# Patient Record
Sex: Male | Born: 1935
Health system: Southern US, Community
[De-identification: ages and names within clinical notes are randomized; demographics above are authoritative.]

## PROBLEM LIST (undated history)

## (undated) DIAGNOSIS — M48061 Spinal stenosis, lumbar region without neurogenic claudication: Secondary | ICD-10-CM

## (undated) DIAGNOSIS — I499 Cardiac arrhythmia, unspecified: Secondary | ICD-10-CM

## (undated) DIAGNOSIS — I2510 Atherosclerotic heart disease of native coronary artery without angina pectoris: Secondary | ICD-10-CM

## (undated) DIAGNOSIS — Z8719 Personal history of other diseases of the digestive system: Secondary | ICD-10-CM

## (undated) DIAGNOSIS — I10 Essential (primary) hypertension: Secondary | ICD-10-CM

## (undated) DIAGNOSIS — F329 Major depressive disorder, single episode, unspecified: Secondary | ICD-10-CM

## (undated) DIAGNOSIS — G2581 Restless legs syndrome: Secondary | ICD-10-CM

## (undated) DIAGNOSIS — M1990 Unspecified osteoarthritis, unspecified site: Secondary | ICD-10-CM

## (undated) DIAGNOSIS — K219 Gastro-esophageal reflux disease without esophagitis: Secondary | ICD-10-CM

## (undated) DIAGNOSIS — E7800 Pure hypercholesterolemia, unspecified: Secondary | ICD-10-CM

## (undated) DIAGNOSIS — F32A Depression, unspecified: Secondary | ICD-10-CM

## (undated) DIAGNOSIS — T8859XA Other complications of anesthesia, initial encounter: Secondary | ICD-10-CM

## (undated) DIAGNOSIS — T4145XA Adverse effect of unspecified anesthetic, initial encounter: Secondary | ICD-10-CM

## (undated) HISTORY — DX: Spinal stenosis, lumbar region without neurogenic claudication: M48.061

## (undated) HISTORY — PX: COLONOSCOPY: SHX174

## (undated) HISTORY — DX: Restless legs syndrome: G25.81

## (undated) HISTORY — DX: Pure hypercholesterolemia, unspecified: E78.00

## (undated) HISTORY — DX: Essential (primary) hypertension: I10

## (undated) HISTORY — PX: BACK SURGERY: SHX140

## (undated) HISTORY — PX: EYE SURGERY: SHX253

## (undated) HISTORY — PX: TONSILLECTOMY: SUR1361

---

## 2000-02-16 ENCOUNTER — Ambulatory Visit (HOSPITAL_BASED_OUTPATIENT_CLINIC_OR_DEPARTMENT_OTHER): Admission: RE | Admit: 2000-02-16 | Discharge: 2000-02-16 | Payer: Self-pay | Admitting: Orthopedic Surgery

## 2000-03-03 ENCOUNTER — Ambulatory Visit (HOSPITAL_COMMUNITY): Admission: RE | Admit: 2000-03-03 | Discharge: 2000-03-03 | Payer: Self-pay | Admitting: Gastroenterology

## 2001-02-28 ENCOUNTER — Ambulatory Visit (HOSPITAL_BASED_OUTPATIENT_CLINIC_OR_DEPARTMENT_OTHER): Admission: RE | Admit: 2001-02-28 | Discharge: 2001-02-28 | Payer: Self-pay | Admitting: Orthopedic Surgery

## 2002-09-04 ENCOUNTER — Ambulatory Visit (HOSPITAL_COMMUNITY): Admission: RE | Admit: 2002-09-04 | Discharge: 2002-09-04 | Payer: Self-pay | Admitting: *Deleted

## 2003-11-05 ENCOUNTER — Inpatient Hospital Stay (HOSPITAL_COMMUNITY): Admission: RE | Admit: 2003-11-05 | Discharge: 2003-11-09 | Payer: Self-pay | Admitting: Orthopedic Surgery

## 2003-11-05 HISTORY — PX: REPLACEMENT TOTAL KNEE BILATERAL: SUR1225

## 2004-11-09 ENCOUNTER — Emergency Department (HOSPITAL_COMMUNITY): Admission: EM | Admit: 2004-11-09 | Discharge: 2004-11-10 | Payer: Self-pay | Admitting: Emergency Medicine

## 2006-09-10 ENCOUNTER — Encounter: Admission: RE | Admit: 2006-09-10 | Discharge: 2006-09-10 | Payer: Self-pay | Admitting: Geriatric Medicine

## 2006-12-08 ENCOUNTER — Encounter: Admission: RE | Admit: 2006-12-08 | Discharge: 2006-12-08 | Payer: Self-pay | Admitting: Geriatric Medicine

## 2008-03-23 HISTORY — PX: TOE AMPUTATION: SHX809

## 2009-08-22 ENCOUNTER — Ambulatory Visit (HOSPITAL_COMMUNITY): Admission: RE | Admit: 2009-08-22 | Discharge: 2009-08-22 | Payer: Self-pay | Admitting: Gastroenterology

## 2010-08-08 NOTE — Op Note (Signed)
NAME:  Ruben Barrera, Ruben Barrera                          ACCOUNT NO.:  [id number]   MEDICAL RECORD NO.:  [id number]                   PATIENT TYPE:  AMB   LOCATION:  ENDO                                 FACILITY:  [facility]   PHYSICIAN:  Danise Edge, M.D.                DATE OF BIRTH:  [birth year]   DATE OF PROCEDURE:  09/04/2002  DATE OF DISCHARGE:                                 OPERATIVE REPORT   PROCEDURE:  Esophagogastroduodenoscopy.   PROCEDURE INDICATION:  Ruben Barrera is a 77 year old male born September 29, 1932.  Ruben Barrera has chronic gastroesophageal reflux disease associated  with a benign peptic stricture at the esophagogastric junction, which  required esophageal dilation in the past.  Ruben Barrera has severe  osteoarthritis of his knees.  He underwent an esophagogastroduodenoscopy  associated with an arthritis drug trial.  His esophagogastroduodenoscopy  apparently revealed a distal esophageal ulcer.  He has been off nonsteroidal  anti-inflammatory medication and on a proton pump inhibitor for  approximately eight weeks.  He is scheduled to undergo a repeat  esophagogastroduodenoscopy to confirm esophageal ulcer healing.   ENDOSCOPIST:  Danise Edge, M.D.   PREMEDICATION:  Versed 7 mg, Demerol 50 mg.   PROCEDURE:  After obtaining informed consent, Ruben Barrera was placed in the  left lateral decubitus position.  I administered intravenous Demerol and  intravenous Versed to achieve conscious sedation for the procedure.  The  patient's blood pressure, oxygen saturation, and cardiac rhythm were  monitored throughout the procedure and documented in the medical record.   The Olympus gastroscope was passed through the posterior hypopharynx into  the proximal esophagus without difficulty.  The hypopharynx, larynx, and  vocal cords appeared normal.   Esophagoscopy:  The proximal and midsegment of the esophagus appear normal.  The squamocolumnar junction is noted at 40 cm from  the incisor teeth.  There  is a shallow Schatzki's ring at the esophagogastric junction.  Endoscopically there is no evidence for the presence of erosive esophagitis,  Barrett's esophagus, or esophageal ulcers.   Gastroscopy:  Ruben Barrera has a moderate-sized hiatal hernia.  Retroflexed  view of the gastric cardia and fundus reveals a patulous diaphragmatic  hiatus.  The gastric body, antrum, and pylorus appear normal.   Duodenoscopy:  The duodenal bulb and descending duodenum appear normal.    ASSESSMENT:  Gastroesophageal reflux disease associated with a shallow  Schatzki's ring at the esophagogastric junction and a hiatal hernia.  There  is no endoscopic evidence for the presence of Barrett's esophagus, erosive  esophagitis, or esophageal ulceration.                                               Danise Edge, M.D.    MJ/MEDQ  D:  09/04/2002  T:  09/04/2002  Job:  [id number]   cc:   Hal T. Stoneking, M.D.  [address] [place], [place] [address]  Fax: [phone]

## 2010-08-08 NOTE — H&P (Signed)
NAME:  Ruben Barrera, Ruben Barrera                          ACCOUNT NO.:  [id number]   MEDICAL RECORD NO.:  [id number]                   PATIENT TYPE:  INP   LOCATION:  NA                                   FACILITY:  [facility]   PHYSICIAN:  Ollen Gross, M.D.                 DATE OF BIRTH:  04-21-1932   DATE OF ADMISSION:  11/05/2003  DATE OF DISCHARGE:                                HISTORY & PHYSICAL   CHIEF COMPLAINT:  Bilateral knee pain.   HISTORY OF PRESENT ILLNESS:  Patient is a 77 year old male who is referred  over for evaluation for knee pain to Dr. Trudee Barrera via Dr. Leonides Grills.  Ruben Barrera has an extensive history of bilateral knee pain that has been  getting progressively worse over the past several years.  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  He is extensive busy during football season and does a  lot of traveling.  He currently works for [employer].  He is seen in the  office and found to have significant bone-on-bone changes with medium  patellofemoral compartments of both knees.  The right seems to be more  significant on x-rays than the left.  He does have significant varus  deformities with both knees.  He has been treated in the past with cortisone  injections and also Hyalgan injections but despite conservative measures, he  continues to have pain.  It is felt he has reached a point since he has  failed conservative measures and also has significant pain and findings on x-  rays, he would benefit from undergoing knee replacement.  He is an extremely  active, healthy 77 year old male.  It is felt that he would benefit from  undergoing bilateral knee replacements.  Risks and benefits of the procedure  have been discussed with the patient, and he elects to proceed with surgery.   ALLERGIES:  No known drug allergies.   CURRENT MEDICATIONS:  1. Altace 5 mg daily.  2. Hydrochlorothiazide 12.5 mg daily.   PAST MEDICAL HISTORY:  Hypertension.   PAST SURGICAL HISTORY:  Bilateral knee arthroscopies.   SOCIAL HISTORY:  Married.  Two children.  Denies the use of tobacco.  Approximately has 2-3 alcoholic drinks per week.   FAMILY HISTORY:  Father deceased at 77 with stroke.  Mother living at age 57  with arthritis.   REVIEW OF SYSTEMS:  GENERAL:  No fever, chills, or night sweats.  NEURO:  No  seizures, syncope, or paralysis.  RESPIRATORY:  No shortness of breath,  productive cough, or hemoptysis.  CARDIOVASCULAR:  No chest pain, angina,  orthopnea.  GI:  No nausea, vomiting, diarrhea, constipation.  GU:  No  dysuria, hematuria, or discharge.  MUSCULOSKELETAL:  Pertinent for that of  the  knees found in the history of present illness.   PHYSICAL EXAMINATION:  VITAL SIGNS:  Pulse 76, respirations 12, blood  pressure 114/76.  GENERAL:  Patient is a 77 year old white male who is well-developed and well-  nourished in no acute distress.  He is alert, oriented and cooperative.  Appears to be an excellent historian.  HEENT:  Normocephalic and atraumatic.  Pupils are round and reactive.  Oropharynx is clear.  EOMs are intact.  NECK:  Supple.  No carotid bruits.  CHEST:  Clear anterior and posterior chest wall.  No rales, rhonchi or  wheezes.  HEART:  Regular rate and rhythm.  No murmurs.  S1 and S2 noted.  No rubs,  thrills, or palpitations.  ABDOMEN:  Soft, flat, nontender.  Bowel sounds are present.  RECTAL/BREASTS/GENITALIA:  Not done.  Not pertinent to the present illness.  EXTREMITIES:  Significant for that to the right and left knee.  The right  knee shows a range of motion of 5-125 degrees.  No instability.  No  effusion.  Crepitus noted.  Left knee shows range of motion of 0-125  degrees.  No instability.  No effusion or crepitus is noted.   IMPRESSION:  1. Osteoarthritis, bilateral knees.  2. Mild  hypertension.   PLAN:  Patient will be admitted to [facility] to undergo bilateral  total knee replacement and arthroplasty.  The surgery will be performed by  Dr. Trudee Barrera.  Patient has been seen preoperatively by Dr. Sharlot Gowda  and felt there was no medical reason to delay his surgery.     Alexzandrew L. Julien Girt, P.A.              Ollen Gross, M.D.    ALP/MEDQ  D:  10/31/2003  T:  10/31/2003  Job:  [id number]   cc:   Sharlot Gowda, M.D.  [address]  [place], [place] [address]  Fax: [phone]   Hal T. Stoneking, M.D.  [address] [place], [place] [address]  Fax: [phone]

## 2010-08-08 NOTE — Op Note (Signed)
NAME:  Ruben Barrera, Ruben Barrera                          ACCOUNT NO.:  [id number]   MEDICAL RECORD NO.:  [id number]                   PATIENT TYPE:  INP   LOCATION:  X005                                 FACILITY:  [facility]   PHYSICIAN:  Ollen Gross, M.D.                 DATE OF BIRTH:  December 27, 1932   DATE OF PROCEDURE:  11/05/2003  DATE OF DISCHARGE:                                 OPERATIVE REPORT   PREOPERATIVE DIAGNOSIS:  Osteoarthritis bilateral knees.   POSTOPERATIVE DIAGNOSIS:  Osteoarthritis bilateral knees.   PROCEDURE:  Bilateral total knee arthroplasty.   SURGEON:  Gus Rankin. Aluisio, M.D.   ASSISTANT:  Avel Peace, PA-C   ANESTHESIA:  General and epidural.   ESTIMATED BLOOD LOSS:  400.   DRAIN:  Hemovac x 1each side.   TOURNIQUET TIME:  Right 45 minutes at 300 mmHg, left 45 minutes at 300 mmHg.   COMPLICATIONS:  None.   CONDITION:  Stable to recovery.   BRIEF CLINICAL NOTE:  [name] is a 77 year old male with severe end-  stage arthritis, both knees, right worse radiographically than the left but  both equal symptomatically.  He has failed nonoperative management and  requests to have both knees done at the same time.  He presents now for  bilateral total knee arthroplasty.   PROCEDURE IN DETAIL:  After the successful administration of general  anesthetic, Dr. Shireen Quan placed an epidural catheter and the patient  subsequently placed in the supine position and tourniquet is placed high on  both thighs and both lower extremities prepped and draped in the usual  sterile fashion.  Since the right side was worse radiographically, we  proceeded with the right side first, per the patient's request.  The right  lower extremity was wrapped in Esmarch, knee flexed, and tourniquet inflated  to 300 mmHg.  Midline incision is made with a 10 blade through subcutaneous  tissue to the level of the extensor mechanism.  Then a fresh blade is used  to make a medial parapatellar  arthrotomy.  Soft tissue over the proximal and  medial tibia is subperiosteally elevated to the joint line with a knife and  into the semimembranosus bursa with a curved osteotome.  Soft tissue over  the proximal and lateral tibia is also elevated with attention being paid to  avoiding the patellar tendon on a tibial tubercle.  Patella was everted,  knee flexed 90 degrees, and ACL and PCL removed.  Drill is used to create a  starting hole in the distal femur and the canal was irrigated.  Five-degree  right valgus alignment guide is placed and rotating off the posterior  condyles, rotation is marked and a block pinned to remove 10 mm off the  distal femur.  Distal femoral resection is made with an oscillating saw.  A  sizing block is placed, and size 4 is most appropriate.  Rotation is marked  off the epicondylar axis.  Then the anterior and posterior cutting block is  placed and the AP cuts made for a size 4.   Tibia is subluxed forward, and the menisci are removed.  Extramedullary  tibial alignment guide is placed referencing proximally off the medial  aspect of the tibial tubercle and distally along the second metatarsal axis  and tibial crest.  The block is pinned to remove 10 mm off the nondeficient  lateral side.  Tibial resection is made with an oscillating saw.  Sizing is  a size 4, and then the proximal tibia is prepared with the modular drill and  keel punch for a size 4.  Femoral preparation is completed with the  intercondylar and chamfer cuts.   A size 4 mobile bearing tibial trial, a size 4 posterior stabilized femoral  trial, and a 10 mm posterior stabilized rotating platform insert trial are  placed.  With the 10, full extension is achieved with excellent varus and  valgus balance throughout full range of motion.  The patella is then  everted, thickness measured to be 25 mm, free hand resection is taken to 14  mm, 41 template is placed, lug holes are drilled, trial patella  is placed,  and it tracks normally.  Osteophytes are then removed off the posterior  femur with the femoral trial in place.  All trials are removed, and the cut  bone surfaces are prepared with pulsatile  lavage.  Cement is mixed and once  ready for implantation, the size 4 mobile bearing tibial tray, size 4  posterior stabilized femur, and 41 patella are cemented into place.  Patella  is held with a clamp.  Trial 10 mm insert is placed and knee held in full  extension.  All extruded cement is removed.  Once the cement is fully  hardened, then the permanent 10 mm posterior stabilized rotating platform  insert is placed into the tibial tray.  The wound is copiously irrigated  with antibiotic solution and the extensor mechanism closed over a Hemovac  drain with interrupted #1 PDS.  Flexion against gravity is about 140  degrees.  Tourniquet is released for a total time of 45 minutes.  Subcu is  closed with interrupted 2-0 Vicryl.  I then placed moist sponges and  compressive wrap on the right knee and hooked the Hemovac up to suction.  We  changed gloves and then prepared to operate on the left knee.   The left lower extremity was wrapped in Esmarch, the knee flexed, the  tourniquet inflated to 300 mmHg.  Same incision and approach are made.  Same  soft tissue releases are performed.  ACL and PCL are removed.  Drill is used  to create a starting hole in the distal femur.  Canal is irrigated.  Five-  degree left valgus alignment guide is placed.  Then 10 mm taken off the  distal femur.  Sizing block is placed, and size 4 is most appropriate.  We  referenced off the epicondylar axis, placed the AP cutting block and made  the anterior and posterior cuts for the size 4.   Tibia is subluxed forward; menisci are removed, and the extramedullary  tibial alignment guide is placed referencing proximally at the medial aspect  of the tibial tubercle and distally along the second metatarsal axis  and tibial crest.  Again, we took 10 mm off the nondeficient lateral side.  Tibial resection is made with an oscillating saw, and size 4 was  the most  appropriate tibial component.  The proximal tibia is prepared with the  modular drill and keel punch for a size 4, and then femoral preparation is  completed with intercondylar and chamfer cuts for size 4.  Trial size 4  mobile bearing tibial tray with a trial size 4 posterior stabilized femur  and a 10 mm posterior stabilized rotating platform insert trial are placed.  Full extension is achieved with excellent varus and valgus balance  throughout full range of motion.  Patella is everted, thickness again  measured to be 25 mm, free hand resection taken to 14 mm.  A 41 template is  placed, and the lug holes are drilled and then subsequently the trial  patella is placed, and it tracks normally.  The osteophytes are removed off  the posterior femur with the trial in place.  All trials are removed, then  the cut bone surfaces prepared with pulsatile lavage.  Cement is mixed and  once ready for implantation, the size 4 posterior stabilized femur, size 4  mobile bearing tibial tray, and the 41 patella are cemented into place.  Patella is held with a clamp.  Trial 10 mm insert is placed, knee held in  full extension, all extruded cement removed.  Once the cement is fully  hardened, then the permanent 10 mm posterior stabilized rotating platform  insert is placed into the tibial tray.  Wound is copiously irrigated with  antibiotic solution and the extensor mechanism closed over a Hemovac drain  with interrupted #1 PDS.  Remainder of the closure is the same as the other  side.  Subcuticular  layers are closed on both sides with running 4-0 Monocryl.  Tourniquet time  on the left was also 45 minutes.  Once closed, then Steri-Strips and bulky  sterile dressings are applied, and drain is hooked to suction.  He is then  awakened, placed into knee  immobilizers, and transported to recovery in  stable condition.                                               Ollen Gross, M.D.    FA/MEDQ  D:  11/05/2003  T:  11/05/2003  Job:  [id number]

## 2010-08-08 NOTE — Op Note (Signed)
[place]  Patient:    Ruben Barrera, Ruben Barrera Visit Number: [id number] MRN: [id number]          Service Type: DSU Location: [facility] Attending Physician:  Twana First Dictated by:   Elana Alm Thurston Hole, M.D. Proc. Date: 02/28/01 Admit Date:  02/28/2001                             Operative Report  PREOPERATIVE DIAGNOSIS:  Left knee medial meniscus tear with degenerative joint disease.  POSTOPERATIVE DIAGNOSIS:  Left knee medial and lateral meniscal tears with degenerative joint disease.  PROCEDURES 1. Left knee examination under anesthesia, followed by arthroscopic partial    medial and lateral meniscectomies. 2. Left knee chondroplasty.  SURGEON:  Elana Alm. Thurston Hole, M.D.  ASSISTANT:  Julien Girt, P.A.  ANESTHESIA:  General.  OPERATIVE TIME:  30 minutes.  COMPLICATIONS:  None.  INDICATIONS:  Ruben Barrera is a 77 year old gentleman who has had significant left knee pain for the past nine to 12 months, increasing in nature, with signs and symptoms consistent with medial meniscus tearing and degenerative joint disease, who has failed conservative care, and is now to undergo an arthroscopy.  DESCRIPTION OF PROCEDURE:  Ruben Barrera was brought to the operating room on February 28, 2001, and placed on the operating room table in the supine position.  After an adequate level of general anesthesia was obtained, his left knee was examined under anesthesia.  He had a full range of motion in his knee.  It was stable to ligamentous exam with normal patella tracking.  The left knee was sterilely injected with 0.25% Marcaine with epinephrine.  The left leg was then prepped using sterile Betadine and draped using a sterile technique.  Initially through an inferolateral portal the arthroscope with a pump attached was placed, and through an inferior medial portal, the arthroscopic probe was placed.  On initial inspection of the medial compartment  he had 20% grade 4 and 50% grade 3 chondromalacia, which was thoroughly debrided, medial femoral condyle, medial tibial plateau.  The medial meniscus showed a tearing of the posterior and medial horn 50%, which was resected back to a stable rim.  The inner condylar notch was inspected. The anterior and posterior cruciate ligaments were normal.  The lateral compartment was inspected.  The articular cartilage, the lateral femoral condyle, and lateral tibial plateau showed only mild grade 1-2 chondromalacia. The lateral meniscus was probed.  He had tearing of 15% of the inner surface of the posterior and lateral horn, which was resected back to a stable rim. The patellofemoral joint showed 25% grade 3 chondromalacia which was debrided. The patella tracked normally.  Moderate synovitis in the medial and lateral gutters was debrided.  Otherwise they were free of pathology.  After this was done, it was felt that all pathology had been satisfactorily addressed.  The instruments were removed.  The portals were closed with #3-0 nylon suture and injected with 0.25% Marcaine with epinephrine and 4 mg of morphine.  Sterile dressings were applied.  The patient was awakened and taken to the recovery room in stable condition.  FOLLOW-UP CARE:  Ruben Barrera will be followed as an outpatient on Vidocin and Naprosyn.  I will see him back in the office in one week for sutures out and followup.Dictated by:   Elana Alm Thurston Hole, M.D. Attending Physician:  Twana First DD:  02/28/01 TD:  02/28/01 Job: [id number]

## 2010-08-08 NOTE — Discharge Summary (Signed)
[name], REASONS NO.:  [id number]   MEDICAL RECORD NO.:  [id number]          PATIENT TYPE:  INP   LOCATION:  0463                         FACILITY:  [facility]   PHYSICIAN:  Ollen Gross, M.D.    DATE OF BIRTH:  06-22-1932   DATE OF ADMISSION:  11/05/2003  DATE OF DISCHARGE:  11/09/2003                                 DISCHARGE SUMMARY   ADMISSION DIAGNOSES:  1.  Osteoarthritis, bilateral knees.  2.  Hypertension.   DISCHARGE DIAGNOSES:  1.  Osteoarthritis, bilateral knees, status post bilateral total knee      arthroplasty.  2.  Postoperative blood loss anemia, did not require transfusion.  3.  Postoperative hypokalemia, improved.  4.  Postoperative hyponatremia, improved.  5.  Hypertension.   PROCEDURE:  On November 05, 2003, a bilateral total knee arthroplasty.   SURGEON:  Ollen Gross, M.D.   ASSISTANT:  Alexzandrew L. Perkins, P.A.-C.   ANESTHESIA:  General with postop epidural.   BLOOD LOSS:  400 cc.   DRAINS:  Hemovac drain x1 on both sides.   TOURNIQUET TIME:  On the right leg, 45 minutes at 300 mmHg.  On the left leg  at 45 minutes, 300 mmHg.   BRIEF HISTORY:  [name] is a 77 year old male with severe end-stage  arthritis of both knees.  The right is worse radiographically than the left,  but both are symptomatic.  He has failed nonoperative management and now  presents for bilateral total knees.   CONSULTS:  Rehab services, Dr. Hermelinda Medicus.   LABORATORY DATA:  CBC on admission:  Hemoglobin 16.3, hematocrit 47.7, white  cell count 5.3, differential within normal limits.  Postop H&H 12.1 and  35.0, dropped down to 9.7 and 28.1.  Last H&H 9.3 and 27.0.  PT/PTT preop  12.4 and 34, respectively with an INR of 0.9.  Serial pro times followed.  Last noted PT/INR of 19.4 and 2.0.  Chem panel on admission all within  normal limits.  Serial BMETs are followed.  Potassium dropped down from 4.2  to 3.2, back up to 3.8.  Sodium dropped from 136  to 133, back up to 136.  Urinalysis:  Positive protein, rare bacteria, 0-2 white cells, otherwise  negative.  Blood group type A+.   EKG dated August, 2005:  Normal sinus rhythm.  Normal EKG.  Confirmed by Dr.  Viann Fish, Montez Hageman.   Chest x-ray, two view, on October 31, 2003:  No evidence of active chest  disease.   Abdominal film, one time, on November 08, 2003:  Bowel gas pattern, most  consistent with generalized ileus pattern.   HOSPITAL COURSE:  The patient was admitted to [facility] and was  taken to the OR.  Underwent the above-stated procedure.  No complications.  Patient tolerated the procedure well and later went to the recovery room and  to the orthopedic floor for continued postoperative care.  Patient had an  epidural placed in postop.  Did have a little bit of breakthrough pain.  The  epidural was adjusted by anesthesia.  Started Coumadin on the night  of  postop day #1.  Both Hemovacs were pulled on day #1 without difficulty.  By  day #2, the patient is doing much better.  The epidural was removed on the  morning of day #2 and was started on p.o. and PCA analgesics.   Foley catheter was removed on day #2 and started on Flomax, Valium.  The  Robaxin was DC'd.  Started on p.o. and IV analgesics.  Started to get up  with physical therapy.  Dressing was changed on day 2, and the incision was  healing well.   By day #3, the patient had a little bit more discomfort.  Did have a bowel  movement on the evening of day #2 and started to void a little bit.  The  main complaint was a dry mouth and some abdominal cramping.  KUB was  ordered, and just showed some gas pattern, indicative of some mild ileus,  but he was moving his bowels, just not much appetite.  Encouraged his  mobility.  The Flomax was discontinued since he was voiding.  Continued to  progress and work hard with physical therapy.  He actually did fairly well  and was up ambulating approximately 80 feet with  physical therapy.  By day  #4, he was doing a little bit better with his pain control.  The bloating in  his abdomen, which he had experienced postop was starting to improve.  He  was getting up a little bit more.  It was decided that he was ready to go  home.   DISCHARGE PLAN:  1.  Patient was discharged home on November 09, 2003.  2.  Discharge diagnoses:  Please see above.  3.  Discharge meds:  Percocet, Robaxin, Coumadin.  4.  Diet:  A low sodium diet.  5.  Activity:  Weightbearing as tolerated.  Home health PT/OT and home      health nursing.  Total knee protocol bilaterally.  6.  Follow up in two weeks from surgery.   DISPOSITION:  Home.   CONDITION ON DISCHARGE:  Improved.      ALP/MEDQ  D:  12/25/2003  T:  12/25/2003  Job:  [id number]   cc:   Sharlot Gowda, M.D.  [address]  [place], [place] [address]  Fax: [phone]   Hal T. Stoneking, M.D.  [address] [place], [place] [address]  Fax: [phone]

## 2010-08-08 NOTE — Op Note (Signed)
[facility]  Patient:    Ruben Barrera, Ruben Barrera                       MRN: [id number] Proc. Date: 02/16/00 Adm. Date:  [id number] Disc. Date: [id number] Attending:  Dennison Bulla Ii                           Operative Report  PREOPERATIVE DIAGNOSES: 1. 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 2. Left shoulder rotator cuff tendonitis.  POSTOPERATIVE DIAGNOSES: 1. 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 2. Left shoulder rotator cuff tendonitis.  PROCEDURE: 1. Right knee EUA followed by arthroscopic partial medial meniscectomy. 2. Right knee chondroplasty. 3. Left shoulder subacromial injection.  SURGEON:  Elana Alm. Thurston Hole, M.D.  ASSISTANT:  Julien Girt, P.A.  ANESTHESIA:  Local and MAC.  OPERATIVE TIME:  45 minutes.  COMPLICATIONS:  None.  INDICATIONS FOR PROCEDURE:  [name] is a 77 year old gentleman who has had significant right knee pain and left shoulder pain for the past three to four months increasing in nature, with signs and symptoms consistent with right knee medial meniscus tear and chondromalacia and left shoulder rotator cuff tendonitis.  He has failed conservative care and is now to undergo right knee arthroscopy and left shoulder injection.  DESCRIPTION:  [name] was brought to the operating room on February 16, 2000 after a block had been placed in the holding room.  Placed on the operative table in a supine position.  Right knee was examined under anesthesia.  Range of motion 0-125 degrees, 1-2+ crepitation, knee stable ligamentous exam.  Left shoulder underwent a subacromial injection under sterile conditions with alcohol prep with 1 cc of Aristospan and 3 cc of Marcaine in the subacromial space.  The right leg was then prepped using sterile Betadine and draped using sterile technique.  Originally, through an anterolateral portal the arthroscope with a pump attached was placed  and through an inferomedial portal an arthroscopic probe was placed.  On initial inspection of the medial compartment, the articular cartilage on the medial femoral condyle and medial tibial plateau showed 50-60% grade 3 and 20% grade 4 changes.  This was thoroughly debrided.  The medial meniscus showed tearing of the posterior and medial horn and 50-60% was resected back to a stable rim. Intercondylar notch inspected, anterior and posterior cruciate ligaments were normal.  Lateral compartment inspected, articular cartilage, lateral femoral condyle, lateral tibial plateau showed only mild grade 1 and 2 chondromalacia.  lateral meniscus was intact.  Patellofemoral joint showed mild grade 1 and 2 chondromalacia.  The patella tracked normally.  Moderate synovitis in the medial and lateral gutters were debrided; otherwise, they are free of pathology.  After this was done it was felt that all pathology had been satisfactorily addressed.  The instruments were removed.  The portals were closed with 3-0 nylon suture and injected with 0.25% Marcaine with epinephrine and morphine 4 mg.  Sterile dressings applied and the patient awakened and taken to the recovery room in stable condition.  FOLLOW-UP CARE:  [name] will be followed as an outpatient on Vicodan and Naprosyn.  See him back in the office in a week for sutures out and follow-up. DD:  05/17/00 TD:  05/17/00 Job: [id number]

## 2011-03-04 ENCOUNTER — Other Ambulatory Visit: Payer: Self-pay | Admitting: Geriatric Medicine

## 2011-03-04 DIAGNOSIS — M549 Dorsalgia, unspecified: Secondary | ICD-10-CM

## 2011-03-07 ENCOUNTER — Other Ambulatory Visit: Payer: Self-pay

## 2011-03-10 ENCOUNTER — Ambulatory Visit
Admission: RE | Admit: 2011-03-10 | Discharge: 2011-03-10 | Disposition: A | Discharge status: Home / Self Care | Payer: Medicare Other | Source: Ambulatory Visit | Attending: Geriatric Medicine | Admitting: Geriatric Medicine

## 2011-03-10 DIAGNOSIS — M549 Dorsalgia, unspecified: Secondary | ICD-10-CM

## 2011-05-04 ENCOUNTER — Ambulatory Visit: Payer: Medicare Other | Admitting: Sports Medicine

## 2011-05-05 ENCOUNTER — Ambulatory Visit (INDEPENDENT_AMBULATORY_CARE_PROVIDER_SITE_OTHER): Payer: Medicare Other | Admitting: Sports Medicine

## 2011-05-05 VITALS — BP 135/88 | HR 102

## 2011-05-05 DIAGNOSIS — M25519 Pain in unspecified shoulder: Secondary | ICD-10-CM

## 2011-05-05 DIAGNOSIS — M25512 Pain in left shoulder: Secondary | ICD-10-CM | POA: Insufficient documentation

## 2011-05-05 NOTE — Patient Instructions (Signed)
Great to see you Ruben Barrera,  You have rotator cuff impingement syndrome. Please do the rehabilitation exercises as we discussed. Continue your meloxicam as needed. We can see you back at any time.   Ihor Austin. [name], M.D. [facility] [address] [place] [address] [phone]

## 2011-05-05 NOTE — Progress Notes (Signed)
  Subjective:    Patient ID: Ruben Barrera, male    DOB: Jul 21, 1932, 78 y.o.   MRN: [id number]  HPI This patient comes in with a chronic history of left shoulder pain, that he localizes over the deltoid, is worse with abduction, and overhead activities. He's had cortisone injections into the shoulder decades ago, which provided him with a good amount of relief. More recently he's been having lumbar epidural selective nerve root injections, the most recent of which was several weeks ago, and provided him with some relief in his shoulder as well. His concern today is how to prevent recurrence of this issue.  Past medical history:hypertension, hyperlipidemia, restless leg syndrome. Past surgical history: None. Family history: Negative for diabetes, positive for heart disease, positive for hypertension. 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 Allergies: None. Indications: Benazepril/hydrochlorothiazide, Mobic as needed, Crestor.  Review of Systems    No fevers, chills, night sweats, weight loss, chest pain, or shortness of breath.  Social History: Non-smoker. Objective:   Physical Exam General:  Well developed, well nourished, and in no acute distress. Neuro:  Alert and oriented x3, extra-ocular muscles intact. Skin: Warm and dry, no rashes noted. Respiratory:  Not using accessory muscles, speaking in full sentences. Musculoskeletal: Left Shoulder: Inspection reveals no abnormalities, atrophy or asymmetry. Palpation is normal with no tenderness over AC joint or bicipital groove. ROM is full in all planes. Rotator cuff strength normal throughout. Positive Neer, positive Hawkins, positive empty can sign. Speeds and Yergason's tests normal. No labral pathology noted with negative Obrien's, negative clunk and good stability. Normal scapular function observed. No painful arc and no drop arm sign. No apprehension sign       Assessment & Plan:

## 2011-05-05 NOTE — Assessment & Plan Note (Addendum)
Symptoms are likely related to left shoulder impingement syndrome. He may continue Mobic that he already has. I've given him a set of rotator cuff rehabilitation exercises. I do not think he needs another injection today, however this is available should he desire in the future. We can see him back on an as-needed basis for this. If he returns, and is still symptomatic, I would ultrasound his shoulder, and offer an injection.

## 2013-10-18 ENCOUNTER — Other Ambulatory Visit (HOSPITAL_COMMUNITY): Payer: Self-pay | Admitting: Orthopedic Surgery

## 2013-10-18 DIAGNOSIS — M25562 Pain in left knee: Secondary | ICD-10-CM

## 2013-10-25 ENCOUNTER — Encounter (HOSPITAL_COMMUNITY): Payer: Medicare Other

## 2013-10-25 ENCOUNTER — Ambulatory Visit (HOSPITAL_COMMUNITY): Payer: Medicare Other

## 2013-11-03 ENCOUNTER — Ambulatory Visit (HOSPITAL_COMMUNITY)
Admission: RE | Admit: 2013-11-03 | Discharge: 2013-11-03 | Disposition: A | Discharge status: Home / Self Care | Payer: Medicare Other | Source: Ambulatory Visit | Attending: Diagnostic Radiology | Admitting: Diagnostic Radiology

## 2013-11-03 ENCOUNTER — Ambulatory Visit (HOSPITAL_COMMUNITY)
Admission: RE | Admit: 2013-11-03 | Discharge: 2013-11-03 | Disposition: A | Discharge status: Home / Self Care | Payer: Medicare Other | Source: Ambulatory Visit | Attending: Orthopedic Surgery | Admitting: Orthopedic Surgery

## 2013-11-03 DIAGNOSIS — M25569 Pain in unspecified knee: Secondary | ICD-10-CM | POA: Insufficient documentation

## 2013-11-03 DIAGNOSIS — T8489XA Other specified complication of internal orthopedic prosthetic devices, implants and grafts, initial encounter: Secondary | ICD-10-CM | POA: Insufficient documentation

## 2013-11-03 DIAGNOSIS — Y831 Surgical operation with implant of artificial internal device as the cause of abnormal reaction of the patient, or of later complication, without mention of misadventure at the time of the procedure: Secondary | ICD-10-CM | POA: Insufficient documentation

## 2013-11-03 DIAGNOSIS — Y929 Unspecified place or not applicable: Secondary | ICD-10-CM | POA: Insufficient documentation

## 2013-11-03 DIAGNOSIS — M25562 Pain in left knee: Secondary | ICD-10-CM

## 2013-11-03 MED ORDER — TECHNETIUM TC 99M MEDRONATE IV KIT
25.9000 | PACK | Freq: Once | INTRAVENOUS | Status: AC | PRN
  Administered 2013-11-03: 25.9 via INTRAVENOUS

## 2014-04-27 ENCOUNTER — Encounter (HOSPITAL_COMMUNITY): Payer: Self-pay

## 2014-05-01 ENCOUNTER — Encounter: Payer: Self-pay | Admitting: Cardiology

## 2014-05-01 ENCOUNTER — Ambulatory Visit (INDEPENDENT_AMBULATORY_CARE_PROVIDER_SITE_OTHER): Payer: PPO | Admitting: Cardiology

## 2014-05-01 VITALS — BP 124/82 | HR 80 | Ht 69.0 in | Wt 172.0 lb

## 2014-05-01 DIAGNOSIS — I10 Essential (primary) hypertension: Secondary | ICD-10-CM | POA: Insufficient documentation

## 2014-05-01 DIAGNOSIS — R0600 Dyspnea, unspecified: Secondary | ICD-10-CM | POA: Insufficient documentation

## 2014-05-01 DIAGNOSIS — R0609 Other forms of dyspnea: Secondary | ICD-10-CM | POA: Insufficient documentation

## 2014-05-01 DIAGNOSIS — R079 Chest pain, unspecified: Secondary | ICD-10-CM

## 2014-05-01 NOTE — Patient Instructions (Signed)
Your physician recommends that you continue on your current medications as directed. Please refer to the Current Medication list given to you today.   Your physician has requested that you have a lexiscan myoview. For further information please visit [email or web address]. Please follow instruction sheet, as given.   Your physician wants you to follow-up in: ONE YEAR WITH DR NELSON You will receive a reminder letter in the mail two months in advance. If you don't receive a letter, please call our office to schedule the follow-up appointment.  

## 2014-05-01 NOTE — Progress Notes (Signed)
Patient ID: Ruben Barrera, male   DOB: 07/10/1932, 81 y.o.   MRN: [id number]    Patient Name: Ruben Barrera Date of Encounter: 05/01/2014  Primary Care Provider:  Mathews Argyle, MD Primary Cardiologist: Dorothy Spark   Problem List   Past Medical History  Diagnosis Date  . Lumbar spinal stenosis   . RLS (restless legs syndrome)   . Hypertension   . Hypercholesteremia    No past surgical history on file.  Allergies  Allergies  Allergen Reactions  . Hctz [Hydrochlorothiazide] Anaphylaxis  . Simvastatin     Leg pains    HPI  Ruben Barrera is a very pleasant 81 year old gentleman who was referred to [place] for recurrent dyspnea on exertion. The patient has history of hypertension and has been active his whole life. 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 He spends 1 hour day on a treadmill and also lifts weights. He in the last couple weeks experience at least 3 episodes of exertional dyspnea while walking uphill and cleaning snow. He states that lately while walking on a treadmill he has been experiencing more of indigestion and has been belching more. He occasionally smokes cigars but has never smoked on a daily basis. Both his parents died of stroke at age of 65 and 60. His siblings are younger and healthy no history of premature coronary artery disease in his family. He denies palpitations or syncope. He is followed by his primary care physician for hyperlipidemia currently taking pravastatin 20 mg daily and states that his lipids in December's were all within normal limit.  Home Medications  Prior to Admission medications   Medication Sig Start Date End Date Taking? Authorizing Provider  amLODipine (NORVASC) 10 MG tablet Take 10 mg by mouth daily.   Yes Historical Provider, MD  aspirin 81 MG tablet Take 81 mg by mouth daily.   Yes Historical Provider, MD  benazepril (LOTENSIN) 20 MG tablet Take 20 mg by mouth daily.    Yes Historical Provider, MD  celecoxib (CELEBREX) 200 MG capsule Take 200 mg by mouth 2 (two) times daily.   Yes Historical Provider, MD  fluticasone (FLONASE) 50 MCG/ACT nasal spray Place into both nostrils daily.   Yes Historical Provider, MD  loratadine (CLARITIN) 10 MG tablet Take 10 mg by mouth daily.   Yes Historical Provider, MD  LORazepam (ATIVAN) 1 MG tablet Take 0.5 mg by mouth daily as needed for anxiety.   Yes Historical Provider, MD  MULTIPLE VITAMIN PO Take by mouth.   Yes Historical Provider, MD  omeprazole (PRILOSEC) 20 MG capsule Take 20 mg by mouth daily.   Yes Historical Provider, MD  pravastatin (PRAVACHOL) 20 MG tablet Take 20 mg by mouth daily.   Yes Historical Provider, MD  tamsulosin (FLOMAX) 0.4 MG CAPS capsule Take 0.4 mg by mouth.   Yes Historical Provider, MD  traMADol (ULTRAM) 50 MG tablet Take 100 mg by mouth every 6 (six) hours as needed.   Yes Historical Provider, MD  zolpidem (AMBIEN) 10 MG tablet Take 5 mg by mouth at bedtime as needed for sleep.   Yes Historical Provider, MD   Family History  Family History  Problem Relation Age of Onset  . Stroke Mother   . Hypertension      family history    Social History  History   Social History  . Marital Status: Married    Spouse Name: N/A  Number of Children: N/A  . Years of Education: N/A   Occupational History  . Not on file.   Social History Main Topics  . Smoking status: Former Research scientist (life sciences)  . Smokeless tobacco: Never Used     Comment: quit 1972  . Alcohol Use: 0.0 oz/week    0 Not specified per week     Comment: occasionally  . Drug Use: No  . Sexual Activity: Not on file   Other Topics Concern  . Not on file   Social History Narrative  . No narrative on file     Review of Systems, as per HPI, otherwise negative General:  No chills, fever, night sweats or weight changes.  Cardiovascular:  No chest pain, dyspnea on exertion, edema, orthopnea, palpitations, paroxysmal nocturnal  dyspnea. Dermatological: No rash, lesions/masses Respiratory: No cough, dyspnea Urologic: No hematuria, dysuria Abdominal:   No nausea, vomiting, diarrhea, bright red blood per rectum, melena, or hematemesis Neurologic:  No visual changes, wkns, changes in mental status. All other systems reviewed and are otherwise negative except as noted above.  Physical Exam  There were no vitals taken for this visit.  General: Pleasant, NAD Psych: Normal affect. Neuro: Alert and oriented X 3. Moves all extremities spontaneously. HEENT: Normal  Neck: Supple without bruits or JVD. Lungs:  Resp regular and unlabored, CTA. Heart: RRR no s3, s4, or murmurs. Abdomen: Soft, non-tender, non-distended, BS + x 4.  Extremities: No clubbing, cyanosis or edema. DP/PT/Radials 2+ and equal bilaterally.  Labs:  No results for input(s): CKTOTAL, CKMB, TROPONINI in the last 72 hours. No results found for: WBC, HGB, HCT, MCV, PLT  No results found for: DDIMER Invalid input(s): POCBNP No results found for: NA, K, CL, CO2, GLUCOSE, BUN, CREATININE, CALCIUM, PROT, ALBUMIN, AST, ALT, ALKPHOS, BILITOT, GFRNONAA, GFRAA No results found for: CHOL, HDL, LDLCALC, TRIG  Accessory Clinical Findings  Echocardiogram - none  ECG - sinus rhythm, 80 bpm, left axis deviation, left anterior fascicular block.    Assessment & Plan  81 year old gentleman  1. Dyspnea on exertion - risk factors include hypertension, hyperlipidemia, male sex and age. In fact his GI symptoms during exercise might possibly be anginal equivalents. We will schedule an exercise nuclear stress test to rule out possible ischemia.  2. Hypertension- controlled on current regimen  Follow-up in one year unless abnormal stress test.    Dorothy Spark, MD, [facility] 05/01/2014, 10:46 AM

## 2014-05-09 ENCOUNTER — Ambulatory Visit (HOSPITAL_COMMUNITY): Payer: PPO | Attending: Cardiology | Admitting: Radiology

## 2014-05-09 DIAGNOSIS — R002 Palpitations: Secondary | ICD-10-CM | POA: Diagnosis not present

## 2014-05-09 DIAGNOSIS — R079 Chest pain, unspecified: Secondary | ICD-10-CM

## 2014-05-09 DIAGNOSIS — I10 Essential (primary) hypertension: Secondary | ICD-10-CM | POA: Insufficient documentation

## 2014-05-09 DIAGNOSIS — R0600 Dyspnea, unspecified: Secondary | ICD-10-CM

## 2014-05-09 DIAGNOSIS — R0609 Other forms of dyspnea: Secondary | ICD-10-CM | POA: Diagnosis not present

## 2014-05-09 MED ORDER — TECHNETIUM TC 99M SESTAMIBI GENERIC - CARDIOLITE
10.0000 | Freq: Once | INTRAVENOUS | Status: AC | PRN
  Administered 2014-05-09: 10 via INTRAVENOUS

## 2014-05-09 MED ORDER — TECHNETIUM TC 99M SESTAMIBI GENERIC - CARDIOLITE
30.0000 | Freq: Once | INTRAVENOUS | Status: AC | PRN
  Administered 2014-05-09: 30 via INTRAVENOUS

## 2014-05-09 NOTE — Progress Notes (Signed)
[facility] [place], [place] [address] [phone]    Cardiology Nuclear Med Study  Ruben Barrera is a 81 y.o. male     MRN : [id number]     DOB: 11-14-1932  Procedure Date: 05/09/2014  Nuclear Med Background Indication for Stress Test:  Evaluation for Ischemia History:  No Cardiac History Cardiac Risk Factors: Hypertension  Symptoms:  DOE and Palpitations   Nuclear Pre-Procedure Caffeine/Decaff Intake:  None> 12 hrs NPO After: 10:00pm   Lungs:  clear O2 Sat: 98% on room air. IV 0.9% NS with Angio Cath:  22g  IV Site: R Hand x 1, tolerated well IV Started by:  Irven Baltimore, RN  Chest Size (in):  40 Cup Size: n/a  Height: 5\' 9"  (1.753 m)  Weight:  169 lb (76.658 kg)  BMI:  Body mass index is 24.95 kg/(m^2). Tech Comments:  N/A    Nuclear Med Study 1 or 2 day study: 1 day  Stress Test Type:  Stress  Reading MD: N/A  Order Authorizing Provider:  Filiberto Pinks  Resting Radionuclide: Technetium 72m Sestamibi  Resting Radionuclide Dose: 11.0 mCi   Stress Radionuclide:  Technetium 60m Sestamibi  Stress Radionuclide Dose: 33.0 mCi           Stress Protocol Rest HR: 71 Stress HR: 133  Rest BP: 147/84 Stress BP: 179/75  Exercise Time (min): 6:00 METS: 7.0   Predicted Max HR: 141 bpm % Max HR: 94.33 bpm Rate Pressure Product: 23807   Dose of Adenosine (mg):  n/a Dose of Lexiscan: n/a mg  Dose of Atropine (mg): n/a Dose of Dobutamine: n/a mcg/kg/min (at max HR)  Stress Test Technologist: Crissie Figures, RN  Nuclear Technologist:  Earl Many, CNMT     Rest Procedure:  Myocardial perfusion imaging was performed at rest 45 minutes following the intravenous administration of Technetium 75m Sestamibi. Rest ECG: Normal sinus rhythm, 71 with frequent PACs. Nonspecific ST-T wave changes noted.  Stress Procedure:  The patient exercised on the treadmill utilizing the Bruce Protocol for 6:00 minutes. The patient stopped due to  dyspnea and denied any chest pain.  Technetium 47m Sestamibi was injected at peak exercise and myocardial perfusion imaging was performed after a brief delay. Stress ECG: No significant change from baseline ECG  QPS Raw Data Images:  Mild diaphragmatic attenuation.  Normal left ventricular size. Arms were in downward position creating artifact. Stress Images:  There is decreased uptake seen along the basal inferior as well as mid inferolateral wall distribution at rest and stress with no ischemia noted. Otherwise, homogeneous radiotracer uptake. Rest Images:  As above Subtraction (SDS):  No evidence of ischemia. Transient Ischemic Dilatation (Normal <1.22):  0.91 Lung/Heart Ratio (Normal <0.45):  0.40  Quantitative Gated Spect Images QGS EDV:  112 ml QGS ESV:  56 ml  Impression Exercise Capacity:  Fair exercise capacity. BP Response:  Normal blood pressure response. Clinical Symptoms:  No chest pain, positive dyspnea. ECG Impression:  No significant ST segment change suggestive of ischemia. Comparison with Prior Nuclear Study: No images to compare  Overall Impression:  Low risk stress nuclear study With no areas of ischemia identified. Sensitivity of study reduced by arm attenuation, especially in the inferior/inferolateral region..  LV Ejection Fraction: 50%.  LV Wall Motion:  NL LV Function; NL Wall Motion  [name], MD

## 2014-05-17 ENCOUNTER — Ambulatory Visit: Payer: Self-pay | Admitting: Cardiovascular Disease

## 2015-03-29 DIAGNOSIS — Z79899 Other long term (current) drug therapy: Secondary | ICD-10-CM | POA: Diagnosis not present

## 2015-03-29 DIAGNOSIS — M25511 Pain in right shoulder: Secondary | ICD-10-CM | POA: Diagnosis not present

## 2015-03-29 DIAGNOSIS — I10 Essential (primary) hypertension: Secondary | ICD-10-CM | POA: Diagnosis not present

## 2015-03-29 DIAGNOSIS — Z0000 Encounter for general adult medical examination without abnormal findings: Secondary | ICD-10-CM | POA: Diagnosis not present

## 2015-03-29 DIAGNOSIS — K219 Gastro-esophageal reflux disease without esophagitis: Secondary | ICD-10-CM | POA: Diagnosis not present

## 2015-03-29 DIAGNOSIS — G2581 Restless legs syndrome: Secondary | ICD-10-CM | POA: Diagnosis not present

## 2015-03-29 DIAGNOSIS — K5900 Constipation, unspecified: Secondary | ICD-10-CM | POA: Diagnosis not present

## 2015-03-29 DIAGNOSIS — M5430 Sciatica, unspecified side: Secondary | ICD-10-CM | POA: Diagnosis not present

## 2015-03-29 DIAGNOSIS — Z1389 Encounter for screening for other disorder: Secondary | ICD-10-CM | POA: Diagnosis not present

## 2015-03-29 DIAGNOSIS — E7800 Pure hypercholesterolemia, unspecified: Secondary | ICD-10-CM | POA: Diagnosis not present

## 2015-04-09 DIAGNOSIS — R739 Hyperglycemia, unspecified: Secondary | ICD-10-CM | POA: Diagnosis not present

## 2015-04-28 DIAGNOSIS — R252 Cramp and spasm: Secondary | ICD-10-CM | POA: Diagnosis not present

## 2015-04-28 DIAGNOSIS — J00 Acute nasopharyngitis [common cold]: Secondary | ICD-10-CM | POA: Diagnosis not present

## 2015-04-29 DIAGNOSIS — J00 Acute nasopharyngitis [common cold]: Secondary | ICD-10-CM | POA: Diagnosis not present

## 2015-04-29 DIAGNOSIS — R252 Cramp and spasm: Secondary | ICD-10-CM | POA: Diagnosis not present

## 2015-04-30 DIAGNOSIS — R252 Cramp and spasm: Secondary | ICD-10-CM | POA: Diagnosis not present

## 2015-04-30 DIAGNOSIS — J00 Acute nasopharyngitis [common cold]: Secondary | ICD-10-CM | POA: Diagnosis not present

## 2015-05-02 DIAGNOSIS — J00 Acute nasopharyngitis [common cold]: Secondary | ICD-10-CM | POA: Diagnosis not present

## 2015-05-02 DIAGNOSIS — R252 Cramp and spasm: Secondary | ICD-10-CM | POA: Diagnosis not present

## 2015-05-15 DIAGNOSIS — M1612 Unilateral primary osteoarthritis, left hip: Secondary | ICD-10-CM | POA: Diagnosis not present

## 2015-05-15 DIAGNOSIS — M5136 Other intervertebral disc degeneration, lumbar region: Secondary | ICD-10-CM | POA: Diagnosis not present

## 2015-05-21 DIAGNOSIS — M1612 Unilateral primary osteoarthritis, left hip: Secondary | ICD-10-CM | POA: Diagnosis not present

## 2015-06-17 DIAGNOSIS — E782 Mixed hyperlipidemia: Secondary | ICD-10-CM | POA: Diagnosis not present

## 2015-06-17 DIAGNOSIS — I10 Essential (primary) hypertension: Secondary | ICD-10-CM | POA: Diagnosis not present

## 2015-06-27 DIAGNOSIS — M5416 Radiculopathy, lumbar region: Secondary | ICD-10-CM | POA: Diagnosis not present

## 2015-07-04 DIAGNOSIS — M9903 Segmental and somatic dysfunction of lumbar region: Secondary | ICD-10-CM | POA: Diagnosis not present

## 2015-07-04 DIAGNOSIS — M5032 Other cervical disc degeneration, mid-cervical region, unspecified level: Secondary | ICD-10-CM | POA: Diagnosis not present

## 2015-07-04 DIAGNOSIS — M5135 Other intervertebral disc degeneration, thoracolumbar region: Secondary | ICD-10-CM | POA: Diagnosis not present

## 2015-07-04 DIAGNOSIS — M5137 Other intervertebral disc degeneration, lumbosacral region: Secondary | ICD-10-CM | POA: Diagnosis not present

## 2015-07-04 DIAGNOSIS — M9902 Segmental and somatic dysfunction of thoracic region: Secondary | ICD-10-CM | POA: Diagnosis not present

## 2015-07-04 DIAGNOSIS — M25519 Pain in unspecified shoulder: Secondary | ICD-10-CM | POA: Diagnosis not present

## 2015-07-04 DIAGNOSIS — M9901 Segmental and somatic dysfunction of cervical region: Secondary | ICD-10-CM | POA: Diagnosis not present

## 2015-07-08 DIAGNOSIS — H25013 Cortical age-related cataract, bilateral: Secondary | ICD-10-CM | POA: Diagnosis not present

## 2015-07-08 DIAGNOSIS — H2513 Age-related nuclear cataract, bilateral: Secondary | ICD-10-CM | POA: Diagnosis not present

## 2015-07-08 DIAGNOSIS — H5203 Hypermetropia, bilateral: Secondary | ICD-10-CM | POA: Diagnosis not present

## 2015-07-08 DIAGNOSIS — H524 Presbyopia: Secondary | ICD-10-CM | POA: Diagnosis not present

## 2015-07-23 DIAGNOSIS — M546 Pain in thoracic spine: Secondary | ICD-10-CM | POA: Diagnosis not present

## 2015-07-23 DIAGNOSIS — M5416 Radiculopathy, lumbar region: Secondary | ICD-10-CM | POA: Diagnosis not present

## 2015-07-23 DIAGNOSIS — M4316 Spondylolisthesis, lumbar region: Secondary | ICD-10-CM | POA: Diagnosis not present

## 2015-07-23 DIAGNOSIS — M4726 Other spondylosis with radiculopathy, lumbar region: Secondary | ICD-10-CM | POA: Diagnosis not present

## 2015-07-23 DIAGNOSIS — M5136 Other intervertebral disc degeneration, lumbar region: Secondary | ICD-10-CM | POA: Diagnosis not present

## 2015-07-23 DIAGNOSIS — M4155 Other secondary scoliosis, thoracolumbar region: Secondary | ICD-10-CM | POA: Diagnosis not present

## 2015-07-29 DIAGNOSIS — I10 Essential (primary) hypertension: Secondary | ICD-10-CM | POA: Diagnosis not present

## 2015-07-29 DIAGNOSIS — E7800 Pure hypercholesterolemia, unspecified: Secondary | ICD-10-CM | POA: Diagnosis not present

## 2015-07-29 DIAGNOSIS — R21 Rash and other nonspecific skin eruption: Secondary | ICD-10-CM | POA: Diagnosis not present

## 2015-07-31 DIAGNOSIS — D225 Melanocytic nevi of trunk: Secondary | ICD-10-CM | POA: Diagnosis not present

## 2015-07-31 DIAGNOSIS — L814 Other melanin hyperpigmentation: Secondary | ICD-10-CM | POA: Diagnosis not present

## 2015-07-31 DIAGNOSIS — L821 Other seborrheic keratosis: Secondary | ICD-10-CM | POA: Diagnosis not present

## 2015-07-31 DIAGNOSIS — Z85828 Personal history of other malignant neoplasm of skin: Secondary | ICD-10-CM | POA: Diagnosis not present

## 2015-07-31 DIAGNOSIS — L570 Actinic keratosis: Secondary | ICD-10-CM | POA: Diagnosis not present

## 2015-07-31 DIAGNOSIS — B351 Tinea unguium: Secondary | ICD-10-CM | POA: Diagnosis not present

## 2015-07-31 DIAGNOSIS — B354 Tinea corporis: Secondary | ICD-10-CM | POA: Diagnosis not present

## 2015-08-07 ENCOUNTER — Other Ambulatory Visit: Payer: Self-pay | Admitting: Neurosurgery

## 2015-08-07 DIAGNOSIS — M48062 Spinal stenosis, lumbar region with neurogenic claudication: Secondary | ICD-10-CM

## 2015-08-08 ENCOUNTER — Ambulatory Visit
Admission: RE | Admit: 2015-08-08 | Discharge: 2015-08-08 | Disposition: A | Discharge status: Home / Self Care | Payer: PPO | Source: Ambulatory Visit | Attending: Neurosurgery | Admitting: Neurosurgery

## 2015-08-08 DIAGNOSIS — M48062 Spinal stenosis, lumbar region with neurogenic claudication: Secondary | ICD-10-CM

## 2015-08-08 DIAGNOSIS — M5126 Other intervertebral disc displacement, lumbar region: Secondary | ICD-10-CM | POA: Diagnosis not present

## 2015-08-09 DIAGNOSIS — M4806 Spinal stenosis, lumbar region: Secondary | ICD-10-CM | POA: Diagnosis not present

## 2015-08-09 DIAGNOSIS — M4726 Other spondylosis with radiculopathy, lumbar region: Secondary | ICD-10-CM | POA: Diagnosis not present

## 2015-08-09 DIAGNOSIS — Z6825 Body mass index (BMI) 25.0-25.9, adult: Secondary | ICD-10-CM | POA: Diagnosis not present

## 2015-08-09 DIAGNOSIS — M5136 Other intervertebral disc degeneration, lumbar region: Secondary | ICD-10-CM | POA: Diagnosis not present

## 2015-08-13 ENCOUNTER — Other Ambulatory Visit: Payer: Self-pay | Admitting: Neurosurgery

## 2015-08-26 ENCOUNTER — Encounter (HOSPITAL_COMMUNITY)
Admission: RE | Admit: 2015-08-26 | Discharge: 2015-08-26 | Disposition: A | Discharge status: Home / Self Care | Payer: PPO | Source: Ambulatory Visit | Attending: Neurosurgery | Admitting: Neurosurgery

## 2015-08-26 ENCOUNTER — Encounter (HOSPITAL_COMMUNITY): Payer: Self-pay

## 2015-08-26 DIAGNOSIS — E7800 Pure hypercholesterolemia, unspecified: Secondary | ICD-10-CM | POA: Diagnosis not present

## 2015-08-26 DIAGNOSIS — Z79899 Other long term (current) drug therapy: Secondary | ICD-10-CM | POA: Diagnosis not present

## 2015-08-26 DIAGNOSIS — M5126 Other intervertebral disc displacement, lumbar region: Secondary | ICD-10-CM | POA: Diagnosis not present

## 2015-08-26 DIAGNOSIS — M47816 Spondylosis without myelopathy or radiculopathy, lumbar region: Secondary | ICD-10-CM | POA: Diagnosis not present

## 2015-08-26 DIAGNOSIS — M1990 Unspecified osteoarthritis, unspecified site: Secondary | ICD-10-CM | POA: Diagnosis not present

## 2015-08-26 DIAGNOSIS — Z96653 Presence of artificial knee joint, bilateral: Secondary | ICD-10-CM | POA: Diagnosis not present

## 2015-08-26 DIAGNOSIS — Z7982 Long term (current) use of aspirin: Secondary | ICD-10-CM | POA: Diagnosis not present

## 2015-08-26 DIAGNOSIS — Z888 Allergy status to other drugs, medicaments and biological substances status: Secondary | ICD-10-CM | POA: Diagnosis not present

## 2015-08-26 DIAGNOSIS — G2581 Restless legs syndrome: Secondary | ICD-10-CM | POA: Diagnosis not present

## 2015-08-26 DIAGNOSIS — F17290 Nicotine dependence, other tobacco product, uncomplicated: Secondary | ICD-10-CM | POA: Diagnosis not present

## 2015-08-26 DIAGNOSIS — K219 Gastro-esophageal reflux disease without esophagitis: Secondary | ICD-10-CM | POA: Diagnosis not present

## 2015-08-26 DIAGNOSIS — I10 Essential (primary) hypertension: Secondary | ICD-10-CM | POA: Diagnosis not present

## 2015-08-26 DIAGNOSIS — K449 Diaphragmatic hernia without obstruction or gangrene: Secondary | ICD-10-CM | POA: Diagnosis not present

## 2015-08-26 DIAGNOSIS — Z8249 Family history of ischemic heart disease and other diseases of the circulatory system: Secondary | ICD-10-CM | POA: Diagnosis not present

## 2015-08-26 DIAGNOSIS — M4806 Spinal stenosis, lumbar region: Secondary | ICD-10-CM | POA: Diagnosis not present

## 2015-08-26 HISTORY — DX: Cardiac arrhythmia, unspecified: I49.9

## 2015-08-26 HISTORY — DX: Other complications of anesthesia, initial encounter: T88.59XA

## 2015-08-26 HISTORY — DX: Gastro-esophageal reflux disease without esophagitis: K21.9

## 2015-08-26 HISTORY — DX: Adverse effect of unspecified anesthetic, initial encounter: T41.45XA

## 2015-08-26 HISTORY — DX: Personal history of other diseases of the digestive system: Z87.19

## 2015-08-26 HISTORY — DX: Unspecified osteoarthritis, unspecified site: M19.90

## 2015-08-26 LAB — CBC
HCT: 43.9 % (ref 39.0–52.0)
Hemoglobin: 15.1 g/dL (ref 13.0–17.0)
MCH: 30.9 pg (ref 26.0–34.0)
MCHC: 34.4 g/dL (ref 30.0–36.0)
MCV: 90 fL (ref 78.0–100.0)
Platelets: 242 10*3/uL (ref 150–400)
RBC: 4.88 MIL/uL (ref 4.22–5.81)
RDW: 13 % (ref 11.5–15.5)
WBC: 6.4 10*3/uL (ref 4.0–10.5)

## 2015-08-26 LAB — BASIC METABOLIC PANEL
Anion gap: 8 (ref 5–15)
BUN: 24 mg/dL — ABNORMAL HIGH (ref 6–20)
CO2: 25 mmol/L (ref 22–32)
Calcium: 9.2 mg/dL (ref 8.9–10.3)
Chloride: 104 mmol/L (ref 101–111)
Creatinine, Ser: 1.12 mg/dL (ref 0.61–1.24)
GFR calc Af Amer: >60 mL/min (ref >60)
GFR calc non Af Amer: >60 mL/min (ref >60)
Glucose, Bld: 118 mg/dL — ABNORMAL HIGH (ref 65–99)
Potassium: 4.3 mmol/L (ref 3.5–5.1)
Sodium: 137 mmol/L (ref 135–145)

## 2015-08-26 LAB — SURGICAL PCR SCREEN
MRSA, PCR: NEGATIVE
Staphylococcus aureus: NEGATIVE

## 2015-08-26 NOTE — Progress Notes (Signed)
PCP: Dr. Felipa Eth EKG: 08/26/15 Stress test 05/10/14, pt with no current cardiologist  Pt with no complaints of chest pain, shortness of breath.   Pt states he does not remember having an allergy to HCTZ as listed in chart.

## 2015-08-26 NOTE — Pre-Procedure Instructions (Signed)
    BERN MEHALIC  08/26/2015      [facility] - [place], [place] - [address] [place] [address] Phone: [phone] Fax: [phone]    Your procedure is scheduled on Wednesday, August 28, 2015 at 12:37   Report to [place] at 0930 A.M.   Call this number if you have problems the morning of surgery:  [phone]   Remember:  Do not eat food or drink liquids after midnight.   Take these medicines the morning of surgery with A SIP OF WATER: omeprazole (prilosec), tamsulosin (flomax), Lorazepam (ativan) if needed, tramadol (ultram) if needed, acetaminophen (tylenol) if needed, flonase if needed   STOP taking these medicines today: Aspirin, NSAIDS, Advil, ibuprofen, naproxen, BC's, Goody's, vitamins (magnesium, multivitamin, Vitamin B-12), herbal medications, and celecoxib (celebrex), voltaren gel (diclofenac sodium)   Do not wear jewelry.  Do not wear lotions, powders, or perfumes.  You may wear deodorant.  Do not shave 48 hours prior to surgery.  Men may shave face and neck.  Do not bring valuables to the hospital.  [facility] is not responsible for any belongings or valuables.  Contacts, dentures or bridgework may not be worn into surgery.  Leave your suitcase in the car.  After surgery it may be brought to your room.  For patients admitted to the hospital, discharge time will be determined by your treatment team.  Patients discharged the day of surgery will not be allowed to drive home.   Please read over the following fact sheets that you were given. Pain Booklet, Coughing and Deep Breathing and Surgical Site Infection Prevention

## 2015-08-27 MED ORDER — CEFAZOLIN SODIUM-DEXTROSE 2-4 GM/100ML-% IV SOLN
2.0000 g | INTRAVENOUS | Status: AC
  Administered 2015-08-28: 2 g via INTRAVENOUS
  Filled 2015-08-27: qty 100

## 2015-08-27 NOTE — Progress Notes (Signed)
Anesthesia Chart Review: Patient is a 82 year old male scheduled for L4-5 decompressive lumbar laminectomy on 08/28/15 by Dr. Sherwood Gambler.  History includes smoking, RLS, HTN, hypercholesterolemia, GERD, hiatal hernia, arthritis, dysrhythmia ("extra beats"), bilateral TKAs '05, toe amputation (right second toe) '10, tonsillectomy. He reported a history of shivers following anesthesia.   PCP is Dr. Lajean Manes. He was referred to cardiologist Dr. Ena Dawley in 04/2014 for evaluation of recurrent DOE and had a low risk stress test.    Meds include amlodipine, ASA 81mg  (on hold), benazepril, Celebrex, Zetia, Flonase, Ativan, Magnesium, Prilosec, Flomax, tramadol, zolpidem. Instructed to hold NSAIDS at PAT.  08/26/15 EKG: SR with marked sinus arrhythmia, occasional PACs and PVCs. LAD, inferior infarct (age undetermined). PAC/PVC is new, but otherwise I think his EKG is stable when compared to 05/01/14 tracing from [facility].  04/29/14 Nuclear stress test: Overall Impression: Low risk stress nuclear study With no areas of ischemia identified. Sensitivity of study reduced by arm attenuation, especially in the inferior/inferolateral region. LV Ejection Fraction: 50%. LV Wall Motion: NL LV Function; NL Wall Motion.  Preoperative labs noted.   If no acute changes then I would anticipate that he could proceed as planned.  [facility]/Anesthesiology Phone [phone] 08/27/2015 12:05 PM

## 2015-08-28 ENCOUNTER — Inpatient Hospital Stay (HOSPITAL_COMMUNITY): Payer: PPO | Admitting: Anesthesiology

## 2015-08-28 ENCOUNTER — Encounter (HOSPITAL_COMMUNITY)
Admission: RE | Disposition: A | Discharge status: Home / Self Care | Payer: Self-pay | Source: Ambulatory Visit | Attending: Neurosurgery

## 2015-08-28 ENCOUNTER — Inpatient Hospital Stay (HOSPITAL_COMMUNITY): Payer: PPO

## 2015-08-28 ENCOUNTER — Inpatient Hospital Stay (HOSPITAL_COMMUNITY): Payer: PPO | Admitting: Vascular Surgery

## 2015-08-28 ENCOUNTER — Encounter (HOSPITAL_COMMUNITY): Payer: Self-pay | Admitting: Surgery

## 2015-08-28 ENCOUNTER — Inpatient Hospital Stay (HOSPITAL_COMMUNITY)
Admission: RE | Admit: 2015-08-28 | Discharge: 2015-08-29 | DRG: 520 | Disposition: A | Discharge status: Home / Self Care | Payer: PPO | Source: Ambulatory Visit | Attending: Neurosurgery | Admitting: Neurosurgery

## 2015-08-28 DIAGNOSIS — M4806 Spinal stenosis, lumbar region: Secondary | ICD-10-CM | POA: Diagnosis not present

## 2015-08-28 DIAGNOSIS — Z7982 Long term (current) use of aspirin: Secondary | ICD-10-CM

## 2015-08-28 DIAGNOSIS — M47816 Spondylosis without myelopathy or radiculopathy, lumbar region: Secondary | ICD-10-CM | POA: Diagnosis not present

## 2015-08-28 DIAGNOSIS — E7800 Pure hypercholesterolemia, unspecified: Secondary | ICD-10-CM | POA: Diagnosis not present

## 2015-08-28 DIAGNOSIS — K449 Diaphragmatic hernia without obstruction or gangrene: Secondary | ICD-10-CM | POA: Diagnosis not present

## 2015-08-28 DIAGNOSIS — G2581 Restless legs syndrome: Secondary | ICD-10-CM | POA: Diagnosis not present

## 2015-08-28 DIAGNOSIS — Z96653 Presence of artificial knee joint, bilateral: Secondary | ICD-10-CM | POA: Diagnosis present

## 2015-08-28 DIAGNOSIS — I10 Essential (primary) hypertension: Secondary | ICD-10-CM | POA: Diagnosis present

## 2015-08-28 DIAGNOSIS — K219 Gastro-esophageal reflux disease without esophagitis: Secondary | ICD-10-CM | POA: Diagnosis present

## 2015-08-28 DIAGNOSIS — F17290 Nicotine dependence, other tobacco product, uncomplicated: Secondary | ICD-10-CM | POA: Diagnosis present

## 2015-08-28 DIAGNOSIS — Z79899 Other long term (current) drug therapy: Secondary | ICD-10-CM

## 2015-08-28 DIAGNOSIS — Z8249 Family history of ischemic heart disease and other diseases of the circulatory system: Secondary | ICD-10-CM | POA: Diagnosis not present

## 2015-08-28 DIAGNOSIS — M5126 Other intervertebral disc displacement, lumbar region: Secondary | ICD-10-CM | POA: Diagnosis not present

## 2015-08-28 DIAGNOSIS — M1990 Unspecified osteoarthritis, unspecified site: Secondary | ICD-10-CM | POA: Diagnosis not present

## 2015-08-28 DIAGNOSIS — Z888 Allergy status to other drugs, medicaments and biological substances status: Secondary | ICD-10-CM | POA: Diagnosis not present

## 2015-08-28 DIAGNOSIS — M48062 Spinal stenosis, lumbar region with neurogenic claudication: Secondary | ICD-10-CM | POA: Diagnosis present

## 2015-08-28 DIAGNOSIS — Z419 Encounter for procedure for purposes other than remedying health state, unspecified: Secondary | ICD-10-CM

## 2015-08-28 HISTORY — PX: LUMBAR LAMINECTOMY/DECOMPRESSION MICRODISCECTOMY: SHX5026

## 2015-08-28 SURGERY — LUMBAR LAMINECTOMY/DECOMPRESSION MICRODISCECTOMY 1 LEVEL
Anesthesia: General | Site: Spine Lumbar

## 2015-08-28 MED ORDER — PANTOPRAZOLE SODIUM 40 MG PO TBEC: 40.0000 mg | DELAYED_RELEASE_TABLET | Freq: Every day | ORAL | Status: DC

## 2015-08-28 MED ORDER — KCL IN DEXTROSE-NACL 20-5-0.45 MEQ/L-%-% IV SOLN: INTRAVENOUS | Status: DC

## 2015-08-28 MED ORDER — LIDOCAINE HCL (CARDIAC) 20 MG/ML IV SOLN
INTRAVENOUS | Status: DC | PRN
  Administered 2015-08-28: 40 mg via INTRAVENOUS

## 2015-08-28 MED ORDER — HYDROCODONE-ACETAMINOPHEN 5-325 MG PO TABS
1.0000 | ORAL_TABLET | ORAL | Status: DC | PRN
  Administered 2015-08-28 (×2): 2 via ORAL
  Filled 2015-08-28 (×3): qty 2

## 2015-08-28 MED ORDER — MORPHINE SULFATE (PF) 4 MG/ML IV SOLN: 4.0000 mg | INTRAVENOUS | Status: DC | PRN

## 2015-08-28 MED ORDER — BISACODYL 10 MG RE SUPP: 10.0000 mg | Freq: Every day | RECTAL | Status: DC | PRN

## 2015-08-28 MED ORDER — EPHEDRINE SULFATE-NACL 50-0.9 MG/10ML-% IV SOSY
PREFILLED_SYRINGE | INTRAVENOUS | Status: DC | PRN
  Administered 2015-08-28: 20 mg via INTRAVENOUS
  Administered 2015-08-28 (×2): 10 mg via INTRAVENOUS

## 2015-08-28 MED ORDER — ONDANSETRON HCL 4 MG/2ML IJ SOLN: 4.0000 mg | Freq: Four times a day (QID) | INTRAMUSCULAR | Status: DC | PRN

## 2015-08-28 MED ORDER — AMLODIPINE BESYLATE 5 MG PO TABS
5.0000 mg | ORAL_TABLET | Freq: Every evening | ORAL | Status: DC
  Administered 2015-08-28: 5 mg via ORAL
  Filled 2015-08-28: qty 1

## 2015-08-28 MED ORDER — PROPOFOL 10 MG/ML IV BOLUS
INTRAVENOUS | Status: DC | PRN
  Administered 2015-08-28: 140 mg via INTRAVENOUS

## 2015-08-28 MED ORDER — OXYCODONE HCL 5 MG/5ML PO SOLN: 5.0000 mg | Freq: Once | ORAL | Status: DC | PRN

## 2015-08-28 MED ORDER — LORAZEPAM 0.5 MG PO TABS: 0.5000 mg | ORAL_TABLET | Freq: Every day | ORAL | Status: DC | PRN

## 2015-08-28 MED ORDER — MIDAZOLAM HCL 5 MG/5ML IJ SOLN
INTRAMUSCULAR | Status: DC | PRN
  Administered 2015-08-28: 2 mg via INTRAVENOUS

## 2015-08-28 MED ORDER — LACTATED RINGERS IV SOLN
INTRAVENOUS | Status: DC | PRN
  Administered 2015-08-28 (×2): via INTRAVENOUS

## 2015-08-28 MED ORDER — FENTANYL CITRATE (PF) 100 MCG/2ML IJ SOLN
INTRAMUSCULAR | Status: AC
  Administered 2015-08-28: 50 ug via INTRAVENOUS
  Filled 2015-08-28: qty 2

## 2015-08-28 MED ORDER — MAGNESIUM HYDROXIDE 400 MG/5ML PO SUSP: 30.0000 mL | Freq: Every day | ORAL | Status: DC | PRN

## 2015-08-28 MED ORDER — OXYCODONE-ACETAMINOPHEN 5-325 MG PO TABS
1.0000 | ORAL_TABLET | ORAL | Status: DC | PRN
  Administered 2015-08-28 – 2015-08-29 (×2): 2 via ORAL
  Filled 2015-08-28: qty 2

## 2015-08-28 MED ORDER — ONDANSETRON HCL 4 MG/2ML IJ SOLN
INTRAMUSCULAR | Status: DC | PRN
  Administered 2015-08-28: 4 mg via INTRAVENOUS

## 2015-08-28 MED ORDER — ROCURONIUM BROMIDE 50 MG/5ML IV SOLN
INTRAVENOUS | Status: AC
  Filled 2015-08-28: qty 1

## 2015-08-28 MED ORDER — ONDANSETRON HCL 4 MG/2ML IJ SOLN
INTRAMUSCULAR | Status: AC
  Filled 2015-08-28: qty 2

## 2015-08-28 MED ORDER — KETOROLAC TROMETHAMINE 30 MG/ML IJ SOLN
15.0000 mg | Freq: Four times a day (QID) | INTRAMUSCULAR | Status: DC
  Administered 2015-08-28 – 2015-08-29 (×3): 15 mg via INTRAVENOUS
  Filled 2015-08-28 (×3): qty 1

## 2015-08-28 MED ORDER — ALUM & MAG HYDROXIDE-SIMETH 200-200-20 MG/5ML PO SUSP
30.0000 mL | Freq: Four times a day (QID) | ORAL | Status: DC | PRN
  Administered 2015-08-28: 30 mL via ORAL
  Filled 2015-08-28: qty 30

## 2015-08-28 MED ORDER — MENTHOL 3 MG MT LOZG
1.0000 | LOZENGE | OROMUCOSAL | Status: DC | PRN
Start: 2015-08-28 — End: 2015-08-29

## 2015-08-28 MED ORDER — PHENYLEPHRINE HCL 10 MG/ML IJ SOLN
10.0000 mg | INTRAVENOUS | Status: DC | PRN
  Administered 2015-08-28: 40 ug/min via INTRAVENOUS

## 2015-08-28 MED ORDER — HYDROXYZINE HCL 25 MG PO TABS: 50.0000 mg | ORAL_TABLET | ORAL | Status: DC | PRN

## 2015-08-28 MED ORDER — ONDANSETRON HCL 4 MG PO TABS: 4.0000 mg | ORAL_TABLET | Freq: Four times a day (QID) | ORAL | Status: DC | PRN

## 2015-08-28 MED ORDER — ACETAMINOPHEN 10 MG/ML IV SOLN
INTRAVENOUS | Status: DC | PRN
  Administered 2015-08-28: 1000 mg via INTRAVENOUS

## 2015-08-28 MED ORDER — SODIUM CHLORIDE 0.9% FLUSH: 3.0000 mL | INTRAVENOUS | Status: DC | PRN

## 2015-08-28 MED ORDER — ROCURONIUM BROMIDE 100 MG/10ML IV SOLN
INTRAVENOUS | Status: DC | PRN
  Administered 2015-08-28: 40 mg via INTRAVENOUS
  Administered 2015-08-28 (×3): 10 mg via INTRAVENOUS

## 2015-08-28 MED ORDER — BENAZEPRIL HCL 20 MG PO TABS
20.0000 mg | ORAL_TABLET | ORAL | Status: DC
  Administered 2015-08-29: 20 mg via ORAL
  Filled 2015-08-28: qty 1

## 2015-08-28 MED ORDER — BUPIVACAINE HCL (PF) 0.5 % IJ SOLN
INTRAMUSCULAR | Status: DC | PRN
  Administered 2015-08-28: 15 mL

## 2015-08-28 MED ORDER — FENTANYL CITRATE (PF) 100 MCG/2ML IJ SOLN
INTRAMUSCULAR | Status: DC | PRN
  Administered 2015-08-28: 100 ug via INTRAVENOUS
  Administered 2015-08-28: 50 ug via INTRAVENOUS

## 2015-08-28 MED ORDER — ACETAMINOPHEN 650 MG RE SUPP: 650.0000 mg | RECTAL | Status: DC | PRN

## 2015-08-28 MED ORDER — THROMBIN 5000 UNITS EX SOLR
CUTANEOUS | Status: DC | PRN
  Administered 2015-08-28 (×2): 5000 [IU] via TOPICAL

## 2015-08-28 MED ORDER — EZETIMIBE 10 MG PO TABS
10.0000 mg | ORAL_TABLET | Freq: Every evening | ORAL | Status: DC
  Administered 2015-08-28: 10 mg via ORAL
  Filled 2015-08-28: qty 1

## 2015-08-28 MED ORDER — FENTANYL CITRATE (PF) 250 MCG/5ML IJ SOLN
INTRAMUSCULAR | Status: AC
  Filled 2015-08-28: qty 5

## 2015-08-28 MED ORDER — SUCCINYLCHOLINE CHLORIDE 200 MG/10ML IV SOSY
PREFILLED_SYRINGE | INTRAVENOUS | Status: AC
  Filled 2015-08-28: qty 10

## 2015-08-28 MED ORDER — ONDANSETRON HCL 4 MG/2ML IJ SOLN: 4.0000 mg | Freq: Once | INTRAMUSCULAR | Status: DC | PRN

## 2015-08-28 MED ORDER — PROPOFOL 10 MG/ML IV BOLUS
INTRAVENOUS | Status: AC
  Filled 2015-08-28: qty 20

## 2015-08-28 MED ORDER — HEMOSTATIC AGENTS (NO CHARGE) OPTIME
TOPICAL | Status: DC | PRN
  Administered 2015-08-28: 1 via TOPICAL

## 2015-08-28 MED ORDER — SODIUM CHLORIDE 0.9% FLUSH
3.0000 mL | Freq: Two times a day (BID) | INTRAVENOUS | Status: DC
  Administered 2015-08-28 (×2): 3 mL via INTRAVENOUS

## 2015-08-28 MED ORDER — HYDROXYZINE HCL 50 MG/ML IM SOLN: 50.0000 mg | INTRAMUSCULAR | Status: DC | PRN

## 2015-08-28 MED ORDER — THROMBIN 5000 UNITS EX SOLR
CUTANEOUS | Status: DC | PRN
  Administered 2015-08-28: 5 mL via TOPICAL

## 2015-08-28 MED ORDER — ACETAMINOPHEN 10 MG/ML IV SOLN
INTRAVENOUS | Status: AC
  Filled 2015-08-28: qty 100

## 2015-08-28 MED ORDER — BACITRACIN 50000 UNITS IM SOLR
INTRAMUSCULAR | Status: DC | PRN
  Administered 2015-08-28: 500 mL

## 2015-08-28 MED ORDER — OXYCODONE HCL 5 MG PO TABS: 5.0000 mg | ORAL_TABLET | Freq: Once | ORAL | Status: DC | PRN

## 2015-08-28 MED ORDER — 0.9 % SODIUM CHLORIDE (POUR BTL) OPTIME
TOPICAL | Status: DC | PRN
  Administered 2015-08-28: 1000 mL

## 2015-08-28 MED ORDER — LIDOCAINE-EPINEPHRINE 1 %-1:100000 IJ SOLN
INTRAMUSCULAR | Status: DC | PRN
  Administered 2015-08-28: 15 mL

## 2015-08-28 MED ORDER — FENTANYL CITRATE (PF) 100 MCG/2ML IJ SOLN
25.0000 ug | INTRAMUSCULAR | Status: DC | PRN
  Administered 2015-08-28: 50 ug via INTRAVENOUS

## 2015-08-28 MED ORDER — MIDAZOLAM HCL 2 MG/2ML IJ SOLN
INTRAMUSCULAR | Status: AC
  Filled 2015-08-28: qty 2

## 2015-08-28 MED ORDER — KETOROLAC TROMETHAMINE 30 MG/ML IJ SOLN
15.0000 mg | Freq: Once | INTRAMUSCULAR | Status: AC
  Administered 2015-08-28: 15 mg via INTRAVENOUS

## 2015-08-28 MED ORDER — PHENOL 1.4 % MT LIQD: 1.0000 | OROMUCOSAL | Status: DC | PRN

## 2015-08-28 MED ORDER — ZOLPIDEM TARTRATE 5 MG PO TABS: 5.0000 mg | ORAL_TABLET | Freq: Every evening | ORAL | Status: DC | PRN

## 2015-08-28 MED ORDER — PHENYLEPHRINE 40 MCG/ML (10ML) SYRINGE FOR IV PUSH (FOR BLOOD PRESSURE SUPPORT)
PREFILLED_SYRINGE | INTRAVENOUS | Status: DC | PRN
  Administered 2015-08-28: 40 ug via INTRAVENOUS
  Administered 2015-08-28 (×2): 80 ug via INTRAVENOUS

## 2015-08-28 MED ORDER — ACETAMINOPHEN 325 MG PO TABS: 650.0000 mg | ORAL_TABLET | ORAL | Status: DC | PRN

## 2015-08-28 MED ORDER — SUGAMMADEX SODIUM 200 MG/2ML IV SOLN
INTRAVENOUS | Status: DC | PRN
  Administered 2015-08-28: 200 mg via INTRAVENOUS

## 2015-08-28 MED ORDER — CYCLOBENZAPRINE HCL 10 MG PO TABS
10.0000 mg | ORAL_TABLET | Freq: Three times a day (TID) | ORAL | Status: DC | PRN
  Administered 2015-08-28 – 2015-08-29 (×2): 10 mg via ORAL
  Filled 2015-08-28 (×2): qty 1

## 2015-08-28 MED ORDER — TAMSULOSIN HCL 0.4 MG PO CAPS: 0.4000 mg | ORAL_CAPSULE | Freq: Every day | ORAL | Status: DC

## 2015-08-28 MED ORDER — KETOROLAC TROMETHAMINE 15 MG/ML IJ SOLN
INTRAMUSCULAR | Status: AC
  Filled 2015-08-28: qty 1

## 2015-08-28 MED ORDER — EPHEDRINE 5 MG/ML INJ
INTRAVENOUS | Status: AC
  Filled 2015-08-28: qty 10

## 2015-08-28 SURGICAL SUPPLY — 68 items
ADH SKN CLS APL DERMABOND .7 (GAUZE/BANDAGES/DRESSINGS) ×1
ADH SKN CLS LQ APL DERMABOND (GAUZE/BANDAGES/DRESSINGS) ×1
APL SKNCLS STERI-STRIP NONHPOA (GAUZE/BANDAGES/DRESSINGS)
BAG DECANTER FOR FLEXI CONT (MISCELLANEOUS) ×2 IMPLANT
BENZOIN TINCTURE PRP APPL 2/3 (GAUZE/BANDAGES/DRESSINGS) IMPLANT
BLADE CLIPPER SURG (BLADE) ×1 IMPLANT
BLADE SURG 10 STRL SS (BLADE) ×1 IMPLANT
BRUSH SCRUB EZ PLAIN DRY (MISCELLANEOUS) ×2 IMPLANT
BUR ACORN 6.0 ACORN (BURR) IMPLANT
BUR ACRON 5.0MM COATED (BURR) ×1 IMPLANT
BUR MATCHSTICK NEURO 3.0 LAGG (BURR) ×2 IMPLANT
CANISTER SUCT 3000ML PPV (MISCELLANEOUS) ×2 IMPLANT
DERMABOND ADHESIVE PROPEN (GAUZE/BANDAGES/DRESSINGS) ×1
DERMABOND ADVANCED (GAUZE/BANDAGES/DRESSINGS) ×1
DERMABOND ADVANCED .7 DNX12 (GAUZE/BANDAGES/DRESSINGS) IMPLANT
DERMABOND ADVANCED .7 DNX6 (GAUZE/BANDAGES/DRESSINGS) IMPLANT
DRAPE LAPAROTOMY 100X72X124 (DRAPES) ×2 IMPLANT
DRAPE MICROSCOPE LEICA (MISCELLANEOUS) ×2 IMPLANT
DRAPE POUCH INSTRU U-SHP 10X18 (DRAPES) ×2 IMPLANT
DRSG EMULSION OIL 3X3 NADH (GAUZE/BANDAGES/DRESSINGS) IMPLANT
ELECT REM PT RETURN 9FT ADLT (ELECTROSURGICAL) ×2
ELECTRODE REM PT RTRN 9FT ADLT (ELECTROSURGICAL) ×1 IMPLANT
GAUZE SPONGE 4X4 12PLY STRL (GAUZE/BANDAGES/DRESSINGS) IMPLANT
GAUZE SPONGE 4X4 16PLY XRAY LF (GAUZE/BANDAGES/DRESSINGS) IMPLANT
GLOVE BIO SURGEON STRL SZ 6.5 (GLOVE) ×2 IMPLANT
GLOVE BIO SURGEON STRL SZ8 (GLOVE) ×1 IMPLANT
GLOVE BIOGEL PI IND STRL 6.5 (GLOVE) IMPLANT
GLOVE BIOGEL PI IND STRL 7.0 (GLOVE) IMPLANT
GLOVE BIOGEL PI IND STRL 8 (GLOVE) ×1 IMPLANT
GLOVE BIOGEL PI INDICATOR 6.5 (GLOVE) ×1
GLOVE BIOGEL PI INDICATOR 7.0 (GLOVE) ×1
GLOVE BIOGEL PI INDICATOR 8 (GLOVE) ×1
GLOVE ECLIPSE 7.5 STRL STRAW (GLOVE) ×2 IMPLANT
GLOVE EXAM NITRILE LRG STRL (GLOVE) IMPLANT
GLOVE EXAM NITRILE MD LF STRL (GLOVE) IMPLANT
GLOVE EXAM NITRILE XL STR (GLOVE) IMPLANT
GLOVE EXAM NITRILE XS STR PU (GLOVE) IMPLANT
GOWN STRL REUS W/ TWL LRG LVL3 (GOWN DISPOSABLE) ×1 IMPLANT
GOWN STRL REUS W/ TWL XL LVL3 (GOWN DISPOSABLE) IMPLANT
GOWN STRL REUS W/TWL 2XL LVL3 (GOWN DISPOSABLE) IMPLANT
GOWN STRL REUS W/TWL LRG LVL3 (GOWN DISPOSABLE) ×2
GOWN STRL REUS W/TWL XL LVL3 (GOWN DISPOSABLE) ×4
HEMOSTAT POWDER SURGIFOAM 1G (HEMOSTASIS) ×1 IMPLANT
KIT BASIN OR (CUSTOM PROCEDURE TRAY) ×2 IMPLANT
KIT ROOM TURNOVER OR (KITS) ×2 IMPLANT
NDL HYPO 18GX1.5 BLUNT FILL (NEEDLE) IMPLANT
NDL SPNL 18GX3.5 QUINCKE PK (NEEDLE) ×1 IMPLANT
NDL SPNL 22GX3.5 QUINCKE BK (NEEDLE) ×1 IMPLANT
NEEDLE HYPO 18GX1.5 BLUNT FILL (NEEDLE) IMPLANT
NEEDLE SPNL 18GX3.5 QUINCKE PK (NEEDLE) ×2 IMPLANT
NEEDLE SPNL 22GX3.5 QUINCKE BK (NEEDLE) ×2 IMPLANT
NS IRRIG 1000ML POUR BTL (IV SOLUTION) ×2 IMPLANT
PACK LAMINECTOMY NEURO (CUSTOM PROCEDURE TRAY) ×2 IMPLANT
PAD ARMBOARD 7.5X6 YLW CONV (MISCELLANEOUS) ×8 IMPLANT
PATTIES SURGICAL .5 X1 (DISPOSABLE) ×2 IMPLANT
RUBBERBAND STERILE (MISCELLANEOUS) ×4 IMPLANT
SPONGE LAP 4X18 X RAY DECT (DISPOSABLE) IMPLANT
SPONGE SURGIFOAM ABS GEL SZ50 (HEMOSTASIS) ×2 IMPLANT
STRIP CLOSURE SKIN 1/2X4 (GAUZE/BANDAGES/DRESSINGS) IMPLANT
SUT PROLENE 6 0 BV (SUTURE) IMPLANT
SUT VIC AB 1 CT1 18XBRD ANBCTR (SUTURE) ×1 IMPLANT
SUT VIC AB 1 CT1 8-18 (SUTURE) ×2
SUT VIC AB 2-0 CP2 18 (SUTURE) ×3 IMPLANT
SUT VIC AB 3-0 SH 8-18 (SUTURE) ×1 IMPLANT
SYR 5ML LL (SYRINGE) IMPLANT
TOWEL OR 17X24 6PK STRL BLUE (TOWEL DISPOSABLE) ×2 IMPLANT
TOWEL OR 17X26 10 PK STRL BLUE (TOWEL DISPOSABLE) ×2 IMPLANT
WATER STERILE IRR 1000ML POUR (IV SOLUTION) ×2 IMPLANT

## 2015-08-28 NOTE — Anesthesia Postprocedure Evaluation (Signed)
Anesthesia Post Note  Patient: Ruben Barrera  Procedure(s) Performed: Procedure(s) (LRB): Lumbar Four-Five decompressive lumbar laminectomy (N/A)  Patient location during evaluation: PACU Anesthesia Type: General Level of consciousness: awake, awake and alert and oriented Pain management: pain level controlled Vital Signs Assessment: post-procedure vital signs reviewed and stable Respiratory status: spontaneous breathing, nonlabored ventilation and respiratory function stable Cardiovascular status: blood pressure returned to baseline Anesthetic complications: no    Last Vitals:  Filed Vitals:   08/28/15 1226 08/28/15 1245  BP:  167/81  Pulse: 88 87  Temp: 36.1 C 36.1 C  Resp: 13 18    Last Pain:  Filed Vitals:   08/28/15 1248  PainSc: 3                  Mackenna Kamer COKER

## 2015-08-28 NOTE — Anesthesia Preprocedure Evaluation (Addendum)
Anesthesia Evaluation  Patient identified by MRN, date of birth, ID band Patient awake    History of Anesthesia Complications (+) history of anesthetic complications  Airway Mallampati: II  TM Distance: >3 FB Neck ROM: Full    Dental  (+) Teeth Intact, Dental Advisory Given   Pulmonary Current Smoker,    breath sounds clear to auscultation       Cardiovascular hypertension, Pt. on medications + DOE  + dysrhythmias  Rhythm:Regular Rate:Normal     Neuro/Psych    GI/Hepatic hiatal hernia, GERD  Controlled,  Endo/Other    Renal/GU      Musculoskeletal  (+) Arthritis , Osteoarthritis,    Abdominal   Peds  Hematology   Anesthesia Other Findings Hx shivering after anesthesia   Reproductive/Obstetrics                            Anesthesia Physical Anesthesia Plan  ASA: III  Anesthesia Plan: General   Post-op Pain Management:    Induction: Intravenous  Airway Management Planned: Oral ETT  Additional Equipment:   Intra-op Plan:   Post-operative Plan: Extubation in OR  Informed Consent: I have reviewed the patients History and Physical, chart, labs and discussed the procedure including the risks, benefits and alternatives for the proposed anesthesia with the patient or authorized representative who has indicated his/her understanding and acceptance.   Dental advisory given  Plan Discussed with: CRNA and Anesthesiologist  Anesthesia Plan Comments:         Anesthesia Quick Evaluation

## 2015-08-28 NOTE — H&P (Signed)
Subjective: Patient is a 82 y.o. right handed white male who is admitted for treatment of multifactorial lumbar stenosis.  Patient has extensive lumbar spondylosis and degenerative disease, with severe multifactorial lumbar stenosis at the L4-L5 level.  Symptomatically he's been having difficulties with low back pain, and overall left lower extremity pain, worse than right lower extremity pain. He is admitted now for a decompressive lumbar laminectomy.    Patient Active Problem List   Diagnosis Date Noted  . DOE (dyspnea on exertion) 05/01/2014  . Essential hypertension 05/01/2014  . Left shoulder pain 05/05/2011   Past Medical History  Diagnosis Date  . Lumbar spinal stenosis   . RLS (restless legs syndrome)     takes ativan as needed  . Hypertension   . Hypercholesteremia   . Complication of anesthesia     "had the shivers" after surgery  . Dysrhythmia     "extra heart beat"  . GERD (gastroesophageal reflux disease)   . History of hiatal hernia   . Arthritis     Past Surgical History  Procedure Laterality Date  . Replacement total knee bilateral Bilateral 11/05/2003  . Toe amputation Right 2010    second toe  . Tonsillectomy      Prescriptions prior to admission  Medication Sig Dispense Refill Last Dose  . acetaminophen (TYLENOL) 500 MG tablet Take 500 mg by mouth every 6 (six) hours as needed for mild pain.   08/28/2015 at 0600  . amLODipine (NORVASC) 10 MG tablet Take 5 mg by mouth every evening.    08/27/2015 at Unknown time  . aspirin 81 MG tablet Take 81 mg by mouth daily.   Past Week at Unknown time  . benazepril (LOTENSIN) 20 MG tablet Take 20 mg by mouth every morning.    08/28/2015 at 0600  . celecoxib (CELEBREX) 200 MG capsule Take 200 mg by mouth 2 (two) times daily.   Past Month at Unknown time  . Cyanocobalamin (VITAMIN B-12 PO) Take 1 tablet by mouth daily.   Past Week at Unknown time  . diclofenac sodium (VOLTAREN) 1 % GEL Apply 1 application topically 2 (two) times  daily as needed. General pain   Past Week at Unknown time  . ezetimibe (ZETIA) 10 MG tablet Take 10 mg by mouth every evening.    08/27/2015 at Unknown time  . fluticasone (FLONASE) 50 MCG/ACT nasal spray Place 1 spray into both nostrils daily as needed for allergies.    08/28/2015 at 0600  . LORazepam (ATIVAN) 1 MG tablet Take 0.5 mg by mouth daily as needed for anxiety.   08/27/2015 at Unknown time  . MAGNESIUM PO Take 1 tablet by mouth daily.   Past Month at Unknown time  . MULTIPLE VITAMIN PO Take 1 tablet by mouth daily.    Past Week at Unknown time  . omeprazole (PRILOSEC) 20 MG capsule Take 20 mg by mouth daily.   08/28/2015 at 0600  . tamsulosin (FLOMAX) 0.4 MG CAPS capsule Take 0.4 mg by mouth daily.    08/28/2015 at 0600  . traMADol (ULTRAM) 50 MG tablet Take 100 mg by mouth every 4 (four) hours as needed for moderate pain.    08/28/2015 at 0600  . zolpidem (AMBIEN) 10 MG tablet Take 5 mg by mouth at bedtime as needed for sleep.   08/27/2015 at Unknown time   Allergies  Allergen Reactions  . Other Other (See Comments)    Reaction to anesthesia per patient  . Simvastatin Other (See Comments)  Leg pains     Social History  Substance Use Topics  . Smoking status: Current Every Day Smoker    Types: Cigars  . Smokeless tobacco: Never Used     Comment: quit 1972  . Alcohol Use: 0.0 oz/week    0 Standard drinks or equivalent per week     Comment: occasionally beer or scotch    Family History  Problem Relation Age of Onset  . Stroke Mother   . Hypertension      family history     Review of Systems A comprehensive review of systems was negative.  Objective: Vital signs in last 24 hours: Temp:  [98.3 F (36.8 C)] 98.3 F (36.8 C) (06/07 0712) Pulse Rate:  [90] 90 (06/07 0712) Resp:  [16] 16 (06/07 0712) BP: (151)/(90) 151/90 mmHg (06/07 0712) SpO2:  [93 %] 93 % (06/07 0712)  EXAM: Patient well-developed well-nourished white male in no acute distress. Lungs are clear to  auscultation , the patient has symmetrical respiratory excursion. Heart has a regular rate and rhythm normal S1 and S2 no murmur.   Abdomen is soft nontender nondistended bowel sounds are present. Extremity examination shows no clubbing cyanosis or edema. Motor examination shows 5 over 5 strength in the lower extremities including the iliopsoas quadriceps dorsiflexor extensor hallicus  longus and plantar flexor bilaterally. Sensation is intact to pinprick in the distal lower extremities. Reflexes are symmetrical bilaterally. No pathologic reflexes are present. Patient has a normal gait and stance.   Data Review:CBC    Component Value Date/Time   WBC 6.4 08/26/2015 1524   RBC 4.88 08/26/2015 1524   HGB 15.1 08/26/2015 1524   HCT 43.9 08/26/2015 1524   PLT 242 08/26/2015 1524   MCV 90.0 08/26/2015 1524   MCH 30.9 08/26/2015 1524   MCHC 34.4 08/26/2015 1524   RDW 13.0 08/26/2015 1524                          BMET    Component Value Date/Time   NA 137 08/26/2015 1524   K 4.3 08/26/2015 1524   CL 104 08/26/2015 1524   CO2 25 08/26/2015 1524   GLUCOSE 118* 08/26/2015 1524   BUN 24* 08/26/2015 1524   CREATININE 1.12 08/26/2015 1524   CALCIUM 9.2 08/26/2015 1524   GFRNONAA >60 08/26/2015 1524   GFRAA >60 08/26/2015 1524     Assessment/Plan: Patient with multifactorial lumbar stenosis was admitted for decompressive lumbar laminectomy.  I've discussed with the patient the nature of his condition, the nature the surgical procedure, the typical length of surgery, hospital stay, and overall recuperation. We discussed limitations postoperatively. I discussed risks of surgery including risks of infection, bleeding, possibly need for transfusion, the risk of nerve root dysfunction with pain, weakness, numbness, or paresthesias, or risk of dural tear and CSF leakage and possible need for further surgery, and the risk of anesthetic complications including myocardial infarction, stroke, pneumonia,  and death. Understanding all this the patient does wish to proceed with surgery and is admitted for such.    Hosie Spangle, MD 08/28/2015 8:29 AM

## 2015-08-28 NOTE — Progress Notes (Signed)
Filed Vitals:   08/28/15 1200 08/28/15 1215 08/28/15 1226 08/28/15 1245  BP:  139/73  167/81  Pulse: 84 89 88 87  Temp:   97 F (36.1 C) 97 F (36.1 C)  TempSrc:      Resp: 12 16 13 18   SpO2: 99% 97% 99% 100%    CBC  Recent Labs  08/26/15 1524  WBC 6.4  HGB 15.1  HCT 43.9  PLT 242   BMET  Recent Labs  08/26/15 1524  NA 137  K 4.3  CL 104  CO2 25  GLUCOSE 118*  BUN 24*  CREATININE 1.12  CALCIUM 9.2    Patient resting in bed. Has been up and ambulating the halls twice already. Has voided twice. Wound clean and dry. Has been making excellent progress following surgery. Encouraged to continue to ambulate.  Plan: Continue to progress through postoperative recovery.  Hosie Spangle, MD 08/28/2015, 5:41 PM

## 2015-08-28 NOTE — Anesthesia Procedure Notes (Signed)
Procedure Name: Intubation Date/Time: 08/28/2015 8:51 AM Performed by: Carney Living Pre-anesthesia Checklist: Patient identified, Emergency Drugs available, Suction available, Patient being monitored and Timeout performed Patient Re-evaluated:Patient Re-evaluated prior to inductionOxygen Delivery Method: Circle system utilized Preoxygenation: Pre-oxygenation with 100% oxygen Intubation Type: IV induction Ventilation: Mask ventilation without difficulty and Oral airway inserted - appropriate to patient size Laryngoscope Size: Mac and 4 Grade View: Grade II Tube type: Oral Tube size: 7.5 mm Number of attempts: 1 Airway Equipment and Method: Stylet Placement Confirmation: ETT inserted through vocal cords under direct vision,  positive ETCO2 and breath sounds checked- equal and bilateral Secured at: 23 cm Tube secured with: Tape Dental Injury: Teeth and Oropharynx as per pre-operative assessment

## 2015-08-28 NOTE — Transfer of Care (Signed)
Immediate Anesthesia Transfer of Care Note  Patient: Ruben Barrera  Procedure(s) Performed: Procedure(s): Lumbar Four-Five decompressive lumbar laminectomy (N/A)  Patient Location: PACU  Anesthesia Type:General  Level of Consciousness: awake, alert , oriented and patient cooperative  Airway & Oxygen Therapy: Patient Spontanous Breathing and Patient connected to nasal cannula oxygen  Post-op Assessment: Report given to RN, Post -op Vital signs reviewed and stable and Patient moving all extremities X 4  Post vital signs: Reviewed and stable  Last Vitals:  Filed Vitals:   08/28/15 1145 08/28/15 1156  BP:  135/80  Pulse: 88 89  Temp:    Resp: 11 13    Last Pain:  Filed Vitals:   08/28/15 1158  PainSc: 7       Patients Stated Pain Goal: 5 ([id number])  Complications: No apparent anesthesia complications

## 2015-08-28 NOTE — Op Note (Signed)
**Note Ruben-Identified via Obfuscation** 08/28/2015  11:12 AM  PATIENT:  Ruben Barrera  82 y.o. male  PRE-OPERATIVE DIAGNOSIS:  Lumbar stenosis with neurogenic claudication, left L4-5 HNP, lumbar spondylosis, lumbar degenerative disc disease  POST-OPERATIVE DIAGNOSIS:  Lumbar stenosis with neurogenic claudication, left L4-5 HNP, lumbar spondylosis, lumbar degenerative disc disease  PROCEDURE:  Procedure(s):  L4 and L5 decompressive lumbar laminectomy with microdissection, microsurgical technique, and the operating microscope; left L4-5 microdiscectomy  SURGEON:  Surgeon(s): Jovita Gamma, MD Eustace Moore, MD  ASSISTANTS: Sherley Bounds, M.D.  ANESTHESIA:   general  EBL:  Total I/O In: 1000 [I.V.:1000] Out: -   BLOOD ADMINISTERED:none  COUNT: Correct per nursing staff  DICTATION: Patient was brought to the operating room placed under general endotracheal anesthesia. Patient was turned to a prone position the lumbar region was prepped with Betadine soap and solution and draped in a sterile fashion. The midline was infiltrated with local anesthetic with epinephrine. A localizing x-ray was taken and then a midline incision was made carried down thru the subcutaneous tissue, bipolar cautery and electrocautery were used to maintain hemostasis. Dissection was carried down to the lumbar fascia which was incised bilaterally and the paraspinal muscles were dissected from the spinous process and lamina in a subperiosteal fashion. Another localizing x-ray was taken and the L4 and L5 levels were identified. Laminectomy was begun with double-action rongeurs, a high-speed drill, and Kerrison punches. The thickened ligamentum flavum was carefully removed. Dissection was carried laterally to decompress the lateral stenosis taking care to leave the facet complexes intact. Once the laminectomy and medial facetectomy was completed, the operating microscope was draped and brought in the field provided additional magnification, illumination, and  visualization, and the remainder the decompression was completed. The ventral epidural space on the left side was carefully explored. We verified the thecal sac and exiting left L5 nerve root. The degenerated bulging L4-5 annulus was identified, but rostral to that in the left lateral epidural space, a free fragment disc herniation was found. There was moderate epidural scarring around it, that was carefully dissected and freed up. The disc fragment was adherent to the ventral aspect of the thecal sac. This was freed up as well. And the free fragment disc herniation was removed, further decompressing the thecal sac and exiting left L5 nerve root. Once the decompression was completed hemostasis was established with the use of bipolar cautery, Gelfoam with thrombin, and Surgifoam. Paraspinal muscles, deep fascia, and Scarpa's fascia were closed in separate layers with interrupted 1 undyed Vicryl sutures. The subcutaneous and subcuticular were closed with interrupted inverted 2-0 and 3-0 undyed Vicryl sutures. Skin edges were approximated with Dermabond.  Our surgery the patient was turned back to supine position, to be reversed from the anesthetic, extubated, and transferred to the recovery room for further care.   PLAN OF CARE: Admit for overnight observation  PATIENT DISPOSITION:  PACU - hemodynamically stable.   Delay start of Pharmacological VTE agent (>24hrs) due to surgical blood loss or risk of bleeding:  yes

## 2015-08-29 ENCOUNTER — Encounter (HOSPITAL_COMMUNITY): Payer: Self-pay | Admitting: Neurosurgery

## 2015-08-29 MED ORDER — HYDROCODONE-ACETAMINOPHEN 5-325 MG PO TABS: 1.0000 | ORAL_TABLET | ORAL | Status: DC | PRN

## 2015-08-29 NOTE — Progress Notes (Signed)
Patient alert and oriented, mae's well, voiding adequate amount of urine, swallowing without difficulty, no c/o pain. Patient discharged home with family. Script and discharged instructions given to patient. Patient and family stated understanding of d/c instructions given and has an appointment with MD. 

## 2015-08-29 NOTE — Discharge Instructions (Signed)

## 2015-08-29 NOTE — Discharge Summary (Signed)
Physician Discharge Summary  Patient ID: Ruben Barrera MRN: [id number] DOB/AGE: 80-28-1934 80 y.o.  Admit date: 08/28/2015 Discharge date: 08/29/2015  Admission Diagnoses:  Lumbar stenosis with neurogenic claudication, left L4-5 HNP, lumbar spondylosis, lumbar degenerative disc disease  Discharge Diagnoses:  Lumbar stenosis with neurogenic claudication, left L4-5 HNP, lumbar spondylosis, lumbar degenerative disc disease Active Problems:   Lumbar stenosis with neurogenic claudication   Discharged Condition: good  Hospital Course: Patient was admitted, underwent a L4 and L5 decompressive lumbar laminectomy and a left L4-5 microdiscectomy. Postoperatively he has done very well, with excellent relief of his neurogenic claudication. His incision is healing nicely. There is no erythema, swelling, ecchymosis, or drainage. He is asking to be discharged to home. He and his wife have been given instructions regarding wound care and activities following discharge. He is scheduled to follow-up with me in the office in 3 weeks.  Discharge Exam: Blood pressure 141/71, pulse 87, temperature 97.8 F (36.6 C), temperature source Oral, resp. rate 18, SpO2 97 %.  Disposition:  Home     Medication List    TAKE these medications        acetaminophen 500 MG tablet  Commonly known as:  TYLENOL  Take 500 mg by mouth every 6 (six) hours as needed for mild pain.     amLODipine 10 MG tablet  Commonly known as:  NORVASC  Take 5 mg by mouth every evening.     aspirin 81 MG tablet  Take 81 mg by mouth daily.     benazepril 20 MG tablet  Commonly known as:  LOTENSIN  Take 20 mg by mouth every morning.     celecoxib 200 MG capsule  Commonly known as:  CELEBREX  Take 200 mg by mouth 2 (two) times daily.     diclofenac sodium 1 % Gel  Commonly known as:  VOLTAREN  Apply 1 application topically 2 (two) times daily as needed. General pain     ezetimibe 10 MG tablet  Commonly known as:  ZETIA  Take 10  mg by mouth every evening.     fluticasone 50 MCG/ACT nasal spray  Commonly known as:  FLONASE  Place 1 spray into both nostrils daily as needed for allergies.     HYDROcodone-acetaminophen 5-325 MG tablet  Commonly known as:  NORCO/VICODIN  Take 1-2 tablets by mouth every 4 (four) hours as needed (mild pain).     LORazepam 1 MG tablet  Commonly known as:  ATIVAN  Take 0.5 mg by mouth daily as needed for anxiety.     MAGNESIUM PO  Take 1 tablet by mouth daily.     MULTIPLE VITAMIN PO  Take 1 tablet by mouth daily.     omeprazole 20 MG capsule  Commonly known as:  PRILOSEC  Take 20 mg by mouth daily.     tamsulosin 0.4 MG Caps capsule  Commonly known as:  FLOMAX  Take 0.4 mg by mouth daily.     traMADol 50 MG tablet  Commonly known as:  ULTRAM  Take 100 mg by mouth every 4 (four) hours as needed for moderate pain.     VITAMIN B-12 PO  Take 1 tablet by mouth daily.     zolpidem 10 MG tablet  Commonly known as:  AMBIEN  Take 5 mg by mouth at bedtime as needed for sleep.         SignedHosie Spangle 08/29/2015, 7:48 AM

## 2015-09-25 DIAGNOSIS — M19011 Primary osteoarthritis, right shoulder: Secondary | ICD-10-CM | POA: Diagnosis not present

## 2015-09-25 DIAGNOSIS — M25519 Pain in unspecified shoulder: Secondary | ICD-10-CM | POA: Diagnosis not present

## 2015-10-23 DIAGNOSIS — I10 Essential (primary) hypertension: Secondary | ICD-10-CM | POA: Diagnosis not present

## 2015-10-23 DIAGNOSIS — M25511 Pain in right shoulder: Secondary | ICD-10-CM | POA: Diagnosis not present

## 2015-10-23 DIAGNOSIS — G8929 Other chronic pain: Secondary | ICD-10-CM | POA: Diagnosis not present

## 2015-10-23 DIAGNOSIS — M79605 Pain in left leg: Secondary | ICD-10-CM | POA: Diagnosis not present

## 2015-10-30 ENCOUNTER — Other Ambulatory Visit: Payer: Self-pay | Admitting: Neurosurgery

## 2015-10-30 DIAGNOSIS — M4316 Spondylolisthesis, lumbar region: Secondary | ICD-10-CM

## 2015-11-05 ENCOUNTER — Ambulatory Visit
Admission: RE | Admit: 2015-11-05 | Discharge: 2015-11-05 | Disposition: A | Discharge status: Home / Self Care | Payer: PPO | Source: Ambulatory Visit | Attending: Neurosurgery | Admitting: Neurosurgery

## 2015-11-05 DIAGNOSIS — M4316 Spondylolisthesis, lumbar region: Secondary | ICD-10-CM

## 2015-11-05 DIAGNOSIS — M5126 Other intervertebral disc displacement, lumbar region: Secondary | ICD-10-CM | POA: Diagnosis not present

## 2015-11-05 MED ORDER — GADOBENATE DIMEGLUMINE 529 MG/ML IV SOLN
16.0000 mL | Freq: Once | INTRAVENOUS | Status: AC | PRN
  Administered 2015-11-05: 16 mL via INTRAVENOUS

## 2015-11-07 ENCOUNTER — Other Ambulatory Visit: Payer: PPO

## 2015-11-07 DIAGNOSIS — M5116 Intervertebral disc disorders with radiculopathy, lumbar region: Secondary | ICD-10-CM | POA: Diagnosis not present

## 2015-11-07 DIAGNOSIS — M4806 Spinal stenosis, lumbar region: Secondary | ICD-10-CM | POA: Diagnosis not present

## 2015-11-07 DIAGNOSIS — M4726 Other spondylosis with radiculopathy, lumbar region: Secondary | ICD-10-CM | POA: Diagnosis not present

## 2015-11-22 DIAGNOSIS — Z96652 Presence of left artificial knee joint: Secondary | ICD-10-CM | POA: Diagnosis not present

## 2015-11-22 DIAGNOSIS — Z96651 Presence of right artificial knee joint: Secondary | ICD-10-CM | POA: Diagnosis not present

## 2015-11-22 DIAGNOSIS — Z471 Aftercare following joint replacement surgery: Secondary | ICD-10-CM | POA: Diagnosis not present

## 2015-11-22 DIAGNOSIS — Z96653 Presence of artificial knee joint, bilateral: Secondary | ICD-10-CM | POA: Diagnosis not present

## 2015-11-28 ENCOUNTER — Other Ambulatory Visit (HOSPITAL_COMMUNITY): Payer: Self-pay | Admitting: Orthopedic Surgery

## 2015-11-28 DIAGNOSIS — Z471 Aftercare following joint replacement surgery: Secondary | ICD-10-CM

## 2015-11-28 DIAGNOSIS — Z96653 Presence of artificial knee joint, bilateral: Principal | ICD-10-CM

## 2015-12-04 DIAGNOSIS — M4726 Other spondylosis with radiculopathy, lumbar region: Secondary | ICD-10-CM | POA: Diagnosis not present

## 2015-12-04 DIAGNOSIS — M5136 Other intervertebral disc degeneration, lumbar region: Secondary | ICD-10-CM | POA: Diagnosis not present

## 2015-12-04 DIAGNOSIS — M5416 Radiculopathy, lumbar region: Secondary | ICD-10-CM | POA: Diagnosis not present

## 2015-12-04 DIAGNOSIS — Z9889 Other specified postprocedural states: Secondary | ICD-10-CM | POA: Diagnosis not present

## 2015-12-04 DIAGNOSIS — M5126 Other intervertebral disc displacement, lumbar region: Secondary | ICD-10-CM | POA: Diagnosis not present

## 2015-12-09 ENCOUNTER — Other Ambulatory Visit: Payer: Self-pay | Admitting: Neurosurgery

## 2015-12-10 DIAGNOSIS — Z23 Encounter for immunization: Secondary | ICD-10-CM | POA: Diagnosis not present

## 2015-12-10 DIAGNOSIS — I10 Essential (primary) hypertension: Secondary | ICD-10-CM | POA: Diagnosis not present

## 2015-12-10 DIAGNOSIS — M5430 Sciatica, unspecified side: Secondary | ICD-10-CM | POA: Diagnosis not present

## 2015-12-12 ENCOUNTER — Encounter (HOSPITAL_COMMUNITY): Payer: PPO

## 2015-12-27 DIAGNOSIS — R350 Frequency of micturition: Secondary | ICD-10-CM | POA: Diagnosis not present

## 2015-12-27 DIAGNOSIS — R51 Headache: Secondary | ICD-10-CM | POA: Diagnosis not present

## 2015-12-27 DIAGNOSIS — R6883 Chills (without fever): Secondary | ICD-10-CM | POA: Diagnosis not present

## 2015-12-30 DIAGNOSIS — I10 Essential (primary) hypertension: Secondary | ICD-10-CM | POA: Diagnosis not present

## 2015-12-30 DIAGNOSIS — F321 Major depressive disorder, single episode, moderate: Secondary | ICD-10-CM | POA: Diagnosis not present

## 2015-12-30 DIAGNOSIS — M5430 Sciatica, unspecified side: Secondary | ICD-10-CM | POA: Diagnosis not present

## 2016-01-06 ENCOUNTER — Other Ambulatory Visit (HOSPITAL_COMMUNITY): Payer: Self-pay | Admitting: *Deleted

## 2016-01-06 ENCOUNTER — Encounter (HOSPITAL_COMMUNITY): Payer: Self-pay

## 2016-01-06 ENCOUNTER — Encounter (HOSPITAL_COMMUNITY)
Admission: RE | Admit: 2016-01-06 | Discharge: 2016-01-06 | Disposition: A | Discharge status: Home / Self Care | Payer: PPO | Source: Ambulatory Visit | Attending: Neurosurgery | Admitting: Neurosurgery

## 2016-01-06 DIAGNOSIS — Z01818 Encounter for other preprocedural examination: Secondary | ICD-10-CM | POA: Insufficient documentation

## 2016-01-06 DIAGNOSIS — R197 Diarrhea, unspecified: Secondary | ICD-10-CM | POA: Diagnosis not present

## 2016-01-06 HISTORY — DX: Major depressive disorder, single episode, unspecified: F32.9

## 2016-01-06 HISTORY — DX: Depression, unspecified: F32.A

## 2016-01-06 LAB — BASIC METABOLIC PANEL
Anion gap: 7 (ref 5–15)
BUN: 23 mg/dL — ABNORMAL HIGH (ref 6–20)
CO2: 27 mmol/L (ref 22–32)
Calcium: 9.1 mg/dL (ref 8.9–10.3)
Chloride: 103 mmol/L (ref 101–111)
Creatinine, Ser: 0.92 mg/dL (ref 0.61–1.24)
GFR calc Af Amer: >60 mL/min (ref >60)
GFR calc non Af Amer: >60 mL/min (ref >60)
Glucose, Bld: 85 mg/dL (ref 65–99)
Potassium: 4.5 mmol/L (ref 3.5–5.1)
Sodium: 137 mmol/L (ref 135–145)

## 2016-01-06 LAB — CBC
HCT: 43.4 % (ref 39.0–52.0)
Hemoglobin: 14.7 g/dL (ref 13.0–17.0)
MCH: 30.9 pg (ref 26.0–34.0)
MCHC: 33.9 g/dL (ref 30.0–36.0)
MCV: 91.4 fL (ref 78.0–100.0)
Platelets: 302 10*3/uL (ref 150–400)
RBC: 4.75 MIL/uL (ref 4.22–5.81)
RDW: 13.2 % (ref 11.5–15.5)
WBC: 6.7 10*3/uL (ref 4.0–10.5)

## 2016-01-06 LAB — SURGICAL PCR SCREEN
MRSA, PCR: NEGATIVE
Staphylococcus aureus: NEGATIVE

## 2016-01-06 MED ORDER — CHLORHEXIDINE GLUCONATE CLOTH 2 % EX PADS: 6.0000 | MEDICATED_PAD | Freq: Once | CUTANEOUS | Status: DC

## 2016-01-06 NOTE — Pre-Procedure Instructions (Signed)
    NICKOLAUS LITWAK  01/06/2016      [address], [place] [address] Phone: [phone] Fax: [phone]    Your procedure is scheduled on 01-13-2016  Monday .  Report to [facility] Admitting at 5:30 AM    Call this number if you have problems the morning of surgery:  [phone]   Remember:  Do not eat food or drink liquids after midnight.   Take these medicines the morning of surgery with A SIP OF WATER  Amlodipine(Norvasc),Gabapentin(Neurontin),pain medication if needed,Lorazepam(Ativan),omeprazole(Prilosec),Tamsulosin(Flomax),Tramadol if needed            STOP ASPIRIN,ANTIINFLAMATORIES (IBUPROFEN,ALEVE,MOTRIN,ADVIL,GOODY'S POWDERS),HERBAL SUPPLEMENTS,FISH OIL,AND VITAMINS 5-7 DAYS PRIOR TO SURGERY    Do not wear jewelry.  Do not wear lotions, powders, or perfumes, or deoderant.  Do not shave 48 hours prior to surgery.  Men may shave face and neck.   Do not bring valuables to the hospital.  [facility] is not responsible for any belongings or valuables.  Contacts, dentures or bridgework may not be worn into surgery.  Leave your suitcase in the car.  After surgery it may be brought to your room.  For patients admitted to the hospital, discharge time will be determined by your treatment team.  Patients discharged the day of surgery will not be allowed to drive home.    Special instructions:  See attached Sheet for instructions on CHG showers  Please read over the following fact sheets that you were given. MRSA Information

## 2016-01-08 DIAGNOSIS — F321 Major depressive disorder, single episode, moderate: Secondary | ICD-10-CM | POA: Diagnosis not present

## 2016-01-08 DIAGNOSIS — I10 Essential (primary) hypertension: Secondary | ICD-10-CM | POA: Diagnosis not present

## 2016-01-12 NOTE — Anesthesia Preprocedure Evaluation (Addendum)
Anesthesia Evaluation  Patient identified by MRN, date of birth, ID band Patient awake    Reviewed: Allergy & Precautions, NPO status , Patient's Chart, lab work & pertinent test results  History of Anesthesia Complications Negative for: history of anesthetic complications  Airway Mallampati: II  TM Distance: >3 FB Neck ROM: Full    Dental  (+) Dental Advisory Given, Implants, Caps   Pulmonary Current Smoker,    breath sounds clear to auscultation       Cardiovascular hypertension, Pt. on medications (-) angina Rhythm:Regular Rate:Normal  '16 Stress test: normal perfusion   Neuro/Psych Depression Chronic back pain: narcotics    GI/Hepatic Neg liver ROS, hiatal hernia, GERD  Medicated and Controlled,  Endo/Other  negative endocrine ROS  Renal/GU negative Renal ROS     Musculoskeletal  (+) Arthritis , Osteoarthritis,    Abdominal   Peds  Hematology negative hematology ROS (+)   Anesthesia Other Findings   Reproductive/Obstetrics                            Anesthesia Physical Anesthesia Plan  ASA: III  Anesthesia Plan: General   Post-op Pain Management:    Induction: Intravenous  Airway Management Planned: Oral ETT  Additional Equipment:   Intra-op Plan:   Post-operative Plan: Extubation in OR  Informed Consent: I have reviewed the patients History and Physical, chart, labs and discussed the procedure including the risks, benefits and alternatives for the proposed anesthesia with the patient or authorized representative who has indicated his/her understanding and acceptance.   Dental advisory given  Plan Discussed with: CRNA and Surgeon  Anesthesia Plan Comments: (Plan routine monitors, GETA Pt concerned about post op shivering, discussed periop warming with pt and wife)        Anesthesia Quick Evaluation

## 2016-01-13 ENCOUNTER — Inpatient Hospital Stay (HOSPITAL_COMMUNITY): Payer: PPO | Admitting: Anesthesiology

## 2016-01-13 ENCOUNTER — Inpatient Hospital Stay (HOSPITAL_COMMUNITY): Payer: PPO

## 2016-01-13 ENCOUNTER — Observation Stay (HOSPITAL_COMMUNITY)
Admission: RE | Admit: 2016-01-13 | Discharge: 2016-01-14 | Disposition: A | Discharge status: Home / Self Care | Payer: PPO | Source: Ambulatory Visit | Attending: Neurosurgery | Admitting: Neurosurgery

## 2016-01-13 ENCOUNTER — Encounter (HOSPITAL_COMMUNITY)
Admission: RE | Disposition: A | Discharge status: Home / Self Care | Payer: Self-pay | Source: Ambulatory Visit | Attending: Neurosurgery

## 2016-01-13 ENCOUNTER — Encounter (HOSPITAL_COMMUNITY): Payer: Self-pay | Admitting: *Deleted

## 2016-01-13 DIAGNOSIS — M4316 Spondylolisthesis, lumbar region: Secondary | ICD-10-CM | POA: Insufficient documentation

## 2016-01-13 DIAGNOSIS — M4726 Other spondylosis with radiculopathy, lumbar region: Secondary | ICD-10-CM | POA: Insufficient documentation

## 2016-01-13 DIAGNOSIS — Z7982 Long term (current) use of aspirin: Secondary | ICD-10-CM | POA: Diagnosis not present

## 2016-01-13 DIAGNOSIS — K449 Diaphragmatic hernia without obstruction or gangrene: Secondary | ICD-10-CM | POA: Insufficient documentation

## 2016-01-13 DIAGNOSIS — M5126 Other intervertebral disc displacement, lumbar region: Secondary | ICD-10-CM | POA: Diagnosis present

## 2016-01-13 DIAGNOSIS — Z419 Encounter for procedure for purposes other than remedying health state, unspecified: Secondary | ICD-10-CM

## 2016-01-13 DIAGNOSIS — Z888 Allergy status to other drugs, medicaments and biological substances status: Secondary | ICD-10-CM | POA: Insufficient documentation

## 2016-01-13 DIAGNOSIS — M549 Dorsalgia, unspecified: Secondary | ICD-10-CM | POA: Insufficient documentation

## 2016-01-13 DIAGNOSIS — M4326 Fusion of spine, lumbar region: Secondary | ICD-10-CM | POA: Diagnosis not present

## 2016-01-13 DIAGNOSIS — F17290 Nicotine dependence, other tobacco product, uncomplicated: Secondary | ICD-10-CM | POA: Diagnosis not present

## 2016-01-13 DIAGNOSIS — G8929 Other chronic pain: Secondary | ICD-10-CM | POA: Insufficient documentation

## 2016-01-13 DIAGNOSIS — F329 Major depressive disorder, single episode, unspecified: Secondary | ICD-10-CM | POA: Diagnosis not present

## 2016-01-13 DIAGNOSIS — M48061 Spinal stenosis, lumbar region without neurogenic claudication: Secondary | ICD-10-CM | POA: Diagnosis not present

## 2016-01-13 DIAGNOSIS — Z96653 Presence of artificial knee joint, bilateral: Secondary | ICD-10-CM | POA: Insufficient documentation

## 2016-01-13 DIAGNOSIS — K219 Gastro-esophageal reflux disease without esophagitis: Secondary | ICD-10-CM | POA: Diagnosis not present

## 2016-01-13 DIAGNOSIS — E7800 Pure hypercholesterolemia, unspecified: Secondary | ICD-10-CM | POA: Diagnosis not present

## 2016-01-13 DIAGNOSIS — G2581 Restless legs syndrome: Secondary | ICD-10-CM | POA: Diagnosis not present

## 2016-01-13 DIAGNOSIS — M1990 Unspecified osteoarthritis, unspecified site: Secondary | ICD-10-CM | POA: Diagnosis not present

## 2016-01-13 DIAGNOSIS — I10 Essential (primary) hypertension: Secondary | ICD-10-CM | POA: Insufficient documentation

## 2016-01-13 DIAGNOSIS — M5116 Intervertebral disc disorders with radiculopathy, lumbar region: Principal | ICD-10-CM | POA: Insufficient documentation

## 2016-01-13 DIAGNOSIS — M5136 Other intervertebral disc degeneration, lumbar region: Secondary | ICD-10-CM | POA: Diagnosis not present

## 2016-01-13 DIAGNOSIS — M5416 Radiculopathy, lumbar region: Secondary | ICD-10-CM | POA: Diagnosis not present

## 2016-01-13 LAB — TYPE AND SCREEN
ABO/RH(D): A POS
Antibody Screen: POSITIVE

## 2016-01-13 SURGERY — POSTERIOR LUMBAR FUSION 1 LEVEL
Anesthesia: General | Site: Spine Lumbar

## 2016-01-13 MED ORDER — ONDANSETRON HCL 4 MG/2ML IJ SOLN
INTRAMUSCULAR | Status: DC | PRN
  Administered 2016-01-13: 4 mg via INTRAVENOUS

## 2016-01-13 MED ORDER — FENTANYL CITRATE (PF) 100 MCG/2ML IJ SOLN
INTRAMUSCULAR | Status: AC
  Filled 2016-01-13: qty 2

## 2016-01-13 MED ORDER — MENTHOL 3 MG MT LOZG: 1.0000 | LOZENGE | OROMUCOSAL | Status: DC | PRN

## 2016-01-13 MED ORDER — BUPIVACAINE HCL (PF) 0.5 % IJ SOLN
INTRAMUSCULAR | Status: DC | PRN
  Administered 2016-01-13: 10 mL

## 2016-01-13 MED ORDER — HYDROCODONE-ACETAMINOPHEN 5-325 MG PO TABS
1.0000 | ORAL_TABLET | ORAL | Status: DC | PRN
  Administered 2016-01-13 – 2016-01-14 (×4): 2 via ORAL
  Filled 2016-01-13 (×4): qty 2

## 2016-01-13 MED ORDER — ROCURONIUM BROMIDE 100 MG/10ML IV SOLN
INTRAVENOUS | Status: DC | PRN
  Administered 2016-01-13: 10 mg via INTRAVENOUS
  Administered 2016-01-13: 50 mg via INTRAVENOUS
  Administered 2016-01-13 (×2): 10 mg via INTRAVENOUS

## 2016-01-13 MED ORDER — HYDROXYZINE HCL 50 MG/ML IM SOLN: 50.0000 mg | INTRAMUSCULAR | Status: DC | PRN

## 2016-01-13 MED ORDER — LACTATED RINGERS IV SOLN
INTRAVENOUS | Status: DC | PRN
  Administered 2016-01-13 (×2): via INTRAVENOUS

## 2016-01-13 MED ORDER — ONDANSETRON HCL 4 MG PO TABS: 4.0000 mg | ORAL_TABLET | Freq: Four times a day (QID) | ORAL | Status: DC | PRN

## 2016-01-13 MED ORDER — ACETAMINOPHEN 325 MG PO TABS: 650.0000 mg | ORAL_TABLET | ORAL | Status: DC | PRN

## 2016-01-13 MED ORDER — MORPHINE SULFATE (PF) 4 MG/ML IV SOLN: 4.0000 mg | INTRAVENOUS | Status: DC | PRN

## 2016-01-13 MED ORDER — KETOROLAC TROMETHAMINE 30 MG/ML IJ SOLN
15.0000 mg | Freq: Once | INTRAMUSCULAR | Status: AC
  Administered 2016-01-13: 15 mg via INTRAVENOUS

## 2016-01-13 MED ORDER — PHENYLEPHRINE HCL 10 MG/ML IJ SOLN
INTRAVENOUS | Status: DC | PRN
  Administered 2016-01-13: 20 ug/min via INTRAVENOUS

## 2016-01-13 MED ORDER — HYDROXYZINE HCL 25 MG PO TABS: 50.0000 mg | ORAL_TABLET | ORAL | Status: DC | PRN

## 2016-01-13 MED ORDER — THROMBIN 5000 UNITS EX SOLR
OROMUCOSAL | Status: DC | PRN
  Administered 2016-01-13: 5 mL via TOPICAL

## 2016-01-13 MED ORDER — ACETAMINOPHEN 650 MG RE SUPP: 650.0000 mg | RECTAL | Status: DC | PRN

## 2016-01-13 MED ORDER — MEPERIDINE HCL 25 MG/ML IJ SOLN: 6.2500 mg | INTRAMUSCULAR | Status: DC | PRN

## 2016-01-13 MED ORDER — ROCURONIUM BROMIDE 10 MG/ML (PF) SYRINGE
PREFILLED_SYRINGE | INTRAVENOUS | Status: AC
  Filled 2016-01-13: qty 10

## 2016-01-13 MED ORDER — PROPOFOL 10 MG/ML IV BOLUS
INTRAVENOUS | Status: DC | PRN
  Administered 2016-01-13: 30 mg via INTRAVENOUS
  Administered 2016-01-13: 120 mg via INTRAVENOUS

## 2016-01-13 MED ORDER — ACETAMINOPHEN 10 MG/ML IV SOLN
INTRAVENOUS | Status: AC
  Filled 2016-01-13: qty 100

## 2016-01-13 MED ORDER — HYDROMORPHONE HCL 1 MG/ML IJ SOLN
0.2500 mg | INTRAMUSCULAR | Status: DC | PRN
  Administered 2016-01-13 (×2): 0.5 mg via INTRAVENOUS

## 2016-01-13 MED ORDER — HYDROMORPHONE HCL 2 MG/ML IJ SOLN
INTRAMUSCULAR | Status: AC
  Filled 2016-01-13: qty 1

## 2016-01-13 MED ORDER — SODIUM CHLORIDE 0.9% FLUSH: 3.0000 mL | INTRAVENOUS | Status: DC | PRN

## 2016-01-13 MED ORDER — MIDAZOLAM HCL 2 MG/2ML IJ SOLN: 0.5000 mg | Freq: Once | INTRAMUSCULAR | Status: DC | PRN

## 2016-01-13 MED ORDER — CEFAZOLIN SODIUM-DEXTROSE 2-4 GM/100ML-% IV SOLN
2.0000 g | INTRAVENOUS | Status: AC
  Administered 2016-01-13 (×2): 2 g via INTRAVENOUS

## 2016-01-13 MED ORDER — EPHEDRINE 5 MG/ML INJ
INTRAVENOUS | Status: AC
  Filled 2016-01-13: qty 10

## 2016-01-13 MED ORDER — MAGNESIUM HYDROXIDE 400 MG/5ML PO SUSP: 30.0000 mL | Freq: Every day | ORAL | Status: DC | PRN

## 2016-01-13 MED ORDER — ACETAMINOPHEN 10 MG/ML IV SOLN
INTRAVENOUS | Status: DC | PRN
  Administered 2016-01-13: 1000 mg via INTRAVENOUS

## 2016-01-13 MED ORDER — CYCLOBENZAPRINE HCL 10 MG PO TABS: 10.0000 mg | ORAL_TABLET | Freq: Three times a day (TID) | ORAL | Status: DC | PRN

## 2016-01-13 MED ORDER — TAMSULOSIN HCL 0.4 MG PO CAPS
0.4000 mg | ORAL_CAPSULE | Freq: Every day | ORAL | Status: DC
  Administered 2016-01-14: 0.4 mg via ORAL
  Filled 2016-01-13: qty 1

## 2016-01-13 MED ORDER — KETOROLAC TROMETHAMINE 30 MG/ML IJ SOLN
15.0000 mg | Freq: Four times a day (QID) | INTRAMUSCULAR | Status: DC
  Administered 2016-01-13 – 2016-01-14 (×4): 15 mg via INTRAVENOUS
  Filled 2016-01-13 (×4): qty 1

## 2016-01-13 MED ORDER — THROMBIN 20000 UNITS EX SOLR
CUTANEOUS | Status: DC | PRN
  Administered 2016-01-13: 20 mL via TOPICAL

## 2016-01-13 MED ORDER — ONDANSETRON HCL 4 MG/2ML IJ SOLN
INTRAMUSCULAR | Status: AC
  Filled 2016-01-13: qty 2

## 2016-01-13 MED ORDER — ALBUMIN HUMAN 5 % IV SOLN
INTRAVENOUS | Status: DC | PRN
  Administered 2016-01-13: 10:00:00 via INTRAVENOUS

## 2016-01-13 MED ORDER — PROMETHAZINE HCL 25 MG/ML IJ SOLN: 6.2500 mg | INTRAMUSCULAR | Status: DC | PRN

## 2016-01-13 MED ORDER — SUGAMMADEX SODIUM 200 MG/2ML IV SOLN
INTRAVENOUS | Status: DC | PRN
  Administered 2016-01-13: 150 mg via INTRAVENOUS

## 2016-01-13 MED ORDER — LIDOCAINE HCL (CARDIAC) 20 MG/ML IV SOLN
INTRAVENOUS | Status: DC | PRN
  Administered 2016-01-13: 30 mg via INTRAVENOUS

## 2016-01-13 MED ORDER — ALUM & MAG HYDROXIDE-SIMETH 200-200-20 MG/5ML PO SUSP: 30.0000 mL | Freq: Four times a day (QID) | ORAL | Status: DC | PRN

## 2016-01-13 MED ORDER — DEXTROSE-NACL 5-0.45 % IV SOLN: INTRAVENOUS | Status: DC

## 2016-01-13 MED ORDER — SODIUM CHLORIDE 0.9 % IR SOLN
Status: DC | PRN
  Administered 2016-01-13: 500 mL

## 2016-01-13 MED ORDER — SODIUM CHLORIDE 0.9 % IV SOLN: 250.0000 mL | INTRAVENOUS | Status: DC

## 2016-01-13 MED ORDER — OXYCODONE-ACETAMINOPHEN 5-325 MG PO TABS: 1.0000 | ORAL_TABLET | ORAL | Status: DC | PRN

## 2016-01-13 MED ORDER — FENTANYL CITRATE (PF) 100 MCG/2ML IJ SOLN
INTRAMUSCULAR | Status: DC | PRN
  Administered 2016-01-13: 25 ug via INTRAVENOUS
  Administered 2016-01-13 (×2): 50 ug via INTRAVENOUS
  Administered 2016-01-13: 100 ug via INTRAVENOUS

## 2016-01-13 MED ORDER — CEFAZOLIN SODIUM 1 G IJ SOLR
INTRAMUSCULAR | Status: AC
  Filled 2016-01-13: qty 20

## 2016-01-13 MED ORDER — BISACODYL 10 MG RE SUPP: 10.0000 mg | Freq: Every day | RECTAL | Status: DC | PRN

## 2016-01-13 MED ORDER — LIDOCAINE-EPINEPHRINE 1 %-1:100000 IJ SOLN
INTRAMUSCULAR | Status: DC | PRN
  Administered 2016-01-13: 10 mL

## 2016-01-13 MED ORDER — SODIUM CHLORIDE 0.9% FLUSH
3.0000 mL | Freq: Two times a day (BID) | INTRAVENOUS | Status: DC
  Administered 2016-01-14: 3 mL via INTRAVENOUS

## 2016-01-13 MED ORDER — THROMBIN 20000 UNITS EX SOLR
CUTANEOUS | Status: AC
  Filled 2016-01-13: qty 20000

## 2016-01-13 MED ORDER — EPHEDRINE SULFATE 50 MG/ML IJ SOLN
INTRAMUSCULAR | Status: DC | PRN
  Administered 2016-01-13 (×3): 10 mg via INTRAVENOUS

## 2016-01-13 MED ORDER — AMLODIPINE BESYLATE 5 MG PO TABS
5.0000 mg | ORAL_TABLET | Freq: Every evening | ORAL | Status: DC
  Filled 2016-01-13: qty 1

## 2016-01-13 MED ORDER — KETOROLAC TROMETHAMINE 15 MG/ML IJ SOLN
INTRAMUSCULAR | Status: AC
  Filled 2016-01-13: qty 1

## 2016-01-13 MED ORDER — PROPOFOL 10 MG/ML IV BOLUS
INTRAVENOUS | Status: AC
  Filled 2016-01-13: qty 20

## 2016-01-13 MED ORDER — PANTOPRAZOLE SODIUM 40 MG PO TBEC
40.0000 mg | DELAYED_RELEASE_TABLET | Freq: Every day | ORAL | Status: DC
  Administered 2016-01-14: 40 mg via ORAL
  Filled 2016-01-13: qty 1

## 2016-01-13 MED ORDER — PHENYLEPHRINE 40 MCG/ML (10ML) SYRINGE FOR IV PUSH (FOR BLOOD PRESSURE SUPPORT)
PREFILLED_SYRINGE | INTRAVENOUS | Status: AC
  Filled 2016-01-13: qty 10

## 2016-01-13 MED ORDER — CEFAZOLIN SODIUM-DEXTROSE 2-4 GM/100ML-% IV SOLN
INTRAVENOUS | Status: AC
  Filled 2016-01-13: qty 100

## 2016-01-13 MED ORDER — PHENOL 1.4 % MT LIQD: 1.0000 | OROMUCOSAL | Status: DC | PRN

## 2016-01-13 MED ORDER — LORAZEPAM 0.5 MG PO TABS: 0.5000 mg | ORAL_TABLET | Freq: Every day | ORAL | Status: DC | PRN

## 2016-01-13 MED ORDER — ONDANSETRON HCL 4 MG/2ML IJ SOLN: 4.0000 mg | Freq: Four times a day (QID) | INTRAMUSCULAR | Status: DC | PRN

## 2016-01-13 MED ORDER — SODIUM CHLORIDE 0.9 % IJ SOLN
INTRAMUSCULAR | Status: AC
  Filled 2016-01-13: qty 20

## 2016-01-13 MED ORDER — LIDOCAINE-EPINEPHRINE 1 %-1:100000 IJ SOLN
INTRAMUSCULAR | Status: AC
  Filled 2016-01-13: qty 1

## 2016-01-13 MED ORDER — THROMBIN 5000 UNITS EX SOLR
CUTANEOUS | Status: AC
  Filled 2016-01-13: qty 5000

## 2016-01-13 MED ORDER — HYDROMORPHONE HCL 1 MG/ML IJ SOLN
INTRAMUSCULAR | Status: AC
  Filled 2016-01-13: qty 0.5

## 2016-01-13 MED ORDER — LIDOCAINE 2% (20 MG/ML) 5 ML SYRINGE
INTRAMUSCULAR | Status: AC
  Filled 2016-01-13: qty 5

## 2016-01-13 MED ORDER — EZETIMIBE 10 MG PO TABS
10.0000 mg | ORAL_TABLET | Freq: Every evening | ORAL | Status: DC
  Administered 2016-01-13: 10 mg via ORAL
  Filled 2016-01-13 (×2): qty 1

## 2016-01-13 MED ORDER — 0.9 % SODIUM CHLORIDE (POUR BTL) OPTIME
TOPICAL | Status: DC | PRN
  Administered 2016-01-13: 1000 mL

## 2016-01-13 MED ORDER — BENAZEPRIL HCL 20 MG PO TABS
20.0000 mg | ORAL_TABLET | Freq: Every day | ORAL | Status: DC
  Administered 2016-01-14: 20 mg via ORAL
  Filled 2016-01-13 (×2): qty 1

## 2016-01-13 MED ORDER — GABAPENTIN 300 MG PO CAPS
300.0000 mg | ORAL_CAPSULE | Freq: Three times a day (TID) | ORAL | Status: DC
  Administered 2016-01-13 – 2016-01-14 (×3): 300 mg via ORAL
  Filled 2016-01-13 (×3): qty 1

## 2016-01-13 MED ORDER — PHENYLEPHRINE HCL 10 MG/ML IJ SOLN
INTRAMUSCULAR | Status: DC | PRN
  Administered 2016-01-13 (×3): 80 ug via INTRAVENOUS

## 2016-01-13 MED ORDER — ZOLPIDEM TARTRATE 5 MG PO TABS: 5.0000 mg | ORAL_TABLET | Freq: Every evening | ORAL | Status: DC | PRN

## 2016-01-13 MED FILL — Sodium Chloride IV Soln 0.9%: INTRAVENOUS | Qty: 2000 | Status: AC

## 2016-01-13 MED FILL — Heparin Sodium (Porcine) Inj 1000 Unit/ML: INTRAMUSCULAR | Qty: 30 | Status: AC

## 2016-01-13 SURGICAL SUPPLY — 70 items
ADH SKN CLS APL DERMABOND .7 (GAUZE/BANDAGES/DRESSINGS) ×2
BAG DECANTER FOR FLEXI CONT (MISCELLANEOUS) ×2 IMPLANT
BLADE CLIPPER SURG (BLADE) ×1 IMPLANT
BUR ACRON 5.0MM COATED (BURR) ×2 IMPLANT
BUR MATCHSTICK NEURO 3.0 LAGG (BURR) ×2 IMPLANT
CANISTER SUCT 3000ML PPV (MISCELLANEOUS) ×2 IMPLANT
CAP LCK SPNE (Orthopedic Implant) ×4 IMPLANT
CAP LOCK SPINE RADIUS (Orthopedic Implant) IMPLANT
CAP LOCKING (Orthopedic Implant) ×8 IMPLANT
CONT SPEC 4OZ CLIKSEAL STRL BL (MISCELLANEOUS) ×2 IMPLANT
COVER BACK TABLE 60X90IN (DRAPES) ×2 IMPLANT
DERMABOND ADVANCED (GAUZE/BANDAGES/DRESSINGS) ×2
DERMABOND ADVANCED .7 DNX12 (GAUZE/BANDAGES/DRESSINGS) ×2 IMPLANT
DRAPE C-ARM 42X72 X-RAY (DRAPES) ×4 IMPLANT
DRAPE HALF SHEET 40X57 (DRAPES) ×1 IMPLANT
DRAPE LAPAROTOMY 100X72X124 (DRAPES) ×2 IMPLANT
DRAPE POUCH INSTRU U-SHP 10X18 (DRAPES) ×2 IMPLANT
ELECT REM PT RETURN 9FT ADLT (ELECTROSURGICAL) ×2
ELECTRODE REM PT RTRN 9FT ADLT (ELECTROSURGICAL) ×1 IMPLANT
GAUZE SPONGE 4X4 12PLY STRL (GAUZE/BANDAGES/DRESSINGS) ×2 IMPLANT
GAUZE SPONGE 4X4 16PLY XRAY LF (GAUZE/BANDAGES/DRESSINGS) IMPLANT
GLOVE BIO SURGEON STRL SZ7 (GLOVE) ×1 IMPLANT
GLOVE BIOGEL PI IND STRL 7.0 (GLOVE) IMPLANT
GLOVE BIOGEL PI IND STRL 8 (GLOVE) ×2 IMPLANT
GLOVE BIOGEL PI INDICATOR 7.0 (GLOVE) ×1
GLOVE BIOGEL PI INDICATOR 8 (GLOVE) ×5
GLOVE ECLIPSE 7.5 STRL STRAW (GLOVE) ×7 IMPLANT
GLOVE ECLIPSE 8.5 STRL (GLOVE) ×1 IMPLANT
GLOVE SURG SS PI 7.0 STRL IVOR (GLOVE) ×4 IMPLANT
GOWN STRL REUS W/ TWL LRG LVL3 (GOWN DISPOSABLE) ×1 IMPLANT
GOWN STRL REUS W/ TWL XL LVL3 (GOWN DISPOSABLE) ×1 IMPLANT
GOWN STRL REUS W/TWL 2XL LVL3 (GOWN DISPOSABLE) ×2 IMPLANT
GOWN STRL REUS W/TWL LRG LVL3 (GOWN DISPOSABLE) ×4
GOWN STRL REUS W/TWL XL LVL3 (GOWN DISPOSABLE) ×6
HEMOSTAT POWDER KIT SURGIFOAM (HEMOSTASIS) ×1 IMPLANT
KIT BASIN OR (CUSTOM PROCEDURE TRAY) ×2 IMPLANT
KIT INFUSE X SMALL 1.4CC (Orthopedic Implant) ×1 IMPLANT
KIT ROOM TURNOVER OR (KITS) ×2 IMPLANT
NDL ASP BONE MRW 8GX15 (NEEDLE) IMPLANT
NDL SPNL 18GX3.5 QUINCKE PK (NEEDLE) IMPLANT
NDL SPNL 22GX3.5 QUINCKE BK (NEEDLE) ×2 IMPLANT
NEEDLE ASP BONE MRW 8GX15 (NEEDLE) ×2 IMPLANT
NEEDLE SPNL 18GX3.5 QUINCKE PK (NEEDLE) ×2 IMPLANT
NEEDLE SPNL 22GX3.5 QUINCKE BK (NEEDLE) ×4 IMPLANT
NS IRRIG 1000ML POUR BTL (IV SOLUTION) ×2 IMPLANT
PACK LAMINECTOMY NEURO (CUSTOM PROCEDURE TRAY) ×2 IMPLANT
PAD ARMBOARD 7.5X6 YLW CONV (MISCELLANEOUS) ×8 IMPLANT
PATTIES SURGICAL .5 X.5 (GAUZE/BANDAGES/DRESSINGS) IMPLANT
PATTIES SURGICAL .5 X1 (DISPOSABLE) IMPLANT
PATTIES SURGICAL 1X1 (DISPOSABLE) IMPLANT
PEEK PLIF AVS 11X25X4 (Peek) ×2 IMPLANT
ROD 5.5X30MM (Rod) ×1 IMPLANT
ROD RADIUS 35MM (Rod) ×1 IMPLANT
SCREW 5.75 X 635 (Screw) ×1 IMPLANT
SCREW 5.75X40M (Screw) ×3 IMPLANT
SPONGE LAP 4X18 X RAY DECT (DISPOSABLE) IMPLANT
SPONGE NEURO XRAY DETECT 1X3 (DISPOSABLE) IMPLANT
SPONGE SURGIFOAM ABS GEL 100 (HEMOSTASIS) ×2 IMPLANT
STRIP BIOACTIVE VITOSS 25X100X (Neuro Prosthesis/Implant) ×1 IMPLANT
STRIP BIOACTIVE VITOSS 25X52X4 (Orthopedic Implant) ×1 IMPLANT
SUT VIC AB 1 CT1 18XBRD ANBCTR (SUTURE) ×2 IMPLANT
SUT VIC AB 1 CT1 8-18 (SUTURE) ×6
SUT VIC AB 2-0 CP2 18 (SUTURE) ×4 IMPLANT
SYR 3ML LL SCALE MARK (SYRINGE) ×4 IMPLANT
SYR CONTROL 10ML LL (SYRINGE) ×2 IMPLANT
TAPE CLOTH SURG 4X10 WHT LF (GAUZE/BANDAGES/DRESSINGS) ×1 IMPLANT
TOWEL OR 17X24 6PK STRL BLUE (TOWEL DISPOSABLE) ×2 IMPLANT
TOWEL OR 17X26 10 PK STRL BLUE (TOWEL DISPOSABLE) ×2 IMPLANT
TRAY FOLEY W/METER SILVER 16FR (SET/KITS/TRAYS/PACK) ×2 IMPLANT
WATER STERILE IRR 1000ML POUR (IV SOLUTION) ×2 IMPLANT

## 2016-01-13 NOTE — Op Note (Signed)
**Note Ruben-Identified via Obfuscation** 01/13/2016  11:56 AM  PATIENT:  Ruben Barrera  82 y.o. male  PRE-OPERATIVE DIAGNOSIS:  Recurrent left L4-5 lumbar disc herniation, lumbar stenosis, lumbar spondylosis, lumbar degenerative disc disease, lumbar radiculopathy  POST-OPERATIVE DIAGNOSIS:  Recurrent left L4-5 lumbar disc herniation, lumbar stenosis, lumbar spondylosis, lumbar degenerative disc disease, lumbar radiculopathy  PROCEDURE:  Procedure(s):  Bilateral L4-5 lumbar laminectomy, facetectomy, foraminotomy, and microdiscectomy with decompression of the stenotic compression of the exiting L4 and L5 nerve roots, with decompression beyond that required for interbody arthrodesis; bilateral L4-5 posterior lumbar interbody arthrodesis with AVS peek interbody implants, Vitoss BA with bone marrow aspirate, and infuse; bilateral L4-5 posterior lateral arthrodesis with radius nonsegmental posterior instrumentation, locally harvested morcellized autograft, Vitoss BA with bone marrow aspirate, and infuse  SURGEON:  Surgeon(s): Jovita Gamma, MD Earnie Larsson, MD  ASSISTANTS: Earnie Larsson, M.D.  ANESTHESIA:   general  EBL:  Total I/O In: 1250 [I.V.:1000; IV Piggyback:250] Out: 550 [Urine:300; Blood:250]  BLOOD ADMINISTERED:none  CELL SAVER GIVEN: Cell Saver technician felt that there was insufficient blood loss to be able to return any to the patient  COUNT: Correct per nursing staff  DICTATION: Patient is brought to the operating room placed under general endotracheal anesthesia. The patient was turned to prone position the lumbar region was prepped with Betadine soap and solution and draped in a sterile fashion. The midline was infiltrated with local anesthesia with epinephrine. An x-ray was taken for localization. A midline incision was made through the previous midline incision, and carried down through the subcutaneous tissue.  Bipolar cautery and electrocautery were used to maintain hemostasis. Dissection was carried down to the  lumbar fascia. The fascia was incised bilaterally and the paraspinal muscles were dissected with a spinous process and lamina in a subperiosteal fashion. Scar tissue around the previous bilateral L4-5 laminectomy was carefully dissected, and the L4-5 level was localized. Dissection was then carried out laterally over the facet complex and the transverse processes of L4 and L5 were exposed and decorticated.   We then carefully dissected the epidural scar from the margins of the previous laminectomy, and extended the laminectomy using the high-speed drill and Kerrison punches. Dissection was carried out laterally including facetectomy and foraminotomies with decompression of the stenotic compression of the L4 and L5 nerve roots. We defined the thecal sac and identified the annulus bilaterally. The annulus was incised bilaterally and the disc space entered. A thorough discectomy was performed using pituitary rongeurs and curettes. Particular attention was paid to the left L4-5 neural foramen and extraforaminal space to ensure that the discectomy was completed and good decompression was achieved. Once the discectomy was completed we proceeded with the posterior lumbar interbody arthrodesis.  We began to prepare the endplate surfaces removing the cartilaginous endplates surface with curettes. We then measured the height of the intervertebral disc space. We selected 11 x 25 x 4 AVS peek interbody implants.  The C-arm fluoroscope was then draped and brought in the field and we identified the pedicle entry points bilaterally at the L4 and L5 levels. Each of the 4 pedicles was probed, we aspirated bone marrow aspirate from the vertebral bodies, this was injected over a 10 cc and a 5 cc strip of Vitoss BA. Then each of the pedicles was examined with the ball probe good bony surfaces were found. Each of the pedicles was then tapped with a 5.25 mm tap, again examined with the ball probe good threading was found. We then  placed 5.75 by 40 millimeter  screws bilaterally at the L4 level and on the right side of L5. We placed a 5.75 x 35 mm screw on the left side at L5  We then packed the AVS peek interbody implants with Vitoss BA with bone marrow aspirate and infuse, and then placed the first implant and on the right side, carefully retracting the thecal sac and nerve root medially. We then went back to the left side and packed the midline with additional Vitoss BA with bone marrow aspirate and infuse, and then placed a second implant and on the left side again retracting the thecal sac and nerve root medially.   We then packed the lateral gutter over the transverse processes and intertransverse space with locally harvested morcellized autograft, Vitoss BA with bone marrow aspirate, and infuse. We then selected pre-lordosed rods, using a 30 mm rod on the left and a 35 mm rod on the right, they were placed within the screw heads and secured with locking caps once all 4 locking caps were placed final tightening was performed against a counter torque.  The wound had been irrigated multiple times during the procedure with saline solution and bacitracin solution, good hemostasis was established with a combination of bipolar cautery, Gelfoam with thrombin, and Surgifoam. Once good hemostasis was confirmed we proceeded with closure paraspinal muscles deep fascia and Scarpa's fascia were closed with interrupted undyed 1 Vicryl sutures the subcutaneous and subcuticular closed with interrupted inverted 2-0 undyed Vicryl sutures the skin edges were approximated with Dermabond. A dressing of sterile gauze and Hypafix was applied.  Following surgery the patient was turned back to the supine position to be reversed and the anesthetic extubated and transferred to the recovery room for further care.  PLAN OF CARE: Admit to inpatient   PATIENT DISPOSITION:  PACU - hemodynamically stable.   Delay start of Pharmacological VTE agent (>24hrs)  due to surgical blood loss or risk of bleeding:  yes

## 2016-01-13 NOTE — Anesthesia Postprocedure Evaluation (Signed)
Anesthesia Post Note  Patient: HASHIR OSTROWSKY  Procedure(s) Performed: Procedure(s) (LRB): LUMBAR FOUR-FIVE DECOMPRESSION,POSTERIOR LUMBAR INTERBODY FUSION, POSTERIOR LATEREAL ARTHRODESIS (N/A)  Patient location during evaluation: PACU Anesthesia Type: General Level of consciousness: oriented, sedated and patient cooperative Pain management: pain level controlled Vital Signs Assessment: post-procedure vital signs reviewed and stable Respiratory status: spontaneous breathing, nonlabored ventilation, respiratory function stable and patient connected to nasal cannula oxygen Cardiovascular status: blood pressure returned to baseline and stable Postop Assessment: no signs of nausea or vomiting Anesthetic complications: no    Last Vitals:  Vitals:   01/13/16 1415 01/13/16 1700  BP: 129/81 118/63  Pulse: 97 97  Resp: 18 20  Temp: 36.5 C 36.5 C    Last Pain:  Vitals:   01/13/16 1315  TempSrc:   PainSc: 10-Worst pain ever                 Averie Hornbaker,E. Deepak Bless

## 2016-01-13 NOTE — Progress Notes (Signed)
Vitals:   01/13/16 1345 01/13/16 1357 01/13/16 1415 01/13/16 1700  BP:  127/70 129/81 118/63  Pulse: 98 96 97 97  Resp: 18 12 18 20   Temp: 98.2 F (36.8 C)  97.7 F (36.5 C) 97.7 F (36.5 C)  TempSrc:      SpO2: 94% 92% 95% 97%    Patient sitting up in chair, has been ambulating in the halls. Comfortable. Hasn't used any pain medication yet. Dressing clean and dry. Foley DC'd, nursing staff monitoring voiding function.  Plan: Doing well following surgery today. Encouraged to ambulate. Will continue to progress through postoperative recovery.  Hosie Spangle, MD 01/13/2016, 6:34 PM

## 2016-01-13 NOTE — Transfer of Care (Signed)
Immediate Anesthesia Transfer of Care Note  Patient: Ruben Barrera  Procedure(s) Performed: Procedure(s): LUMBAR FOUR-FIVE DECOMPRESSION,POSTERIOR LUMBAR INTERBODY FUSION, POSTERIOR LATEREAL ARTHRODESIS (N/A)  Patient Location: PACU  Anesthesia Type:General  Level of Consciousness: lethargic and responds to stimulation  Airway & Oxygen Therapy: Patient Spontanous Breathing and Patient connected to nasal cannula oxygen  Post-op Assessment: Report given to RN and Patient moving all extremities X 4  Post vital signs: Reviewed and stable  Last Vitals:  Vitals:   01/13/16 0633  BP: (!) 160/91  Pulse: 89  Resp: 20  Temp: 36.6 C    Last Pain:  Vitals:   01/13/16 0633  TempSrc: Oral  PainSc:          Complications: No apparent anesthesia complications

## 2016-01-13 NOTE — Anesthesia Procedure Notes (Signed)
Procedure Name: Intubation Date/Time: 01/13/2016 7:40 AM Performed by: Barrington Ellison Pre-anesthesia Checklist: Patient identified, Emergency Drugs available, Suction available and Patient being monitored Patient Re-evaluated:Patient Re-evaluated prior to inductionOxygen Delivery Method: Circle System Utilized Preoxygenation: Pre-oxygenation with 100% oxygen Intubation Type: IV induction Ventilation: Mask ventilation without difficulty Laryngoscope Size: Mac and 3 Grade View: Grade II Tube type: Oral Tube size: 7.5 mm Number of attempts: 1 Airway Equipment and Method: Stylet and Oral airway Placement Confirmation: ETT inserted through vocal cords under direct vision,  positive ETCO2 and breath sounds checked- equal and bilateral Secured at: 22 cm Tube secured with: Tape Dental Injury: Teeth and Oropharynx as per pre-operative assessment

## 2016-01-13 NOTE — H&P (Signed)
Subjective: Patient is a 82 y.o. male who is admitted for treatment of large recurrent left L4-5 lumbar disc herniation, L4-5 dynamic spondylolisthesis, and disabling left lumbar radiculopathy. Patient is 4 months status post a L4 and L5 decompressive lumbar laminectomy and a left L4-5 microdiscectomy. He did well for about a month after surgery and then developed disabling recurrent left lumbar radicular pain. He's been found by MRI scan to have a large recurrent left L4-5 lumbar dislocation. X-rays demonstrated interval development of a L4-5 dynamic spondylolisthesis. Patient is admitted now for L4-5 lumbar decompression including laminectomy, facetectomy, and foraminotomy, along with microdiscectomy and L4-5 lumbar stabilization including posterior lumbar interbody arthrodesis and posterior lateral arthrodesis with interbody implants, posterior instrumentation, and bone graft.   Patient Active Problem List   Diagnosis Date Noted  . Lumbar stenosis with neurogenic claudication 08/28/2015  . DOE (dyspnea on exertion) 05/01/2014  . Essential hypertension 05/01/2014  . Left shoulder pain 05/05/2011   Past Medical History:  Diagnosis Date  . Arthritis   . Complication of anesthesia    "had the shivers" after surgery  . Depression   . Dysrhythmia    "extra heart beat"  . GERD (gastroesophageal reflux disease)   . History of hiatal hernia   . Hypercholesteremia   . Hypertension   . Lumbar spinal stenosis   . RLS (restless legs syndrome)    takes ativan as needed    Past Surgical History:  Procedure Laterality Date  . BACK SURGERY    . LUMBAR LAMINECTOMY/DECOMPRESSION MICRODISCECTOMY N/A 08/28/2015   Procedure: Lumbar Four-Five decompressive lumbar laminectomy;  Surgeon: Jovita Gamma, MD;  Location: [facility];  Service: Neurosurgery;  Laterality: N/A;  . REPLACEMENT TOTAL KNEE BILATERAL Bilateral 11/05/2003  . TOE AMPUTATION Right 2010   second toe  . TONSILLECTOMY       Prescriptions Prior to Admission  Medication Sig Dispense Refill Last Dose  . amLODipine (NORVASC) 10 MG tablet Take 5 mg by mouth every evening.    01/13/2016 at Unknown time  . aspirin EC 81 MG tablet Take 81 mg by mouth daily.   5-6 days  . benazepril (LOTENSIN) 20 MG tablet Take 20 mg by mouth every morning.    01/12/2016 at Unknown time  . celecoxib (CELEBREX) 200 MG capsule Take 200 mg by mouth daily.    5 days  . diclofenac sodium (VOLTAREN) 1 % GEL Apply 1 application topically 2 (two) times daily as needed. General pain   week  . ezetimibe (ZETIA) 10 MG tablet Take 10 mg by mouth every evening.    01/12/2016 at Unknown time  . gabapentin (NEURONTIN) 300 MG capsule Take 300 mg by mouth 2 (two) times daily.   01/13/2016 at Unknown time  . HYDROcodone-acetaminophen (NORCO) 7.5-325 MG tablet Take 1 tablet by mouth daily as needed. For pain.   week  . LORazepam (ATIVAN) 1 MG tablet Take 0.5 mg by mouth daily as needed for anxiety.   01/13/2016 at Unknown time  . MULTIPLE VITAMIN PO Take 1 tablet by mouth daily.    5 days  . omeprazole (PRILOSEC) 20 MG capsule Take 20 mg by mouth daily.   01/13/2016 at Unknown time  . tamsulosin (FLOMAX) 0.4 MG CAPS capsule Take 0.4 mg by mouth daily.    01/13/2016 at Unknown time  . traMADol (ULTRAM) 50 MG tablet Take 100 mg by mouth every 4 (four) hours as needed for moderate pain.    01/13/2016 at Unknown time  . zolpidem (AMBIEN)  10 MG tablet Take 5 mg by mouth at bedtime as needed for sleep.   01/12/2016 at Unknown time  . Cyanocobalamin (VITAMIN B-12 PO) Take 500 mcg by mouth daily.    Unknown at Unknown time  . fluticasone (FLONASE) 50 MCG/ACT nasal spray Place 1 spray into both nostrils daily as needed for allergies.    More than a month at Unknown time  . HYDROcodone-acetaminophen (NORCO/VICODIN) 5-325 MG tablet Take 1-2 tablets by mouth every 4 (four) hours as needed (mild pain). (Patient not taking: Reported on 01/01/2016) 60 tablet 0 Not Taking at  Unknown time   Allergies  Allergen Reactions  . Simvastatin Other (See Comments)    Leg pains   . Other Other (See Comments)    UNSPECIFIED REACTION  Reaction to anesthesia per patient    Social History  Substance Use Topics  . Smoking status: Current Every Day Smoker    Types: Cigars  . Smokeless tobacco: Never Used     Comment: quit 1972  . Alcohol use 0.0 oz/week     Comment: occasionally beer or scotch    Family History  Problem Relation Age of Onset  . Stroke Mother   . Hypertension      family history     Review of Systems A comprehensive review of systems was negative.  Objective: Vital signs in last 24 hours: Temp:  [97.9 F (36.6 C)] 97.9 F (36.6 C) (10/23 [id number]) Pulse Rate:  [89] 89 (10/23 0633) Resp:  [20] 20 (10/23 [id number]) BP: (160)/(91) 160/91 (10/23 0633) SpO2:  [95 %] 95 % (10/23 [id number])  EXAM: Patient well-developed well-nourished white male in no acute distress. Lungs are clear to auscultation , the patient has symmetrical respiratory excursion. Heart has a regular rate and rhythm normal S1 and S2 no murmur.   Abdomen is soft nontender nondistended bowel sounds are present. Extremity examination shows no clubbing cyanosis or edema. Motor examination shows 5 over 5 strength in the lower extremities including the iliopsoas quadriceps dorsiflexor extensor hallicus  longus and plantar flexor bilaterally. Sensation is intact to pinprick in the distal lower extremities. Reflexes are symmetrical bilaterally. No pathologic reflexes are present. Patient has a normal gait and stance.   Data Review:CBC    Component Value Date/Time   WBC 6.7 01/06/2016 1051   RBC 4.75 01/06/2016 1051   HGB 14.7 01/06/2016 1051   HCT 43.4 01/06/2016 1051   PLT 302 01/06/2016 1051   MCV 91.4 01/06/2016 1051   MCH 30.9 01/06/2016 1051   MCHC 33.9 01/06/2016 1051   RDW 13.2 01/06/2016 1051                          BMET    Component Value Date/Time   NA 137 01/06/2016 1051    K 4.5 01/06/2016 1051   CL 103 01/06/2016 1051   CO2 27 01/06/2016 1051   GLUCOSE 85 01/06/2016 1051   BUN 23 (H) 01/06/2016 1051   CREATININE 0.92 01/06/2016 1051   CALCIUM 9.1 01/06/2016 1051   GFRNONAA >60 01/06/2016 1051   GFRAA >60 01/06/2016 1051     Assessment/Plan: Patient with recurrent left lumbar radiculopathy secondary to a large left L4-5 recurrent disc herniation, status post laminectomy and discectomy, who has L4-5 dynamic spondylolisthesis. He is admitted now for L4-5 lumbar decompression and arthrodesis.  I've discussed with the patient the nature of his condition, the nature the surgical procedure, the typical length of surgery, hospital stay,  and overall recuperation, the limitations postoperatively, and risks of surgery. I discussed risks including risks of infection, bleeding, possibly need for transfusion, the risk of nerve root dysfunction with pain, weakness, numbness, or paresthesias, the risk of dural tear and CSF leakage and possible need for further surgery, the risk of failure of the arthrodesis and possibly for further surgery, the risk of anesthetic complications including myocardial infarction, stroke, pneumonia, and death. We discussed the need for postoperative immobilization in a lumbar brace. Understanding all this the patient does wish to proceed with surgery and is admitted for such.     Hosie Spangle, MD 01/13/2016 7:19 AM

## 2016-01-14 DIAGNOSIS — M5116 Intervertebral disc disorders with radiculopathy, lumbar region: Secondary | ICD-10-CM | POA: Diagnosis not present

## 2016-01-14 MED ORDER — HYDROCODONE-ACETAMINOPHEN 5-325 MG PO TABS: 1.0000 | ORAL_TABLET | ORAL | 0 refills | Status: DC | PRN

## 2016-01-14 NOTE — Progress Notes (Signed)
Vitals:   01/13/16 2000 01/13/16 2343 01/14/16 0400 01/14/16 0756  BP: 133/66 129/64 (!) 143/78 (!) 157/79  Pulse: 88 93 86 84  Resp: 20 20 18 20   Temp: 98.4 F (36.9 C) 98.5 F (36.9 C) 98.7 F (37.1 C) 98.6 F (37 C)  TempSrc: Oral Oral Oral Oral  SpO2: 97% 96% 98% 97%    Patient resting in bed, has been up and ambulating with the staff. Voiding well. Dressing clean and dry.  Plan: Continuing to progress. Encouraged to ambulate.  Hosie Spangle, MD 01/14/2016, 8:05 AM'

## 2016-01-14 NOTE — Progress Notes (Signed)
Pt and wife given D/C instructions with Rx, verbal understanding was provided. Pt's incision is clean and dry with no sign of infection. Pt's IV was removed prior to D/C. Pt D/C'd home via wheelchair @ 1550 per MD order. Pt is stable @ D/C and has no other needs at this time. Holli Humbles, RN

## 2016-01-14 NOTE — Discharge Summary (Signed)
Physician Discharge Summary  Patient ID: Ruben Barrera MRN: [id number] DOB/AGE: 1933/01/21 82 y.o.  Admit date: 01/13/2016 Discharge date: 01/14/2016  Admission Diagnoses:  Recurrent left L4-5 lumbar disc herniation, lumbar stenosis, lumbar spondylosis, lumbar degenerative disc disease, lumbar radiculopathy      Discharge Diagnoses:  Recurrent left L4-5 lumbar disc herniation, lumbar stenosis, lumbar spondylosis, lumbar degenerative disc disease, lumbar radiculopathy Active Problems:   HNP (herniated nucleus pulposus), lumbar   Discharged Condition: good  Hospital Course: Patient was admitted, underwent a bilateral L4-5 lumbar decompression and arthrodesis. Postoperatively he is done well. He is up and ambulating actively in the halls. He is voiding well. We changed his dressing, the incision is healing nicely. There is some minimal bruising in the adjacent soft tissues, but there is no swelling or drainage. He is being discharged home with instructions regarding wound care and activities. He is scheduled to follow-up with me in the office in 3 weeks, with x-rays that day.   Discharge Exam: Blood pressure (!) 133/98, pulse 79, temperature 98.5 F (36.9 C), temperature source Oral, resp. rate 20, SpO2 96 %.  Disposition: 01-Home or Self Care     Medication List    TAKE these medications   amLODipine 2.5 MG tablet Commonly known as:  NORVASC Take 2.5 mg by mouth every evening.   aspirin EC 81 MG tablet Take 81 mg by mouth daily.   benazepril 20 MG tablet Commonly known as:  LOTENSIN Take 20 mg by mouth every morning.   celecoxib 200 MG capsule Commonly known as:  CELEBREX Take 200 mg by mouth daily.   diclofenac sodium 1 % Gel Commonly known as:  VOLTAREN Apply 1 application topically 2 (two) times daily as needed. General pain   ezetimibe 10 MG tablet Commonly known as:  ZETIA Take 10 mg by mouth every evening.   fluticasone 50 MCG/ACT nasal spray Commonly known  as:  FLONASE Place 1 spray into both nostrils daily as needed for allergies.   gabapentin 300 MG capsule Commonly known as:  NEURONTIN Take 300 mg by mouth 2 (two) times daily.   HYDROcodone-acetaminophen 5-325 MG tablet Commonly known as:  NORCO/VICODIN Take 1-2 tablets by mouth every 4 (four) hours as needed (mild pain). What changed:  Another medication with the same name was added. Make sure you understand how and when to take each.   HYDROcodone-acetaminophen 7.5-325 MG tablet Commonly known as:  NORCO Take 1 tablet by mouth daily as needed. For pain. What changed:  Another medication with the same name was added. Make sure you understand how and when to take each.   HYDROcodone-acetaminophen 5-325 MG tablet Commonly known as:  NORCO/VICODIN Take 1-2 tablets by mouth every 4 (four) hours as needed (pain). What changed:  You were already taking a medication with the same name, and this prescription was added. Make sure you understand how and when to take each.   LORazepam 1 MG tablet Commonly known as:  ATIVAN Take 0.5 mg by mouth daily as needed for anxiety.   MULTIPLE VITAMIN PO Take 1 tablet by mouth daily.   omeprazole 20 MG capsule Commonly known as:  PRILOSEC Take 20 mg by mouth daily.   tamsulosin 0.4 MG Caps capsule Commonly known as:  FLOMAX Take 0.4 mg by mouth daily.   traMADol 50 MG tablet Commonly known as:  ULTRAM Take 100 mg by mouth every 4 (four) hours as needed for moderate pain.   VITAMIN B-12 PO Take 500 mcg by  mouth daily.   zolpidem 10 MG tablet Commonly known as:  AMBIEN Take 5 mg by mouth at bedtime as needed for sleep.        SignedHosie Spangle 01/14/2016, 3:20 PM

## 2016-01-14 NOTE — Discharge Instructions (Signed)

## 2016-01-15 LAB — TYPE AND SCREEN
ABO/RH(D): A POS
Antibody Screen: POSITIVE
DAT, IgG: POSITIVE
DAT, complement: NEGATIVE
Unit division: 0
Unit division: 0

## 2016-01-27 DIAGNOSIS — N401 Enlarged prostate with lower urinary tract symptoms: Secondary | ICD-10-CM | POA: Diagnosis not present

## 2016-01-27 DIAGNOSIS — R350 Frequency of micturition: Secondary | ICD-10-CM | POA: Diagnosis not present

## 2016-01-27 DIAGNOSIS — N5201 Erectile dysfunction due to arterial insufficiency: Secondary | ICD-10-CM | POA: Diagnosis not present

## 2016-02-03 DIAGNOSIS — M5126 Other intervertebral disc displacement, lumbar region: Secondary | ICD-10-CM | POA: Diagnosis not present

## 2016-02-18 DIAGNOSIS — R29898 Other symptoms and signs involving the musculoskeletal system: Secondary | ICD-10-CM | POA: Diagnosis not present

## 2016-02-21 DIAGNOSIS — I10 Essential (primary) hypertension: Secondary | ICD-10-CM | POA: Diagnosis not present

## 2016-02-21 DIAGNOSIS — K5900 Constipation, unspecified: Secondary | ICD-10-CM | POA: Diagnosis not present

## 2016-02-28 DIAGNOSIS — R29898 Other symptoms and signs involving the musculoskeletal system: Secondary | ICD-10-CM | POA: Diagnosis not present

## 2016-03-06 DIAGNOSIS — R29898 Other symptoms and signs involving the musculoskeletal system: Secondary | ICD-10-CM | POA: Diagnosis not present

## 2016-03-10 DIAGNOSIS — S91311A Laceration without foreign body, right foot, initial encounter: Secondary | ICD-10-CM | POA: Diagnosis not present

## 2016-03-13 DIAGNOSIS — R29898 Other symptoms and signs involving the musculoskeletal system: Secondary | ICD-10-CM | POA: Diagnosis not present

## 2016-03-26 DIAGNOSIS — R29898 Other symptoms and signs involving the musculoskeletal system: Secondary | ICD-10-CM | POA: Diagnosis not present

## 2016-04-03 DIAGNOSIS — Z981 Arthrodesis status: Secondary | ICD-10-CM | POA: Diagnosis not present

## 2016-04-03 DIAGNOSIS — R29898 Other symptoms and signs involving the musculoskeletal system: Secondary | ICD-10-CM | POA: Diagnosis not present

## 2016-04-10 DIAGNOSIS — R29898 Other symptoms and signs involving the musculoskeletal system: Secondary | ICD-10-CM | POA: Diagnosis not present

## 2016-04-17 DIAGNOSIS — R29898 Other symptoms and signs involving the musculoskeletal system: Secondary | ICD-10-CM | POA: Diagnosis not present

## 2016-04-24 DIAGNOSIS — R29898 Other symptoms and signs involving the musculoskeletal system: Secondary | ICD-10-CM | POA: Diagnosis not present

## 2016-04-29 DIAGNOSIS — H6122 Impacted cerumen, left ear: Secondary | ICD-10-CM | POA: Diagnosis not present

## 2016-04-29 DIAGNOSIS — Z23 Encounter for immunization: Secondary | ICD-10-CM | POA: Diagnosis not present

## 2016-04-29 DIAGNOSIS — R51 Headache: Secondary | ICD-10-CM | POA: Diagnosis not present

## 2016-04-29 DIAGNOSIS — F322 Major depressive disorder, single episode, severe without psychotic features: Secondary | ICD-10-CM | POA: Diagnosis not present

## 2016-04-29 DIAGNOSIS — Z79899 Other long term (current) drug therapy: Secondary | ICD-10-CM | POA: Diagnosis not present

## 2016-04-29 DIAGNOSIS — I10 Essential (primary) hypertension: Secondary | ICD-10-CM | POA: Diagnosis not present

## 2016-04-29 DIAGNOSIS — Z0000 Encounter for general adult medical examination without abnormal findings: Secondary | ICD-10-CM | POA: Diagnosis not present

## 2016-04-29 DIAGNOSIS — E7800 Pure hypercholesterolemia, unspecified: Secondary | ICD-10-CM | POA: Diagnosis not present

## 2016-04-29 DIAGNOSIS — G2581 Restless legs syndrome: Secondary | ICD-10-CM | POA: Diagnosis not present

## 2016-04-29 DIAGNOSIS — K219 Gastro-esophageal reflux disease without esophagitis: Secondary | ICD-10-CM | POA: Diagnosis not present

## 2016-05-04 DIAGNOSIS — R29898 Other symptoms and signs involving the musculoskeletal system: Secondary | ICD-10-CM | POA: Diagnosis not present

## 2016-05-22 DIAGNOSIS — R29898 Other symptoms and signs involving the musculoskeletal system: Secondary | ICD-10-CM | POA: Diagnosis not present

## 2016-06-09 DIAGNOSIS — R29898 Other symptoms and signs involving the musculoskeletal system: Secondary | ICD-10-CM | POA: Diagnosis not present

## 2016-07-02 DIAGNOSIS — L299 Pruritus, unspecified: Secondary | ICD-10-CM | POA: Diagnosis not present

## 2016-07-10 DIAGNOSIS — H5201 Hypermetropia, right eye: Secondary | ICD-10-CM | POA: Diagnosis not present

## 2016-07-10 DIAGNOSIS — G2581 Restless legs syndrome: Secondary | ICD-10-CM | POA: Diagnosis not present

## 2016-07-10 DIAGNOSIS — H524 Presbyopia: Secondary | ICD-10-CM | POA: Diagnosis not present

## 2016-07-10 DIAGNOSIS — H2513 Age-related nuclear cataract, bilateral: Secondary | ICD-10-CM | POA: Diagnosis not present

## 2016-07-10 DIAGNOSIS — H25013 Cortical age-related cataract, bilateral: Secondary | ICD-10-CM | POA: Diagnosis not present

## 2016-07-31 DIAGNOSIS — M5136 Other intervertebral disc degeneration, lumbar region: Secondary | ICD-10-CM | POA: Diagnosis not present

## 2016-07-31 DIAGNOSIS — M4726 Other spondylosis with radiculopathy, lumbar region: Secondary | ICD-10-CM | POA: Diagnosis not present

## 2016-07-31 DIAGNOSIS — Z6826 Body mass index (BMI) 26.0-26.9, adult: Secondary | ICD-10-CM | POA: Diagnosis not present

## 2016-07-31 DIAGNOSIS — Z981 Arthrodesis status: Secondary | ICD-10-CM | POA: Diagnosis not present

## 2016-08-07 DIAGNOSIS — B354 Tinea corporis: Secondary | ICD-10-CM | POA: Diagnosis not present

## 2016-08-07 DIAGNOSIS — D225 Melanocytic nevi of trunk: Secondary | ICD-10-CM | POA: Diagnosis not present

## 2016-08-07 DIAGNOSIS — L820 Inflamed seborrheic keratosis: Secondary | ICD-10-CM | POA: Diagnosis not present

## 2016-08-07 DIAGNOSIS — L821 Other seborrheic keratosis: Secondary | ICD-10-CM | POA: Diagnosis not present

## 2016-08-07 DIAGNOSIS — L570 Actinic keratosis: Secondary | ICD-10-CM | POA: Diagnosis not present

## 2016-08-10 DIAGNOSIS — H1132 Conjunctival hemorrhage, left eye: Secondary | ICD-10-CM | POA: Diagnosis not present

## 2016-08-25 DIAGNOSIS — R29898 Other symptoms and signs involving the musculoskeletal system: Secondary | ICD-10-CM | POA: Diagnosis not present

## 2016-09-16 DIAGNOSIS — Z471 Aftercare following joint replacement surgery: Secondary | ICD-10-CM | POA: Diagnosis not present

## 2016-09-16 DIAGNOSIS — M170 Bilateral primary osteoarthritis of knee: Secondary | ICD-10-CM | POA: Diagnosis not present

## 2016-09-16 DIAGNOSIS — M1712 Unilateral primary osteoarthritis, left knee: Secondary | ICD-10-CM | POA: Diagnosis not present

## 2016-09-16 DIAGNOSIS — M1711 Unilateral primary osteoarthritis, right knee: Secondary | ICD-10-CM | POA: Diagnosis not present

## 2016-09-16 DIAGNOSIS — Z96653 Presence of artificial knee joint, bilateral: Secondary | ICD-10-CM | POA: Diagnosis not present

## 2016-10-28 DIAGNOSIS — M549 Dorsalgia, unspecified: Secondary | ICD-10-CM | POA: Diagnosis not present

## 2016-10-28 DIAGNOSIS — I10 Essential (primary) hypertension: Secondary | ICD-10-CM | POA: Diagnosis not present

## 2016-11-19 DIAGNOSIS — F322 Major depressive disorder, single episode, severe without psychotic features: Secondary | ICD-10-CM | POA: Diagnosis not present

## 2016-11-19 DIAGNOSIS — I10 Essential (primary) hypertension: Secondary | ICD-10-CM | POA: Diagnosis not present

## 2016-11-19 DIAGNOSIS — N400 Enlarged prostate without lower urinary tract symptoms: Secondary | ICD-10-CM | POA: Diagnosis not present

## 2016-11-19 DIAGNOSIS — F321 Major depressive disorder, single episode, moderate: Secondary | ICD-10-CM | POA: Diagnosis not present

## 2016-12-03 DIAGNOSIS — Z23 Encounter for immunization: Secondary | ICD-10-CM | POA: Diagnosis not present

## 2016-12-29 DIAGNOSIS — L821 Other seborrheic keratosis: Secondary | ICD-10-CM | POA: Diagnosis not present

## 2016-12-29 DIAGNOSIS — L820 Inflamed seborrheic keratosis: Secondary | ICD-10-CM | POA: Diagnosis not present

## 2017-01-01 DIAGNOSIS — F5101 Primary insomnia: Secondary | ICD-10-CM | POA: Diagnosis not present

## 2017-01-01 DIAGNOSIS — Z79899 Other long term (current) drug therapy: Secondary | ICD-10-CM | POA: Diagnosis not present

## 2017-01-01 DIAGNOSIS — G8929 Other chronic pain: Secondary | ICD-10-CM | POA: Diagnosis not present

## 2017-01-01 DIAGNOSIS — M25511 Pain in right shoulder: Secondary | ICD-10-CM | POA: Diagnosis not present

## 2017-01-01 DIAGNOSIS — I10 Essential (primary) hypertension: Secondary | ICD-10-CM | POA: Diagnosis not present

## 2017-05-03 DIAGNOSIS — T783XXA Angioneurotic edema, initial encounter: Secondary | ICD-10-CM | POA: Diagnosis not present

## 2017-05-03 DIAGNOSIS — I10 Essential (primary) hypertension: Secondary | ICD-10-CM | POA: Diagnosis not present

## 2017-05-03 DIAGNOSIS — E7800 Pure hypercholesterolemia, unspecified: Secondary | ICD-10-CM | POA: Diagnosis not present

## 2017-05-03 DIAGNOSIS — Z0000 Encounter for general adult medical examination without abnormal findings: Secondary | ICD-10-CM | POA: Diagnosis not present

## 2017-05-03 DIAGNOSIS — M25512 Pain in left shoulder: Secondary | ICD-10-CM | POA: Diagnosis not present

## 2017-05-03 DIAGNOSIS — Z79899 Other long term (current) drug therapy: Secondary | ICD-10-CM | POA: Diagnosis not present

## 2017-05-03 DIAGNOSIS — G2581 Restless legs syndrome: Secondary | ICD-10-CM | POA: Diagnosis not present

## 2017-05-03 DIAGNOSIS — J3089 Other allergic rhinitis: Secondary | ICD-10-CM | POA: Diagnosis not present

## 2017-05-03 DIAGNOSIS — G4700 Insomnia, unspecified: Secondary | ICD-10-CM | POA: Diagnosis not present

## 2017-05-03 DIAGNOSIS — M25511 Pain in right shoulder: Secondary | ICD-10-CM | POA: Diagnosis not present

## 2017-05-03 DIAGNOSIS — E781 Pure hyperglyceridemia: Secondary | ICD-10-CM | POA: Diagnosis not present

## 2017-05-03 DIAGNOSIS — Z1389 Encounter for screening for other disorder: Secondary | ICD-10-CM | POA: Diagnosis not present

## 2017-05-03 DIAGNOSIS — F322 Major depressive disorder, single episode, severe without psychotic features: Secondary | ICD-10-CM | POA: Diagnosis not present

## 2017-05-03 DIAGNOSIS — K219 Gastro-esophageal reflux disease without esophagitis: Secondary | ICD-10-CM | POA: Diagnosis not present

## 2017-05-12 DIAGNOSIS — M25519 Pain in unspecified shoulder: Secondary | ICD-10-CM | POA: Diagnosis not present

## 2017-05-20 DIAGNOSIS — Z471 Aftercare following joint replacement surgery: Secondary | ICD-10-CM | POA: Diagnosis not present

## 2017-05-20 DIAGNOSIS — Z96653 Presence of artificial knee joint, bilateral: Secondary | ICD-10-CM | POA: Diagnosis not present

## 2017-05-20 DIAGNOSIS — M25561 Pain in right knee: Secondary | ICD-10-CM | POA: Diagnosis not present

## 2017-05-20 DIAGNOSIS — M25562 Pain in left knee: Secondary | ICD-10-CM | POA: Diagnosis not present

## 2017-05-26 DIAGNOSIS — M4155 Other secondary scoliosis, thoracolumbar region: Secondary | ICD-10-CM | POA: Diagnosis not present

## 2017-05-26 DIAGNOSIS — M5126 Other intervertebral disc displacement, lumbar region: Secondary | ICD-10-CM | POA: Diagnosis not present

## 2017-05-26 DIAGNOSIS — Z981 Arthrodesis status: Secondary | ICD-10-CM | POA: Diagnosis not present

## 2017-05-26 DIAGNOSIS — M5416 Radiculopathy, lumbar region: Secondary | ICD-10-CM | POA: Diagnosis not present

## 2017-05-26 DIAGNOSIS — M546 Pain in thoracic spine: Secondary | ICD-10-CM | POA: Diagnosis not present

## 2017-05-31 DIAGNOSIS — G4700 Insomnia, unspecified: Secondary | ICD-10-CM | POA: Diagnosis not present

## 2017-05-31 DIAGNOSIS — I10 Essential (primary) hypertension: Secondary | ICD-10-CM | POA: Diagnosis not present

## 2017-05-31 DIAGNOSIS — M5432 Sciatica, left side: Secondary | ICD-10-CM | POA: Diagnosis not present

## 2017-06-29 DIAGNOSIS — M5136 Other intervertebral disc degeneration, lumbar region: Secondary | ICD-10-CM | POA: Diagnosis not present

## 2017-08-05 DIAGNOSIS — H25011 Cortical age-related cataract, right eye: Secondary | ICD-10-CM | POA: Diagnosis not present

## 2017-08-05 DIAGNOSIS — H2511 Age-related nuclear cataract, right eye: Secondary | ICD-10-CM | POA: Diagnosis not present

## 2017-08-05 DIAGNOSIS — H25811 Combined forms of age-related cataract, right eye: Secondary | ICD-10-CM | POA: Diagnosis not present

## 2017-08-10 DIAGNOSIS — L720 Epidermal cyst: Secondary | ICD-10-CM | POA: Diagnosis not present

## 2017-08-10 DIAGNOSIS — D225 Melanocytic nevi of trunk: Secondary | ICD-10-CM | POA: Diagnosis not present

## 2017-08-10 DIAGNOSIS — B351 Tinea unguium: Secondary | ICD-10-CM | POA: Diagnosis not present

## 2017-08-10 DIAGNOSIS — L821 Other seborrheic keratosis: Secondary | ICD-10-CM | POA: Diagnosis not present

## 2017-08-10 DIAGNOSIS — D2262 Melanocytic nevi of left upper limb, including shoulder: Secondary | ICD-10-CM | POA: Diagnosis not present

## 2017-08-11 DIAGNOSIS — I10 Essential (primary) hypertension: Secondary | ICD-10-CM | POA: Diagnosis not present

## 2017-08-11 DIAGNOSIS — R42 Dizziness and giddiness: Secondary | ICD-10-CM | POA: Diagnosis not present

## 2017-08-31 DIAGNOSIS — N410 Acute prostatitis: Secondary | ICD-10-CM | POA: Diagnosis not present

## 2017-10-01 DIAGNOSIS — L603 Nail dystrophy: Secondary | ICD-10-CM | POA: Diagnosis not present

## 2017-10-01 DIAGNOSIS — I739 Peripheral vascular disease, unspecified: Secondary | ICD-10-CM | POA: Diagnosis not present

## 2017-10-18 DIAGNOSIS — N400 Enlarged prostate without lower urinary tract symptoms: Secondary | ICD-10-CM | POA: Diagnosis not present

## 2017-10-18 DIAGNOSIS — E781 Pure hyperglyceridemia: Secondary | ICD-10-CM | POA: Diagnosis not present

## 2017-10-18 DIAGNOSIS — I10 Essential (primary) hypertension: Secondary | ICD-10-CM | POA: Diagnosis not present

## 2017-10-18 DIAGNOSIS — F322 Major depressive disorder, single episode, severe without psychotic features: Secondary | ICD-10-CM | POA: Diagnosis not present

## 2017-11-11 DIAGNOSIS — M5136 Other intervertebral disc degeneration, lumbar region: Secondary | ICD-10-CM | POA: Diagnosis not present

## 2017-11-15 DIAGNOSIS — H25012 Cortical age-related cataract, left eye: Secondary | ICD-10-CM | POA: Diagnosis not present

## 2017-11-15 DIAGNOSIS — H2512 Age-related nuclear cataract, left eye: Secondary | ICD-10-CM | POA: Diagnosis not present

## 2017-11-16 DIAGNOSIS — E781 Pure hyperglyceridemia: Secondary | ICD-10-CM | POA: Diagnosis not present

## 2017-11-16 DIAGNOSIS — N400 Enlarged prostate without lower urinary tract symptoms: Secondary | ICD-10-CM | POA: Diagnosis not present

## 2017-11-16 DIAGNOSIS — I10 Essential (primary) hypertension: Secondary | ICD-10-CM | POA: Diagnosis not present

## 2017-11-16 DIAGNOSIS — F322 Major depressive disorder, single episode, severe without psychotic features: Secondary | ICD-10-CM | POA: Diagnosis not present

## 2017-11-17 IMAGING — RF DG C-ARM 61-120 MIN
1 series · 2 of 2 positions shown · non-contrast
Comparison: Intraoperative views same date.

CLINICAL DATA: L4-5 PLIF.

EXAM:
DG C-ARM 61-120 MIN; LUMBAR SPINE - 2-3 VIEW

[Series 1: run · 2 of 2 slices shown]
[im 1/2]
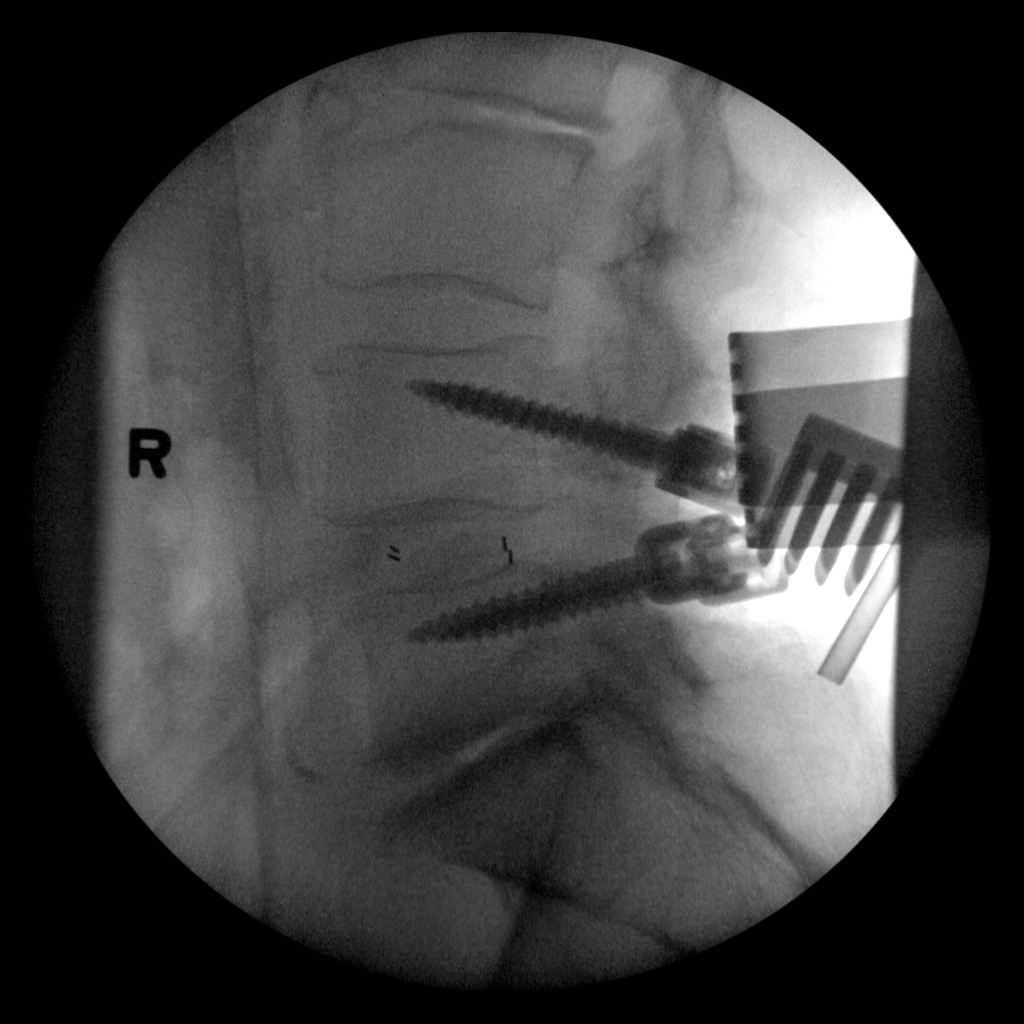
[im 2/2]
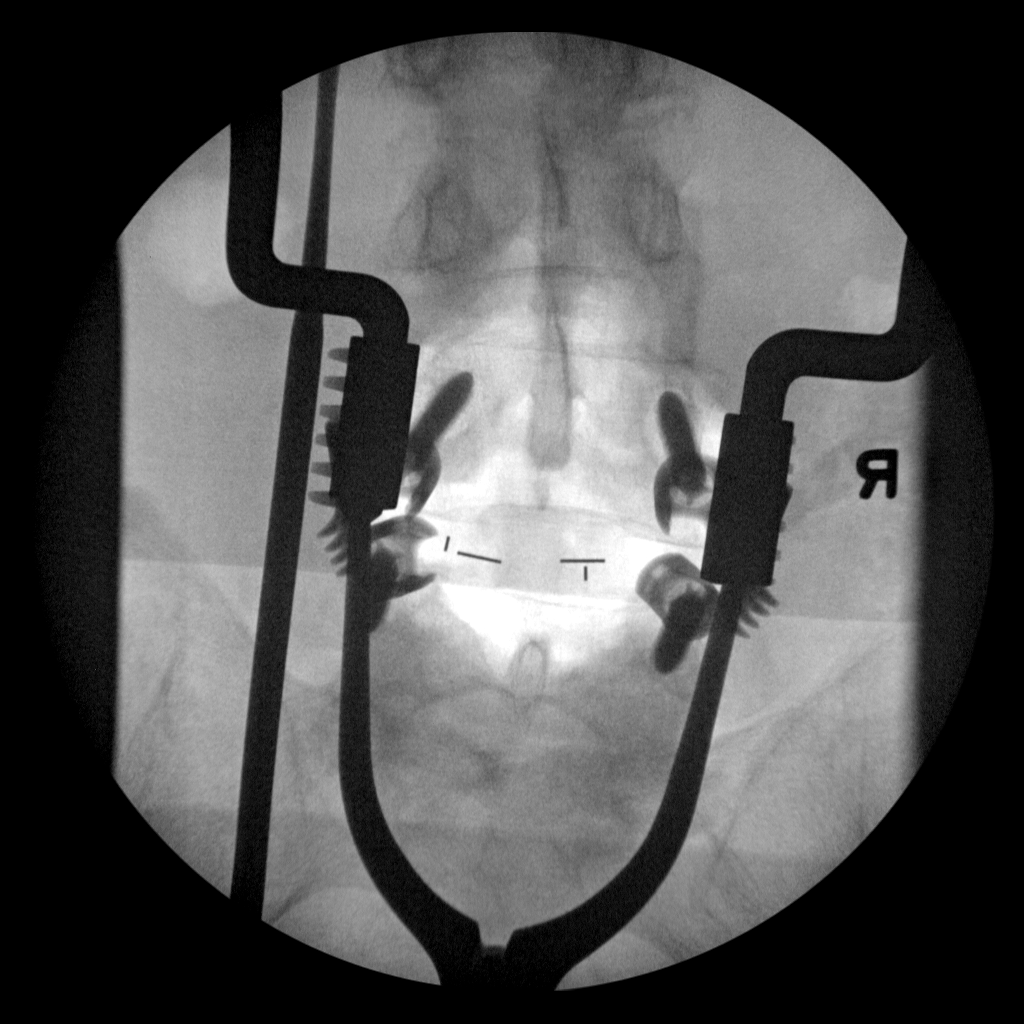

[2 of 2 positions shown; findings below may reference images not displayed]

Lumbar radiographs
12/04/2015.

FLUOROSCOPY TIME:  C-arm fluoroscopic images were obtained
intraoperatively and submitted for post operative interpretation.
Please see the performing provider's procedural report for the
fluoroscopy time utilized.
FINDINGS: Two spot fluoroscopic images are submitted from the operating room.
These demonstrate laminectomy at L4-5 with placement of an interbody
spacer and bilateral pedicle screws. The interconnecting rods are
not yet in position. No complications are identified.
IMPRESSION: Intraoperative views during L4-5 PLIF. No demonstrated complication.

## 2017-11-19 DIAGNOSIS — L259 Unspecified contact dermatitis, unspecified cause: Secondary | ICD-10-CM | POA: Diagnosis not present

## 2017-11-30 DIAGNOSIS — L309 Dermatitis, unspecified: Secondary | ICD-10-CM | POA: Diagnosis not present

## 2017-12-02 DIAGNOSIS — M25552 Pain in left hip: Secondary | ICD-10-CM | POA: Diagnosis not present

## 2017-12-02 DIAGNOSIS — J309 Allergic rhinitis, unspecified: Secondary | ICD-10-CM | POA: Diagnosis not present

## 2017-12-06 DIAGNOSIS — Z23 Encounter for immunization: Secondary | ICD-10-CM | POA: Diagnosis not present

## 2017-12-06 DIAGNOSIS — R05 Cough: Secondary | ICD-10-CM | POA: Diagnosis not present

## 2017-12-06 DIAGNOSIS — M25552 Pain in left hip: Secondary | ICD-10-CM | POA: Diagnosis not present

## 2017-12-06 DIAGNOSIS — Z79899 Other long term (current) drug therapy: Secondary | ICD-10-CM | POA: Diagnosis not present

## 2017-12-06 DIAGNOSIS — G4700 Insomnia, unspecified: Secondary | ICD-10-CM | POA: Diagnosis not present

## 2017-12-06 DIAGNOSIS — I10 Essential (primary) hypertension: Secondary | ICD-10-CM | POA: Diagnosis not present

## 2017-12-06 DIAGNOSIS — R21 Rash and other nonspecific skin eruption: Secondary | ICD-10-CM | POA: Diagnosis not present

## 2017-12-06 DIAGNOSIS — J309 Allergic rhinitis, unspecified: Secondary | ICD-10-CM | POA: Diagnosis not present

## 2017-12-14 DIAGNOSIS — M25552 Pain in left hip: Secondary | ICD-10-CM | POA: Diagnosis not present

## 2017-12-23 DIAGNOSIS — H25012 Cortical age-related cataract, left eye: Secondary | ICD-10-CM | POA: Diagnosis not present

## 2017-12-23 DIAGNOSIS — H25812 Combined forms of age-related cataract, left eye: Secondary | ICD-10-CM | POA: Diagnosis not present

## 2017-12-23 DIAGNOSIS — H2181 Floppy iris syndrome: Secondary | ICD-10-CM | POA: Diagnosis not present

## 2017-12-23 DIAGNOSIS — H2512 Age-related nuclear cataract, left eye: Secondary | ICD-10-CM | POA: Diagnosis not present

## 2018-02-11 DIAGNOSIS — E781 Pure hyperglyceridemia: Secondary | ICD-10-CM | POA: Diagnosis not present

## 2018-02-11 DIAGNOSIS — F322 Major depressive disorder, single episode, severe without psychotic features: Secondary | ICD-10-CM | POA: Diagnosis not present

## 2018-02-11 DIAGNOSIS — I10 Essential (primary) hypertension: Secondary | ICD-10-CM | POA: Diagnosis not present

## 2018-02-11 DIAGNOSIS — N400 Enlarged prostate without lower urinary tract symptoms: Secondary | ICD-10-CM | POA: Diagnosis not present

## 2018-02-25 DIAGNOSIS — S3421XA Injury of nerve root of lumbar spine, initial encounter: Secondary | ICD-10-CM | POA: Diagnosis not present

## 2018-02-25 DIAGNOSIS — Z96653 Presence of artificial knee joint, bilateral: Secondary | ICD-10-CM | POA: Diagnosis not present

## 2018-02-25 DIAGNOSIS — M25561 Pain in right knee: Secondary | ICD-10-CM | POA: Diagnosis not present

## 2018-03-01 DIAGNOSIS — Z981 Arthrodesis status: Secondary | ICD-10-CM | POA: Diagnosis not present

## 2018-03-01 DIAGNOSIS — M5136 Other intervertebral disc degeneration, lumbar region: Secondary | ICD-10-CM | POA: Diagnosis not present

## 2018-03-01 DIAGNOSIS — M4155 Other secondary scoliosis, thoracolumbar region: Secondary | ICD-10-CM | POA: Diagnosis not present

## 2018-03-01 DIAGNOSIS — M47816 Spondylosis without myelopathy or radiculopathy, lumbar region: Secondary | ICD-10-CM | POA: Diagnosis not present

## 2018-03-24 DIAGNOSIS — J40 Bronchitis, not specified as acute or chronic: Secondary | ICD-10-CM | POA: Diagnosis not present

## 2018-05-06 DIAGNOSIS — R1030 Lower abdominal pain, unspecified: Secondary | ICD-10-CM | POA: Diagnosis not present

## 2018-05-06 DIAGNOSIS — Z79899 Other long term (current) drug therapy: Secondary | ICD-10-CM | POA: Diagnosis not present

## 2018-05-06 DIAGNOSIS — E7800 Pure hypercholesterolemia, unspecified: Secondary | ICD-10-CM | POA: Diagnosis not present

## 2018-05-06 DIAGNOSIS — I10 Essential (primary) hypertension: Secondary | ICD-10-CM | POA: Diagnosis not present

## 2018-05-06 DIAGNOSIS — G4700 Insomnia, unspecified: Secondary | ICD-10-CM | POA: Diagnosis not present

## 2018-05-06 DIAGNOSIS — K219 Gastro-esophageal reflux disease without esophagitis: Secondary | ICD-10-CM | POA: Diagnosis not present

## 2018-05-06 DIAGNOSIS — Z1389 Encounter for screening for other disorder: Secondary | ICD-10-CM | POA: Diagnosis not present

## 2018-05-06 DIAGNOSIS — F322 Major depressive disorder, single episode, severe without psychotic features: Secondary | ICD-10-CM | POA: Diagnosis not present

## 2018-05-06 DIAGNOSIS — Z0000 Encounter for general adult medical examination without abnormal findings: Secondary | ICD-10-CM | POA: Diagnosis not present

## 2018-05-06 DIAGNOSIS — G2581 Restless legs syndrome: Secondary | ICD-10-CM | POA: Diagnosis not present

## 2018-07-13 DIAGNOSIS — G8929 Other chronic pain: Secondary | ICD-10-CM | POA: Diagnosis not present

## 2018-07-13 DIAGNOSIS — I10 Essential (primary) hypertension: Secondary | ICD-10-CM | POA: Diagnosis not present

## 2018-07-13 DIAGNOSIS — M25561 Pain in right knee: Secondary | ICD-10-CM | POA: Diagnosis not present

## 2018-07-13 DIAGNOSIS — G4489 Other headache syndrome: Secondary | ICD-10-CM | POA: Diagnosis not present

## 2018-07-13 DIAGNOSIS — M25562 Pain in left knee: Secondary | ICD-10-CM | POA: Diagnosis not present

## 2018-08-01 DIAGNOSIS — M545 Low back pain: Secondary | ICD-10-CM | POA: Diagnosis not present

## 2018-08-01 DIAGNOSIS — R410 Disorientation, unspecified: Secondary | ICD-10-CM | POA: Diagnosis not present

## 2018-08-01 DIAGNOSIS — R251 Tremor, unspecified: Secondary | ICD-10-CM | POA: Diagnosis not present

## 2018-08-01 DIAGNOSIS — K5901 Slow transit constipation: Secondary | ICD-10-CM | POA: Diagnosis not present

## 2018-08-01 DIAGNOSIS — G8929 Other chronic pain: Secondary | ICD-10-CM | POA: Diagnosis not present

## 2018-08-01 DIAGNOSIS — I10 Essential (primary) hypertension: Secondary | ICD-10-CM | POA: Diagnosis not present

## 2018-08-09 DIAGNOSIS — M47816 Spondylosis without myelopathy or radiculopathy, lumbar region: Secondary | ICD-10-CM | POA: Diagnosis not present

## 2018-08-09 DIAGNOSIS — M5416 Radiculopathy, lumbar region: Secondary | ICD-10-CM | POA: Diagnosis not present

## 2018-08-09 DIAGNOSIS — M4726 Other spondylosis with radiculopathy, lumbar region: Secondary | ICD-10-CM | POA: Diagnosis not present

## 2018-08-09 DIAGNOSIS — Z981 Arthrodesis status: Secondary | ICD-10-CM | POA: Diagnosis not present

## 2018-08-09 DIAGNOSIS — M5136 Other intervertebral disc degeneration, lumbar region: Secondary | ICD-10-CM | POA: Diagnosis not present

## 2018-08-18 ENCOUNTER — Other Ambulatory Visit: Payer: Self-pay | Admitting: Neurosurgery

## 2018-08-18 DIAGNOSIS — M4726 Other spondylosis with radiculopathy, lumbar region: Secondary | ICD-10-CM

## 2018-08-20 ENCOUNTER — Ambulatory Visit
Admission: RE | Admit: 2018-08-20 | Discharge: 2018-08-20 | Disposition: A | Discharge status: Home / Self Care | Payer: PPO | Source: Ambulatory Visit | Attending: Neurosurgery | Admitting: Neurosurgery

## 2018-08-20 ENCOUNTER — Other Ambulatory Visit: Payer: Self-pay

## 2018-08-20 DIAGNOSIS — M5136 Other intervertebral disc degeneration, lumbar region: Secondary | ICD-10-CM | POA: Diagnosis not present

## 2018-08-20 DIAGNOSIS — M4726 Other spondylosis with radiculopathy, lumbar region: Secondary | ICD-10-CM

## 2018-08-20 MED ORDER — GADOBENATE DIMEGLUMINE 529 MG/ML IV SOLN
15.0000 mL | Freq: Once | INTRAVENOUS | Status: AC | PRN
  Administered 2018-08-20: 15 mL via INTRAVENOUS

## 2018-08-29 DIAGNOSIS — M4726 Other spondylosis with radiculopathy, lumbar region: Secondary | ICD-10-CM | POA: Diagnosis not present

## 2018-08-29 DIAGNOSIS — M4155 Other secondary scoliosis, thoracolumbar region: Secondary | ICD-10-CM | POA: Diagnosis not present

## 2018-08-29 DIAGNOSIS — M25561 Pain in right knee: Secondary | ICD-10-CM | POA: Diagnosis not present

## 2018-08-29 DIAGNOSIS — M48062 Spinal stenosis, lumbar region with neurogenic claudication: Secondary | ICD-10-CM | POA: Diagnosis not present

## 2018-08-29 DIAGNOSIS — M5136 Other intervertebral disc degeneration, lumbar region: Secondary | ICD-10-CM | POA: Diagnosis not present

## 2018-09-01 DIAGNOSIS — L820 Inflamed seborrheic keratosis: Secondary | ICD-10-CM | POA: Diagnosis not present

## 2018-09-01 DIAGNOSIS — D225 Melanocytic nevi of trunk: Secondary | ICD-10-CM | POA: Diagnosis not present

## 2018-09-01 DIAGNOSIS — L821 Other seborrheic keratosis: Secondary | ICD-10-CM | POA: Diagnosis not present

## 2018-09-16 ENCOUNTER — Other Ambulatory Visit: Payer: PPO

## 2018-10-10 DIAGNOSIS — M48061 Spinal stenosis, lumbar region without neurogenic claudication: Secondary | ICD-10-CM | POA: Diagnosis not present

## 2018-10-10 DIAGNOSIS — M4726 Other spondylosis with radiculopathy, lumbar region: Secondary | ICD-10-CM | POA: Diagnosis not present

## 2018-10-10 DIAGNOSIS — M5136 Other intervertebral disc degeneration, lumbar region: Secondary | ICD-10-CM | POA: Diagnosis not present

## 2018-10-11 DIAGNOSIS — M25512 Pain in left shoulder: Secondary | ICD-10-CM | POA: Diagnosis not present

## 2018-10-11 DIAGNOSIS — M545 Low back pain: Secondary | ICD-10-CM | POA: Diagnosis not present

## 2018-10-11 DIAGNOSIS — K5901 Slow transit constipation: Secondary | ICD-10-CM | POA: Diagnosis not present

## 2018-10-11 DIAGNOSIS — G8929 Other chronic pain: Secondary | ICD-10-CM | POA: Diagnosis not present

## 2018-10-11 DIAGNOSIS — I10 Essential (primary) hypertension: Secondary | ICD-10-CM | POA: Diagnosis not present

## 2018-11-03 DIAGNOSIS — B354 Tinea corporis: Secondary | ICD-10-CM | POA: Diagnosis not present

## 2018-11-03 DIAGNOSIS — L820 Inflamed seborrheic keratosis: Secondary | ICD-10-CM | POA: Diagnosis not present

## 2018-11-03 DIAGNOSIS — L814 Other melanin hyperpigmentation: Secondary | ICD-10-CM | POA: Diagnosis not present

## 2018-11-03 DIAGNOSIS — D225 Melanocytic nevi of trunk: Secondary | ICD-10-CM | POA: Diagnosis not present

## 2018-11-03 DIAGNOSIS — L821 Other seborrheic keratosis: Secondary | ICD-10-CM | POA: Diagnosis not present

## 2018-12-14 DIAGNOSIS — G4700 Insomnia, unspecified: Secondary | ICD-10-CM | POA: Diagnosis not present

## 2018-12-14 DIAGNOSIS — I10 Essential (primary) hypertension: Secondary | ICD-10-CM | POA: Diagnosis not present

## 2018-12-16 ENCOUNTER — Other Ambulatory Visit: Payer: Self-pay

## 2018-12-16 DIAGNOSIS — R6889 Other general symptoms and signs: Secondary | ICD-10-CM | POA: Diagnosis not present

## 2018-12-16 DIAGNOSIS — Z20822 Contact with and (suspected) exposure to covid-19: Secondary | ICD-10-CM

## 2018-12-17 LAB — NOVEL CORONAVIRUS, NAA: SARS-CoV-2, NAA: NOT DETECTED

## 2018-12-27 DIAGNOSIS — Z981 Arthrodesis status: Secondary | ICD-10-CM | POA: Diagnosis not present

## 2018-12-27 DIAGNOSIS — M48062 Spinal stenosis, lumbar region with neurogenic claudication: Secondary | ICD-10-CM | POA: Diagnosis not present

## 2018-12-27 DIAGNOSIS — M4316 Spondylolisthesis, lumbar region: Secondary | ICD-10-CM | POA: Diagnosis not present

## 2018-12-27 DIAGNOSIS — M5416 Radiculopathy, lumbar region: Secondary | ICD-10-CM | POA: Diagnosis not present

## 2019-01-05 DIAGNOSIS — Z23 Encounter for immunization: Secondary | ICD-10-CM | POA: Diagnosis not present

## 2019-01-19 DIAGNOSIS — M48061 Spinal stenosis, lumbar region without neurogenic claudication: Secondary | ICD-10-CM | POA: Diagnosis not present

## 2019-01-19 DIAGNOSIS — M4726 Other spondylosis with radiculopathy, lumbar region: Secondary | ICD-10-CM | POA: Diagnosis not present

## 2019-01-19 DIAGNOSIS — M5116 Intervertebral disc disorders with radiculopathy, lumbar region: Secondary | ICD-10-CM | POA: Diagnosis not present

## 2019-02-08 ENCOUNTER — Other Ambulatory Visit: Payer: Self-pay

## 2019-02-08 DIAGNOSIS — Z20822 Contact with and (suspected) exposure to covid-19: Secondary | ICD-10-CM

## 2019-02-10 LAB — NOVEL CORONAVIRUS, NAA: SARS-CoV-2, NAA: NOT DETECTED

## 2019-02-15 DIAGNOSIS — I10 Essential (primary) hypertension: Secondary | ICD-10-CM | POA: Diagnosis not present

## 2019-02-15 DIAGNOSIS — F909 Attention-deficit hyperactivity disorder, unspecified type: Secondary | ICD-10-CM | POA: Diagnosis not present

## 2019-02-15 DIAGNOSIS — R0600 Dyspnea, unspecified: Secondary | ICD-10-CM | POA: Diagnosis not present

## 2019-02-15 DIAGNOSIS — Z79899 Other long term (current) drug therapy: Secondary | ICD-10-CM | POA: Diagnosis not present

## 2019-03-27 DIAGNOSIS — Z03818 Encounter for observation for suspected exposure to other biological agents ruled out: Secondary | ICD-10-CM | POA: Diagnosis not present

## 2019-04-06 DIAGNOSIS — M4726 Other spondylosis with radiculopathy, lumbar region: Secondary | ICD-10-CM | POA: Diagnosis not present

## 2019-04-06 DIAGNOSIS — M48061 Spinal stenosis, lumbar region without neurogenic claudication: Secondary | ICD-10-CM | POA: Diagnosis not present

## 2019-04-06 DIAGNOSIS — M5136 Other intervertebral disc degeneration, lumbar region: Secondary | ICD-10-CM | POA: Diagnosis not present

## 2019-04-16 ENCOUNTER — Ambulatory Visit: Payer: PPO | Attending: Internal Medicine

## 2019-04-16 DIAGNOSIS — Z23 Encounter for immunization: Secondary | ICD-10-CM | POA: Insufficient documentation

## 2019-04-16 NOTE — Progress Notes (Signed)
   Covid-19 Vaccination Clinic  Name:  Ruben Barrera    MRN: [id number] DOB: [birth year]  04/16/2019  Ruben Barrera was observed post Covid-19 immunization for 15 minutes without incidence. He was provided with Vaccine Information Sheet and instruction to access the V-Safe system.   Ruben Barrera was instructed to call 911 with any severe reactions post vaccine: Marland Kitchen Difficulty breathing  . Swelling of your face and throat  . A fast heartbeat  . A bad rash all over your body  . Dizziness and weakness    Immunizations Administered    Name Date Dose VIS Date Route   Pfizer COVID-19 Vaccine 04/16/2019 10:57 AM 0.3 mL 03/03/2019 Intramuscular   Manufacturer: [place]   Lot: [id number]   [place]: [id number]

## 2019-05-08 ENCOUNTER — Ambulatory Visit: Payer: PPO | Attending: Internal Medicine

## 2019-05-08 DIAGNOSIS — Z23 Encounter for immunization: Secondary | ICD-10-CM | POA: Insufficient documentation

## 2019-05-08 NOTE — Progress Notes (Addendum)
   Covid-19 Vaccination Clinic  Name:  Ruben Barrera    MRN: [id number] DOB: 13-Oct-1932  05/08/2019  [name] was observed post Covid-19 immunization for 15 minutes.  He was provided with Vaccine Information Sheet and instruction to access the V-Safe system.   Per [name], RN, patient complained of being dizzy and legs felt rubbery, with a blood pressure of 121/77 and heart rate of 99. A bottle of water was provided to the patient. Episode resolved and patient verbalized feeling ok to leave.    [name] was instructed to call 911 with any severe reactions post vaccine: Marland Kitchen Difficulty breathing  . Swelling of your face and throat  . A fast heartbeat  . A bad rash all over your body  . Dizziness and weakness    Immunizations Administered    Name Date Dose VIS Date Route   Pfizer COVID-19 Vaccine 05/08/2019  8:35 AM 0.3 mL 03/03/2019 Intramuscular   Manufacturer: [place]   Lot: [id number]   [place]: [id number]

## 2019-05-12 DIAGNOSIS — M4726 Other spondylosis with radiculopathy, lumbar region: Secondary | ICD-10-CM | POA: Diagnosis not present

## 2019-05-12 DIAGNOSIS — M5136 Other intervertebral disc degeneration, lumbar region: Secondary | ICD-10-CM | POA: Diagnosis not present

## 2019-05-12 DIAGNOSIS — M4155 Other secondary scoliosis, thoracolumbar region: Secondary | ICD-10-CM | POA: Diagnosis not present

## 2019-05-12 DIAGNOSIS — M48062 Spinal stenosis, lumbar region with neurogenic claudication: Secondary | ICD-10-CM | POA: Diagnosis not present

## 2019-05-12 DIAGNOSIS — I951 Orthostatic hypotension: Secondary | ICD-10-CM | POA: Diagnosis not present

## 2019-05-12 DIAGNOSIS — I10 Essential (primary) hypertension: Secondary | ICD-10-CM | POA: Diagnosis not present

## 2019-05-18 DIAGNOSIS — Z96653 Presence of artificial knee joint, bilateral: Secondary | ICD-10-CM | POA: Diagnosis not present

## 2019-05-18 DIAGNOSIS — Z96651 Presence of right artificial knee joint: Secondary | ICD-10-CM | POA: Diagnosis not present

## 2019-05-18 DIAGNOSIS — Z471 Aftercare following joint replacement surgery: Secondary | ICD-10-CM | POA: Diagnosis not present

## 2019-05-18 DIAGNOSIS — Z96652 Presence of left artificial knee joint: Secondary | ICD-10-CM | POA: Diagnosis not present

## 2019-05-18 DIAGNOSIS — M2351 Chronic instability of knee, right knee: Secondary | ICD-10-CM | POA: Diagnosis not present

## 2019-05-22 DIAGNOSIS — M5136 Other intervertebral disc degeneration, lumbar region: Secondary | ICD-10-CM | POA: Diagnosis not present

## 2019-05-22 DIAGNOSIS — M4726 Other spondylosis with radiculopathy, lumbar region: Secondary | ICD-10-CM | POA: Diagnosis not present

## 2019-05-22 DIAGNOSIS — M48061 Spinal stenosis, lumbar region without neurogenic claudication: Secondary | ICD-10-CM | POA: Diagnosis not present

## 2019-05-24 ENCOUNTER — Encounter (HOSPITAL_COMMUNITY): Payer: Self-pay | Admitting: Emergency Medicine

## 2019-05-24 ENCOUNTER — Emergency Department (HOSPITAL_COMMUNITY): Payer: PPO

## 2019-05-24 ENCOUNTER — Emergency Department (HOSPITAL_COMMUNITY)
Admission: EM | Admit: 2019-05-24 | Discharge: 2019-05-24 | Disposition: A | Discharge status: Home / Self Care | Payer: PPO | Attending: Emergency Medicine | Admitting: Emergency Medicine

## 2019-05-24 ENCOUNTER — Other Ambulatory Visit: Payer: Self-pay

## 2019-05-24 DIAGNOSIS — R531 Weakness: Secondary | ICD-10-CM | POA: Diagnosis not present

## 2019-05-24 DIAGNOSIS — G2581 Restless legs syndrome: Secondary | ICD-10-CM | POA: Insufficient documentation

## 2019-05-24 DIAGNOSIS — Z7982 Long term (current) use of aspirin: Secondary | ICD-10-CM | POA: Diagnosis not present

## 2019-05-24 DIAGNOSIS — G459 Transient cerebral ischemic attack, unspecified: Secondary | ICD-10-CM | POA: Diagnosis not present

## 2019-05-24 DIAGNOSIS — R29818 Other symptoms and signs involving the nervous system: Secondary | ICD-10-CM | POA: Diagnosis not present

## 2019-05-24 DIAGNOSIS — R479 Unspecified speech disturbances: Secondary | ICD-10-CM | POA: Diagnosis present

## 2019-05-24 DIAGNOSIS — W19XXXA Unspecified fall, initial encounter: Secondary | ICD-10-CM | POA: Diagnosis not present

## 2019-05-24 DIAGNOSIS — Z79899 Other long term (current) drug therapy: Secondary | ICD-10-CM | POA: Diagnosis not present

## 2019-05-24 DIAGNOSIS — R4701 Aphasia: Secondary | ICD-10-CM | POA: Insufficient documentation

## 2019-05-24 DIAGNOSIS — I6523 Occlusion and stenosis of bilateral carotid arteries: Secondary | ICD-10-CM | POA: Diagnosis not present

## 2019-05-24 DIAGNOSIS — R4781 Slurred speech: Secondary | ICD-10-CM | POA: Diagnosis not present

## 2019-05-24 DIAGNOSIS — I491 Atrial premature depolarization: Secondary | ICD-10-CM | POA: Diagnosis not present

## 2019-05-24 DIAGNOSIS — F17290 Nicotine dependence, other tobacco product, uncomplicated: Secondary | ICD-10-CM | POA: Insufficient documentation

## 2019-05-24 DIAGNOSIS — R29810 Facial weakness: Secondary | ICD-10-CM | POA: Diagnosis not present

## 2019-05-24 DIAGNOSIS — I10 Essential (primary) hypertension: Secondary | ICD-10-CM | POA: Insufficient documentation

## 2019-05-24 DIAGNOSIS — R29898 Other symptoms and signs involving the musculoskeletal system: Secondary | ICD-10-CM | POA: Diagnosis not present

## 2019-05-24 LAB — CBC WITH DIFFERENTIAL/PLATELET
Abs Immature Granulocytes: 0.07 10*3/uL (ref 0.00–0.07)
Basophils Absolute: 0 10*3/uL (ref 0.0–0.1)
Basophils Relative: 0 %
Eosinophils Absolute: 0 10*3/uL (ref 0.0–0.5)
Eosinophils Relative: 0 %
HCT: 45 % (ref 39.0–52.0)
Hemoglobin: 15.1 g/dL (ref 13.0–17.0)
Immature Granulocytes: 1 %
Lymphocytes Relative: 10 %
Lymphs Abs: 1.2 10*3/uL (ref 0.7–4.0)
MCH: 33 pg (ref 26.0–34.0)
MCHC: 33.6 g/dL (ref 30.0–36.0)
MCV: 98.3 fL (ref 80.0–100.0)
Monocytes Absolute: 1.2 10*3/uL — ABNORMAL HIGH (ref 0.1–1.0)
Monocytes Relative: 10 %
Neutro Abs: 9.5 10*3/uL — ABNORMAL HIGH (ref 1.7–7.7)
Neutrophils Relative %: 79 %
Platelets: 316 10*3/uL (ref 150–400)
RBC: 4.58 MIL/uL (ref 4.22–5.81)
RDW: 12.9 % (ref 11.5–15.5)
WBC: 11.9 10*3/uL — ABNORMAL HIGH (ref 4.0–10.5)
nRBC: 0 % (ref 0.0–0.2)

## 2019-05-24 LAB — BASIC METABOLIC PANEL
Anion gap: 10 (ref 5–15)
BUN: 28 mg/dL — ABNORMAL HIGH (ref 8–23)
CO2: 23 mmol/L (ref 22–32)
Calcium: 8.8 mg/dL — ABNORMAL LOW (ref 8.9–10.3)
Chloride: 102 mmol/L (ref 98–111)
Creatinine, Ser: 1.13 mg/dL (ref 0.61–1.24)
GFR calc Af Amer: >60 mL/min (ref >60)
GFR calc non Af Amer: 59 mL/min — ABNORMAL LOW (ref >60)
Glucose, Bld: 95 mg/dL (ref 70–99)
Potassium: 3.9 mmol/L (ref 3.5–5.1)
Sodium: 135 mmol/L (ref 135–145)

## 2019-05-24 LAB — TROPONIN I (HIGH SENSITIVITY)
Troponin I (High Sensitivity): 13 ng/L (ref <18)
Troponin I (High Sensitivity): 13 ng/L (ref <18)

## 2019-05-24 LAB — PROTIME-INR
INR: 1 (ref 0.8–1.2)
Prothrombin Time: 13.2 seconds (ref 11.4–15.2)

## 2019-05-24 MED ORDER — IOHEXOL 350 MG/ML SOLN
75.0000 mL | Freq: Once | INTRAVENOUS | Status: AC | PRN
  Administered 2019-05-24: 20:00:00 75 mL via INTRAVENOUS

## 2019-05-24 MED ORDER — LORAZEPAM 2 MG/ML IJ SOLN
1.0000 mg | Freq: Once | INTRAMUSCULAR | Status: AC
  Administered 2019-05-24: 1 mg via INTRAVENOUS
  Filled 2019-05-24: qty 1

## 2019-05-24 NOTE — ED Triage Notes (Signed)
Per [facility] pt from home for "exertional stroke like sx" After walking 10 steps pt gets R sided weakness and slurred speech. EMS witnessed a similar episode. Pt has been having these sx on and off for past 9 days, after getting covid vaccine. Pt has seen neurologist for this problem, did not make a diagnosis.

## 2019-05-24 NOTE — ED Notes (Signed)
Pt was discharged from the ED. Pt read and understood discharge paperwork. Pt had vital signs completed. Pt conscious, breathing, and A&Ox4. No distress noted. Pt speaking in complete sentences. Pt brought out of the ED via wheelchair.  

## 2019-05-24 NOTE — Discharge Instructions (Addendum)
Follow up with your primary doctor next week and return to the ED if symptoms significantly worsen or change.  Begin taking an 81 mg aspirin daily.

## 2019-05-24 NOTE — ED Notes (Signed)
Patient transported to MRI 

## 2019-05-24 NOTE — ED Provider Notes (Signed)
[facility] Provider Note   CSN: [id number] Arrival date & time: 05/24/19  1429     History Chief Complaint  Patient presents with  . Transient Ischemic Attack    KIPTYN LAMIA is a 86 y.o. male.  Patient is an 86 year old male with past medical history of hypertension, lumbar stenosis, restless legs.  He presents today for evaluation of episodes of weakness and slurred speech that have been occurring over the past 9 days.  Patient reports a total of 5 episodes where he has been ambulating, then lost strength in both legs and speech became slurred.  These episodes last for several minutes then resolved with rest.  Patient states that his first episode occurred the day following receiving his second Covid vaccination.  He denies any headache or visual disturbances.  He denies any increased back pain or bowel or bladder complaints.  The history is provided by the patient.       Past Medical History:  Diagnosis Date  . Arthritis   . Complication of anesthesia    "had the shivers" after surgery  . Depression   . Dysrhythmia    "extra heart beat"  . GERD (gastroesophageal reflux disease)   . History of hiatal hernia   . Hypercholesteremia   . Hypertension   . Lumbar spinal stenosis   . RLS (restless legs syndrome)    takes ativan as needed    Patient Active Problem List   Diagnosis Date Noted  . HNP (herniated nucleus pulposus), lumbar 01/13/2016  . Lumbar stenosis with neurogenic claudication 08/28/2015  . DOE (dyspnea on exertion) 05/01/2014  . Essential hypertension 05/01/2014  . Left shoulder pain 05/05/2011    Past Surgical History:  Procedure Laterality Date  . BACK SURGERY    . LUMBAR LAMINECTOMY/DECOMPRESSION MICRODISCECTOMY N/A 08/28/2015   Procedure: Lumbar Four-Five decompressive lumbar laminectomy;  Surgeon: Jovita Gamma, MD;  Location: [facility];  Service: Neurosurgery;  Laterality: N/A;  . REPLACEMENT TOTAL KNEE  BILATERAL Bilateral 11/05/2003  . TOE AMPUTATION Right 2010   second toe  . TONSILLECTOMY         Family History  Problem Relation Age of Onset  . Stroke Mother   . Hypertension Unknown        family history    Social History   Tobacco Use  . Smoking status: Current Every Day Smoker    Types: Cigars  . Smokeless tobacco: Never Used  . Tobacco comment: quit 1972  Substance Use Topics  . Alcohol use: Yes    Alcohol/week: 0.0 standard drinks    Comment: occasionally beer or scotch  . Drug use: No    Comment: 1 cigar per day, pt states he does not inhale     Home Medications Prior to Admission medications   Medication Sig Start Date End Date Taking? Authorizing Provider  amLODipine (NORVASC) 2.5 MG tablet Take 2.5 mg by mouth every evening.     [provider]  aspirin EC 81 MG tablet Take 81 mg by mouth daily.    [provider]  benazepril (LOTENSIN) 20 MG tablet Take 20 mg by mouth every morning.     [provider]  celecoxib (CELEBREX) 200 MG capsule Take 200 mg by mouth daily.     [provider]  Cyanocobalamin (VITAMIN B-12 PO) Take 500 mcg by mouth daily.     [provider]  diclofenac sodium (VOLTAREN) 1 % GEL Apply 1 application topically 2 (two)  times daily as needed. General pain 07/30/15   [provider]  ezetimibe (ZETIA) 10 MG tablet Take 10 mg by mouth every evening.  08/06/15   [provider]  fluticasone (FLONASE) 50 MCG/ACT nasal spray Place 1 spray into both nostrils daily as needed for allergies.     [provider]  gabapentin (NEURONTIN) 300 MG capsule Take 300 mg by mouth 2 (two) times daily. 12/04/15   [provider]  HYDROcodone-acetaminophen (NORCO) 7.5-325 MG tablet Take 1 tablet by mouth daily as needed. For pain. 12/04/15   [provider]  HYDROcodone-acetaminophen (NORCO/VICODIN) 5-325 MG tablet Take 1-2 tablets by mouth every 4 (four) hours as needed (mild  pain). Patient not taking: Reported on 01/01/2016 08/29/15   Jovita Gamma, MD  HYDROcodone-acetaminophen (NORCO/VICODIN) 5-325 MG tablet Take 1-2 tablets by mouth every 4 (four) hours as needed (pain). 01/14/16   Jovita Gamma, MD  LORazepam (ATIVAN) 1 MG tablet Take 0.5 mg by mouth daily as needed for anxiety.    [provider]  MULTIPLE VITAMIN PO Take 1 tablet by mouth daily.     [provider]  omeprazole (PRILOSEC) 20 MG capsule Take 20 mg by mouth daily.    [provider]  tamsulosin (FLOMAX) 0.4 MG CAPS capsule Take 0.4 mg by mouth daily.     [provider]  traMADol (ULTRAM) 50 MG tablet Take 100 mg by mouth every 4 (four) hours as needed for moderate pain.     [provider]  zolpidem (AMBIEN) 10 MG tablet Take 5 mg by mouth at bedtime as needed for sleep.    [provider]    Allergies    Simvastatin and Other  Review of Systems   Review of Systems  All other systems reviewed and are negative.   Physical Exam Updated Vital Signs BP 133/82   Pulse 89   Temp 98.2 F (36.8 C) (Oral)   Resp 18   SpO2 96%   Physical Exam Vitals and nursing note reviewed.  Constitutional:      General: He is not in acute distress.    Appearance: He is well-developed. He is not diaphoretic.  HENT:     Head: Normocephalic and atraumatic.  Eyes:     Extraocular Movements: Extraocular movements intact.     Pupils: Pupils are equal, round, and reactive to light.  Cardiovascular:     Rate and Rhythm: Normal rate and regular rhythm.     Heart sounds: No murmur. No friction rub.  Pulmonary:     Effort: Pulmonary effort is normal. No respiratory distress.     Breath sounds: Normal breath sounds. No wheezing or rales.  Abdominal:     General: Bowel sounds are normal. There is no distension.     Palpations: Abdomen is soft.     Tenderness: There is no abdominal tenderness.  Musculoskeletal:        General: Normal range of  motion.     Cervical back: Normal range of motion and neck supple.  Skin:    General: Skin is warm and dry.  Neurological:     General: No focal deficit present.     Mental Status: He is alert and oriented to person, place, and time.     Cranial Nerves: No cranial nerve deficit.     Sensory: No sensory deficit.     Motor: No weakness.     Coordination: Coordination normal.     Gait: Gait normal.  ED Results / Procedures / Treatments   Labs (all labs ordered are listed, but only abnormal results are displayed) Labs Reviewed  BASIC METABOLIC PANEL  CBC WITH DIFFERENTIAL/PLATELET  PROTIME-INR  TROPONIN I (HIGH SENSITIVITY)    EKG EKG Interpretation  Date/Time:  Wednesday May 24 2019 14:45:28 EST Ventricular Rate:  93 PR Interval:    QRS Duration: 120 QT Interval:  398 QTC Calculation: 496 R Axis:   -42 Text Interpretation: Sinus rhythm Multiform ventricular premature complexes Short PR interval Right atrial enlargement Nonspecific IVCD with LAD Confirmed by Fredia Sorrow [phone]) on 05/24/2019 2:58:53 PM   Radiology No results found.  Procedures Procedures (including critical care time)  Medications Ordered in ED Medications - No data to display  ED Course  I have reviewed the triage vital signs and the nursing notes.  Pertinent labs & imaging results that were available during my care of the patient were reviewed by me and considered in my medical decision making (see chart for details).    MDM Rules/Calculators/A&P  Patient is an 86 year old male presenting with complaints of episodes of dizziness, slurred speech, and weakness in the legs that have been occurring intermittently for the past 9 days.  This seemed to start almost immediately after receiving his second dose of COVID-19 vaccine.  Patient's physical and neurologic exam is unremarkable.  His work-up shows no acute laboratory abnormalities and MRI of the brain is negative for stroke or tumor.  A  CT angiogram of the head and neck was also obtained to rule out significant stenosis of the carotids and cerebral vessels.  At this point, I am uncertain as to what has caused these episodes, however the patient appears clinically well.  He is dressed and ambulatory in his room and wanting to go home.  I feel as though this is a reasonable course of action.  Patient will be discharged with follow-up with his primary doctor.  Patient inquired about whether this was related to his recent Covid vaccine, however I doubt this to be the case.  Final Clinical Impression(s) / ED Diagnoses Final diagnoses:  None    Rx / DC Orders ED Discharge Orders    None       Veryl Speak, MD 05/24/19 2031

## 2019-05-25 DIAGNOSIS — G2581 Restless legs syndrome: Secondary | ICD-10-CM | POA: Diagnosis not present

## 2019-05-25 DIAGNOSIS — G459 Transient cerebral ischemic attack, unspecified: Secondary | ICD-10-CM | POA: Diagnosis not present

## 2019-05-31 DIAGNOSIS — F419 Anxiety disorder, unspecified: Secondary | ICD-10-CM | POA: Diagnosis not present

## 2019-05-31 DIAGNOSIS — I10 Essential (primary) hypertension: Secondary | ICD-10-CM | POA: Diagnosis not present

## 2019-05-31 DIAGNOSIS — R269 Unspecified abnormalities of gait and mobility: Secondary | ICD-10-CM | POA: Diagnosis not present

## 2019-06-05 DIAGNOSIS — M545 Low back pain: Secondary | ICD-10-CM | POA: Diagnosis not present

## 2019-06-05 DIAGNOSIS — M25561 Pain in right knee: Secondary | ICD-10-CM | POA: Diagnosis not present

## 2019-06-06 DIAGNOSIS — F419 Anxiety disorder, unspecified: Secondary | ICD-10-CM | POA: Diagnosis not present

## 2019-06-06 DIAGNOSIS — G2581 Restless legs syndrome: Secondary | ICD-10-CM | POA: Diagnosis not present

## 2019-06-06 DIAGNOSIS — E7800 Pure hypercholesterolemia, unspecified: Secondary | ICD-10-CM | POA: Diagnosis not present

## 2019-06-06 DIAGNOSIS — Z79899 Other long term (current) drug therapy: Secondary | ICD-10-CM | POA: Diagnosis not present

## 2019-06-06 DIAGNOSIS — Z0000 Encounter for general adult medical examination without abnormal findings: Secondary | ICD-10-CM | POA: Diagnosis not present

## 2019-06-06 DIAGNOSIS — I10 Essential (primary) hypertension: Secondary | ICD-10-CM | POA: Diagnosis not present

## 2019-06-12 DIAGNOSIS — M25561 Pain in right knee: Secondary | ICD-10-CM | POA: Diagnosis not present

## 2019-06-12 DIAGNOSIS — M545 Low back pain: Secondary | ICD-10-CM | POA: Diagnosis not present

## 2019-06-14 DIAGNOSIS — M47816 Spondylosis without myelopathy or radiculopathy, lumbar region: Secondary | ICD-10-CM | POA: Diagnosis not present

## 2019-06-14 DIAGNOSIS — M5416 Radiculopathy, lumbar region: Secondary | ICD-10-CM | POA: Diagnosis not present

## 2019-06-16 DIAGNOSIS — B078 Other viral warts: Secondary | ICD-10-CM | POA: Diagnosis not present

## 2019-06-16 DIAGNOSIS — B354 Tinea corporis: Secondary | ICD-10-CM | POA: Diagnosis not present

## 2019-07-11 ENCOUNTER — Encounter: Payer: Self-pay | Admitting: Neurology

## 2019-07-14 DIAGNOSIS — M5416 Radiculopathy, lumbar region: Secondary | ICD-10-CM | POA: Diagnosis not present

## 2019-07-14 DIAGNOSIS — M47816 Spondylosis without myelopathy or radiculopathy, lumbar region: Secondary | ICD-10-CM | POA: Diagnosis not present

## 2019-07-19 DIAGNOSIS — Z961 Presence of intraocular lens: Secondary | ICD-10-CM | POA: Diagnosis not present

## 2019-07-19 DIAGNOSIS — H524 Presbyopia: Secondary | ICD-10-CM | POA: Diagnosis not present

## 2019-07-19 DIAGNOSIS — H43813 Vitreous degeneration, bilateral: Secondary | ICD-10-CM | POA: Diagnosis not present

## 2019-07-19 DIAGNOSIS — H52203 Unspecified astigmatism, bilateral: Secondary | ICD-10-CM | POA: Diagnosis not present

## 2019-08-28 DIAGNOSIS — M5416 Radiculopathy, lumbar region: Secondary | ICD-10-CM | POA: Diagnosis not present

## 2019-08-30 DIAGNOSIS — R7303 Prediabetes: Secondary | ICD-10-CM | POA: Diagnosis not present

## 2019-09-18 NOTE — Progress Notes (Signed)
NEUROLOGY CONSULTATION NOTE  Ruben Barrera MRN: [id number] DOB: [birth year]  Referring provider: Lajean Manes, MD Primary care provider: Lajean Manes, MD  Reason for consult:  Lower extremity numbness  HISTORY OF PRESENT ILLNESS: Ruben Barrera is an 86 year old male with depression, HTN, HLD, lumbar spinal stenosis and RLS who presents for lower extremity numbness and paresthesias.  History supplemented by referring provider's note.   Patient has chronic low back pain and spinal stenosis status post PLIF L4-5 and has had numerous lumbar spine MRIs.  Last MRI on 08/20/2018 personally reviewed showed severe spinal canal and bi foraminal stenosis at L3-4, right foraminal stenosis at L1-2 and PLIF L4-5 without significant stenosis. Following his second COVID vaccine, he developed worsening bilateral sciatic pain and weakness.  He also developed numbness of both feet as well as burning, which is worse when standing on hard surfaces and improved when barefoot or when he is off his feet.  During that time, he exhibited dizziness and slurred speech and went to the ED on 05/24/2019 where MRI of brain was negative for acute infarct and CTA of head and neck showed no hemodynamically significant stenosis or occlusion.  It was thought to be a reaction to the vaccine.  He had an epidural injection on 05/26/2019 which was ineffective.  He was sent to pain management where he was started on hydrocodone and gabapentin.  He underwent a second injection in early June, which helped the radicular pain but continues to have numbness and burning in the feet.  He reportedly was checked for diabetes and his number was "mildly elevated".  08/20/2018 MRI LUMBAR SPINE WO:  IMPRESSION:  Distant PLIF L4-5 with sufficient patency of the canal and foramina at that level.  Adjacent segment degenerative disease at L3-4 with severe multifactorial spinal canal and neural foraminal stenosis that could cause neural compression on either  or both sides.  L1-2: Lateral recess and foraminal narrowing on the right that could possibly be symptomatic.  L2-3: Mild right lateral recess and foraminal narrowing, probably subclinical.  L5-S1: Lateral recess and foraminal narrowing right more than left but without definite neural compression.  PAST MEDICAL HISTORY: Past Medical History:  Diagnosis Date  . Arthritis   . Complication of anesthesia    "had the shivers" after surgery  . Depression   . Dysrhythmia    "extra heart beat"  . GERD (gastroesophageal reflux disease)   . History of hiatal hernia   . Hypercholesteremia   . Hypertension   . Lumbar spinal stenosis   . RLS (restless legs syndrome)    takes ativan as needed    PAST SURGICAL HISTORY: Past Surgical History:  Procedure Laterality Date  . BACK SURGERY    . LUMBAR LAMINECTOMY/DECOMPRESSION MICRODISCECTOMY N/A 08/28/2015   Procedure: Lumbar Four-Five decompressive lumbar laminectomy;  Surgeon: Jovita Gamma, MD;  Location: [facility];  Service: Neurosurgery;  Laterality: N/A;  . REPLACEMENT TOTAL KNEE BILATERAL Bilateral 11/05/2003  . TOE AMPUTATION Right 2010   second toe  . TONSILLECTOMY      MEDICATIONS: Current Outpatient Medications on File Prior to Visit  Medication Sig Dispense Refill  . amLODipine (NORVASC) 2.5 MG tablet Take 2.5 mg by mouth every evening.     . benazepril (LOTENSIN) 20 MG tablet Take 20 mg by mouth every morning.     . celecoxib (CELEBREX) 200 MG capsule Take 200 mg by mouth daily.     . Cyanocobalamin (VITAMIN B-12 PO) Take 500 mcg by  mouth daily.     Marland Kitchen ezetimibe (ZETIA) 10 MG tablet Take 10 mg by mouth every evening.     . fluticasone (FLONASE) 50 MCG/ACT nasal spray Place 1 spray into both nostrils daily as needed for allergies.     Marland Kitchen gabapentin (NEURONTIN) 300 MG capsule Take 300 mg by mouth at bedtime.     Marland Kitchen HYDROcodone-acetaminophen (NORCO/VICODIN) 5-325 MG tablet Take 1-2 tablets by mouth every 4 (four) hours as needed (mild  pain). (Patient not taking: Reported on 01/01/2016) 60 tablet 0  . HYDROcodone-acetaminophen (NORCO/VICODIN) 5-325 MG tablet Take 1-2 tablets by mouth every 4 (four) hours as needed (pain). (Patient not taking: Reported on 05/24/2019) 60 tablet 0  . LORazepam (ATIVAN) 1 MG tablet Take 0.5 mg by mouth daily.     . MULTIPLE VITAMIN PO Take 1 tablet by mouth daily.     Marland Kitchen omeprazole (PRILOSEC) 20 MG capsule Take 20 mg by mouth daily.    . tamsulosin (FLOMAX) 0.4 MG CAPS capsule Take 0.4 mg by mouth daily.     . traMADol (ULTRAM) 50 MG tablet Take 100 mg by mouth every 4 (four) hours as needed for moderate pain.      No current facility-administered medications on file prior to visit.    ALLERGIES: Allergies  Allergen Reactions  . Simvastatin Other (See Comments)    Leg pains   . Other Other (See Comments)    UNSPECIFIED REACTION  Reaction to anesthesia per patient    FAMILY HISTORY: Family History  Problem Relation Age of Onset  . Stroke Mother   . Hypertension Unknown        family history   SOCIAL HISTORY: Social History   Socioeconomic History  . Marital status: Married    Spouse name: Not on file  . Number of children: Not on file  . Years of education: Not on file  . Highest education level: Not on file  Occupational History  . Not on file  Tobacco Use  . Smoking status: Current Every Day Smoker    Types: Cigars  . Smokeless tobacco: Never Used  . Tobacco comment: quit 1972  Substance and Sexual Activity  . Alcohol use: Yes    Alcohol/week: 0.0 standard drinks    Comment: occasionally beer or scotch  . Drug use: No    Comment: 1 cigar per day, pt states he does not inhale   . Sexual activity: Not on file  Other Topics Concern  . Not on file  Social History Narrative  . Not on file   Social Determinants of Health   Financial Resource Strain:   . Difficulty of Paying Living Expenses:   Food Insecurity:   . Worried About Charity fundraiser in the Last Year:    . Arboriculturist in the Last Year:   Transportation Needs:   . Film/video editor (Medical):   Marland Kitchen Lack of Transportation (Non-Medical):   Physical Activity:   . Days of Exercise per Week:   . Minutes of Exercise per Session:   Stress:   . Feeling of Stress :   Social Connections:   . Frequency of Communication with Friends and Family:   . Frequency of Social Gatherings with Friends and Family:   . Attends Religious Services:   . Active Member of Clubs or Organizations:   . Attends Archivist Meetings:   Marland Kitchen Marital Status:   Intimate Partner Violence:   . Fear of Current or Ex-Partner:   .  Emotionally Abused:   Marland Kitchen Physically Abused:   . Sexually Abused:     PHYSICAL EXAM: Blood pressure 140/88, pulse 93, height 5\' 7"  (1.702 m), weight 191 lb 3.2 oz (86.7 kg), SpO2 96 %. General: No acute distress.  Patient appears well-groomed.   Head:  Normocephalic/atraumatic Eyes:  fundi examined but not visualized Neck: supple, no paraspinal tenderness, full range of motion Back: No paraspinal tenderness Heart: regular rate and rhythm Lungs: Clear to auscultation bilaterally. Vascular: No carotid bruits. Neurological Exam: Mental status: alert and oriented to person, place, and time, recent and remote memory intact, fund of knowledge intact, attention and concentration intact, speech fluent and not dysarthric, language intact. Cranial nerves: CN I: not tested CN II: pupils equal, round and reactive to light, visual fields intact CN III, IV, VI:  full range of motion, no nystagmus, no ptosis CN V: facial sensation intact CN VII: upper and lower face symmetric CN VIII: hearing intact CN IX, X: gag intact, uvula midline CN XI: sternocleidomastoid and trapezius muscles intact CN XII: tongue midline Bulk & Tone: normal, no fasciculations. Motor:  5/5 throughout Sensation:  Pinprick sensation reduced over dorsum of right foot, bottom of left foot and anterior and lateral  aspect of both legs up to above knees, vibration sensation reduced up to knees. Deep Tendon Reflexes:  1+ upper extremities, absent lower extremities, toes downgoing.   Finger to nose testing:  Without dysmetria.   Heel to shin:  Without dysmetria.   Gait:  Right limp, right foot externally rotated.  Romberg with sway.  IMPRESSION: 1.  Bilateral lower extremity numbness, likely a polyneuropathy.  Some may be residual from lumbar radiculopathy.  Etiology uncertain but may be due to pre-diabetes.  PLAN: 1.  Will check ANA, sed rate, B12, folate, TSH, SPEP/IFE 2.  Will check NCV-EMG lower extremities 3.  He is already being treated for neuropathic pain with gabapentin 4.  Follow up after testing.    Thank you for allowing me to take part in the care of this patient.  Metta Clines, DO  CC: Lajean Manes, MD

## 2019-09-19 ENCOUNTER — Other Ambulatory Visit: Payer: Self-pay

## 2019-09-19 ENCOUNTER — Other Ambulatory Visit (INDEPENDENT_AMBULATORY_CARE_PROVIDER_SITE_OTHER): Payer: PPO

## 2019-09-19 ENCOUNTER — Encounter: Payer: Self-pay | Admitting: Neurology

## 2019-09-19 ENCOUNTER — Ambulatory Visit: Payer: PPO | Admitting: Neurology

## 2019-09-19 VITALS — BP 140/88 | HR 93 | Ht 67.0 in | Wt 191.2 lb

## 2019-09-19 DIAGNOSIS — G629 Polyneuropathy, unspecified: Secondary | ICD-10-CM

## 2019-09-19 DIAGNOSIS — M48062 Spinal stenosis, lumbar region with neurogenic claudication: Secondary | ICD-10-CM

## 2019-09-19 NOTE — Patient Instructions (Signed)
1.  We will check ANA, sed rate, B12, folate, TSH, SPEP/IFE 2.  We will check nerve conduction study of lower extremities 3.  Further recommendations pending results.

## 2019-09-20 LAB — TSH: TSH: 0.81 u[IU]/mL (ref 0.35–4.50)

## 2019-09-20 LAB — B12 AND FOLATE PANEL
Folate: >24.8 ng/mL (ref >5.9)
Vitamin B-12: 398 pg/mL (ref 211–911)

## 2019-09-20 LAB — ANA W/REFLEX: Anti Nuclear Antibody (ANA): NEGATIVE

## 2019-09-20 LAB — SEDIMENTATION RATE: Sed Rate: 5 mm/hr (ref 0–20)

## 2019-09-21 LAB — PROTEIN ELECTROPHORESIS, SERUM
Albumin ELP: 4 g/dL (ref 3.8–4.8)
Alpha 1: 0.2 g/dL (ref 0.2–0.3)
Alpha 2: 0.5 g/dL (ref 0.5–0.9)
Beta 2: 0.2 g/dL (ref 0.2–0.5)
Beta Globulin: 0.3 g/dL — ABNORMAL LOW (ref 0.4–0.6)
Gamma Globulin: 0.9 g/dL (ref 0.8–1.7)
Total Protein: 6.2 g/dL (ref 6.1–8.1)

## 2019-09-21 LAB — IMMUNOFIXATION ELECTROPHORESIS
IgG (Immunoglobin G), Serum: 1067 mg/dL (ref 600–1540)
IgM, Serum: 38 mg/dL — ABNORMAL LOW (ref 50–300)
Immunofix Electr Int: NOT DETECTED
Immunoglobulin A: 107 mg/dL (ref 70–320)

## 2019-09-22 ENCOUNTER — Telehealth: Payer: Self-pay

## 2019-09-22 NOTE — Telephone Encounter (Signed)
Left VM on home phone that all labs WNL, requested call back if any questions.

## 2019-09-26 DIAGNOSIS — M5416 Radiculopathy, lumbar region: Secondary | ICD-10-CM | POA: Diagnosis not present

## 2019-09-26 DIAGNOSIS — M47816 Spondylosis without myelopathy or radiculopathy, lumbar region: Secondary | ICD-10-CM | POA: Diagnosis not present

## 2019-10-11 DIAGNOSIS — G629 Polyneuropathy, unspecified: Secondary | ICD-10-CM | POA: Diagnosis not present

## 2019-10-11 DIAGNOSIS — R269 Unspecified abnormalities of gait and mobility: Secondary | ICD-10-CM | POA: Diagnosis not present

## 2019-10-11 DIAGNOSIS — I10 Essential (primary) hypertension: Secondary | ICD-10-CM | POA: Diagnosis not present

## 2019-10-11 DIAGNOSIS — R05 Cough: Secondary | ICD-10-CM | POA: Diagnosis not present

## 2019-11-14 ENCOUNTER — Other Ambulatory Visit: Payer: Self-pay

## 2019-11-14 ENCOUNTER — Ambulatory Visit (INDEPENDENT_AMBULATORY_CARE_PROVIDER_SITE_OTHER): Payer: PPO | Admitting: Neurology

## 2019-11-14 DIAGNOSIS — M5417 Radiculopathy, lumbosacral region: Secondary | ICD-10-CM

## 2019-11-14 DIAGNOSIS — G629 Polyneuropathy, unspecified: Secondary | ICD-10-CM | POA: Diagnosis not present

## 2019-11-14 NOTE — Procedures (Signed)
[facility]  [address]  [place], [place] [address] Tel: [phone] Fax:  [phone] Test Date:  11/14/2019  Patient: Ruben Barrera DOB: 26-Jun-1932 Physician: Narda Amber, DO  Sex: Male Height: 5\' 6"  Ref Phys: Metta Clines, D.O.  ID#: [id number] Temp: 32.0C Technician:    Patient Complaints: This is an 86 year old man with history of PLIF at L4-L5 referred for evaluation of bilateral feet paresthesias.  NCV & EMG Findings: Extensive electrodiagnostic testing of the right lower extremity and additional studies of the left shows:  1. Bilateral sural and superficial peroneal sensory responses are within normal limits. 2. Bilateral peroneal (EDB) and tibial motor responses showed reduced amplitude.  Bilateral tibial motor responses at the tibialis anterior are within normal limits. 3. Bilateral tibial H reflex studies are within normal limits. 4. Chronic motor axonal loss changes are seen affecting nearly all the tested muscles in the lower extremities involving the L3-S1 myotomes on the right and L5-S1 myotomes on the left.  There is no evidence of accompanied active denervation.   Impression: 1. Chronic L5-S1 radiculopathy affecting bilateral lower extremities, moderate. 2. Chronic L3-4 radiculopathy affecting the right lower extremity, moderate. 3. There is no evidence of a large fiber sensorimotor polyneuropathy affecting the lower extremities.   ___________________________ Narda Amber, DO    Nerve Conduction Studies Anti Sensory Summary Table   Stim Site NR Peak (ms) Norm Peak (ms) P-T Amp (V) Norm P-T Amp  Left Sup Peroneal Anti Sensory (Ant Lat Mall)  32C  12 cm    2.7 <4.6 4.8 >3  Right Sup Peroneal Anti Sensory (Ant Lat Mall)  32C  12 cm    2.2 <4.6 4.3 >3  Left Sural Anti Sensory (Lat Mall)  32C  Calf    3.1 <4.6 8.9 >3  Right Sural Anti Sensory (Lat Mall)  32C  Calf    2.8 <4.6 8.6 >3   Motor Summary Table   Stim Site NR Onset  (ms) Norm Onset (ms) O-P Amp (mV) Norm O-P Amp Site1 Site2 Delta-0 (ms) Dist (cm) Vel (m/s) Norm Vel (m/s)  Left Peroneal Motor (Ext Dig Brev)  32C  Ankle    4.5 <6.0 0.9 >2.5 B Fib Ankle 9.7 40.0 41 >40  B Fib    14.2  0.7  Poplt B Fib 1.8 9.0 50 >40  Poplt    16.0  0.8         Right Peroneal Motor (Ext Dig Brev)  32C  Ankle    3.8 <6.0 1.4 >2.5 B Fib Ankle 9.6 39.0 41 >40  B Fib    13.4  1.1  Poplt B Fib 1.8 9.0 50 >40  Poplt    15.2  1.1         Left Peroneal TA Motor (Tib Ant)  32C  Fib Head    2.5 <4.5 3.2 >3 Poplit Fib Head 1.5 9.0 60 >40  Poplit    4.0  3.3         Right Peroneal TA Motor (Tib Ant)  32C  Fib Head    2.7 <4.5 5.1 >3 Poplit Fib Head 1.4 9.0 64 >40  Poplit    4.1  5.0         Left Tibial Motor (Abd Hall Brev)  32C  Ankle    3.5 <6.0 3.4 >4 Knee Ankle 10.6 42.0 40 >40  Knee    14.1  3.0         Right Tibial Motor (  Abd Hall Brev)  32C  Ankle    3.3 <6.0 3.0 >4 Knee Ankle 10.0 43.0 43 >40  Knee    13.3  2.6          H Reflex Studies   NR H-Lat (ms) Lat Norm (ms) L-R H-Lat (ms)  Left Tibial (Gastroc)  32C     34.15 <35 0.00  Right Tibial (Gastroc)  32C     34.15 <35 0.00   EMG   Side Muscle Ins Act Fibs Psw Fasc Number Recrt Dur Dur. Amp Amp. Poly Poly. Comment  Right AntTibialis Nml Nml Nml Nml 1- Rapid Many 1+ Many 1+ Some 1+ N/A  Right Gastroc Nml Nml Nml Nml 1- Rapid Some 1+ Some 1+ Nml Nml N/A  Right Flex Dig Long Nml Nml Nml Nml 1- Rapid Many 1+ Many 1+ Some 1+ N/A  Right RectFemoris Nml Nml Nml Nml 1- Rapid Some 1+ Some 1+ Nml Nml N/A  Right GluteusMed Nml Nml Nml Nml 1- Rapid Some 1+ Some 1+ Nml Nml N/A  Right AdductorLong Nml Nml Nml Nml 1- Rapid Some 1+ Some 1+ Nml Nml N/A  Right BicepsFemS Nml Nml Nml Nml 1- Rapid Some 1+ Some 1+ Nml Nml N/A  Left AntTibialis Nml Nml Nml Nml 1- Rapid Some 1+ Some 1+ Some 1+ N/A  Left Gastroc Nml Nml Nml Nml 1- Rapid Some 1+ Some 1+ Nml Nml N/A  Left Flex Dig Long Nml Nml Nml Nml 1- Rapid Some 1+ Some 1+  Nml Nml N/A  Left RectFemoris Nml Nml Nml Nml Nml Nml Nml Nml Nml Nml Nml Nml N/A  Left GluteusMed Nml Nml Nml Nml 1- Rapid Some 1+ Some 1+ Nml Nml N/A      Waveforms:

## 2019-11-17 DIAGNOSIS — L821 Other seborrheic keratosis: Secondary | ICD-10-CM | POA: Diagnosis not present

## 2019-11-17 DIAGNOSIS — B078 Other viral warts: Secondary | ICD-10-CM | POA: Diagnosis not present

## 2019-12-04 ENCOUNTER — Telehealth: Payer: Self-pay | Admitting: Neurology

## 2019-12-04 DIAGNOSIS — M5416 Radiculopathy, lumbar region: Secondary | ICD-10-CM | POA: Diagnosis not present

## 2019-12-04 NOTE — Telephone Encounter (Signed)
Patient called requesting his recent EMG results. He said he'd like to know what the next step is his treatment for neuropathy.

## 2019-12-04 NOTE — Telephone Encounter (Signed)
Nerve study shows evidence of pinched nerves in back, which is already suspected. No evidence of polyneuropathy in the feet. Workup for neuropathy is unremarkable. At this time, treatment would be symptomatic management, as with gabapentin.  LMOVM with the results.

## 2019-12-20 ENCOUNTER — Telehealth: Payer: Self-pay

## 2019-12-20 DIAGNOSIS — G629 Polyneuropathy, unspecified: Secondary | ICD-10-CM

## 2019-12-20 NOTE — Telephone Encounter (Signed)
Pt advised of 9/13 visit.   Nerve study shows evidence of pinched nerves in back, which is already suspected. No evidence of polyneuropathy in the feet. Workup for neuropathy is unremarkable. At this time, treatment would be symptomatic management, as with gabapentin.    Pt wants to know if he could get a referral for PT, If Dr. Tomi Likens feels that would help.   He wants to go back to [facility]

## 2019-12-20 NOTE — Telephone Encounter (Signed)
Yes, we can send referral

## 2019-12-20 NOTE — Telephone Encounter (Signed)
Referral faxed over

## 2020-01-02 ENCOUNTER — Ambulatory Visit: Payer: PPO

## 2020-01-03 DIAGNOSIS — M5416 Radiculopathy, lumbar region: Secondary | ICD-10-CM | POA: Diagnosis not present

## 2020-01-03 DIAGNOSIS — M47816 Spondylosis without myelopathy or radiculopathy, lumbar region: Secondary | ICD-10-CM | POA: Diagnosis not present

## 2020-01-10 DIAGNOSIS — I10 Essential (primary) hypertension: Secondary | ICD-10-CM | POA: Diagnosis not present

## 2020-01-10 DIAGNOSIS — Z23 Encounter for immunization: Secondary | ICD-10-CM | POA: Diagnosis not present

## 2020-01-10 DIAGNOSIS — M5431 Sciatica, right side: Secondary | ICD-10-CM | POA: Diagnosis not present

## 2020-01-10 DIAGNOSIS — R011 Cardiac murmur, unspecified: Secondary | ICD-10-CM | POA: Diagnosis not present

## 2020-01-10 DIAGNOSIS — M5432 Sciatica, left side: Secondary | ICD-10-CM | POA: Diagnosis not present

## 2020-01-19 DIAGNOSIS — R011 Cardiac murmur, unspecified: Secondary | ICD-10-CM | POA: Diagnosis not present

## 2020-01-22 DIAGNOSIS — I639 Cerebral infarction, unspecified: Secondary | ICD-10-CM

## 2020-01-22 HISTORY — DX: Cerebral infarction, unspecified: I63.9

## 2020-02-05 DIAGNOSIS — M47816 Spondylosis without myelopathy or radiculopathy, lumbar region: Secondary | ICD-10-CM | POA: Diagnosis not present

## 2020-02-05 DIAGNOSIS — M5416 Radiculopathy, lumbar region: Secondary | ICD-10-CM | POA: Diagnosis not present

## 2020-02-08 DIAGNOSIS — I10 Essential (primary) hypertension: Secondary | ICD-10-CM | POA: Diagnosis not present

## 2020-02-08 DIAGNOSIS — G894 Chronic pain syndrome: Secondary | ICD-10-CM | POA: Diagnosis not present

## 2020-02-08 DIAGNOSIS — Z01818 Encounter for other preprocedural examination: Secondary | ICD-10-CM | POA: Diagnosis not present

## 2020-02-14 DIAGNOSIS — I10 Essential (primary) hypertension: Secondary | ICD-10-CM | POA: Diagnosis not present

## 2020-02-14 DIAGNOSIS — R4701 Aphasia: Secondary | ICD-10-CM | POA: Diagnosis not present

## 2020-02-20 ENCOUNTER — Other Ambulatory Visit: Payer: Self-pay

## 2020-02-20 ENCOUNTER — Emergency Department (HOSPITAL_COMMUNITY): Payer: PPO

## 2020-02-20 ENCOUNTER — Inpatient Hospital Stay (HOSPITAL_COMMUNITY)
Admission: EM | Admit: 2020-02-20 | Discharge: 2020-02-22 | DRG: 065 | Disposition: A | Discharge status: Rehabilitation Facility | Payer: PPO | Attending: Internal Medicine | Admitting: Internal Medicine

## 2020-02-20 ENCOUNTER — Inpatient Hospital Stay (HOSPITAL_COMMUNITY): Payer: PPO

## 2020-02-20 ENCOUNTER — Encounter (HOSPITAL_COMMUNITY): Payer: Self-pay

## 2020-02-20 DIAGNOSIS — Z720 Tobacco use: Secondary | ICD-10-CM

## 2020-02-20 DIAGNOSIS — E782 Mixed hyperlipidemia: Secondary | ICD-10-CM | POA: Diagnosis not present

## 2020-02-20 DIAGNOSIS — R278 Other lack of coordination: Secondary | ICD-10-CM | POA: Diagnosis not present

## 2020-02-20 DIAGNOSIS — F17290 Nicotine dependence, other tobacco product, uncomplicated: Secondary | ICD-10-CM | POA: Diagnosis present

## 2020-02-20 DIAGNOSIS — Z981 Arthrodesis status: Secondary | ICD-10-CM

## 2020-02-20 DIAGNOSIS — Z888 Allergy status to other drugs, medicaments and biological substances status: Secondary | ICD-10-CM

## 2020-02-20 DIAGNOSIS — F32A Depression, unspecified: Secondary | ICD-10-CM

## 2020-02-20 DIAGNOSIS — G8191 Hemiplegia, unspecified affecting right dominant side: Secondary | ICD-10-CM | POA: Diagnosis present

## 2020-02-20 DIAGNOSIS — J90 Pleural effusion, not elsewhere classified: Secondary | ICD-10-CM | POA: Diagnosis not present

## 2020-02-20 DIAGNOSIS — I350 Nonrheumatic aortic (valve) stenosis: Secondary | ICD-10-CM | POA: Diagnosis not present

## 2020-02-20 DIAGNOSIS — Z823 Family history of stroke: Secondary | ICD-10-CM | POA: Diagnosis not present

## 2020-02-20 DIAGNOSIS — G2581 Restless legs syndrome: Secondary | ICD-10-CM | POA: Diagnosis not present

## 2020-02-20 DIAGNOSIS — R471 Dysarthria and anarthria: Secondary | ICD-10-CM | POA: Diagnosis present

## 2020-02-20 DIAGNOSIS — R4701 Aphasia: Secondary | ICD-10-CM | POA: Diagnosis not present

## 2020-02-20 DIAGNOSIS — N400 Enlarged prostate without lower urinary tract symptoms: Secondary | ICD-10-CM | POA: Diagnosis not present

## 2020-02-20 DIAGNOSIS — I6623 Occlusion and stenosis of bilateral posterior cerebral arteries: Secondary | ICD-10-CM | POA: Diagnosis not present

## 2020-02-20 DIAGNOSIS — R29702 NIHSS score 2: Secondary | ICD-10-CM | POA: Diagnosis not present

## 2020-02-20 DIAGNOSIS — I6381 Other cerebral infarction due to occlusion or stenosis of small artery: Secondary | ICD-10-CM | POA: Diagnosis not present

## 2020-02-20 DIAGNOSIS — I63529 Cerebral infarction due to unspecified occlusion or stenosis of unspecified anterior cerebral artery: Secondary | ICD-10-CM | POA: Diagnosis not present

## 2020-02-20 DIAGNOSIS — I639 Cerebral infarction, unspecified: Secondary | ICD-10-CM

## 2020-02-20 DIAGNOSIS — Z20822 Contact with and (suspected) exposure to covid-19: Secondary | ICD-10-CM | POA: Diagnosis not present

## 2020-02-20 DIAGNOSIS — R451 Restlessness and agitation: Secondary | ICD-10-CM | POA: Diagnosis present

## 2020-02-20 DIAGNOSIS — I5022 Chronic systolic (congestive) heart failure: Secondary | ICD-10-CM | POA: Diagnosis not present

## 2020-02-20 DIAGNOSIS — I110 Hypertensive heart disease with heart failure: Secondary | ICD-10-CM | POA: Diagnosis not present

## 2020-02-20 DIAGNOSIS — Z79899 Other long term (current) drug therapy: Secondary | ICD-10-CM

## 2020-02-20 DIAGNOSIS — M5418 Radiculopathy, sacral and sacrococcygeal region: Secondary | ICD-10-CM | POA: Diagnosis present

## 2020-02-20 DIAGNOSIS — G9389 Other specified disorders of brain: Secondary | ICD-10-CM | POA: Diagnosis not present

## 2020-02-20 DIAGNOSIS — K219 Gastro-esophageal reflux disease without esophagitis: Secondary | ICD-10-CM | POA: Diagnosis not present

## 2020-02-20 DIAGNOSIS — R4781 Slurred speech: Secondary | ICD-10-CM | POA: Diagnosis not present

## 2020-02-20 DIAGNOSIS — G8929 Other chronic pain: Secondary | ICD-10-CM | POA: Diagnosis present

## 2020-02-20 DIAGNOSIS — G4733 Obstructive sleep apnea (adult) (pediatric): Secondary | ICD-10-CM | POA: Diagnosis present

## 2020-02-20 DIAGNOSIS — G459 Transient cerebral ischemic attack, unspecified: Secondary | ICD-10-CM

## 2020-02-20 DIAGNOSIS — R29810 Facial weakness: Secondary | ICD-10-CM | POA: Diagnosis not present

## 2020-02-20 DIAGNOSIS — Z7982 Long term (current) use of aspirin: Secondary | ICD-10-CM

## 2020-02-20 DIAGNOSIS — I420 Dilated cardiomyopathy: Secondary | ICD-10-CM | POA: Diagnosis not present

## 2020-02-20 DIAGNOSIS — E7800 Pure hypercholesterolemia, unspecified: Secondary | ICD-10-CM | POA: Diagnosis not present

## 2020-02-20 DIAGNOSIS — I69322 Dysarthria following cerebral infarction: Secondary | ICD-10-CM | POA: Diagnosis not present

## 2020-02-20 DIAGNOSIS — D751 Secondary polycythemia: Secondary | ICD-10-CM | POA: Diagnosis not present

## 2020-02-20 DIAGNOSIS — Z96653 Presence of artificial knee joint, bilateral: Secondary | ICD-10-CM | POA: Diagnosis present

## 2020-02-20 DIAGNOSIS — I6529 Occlusion and stenosis of unspecified carotid artery: Secondary | ICD-10-CM | POA: Diagnosis not present

## 2020-02-20 DIAGNOSIS — E785 Hyperlipidemia, unspecified: Secondary | ICD-10-CM | POA: Diagnosis not present

## 2020-02-20 DIAGNOSIS — Z8249 Family history of ischemic heart disease and other diseases of the circulatory system: Secondary | ICD-10-CM | POA: Diagnosis not present

## 2020-02-20 DIAGNOSIS — K5900 Constipation, unspecified: Secondary | ICD-10-CM | POA: Diagnosis not present

## 2020-02-20 DIAGNOSIS — G629 Polyneuropathy, unspecified: Secondary | ICD-10-CM | POA: Diagnosis present

## 2020-02-20 DIAGNOSIS — G939 Disorder of brain, unspecified: Secondary | ICD-10-CM | POA: Diagnosis not present

## 2020-02-20 DIAGNOSIS — I6389 Other cerebral infarction: Secondary | ICD-10-CM | POA: Diagnosis not present

## 2020-02-20 DIAGNOSIS — Z89421 Acquired absence of other right toe(s): Secondary | ICD-10-CM | POA: Diagnosis not present

## 2020-02-20 DIAGNOSIS — I63541 Cerebral infarction due to unspecified occlusion or stenosis of right cerebellar artery: Secondary | ICD-10-CM | POA: Diagnosis not present

## 2020-02-20 DIAGNOSIS — R5381 Other malaise: Secondary | ICD-10-CM | POA: Diagnosis not present

## 2020-02-20 DIAGNOSIS — I10 Essential (primary) hypertension: Secondary | ICD-10-CM | POA: Diagnosis present

## 2020-02-20 DIAGNOSIS — I429 Cardiomyopathy, unspecified: Secondary | ICD-10-CM | POA: Diagnosis not present

## 2020-02-20 DIAGNOSIS — H538 Other visual disturbances: Secondary | ICD-10-CM | POA: Diagnosis not present

## 2020-02-20 DIAGNOSIS — Z7902 Long term (current) use of antithrombotics/antiplatelets: Secondary | ICD-10-CM

## 2020-02-20 DIAGNOSIS — I6523 Occlusion and stenosis of bilateral carotid arteries: Secondary | ICD-10-CM | POA: Diagnosis not present

## 2020-02-20 DIAGNOSIS — I672 Cerebral atherosclerosis: Secondary | ICD-10-CM | POA: Diagnosis not present

## 2020-02-20 DIAGNOSIS — I69320 Aphasia following cerebral infarction: Secondary | ICD-10-CM | POA: Diagnosis not present

## 2020-02-20 DIAGNOSIS — I361 Nonrheumatic tricuspid (valve) insufficiency: Secondary | ICD-10-CM | POA: Diagnosis not present

## 2020-02-20 LAB — DIFFERENTIAL
Abs Immature Granulocytes: 0.02 10*3/uL (ref 0.00–0.07)
Basophils Absolute: 0.1 10*3/uL (ref 0.0–0.1)
Basophils Relative: 1 %
Eosinophils Absolute: 0.1 10*3/uL (ref 0.0–0.5)
Eosinophils Relative: 2 %
Immature Granulocytes: 0 %
Lymphocytes Relative: 19 %
Lymphs Abs: 1.2 10*3/uL (ref 0.7–4.0)
Monocytes Absolute: 0.8 10*3/uL (ref 0.1–1.0)
Monocytes Relative: 13 %
Neutro Abs: 4.2 10*3/uL (ref 1.7–7.7)
Neutrophils Relative %: 65 %

## 2020-02-20 LAB — I-STAT CHEM 8, ED
BUN: 23 mg/dL (ref 8–23)
Calcium, Ion: 1.18 mmol/L (ref 1.15–1.40)
Chloride: 103 mmol/L (ref 98–111)
Creatinine, Ser: 1 mg/dL (ref 0.61–1.24)
Glucose, Bld: 102 mg/dL — ABNORMAL HIGH (ref 70–99)
HCT: 48 % (ref 39.0–52.0)
Hemoglobin: 16.3 g/dL (ref 13.0–17.0)
Potassium: 3.9 mmol/L (ref 3.5–5.1)
Sodium: 141 mmol/L (ref 135–145)
TCO2: 26 mmol/L (ref 22–32)

## 2020-02-20 LAB — COMPREHENSIVE METABOLIC PANEL
ALT: 15 U/L (ref 0–44)
AST: 21 U/L (ref 15–41)
Albumin: 4.5 g/dL (ref 3.5–5.0)
Alkaline Phosphatase: 68 U/L (ref 38–126)
Anion gap: 8 (ref 5–15)
BUN: 22 mg/dL (ref 8–23)
CO2: 26 mmol/L (ref 22–32)
Calcium: 8.9 mg/dL (ref 8.9–10.3)
Chloride: 103 mmol/L (ref 98–111)
Creatinine, Ser: 0.92 mg/dL (ref 0.61–1.24)
GFR, Estimated: >60 mL/min (ref >60)
Glucose, Bld: 104 mg/dL — ABNORMAL HIGH (ref 70–99)
Potassium: 3.8 mmol/L (ref 3.5–5.1)
Sodium: 137 mmol/L (ref 135–145)
Total Bilirubin: 0.9 mg/dL (ref 0.3–1.2)
Total Protein: 7.4 g/dL (ref 6.5–8.1)

## 2020-02-20 LAB — CBC
HCT: 47.5 % (ref 39.0–52.0)
Hemoglobin: 15.9 g/dL (ref 13.0–17.0)
MCH: 31.7 pg (ref 26.0–34.0)
MCHC: 33.5 g/dL (ref 30.0–36.0)
MCV: 94.8 fL (ref 80.0–100.0)
Platelets: 247 10*3/uL (ref 150–400)
RBC: 5.01 MIL/uL (ref 4.22–5.81)
RDW: 12.7 % (ref 11.5–15.5)
WBC: 6.4 10*3/uL (ref 4.0–10.5)
nRBC: 0 % (ref 0.0–0.2)

## 2020-02-20 LAB — APTT: aPTT: 33 seconds (ref 24–36)

## 2020-02-20 LAB — RESP PANEL BY RT-PCR (FLU A&B, COVID) ARPGX2
Influenza A by PCR: NEGATIVE
Influenza B by PCR: NEGATIVE
SARS Coronavirus 2 by RT PCR: NEGATIVE

## 2020-02-20 LAB — URINALYSIS, ROUTINE W REFLEX MICROSCOPIC
Bilirubin Urine: NEGATIVE
Glucose, UA: NEGATIVE mg/dL
Hgb urine dipstick: NEGATIVE
Ketones, ur: 5 mg/dL — AB
Leukocytes,Ua: NEGATIVE
Nitrite: NEGATIVE
Protein, ur: NEGATIVE mg/dL
Specific Gravity, Urine: 1.012 (ref 1.005–1.030)
pH: 7 (ref 5.0–8.0)

## 2020-02-20 LAB — RAPID URINE DRUG SCREEN, HOSP PERFORMED
Amphetamines: NOT DETECTED
Barbiturates: NOT DETECTED
Benzodiazepines: NOT DETECTED
Cocaine: NOT DETECTED
Opiates: POSITIVE — AB
Tetrahydrocannabinol: NOT DETECTED

## 2020-02-20 LAB — PROTIME-INR
INR: 1.1 (ref 0.8–1.2)
Prothrombin Time: 13.8 seconds (ref 11.4–15.2)

## 2020-02-20 LAB — ETHANOL: Alcohol, Ethyl (B): <10 mg/dL (ref <10)

## 2020-02-20 MED ORDER — GADOBUTROL 1 MMOL/ML IV SOLN
9.0000 mL | Freq: Once | INTRAVENOUS | Status: AC | PRN
  Administered 2020-02-20: 9 mL via INTRAVENOUS

## 2020-02-20 MED ORDER — LORAZEPAM 2 MG/ML IJ SOLN
0.5000 mg | Freq: Once | INTRAMUSCULAR | Status: AC | PRN
  Administered 2020-02-20: 0.5 mg via INTRAVENOUS
  Filled 2020-02-20: qty 1

## 2020-02-20 MED ORDER — LORAZEPAM 2 MG/ML IJ SOLN
0.5000 mg | Freq: Once | INTRAMUSCULAR | Status: AC
  Administered 2020-02-20: 0.5 mg via INTRAVENOUS
  Filled 2020-02-20: qty 1

## 2020-02-20 NOTE — ED Provider Notes (Signed)
Pt care assumed at 1500.    Pt here for transient balance difficulty, diplopia over the last week, difficulty signing a check. CT scan with possible subacute infarct, MRI brain pending at time of signout.  MRI brain with possible stroke versus alternate lesion. Discussed findings of MRI with Dr. Rory Percy with neurology, recommends MRI brain with contrast.  MRI brain with contrast with no acute contrast abnormalities. Discussed findings of MRI with Dr.Bhagat - recommends medicine admission for possible CVA.    Discussed with patient and wife findings of studies recommendation for admission and they are in agreement with treatment plan. Hospitalist consulted for admission.   Quintella Reichert, MD 02/20/20 2119

## 2020-02-20 NOTE — H&P (Signed)
AMORE GRATER KYH:[id number] DOB: [birth year] DOA: 02/20/2020     PCP: Lajean Manes, MD   Outpatient Specialists:     NEurology  Dr.Jaffe   Patient arrived to ER on 02/20/20 at 1114 Referred by Attending Quintella Reichert, MD   Patient coming from: home Lives   With family    Chief Complaint:   Chief Complaint  Patient presents with  . Blurred Vision  . Aphasia    HPI: Ruben Barrera is a 86 y.o. male with medical history significant of depression, GERD,  HLD, HTN, radiculopathy    Presented with  Double vision difficulty in writing for the past 4 days, Speech have been affected more recently Patient reports some chronic problems for which she has been seen by neurology. He have had some work-up done that showed no evidence of polyneuropathy but rather lumbar sacral radiculopathy.  For which she takes Neurontin  Had some  Gait issues even back in October He has been using Neurontin  patietn states after his second shot he was flling down and had aphasia so his MD told him not to get abooster  Infectious risk factors:  Reports  fatigue    Has   been vaccinated against COVID not boosted   Initial COVID TEST  NEGATIVE  Lab Results  Component Value Date   SARSCOV2NAA Not Detected 02/08/2019   [place] Not Detected 12/16/2018   Regarding pertinent Chronic problems:     Hyperlipidemia -  on statins Zetia    HTN on NOrvasc  BPH - on Flomax,     While in ER:  MRI showed possible CVA  ER Provider Called:  Neurology   Dr. Rory Percy and Dr. Curly Shores They Recommend admit to medicine   Will see on arrival to [facility]  Hospitalist was called for admission for possible CVA  The following Work up has been ordered so far:  Orders Placed This Encounter  Procedures  . CT HEAD WO CONTRAST  . MR BRAIN WO CONTRAST  . MR BRAIN W CONTRAST  . Ethanol  . Protime-INR  . APTT  . CBC  . Differential  . Comprehensive metabolic panel  . Urine rapid drug screen (hosp  performed)  . Urinalysis, Routine w reflex microscopic  . Diet NPO time specified  . Vital signs  . Cardiac Monitoring  . Modified Stroke Scale (mNIHSS) Document mNIHSS assessment every 2 hours for a total of 12 hours  . Stroke swallow screen  . Initiate Carrier Fluid Protocol  . If O2 sat If O2 Sat < 94%, administer O2 at 2 liters/minute via nasal cannula.  . Ambulate patient  . In and Out Cath  . Consult to hospitalist  ALL PATIENTS BEING ADMITTED/HAVING PROCEDURES NEED COVID-19 SCREENING  . Pulse oximetry, continuous  . I-stat chem 8, ED  . ED EKG  . Saline lock IV   Following Medications were ordered in ER: Medications  LORazepam (ATIVAN) injection 0.5 mg (0.5 mg Intravenous Given 02/20/20 1508)  LORazepam (ATIVAN) injection 0.5 mg (0.5 mg Intravenous Given 02/20/20 1837)  gadobutrol (GADAVIST) 1 MMOL/ML injection 9 mL (9 mLs Intravenous Contrast Given 02/20/20 1845)        Consult Orders  (From admission, onward)         Start     Ordered   02/20/20 2035  Consult to hospitalist  ALL PATIENTS BEING ADMITTED/HAVING PROCEDURES NEED COVID-19 SCREENING  Once       Comments: ALL PATIENTS BEING ADMITTED/HAVING PROCEDURES NEED COVID-19 SCREENING  Provider:  (Not yet assigned)  Question Answer Comment  Place call to: [facility]   Reason for Consult Admit      02/20/20 2034          Significant initial  Findings: Abnormal Labs Reviewed  COMPREHENSIVE METABOLIC PANEL - Abnormal; Notable for the following components:      Result Value   Glucose, Bld 104 (*)    All other components within normal limits  RAPID URINE DRUG SCREEN, HOSP PERFORMED - Abnormal; Notable for the following components:   Opiates POSITIVE (*)    All other components within normal limits  URINALYSIS, ROUTINE W REFLEX MICROSCOPIC - Abnormal; Notable for the following components:   Color, Urine STRAW (*)    Ketones, ur 5 (*)    All other components within normal limits  I-STAT CHEM 8, ED -  Abnormal; Notable for the following components:   Glucose, Bld 102 (*)    All other components within normal limits  Otherwise labs showing:    Recent Labs  Lab 02/20/20 1243 02/20/20 1305  NA 137 141  K 3.8 3.9  CO2 26  --   GLUCOSE 104* 102*  BUN 22 23  CREATININE 0.92 1.00  CALCIUM 8.9  --     Cr  Stable,  Lab Results  Component Value Date   CREATININE 1.00 02/20/2020   CREATININE 0.92 02/20/2020   CREATININE 1.13 05/24/2019    Recent Labs  Lab 02/20/20 1243  AST 21  ALT 15  ALKPHOS 68  BILITOT 0.9  PROT 7.4  ALBUMIN 4.5   Lab Results  Component Value Date   CALCIUM 8.9 02/20/2020     WBC     Component Value Date/Time   WBC 6.4 02/20/2020 1243   LYMPHSABS 1.2 02/20/2020 1243   MONOABS 0.8 02/20/2020 1243   EOSABS 0.1 02/20/2020 1243   BASOSABS 0.1 02/20/2020 1243  Plt: Lab Results  Component Value Date   PLT 247 02/20/2020    HG/HCT   stable,       Component Value Date/Time   HGB 16.3 02/20/2020 1305   HCT 48.0 02/20/2020 1305   MCV 94.8 02/20/2020 1243      ECG: Ordered Personally reviewed by me showing: HR : 61 Rhythm:  NSR,     no evidence of ischemic changes QTC 454   UA  no evidence of UTI    Urine analysis:    Component Value Date/Time   COLORURINE STRAW (A) 02/20/2020 2020   APPEARANCEUR CLEAR 02/20/2020 2020   LABSPEC 1.012 02/20/2020 2020   PHURINE 7.0 02/20/2020 2020   GLUCOSEU NEGATIVE 02/20/2020 2020   HGBUR NEGATIVE 02/20/2020 2020   BILIRUBINUR NEGATIVE 02/20/2020 2020   KETONESUR 5 (A) 02/20/2020 2020   PROTEINUR NEGATIVE 02/20/2020 2020   NITRITE NEGATIVE 02/20/2020 2020   LEUKOCYTESUR NEGATIVE 02/20/2020 2020      CT HEAD chronic changes  CXR - Ordered   MRI brain - showing possible CVA   ED Triage Vitals  Enc Vitals Group     BP 02/20/20 1130 133/81     Pulse Rate 02/20/20 1130 82     Resp 02/20/20 1130 (!) 21     Temp 02/20/20 1130 97.8 F (36.6 C)     Temp Source 02/20/20 1130 Oral     SpO2  02/20/20 1130 97 %     Weight --      Height --      Head Circumference --  Peak Flow --      Pain Score 02/20/20 1147 0     Pain Loc --      Pain Edu? --      Excl. in Uncertain? --   TMAX(24)@       Latest  Blood pressure (!) 159/97, pulse 92, temperature 97.8 F (36.6 C), temperature source Oral, resp. rate 17, SpO2 94 %.     Review of Systems:    Pertinent positives include: gait abnormality, no slurred speech difficulty writing  Constitutional:  No weight loss, night sweats, Fevers, chills, fatigue, weight loss  HEENT:  No headaches, Difficulty swallowing,Tooth/dental problems,Sore throat,  No sneezing, itching, ear ache, nasal congestion, post nasal drip,  Cardio-vascular:  No chest pain, Orthopnea, PND, anasarca, dizziness, palpitations.no Bilateral lower extremity swelling  GI:  No heartburn, indigestion, abdominal pain, nausea, vomiting, diarrhea, change in bowel habits, loss of appetite, melena, blood in stool, hematemesis Resp:  no shortness of breath at rest. No dyspnea on exertion, No excess mucus, no productive cough, No non-productive cough, No coughing up of blood.No change in color of mucus.No wheezing. Skin:  no rash or lesions. No jaundice GU:  no dysuria, change in color of urine, no urgency or frequency. No straining to urinate.  No flank pain.  Musculoskeletal:  No joint pain or no joint swelling. No decreased range of motion. No back pain.  Psych:  No change in mood or affect. No depression or anxiety. No memory loss.  Neuro: no localizing neurological complaints, no tingling, no weakness, no double vision,   no confusion  All systems reviewed and apart from [place] all are negative  Past Medical History:   Past Medical History:  Diagnosis Date  . Arthritis   . Complication of anesthesia    "had the shivers" after surgery  . Depression   . Dysrhythmia    "extra heart beat"  . GERD (gastroesophageal reflux disease)   . History of hiatal hernia    . Hypercholesteremia   . Hypertension   . Lumbar spinal stenosis   . RLS (restless legs syndrome)    takes ativan as needed     Past Surgical History:  Procedure Laterality Date  . BACK SURGERY    . LUMBAR LAMINECTOMY/DECOMPRESSION MICRODISCECTOMY N/A 08/28/2015   Procedure: Lumbar Four-Five decompressive lumbar laminectomy;  Surgeon: Jovita Gamma, MD;  Location: [facility];  Service: Neurosurgery;  Laterality: N/A;  . REPLACEMENT TOTAL KNEE BILATERAL Bilateral 11/05/2003  . TOE AMPUTATION Right 2010   second toe  . TONSILLECTOMY      Social History:  Ambulatory   Independently but has a cane     reports that he has been smoking cigars. He has never used smokeless tobacco. He reports current alcohol use. He reports that he does not use drugs.   Family History:   Family History  Problem Relation Age of Onset  . Stroke Mother   . Hypertension Other        family history    Allergies: Allergies  Allergen Reactions  . Simvastatin Other (See Comments)    Leg pains   . Other Other (See Comments)    UNSPECIFIED REACTION  Reaction to anesthesia per patient     Prior to Admission medications   Medication Sig Start Date End Date Taking? Authorizing Provider  amLODipine (NORVASC) 2.5 MG tablet Take 2.5 mg by mouth every evening.    Yes [provider]  baclofen (LIORESAL) 10 MG tablet Take 10 mg by mouth  3 (three) times daily as needed for muscle spasms.  02/05/20  Yes [provider]  celecoxib (CELEBREX) 200 MG capsule Take 200 mg by mouth daily.    Yes [provider]  citalopram (CELEXA) 10 MG tablet Take 10 mg by mouth daily. 06/18/19  Yes [provider]  ezetimibe (ZETIA) 10 MG tablet Take 10 mg by mouth every evening.  08/06/15  Yes [provider]  fluticasone (FLONASE) 50 MCG/ACT nasal spray Place 1 spray into both nostrils daily as needed for allergies.    Yes [provider]  gabapentin (NEURONTIN) 400 MG  capsule Take 400 mg by mouth daily.   Yes [provider]  gabapentin (NEURONTIN) 600 MG tablet Take 600 mg by mouth 3 (three) times daily.   Yes [provider]  HYDROcodone-acetaminophen (NORCO) 7.5-325 MG tablet Take 1 tablet by mouth every 8 (eight) hours as needed for moderate pain.   Yes [provider]  LORazepam (ATIVAN) 1 MG tablet Take 1 mg by mouth daily.    Yes [provider]  MULTIPLE VITAMIN PO Take 1 tablet by mouth daily.    Yes [provider]  omeprazole (PRILOSEC) 20 MG capsule Take 20 mg by mouth daily.   Yes [provider]  tamsulosin (FLOMAX) 0.4 MG CAPS capsule Take 0.4 mg by mouth daily.    Yes [provider]  HYDROcodone-acetaminophen (NORCO/VICODIN) 5-325 MG tablet Take 1-2 tablets by mouth every 4 (four) hours as needed (mild pain). Patient not taking: Reported on 02/20/2020 08/29/15   Jovita Gamma, MD  HYDROcodone-acetaminophen (NORCO/VICODIN) 5-325 MG tablet Take 1-2 tablets by mouth every 4 (four) hours as needed (pain). Patient not taking: Reported on 02/20/2020 01/14/16   Jovita Gamma, MD   Physical Exam: Vitals with BMI 02/20/2020 02/20/2020 02/20/2020  Height - - -  Weight - - -  BMI - - -  Systolic 196 222 979  Diastolic 97 892 119  Pulse 92 42 87     1. General:  in No  Acute distress    Chronically ill -appearing 2. Psychological: Alert and Oriented 3. Head/ENT:    Dry Mucous Membranes                          Head Non traumatic, neck supple                           Poor Dentition 4. SKIN:   decreased Skin turgor,  Skin clean Dry and intact no rash 5. Heart: Regular rate and rhythm systolic Murmur, no Rub or gallop 6. Lungs: no wheezes or crackles   7. Abdomen: Soft,  non-tender, Non distended   Obese bowel sounds present 8. Lower extremities: no clubbing, cyanosis, no edema 9. Neurologically  strength 5 out of 5 in all 4 extremities cranial nerves II through XII intact 10.  MSK: Normal range of motion   All other LABS:     Recent Labs  Lab 02/20/20 1243 02/20/20 1305  WBC 6.4  --   NEUTROABS 4.2  --   HGB 15.9 16.3  HCT 47.5 48.0  MCV 94.8  --   PLT 247  --      Recent Labs  Lab 02/20/20 1243 02/20/20 1305  NA 137 141  K 3.8 3.9  CL 103 103  CO2 26  --   GLUCOSE 104* 102*  BUN 22 23  CREATININE 0.92 1.00  CALCIUM 8.9  --      Recent Labs  Lab 02/20/20 1243  AST 21  ALT 15  ALKPHOS 68  BILITOT 0.9  PROT 7.4  ALBUMIN 4.5       Cultures: No results found for: SDES, SPECREQUEST, CULT, REPTSTATUS   Radiological Exams on Admission: CT HEAD WO CONTRAST  Result Date: 02/20/2020 CLINICAL DATA:  Slurred speech, blurred vision. EXAM: CT HEAD WITHOUT CONTRAST TECHNIQUE: Contiguous axial images were obtained from the base of the skull through the vertex without intravenous contrast. COMPARISON:  May 24, 2019. FINDINGS: Brain: Mild diffuse cortical atrophy is noted. Mild chronic ischemic white matter disease is noted. No mass effect or midline shift is noted. Ventricular size is within normal limits. There is no evidence of mass lesion or hemorrhage. Low density is noted in the region of the right cerebellar peduncle concerning for infarction of indeterminate age. Vascular: No hyperdense vessel or unexpected calcification. Skull: Normal. Negative for fracture or focal lesion. Sinuses/Orbits: No acute finding. Other: None. IMPRESSION: Mild diffuse cortical atrophy. Mild chronic ischemic white matter disease. Low density is noted in the region of the right cerebellar peduncle concerning for infarction of indeterminate age. MRI is recommended for further evaluation. Electronically Signed   By: Marijo Conception M.D.   On: 02/20/2020 13:12   MR BRAIN WO CONTRAST  Result Date: 02/20/2020 CLINICAL DATA:  Right-sided weakness, slurred speech, blurry vision EXAM: MRI HEAD WITHOUT CONTRAST TECHNIQUE: Multiplanar, multiecho pulse sequences of the brain  and surrounding structures were obtained without intravenous contrast. COMPARISON:  05/24/2019 FINDINGS: Brain: There is area of mildly reduced diffusion within the right brachium pontis measuring approximately 1.4 cm. There is corresponding T2 hyperintensity with rounded appearance. Additional patchy and confluent areas of T2 hyperintensity in the supratentorial white matter are nonspecific but probably reflect moderate to advanced chronic microvascular ischemic changes. Small chronic right cerebellar infarct. There is no intracranial hemorrhage. There is no intracranial mass or significant mass effect. There is no hydrocephalus or extra-axial fluid collection. Prominence of the ventricles and sulci reflects generalized parenchymal volume loss similar to the prior study. Vascular: Major vessel flow voids at the skull base are preserved. Skull and upper cervical spine: Normal marrow signal is preserved. Sinuses/Orbits: Minor mucosal thickening. Bilateral lens replacements. Other: Sella is unremarkable.  Mastoid air cells are clear. IMPRESSION: Area of abnormal signal within the right middle cerebellar peduncle may reflect acute to subacute infarct. Given rounded appearance, follow-up is recommended ensure appropriate evolution (suggest addition of post contrast imaging at that time). Moderate to advanced chronic microvascular ischemic changes. Small chronic right cerebellar infarct. Electronically Signed   By: Macy Mis M.D.   On: 02/20/2020 15:44   MR BRAIN W CONTRAST  Result Date: 02/20/2020 CLINICAL DATA:  Right middle cerebellar peduncle lesion. EXAM: MRI HEAD WITH CONTRAST TECHNIQUE: Multiplanar, multiecho pulse sequences of the brain and surrounding structures were obtained with intravenous contrast. CONTRAST:  41mL GADAVIST GADOBUTROL 1 MMOL/ML IV SOLN COMPARISON:  Brain MRI without contrast 02/20/2020 FINDINGS: There is no abnormal contrast enhancement at the site of the right middle cerebellar  peduncle lesion. There is a punctate focus of contrast enhancement within the posterior right cerebellar hemisphere (series 5, image 43) that is favored to be vascular. IMPRESSION: 1. No abnormal contrast enhancement at the site of the right middle cerebellar peduncle lesion. 2. Punctate focus of enhancement in the posterior right cerebellar hemispheres favored to be vascular, but attention on follow-up scan in 6-12 weeks recommended. Electronically  Signed   By: Ulyses Jarred M.D.   On: 02/20/2020 19:19    Chart has been reviewed   Assessment/Plan   86 y.o. male with medical history significant of depression, GERD,  HLD, HTN, radiculopathy  Admitted for CVA  Present on Admission: . CVA (cerebral vascular accident) ([place]) -  - will admit based on TIA/CVA protocol,         Monitor on Tele       MRA/MRI Resulted - showing possible acute ischemic CVA           Carotid Doppler ordered       Echo to evaluate for possible embolic source,        obtain cardiac enzymes,  ECG,   Lipid panel, TSH.        Order PT/OT evaluation.        keep nothing by mouth until passes swallow eval      Defer to neurology if for fever patient should be started on aspirin given unclear diagnostic picture.       Allow permissive Hypertension keep BP <220/120        Neurology consulted   will see in AM  . Essential hypertension -allow permissive hypertension for tonight  . Erythrocytosis -appears to be chronic but may be contributing to potentially multiple strokes.  Would recommend hematology follow-up versus inpatient consult  . BPH (benign prostatic hyperplasia) -resume Flomax when stable  History of HLD resume home medications when able to tolerate check lipid panel  Other plan as per orders.  DVT prophylaxis:  SCD    Code Status:    Code Status: Prior FULL CODE  as per patient   I had personally discussed CODE STATUS with patient    Family Communication:   Family  at  Bedside  plan of care was discussed    With Wife,    Disposition Plan:        To home once workup is complete and patient is stable   Following barriers for discharge:                                                    Will need consultants to evaluate patient prior to discharge                       Would benefit from PT/OT eval prior to DC  Ordered                   Swallow eval - SLP ordered              Consults called: NEurology is aware,   Admission status:  ED Disposition    ED Disposition Condition [place]: [facility] [100100]  Level of Care: Telemetry Medical [104]  May admit patient to [facility] or [facility] if equivalent level of care is available:: No  Covid Evaluation: Asymptomatic Screening Protocol (No Symptoms)  Diagnosis: CVA (cerebral vascular accident) [facility]) [[id number]]  Admitting Physician: Toy Baker [[id number]]  Attending Physician: Toy Baker [[id number]]  Estimated length of stay: past midnight tomorrow  Certification:: I certify this patient will need inpatient services for at least 2 midnights          inpatient     I Expect 2 midnight  stay secondary to severity of patient's current illness need for inpatient interventions justified by the following:   Severe lab/radiological/exam abnormalities including:   Possible new CVA and extensive comorbidities including:  Chronic pain   That are currently affecting medical management.  I expect  patient to be hospitalized for 2 midnights requiring inpatient medical care.  Patient is at high risk for adverse outcome (such as loss of life or disability) if not treated.  Indication for inpatient stay as follows:  New CVA needing work-up  Need for   IV fluids,    Level of care    tele  indefinitely please discontinue once patient no longer qualifies COVID-19 Labs    Lab Results  Component Value Date   [place] NEGATIVE 02/20/2020     Precautions: admitted as Covid Negative    PPE:  Used by the provider:   P100  eye Goggles,  Gloves   Ruben Barrera 02/20/2020, 11:47 PM    [facility]     after 2 AM please page floor coverage PA If 7AM-7PM, please contact the day team taking care of the patient using [email or web address]   Patient was evaluated in the context of the global COVID-19 pandemic, which necessitated consideration that the patient might be at risk for infection with the SARS-CoV-2 virus that causes COVID-19. Institutional protocols and algorithms that pertain to the evaluation of patients at risk for COVID-19 are in a state of rapid change based on information released by regulatory bodies including the CDC and federal and state organizations. These policies and algorithms were followed during the patient's care.

## 2020-02-20 NOTE — ED Notes (Signed)
Patient transported to MRI 

## 2020-02-20 NOTE — ED Triage Notes (Signed)
Pt reports multiple intermittent episodes of blurred vision, slurred speech and confusion since Friday. Pt also reports gait issues that he began to notice in October. Wife reports pt has been having intermittent confusion as well. Pt denies weakness, pain, or loss of sensation. Equal grips and strength bilaterally. No facial droop noted at this time.

## 2020-02-20 NOTE — ED Notes (Signed)
Patient was able to ambulate without assistance for about 20 feet but needed assistance to walk without falling over after that.

## 2020-02-20 NOTE — ED Provider Notes (Signed)
[facility] DEPT Provider Note   CSN: [id number] Arrival date & time: 02/20/20  1114     History Chief Complaint  Patient presents with  . Blurred Vision  . Aphasia    Ruben Barrera is a 86 y.o. male.  HPI 86 year old male presents with trouble speaking.  This has been ongoing for over a week.  He cannot usually get the right word out.  Since then he has seen his PCP and did not have the symptoms at the time and was told to start on a baby aspirin.  Symptoms started again after that visit.  On and off dizziness as well as he had double vision for about 3 days though that has seemingly resolved.  He has been having gait problems for a while now.  Yesterday he was trying to sign a check and when he signed it he could not recognize his own handwriting.  Speech seems slightly off to him but is better. No recent medication changes besides the aspirin. No headache.    Past Medical History:  Diagnosis Date  . Arthritis   . Complication of anesthesia    "had the shivers" after surgery  . Depression   . Dysrhythmia    "extra heart beat"  . GERD (gastroesophageal reflux disease)   . History of hiatal hernia   . Hypercholesteremia   . Hypertension   . Lumbar spinal stenosis   . RLS (restless legs syndrome)    takes ativan as needed    Patient Active Problem List   Diagnosis Date Noted  . HNP (herniated nucleus pulposus), lumbar 01/13/2016  . Lumbar stenosis with neurogenic claudication 08/28/2015  . DOE (dyspnea on exertion) 05/01/2014  . Essential hypertension 05/01/2014  . Left shoulder pain 05/05/2011    Past Surgical History:  Procedure Laterality Date  . BACK SURGERY    . LUMBAR LAMINECTOMY/DECOMPRESSION MICRODISCECTOMY N/A 08/28/2015   Procedure: Lumbar Four-Five decompressive lumbar laminectomy;  Surgeon: Jovita Gamma, MD;  Location: [facility];  Service: Neurosurgery;  Laterality: N/A;  . REPLACEMENT TOTAL KNEE BILATERAL Bilateral  11/05/2003  . TOE AMPUTATION Right 2010   second toe  . TONSILLECTOMY         Family History  Problem Relation Age of Onset  . Stroke Mother   . Hypertension Other        family history    Social History   Tobacco Use  . Smoking status: Current Every Day Smoker    Types: Cigars  . Smokeless tobacco: Never Used  . Tobacco comment: quit 1972  Substance Use Topics  . Alcohol use: Yes    Alcohol/week: 0.0 standard drinks    Comment: occasionally beer or scotch  . Drug use: No    Comment: 1 cigar per day, pt states he does not inhale     Home Medications Prior to Admission medications   Medication Sig Start Date End Date Taking? Authorizing Provider  amLODipine (NORVASC) 2.5 MG tablet Take 2.5 mg by mouth every evening.    Yes [provider]  baclofen (LIORESAL) 10 MG tablet Take 10 mg by mouth 3 (three) times daily as needed for muscle spasms.  02/05/20  Yes [provider]  celecoxib (CELEBREX) 200 MG capsule Take 200 mg by mouth daily.    Yes [provider]  citalopram (CELEXA) 10 MG tablet Take 10 mg by mouth daily. 06/18/19  Yes [provider]  ezetimibe (ZETIA) 10 MG tablet Take 10 mg by mouth  every evening.  08/06/15  Yes [provider]  fluticasone (FLONASE) 50 MCG/ACT nasal spray Place 1 spray into both nostrils daily as needed for allergies.    Yes [provider]  gabapentin (NEURONTIN) 400 MG capsule Take 400 mg by mouth daily.   Yes [provider]  gabapentin (NEURONTIN) 600 MG tablet Take 600 mg by mouth 3 (three) times daily.   Yes [provider]  HYDROcodone-acetaminophen (NORCO) 7.5-325 MG tablet Take 1 tablet by mouth every 8 (eight) hours as needed for moderate pain.   Yes [provider]  LORazepam (ATIVAN) 1 MG tablet Take 1 mg by mouth daily.    Yes [provider]  MULTIPLE VITAMIN PO Take 1 tablet by mouth daily.    Yes [provider]  omeprazole  (PRILOSEC) 20 MG capsule Take 20 mg by mouth daily.   Yes [provider]  tamsulosin (FLOMAX) 0.4 MG CAPS capsule Take 0.4 mg by mouth daily.    Yes [provider]  HYDROcodone-acetaminophen (NORCO/VICODIN) 5-325 MG tablet Take 1-2 tablets by mouth every 4 (four) hours as needed (mild pain). Patient not taking: Reported on 02/20/2020 08/29/15   Jovita Gamma, MD  HYDROcodone-acetaminophen (NORCO/VICODIN) 5-325 MG tablet Take 1-2 tablets by mouth every 4 (four) hours as needed (pain). Patient not taking: Reported on 02/20/2020 01/14/16   Jovita Gamma, MD    Allergies    Simvastatin and Other  Review of Systems   Review of Systems  Constitutional: Negative for fever.  Eyes: Positive for visual disturbance.  Cardiovascular: Negative for chest pain.  Musculoskeletal: Positive for back pain (chronic).  Neurological: Positive for dizziness and speech difficulty. Negative for weakness, numbness and headaches.  All other systems reviewed and are negative.   Physical Exam Updated Vital Signs BP 136/82 (BP Location: Right Arm)   Pulse 80   Temp 97.8 F (36.6 C) (Oral)   Resp 11   SpO2 96%   Physical Exam Vitals and nursing note reviewed.  Constitutional:      General: He is not in acute distress.    Appearance: He is well-developed. He is obese. He is not ill-appearing or diaphoretic.  HENT:     Head: Normocephalic and atraumatic.     Right Ear: External ear normal.     Left Ear: External ear normal.     Nose: Nose normal.  Eyes:     General:        Right eye: No discharge.        Left eye: No discharge.     Extraocular Movements: Extraocular movements intact.     Pupils: Pupils are equal, round, and reactive to light.  Cardiovascular:     Rate and Rhythm: Normal rate and regular rhythm.     Heart sounds: Normal heart sounds.  Pulmonary:     Effort: Pulmonary effort is normal.     Breath sounds: Normal breath sounds.  Abdominal:     Palpations:  Abdomen is soft.     Tenderness: There is no abdominal tenderness.  Musculoskeletal:     Cervical back: Neck supple.  Skin:    General: Skin is warm and dry.  Neurological:     Mental Status: He is alert.     Comments: CN 3-12 grossly intact. 5/5 strength in all 4 extremities. Grossly normal sensation. Normal finger to nose.   Psychiatric:        Mood and Affect: Mood is not anxious.     ED Results /  Procedures / Treatments   Labs (all labs ordered are listed, but only abnormal results are displayed) Labs Reviewed  COMPREHENSIVE METABOLIC PANEL - Abnormal; Notable for the following components:      Result Value   Glucose, Bld 104 (*)    All other components within normal limits  I-STAT CHEM 8, ED - Abnormal; Notable for the following components:   Glucose, Bld 102 (*)    All other components within normal limits  ETHANOL  PROTIME-INR  APTT  CBC  DIFFERENTIAL  RAPID URINE DRUG SCREEN, HOSP PERFORMED  URINALYSIS, ROUTINE W REFLEX MICROSCOPIC    EKG EKG Interpretation  Date/Time:  Tuesday February 20 2020 12:31:56 EST Ventricular Rate:  61 PR Interval:    QRS Duration: 117 QT Interval:  450 QTC Calculation: 387 R Axis:   -30 Text Interpretation: Sinus rhythm Nonspecific intraventricular conduction delay Borderline T abnormalities, lateral leads Confirmed by Sherwood Gambler [id number]) on 02/20/2020 1:05:11 PM   Radiology CT HEAD WO CONTRAST  Result Date: 02/20/2020 CLINICAL DATA:  Slurred speech, blurred vision. EXAM: CT HEAD WITHOUT CONTRAST TECHNIQUE: Contiguous axial images were obtained from the base of the skull through the vertex without intravenous contrast. COMPARISON:  May 24, 2019. FINDINGS: Brain: Mild diffuse cortical atrophy is noted. Mild chronic ischemic white matter disease is noted. No mass effect or midline shift is noted. Ventricular size is within normal limits. There is no evidence of mass lesion or hemorrhage. Low density is noted in the region of  the right cerebellar peduncle concerning for infarction of indeterminate age. Vascular: No hyperdense vessel or unexpected calcification. Skull: Normal. Negative for fracture or focal lesion. Sinuses/Orbits: No acute finding. Other: None. IMPRESSION: Mild diffuse cortical atrophy. Mild chronic ischemic white matter disease. Low density is noted in the region of the right cerebellar peduncle concerning for infarction of indeterminate age. MRI is recommended for further evaluation. Electronically Signed   By: Marijo Conception M.D.   On: 02/20/2020 13:12    Procedures Procedures (including critical care time)  Medications Ordered in ED Medications  LORazepam (ATIVAN) injection 0.5 mg (0.5 mg Intravenous Given 02/20/20 1508)    ED Course  I have reviewed the triage vital signs and the nursing notes.  Pertinent labs & imaging results that were available during my care of the patient were reviewed by me and considered in my medical decision making (see chart for details).    MDM Rules/Calculators/A&P                          Patient CT head shows possible right cerebellar infarct.  This would seem to correlate with his symptoms.  We will get MRI.  His labs have been reviewed and are benign. Care to Dr. Ralene Bathe.  Final Clinical Impression(s) / ED Diagnoses Final diagnoses:  None    Rx / DC Orders ED Discharge Orders    None       Sherwood Gambler, MD 02/20/20 [id number]

## 2020-02-21 ENCOUNTER — Inpatient Hospital Stay (HOSPITAL_COMMUNITY): Payer: PPO

## 2020-02-21 DIAGNOSIS — I361 Nonrheumatic tricuspid (valve) insufficiency: Secondary | ICD-10-CM | POA: Diagnosis not present

## 2020-02-21 DIAGNOSIS — F32A Depression, unspecified: Secondary | ICD-10-CM

## 2020-02-21 DIAGNOSIS — I350 Nonrheumatic aortic (valve) stenosis: Secondary | ICD-10-CM | POA: Diagnosis not present

## 2020-02-21 DIAGNOSIS — Z720 Tobacco use: Secondary | ICD-10-CM

## 2020-02-21 DIAGNOSIS — I6389 Other cerebral infarction: Secondary | ICD-10-CM | POA: Diagnosis not present

## 2020-02-21 DIAGNOSIS — I6302 Cerebral infarction due to thrombosis of basilar artery: Secondary | ICD-10-CM

## 2020-02-21 DIAGNOSIS — E782 Mixed hyperlipidemia: Secondary | ICD-10-CM

## 2020-02-21 DIAGNOSIS — I639 Cerebral infarction, unspecified: Secondary | ICD-10-CM

## 2020-02-21 LAB — LIPID PANEL
Cholesterol: 204 mg/dL — ABNORMAL HIGH (ref 0–200)
HDL: 42 mg/dL (ref >40)
LDL Cholesterol: 122 mg/dL — ABNORMAL HIGH (ref 0–99)
Total CHOL/HDL Ratio: 4.9 RATIO
Triglycerides: 199 mg/dL — ABNORMAL HIGH (ref <150)
VLDL: 40 mg/dL (ref 0–40)

## 2020-02-21 LAB — ECHOCARDIOGRAM COMPLETE
AR max vel: 1.22 cm2
AV Area VTI: 1.32 cm2
AV Area mean vel: 1.08 cm2
AV Mean grad: 25 mmHg
AV Peak grad: 39.1 mmHg
Ao pk vel: 3.13 m/s
Area-P 1/2: 4.97 cm2
Calc EF: 34 %
S' Lateral: 5.3 cm
Single Plane A2C EF: 32.7 %
Single Plane A4C EF: 36.4 %

## 2020-02-21 LAB — HEMOGLOBIN A1C
Hgb A1c MFr Bld: 5.6 % (ref 4.8–5.6)
Mean Plasma Glucose: 114.02 mg/dL

## 2020-02-21 MED ORDER — GABAPENTIN 400 MG PO CAPS: 400.0000 mg | ORAL_CAPSULE | Freq: Every day | ORAL | Status: DC

## 2020-02-21 MED ORDER — ACETAMINOPHEN 160 MG/5ML PO SOLN: 650.0000 mg | ORAL | Status: DC | PRN

## 2020-02-21 MED ORDER — CLOPIDOGREL BISULFATE 75 MG PO TABS: 75.0000 mg | ORAL_TABLET | Freq: Every day | ORAL | 11 refills | Status: DC

## 2020-02-21 MED ORDER — GABAPENTIN 600 MG PO TABS
600.0000 mg | ORAL_TABLET | Freq: Once | ORAL | Status: AC
  Administered 2020-02-21: 600 mg via ORAL
  Filled 2020-02-21: qty 1

## 2020-02-21 MED ORDER — GABAPENTIN 600 MG PO TABS
600.0000 mg | ORAL_TABLET | Freq: Three times a day (TID) | ORAL | Status: DC
  Administered 2020-02-21 – 2020-02-22 (×3): 600 mg via ORAL
  Filled 2020-02-21 (×3): qty 1

## 2020-02-21 MED ORDER — EZETIMIBE 10 MG PO TABS
10.0000 mg | ORAL_TABLET | Freq: Every evening | ORAL | Status: DC
  Administered 2020-02-21: 10 mg via ORAL
  Filled 2020-02-21: qty 1

## 2020-02-21 MED ORDER — TAMSULOSIN HCL 0.4 MG PO CAPS
0.4000 mg | ORAL_CAPSULE | Freq: Every day | ORAL | Status: DC
  Administered 2020-02-21 – 2020-02-22 (×2): 0.4 mg via ORAL
  Filled 2020-02-21 (×2): qty 1

## 2020-02-21 MED ORDER — GABAPENTIN 600 MG PO TABS: 600.0000 mg | ORAL_TABLET | Freq: Three times a day (TID) | ORAL | Status: DC

## 2020-02-21 MED ORDER — LORAZEPAM 1 MG PO TABS
1.0000 mg | ORAL_TABLET | Freq: Three times a day (TID) | ORAL | Status: DC | PRN
  Administered 2020-02-21: 1 mg via ORAL
  Filled 2020-02-21: qty 1

## 2020-02-21 MED ORDER — ASPIRIN 81 MG PO TBEC
81.0000 mg | DELAYED_RELEASE_TABLET | Freq: Every day | ORAL | 11 refills | Status: DC
Start: 2020-02-22 — End: 2021-03-01

## 2020-02-21 MED ORDER — CLOPIDOGREL BISULFATE 75 MG PO TABS
300.0000 mg | ORAL_TABLET | Freq: Once | ORAL | Status: AC
  Administered 2020-02-21: 300 mg via ORAL
  Filled 2020-02-21: qty 4

## 2020-02-21 MED ORDER — ASPIRIN EC 81 MG PO TBEC
81.0000 mg | DELAYED_RELEASE_TABLET | Freq: Every day | ORAL | Status: DC
  Administered 2020-02-22: 81 mg via ORAL
  Filled 2020-02-21: qty 1

## 2020-02-21 MED ORDER — ACETAMINOPHEN 325 MG PO TABS: 650.0000 mg | ORAL_TABLET | ORAL | Status: DC | PRN

## 2020-02-21 MED ORDER — ASPIRIN 325 MG PO TABS
325.0000 mg | ORAL_TABLET | Freq: Once | ORAL | Status: AC
  Administered 2020-02-21: 325 mg via ORAL
  Filled 2020-02-21: qty 1

## 2020-02-21 MED ORDER — ACETAMINOPHEN 650 MG RE SUPP: 650.0000 mg | RECTAL | Status: DC | PRN

## 2020-02-21 MED ORDER — PANTOPRAZOLE SODIUM 40 MG PO TBEC
40.0000 mg | DELAYED_RELEASE_TABLET | Freq: Every day | ORAL | Status: DC
  Administered 2020-02-21 – 2020-02-22 (×2): 40 mg via ORAL
  Filled 2020-02-21 (×2): qty 1

## 2020-02-21 MED ORDER — CITALOPRAM HYDROBROMIDE 10 MG PO TABS
10.0000 mg | ORAL_TABLET | Freq: Every day | ORAL | Status: DC
  Administered 2020-02-21 – 2020-02-22 (×2): 10 mg via ORAL
  Filled 2020-02-21 (×2): qty 1

## 2020-02-21 MED ORDER — STROKE: EARLY STAGES OF RECOVERY BOOK
Freq: Once | Status: AC
  Administered 2020-02-21: 1
  Filled 2020-02-21: qty 1

## 2020-02-21 MED ORDER — IOHEXOL 350 MG/ML SOLN
75.0000 mL | Freq: Once | INTRAVENOUS | Status: AC | PRN
  Administered 2020-02-21: 75 mL via INTRAVENOUS

## 2020-02-21 MED ORDER — HYDROCODONE-ACETAMINOPHEN 7.5-325 MG PO TABS
1.0000 | ORAL_TABLET | Freq: Three times a day (TID) | ORAL | Status: DC | PRN
  Administered 2020-02-21 – 2020-02-22 (×3): 1 via ORAL
  Filled 2020-02-21 (×3): qty 1

## 2020-02-21 MED ORDER — PRAVASTATIN SODIUM 40 MG PO TABS: 20.0000 mg | ORAL_TABLET | Freq: Every day | ORAL | Status: DC

## 2020-02-21 MED ORDER — SODIUM CHLORIDE 0.9 % IV SOLN: INTRAVENOUS | Status: DC

## 2020-02-21 NOTE — Evaluation (Signed)
Physical Therapy Evaluation Patient Details Name: Ruben Barrera MRN: [id number] DOB: [birth year] Today's Date: 02/21/2020   History of Present Illness  Pt is an 86 year old male who presented with multiple intermittent bouts of slurred speech, confusion, and blurred/double vision since 02/16/20. He has also been displaying gait abnormalities since October. CT and MRI of head revealed possible acute or subacute R middle cerebellar peduncle infarct. NIHSS 0-2. PMH: lumbar spinal stenosis, HTN, dysrhythmia, and arthritis.   Clinical Impression  Pt presents to the hospital with the condition mentioned above and deficits mentioned below, see PT Problem List. At baseline, pt utilizes 3-wheeled walker when quickly getting up in the morning but otherwise a hurry-cane and is independent with all functional mobility with his AD/AE. He lives in a single story house with 3 STE and 1 handrail on the R when ascending and his wife is available 24/7. This date, pt displays cognitive deficits including impulsivity, agitation, and decreased awareness of deficits and safety awareness placing him at risk for falls. In addition, he demonstrates dysdiadochokinesia and dysmetria in his R leg that result in him dragging his R foot causing him to trip and lose balance intermittently when ambulating or negotiating stairs with 1 UE support. When he loses balance he requires min-modAx1 to recover his balance. Extensive pt education and wife educated separately on importance of continued PT services in the acute care setting and CIR setting to address his deficits to maximize his independence and safety to prevent falls and injuries and thus disruption of his plans to receive back surgery.    Follow Up Recommendations CIR;Supervision for mobility/OOB    Equipment Recommendations  Rolling walker with 5" wheels;3in1 (PT)    Recommendations for Other Services Rehab consult     Precautions / Restrictions Precautions Precautions:  Fall Restrictions Weight Bearing Restrictions: No      Mobility  Bed Mobility Overal bed mobility: Needs Assistance Bed Mobility: Supine to Sit;Sit to Supine     Supine to sit: Supervision Sit to supine: Supervision   General bed mobility comments: Pt able to transition supine <> sit with quick movements with bed flat and no use of bed rails, supervision for safety.    Transfers Overall transfer level: Needs assistance Equipment used: Straight cane Transfers: Sit to/from Stand Sit to Stand: Min assist         General transfer comment: Pt impulsive in coming to stand when directed to remain seated, minA for steadying dur to trunk sway.  Ambulation/Gait Ambulation/Gait assistance: Mod assist;Min assist Gait Distance (Feet): 100 Feet Assistive device: Straight cane Gait Pattern/deviations: Step-through pattern;Decreased stride length;Narrow base of support Gait velocity: decreased Gait velocity interpretation: 1.31 - 2.62 ft/sec, indicative of limited community ambulator General Gait Details: Pt ambulates with narrow BOS and intermittent L toe drag resulting in bouts of LOB and therefore min-modA to recover his balance during LOB bouts. Pt does not utilize [facility] apporpriately with gait as he sometimes carries it.  Stairs Stairs: Yes Stairs assistance: Min assist;Mod assist Stair Management: One rail Right;One rail Left;Alternating pattern Number of Stairs: 4 General stair comments: Ascending with use of R hand rail and descending with L, requiring repeated cues for proper hand placement to simulate home. Displays sequencing and coordination deficits with poor R foot clearance, placing him at risk for falls and him requiring min-modA to maintain his safety. Several mini-tripping bouts of stairs due to poor foot clearance. Extensive cues provided to improve deficits, with no success.  Wheelchair Mobility  Modified Rankin (Stroke Patients Only) Modified Rankin (Stroke  Patients Only) Pre-Morbid Rankin Score: No symptoms Modified Rankin: Moderately severe disability     Balance Overall balance assessment: Needs assistance Sitting-balance support: Feet supported Sitting balance-Leahy Scale: Good Sitting balance - Comments: Pt able to reach off BOS to reach feet without LOB and min guard for safety.   Standing balance support: Single extremity supported;During functional activity Standing balance-Leahy Scale: Poor Standing balance comment: 1 UE support on cane with mobility and min guard-modA due to bouts of LOB.                             Pertinent Vitals/Pain Pain Assessment: No/denies pain    Home Living Family/patient expects to be discharged to:: Private residence Living Arrangements: Spouse/significant other Available Help at Discharge: Available 24 hours/day Type of Home: House Home Access: Stairs to enter Entrance Stairs-Rails: Right (Ascending) Entrance Stairs-Number of Steps: 3 Home Layout: One level Home Equipment: Other (comment);Wheelchair - manual;Hand held shower head;Grab bars - tub/shower;Walker - 2 wheels (Hurry cane, 3-wheeled walker w/o seat)      Prior Function Level of Independence: Independent with assistive device(s)         Comments: Independent with all mobility with use of hurrycane in R hand and int 3-wheeled walker in morning. Patient was still driving.      Hand Dominance   Dominant Hand: Right    Extremity/Trunk Assessment   Upper Extremity Assessment Upper Extremity Assessment: Defer to OT evaluation LUE Sensation: WNL LUE Coordination: decreased fine motor;decreased gross motor    Lower Extremity Assessment Lower Extremity Assessment: RLE deficits/detail;LLE deficits/detail RLE Deficits / Details: MMT score of 4+ to 5 grossly throughout RLE Sensation: WNL (light touch and dynamic proprio intact) RLE Coordination: decreased fine motor;decreased gross motor (dysmetria and  dysdiadochokinesia noted) LLE Deficits / Details: MMT score of 4+ to 5 grossly throughout LLE Sensation: WNL (light touch and dynamic proprio intact) LLE Coordination: WNL    Cervical / Trunk Assessment Cervical / Trunk Assessment: Normal  Communication   Communication: Expressive difficulties  Cognition Arousal/Alertness: Awake/alert Behavior During Therapy: Agitated;Restless;Impulsive Overall Cognitive Status: Impaired/Different from baseline Area of Impairment: Attention;Memory;Safety/judgement;Awareness;Following commands;Problem solving                   Current Attention Level: Selective Memory: Decreased short-term memory Following Commands: Follows one step commands inconsistently;Follows one step commands with increased time Safety/Judgement: Decreased awareness of safety;Decreased awareness of deficits Awareness: Emergent Problem Solving: Slow processing;Difficulty sequencing;Requires verbal cues;Requires tactile cues General Comments: Pt impulsive and requires extensive cues to maintain safety through providing single step commands to stop regularly and listen to directions. Pt agitated and refuses to follow cues to not get out of chair without assistance. LOB and tripping on R foot during session with pt in denial of this placing him at risk for falls and injury. A&Ox4. Wife states that her husband is not normally this agitated and resistant or unsafe.      General Comments General comments (skin integrity, edema, etc.): BP 152/100 sitting in recliner start of session    Exercises     Assessment/Plan    PT Assessment Patient needs continued PT services  PT Problem List Decreased activity tolerance;Decreased balance;Decreased mobility;Decreased coordination;Decreased cognition;Decreased knowledge of use of DME;Decreased safety awareness;Decreased knowledge of precautions       PT Treatment Interventions DME instruction;Gait training;Stair training;Functional  mobility training;Therapeutic activities;Therapeutic exercise;Balance training;Neuromuscular re-education;Cognitive remediation;Patient/family education  PT Goals (Current goals can be found in the Care Plan section)  Acute Rehab PT Goals Patient Stated Goal: to go home and get back surgery PT Goal Formulation: With patient/family Time For Goal Achievement: 03/08/20 Potential to Achieve Goals: Fair    Frequency Min 4X/week   Barriers to discharge        Co-evaluation               AM-PAC PT "6 Clicks" Mobility  Outcome Measure Help needed turning from your back to your side while in a flat bed without using bedrails?: None Help needed moving from lying on your back to sitting on the side of a flat bed without using bedrails?: None Help needed moving to and from a bed to a chair (including a wheelchair)?: A Little Help needed standing up from a chair using your arms (e.g., wheelchair or bedside chair)?: A Little Help needed to walk in hospital room?: A Lot Help needed climbing 3-5 steps with a railing? : A Lot 6 Click Score: 18    End of Session Equipment Utilized During Treatment: Gait belt Activity Tolerance: Treatment limited secondary to agitation;Patient tolerated treatment well Patient left: in chair;with call bell/phone within reach;with chair alarm set Nurse Communication: Mobility status;Precautions;Other (comment) (impulsive to get up out of chair despite alarms) PT Visit Diagnosis: Unsteadiness on feet (R26.81);Other abnormalities of gait and mobility (R26.89);Difficulty in walking, not elsewhere classified (R26.2);Other symptoms and signs involving the nervous system (R29.898)    Time: 5643-3295 PT Time Calculation (min) (ACUTE ONLY): 24 min   Charges:   PT Evaluation $PT Eval Moderate Complexity: 1 Mod PT Treatments $Gait Training: 8-22 mins        Moishe Spice, PT, DPT Acute Rehabilitation Services  Pager: [phone] Office:  [phone]   Orvan Falconer 02/21/2020, 10:41 AM

## 2020-02-21 NOTE — Progress Notes (Signed)
SLP Cancellation Note  Patient Details Name: Ruben Barrera MRN: [id number] DOB: 06-Jul-1932   Cancelled treatment:       Reason Eval/Treat Not Completed: Other (comment) (pt working with PT) will continue efforts for completion of speech language evaluation.    Person, CCC-SLP Acute Rehabilitation Services 02/21/2020, 10:07 AM

## 2020-02-21 NOTE — Consult Note (Signed)
Physical Medicine and Rehabilitation Consult Reason for Consult: Slurred speech with right side weakness Referring Physician: [facility]    HPI: Ruben Barrera is a 86 y.o. right-handed male with history of hypertension, hyperlipidemia and tobacco abuse.  History taken from chart review, wife, and patient.  Patient lives with spouse.  1 level home 3 steps to entry.  Independent with assistive device and still driving.  He presented on 02/20/2020 with dysarthria and left hemiparesis.  CT of the head showed mild diffuse cortical atrophy.  Low-density noted in the region of the right cerebellar peduncle concerning for infarct of indeterminate age.  Patient did not receive TPA.  MRI showed area of abnormal signal within the right middle cerebellar peduncle.  CT angiogram of head and neck no emergent finding no evidence of cerebellar branch occlusion.  At least 70% left ICA origin stenosis.  Echocardiogram performed, results pending.  Currently maintained on aspirin and Plavix for CVA prophylaxis.  Tolerating a regular diet.  Hospital course further complicated by pain, requiring as needed Norco.  Therapy evaluations completed with recommendations of physical medicine rehab consult.  Review of Systems  Constitutional: Positive for malaise/fatigue. Negative for chills and fever.  HENT: Negative for hearing loss.   Eyes: Negative for blurred vision.  Respiratory: Negative for cough and shortness of breath.   Cardiovascular: Negative for chest pain and leg swelling.  Gastrointestinal: Positive for constipation. Negative for heartburn, nausea and vomiting.       GERD  Genitourinary: Negative for dysuria and hematuria.  Skin: Negative for rash.  Neurological: Positive for focal weakness. Negative for sensory change and speech change.       Restless leg syndrome  Psychiatric/Behavioral: Positive for depression.  All other systems reviewed and are negative.  Past Medical History:  Diagnosis Date  .  Arthritis   . Complication of anesthesia    "had the shivers" after surgery  . Depression   . Dysrhythmia    "extra heart beat"  . GERD (gastroesophageal reflux disease)   . History of hiatal hernia   . Hypercholesteremia   . Hypertension   . Lumbar spinal stenosis   . RLS (restless legs syndrome)    takes ativan as needed   Past Surgical History:  Procedure Laterality Date  . BACK SURGERY    . LUMBAR LAMINECTOMY/DECOMPRESSION MICRODISCECTOMY N/A 08/28/2015   Procedure: Lumbar Four-Five decompressive lumbar laminectomy;  Surgeon: Jovita Gamma, MD;  Location: [facility];  Service: Neurosurgery;  Laterality: N/A;  . REPLACEMENT TOTAL KNEE BILATERAL Bilateral 11/05/2003  . TOE AMPUTATION Right 2010   second toe  . TONSILLECTOMY     Family History  Problem Relation Age of Onset  . Stroke Mother   . Hypertension Other        family history   Social History:  reports that he has been smoking cigars. He has never used smokeless tobacco. He reports current alcohol use. He reports that he does not use drugs. Allergies:  Allergies  Allergen Reactions  . Simvastatin Other (See Comments)    Leg pains   . Other Other (See Comments)    UNSPECIFIED REACTION  Reaction to anesthesia per patient   Medications Prior to Admission  Medication Sig Dispense Refill  . amLODipine (NORVASC) 2.5 MG tablet Take 2.5 mg by mouth every evening.     . baclofen (LIORESAL) 10 MG tablet Take 10 mg by mouth 3 (three) times daily as needed for muscle spasms.     Marland Kitchen  celecoxib (CELEBREX) 200 MG capsule Take 200 mg by mouth daily.     . citalopram (CELEXA) 10 MG tablet Take 10 mg by mouth daily.    Marland Kitchen ezetimibe (ZETIA) 10 MG tablet Take 10 mg by mouth every evening.     . fluticasone (FLONASE) 50 MCG/ACT nasal spray Place 1 spray into both nostrils daily as needed for allergies.     Marland Kitchen gabapentin (NEURONTIN) 400 MG capsule Take 400 mg by mouth daily.    Marland Kitchen gabapentin (NEURONTIN) 600 MG tablet Take 600 mg by  mouth 3 (three) times daily.    Marland Kitchen HYDROcodone-acetaminophen (NORCO) 7.5-325 MG tablet Take 1 tablet by mouth every 8 (eight) hours as needed for moderate pain.    Marland Kitchen LORazepam (ATIVAN) 1 MG tablet Take 1 mg by mouth daily.     . MULTIPLE VITAMIN PO Take 1 tablet by mouth daily.     Marland Kitchen omeprazole (PRILOSEC) 20 MG capsule Take 20 mg by mouth daily.    . tamsulosin (FLOMAX) 0.4 MG CAPS capsule Take 0.4 mg by mouth daily.     Marland Kitchen HYDROcodone-acetaminophen (NORCO/VICODIN) 5-325 MG tablet Take 1-2 tablets by mouth every 4 (four) hours as needed (mild pain). (Patient not taking: Reported on 02/20/2020) 60 tablet 0  . HYDROcodone-acetaminophen (NORCO/VICODIN) 5-325 MG tablet Take 1-2 tablets by mouth every 4 (four) hours as needed (pain). (Patient not taking: Reported on 02/20/2020) 60 tablet 0    Home: Home Living Family/patient expects to be discharged to:: Private residence Living Arrangements: Spouse/significant other Available Help at Discharge: Available 24 hours/day Type of Home: House Home Access: Stairs to enter [employer] of Steps: 3 Entrance Stairs-Rails: Right (Ascending) Home Layout: One level Bathroom Shower/Tub: Gaffer, Door [employer] Toilet: Standard Bathroom Accessibility: Yes Home Equipment: Other (comment), Wheelchair - manual, Hand held shower head, Grab bars - tub/shower, Walker - 2 wheels (Hurry cane, 3-wheeled walker w/o seat)  Functional History: Prior Function Level of Independence: Independent with assistive device(s) Comments: Independent with all mobility with use of hurrycane in R hand and int 3-wheeled walker in morning. Patient was still driving.  Functional Status:  Mobility: Bed Mobility Overal bed mobility: Needs Assistance Bed Mobility: Supine to Sit, Sit to Supine Supine to sit: Supervision Sit to supine: Supervision General bed mobility comments: Pt able to transition supine <> sit with quick movements with bed flat and no use of bed  rails, supervision for safety. Transfers Overall transfer level: Needs assistance Equipment used: Straight cane Transfers: Sit to/from Stand Sit to Stand: Min assist Stand pivot transfers: Min guard, Min assist General transfer comment: Pt impulsive in coming to stand when directed to remain seated, minA for steadying dur to trunk sway. Ambulation/Gait Ambulation/Gait assistance: Mod assist, Min assist Gait Distance (Feet): 100 Feet Assistive device: Straight cane Gait Pattern/deviations: Step-through pattern, Decreased stride length, Narrow base of support General Gait Details: Pt ambulates with narrow BOS and intermittent L toe drag resulting in bouts of LOB and therefore min-modA to recover his balance during LOB bouts. Pt does not utilize [facility] apporpriately with gait as he sometimes carries it. Gait velocity: decreased Gait velocity interpretation: 1.31 - 2.62 ft/sec, indicative of limited community ambulator Stairs: Yes Stairs assistance: Min assist, Mod assist Stair Management: One rail Right, One rail Left, Alternating pattern Number of Stairs: 4 General stair comments: Ascending with use of R hand rail and descending with L, requiring repeated cues for proper hand placement to simulate home. Displays sequencing and coordination deficits with poor R  foot clearance, placing him at risk for falls and him requiring min-modA to maintain his safety. Several mini-tripping bouts of stairs due to poor foot clearance. Extensive cues provided to improve deficits, with no success.    ADL: ADL Overall ADL's : Needs assistance/impaired Grooming: Wash/dry hands, Oral care, Min guard, Standing Grooming Details (indicate cue type and reason): Patient with difficulty organizing and sequencing oral hygiene and hand washing tasks requiring verbal cues. Patient very impulsive with low frustration tolerance. Min guard to steady without use of AD.  Lower Body Dressing: Min guard, Sit to/from stand Lower  Body Dressing Details (indicate cue type and reason): Supervision A to doff/don footwear and Min guard to don LB clothing in sitting/standing for safety 2/2 impulsivity and decreased safety awareness.  Toilet Transfer: [employer] features editor Details (indicate cue type and reason): Min guard for safety 2/2 impulsivity and decreased safety awareness.  Functional mobility during ADLs: Min guard General ADL Comments: Min guard with short-distance ambulation in room 2/2 decreased safety awareness and impulsivity.   Cognition: Cognition Overall Cognitive Status: Impaired/Different from baseline Cognition Arousal/Alertness: Awake/alert Behavior During Therapy: Agitated, Restless, Impulsive Overall Cognitive Status: Impaired/Different from baseline Area of Impairment: Attention, Memory, Safety/judgement, Awareness, Following commands, Problem solving Current Attention Level: Selective Memory: Decreased short-term memory Following Commands: Follows one step commands inconsistently, Follows one step commands with increased time Safety/Judgement: Decreased awareness of safety, Decreased awareness of deficits Awareness: Emergent Problem Solving: Slow processing, Difficulty sequencing, Requires verbal cues, Requires tactile cues General Comments: Pt impulsive and requires extensive cues to maintain safety through providing single step commands to stop regularly and listen to directions. Pt agitated and refuses to follow cues to not get out of chair without assistance. LOB and tripping on R foot during session with pt in denial of this placing him at risk for falls and injury. A&Ox4. Wife states that her husband is not normally this agitated and resistant or unsafe.  Blood pressure (!) 154/104, pulse 84, temperature 97.9 F (36.6 C), temperature source Oral, resp. rate 18, SpO2 97 %. Physical Exam Vitals reviewed.  Constitutional:      General: He is not in acute distress.    Appearance: Normal  appearance. He is normal weight.  HENT:     Head: Normocephalic and atraumatic.     Right Ear: External ear normal.     Left Ear: External ear normal.     Nose: Nose normal.  Eyes:     General:        Right eye: Discharge present.        Left eye: No discharge.     Extraocular Movements: Extraocular movements intact.  Cardiovascular:     Heart sounds: Murmur heard.      Comments: ?  Irregular rhythm. Pulmonary:     Effort: Pulmonary effort is normal. No respiratory distress.     Breath sounds: Normal breath sounds.  Abdominal:     General: Abdomen is flat. Bowel sounds are normal. There is no distension.  Musculoskeletal:     Cervical back: Normal range of motion and neck supple.     Comments: Right hallux valgus deformity No edema or tenderness in extremities  Skin:    General: Skin is warm and dry.  Neurological:     Mental Status: He is alert.     Comments: Patient is alert and oriented x3 Makes eye contact with examiner.   Follows commands. Motor: RUE: 5/5 proximal distal RLE: 4+-5/5 proximal distal LUE/LLE: 5/5 proximal  distally Sensation intact light touch  Psychiatric:        Mood and Affect: Mood normal.        Behavior: Behavior normal.        Thought Content: Thought content normal.     Results for orders placed or performed during the hospital encounter of 02/20/20 (from the past 24 hour(s))  Ethanol     Status: None   Collection Time: 02/20/20 12:43 PM  Result Value Ref Range   Alcohol, Ethyl (B) <10 <10 mg/dL  Protime-INR     Status: None   Collection Time: 02/20/20 12:43 PM  Result Value Ref Range   Prothrombin Time 13.8 11.4 - 15.2 seconds   INR 1.1 0.8 - 1.2  APTT     Status: None   Collection Time: 02/20/20 12:43 PM  Result Value Ref Range   aPTT 33 24 - 36 seconds  CBC     Status: None   Collection Time: 02/20/20 12:43 PM  Result Value Ref Range   WBC 6.4 4.0 - 10.5 K/uL   RBC 5.01 4.22 - 5.81 MIL/uL   Hemoglobin 15.9 13.0 - 17.0 g/dL    HCT 47.5 39 - 52 %   MCV 94.8 80.0 - 100.0 fL   MCH 31.7 26.0 - 34.0 pg   MCHC 33.5 30.0 - 36.0 g/dL   RDW 12.7 11.5 - 15.5 %   Platelets 247 150 - 400 K/uL   nRBC 0.0 0.0 - 0.2 %  Differential     Status: None   Collection Time: 02/20/20 12:43 PM  Result Value Ref Range   Neutrophils Relative % 65 %   Neutro Abs 4.2 1.7 - 7.7 K/uL   Lymphocytes Relative 19 %   Lymphs Abs 1.2 0.7 - 4.0 K/uL   Monocytes Relative 13 %   Monocytes Absolute 0.8 0.1 - 1.0 K/uL   Eosinophils Relative 2 %   Eosinophils Absolute 0.1 0.0 - 0.5 K/uL   Basophils Relative 1 %   Basophils Absolute 0.1 0.0 - 0.1 K/uL   Immature Granulocytes 0 %   Abs Immature Granulocytes 0.02 0.00 - 0.07 K/uL  Comprehensive metabolic panel     Status: Abnormal   Collection Time: 02/20/20 12:43 PM  Result Value Ref Range   Sodium 137 135 - 145 mmol/L   Potassium 3.8 3.5 - 5.1 mmol/L   Chloride 103 98 - 111 mmol/L   CO2 26 22 - 32 mmol/L   Glucose, Bld 104 (H) 70 - 99 mg/dL   BUN 22 8 - 23 mg/dL   Creatinine, Ser 0.92 0.61 - 1.24 mg/dL   Calcium 8.9 8.9 - 10.3 mg/dL   Total Protein 7.4 6.5 - 8.1 g/dL   Albumin 4.5 3.5 - 5.0 g/dL   AST 21 15 - 41 U/L   ALT 15 0 - 44 U/L   Alkaline Phosphatase 68 38 - 126 U/L   Total Bilirubin 0.9 0.3 - 1.2 mg/dL   GFR, Estimated >60 >60 mL/min   Anion gap 8 5 - 15  I-stat chem 8, ED     Status: Abnormal   Collection Time: 02/20/20  1:05 PM  Result Value Ref Range   Sodium 141 135 - 145 mmol/L   Potassium 3.9 3.5 - 5.1 mmol/L   Chloride 103 98 - 111 mmol/L   BUN 23 8 - 23 mg/dL   Creatinine, Ser 1.00 0.61 - 1.24 mg/dL   Glucose, Bld 102 (H) 70 - 99 mg/dL   Calcium,  Ion 1.18 1.15 - 1.40 mmol/L   TCO2 26 22 - 32 mmol/L   Hemoglobin 16.3 13.0 - 17.0 g/dL   HCT 48.0 39 - 52 %  Urine rapid drug screen (hosp performed)     Status: Abnormal   Collection Time: 02/20/20  8:20 PM  Result Value Ref Range   Opiates POSITIVE (A) NONE DETECTED   Cocaine NONE DETECTED NONE DETECTED    Benzodiazepines NONE DETECTED NONE DETECTED   Amphetamines NONE DETECTED NONE DETECTED   Tetrahydrocannabinol NONE DETECTED NONE DETECTED   Barbiturates NONE DETECTED NONE DETECTED  Urinalysis, Routine w reflex microscopic Urine, Clean Catch     Status: Abnormal   Collection Time: 02/20/20  8:20 PM  Result Value Ref Range   Color, Urine STRAW (A) YELLOW   APPearance CLEAR CLEAR   Specific Gravity, Urine 1.012 1.005 - 1.030   pH 7.0 5.0 - 8.0   Glucose, UA NEGATIVE NEGATIVE mg/dL   Hgb urine dipstick NEGATIVE NEGATIVE   Bilirubin Urine NEGATIVE NEGATIVE   Ketones, ur 5 (A) NEGATIVE mg/dL   Protein, ur NEGATIVE NEGATIVE mg/dL   Nitrite NEGATIVE NEGATIVE   Leukocytes,Ua NEGATIVE NEGATIVE  Resp Panel by RT-PCR (Flu A&B, Covid) Nasopharyngeal Swab     Status: None   Collection Time: 02/20/20  8:55 PM   Specimen: Nasopharyngeal Swab; Nasopharyngeal(NP) swabs in vial transport medium  Result Value Ref Range   SARS Coronavirus 2 by RT PCR NEGATIVE NEGATIVE   Influenza A by PCR NEGATIVE NEGATIVE   Influenza B by PCR NEGATIVE NEGATIVE  Hemoglobin A1c     Status: None   Collection Time: 02/21/20  2:53 AM  Result Value Ref Range   Hgb A1c MFr Bld 5.6 4.8 - 5.6 %   Mean Plasma Glucose 114.02 mg/dL  Lipid panel     Status: Abnormal   Collection Time: 02/21/20  2:53 AM  Result Value Ref Range   Cholesterol 204 (H) 0 - 200 mg/dL   Triglycerides 199 (H) <150 mg/dL   HDL 42 >40 mg/dL   Total CHOL/HDL Ratio 4.9 RATIO   VLDL 40 0 - 40 mg/dL   LDL Cholesterol 122 (H) 0 - 99 mg/dL   CT Code Stroke CTA Head W/WO contrast  Result Date: 02/21/2020 CLINICAL DATA:  Stroke workup EXAM: CT ANGIOGRAPHY HEAD AND NECK TECHNIQUE: Multidetector CT imaging of the head and neck was performed using the standard protocol during bolus administration of intravenous contrast. Multiplanar CT image reconstructions and MIPs were obtained to evaluate the vascular anatomy. Carotid stenosis measurements (when applicable)  are obtained utilizing NASCET criteria, using the distal internal carotid diameter as the denominator. CONTRAST:  26mL OMNIPAQUE IOHEXOL 350 MG/ML SOLN COMPARISON:  CT and brain MRI from yesterday. FINDINGS: CTA NECK FINDINGS Aortic arch: Atheromatous plaque.  Three vessel branching. Right carotid system: Scattered mainly calcified atheromatous plaque primarily at the bifurcation. No flow limiting stenosis, ulceration, or beading. Left carotid system: Calcified plaque primarily at the bifurcation/bulb with at least 70% stenosis as measured on sagittal reformats. No ulceration or beading. Vertebral arteries: Proximal subclavian atherosclerosis without flow limiting stenosis. The left vertebral artery is dominant. Vertebral arteries are smooth and widely patent to the dura. Skeleton: Advanced, generalized disc and facet degeneration with C3-4 anterolisthesis. Partially covered left glenohumeral joint with intra-articular bodies in the medial recess. Other neck: No masslike or inflammatory finding seen. Upper chest: No acute finding Review of the MIP images confirms the above findings CTA HEAD FINDINGS Anterior circulation: Calcified plaque along  the carotid siphons. No branch occlusion, beading, or proximal flow limiting stenosis. Negative for aneurysm. Posterior circulation: Left dominant vertebral artery. Patent bilateral PICA. Patent bilateral superior cerebellar arteries with symmetric enhancement. A faint right AICA is present. Advanced right P2 segment stenosis. High-grade left P3 segment stenosis. No beading or aneurysm. Venous sinuses: Unremarkable in the arterial phase Anatomic variants: None significant Review of the MIP images confirms the above findings IMPRESSION: 1. No emergent finding.  No evident cerebellar branch occlusion. 2. At least 70% left ICA origin stenosis. 3. High-grade right P2 and left P3 atheromatous stenoses. Electronically Signed   By: Monte Fantasia M.D.   On: 02/21/2020 08:13   CT  HEAD WO CONTRAST  Result Date: 02/20/2020 CLINICAL DATA:  Slurred speech, blurred vision. EXAM: CT HEAD WITHOUT CONTRAST TECHNIQUE: Contiguous axial images were obtained from the base of the skull through the vertex without intravenous contrast. COMPARISON:  May 24, 2019. FINDINGS: Brain: Mild diffuse cortical atrophy is noted. Mild chronic ischemic white matter disease is noted. No mass effect or midline shift is noted. Ventricular size is within normal limits. There is no evidence of mass lesion or hemorrhage. Low density is noted in the region of the right cerebellar peduncle concerning for infarction of indeterminate age. Vascular: No hyperdense vessel or unexpected calcification. Skull: Normal. Negative for fracture or focal lesion. Sinuses/Orbits: No acute finding. Other: None. IMPRESSION: Mild diffuse cortical atrophy. Mild chronic ischemic white matter disease. Low density is noted in the region of the right cerebellar peduncle concerning for infarction of indeterminate age. MRI is recommended for further evaluation. Electronically Signed   By: Marijo Conception M.D.   On: 02/20/2020 13:12   CT Code Stroke CTA Neck W/WO contrast  Result Date: 02/21/2020 CLINICAL DATA:  Stroke workup EXAM: CT ANGIOGRAPHY HEAD AND NECK TECHNIQUE: Multidetector CT imaging of the head and neck was performed using the standard protocol during bolus administration of intravenous contrast. Multiplanar CT image reconstructions and MIPs were obtained to evaluate the vascular anatomy. Carotid stenosis measurements (when applicable) are obtained utilizing NASCET criteria, using the distal internal carotid diameter as the denominator. CONTRAST:  21mL OMNIPAQUE IOHEXOL 350 MG/ML SOLN COMPARISON:  CT and brain MRI from yesterday. FINDINGS: CTA NECK FINDINGS Aortic arch: Atheromatous plaque.  Three vessel branching. Right carotid system: Scattered mainly calcified atheromatous plaque primarily at the bifurcation. No flow limiting  stenosis, ulceration, or beading. Left carotid system: Calcified plaque primarily at the bifurcation/bulb with at least 70% stenosis as measured on sagittal reformats. No ulceration or beading. Vertebral arteries: Proximal subclavian atherosclerosis without flow limiting stenosis. The left vertebral artery is dominant. Vertebral arteries are smooth and widely patent to the dura. Skeleton: Advanced, generalized disc and facet degeneration with C3-4 anterolisthesis. Partially covered left glenohumeral joint with intra-articular bodies in the medial recess. Other neck: No masslike or inflammatory finding seen. Upper chest: No acute finding Review of the MIP images confirms the above findings CTA HEAD FINDINGS Anterior circulation: Calcified plaque along the carotid siphons. No branch occlusion, beading, or proximal flow limiting stenosis. Negative for aneurysm. Posterior circulation: Left dominant vertebral artery. Patent bilateral PICA. Patent bilateral superior cerebellar arteries with symmetric enhancement. A faint right AICA is present. Advanced right P2 segment stenosis. High-grade left P3 segment stenosis. No beading or aneurysm. Venous sinuses: Unremarkable in the arterial phase Anatomic variants: None significant Review of the MIP images confirms the above findings IMPRESSION: 1. No emergent finding.  No evident cerebellar branch occlusion. 2. At least 70%  left ICA origin stenosis. 3. High-grade right P2 and left P3 atheromatous stenoses. Electronically Signed   By: Monte Fantasia M.D.   On: 02/21/2020 08:13   MR BRAIN WO CONTRAST  Result Date: 02/20/2020 CLINICAL DATA:  Right-sided weakness, slurred speech, blurry vision EXAM: MRI HEAD WITHOUT CONTRAST TECHNIQUE: Multiplanar, multiecho pulse sequences of the brain and surrounding structures were obtained without intravenous contrast. COMPARISON:  05/24/2019 FINDINGS: Brain: There is area of mildly reduced diffusion within the right brachium pontis  measuring approximately 1.4 cm. There is corresponding T2 hyperintensity with rounded appearance. Additional patchy and confluent areas of T2 hyperintensity in the supratentorial white matter are nonspecific but probably reflect moderate to advanced chronic microvascular ischemic changes. Small chronic right cerebellar infarct. There is no intracranial hemorrhage. There is no intracranial mass or significant mass effect. There is no hydrocephalus or extra-axial fluid collection. Prominence of the ventricles and sulci reflects generalized parenchymal volume loss similar to the prior study. Vascular: Major vessel flow voids at the skull base are preserved. Skull and upper cervical spine: Normal marrow signal is preserved. Sinuses/Orbits: Minor mucosal thickening. Bilateral lens replacements. Other: Sella is unremarkable.  Mastoid air cells are clear. IMPRESSION: Area of abnormal signal within the right middle cerebellar peduncle may reflect acute to subacute infarct. Given rounded appearance, follow-up is recommended ensure appropriate evolution (suggest addition of post contrast imaging at that time). Moderate to advanced chronic microvascular ischemic changes. Small chronic right cerebellar infarct. Electronically Signed   By: Macy Mis M.D.   On: 02/20/2020 15:44   MR BRAIN W CONTRAST  Result Date: 02/20/2020 CLINICAL DATA:  Right middle cerebellar peduncle lesion. EXAM: MRI HEAD WITH CONTRAST TECHNIQUE: Multiplanar, multiecho pulse sequences of the brain and surrounding structures were obtained with intravenous contrast. CONTRAST:  27mL GADAVIST GADOBUTROL 1 MMOL/ML IV SOLN COMPARISON:  Brain MRI without contrast 02/20/2020 FINDINGS: There is no abnormal contrast enhancement at the site of the right middle cerebellar peduncle lesion. There is a punctate focus of contrast enhancement within the posterior right cerebellar hemisphere (series 5, image 43) that is favored to be vascular. IMPRESSION: 1. No  abnormal contrast enhancement at the site of the right middle cerebellar peduncle lesion. 2. Punctate focus of enhancement in the posterior right cerebellar hemispheres favored to be vascular, but attention on follow-up scan in 6-12 weeks recommended. Electronically Signed   By: Ulyses Jarred M.D.   On: 02/20/2020 19:19   DG CHEST PORT 1 VIEW  Result Date: 02/20/2020 CLINICAL DATA:  CVA EXAM: PORTABLE CHEST 1 VIEW COMPARISON:  10/31/2003 FINDINGS: Single frontal view of the chest demonstrates mild enlargement the cardiac silhouette, accentuated by portable AP technique. No airspace disease, effusion, or pneumothorax. Significant bilateral shoulder osteoarthritis. No acute fracture. IMPRESSION: 1. Enlarged cardiac silhouette. 2. No acute airspace disease. Electronically Signed   By: Randa Ngo M.D.   On: 02/20/2020 23:50    Assessment/Plan: Diagnosis: Right middle cerebellar peduncle infarct Stroke: Continue secondary stroke prophylaxis and Risk Factor Modification listed below:   Antiplatelet therapy:   Blood Pressure Management:  Continue current medication with prn's with permisive HTN per primary team Statin Agent:   Tobacco abuse:   Right sided hemiparesis: PT/OT for mobility, ADL training  Motor recovery: Fluoxetine Labs independently reviewed.  Records reviewed and summated above.  1. Does the need for close, 24 hr/day medical supervision in concert with the patient's rehab needs make it unreasonable for this patient to be served in a less intensive setting? Yes  2. Co-Morbidities  requiring supervision/potential complications: HTN (monitor and provide prns in accordance with increased physical exertion and pain), hyperlipidemia, tobacco abuse, depression (ensure mood does not hinder progress of therapies),?  Irregular rhythm (repeat ECG) 3. Due to safety, disease management, pain management and patient education, does the patient require 24 hr/day rehab nursing? Yes 4. Does the  patient require coordinated care of a physician, rehab nurse, therapy disciplines of PT/OT to address physical and functional deficits in the context of the above medical diagnosis(es)? Yes Addressing deficits in the following areas: balance, endurance, locomotion, strength, transferring, bathing, dressing, toileting and psychosocial support 5. Can the patient actively participate in an intensive therapy program of at least 3 hrs of therapy per day at least 5 days per week? Yes 6. The potential for patient to make measurable gains while on inpatient rehab is excellent 7. Anticipated functional outcomes upon discharge from inpatient rehab are modified independent and supervision  with PT, modified independent and supervision with OT, n/a with SLP. 8. Estimated rehab length of stay to reach the above functional goals is: 7-11 days. 9. Anticipated discharge destination: Home 10. Overall Rehab/Functional Prognosis: good  RECOMMENDATIONS: This patient's condition is appropriate for continued rehabilitative care in the following setting: CIR Patient has agreed to participate in recommended program. Yes Note that insurance prior authorization may be required for reimbursement for recommended care.  Comment: Rehab Admissions Coordinator to follow up.  I have personally performed a face to face diagnostic evaluation, including, but not limited to relevant history and physical exam findings, of this patient and developed relevant assessment and plan.  Additionally, I have reviewed and concur with the physician assistant's documentation above.   Delice Lesch, MD, ABPMR Lavon Paganini Angiulli, PA-C 02/21/2020

## 2020-02-21 NOTE — Consult Note (Addendum)
Neurology Consultation Reason for Consult: Right-sided dyscoordination Referring Physician: Hosie Poisson  CC: Difficulty writing his name  History is obtained from: Patient and chart review  HPI: Ruben Barrera is a 86 y.o. male w/ PMHx of hypertension, hyperlipidemia, cigar smoking (1 / day), and lumbar stenosis.   He reports that he was watching television on Sunday night when he had some double vision.  He did not close an eye to see if it was binocular or monocular.  He reports that the doubled objects would be side-by-side, but did not notice if it was worse on looking left or right.  Overall his symptoms were intermittent throughout the rest of Sunday night as well as Monday morning, but have since resolved.  Additionally he noticed trouble writing his name signing checks etc. starting 1.5 weeks ago.  This has been stable and is still present.  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  He had reached out to his PCP initially but only need the concern about the double vision been communicated.  Initial advice to take some aspirin.  However later when the physician was told about patient's difficulty writing and right leg weakness he was told to present to the ED for further evaluation.  Regarding medications, the patient did note that he stopped baclofen due to excessive sleepiness.  He is currently taking gabapentin 600 mg 3 times a day as well as an additional 400 mg for sleep at night.  He is also taking Norco as needed for his chronic pain.  Additionally on review of systems: - Right temporal headache, lasted 10-15 seconds Friday, no other headaches  - Headaches typically are "sinus headache"  - No jaw claudication, no clear proximal weakness (maybe some mild  difficulty reaching up for things in 3 weeks) - Mild shortness of breath at night, snores - Tested for OSA years ago, maybe worse symptoms recently   LKW: 1.5 weeks prior to admission tPA given?: No, due to out of the window Premorbid modified rankin scale:      0 - No symptoms.  Of note he had recently been evaluated for his lower extremity numbness and was felt to have a polymyopathy some from residual radiculopathy.)  ANA, ESR, B12, folate, TSH, SPEP/IFE and EMG/nerve conduction study were planned.  Labs resulted normal  "1.  Bilateral lower extremity numbness, likely a polyneuropathy.  Some may be residual from lumbar radiculopathy.  Etiology uncertain but may be due to pre-diabetes. ROS: A 14 point ROS was performed and is negative except as noted in the HPI.    Past Medical History:  Diagnosis Date  . Arthritis   . Complication of anesthesia    "had the shivers" after surgery  . Depression   . Dysrhythmia    "extra heart beat"  . GERD (gastroesophageal reflux disease)   . History of hiatal hernia   . Hypercholesteremia   . Hypertension   . Lumbar spinal stenosis   . RLS (restless legs syndrome)    takes ativan as needed     Family History  Problem Relation Age of Onset  . Stroke Mother   . Hypertension Other        family history     Social History:  reports that he has been smoking cigars.  He has never used smokeless tobacco. He reports current alcohol use. He reports that he does not use drugs.   Exam: Current vital signs: BP (!) 148/102 (BP Location: Right Arm)   Pulse 86   Temp 98.5 F (36.9 C) (Oral)   Resp 19   SpO2 99%  Vital signs in last 24 hours: Temp:  [97.8 F (36.6 C)-98.5 F (36.9 C)] 98.5 F (36.9 C) (12/01 0220) Pulse Rate:  [42-114] 86 (12/01 0130) Resp:  [11-21] 19 (12/01 0220) BP: (133-162)/(72-128) 148/102 (12/01 0220) SpO2:  [90 %-99 %] 99 % (12/01 0220)   Physical Exam  Constitutional: Appears well-developed and  well-nourished.  Psych: Affect appropriate to situation Eyes: No scleral injection HENT: No OP obstrucion MSK: no joint deformities.  Cardiovascular: Normal rate and regular rhythm.  Respiratory: Effort normal, non-labored breathing GI: Soft.  No distension. There is no tenderness.  Skin: WDI  Neuro: Mental Status: Patient is awake, alert, oriented to person, place, month, year, and situation. Patient is able to give a clear and coherent history. No signs of aphasia or neglect Cranial Nerves: II: Visual Fields are full. Pupils are equal, round, and reactive to light.   III,IV, VI: EOMI without ptosis or diploplia but somewhat choppy to the left V: Facial sensation is symmetric but with tuning fork slightly louder in the right than the left (baseline per patient) VII: Facial movement is notable for a mild flattening of the left nasolabial fold at rest which patient reports is baseline.  VIII: hearing is intact to voice,  X: Uvula elevates symmetrically XI: Shoulder shrug is symmetric. XII: tongue is midline without atrophy or fasciculations.  Motor: Tone is normal. Bulk is normal. 5/5 strength was present in all four extremities.  Sensory: Sensation is symmetric to light touch and length depenent temperature in the arms and legs. Deep Tendon Reflexes: 2+ and symmetric in the biceps, absent in the patellae Plantars: Toes are downgoing bilaterally.  Cerebellar: Finger-to-nose is intact bilaterally although he performs it more slowly on the right despite being right-handed.  Heel-to-shin is notable for right lower extremity dysmetria worse on the left  NIHSS 2 for limb ataxia  I have reviewed labs in epic and the results pertinent to this consultation are:  Lab Results  Component Value Date   ANA Negative 09/19/2019   Lab Results  Component Value Date   ESRSEDRATE 5 09/19/2019   Lab Results  Component Value Date   VITAMINB12 398 09/19/2019    Lab Results  Component  Value Date   FOLATE >24.8 09/19/2019   Lab Results  Component Value Date   TSH 0.81 09/19/2019    SPEP/IFE (mildly reduced beta globulin 0.3 (ref 0.4-0.6), mildly reduced IgM 38 -- (ref 50-300)   I have reviewed the images obtained:  New hypodensity on head CT in the right cerebellar peduncle Nonenhancing on MRI. Small enhancing lesion on MRI independent from cerebellar peduncle lesion is favored to be a vascular anomaly      Impression: This is an 86 year old man who presents with a right cerebellar peduncle lesion which is diffusion restricting most likely represents a stroke.  His history matches his examination; this is mostly expected to create issues with discoordination.  Recommendations: # Likely right cerebellar peduncle stroke - Stroke labs RPR, HgbA1c, fasting lipid panel - MRI brain completed  - CTA head and neck - Frequent neuro checks - Echocardiogram - Prophylactic therapy-Antiplatelet med: Aspirin - dose 366m PO or 3055mPR, followed by 81 mg  daily - Consider Plavix 300 mg load with 75 mg daily for 21 - 90 day course  - Risk factor modification - Telemetry monitoring; 30 day event monitor on discharge if no arrythmias captured  - Blood pressure goal   - Normotension - PT consult, OT consult, Speech consult, unless patient is back to baseline - Stroke team to follow  [place] [phone]

## 2020-02-21 NOTE — Progress Notes (Signed)
  Echocardiogram 2D Echocardiogram has been performed.  Ruben Barrera 02/21/2020, 2:12 PM

## 2020-02-21 NOTE — Progress Notes (Signed)
Inpatient Rehabilitation Admissions Coordinator  I met at bedside with patient and wife to discuss a possible Cir admit. We discussed goals and expectations of Cir and patient and wife in agreement. I will begin insurance authorization with Health team advantage. Dr. Posey Pronto to complete Consult this afternoon. Pateint was very independent and active prior to admit. 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 Very ataxic Lower extremities, impulsive and would benefit from an intensive inpatient rehab admit. I discussed with Dr. Posey Pronto and Dr. Erlinda Hong who are in agreement to the need for Cir. I will follow up tomorrow.  Danne Baxter, RN, MSN Rehab Admissions Coordinator [phone] 02/21/2020 2:41 PM

## 2020-02-21 NOTE — Progress Notes (Signed)
Rehab Admissions Coordinator Note:  Per PT and OT recommendation, this patient was screened by Raechel Ache for appropriateness for an Inpatient Acute Rehab Consult.  At this time, we are recommending Inpatient Rehab consult. AC will place order per protocol.   Raechel Ache 02/21/2020, 12:03 PM  I can be reached at [phone].

## 2020-02-21 NOTE — Progress Notes (Signed)
STROKE TEAM PROGRESS NOTE   INTERVAL HISTORY His wife is at the bedside.  Pt lying in bed, stated that he does not have weakness but more like coordination difficulty. However, he felt he is improving but still not close to his baseline. Diplopia has resolved.  However, on exam, severe ataxia bilaterally lower extremity.  PT/OT recommended CIR.  Discussed with patient and wife regarding the neccessarity of CIR, they both agreed.  Vitals:   02/21/20 0130 02/21/20 0220 02/21/20 0355 02/21/20 0826  BP: (!) 145/72 (!) 148/102 (!) 171/98 (!) 154/104  Pulse: 86  91 84  Resp: 16 19 20 18   Temp: 97.8 F (36.6 C) 98.5 F (36.9 C) 98 F (36.7 C) 97.9 F (36.6 C)  TempSrc:  Oral Oral Oral  SpO2: 98% 99% 98% 97%   CBC:  Recent Labs  Lab 02/20/20 1243 02/20/20 1305  WBC 6.4  --   NEUTROABS 4.2  --   HGB 15.9 16.3  HCT 47.5 48.0  MCV 94.8  --   PLT 247  --    Basic Metabolic Panel:  Recent Labs  Lab 02/20/20 1243 02/20/20 1305  NA 137 141  K 3.8 3.9  CL 103 103  CO2 26  --   GLUCOSE 104* 102*  BUN 22 23  CREATININE 0.92 1.00  CALCIUM 8.9  --    Lipid Panel:  Recent Labs  Lab 02/21/20 0253  CHOL 204*  TRIG 199*  HDL 42  CHOLHDL 4.9  VLDL 40  LDLCALC 122*   HgbA1c:  Recent Labs  Lab 02/21/20 0253  HGBA1C 5.6   Urine Drug Screen:  Recent Labs  Lab 02/20/20 2020  LABOPIA POSITIVE*  COCAINSCRNUR NONE DETECTED  LABBENZ NONE DETECTED  AMPHETMU NONE DETECTED  THCU NONE DETECTED  LABBARB NONE DETECTED    Alcohol Level  Recent Labs  Lab 02/20/20 1243  ETH <10    IMAGING past 24 hours CT Code Stroke CTA Head W/WO contrast  Result Date: 02/21/2020 CLINICAL DATA:  Stroke workup EXAM: CT ANGIOGRAPHY HEAD AND NECK TECHNIQUE: Multidetector CT imaging of the head and neck was performed using the standard protocol during bolus administration of intravenous contrast. Multiplanar CT image reconstructions and MIPs were obtained to evaluate the vascular anatomy. Carotid  stenosis measurements (when applicable) are obtained utilizing NASCET criteria, using the distal internal carotid diameter as the denominator. CONTRAST:  38mL OMNIPAQUE IOHEXOL 350 MG/ML SOLN COMPARISON:  CT and brain MRI from yesterday. FINDINGS: CTA NECK FINDINGS Aortic arch: Atheromatous plaque.  Three vessel branching. Right carotid system: Scattered mainly calcified atheromatous plaque primarily at the bifurcation. No flow limiting stenosis, ulceration, or beading. Left carotid system: Calcified plaque primarily at the bifurcation/bulb with at least 70% stenosis as measured on sagittal reformats. No ulceration or beading. Vertebral arteries: Proximal subclavian atherosclerosis without flow limiting stenosis. The left vertebral artery is dominant. Vertebral arteries are smooth and widely patent to the dura. Skeleton: Advanced, generalized disc and facet degeneration with C3-4 anterolisthesis. Partially covered left glenohumeral joint with intra-articular bodies in the medial recess. Other neck: No masslike or inflammatory finding seen. Upper chest: No acute finding Review of the MIP images confirms the above findings CTA HEAD FINDINGS Anterior circulation: Calcified plaque along the carotid siphons. No branch occlusion, beading, or proximal flow limiting stenosis. Negative for aneurysm. Posterior circulation: Left dominant vertebral artery. Patent bilateral PICA. Patent bilateral superior cerebellar arteries with symmetric enhancement. A faint right AICA is present. Advanced right P2 segment stenosis. High-grade left P3  segment stenosis. No beading or aneurysm. Venous sinuses: Unremarkable in the arterial phase Anatomic variants: None significant Review of the MIP images confirms the above findings IMPRESSION: 1. No emergent finding.  No evident cerebellar branch occlusion. 2. At least 70% left ICA origin stenosis. 3. High-grade right P2 and left P3 atheromatous stenoses. Electronically Signed   By: Monte Fantasia M.D.   On: 02/21/2020 08:13   CT HEAD WO CONTRAST  Result Date: 02/20/2020 CLINICAL DATA:  Slurred speech, blurred vision. EXAM: CT HEAD WITHOUT CONTRAST TECHNIQUE: Contiguous axial images were obtained from the base of the skull through the vertex without intravenous contrast. COMPARISON:  May 24, 2019. FINDINGS: Brain: Mild diffuse cortical atrophy is noted. Mild chronic ischemic white matter disease is noted. No mass effect or midline shift is noted. Ventricular size is within normal limits. There is no evidence of mass lesion or hemorrhage. Low density is noted in the region of the right cerebellar peduncle concerning for infarction of indeterminate age. Vascular: No hyperdense vessel or unexpected calcification. Skull: Normal. Negative for fracture or focal lesion. Sinuses/Orbits: No acute finding. Other: None. IMPRESSION: Mild diffuse cortical atrophy. Mild chronic ischemic white matter disease. Low density is noted in the region of the right cerebellar peduncle concerning for infarction of indeterminate age. MRI is recommended for further evaluation. Electronically Signed   By: Marijo Conception M.D.   On: 02/20/2020 13:12   CT Code Stroke CTA Neck W/WO contrast  Result Date: 02/21/2020 CLINICAL DATA:  Stroke workup EXAM: CT ANGIOGRAPHY HEAD AND NECK TECHNIQUE: Multidetector CT imaging of the head and neck was performed using the standard protocol during bolus administration of intravenous contrast. Multiplanar CT image reconstructions and MIPs were obtained to evaluate the vascular anatomy. Carotid stenosis measurements (when applicable) are obtained utilizing NASCET criteria, using the distal internal carotid diameter as the denominator. CONTRAST:  57mL OMNIPAQUE IOHEXOL 350 MG/ML SOLN COMPARISON:  CT and brain MRI from yesterday. FINDINGS: CTA NECK FINDINGS Aortic arch: Atheromatous plaque.  Three vessel branching. Right carotid system: Scattered mainly calcified atheromatous plaque primarily  at the bifurcation. No flow limiting stenosis, ulceration, or beading. Left carotid system: Calcified plaque primarily at the bifurcation/bulb with at least 70% stenosis as measured on sagittal reformats. No ulceration or beading. Vertebral arteries: Proximal subclavian atherosclerosis without flow limiting stenosis. The left vertebral artery is dominant. Vertebral arteries are smooth and widely patent to the dura. Skeleton: Advanced, generalized disc and facet degeneration with C3-4 anterolisthesis. Partially covered left glenohumeral joint with intra-articular bodies in the medial recess. Other neck: No masslike or inflammatory finding seen. Upper chest: No acute finding Review of the MIP images confirms the above findings CTA HEAD FINDINGS Anterior circulation: Calcified plaque along the carotid siphons. No branch occlusion, beading, or proximal flow limiting stenosis. Negative for aneurysm. Posterior circulation: Left dominant vertebral artery. Patent bilateral PICA. Patent bilateral superior cerebellar arteries with symmetric enhancement. A faint right AICA is present. Advanced right P2 segment stenosis. High-grade left P3 segment stenosis. No beading or aneurysm. Venous sinuses: Unremarkable in the arterial phase Anatomic variants: None significant Review of the MIP images confirms the above findings IMPRESSION: 1. No emergent finding.  No evident cerebellar branch occlusion. 2. At least 70% left ICA origin stenosis. 3. High-grade right P2 and left P3 atheromatous stenoses. Electronically Signed   By: Monte Fantasia M.D.   On: 02/21/2020 08:13   MR BRAIN WO CONTRAST  Result Date: 02/20/2020 CLINICAL DATA:  Right-sided weakness, slurred speech, blurry vision  EXAM: MRI HEAD WITHOUT CONTRAST TECHNIQUE: Multiplanar, multiecho pulse sequences of the brain and surrounding structures were obtained without intravenous contrast. COMPARISON:  05/24/2019 FINDINGS: Brain: There is area of mildly reduced diffusion  within the right brachium pontis measuring approximately 1.4 cm. There is corresponding T2 hyperintensity with rounded appearance. Additional patchy and confluent areas of T2 hyperintensity in the supratentorial white matter are nonspecific but probably reflect moderate to advanced chronic microvascular ischemic changes. Small chronic right cerebellar infarct. There is no intracranial hemorrhage. There is no intracranial mass or significant mass effect. There is no hydrocephalus or extra-axial fluid collection. Prominence of the ventricles and sulci reflects generalized parenchymal volume loss similar to the prior study. Vascular: Major vessel flow voids at the skull base are preserved. Skull and upper cervical spine: Normal marrow signal is preserved. Sinuses/Orbits: Minor mucosal thickening. Bilateral lens replacements. Other: Sella is unremarkable.  Mastoid air cells are clear. IMPRESSION: Area of abnormal signal within the right middle cerebellar peduncle may reflect acute to subacute infarct. Given rounded appearance, follow-up is recommended ensure appropriate evolution (suggest addition of post contrast imaging at that time). Moderate to advanced chronic microvascular ischemic changes. Small chronic right cerebellar infarct. Electronically Signed   By: Macy Mis M.D.   On: 02/20/2020 15:44   MR BRAIN W CONTRAST  Result Date: 02/20/2020 CLINICAL DATA:  Right middle cerebellar peduncle lesion. EXAM: MRI HEAD WITH CONTRAST TECHNIQUE: Multiplanar, multiecho pulse sequences of the brain and surrounding structures were obtained with intravenous contrast. CONTRAST:  75mL GADAVIST GADOBUTROL 1 MMOL/ML IV SOLN COMPARISON:  Brain MRI without contrast 02/20/2020 FINDINGS: There is no abnormal contrast enhancement at the site of the right middle cerebellar peduncle lesion. There is a punctate focus of contrast enhancement within the posterior right cerebellar hemisphere (series 5, image 43) that is favored to be  vascular. IMPRESSION: 1. No abnormal contrast enhancement at the site of the right middle cerebellar peduncle lesion. 2. Punctate focus of enhancement in the posterior right cerebellar hemispheres favored to be vascular, but attention on follow-up scan in 6-12 weeks recommended. Electronically Signed   By: Ulyses Jarred M.D.   On: 02/20/2020 19:19   DG CHEST PORT 1 VIEW  Result Date: 02/20/2020 CLINICAL DATA:  CVA EXAM: PORTABLE CHEST 1 VIEW COMPARISON:  10/31/2003 FINDINGS: Single frontal view of the chest demonstrates mild enlargement the cardiac silhouette, accentuated by portable AP technique. No airspace disease, effusion, or pneumothorax. Significant bilateral shoulder osteoarthritis. No acute fracture. IMPRESSION: 1. Enlarged cardiac silhouette. 2. No acute airspace disease. Electronically Signed   By: Randa Ngo M.D.   On: 02/20/2020 23:50    PHYSICAL EXAM  Temp:  [97.2 F (36.2 C)-98.5 F (36.9 C)] 97.2 F (36.2 C) (12/01 1306) Pulse Rate:  [42-114] 111 (12/01 1306) Resp:  [11-21] 18 (12/01 1306) BP: (134-171)/(72-128) 143/97 (12/01 1306) SpO2:  [90 %-99 %] 92 % (12/01 1306)  General - Well nourished, well developed, in no apparent distress.  Ophthalmologic - fundi not visualized due to noncooperation.  Cardiovascular - Regular rhythm and rate.  Mental Status -  Level of arousal and orientation to time, place, and person were intact. Language including expression, naming, repetition, comprehension was assessed and found intact. Fund of Knowledge was assessed and was intact.  Cranial Nerves II - XII - II - Visual field intact OU. III, IV, VI - Extraocular movements intact. V - Facial sensation intact bilaterally. VII - Facial movement intact bilaterally. VIII - Hearing & vestibular intact bilaterally. No nystagmus X -  Palate elevates symmetrically. XI - Chin turning & shoulder shrug intact bilaterally. XII - Tongue protrusion intact.  Motor Strength - The patient's  strength was normal in all extremities and pronator drift was absent.  Bulk was normal and fasciculations were absent.   Motor Tone - Muscle tone was assessed at the neck and appendages and was normal.  Reflexes - The patient's reflexes were symmetrical in all extremities and he had no pathological reflexes.  Sensory - Light touch, temperature/pinprick were assessed and were symmetrical.    Coordination - The patient had gross normal movements in the left hand with no ataxia or dysmetria. However, right FTN dysmetria and bilateral HTS ataxic. Tremor was absent.  Gait and Station - deferred.   ASSESSMENT/PLAN Ruben Barrera is a 86 y.o. male with history of hypertension, hyperlipidemia, cigar smoking (1 / day), and lumbar stenosis presenting with recurrent transient double vision, trouble writing his name.   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 Small vessel disease. Atrophy.   MRI w/o Possible subacute R cerebellar peduncle rounded infarct Small vessel disease. Atrophy.   MRI  W/ no abnormal enhancement  R cerebellar peduncle rounded infarct. Punctate enhancement posterior R cerebellar hemispheres felt to be vascular. F/u in 6-12 wks   CTA head & neck no ELVO. L ICA origin 70% stenosis. High-grade R P2 and L P3 stenoses.  2D Echo EF 30-35%, global HK  LDL 122  HgbA1c 5.6  VTE prophylaxis - SCDs   No antithrombotic prior to admission, now on aspirin 81 mg daily and clopidogrel 75 mg daily following load. Continue DAPT x 3 weeks then aspirin alone.    Therapy recommendations:  CIR  Disposition:  Pending, follow up with Dr. Tomi [facility]ens at [facility]   Cardiomyopathy  TTE showed EF 30 to 35%  Recommend cardiology consult for ischemic work-up  Recommend outpatient cardiac monitoring to rule out A. fib  Carotid stenosis  Left ICA origin 70% stenosis  Asymptomatic at this time  Recommend  vascular surgery outpatient follow-up  Chronic lower back pain Sciatica  Follow with neurosurgery group  Plan for spinal stimulator to be implanted later this month  Recommend to avoid low BP perioperatively given carotid stenosis  Hypertension  Stable . Long-term BP goal normotensive  Hyperlipidemia  Home meds:  zetia 10, resumed in hospital  Intolerant to simvastatin  LDL 122, goal < 70  Consider pravastatin low-dose  Tobacco abuse  Current smoker  Smoking cessation counseling provided  Pt is willing to quit  Other Stroke Risk Factors  Advanced Age >/= 65   ETOH use, alcohol level <10, advised to drink no more than 2 drink(s) a day  Family hx stroke (mother )  Other Active Problems  Erythrocytosis  BPH  Hospital day # 1  Rosalin Hawking, MD PhD Stroke Neurology 02/21/2020 7:57 PM    To contact Stroke Continuity provider, please refer to [email or web address] After hours, contact General Neurology

## 2020-02-21 NOTE — Progress Notes (Signed)
PROGRESS NOTE    BRANDUN PINN  AVW:[id number] DOB: [birth year] DOA: 02/20/2020 PCP: Lajean Manes, MD    Chief Complaint  Patient presents with  . Blurred Vision  . Aphasia    Brief Narrative:  A 86-year-old gentleman prior history of hypertension hyperlipidemia, GERD, radiculopathy presents with vision abnormalities and gait abnormalities and unsteady gait.  He was admitted for evaluation of stroke.  Neurology was consulted.  Assessment & Plan:   Active Problems:   Essential hypertension   CVA (cerebral vascular accident) ([place])   Erythrocytosis   BPH (benign prostatic hyperplasia)   Mixed hyperlipidemia   Tobacco abuse   Depression   Right cerebellar peduncle stroke Further stroke work-up in progress. CT angiogram of the head and neck showed At least 70% left ICA origin stenosis.  High-grade right P2 and left P3 atheromatous stenoses Was started on antiplatelet therapy with aspirin and Plavix. Patient is already on Zetia and he is intolerant to statins. Therapy evaluations recommending CIR. Neurology consulted and is on board and following the patient.    Essential hypertension Permissive hypertension for 72 hours.    History of BPH Continue with Flomax   Agitation, restless As needed Ativan ordered.   Peripheral neuropathy Continue with gabapentin 600 mg 3 times daily.   GERD Stable Continue with Protonix 40 mg daily.   Depression Continue with Celexa 10 mg daily   DVT prophylaxis: (Lovenox Code Status: Full code Family Communication: Wife at bedside Disposition:   Status is: Inpatient  Remains inpatient appropriate because:Unsafe d/c plan   Dispo: The patient is from: Home              Anticipated d/c is to: CIR              Anticipated d/c date is: 1 day              Patient currently is not medically stable to d/c.       Consultants:   Neurology  Procedures: Echocardiogram Antimicrobials: None Subjective: Patient wanted  to leave AMA and go home.  Objective: Vitals:   02/21/20 1306 02/21/20 1400 02/21/20 1526 02/21/20 1646  BP: (!) 143/97 (!) 144/79  (!) 152/95  Pulse: (!) 111 88  70  Resp: 18 18  18   Temp: (!) 97.2 F (36.2 C) (!) 97.5 F (36.4 C)  97.9 F (36.6 C)  TempSrc: Oral Oral  Oral  SpO2: 92% 92%  92%  Weight:   84 kg     Intake/Output Summary (Last 24 hours) at 02/21/2020 1842 Last data filed at 02/21/2020 1300 Gross per 24 hour  Intake 240 ml  Output --  Net 240 ml   Filed Weights   02/21/20 1526  Weight: 84 kg    Examination:  General exam: Appears calm and comfortable  Respiratory system: Clear to auscultation. Respiratory effort normal. Cardiovascular system: S1 & S2 heard, RRR. No JVD,No pedal edema. Gastrointestinal system: Abdomen is nondistended, soft and nontender.  Normal bowel sounds heard. Central nervous system: Alert and oriented.  Unsteady gait, ataxia Extremities: Symmetric 5 x 5 power. Skin: No rashes, lesions or ulcers Psychiatry: Anxious, restless, slightly agitated    Data Reviewed: I have personally reviewed following labs and imaging studies  CBC: Recent Labs  Lab 02/20/20 1243 02/20/20 1305  WBC 6.4  --   NEUTROABS 4.2  --   HGB 15.9 16.3  HCT 47.5 48.0  MCV 94.8  --   PLT 247  --  Basic Metabolic Panel: Recent Labs  Lab 02/20/20 1243 02/20/20 1305  NA 137 141  K 3.8 3.9  CL 103 103  CO2 26  --   GLUCOSE 104* 102*  BUN 22 23  CREATININE 0.92 1.00  CALCIUM 8.9  --     GFR: Estimated Creatinine Clearance: 56 mL/min (by C-G formula based on SCr of 1 mg/dL).  Liver Function Tests: Recent Labs  Lab 02/20/20 1243  AST 21  ALT 15  ALKPHOS 68  BILITOT 0.9  PROT 7.4  ALBUMIN 4.5    CBG: No results for input(s): GLUCAP in the last 168 hours.   Recent Results (from the past 240 hour(s))  Resp Panel by RT-PCR (Flu A&B, Covid) Nasopharyngeal Swab     Status: None   Collection Time: 02/20/20  8:55 PM   Specimen:  Nasopharyngeal Swab; Nasopharyngeal(NP) swabs in vial transport medium  Result Value Ref Range Status   SARS Coronavirus 2 by RT PCR NEGATIVE NEGATIVE Final    Comment: (NOTE) SARS-CoV-2 target nucleic acids are NOT DETECTED.  The SARS-CoV-2 RNA is generally detectable in upper respiratory specimens during the acute phase of infection. The lowest concentration of SARS-CoV-2 viral copies this assay can detect is 138 copies/mL. A negative result does not preclude SARS-Cov-2 infection and should not be used as the sole basis for treatment or other patient management decisions. A negative result may occur with  improper specimen collection/handling, submission of specimen other than nasopharyngeal swab, presence of viral mutation(s) within the areas targeted by this assay, and inadequate number of viral copies(<138 copies/mL). A negative result must be combined with clinical observations, patient history, and epidemiological information. The expected result is Negative.  Fact Sheet for Patients:  EntrepreneurPulse.com.au  Fact Sheet for Healthcare Providers:  IncredibleEmployment.be  This test is no t yet approved or cleared by the [place] FDA and  has been authorized for detection and/or diagnosis of SARS-CoV-2 by FDA under an Emergency Use Authorization (EUA). This EUA will remain  in effect (meaning this test can be used) for the duration of the COVID-19 declaration under Section 564(b)(1) of the Act, 21 U.S.C.section 360bbb-3(b)(1), unless the authorization is terminated  or revoked sooner.       Influenza A by PCR NEGATIVE NEGATIVE Final   Influenza B by PCR NEGATIVE NEGATIVE Final    Comment: (NOTE) The Xpert Xpress SARS-CoV-2/FLU/RSV plus assay is intended as an aid in the diagnosis of influenza from Nasopharyngeal swab specimens and should not be used as a sole basis for treatment. Nasal washings and aspirates are unacceptable for  Xpert Xpress SARS-CoV-2/FLU/RSV testing.  Fact Sheet for Patients: EntrepreneurPulse.com.au  Fact Sheet for Healthcare Providers: IncredibleEmployment.be  This test is not yet approved or cleared by the [place] FDA and has been authorized for detection and/or diagnosis of SARS-CoV-2 by FDA under an Emergency Use Authorization (EUA). This EUA will remain in effect (meaning this test can be used) for the duration of the COVID-19 declaration under Section 564(b)(1) of the Act, 21 U.S.C. section 360bbb-3(b)(1), unless the authorization is terminated or revoked.  Performed at [facility], [place] [address], [place], [place] [address]          Radiology Studies: CT Code Stroke CTA Head W/WO contrast  Result Date: 02/21/2020 CLINICAL DATA:  Stroke workup EXAM: CT ANGIOGRAPHY HEAD AND NECK TECHNIQUE: Multidetector CT imaging of the head and neck was performed using the standard protocol during bolus administration of intravenous contrast. Multiplanar CT image reconstructions  and MIPs were obtained to evaluate the vascular anatomy. Carotid stenosis measurements (when applicable) are obtained utilizing NASCET criteria, using the distal internal carotid diameter as the denominator. CONTRAST:  25mL OMNIPAQUE IOHEXOL 350 MG/ML SOLN COMPARISON:  CT and brain MRI from yesterday. FINDINGS: CTA NECK FINDINGS Aortic arch: Atheromatous plaque.  Three vessel branching. Right carotid system: Scattered mainly calcified atheromatous plaque primarily at the bifurcation. No flow limiting stenosis, ulceration, or beading. Left carotid system: Calcified plaque primarily at the bifurcation/bulb with at least 70% stenosis as measured on sagittal reformats. No ulceration or beading. Vertebral arteries: Proximal subclavian atherosclerosis without flow limiting stenosis. The left vertebral artery is dominant. Vertebral arteries are smooth and widely patent to  the dura. Skeleton: Advanced, generalized disc and facet degeneration with C3-4 anterolisthesis. Partially covered left glenohumeral joint with intra-articular bodies in the medial recess. Other neck: No masslike or inflammatory finding seen. Upper chest: No acute finding Review of the MIP images confirms the above findings CTA HEAD FINDINGS Anterior circulation: Calcified plaque along the carotid siphons. No branch occlusion, beading, or proximal flow limiting stenosis. Negative for aneurysm. Posterior circulation: Left dominant vertebral artery. Patent bilateral PICA. Patent bilateral superior cerebellar arteries with symmetric enhancement. A faint right AICA is present. Advanced right P2 segment stenosis. High-grade left P3 segment stenosis. No beading or aneurysm. Venous sinuses: Unremarkable in the arterial phase Anatomic variants: None significant Review of the MIP images confirms the above findings IMPRESSION: 1. No emergent finding.  No evident cerebellar branch occlusion. 2. At least 70% left ICA origin stenosis. 3. High-grade right P2 and left P3 atheromatous stenoses. Electronically Signed   By: Monte Fantasia M.D.   On: 02/21/2020 08:13   CT HEAD WO CONTRAST  Result Date: 02/20/2020 CLINICAL DATA:  Slurred speech, blurred vision. EXAM: CT HEAD WITHOUT CONTRAST TECHNIQUE: Contiguous axial images were obtained from the base of the skull through the vertex without intravenous contrast. COMPARISON:  May 24, 2019. FINDINGS: Brain: Mild diffuse cortical atrophy is noted. Mild chronic ischemic white matter disease is noted. No mass effect or midline shift is noted. Ventricular size is within normal limits. There is no evidence of mass lesion or hemorrhage. Low density is noted in the region of the right cerebellar peduncle concerning for infarction of indeterminate age. Vascular: No hyperdense vessel or unexpected calcification. Skull: Normal. Negative for fracture or focal lesion. Sinuses/Orbits: No  acute finding. Other: None. IMPRESSION: Mild diffuse cortical atrophy. Mild chronic ischemic white matter disease. Low density is noted in the region of the right cerebellar peduncle concerning for infarction of indeterminate age. MRI is recommended for further evaluation. Electronically Signed   By: Marijo Conception M.D.   On: 02/20/2020 13:12   CT Code Stroke CTA Neck W/WO contrast  Result Date: 02/21/2020 CLINICAL DATA:  Stroke workup EXAM: CT ANGIOGRAPHY HEAD AND NECK TECHNIQUE: Multidetector CT imaging of the head and neck was performed using the standard protocol during bolus administration of intravenous contrast. Multiplanar CT image reconstructions and MIPs were obtained to evaluate the vascular anatomy. Carotid stenosis measurements (when applicable) are obtained utilizing NASCET criteria, using the distal internal carotid diameter as the denominator. CONTRAST:  93mL OMNIPAQUE IOHEXOL 350 MG/ML SOLN COMPARISON:  CT and brain MRI from yesterday. FINDINGS: CTA NECK FINDINGS Aortic arch: Atheromatous plaque.  Three vessel branching. Right carotid system: Scattered mainly calcified atheromatous plaque primarily at the bifurcation. No flow limiting stenosis, ulceration, or beading. Left carotid system: Calcified plaque primarily at the bifurcation/bulb with at least  70% stenosis as measured on sagittal reformats. No ulceration or beading. Vertebral arteries: Proximal subclavian atherosclerosis without flow limiting stenosis. The left vertebral artery is dominant. Vertebral arteries are smooth and widely patent to the dura. Skeleton: Advanced, generalized disc and facet degeneration with C3-4 anterolisthesis. Partially covered left glenohumeral joint with intra-articular bodies in the medial recess. Other neck: No masslike or inflammatory finding seen. Upper chest: No acute finding Review of the MIP images confirms the above findings CTA HEAD FINDINGS Anterior circulation: Calcified plaque along the carotid  siphons. No branch occlusion, beading, or proximal flow limiting stenosis. Negative for aneurysm. Posterior circulation: Left dominant vertebral artery. Patent bilateral PICA. Patent bilateral superior cerebellar arteries with symmetric enhancement. A faint right AICA is present. Advanced right P2 segment stenosis. High-grade left P3 segment stenosis. No beading or aneurysm. Venous sinuses: Unremarkable in the arterial phase Anatomic variants: None significant Review of the MIP images confirms the above findings IMPRESSION: 1. No emergent finding.  No evident cerebellar branch occlusion. 2. At least 70% left ICA origin stenosis. 3. High-grade right P2 and left P3 atheromatous stenoses. Electronically Signed   By: Monte Fantasia M.D.   On: 02/21/2020 08:13   MR BRAIN WO CONTRAST  Result Date: 02/20/2020 CLINICAL DATA:  Right-sided weakness, slurred speech, blurry vision EXAM: MRI HEAD WITHOUT CONTRAST TECHNIQUE: Multiplanar, multiecho pulse sequences of the brain and surrounding structures were obtained without intravenous contrast. COMPARISON:  05/24/2019 FINDINGS: Brain: There is area of mildly reduced diffusion within the right brachium pontis measuring approximately 1.4 cm. There is corresponding T2 hyperintensity with rounded appearance. Additional patchy and confluent areas of T2 hyperintensity in the supratentorial white matter are nonspecific but probably reflect moderate to advanced chronic microvascular ischemic changes. Small chronic right cerebellar infarct. There is no intracranial hemorrhage. There is no intracranial mass or significant mass effect. There is no hydrocephalus or extra-axial fluid collection. Prominence of the ventricles and sulci reflects generalized parenchymal volume loss similar to the prior study. Vascular: Major vessel flow voids at the skull base are preserved. Skull and upper cervical spine: Normal marrow signal is preserved. Sinuses/Orbits: Minor mucosal thickening.  Bilateral lens replacements. Other: Sella is unremarkable.  Mastoid air cells are clear. IMPRESSION: Area of abnormal signal within the right middle cerebellar peduncle may reflect acute to subacute infarct. Given rounded appearance, follow-up is recommended ensure appropriate evolution (suggest addition of post contrast imaging at that time). Moderate to advanced chronic microvascular ischemic changes. Small chronic right cerebellar infarct. Electronically Signed   By: Macy Mis M.D.   On: 02/20/2020 15:44   MR BRAIN W CONTRAST  Result Date: 02/20/2020 CLINICAL DATA:  Right middle cerebellar peduncle lesion. EXAM: MRI HEAD WITH CONTRAST TECHNIQUE: Multiplanar, multiecho pulse sequences of the brain and surrounding structures were obtained with intravenous contrast. CONTRAST:  40mL GADAVIST GADOBUTROL 1 MMOL/ML IV SOLN COMPARISON:  Brain MRI without contrast 02/20/2020 FINDINGS: There is no abnormal contrast enhancement at the site of the right middle cerebellar peduncle lesion. There is a punctate focus of contrast enhancement within the posterior right cerebellar hemisphere (series 5, image 43) that is favored to be vascular. IMPRESSION: 1. No abnormal contrast enhancement at the site of the right middle cerebellar peduncle lesion. 2. Punctate focus of enhancement in the posterior right cerebellar hemispheres favored to be vascular, but attention on follow-up scan in 6-12 weeks recommended. Electronically Signed   By: Ulyses Jarred M.D.   On: 02/20/2020 19:19   DG CHEST PORT 1 VIEW  Result Date:  02/20/2020 CLINICAL DATA:  CVA EXAM: PORTABLE CHEST 1 VIEW COMPARISON:  10/31/2003 FINDINGS: Single frontal view of the chest demonstrates mild enlargement the cardiac silhouette, accentuated by portable AP technique. No airspace disease, effusion, or pneumothorax. Significant bilateral shoulder osteoarthritis. No acute fracture. IMPRESSION: 1. Enlarged cardiac silhouette. 2. No acute airspace disease.  Electronically Signed   By: Randa Ngo M.D.   On: 02/20/2020 23:50   ECHOCARDIOGRAM COMPLETE  Result Date: 02/21/2020    ECHOCARDIOGRAM REPORT   Patient Name:   Ruben Barrera Date of Exam: 02/21/2020 Medical Rec #:  [id number]      Height:       67.0 in Accession #:    [id number]     Weight:       191.2 lb Date of Birth:  1932-04-06       BSA:          1.984 m Patient Age:    15 years       BP:           154/104 mmHg Patient Gender: M              HR:           97 bpm. Exam Location:  Inpatient Procedure: 2D Echo, Cardiac Doppler and Color Doppler Indications:    Stroke  History:        Patient has no prior history of Echocardiogram examinations.                 Stroke, Signs/Symptoms:Dyspnea; Risk Factors:Hypertension.  Sonographer:    Roseanna Rainbow RDCS Referring Phys: [place]  Sonographer Comments: Technically difficult study due to poor echo windows. Patient did not have IV access, and refused to get one. Definity not used. IMPRESSIONS  1. Left ventricular ejection fraction, by estimation, is 30 to 35%. The left ventricle has moderately decreased function. The left ventricle demonstrates global hypokinesis. The left ventricular internal cavity size was mildly dilated. There is severe concentric left ventricular hypertrophy. Left ventricular diastolic parameters are indeterminate.  2. Right ventricular systolic function is normal. The right ventricular size is normal.  3. Left atrial size was severely dilated.  4. The mitral valve is grossly normal. Mild mitral valve regurgitation.  5. The aortic valve is calcified. Aortic valve regurgitation is not visualized. Mild to moderate aortic valve stenosis. Aortic valve mean gradient measures 25.0 mmHg. Comparison(s): No prior Echocardiogram. Conclusion(s)/Recommendation(s): LV function is reduced. There is at least mild to moderate aortic stenosis; this may be underestimated in the setting of LV function. FINDINGS  Left Ventricle: LVMI 197 g/m2 RWT  0.54. Left ventricular ejection fraction, by estimation, is 30 to 35%. The left ventricle has moderately decreased function. The left ventricle demonstrates global hypokinesis. The left ventricular internal cavity size was mildly dilated. There is severe concentric left ventricular hypertrophy. Left ventricular diastolic parameters are indeterminate. Right Ventricle: The right ventricular size is normal. No increase in right ventricular wall thickness. Right ventricular systolic function is normal. Left Atrium: Left atrial size was severely dilated. Right Atrium: Right atrial size was normal in size. Pericardium: There is no evidence of pericardial effusion. Mitral Valve: The mitral valve is grossly normal. There is mild thickening of the mitral valve leaflet(s). Mild to moderate mitral annular calcification. Mild mitral valve regurgitation. Tricuspid Valve: The tricuspid valve is grossly normal. Tricuspid valve regurgitation is not demonstrated. Aortic Valve: LV SVI 38. The aortic valve is calcified. Aortic valve regurgitation is not visualized. Mild to moderate  aortic stenosis is present. Aortic valve mean gradient measures 25.0 mmHg. Aortic valve peak gradient measures 39.1 mmHg. Aortic valve area, by VTI measures 1.32 cm. Pulmonic Valve: The pulmonic valve was grossly normal. Pulmonic valve regurgitation is not visualized. Aorta: The aortic root is normal in size and structure. Venous: The pulmonary veins were not well visualized. The inferior vena cava was not well visualized. IAS/Shunts: The atrial septum is grossly normal.  LEFT VENTRICLE PLAX 2D LVIDd:         5.90 cm      Diastology LVIDs:         5.30 cm      LV e' medial:    3.99 cm/s LV PW:         1.60 cm      LV E/e' medial:  20.7 LV IVS:        1.30 cm      LV e' lateral:   4.12 cm/s LVOT diam:     2.45 cm      LV E/e' lateral: 20.0 LV SV:         80 LV SV Index:   40 LVOT Area:     4.71 cm  LV Volumes (MOD) LV vol d, MOD A2C: 165.0 ml LV vol d, MOD  A4C: 119.5 ml LV vol s, MOD A2C: 111.0 ml LV vol s, MOD A4C: 76.0 ml LV SV MOD A2C:     54.0 ml LV SV MOD A4C:     119.5 ml LV SV MOD BP:      48.8 ml RIGHT VENTRICLE             IVC RV S prime:     18.00 cm/s  IVC diam: 3.10 cm TAPSE (M-mode): 2.3 cm LEFT ATRIUM             Index       RIGHT ATRIUM           Index LA diam:        4.20 cm 2.12 cm/m  RA Area:     12.20 cm LA Vol (A2C):   70.5 ml 35.54 ml/m RA Volume:   24.90 ml  12.55 ml/m LA Vol (A4C):   94.0 ml 47.38 ml/m LA Biplane Vol: 85.4 ml 43.05 ml/m  AORTIC VALVE AV Area (Vmax):    1.22 cm AV Area (Vmean):   1.08 cm AV Area (VTI):     1.32 cm AV Vmax:           312.75 cm/s AV Vmean:          236.500 cm/s AV VTI:            0.608 m AV Peak Grad:      39.1 mmHg AV Mean Grad:      25.0 mmHg LVOT Vmax:         80.90 cm/s LVOT Vmean:        54.400 cm/s LVOT VTI:          0.170 m LVOT/AV VTI ratio: 0.28  AORTA Ao Root diam: 3.20 cm Ao Asc diam:  3.10 cm MITRAL VALVE MV Area (PHT): 4.97 cm    SHUNTS MV Decel Time: 153 msec    Systemic VTI:  0.17 m MV E velocity: 82.53 cm/s  Systemic Diam: 2.45 cm Rudean Haskell MD Electronically signed by Rudean Haskell MD Signature Date/Time: 02/21/2020/4:33:50 PM    Final         Scheduled Meds: . Derrill Memo ON 02/22/2020]  aspirin EC  81 mg Oral Daily  . citalopram  10 mg Oral Daily  . ezetimibe  10 mg Oral QPM  . gabapentin  600 mg Oral TID  . pantoprazole  40 mg Oral Daily  . tamsulosin  0.4 mg Oral Daily   Continuous Infusions: . sodium chloride 75 mL/hr at 02/21/20 0429     LOS: 1 day        Hosie Poisson, MD [facility]   To contact the attending provider between 7A-7P or the covering provider during after hours 7P-7A, please log into the web site [email or web address] and access using universal [place] password for that web site. If you do not have the password, please call the hospital operator.  02/21/2020, 6:42 PM

## 2020-02-21 NOTE — Progress Notes (Signed)
Occupational Therapy Evaluation Patient Details Name: Ruben Barrera MRN: [id number] DOB: [birth year] Today's Date: 02/21/2020    History of Present Illness Pt is an 86 year old male who presented with multiple intermittent bouts of slurred speech, confusion, and blurred/double vision since 02/16/20. He has also been displaying gait abnormalities since October. CT and MRI of head revealed possible acute or subacute R middle cerebellar peduncle infarct. NIHSS 0-2. PMH: lumbar spinal stenosis, HTN, dysrhythmia, and arthritis.    Clinical Impression   PTA patient was independent with BADLs/IADLs with use of SPC and was living in a private residence with his spouse. Patient reports driving and exercising 5x weekly. Patient currently presents with deficits in safety awareness, LUE coordination, static/dynamic standing balance, attention, awareness, and increased need for assistance with ADLs, ADL transfers, and functional mobility. Patient also very impulsive with low frustration tolerance during OT eval threatening to leave the hospital AMA multiple times. RN aware. Patient would benefit from continued acute OT services to maximize safety and independence with ADLs/IADLs. Recommendation for CIR placement prior to d/c home with wife [name] who is able to provide 24hr supervision/assist.     Follow Up Recommendations  CIR    Equipment Recommendations  None recommended by OT    Recommendations for Other Services Rehab consult     Precautions / Restrictions Precautions Precautions: Fall Restrictions Weight Bearing Restrictions: No      Mobility Bed Mobility Overal bed mobility: Needs Assistance Bed Mobility: Supine to Sit;Sit to Supine     Supine to sit: Supervision Sit to supine: Supervision   General bed mobility comments: Pt able to transition supine <> sit with quick movements with bed flat and no use of bed rails, supervision for safety.    Transfers Overall transfer level: Needs  assistance Equipment used: Straight cane Transfers: Sit to/from Stand Sit to Stand: Min assist Stand pivot transfers: Min guard;Min assist       General transfer comment: Pt impulsive in coming to stand when directed to remain seated, minA for steadying dur to trunk sway.    Balance Overall balance assessment: Needs assistance Sitting-balance support: Feet supported Sitting balance-Leahy Scale: Good Sitting balance - Comments: Pt able to reach off BOS to reach feet without LOB and min guard for safety.   Standing balance support: Single extremity supported;During functional activity Standing balance-Leahy Scale: Poor Standing balance comment: 1 UE support on cane with mobility and min guard-modA due to bouts of LOB.                           ADL either performed or assessed with clinical judgement   ADL Overall ADL's : Needs assistance/impaired     Grooming: Wash/dry hands;Oral care;Min guard;Standing Grooming Details (indicate cue type and reason): Patient with difficulty organizing and sequencing oral hygiene and hand washing tasks requiring verbal cues. Patient very impulsive with low frustration tolerance. Min guard to steady without use of AD.              Lower Body Dressing: Min guard;Sit to/from stand Lower Body Dressing Details (indicate cue type and reason): Supervision A to doff/don footwear and Min guard to don LB clothing in sitting/standing for safety 2/2 impulsivity and decreased safety awareness.  Toilet Transfer: Magazine features editor Details (indicate cue type and reason): Min guard for safety 2/2 impulsivity and decreased safety awareness.          Functional mobility during ADLs: Min guard General  ADL Comments: Min guard with short-distance ambulation in room 2/2 decreased safety awareness and impulsivity.      Vision Baseline Vision/History: Wears glasses Wears Glasses: At all times Patient Visual Report: Diplopia (Has since  resolved) Vision Assessment?:  (Declined visual assessment)     Perception     Praxis Praxis Praxis tested?: Deficits Deficits: Organization    Pertinent Vitals/Pain Pain Assessment: No/denies pain     Hand Dominance Right   Extremity/Trunk Assessment Upper Extremity Assessment Upper Extremity Assessment: Defer to OT evaluation LUE Sensation: WNL LUE Coordination: decreased fine motor;decreased gross motor   Lower Extremity Assessment Lower Extremity Assessment: RLE deficits/detail;LLE deficits/detail RLE Deficits / Details: MMT score of 4+ to 5 grossly throughout RLE Sensation: WNL (light touch and dynamic proprio intact) RLE Coordination: decreased fine motor;decreased gross motor (dysmetria and dysdiadochokinesia noted) LLE Deficits / Details: MMT score of 4+ to 5 grossly throughout LLE Sensation: WNL (light touch and dynamic proprio intact) LLE Coordination: WNL   Cervical / Trunk Assessment Cervical / Trunk Assessment: Normal   Communication Communication Communication: Expressive difficulties   Cognition Arousal/Alertness: Awake/alert Behavior During Therapy: Agitated;Restless;Impulsive Overall Cognitive Status: Impaired/Different from baseline Area of Impairment: Attention;Memory;Safety/judgement;Awareness;Following commands;Problem solving                   Current Attention Level: Selective Memory: Decreased short-term memory Following Commands: Follows one step commands inconsistently;Follows one step commands with increased time Safety/Judgement: Decreased awareness of safety;Decreased awareness of deficits Awareness: Emergent Problem Solving: Slow processing;Difficulty sequencing;Requires verbal cues;Requires tactile cues General Comments: Pt impulsive and requires extensive cues to maintain safety through providing single step commands to stop regularly and listen to directions. Pt agitated and refuses to follow cues to not get out of chair without  assistance. LOB and tripping on R foot during session with pt in denial of this placing him at risk for falls and injury. A&Ox4. Wife states that her husband is not normally this agitated and resistant or unsafe.   General Comments  BP 152/100 sitting in recliner start of session    Exercises     Shoulder Instructions      Home Living Family/patient expects to be discharged to:: Private residence Living Arrangements: Spouse/significant other Available Help at Discharge: Available 24 hours/day Type of Home: House Home Access: Stairs to enter [employer] of Steps: 3 Entrance Stairs-Rails: Right (Ascending) Home Layout: One level     Bathroom Shower/Tub: Walk-in shower;Door   [employer] Toilet: Standard Bathroom Accessibility: Yes   Home Equipment: Other (comment);Wheelchair - manual;Hand held shower head;Grab bars - tub/shower;Walker - 2 wheels (Hurry cane, 3-wheeled walker w/o seat)          Prior Functioning/Environment Level of Independence: Independent with assistive device(s)        Comments: Independent with all mobility with use of hurrycane in R hand and int 3-wheeled walker in morning. Patient was still driving.         OT Problem List: Decreased strength;Impaired balance (sitting and/or standing);Decreased coordination;Decreased cognition;Decreased safety awareness;Decreased knowledge of precautions      OT Treatment/Interventions: Self-care/ADL training;Therapeutic exercise;Neuromuscular education;DME and/or AE instruction;Therapeutic activities;Cognitive remediation/compensation;Patient/family education;Balance training    OT Goals(Current goals can be found in the care plan section) Acute Rehab OT Goals Patient Stated Goal: to go home and get back surgery OT Goal Formulation: With patient Time For Goal Achievement: 03/06/20 Potential to Achieve Goals: Good ADL Goals Pt Will Transfer to Toilet: with modified independence;ambulating Pt Will  Perform Toileting - Clothing Manipulation  and hygiene: with modified independence;sit to/from stand Additional ADL Goal #1: Patient will demonstrate ability for day to day recall/carry over during ADLs with independence. Additional ADL Goal #2: Patient will sequence 3/3 grooming tasks standing at sink level without cueing demonstrating increased organization.  OT Frequency: Min 2X/week   Barriers to D/C: Inaccessible home environment          Co-evaluation              AM-PAC OT "6 Clicks" Daily Activity     Outcome Measure Help from another person eating meals?: None Help from another person taking care of personal grooming?: A Little Help from another person toileting, which includes using toliet, bedpan, or urinal?: A Little Help from another person bathing (including washing, rinsing, drying)?: A Little Help from another person to put on and taking off regular upper body clothing?: A Little Help from another person to put on and taking off regular lower body clothing?: A Little 6 Click Score: 19   End of Session Equipment Utilized During Treatment: Gait belt Nurse Communication: Mobility status  Activity Tolerance: Treatment limited secondary to agitation Patient left: in chair;with call bell/phone within reach;with chair alarm set;with family/visitor present  OT Visit Diagnosis: Unsteadiness on feet (R26.81);Muscle weakness (generalized) (M62.81);Apraxia (R48.2)                Time: 1610-9604 OT Time Calculation (min): 36 min Charges:  OT General Charges $OT Visit: 1 Visit OT Evaluation $OT Eval Moderate Complexity: 1 Mod OT Treatments $Self Care/Home Management : 8-22 mins  Katianne Barre H. OTR/L Supplemental OT, Department of rehab services [phone]  Shron Ozer R H. 02/21/2020, 10:45 AM

## 2020-02-22 ENCOUNTER — Encounter (HOSPITAL_COMMUNITY): Payer: Self-pay | Admitting: Internal Medicine

## 2020-02-22 ENCOUNTER — Other Ambulatory Visit: Payer: Self-pay

## 2020-02-22 ENCOUNTER — Encounter (HOSPITAL_COMMUNITY): Payer: Self-pay | Admitting: Physical Medicine & Rehabilitation

## 2020-02-22 ENCOUNTER — Inpatient Hospital Stay (HOSPITAL_COMMUNITY)
Admission: RE | Admit: 2020-02-22 | Discharge: 2020-02-27 | DRG: 057 | Disposition: A | Discharge status: Home / Self Care | Payer: PPO | Source: Intra-hospital | Attending: Physical Medicine & Rehabilitation | Admitting: Physical Medicine & Rehabilitation

## 2020-02-22 ENCOUNTER — Other Ambulatory Visit: Payer: Self-pay | Admitting: Cardiology

## 2020-02-22 ENCOUNTER — Telehealth: Payer: Self-pay | Admitting: Neurology

## 2020-02-22 DIAGNOSIS — Z96653 Presence of artificial knee joint, bilateral: Secondary | ICD-10-CM | POA: Diagnosis present

## 2020-02-22 DIAGNOSIS — I63541 Cerebral infarction due to unspecified occlusion or stenosis of right cerebellar artery: Secondary | ICD-10-CM | POA: Diagnosis not present

## 2020-02-22 DIAGNOSIS — I69322 Dysarthria following cerebral infarction: Principal | ICD-10-CM

## 2020-02-22 DIAGNOSIS — I110 Hypertensive heart disease with heart failure: Secondary | ICD-10-CM | POA: Diagnosis not present

## 2020-02-22 DIAGNOSIS — I10 Essential (primary) hypertension: Secondary | ICD-10-CM | POA: Diagnosis not present

## 2020-02-22 DIAGNOSIS — F17290 Nicotine dependence, other tobacco product, uncomplicated: Secondary | ICD-10-CM | POA: Diagnosis present

## 2020-02-22 DIAGNOSIS — K5900 Constipation, unspecified: Secondary | ICD-10-CM | POA: Diagnosis not present

## 2020-02-22 DIAGNOSIS — I429 Cardiomyopathy, unspecified: Secondary | ICD-10-CM | POA: Diagnosis not present

## 2020-02-22 DIAGNOSIS — I5022 Chronic systolic (congestive) heart failure: Secondary | ICD-10-CM | POA: Diagnosis not present

## 2020-02-22 DIAGNOSIS — Z79899 Other long term (current) drug therapy: Secondary | ICD-10-CM | POA: Diagnosis not present

## 2020-02-22 DIAGNOSIS — I69320 Aphasia following cerebral infarction: Secondary | ICD-10-CM

## 2020-02-22 DIAGNOSIS — I63529 Cerebral infarction due to unspecified occlusion or stenosis of unspecified anterior cerebral artery: Secondary | ICD-10-CM | POA: Insufficient documentation

## 2020-02-22 DIAGNOSIS — N400 Enlarged prostate without lower urinary tract symptoms: Secondary | ICD-10-CM | POA: Diagnosis not present

## 2020-02-22 DIAGNOSIS — E7800 Pure hypercholesterolemia, unspecified: Secondary | ICD-10-CM | POA: Diagnosis present

## 2020-02-22 DIAGNOSIS — R278 Other lack of coordination: Secondary | ICD-10-CM | POA: Diagnosis not present

## 2020-02-22 DIAGNOSIS — I420 Dilated cardiomyopathy: Secondary | ICD-10-CM

## 2020-02-22 DIAGNOSIS — R471 Dysarthria and anarthria: Secondary | ICD-10-CM | POA: Diagnosis not present

## 2020-02-22 DIAGNOSIS — I350 Nonrheumatic aortic (valve) stenosis: Secondary | ICD-10-CM

## 2020-02-22 DIAGNOSIS — I493 Ventricular premature depolarization: Secondary | ICD-10-CM

## 2020-02-22 DIAGNOSIS — E785 Hyperlipidemia, unspecified: Secondary | ICD-10-CM | POA: Diagnosis present

## 2020-02-22 DIAGNOSIS — G2581 Restless legs syndrome: Secondary | ICD-10-CM | POA: Diagnosis not present

## 2020-02-22 DIAGNOSIS — G8929 Other chronic pain: Secondary | ICD-10-CM | POA: Diagnosis present

## 2020-02-22 DIAGNOSIS — K219 Gastro-esophageal reflux disease without esophagitis: Secondary | ICD-10-CM | POA: Diagnosis present

## 2020-02-22 MED ORDER — EZETIMIBE 10 MG PO TABS
10.0000 mg | ORAL_TABLET | Freq: Every evening | ORAL | Status: DC
  Administered 2020-02-22 – 2020-02-26 (×5): 10 mg via ORAL
  Filled 2020-02-22 (×5): qty 1

## 2020-02-22 MED ORDER — LOSARTAN POTASSIUM 50 MG PO TABS: 25.0000 mg | ORAL_TABLET | Freq: Every day | ORAL | Status: DC

## 2020-02-22 MED ORDER — CLOPIDOGREL BISULFATE 75 MG PO TABS
75.0000 mg | ORAL_TABLET | Freq: Every day | ORAL | Status: DC
  Administered 2020-02-23 – 2020-02-27 (×5): 75 mg via ORAL
  Filled 2020-02-22 (×5): qty 1

## 2020-02-22 MED ORDER — PRAVASTATIN SODIUM 20 MG PO TABS
20.0000 mg | ORAL_TABLET | Freq: Every day | ORAL | Status: DC
Start: 2020-02-22 — End: 2020-02-26

## 2020-02-22 MED ORDER — ACETAMINOPHEN 325 MG PO TABS
650.0000 mg | ORAL_TABLET | ORAL | Status: DC | PRN
  Administered 2020-02-25: 650 mg via ORAL
  Filled 2020-02-22 (×2): qty 2

## 2020-02-22 MED ORDER — PANTOPRAZOLE SODIUM 40 MG PO TBEC
40.0000 mg | DELAYED_RELEASE_TABLET | Freq: Every day | ORAL | Status: DC
  Administered 2020-02-23 – 2020-02-27 (×5): 40 mg via ORAL
  Filled 2020-02-22 (×5): qty 1

## 2020-02-22 MED ORDER — CLOPIDOGREL BISULFATE 75 MG PO TABS: 75.0000 mg | ORAL_TABLET | Freq: Every day | ORAL | 0 refills | Status: DC

## 2020-02-22 MED ORDER — CLOPIDOGREL BISULFATE 75 MG PO TABS
75.0000 mg | ORAL_TABLET | Freq: Every day | ORAL | Status: DC
  Administered 2020-02-22: 75 mg via ORAL
  Filled 2020-02-22: qty 1

## 2020-02-22 MED ORDER — METOPROLOL SUCCINATE ER 25 MG PO TB24
12.5000 mg | ORAL_TABLET | Freq: Every day | ORAL | Status: DC
  Administered 2020-02-22 – 2020-02-27 (×6): 12.5 mg via ORAL
  Filled 2020-02-22 (×6): qty 1

## 2020-02-22 MED ORDER — PRAVASTATIN SODIUM 10 MG PO TABS
20.0000 mg | ORAL_TABLET | Freq: Every day | ORAL | Status: DC
  Administered 2020-02-22 – 2020-02-26 (×5): 20 mg via ORAL
  Filled 2020-02-22 (×5): qty 2

## 2020-02-22 MED ORDER — SENNOSIDES-DOCUSATE SODIUM 8.6-50 MG PO TABS
1.0000 | ORAL_TABLET | Freq: Two times a day (BID) | ORAL | Status: DC
  Administered 2020-02-22 – 2020-02-27 (×10): 1 via ORAL
  Filled 2020-02-22 (×10): qty 1

## 2020-02-22 MED ORDER — LOSARTAN POTASSIUM 25 MG PO TABS
12.5000 mg | ORAL_TABLET | Freq: Every day | ORAL | Status: DC
  Administered 2020-02-22 – 2020-02-27 (×6): 12.5 mg via ORAL
  Filled 2020-02-22 (×6): qty 0.5

## 2020-02-22 MED ORDER — GABAPENTIN 600 MG PO TABS
600.0000 mg | ORAL_TABLET | Freq: Three times a day (TID) | ORAL | Status: DC
  Administered 2020-02-22 – 2020-02-27 (×14): 600 mg via ORAL
  Filled 2020-02-22 (×15): qty 1

## 2020-02-22 MED ORDER — TAMSULOSIN HCL 0.4 MG PO CAPS
0.4000 mg | ORAL_CAPSULE | Freq: Every day | ORAL | Status: DC
  Administered 2020-02-23 – 2020-02-27 (×5): 0.4 mg via ORAL
  Filled 2020-02-22 (×5): qty 1

## 2020-02-22 MED ORDER — ACETAMINOPHEN 160 MG/5ML PO SOLN: 650.0000 mg | ORAL | Status: DC | PRN

## 2020-02-22 MED ORDER — HYDROCODONE-ACETAMINOPHEN 7.5-325 MG PO TABS
1.0000 | ORAL_TABLET | Freq: Three times a day (TID) | ORAL | Status: DC | PRN
  Administered 2020-02-23 – 2020-02-26 (×7): 1 via ORAL
  Filled 2020-02-22 (×9): qty 1

## 2020-02-22 MED ORDER — LORAZEPAM 1 MG PO TABS
1.0000 mg | ORAL_TABLET | Freq: Three times a day (TID) | ORAL | Status: DC | PRN
  Administered 2020-02-22 – 2020-02-26 (×5): 1 mg via ORAL
  Filled 2020-02-22 (×5): qty 1

## 2020-02-22 MED ORDER — ASPIRIN EC 81 MG PO TBEC
81.0000 mg | DELAYED_RELEASE_TABLET | Freq: Every day | ORAL | Status: DC
  Administered 2020-02-23 – 2020-02-27 (×5): 81 mg via ORAL
  Filled 2020-02-22 (×5): qty 1

## 2020-02-22 MED ORDER — CITALOPRAM HYDROBROMIDE 10 MG PO TABS
10.0000 mg | ORAL_TABLET | Freq: Every day | ORAL | Status: DC
  Administered 2020-02-23 – 2020-02-27 (×5): 10 mg via ORAL
  Filled 2020-02-22 (×5): qty 1

## 2020-02-22 MED ORDER — ACETAMINOPHEN 650 MG RE SUPP: 650.0000 mg | RECTAL | Status: DC | PRN

## 2020-02-22 NOTE — H&P (Signed)
Physical Medicine and Rehabilitation Admission H&P    Chief Complaint  Patient presents with  . Blurred Vision  . Aphasia  : HPI: Ardon Franklin is an 86 year old right-handed male with history of hypertension, chronic back pain with history of lumbar decompression maintained on Celebrex as well as Neurontin with baclofen and hydrocodone as needed, hyperlipidemia and tobacco abuse.  Per chart review patient lives with spouse 1 level home 3 steps to entry.  Independent with assistive device and still driving.  Presented 02/20/2020 with dysarthria and left-sided weakness.  CT of the head showed mild diffuse cortical atrophy.  Low density noted in the region of the right cerebellar peduncle concerning for infarction of indeterminate age.  Patient did not receive TPA.  MRI showed area of abnormal signal within the right middle cerebellar peduncle.  CT angiogram of head and neck no emergent findings no evidence of cerebellar branch occlusion.  At least 70% left ICA origin stenosis.  Echocardiogram with ejection fraction of 30 to 35%.  Noted severe concentric left ventricular hypertrophy.  Currently maintained on aspirin and Plavix for CVA prophylaxis x3 weeks then aspirin alone.  Tolerating a regular diet.  Therapy evaluations completed and patient was admitted for a comprehensive rehab program.   Pt reports has chronic back pain- was supposed to get [facility] stimulator testing done in the next 1 month- for chronic back pain.  Has spasms in back- also feels like having difficulties coming up with words- that's new since CVA. Wants SLP when brought to CIR.  LBM was yesterday- denies constipation.  Voiding well.  No dysphagia.   Review of Systems  Constitutional: Positive for malaise/fatigue. Negative for fever.  HENT: Negative for hearing loss.   Eyes: Negative for blurred vision and double vision.  Respiratory: Negative for cough.        Shortness of breath with increased exertion  Cardiovascular:  Positive for leg swelling. Negative for chest pain and palpitations.  Gastrointestinal: Positive for constipation. Negative for heartburn, nausea and vomiting.       GERD  Genitourinary: Positive for urgency. Negative for dysuria and hematuria.  Musculoskeletal: Positive for back pain and myalgias.  Skin: Negative for rash.  Neurological: Positive for focal weakness.       Restless leg syndrome  Psychiatric/Behavioral: Positive for depression.  All other systems reviewed and are negative.  Past Medical History:  Diagnosis Date  . Arthritis   . Complication of anesthesia    "had the shivers" after surgery  . Depression   . Dysrhythmia    "extra heart beat"  . GERD (gastroesophageal reflux disease)   . History of hiatal hernia   . Hypercholesteremia   . Hypertension   . Lumbar spinal stenosis   . RLS (restless legs syndrome)    takes ativan as needed   Past Surgical History:  Procedure Laterality Date  . BACK SURGERY    . LUMBAR LAMINECTOMY/DECOMPRESSION MICRODISCECTOMY N/A 08/28/2015   Procedure: Lumbar Four-Five decompressive lumbar laminectomy;  Surgeon: Jovita Gamma, MD;  Location: [place];  Service: Neurosurgery;  Laterality: N/A;  . REPLACEMENT TOTAL KNEE BILATERAL Bilateral 11/05/2003  . TOE AMPUTATION Right 2010   second toe  . TONSILLECTOMY     Family History  Problem Relation Age of Onset  . Stroke Mother   . Hypertension Other        family history   Social History:  reports that he has been smoking cigars. He has never used smokeless tobacco. He reports  current alcohol use. He reports that he does not use drugs. Allergies:  Allergies  Allergen Reactions  . Simvastatin Other (See Comments)    Leg pains   . Other Other (See Comments)    UNSPECIFIED REACTION  Reaction to anesthesia per patient   Medications Prior to Admission  Medication Sig Dispense Refill  . amLODipine (NORVASC) 2.5 MG tablet Take 2.5 mg by mouth every evening.     . baclofen  (LIORESAL) 10 MG tablet Take 10 mg by mouth 3 (three) times daily as needed for muscle spasms.     . celecoxib (CELEBREX) 200 MG capsule Take 200 mg by mouth daily.     . citalopram (CELEXA) 10 MG tablet Take 10 mg by mouth daily.    Marland Kitchen ezetimibe (ZETIA) 10 MG tablet Take 10 mg by mouth every evening.     . fluticasone (FLONASE) 50 MCG/ACT nasal spray Place 1 spray into both nostrils daily as needed for allergies.     Marland Kitchen gabapentin (NEURONTIN) 400 MG capsule Take 400 mg by mouth daily.    Marland Kitchen gabapentin (NEURONTIN) 600 MG tablet Take 600 mg by mouth 3 (three) times daily.    Marland Kitchen HYDROcodone-acetaminophen (NORCO) 7.5-325 MG tablet Take 1 tablet by mouth every 8 (eight) hours as needed for moderate pain.    Marland Kitchen LORazepam (ATIVAN) 1 MG tablet Take 1 mg by mouth daily.     . MULTIPLE VITAMIN PO Take 1 tablet by mouth daily.     Marland Kitchen omeprazole (PRILOSEC) 20 MG capsule Take 20 mg by mouth daily.    . tamsulosin (FLOMAX) 0.4 MG CAPS capsule Take 0.4 mg by mouth daily.     Marland Kitchen HYDROcodone-acetaminophen (NORCO/VICODIN) 5-325 MG tablet Take 1-2 tablets by mouth every 4 (four) hours as needed (mild pain). (Patient not taking: Reported on 02/20/2020) 60 tablet 0  . HYDROcodone-acetaminophen (NORCO/VICODIN) 5-325 MG tablet Take 1-2 tablets by mouth every 4 (four) hours as needed (pain). (Patient not taking: Reported on 02/20/2020) 60 tablet 0    Drug Regimen Review Drug regimen was reviewed and remains appropriate with no significant issues identified  Home: Home Living Family/patient expects to be discharged to:: Private residence Living Arrangements: Spouse/significant other Available Help at Discharge: Available 24 hours/day Type of Home: House Home Access: Stairs to enter [employer] of Steps: 3 Entrance Stairs-Rails: Right (Ascending) Home Layout: One level Bathroom Shower/Tub: Gaffer, Door [employer] Toilet: Standard Bathroom Accessibility: Yes Home Equipment: Other (comment),  Wheelchair - manual, Hand held shower head, Grab bars - tub/shower, Walker - 2 wheels (Hurry cane, 3-wheeled walker w/o seat)   Functional History: Prior Function Level of Independence: Independent with assistive device(s) Comments: Independent with all mobility with use of hurrycane in R hand and int 3-wheeled walker in morning. Patient was still driving.   Functional Status:  Mobility: Bed Mobility Overal bed mobility: Needs Assistance Bed Mobility: Supine to Sit, Sit to Supine Supine to sit: Supervision Sit to supine: Supervision General bed mobility comments: Pt able to transition supine <> sit with quick movements with bed flat and no use of bed rails, supervision for safety. Transfers Overall transfer level: Needs assistance Equipment used: Straight cane Transfers: Sit to/from Stand Sit to Stand: Min assist Stand pivot transfers: Min guard, Min assist General transfer comment: Pt impulsive in coming to stand when directed to remain seated, minA for steadying dur to trunk sway. Ambulation/Gait Ambulation/Gait assistance: Mod assist, Min assist Gait Distance (Feet): 100 Feet Assistive device: Straight cane Gait Pattern/deviations: Step-through  pattern, Decreased stride length, Narrow base of support General Gait Details: Pt ambulates with narrow BOS and intermittent L toe drag resulting in bouts of LOB and therefore min-modA to recover his balance during LOB bouts. Pt does not utilize [facility] apporpriately with gait as he sometimes carries it. Gait velocity: decreased Gait velocity interpretation: 1.31 - 2.62 ft/sec, indicative of limited community ambulator Stairs: Yes Stairs assistance: Min assist, Mod assist Stair Management: One rail Right, One rail Left, Alternating pattern Number of Stairs: 4 General stair comments: Ascending with use of R hand rail and descending with L, requiring repeated cues for proper hand placement to simulate home. Displays sequencing and coordination  deficits with poor R foot clearance, placing him at risk for falls and him requiring min-modA to maintain his safety. Several mini-tripping bouts of stairs due to poor foot clearance. Extensive cues provided to improve deficits, with no success.    ADL: ADL Overall ADL's : Needs assistance/impaired Grooming: Wash/dry hands, Oral care, Min guard, Standing Grooming Details (indicate cue type and reason): Patient with difficulty organizing and sequencing oral hygiene and hand washing tasks requiring verbal cues. Patient very impulsive with low frustration tolerance. Min guard to steady without use of AD.  Lower Body Dressing: Min guard, Sit to/from stand Lower Body Dressing Details (indicate cue type and reason): Supervision A to doff/don footwear and Min guard to don LB clothing in sitting/standing for safety 2/2 impulsivity and decreased safety awareness.  Toilet Transfer: Magazine features editor Details (indicate cue type and reason): Min guard for safety 2/2 impulsivity and decreased safety awareness.  Functional mobility during ADLs: Min guard General ADL Comments: Min guard with short-distance ambulation in room 2/2 decreased safety awareness and impulsivity.   Cognition: Cognition Overall Cognitive Status: Impaired/Different from baseline Orientation Level: Oriented to person, Oriented to place, Oriented to time Cognition Arousal/Alertness: Awake/alert Behavior During Therapy: Agitated, Restless, Impulsive Overall Cognitive Status: Impaired/Different from baseline Area of Impairment: Attention, Memory, Safety/judgement, Awareness, Following commands, Problem solving Current Attention Level: Selective Memory: Decreased short-term memory Following Commands: Follows one step commands inconsistently, Follows one step commands with increased time Safety/Judgement: Decreased awareness of safety, Decreased awareness of deficits Awareness: Emergent Problem Solving: Slow processing,  Difficulty sequencing, Requires verbal cues, Requires tactile cues General Comments: Pt impulsive and requires extensive cues to maintain safety through providing single step commands to stop regularly and listen to directions. Pt agitated and refuses to follow cues to not get out of chair without assistance. LOB and tripping on R foot during session with pt in denial of this placing him at risk for falls and injury. A&Ox4. Wife states that her husband is not normally this agitated and resistant or unsafe.  Physical Exam: Blood pressure (!) 152/86, pulse 88, temperature (!) 97.3 F (36.3 C), temperature source Oral, resp. rate 17, weight 84 kg, SpO2 91 %. Physical Exam Vitals and nursing note reviewed. Exam conducted with a chaperone present.  Constitutional:      Comments: Pt sitting up casually in bedside chair, wife at bedside, wearing eyeglasses, NAD- was shifting some due to back pain.   HENT:     Head: Normocephalic and atraumatic.     Comments: Smile equal; tongue midline to a little R deviation    Right Ear: External ear normal.     Left Ear: External ear normal.     Nose: Nose normal. No congestion or rhinorrhea.     Mouth/Throat:     Mouth: Mucous membranes are moist.  Pharynx: Oropharynx is clear. No oropharyngeal exudate.  Eyes:     General:        Right eye: No discharge.        Left eye: No discharge.     Extraocular Movements: Extraocular movements intact.     Comments: No nystagmus seen  Cardiovascular:     Comments: RRR- no JVD Pulmonary:     Comments: CTA B/L- no W/R/R- good air movement Abdominal:     Comments: Soft, NT, ND, (+)BS   Musculoskeletal:     Cervical back: Normal range of motion. No rigidity.     Comments: UEs 5/5 in deltoids, biceps, triceps, WE, grip and finger abd B/L- maybe slightly weak in L finger abduction? LEs- 5/5 in HF, KE, KF, DF and PF B/L  Skin:    Comments: Sun spots on UEs >LEs- don't see an IV- no skin breakdown on buttocks/heels   Neurological:     Comments: Patient is alert in no acute distress makes good eye contact with examiner and follows commands.  He is oriented x3. Ox3- light touch intact x 4 extremities Had some nonfluent speech occ- mild- appeared to be word searching occ.   Psychiatric:     Comments: Insists will stay in CIR 4-5 days- insistent     Results for orders placed or performed during the hospital encounter of 02/20/20 (from the past 48 hour(s))  Ethanol     Status: None   Collection Time: 02/20/20 12:43 PM  Result Value Ref Range   Alcohol, Ethyl (B) <10 <10 mg/dL    Comment: (NOTE) Lowest detectable limit for serum alcohol is 10 mg/dL.  For medical purposes only. Performed at [facility], [place] [address], [place], [place] [address]   Protime-INR     Status: None   Collection Time: 02/20/20 12:43 PM  Result Value Ref Range   Prothrombin Time 13.8 11.4 - 15.2 seconds   INR 1.1 0.8 - 1.2    Comment: (NOTE) INR goal varies based on device and disease states. Performed at [facility], [place] [address] [place], [place] [address]   APTT     Status: None   Collection Time: 02/20/20 12:43 PM  Result Value Ref Range   aPTT 33 24 - 36 seconds    Comment: Performed at [facility], [place] [address], [place],  [address]  CBC     Status: None   Collection Time: 02/20/20 12:43 PM  Result Value Ref Range   WBC 6.4 4.0 - 10.5 K/uL   RBC 5.01 4.22 - 5.81 MIL/uL   Hemoglobin 15.9 13.0 - 17.0 g/dL   HCT 47.5 39 - 52 %   MCV 94.8 80.0 - 100.0 fL   MCH 31.7 26.0 - 34.0 pg   MCHC 33.5 30.0 - 36.0 g/dL   RDW 12.7 11.5 - 15.5 %   Platelets 247 150 - 400 K/uL   nRBC 0.0 0.0 - 0.2 %    Comment: Performed at [facility], [place] [address] [place],  [address]  Differential     Status: None   Collection Time: 02/20/20 12:43 PM  Result Value Ref Range   Neutrophils Relative % 65 %   Neutro Abs 4.2 1.7 - 7.7  K/uL   Lymphocytes Relative 19 %   Lymphs Abs 1.2 0.7 - 4.0 K/uL   Monocytes Relative 13 %   Monocytes Absolute 0.8 0.1 - 1.0 K/uL   Eosinophils Relative 2 %  Eosinophils Absolute 0.1 0.0 - 0.5 K/uL   Basophils Relative 1 %   Basophils Absolute 0.1 0.0 - 0.1 K/uL   Immature Granulocytes 0 %   Abs Immature Granulocytes 0.02 0.00 - 0.07 K/uL    Comment: Performed at [facility], [place] [address], [place], [place] [address]  Comprehensive metabolic panel     Status: Abnormal   Collection Time: 02/20/20 12:43 PM  Result Value Ref Range   Sodium 137 135 - 145 mmol/L   Potassium 3.8 3.5 - 5.1 mmol/L   Chloride 103 98 - 111 mmol/L   CO2 26 22 - 32 mmol/L   Glucose, Bld 104 (H) 70 - 99 mg/dL    Comment: Glucose reference range applies only to samples taken after fasting for at least 8 hours.   BUN 22 8 - 23 mg/dL   Creatinine, Ser 0.92 0.61 - 1.24 mg/dL   Calcium 8.9 8.9 - 10.3 mg/dL   Total Protein 7.4 6.5 - 8.1 g/dL   Albumin 4.5 3.5 - 5.0 g/dL   AST 21 15 - 41 U/L   ALT 15 0 - 44 U/L   Alkaline Phosphatase 68 38 - 126 U/L   Total Bilirubin 0.9 0.3 - 1.2 mg/dL   GFR, Estimated >60 >60 mL/min    Comment: (NOTE) Calculated using the CKD-EPI Creatinine Equation (2021)    Anion gap 8 5 - 15    Comment: Performed at [facility], [place] [address] [place], [place] [address]  Ginger Carne 8, ED     Status: Abnormal   Collection Time: 02/20/20  1:05 PM  Result Value Ref Range   Sodium 141 135 - 145 mmol/L   Potassium 3.9 3.5 - 5.1 mmol/L   Chloride 103 98 - 111 mmol/L   BUN 23 8 - 23 mg/dL   Creatinine, Ser 1.00 0.61 - 1.24 mg/dL   Glucose, Bld 102 (H) 70 - 99 mg/dL    Comment: Glucose reference range applies only to samples taken after fasting for at least 8 hours.   Calcium, Ion 1.18 1.15 - 1.40 mmol/L   TCO2 26 22 - 32 mmol/L   Hemoglobin 16.3 13.0 - 17.0 g/dL   HCT 48.0 39 - 52 %  Urine rapid drug screen (hosp performed)     Status: Abnormal    Collection Time: 02/20/20  8:20 PM  Result Value Ref Range   Opiates POSITIVE (A) NONE DETECTED   Cocaine NONE DETECTED NONE DETECTED   Benzodiazepines NONE DETECTED NONE DETECTED   Amphetamines NONE DETECTED NONE DETECTED   Tetrahydrocannabinol NONE DETECTED NONE DETECTED   Barbiturates NONE DETECTED NONE DETECTED    Comment: (NOTE) DRUG SCREEN FOR MEDICAL PURPOSES ONLY.  IF CONFIRMATION IS NEEDED FOR ANY PURPOSE, NOTIFY LAB WITHIN 5 DAYS.  LOWEST DETECTABLE LIMITS FOR URINE DRUG SCREEN Drug Class                     Cutoff (ng/mL) Amphetamine and metabolites    1000 Barbiturate and metabolites    200 Benzodiazepine                 962 Tricyclics and metabolites     300 Opiates and metabolites        300 Cocaine and metabolites        300 THC  50 Performed at [facility], [place] [address], [place], [place] [address]   Urinalysis, Routine w reflex microscopic Urine, Clean Catch     Status: Abnormal   Collection Time: 02/20/20  8:20 PM  Result Value Ref Range   Color, Urine STRAW (A) YELLOW   APPearance CLEAR CLEAR   Specific Gravity, Urine 1.012 1.005 - 1.030   pH 7.0 5.0 - 8.0   Glucose, UA NEGATIVE NEGATIVE mg/dL   Hgb urine dipstick NEGATIVE NEGATIVE   Bilirubin Urine NEGATIVE NEGATIVE   Ketones, ur 5 (A) NEGATIVE mg/dL   Protein, ur NEGATIVE NEGATIVE mg/dL   Nitrite NEGATIVE NEGATIVE   Leukocytes,Ua NEGATIVE NEGATIVE    Comment: Performed at [place] [address], [place], [place] [address]  Resp Panel by RT-PCR (Flu A&B, Covid) Nasopharyngeal Swab     Status: None   Collection Time: 02/20/20  8:55 PM   Specimen: Nasopharyngeal Swab; Nasopharyngeal(NP) swabs in vial transport medium  Result Value Ref Range   SARS Coronavirus 2 by RT PCR NEGATIVE NEGATIVE    Comment: (NOTE) SARS-CoV-2 target nucleic acids are NOT DETECTED.  The SARS-CoV-2 RNA is generally detectable in upper  respiratory specimens during the acute phase of infection. The lowest concentration of SARS-CoV-2 viral copies this assay can detect is 138 copies/mL. A negative result does not preclude SARS-Cov-2 infection and should not be used as the sole basis for treatment or other patient management decisions. A negative result may occur with  improper specimen collection/handling, submission of specimen other than nasopharyngeal swab, presence of viral mutation(s) within the areas targeted by this assay, and inadequate number of viral copies(<138 copies/mL). A negative result must be combined with clinical observations, patient history, and epidemiological information. The expected result is Negative.  Fact Sheet for Patients:  EntrepreneurPulse.com.au  Fact Sheet for Healthcare Providers:  IncredibleEmployment.be  This test is no t yet approved or cleared by the [place] FDA and  has been authorized for detection and/or diagnosis of SARS-CoV-2 by FDA under an Emergency Use Authorization (EUA). This EUA will remain  in effect (meaning this test can be used) for the duration of the COVID-19 declaration under Section 564(b)(1) of the Act, 21 U.S.C.section 360bbb-3(b)(1), unless the authorization is terminated  or revoked sooner.       Influenza A by PCR NEGATIVE NEGATIVE   Influenza B by PCR NEGATIVE NEGATIVE    Comment: (NOTE) The Xpert Xpress SARS-CoV-2/FLU/RSV plus assay is intended as an aid in the diagnosis of influenza from Nasopharyngeal swab specimens and should not be used as a sole basis for treatment. Nasal washings and aspirates are unacceptable for Xpert Xpress SARS-CoV-2/FLU/RSV testing.  Fact Sheet for Patients: EntrepreneurPulse.com.au  Fact Sheet for Healthcare Providers: IncredibleEmployment.be  This test is not yet approved or cleared by the [place] FDA and has been authorized for  detection and/or diagnosis of SARS-CoV-2 by FDA under an Emergency Use Authorization (EUA). This EUA will remain in effect (meaning this test can be used) for the duration of the COVID-19 declaration under Section 564(b)(1) of the Act, 21 U.S.C. section 360bbb-3(b)(1), unless the authorization is terminated or revoked.  Performed at [facility], [place] [address], [place], [place] [address]   Hemoglobin A1c     Status: None   Collection Time: 02/21/20  2:53 AM  Result Value Ref Range   Hgb A1c MFr Bld 5.6 4.8 - 5.6 %    Comment: (NOTE) Pre diabetes:  5.7%-6.4%  Diabetes:              >6.4%  Glycemic control for   <7.0% adults with diabetes    Mean Plasma Glucose 114.02 mg/dL    Comment: Performed at [place] [address] [place], [place] [address]  Lipid panel     Status: Abnormal   Collection Time: 02/21/20  2:53 AM  Result Value Ref Range   Cholesterol 204 (H) 0 - 200 mg/dL   Triglycerides 199 (H) <150 mg/dL   HDL 42 >40 mg/dL   Total CHOL/HDL Ratio 4.9 RATIO   VLDL 40 0 - 40 mg/dL   LDL Cholesterol 122 (H) 0 - 99 mg/dL    Comment:        Total Cholesterol/HDL:CHD Risk Coronary Heart Disease Risk Table                     Men   Women  1/2 Average Risk   3.4   3.3  Average Risk       5.0   4.4  2 X Average Risk   9.6   7.1  3 X Average Risk  23.4   11.0        Use the calculated Patient Ratio above and the CHD Risk Table to determine the patient's CHD Risk.        ATP III CLASSIFICATION (LDL):  <100     mg/dL   Optimal  100-129  mg/dL   Near or Above                    Optimal  130-159  mg/dL   Borderline  160-189  mg/dL   High  >190     mg/dL   Very High Performed at [place] [address] [place], [place] [address]    CT Code Stroke CTA Head W/WO contrast  Result Date: 02/21/2020 CLINICAL DATA:  Stroke workup EXAM: CT ANGIOGRAPHY HEAD AND NECK TECHNIQUE: Multidetector CT imaging of the head and neck was  performed using the standard protocol during bolus administration of intravenous contrast. Multiplanar CT image reconstructions and MIPs were obtained to evaluate the vascular anatomy. Carotid stenosis measurements (when applicable) are obtained utilizing NASCET criteria, using the distal internal carotid diameter as the denominator. CONTRAST:  26mL OMNIPAQUE IOHEXOL 350 MG/ML SOLN COMPARISON:  CT and brain MRI from yesterday. FINDINGS: CTA NECK FINDINGS Aortic arch: Atheromatous plaque.  Three vessel branching. Right carotid system: Scattered mainly calcified atheromatous plaque primarily at the bifurcation. No flow limiting stenosis, ulceration, or beading. Left carotid system: Calcified plaque primarily at the bifurcation/bulb with at least 70% stenosis as measured on sagittal reformats. No ulceration or beading. Vertebral arteries: Proximal subclavian atherosclerosis without flow limiting stenosis. The left vertebral artery is dominant. Vertebral arteries are smooth and widely patent to the dura. Skeleton: Advanced, generalized disc and facet degeneration with C3-4 anterolisthesis. Partially covered left glenohumeral joint with intra-articular bodies in the medial recess. Other neck: No masslike or inflammatory finding seen. Upper chest: No acute finding Review of the MIP images confirms the above findings CTA HEAD FINDINGS Anterior circulation: Calcified plaque along the carotid siphons. No branch occlusion, beading, or proximal flow limiting stenosis. Negative for aneurysm. Posterior circulation: Left dominant vertebral artery. Patent bilateral PICA. Patent bilateral superior cerebellar arteries with symmetric enhancement. A faint right AICA is present. Advanced right P2 segment stenosis. High-grade left P3 segment stenosis. No beading or aneurysm. Venous sinuses:  Unremarkable in the arterial phase Anatomic variants: None significant Review of the MIP images confirms the above findings IMPRESSION: 1. No  emergent finding.  No evident cerebellar branch occlusion. 2. At least 70% left ICA origin stenosis. 3. High-grade right P2 and left P3 atheromatous stenoses. Electronically Signed   By: Monte Fantasia M.D.   On: 02/21/2020 08:13   CT HEAD WO CONTRAST  Result Date: 02/20/2020 CLINICAL DATA:  Slurred speech, blurred vision. EXAM: CT HEAD WITHOUT CONTRAST TECHNIQUE: Contiguous axial images were obtained from the base of the skull through the vertex without intravenous contrast. COMPARISON:  May 24, 2019. FINDINGS: Brain: Mild diffuse cortical atrophy is noted. Mild chronic ischemic white matter disease is noted. No mass effect or midline shift is noted. Ventricular size is within normal limits. There is no evidence of mass lesion or hemorrhage. Low density is noted in the region of the right cerebellar peduncle concerning for infarction of indeterminate age. Vascular: No hyperdense vessel or unexpected calcification. Skull: Normal. Negative for fracture or focal lesion. Sinuses/Orbits: No acute finding. Other: None. IMPRESSION: Mild diffuse cortical atrophy. Mild chronic ischemic white matter disease. Low density is noted in the region of the right cerebellar peduncle concerning for infarction of indeterminate age. MRI is recommended for further evaluation. Electronically Signed   By: Marijo Conception M.D.   On: 02/20/2020 13:12   CT Code Stroke CTA Neck W/WO contrast  Result Date: 02/21/2020 CLINICAL DATA:  Stroke workup EXAM: CT ANGIOGRAPHY HEAD AND NECK TECHNIQUE: Multidetector CT imaging of the head and neck was performed using the standard protocol during bolus administration of intravenous contrast. Multiplanar CT image reconstructions and MIPs were obtained to evaluate the vascular anatomy. Carotid stenosis measurements (when applicable) are obtained utilizing NASCET criteria, using the distal internal carotid diameter as the denominator. CONTRAST:  26mL OMNIPAQUE IOHEXOL 350 MG/ML SOLN COMPARISON:   CT and brain MRI from yesterday. FINDINGS: CTA NECK FINDINGS Aortic arch: Atheromatous plaque.  Three vessel branching. Right carotid system: Scattered mainly calcified atheromatous plaque primarily at the bifurcation. No flow limiting stenosis, ulceration, or beading. Left carotid system: Calcified plaque primarily at the bifurcation/bulb with at least 70% stenosis as measured on sagittal reformats. No ulceration or beading. Vertebral arteries: Proximal subclavian atherosclerosis without flow limiting stenosis. The left vertebral artery is dominant. Vertebral arteries are smooth and widely patent to the dura. Skeleton: Advanced, generalized disc and facet degeneration with C3-4 anterolisthesis. Partially covered left glenohumeral joint with intra-articular bodies in the medial recess. Other neck: No masslike or inflammatory finding seen. Upper chest: No acute finding Review of the MIP images confirms the above findings CTA HEAD FINDINGS Anterior circulation: Calcified plaque along the carotid siphons. No branch occlusion, beading, or proximal flow limiting stenosis. Negative for aneurysm. Posterior circulation: Left dominant vertebral artery. Patent bilateral PICA. Patent bilateral superior cerebellar arteries with symmetric enhancement. A faint right AICA is present. Advanced right P2 segment stenosis. High-grade left P3 segment stenosis. No beading or aneurysm. Venous sinuses: Unremarkable in the arterial phase Anatomic variants: None significant Review of the MIP images confirms the above findings IMPRESSION: 1. No emergent finding.  No evident cerebellar branch occlusion. 2. At least 70% left ICA origin stenosis. 3. High-grade right P2 and left P3 atheromatous stenoses. Electronically Signed   By: Monte Fantasia M.D.   On: 02/21/2020 08:13   MR BRAIN WO CONTRAST  Result Date: 02/20/2020 CLINICAL DATA:  Right-sided weakness, slurred speech, blurry vision EXAM: MRI HEAD WITHOUT CONTRAST TECHNIQUE:  Multiplanar,  multiecho pulse sequences of the brain and surrounding structures were obtained without intravenous contrast. COMPARISON:  05/24/2019 FINDINGS: Brain: There is area of mildly reduced diffusion within the right brachium pontis measuring approximately 1.4 cm. There is corresponding T2 hyperintensity with rounded appearance. Additional patchy and confluent areas of T2 hyperintensity in the supratentorial white matter are nonspecific but probably reflect moderate to advanced chronic microvascular ischemic changes. Small chronic right cerebellar infarct. There is no intracranial hemorrhage. There is no intracranial mass or significant mass effect. There is no hydrocephalus or extra-axial fluid collection. Prominence of the ventricles and sulci reflects generalized parenchymal volume loss similar to the prior study. Vascular: Major vessel flow voids at the skull base are preserved. Skull and upper cervical spine: Normal marrow signal is preserved. Sinuses/Orbits: Minor mucosal thickening. Bilateral lens replacements. Other: Sella is unremarkable.  Mastoid air cells are clear. IMPRESSION: Area of abnormal signal within the right middle cerebellar peduncle may reflect acute to subacute infarct. Given rounded appearance, follow-up is recommended ensure appropriate evolution (suggest addition of post contrast imaging at that time). Moderate to advanced chronic microvascular ischemic changes. Small chronic right cerebellar infarct. Electronically Signed   By: Macy Mis M.D.   On: 02/20/2020 15:44   MR BRAIN W CONTRAST  Result Date: 02/20/2020 CLINICAL DATA:  Right middle cerebellar peduncle lesion. EXAM: MRI HEAD WITH CONTRAST TECHNIQUE: Multiplanar, multiecho pulse sequences of the brain and surrounding structures were obtained with intravenous contrast. CONTRAST:  82mL GADAVIST GADOBUTROL 1 MMOL/ML IV SOLN COMPARISON:  Brain MRI without contrast 02/20/2020 FINDINGS: There is no abnormal contrast  enhancement at the site of the right middle cerebellar peduncle lesion. There is a punctate focus of contrast enhancement within the posterior right cerebellar hemisphere (series 5, image 43) that is favored to be vascular. IMPRESSION: 1. No abnormal contrast enhancement at the site of the right middle cerebellar peduncle lesion. 2. Punctate focus of enhancement in the posterior right cerebellar hemispheres favored to be vascular, but attention on follow-up scan in 6-12 weeks recommended. Electronically Signed   By: Ulyses Jarred M.D.   On: 02/20/2020 19:19   DG CHEST PORT 1 VIEW  Result Date: 02/20/2020 CLINICAL DATA:  CVA EXAM: PORTABLE CHEST 1 VIEW COMPARISON:  10/31/2003 FINDINGS: Single frontal view of the chest demonstrates mild enlargement the cardiac silhouette, accentuated by portable AP technique. No airspace disease, effusion, or pneumothorax. Significant bilateral shoulder osteoarthritis. No acute fracture. IMPRESSION: 1. Enlarged cardiac silhouette. 2. No acute airspace disease. Electronically Signed   By: Randa Ngo M.D.   On: 02/20/2020 23:50   ECHOCARDIOGRAM COMPLETE  Result Date: 02/21/2020    ECHOCARDIOGRAM REPORT   Patient Name:   KIM LAUVER Date of Exam: 02/21/2020 Medical Rec #:  [id number]      Height:       67.0 in Accession #:    [id number]     Weight:       191.2 lb Date of Birth:  1933-02-23       BSA:          1.984 m Patient Age:    80 years       BP:           154/104 mmHg Patient Gender: M              HR:           97 bpm. Exam Location:  Inpatient Procedure: 2D Echo, Cardiac Doppler and Color Doppler Indications:    Stroke  History:  Patient has no prior history of Echocardiogram examinations.                 Stroke, Signs/Symptoms:Dyspnea; Risk Factors:Hypertension.  Sonographer:    Roseanna Rainbow RDCS Referring Phys: [place]  Sonographer Comments: Technically difficult study due to poor echo windows. Patient did not have IV access, and refused to get  one. Definity not used. IMPRESSIONS  1. Left ventricular ejection fraction, by estimation, is 30 to 35%. The left ventricle has moderately decreased function. The left ventricle demonstrates global hypokinesis. The left ventricular internal cavity size was mildly dilated. There is severe concentric left ventricular hypertrophy. Left ventricular diastolic parameters are indeterminate.  2. Right ventricular systolic function is normal. The right ventricular size is normal.  3. Left atrial size was severely dilated.  4. The mitral valve is grossly normal. Mild mitral valve regurgitation.  5. The aortic valve is calcified. Aortic valve regurgitation is not visualized. Mild to moderate aortic valve stenosis. Aortic valve mean gradient measures 25.0 mmHg. Comparison(s): No prior Echocardiogram. Conclusion(s)/Recommendation(s): LV function is reduced. There is at least mild to moderate aortic stenosis; this may be underestimated in the setting of LV function. FINDINGS  Left Ventricle: LVMI 197 g/m2 RWT 0.54. Left ventricular ejection fraction, by estimation, is 30 to 35%. The left ventricle has moderately decreased function. The left ventricle demonstrates global hypokinesis. The left ventricular internal cavity size was mildly dilated. There is severe concentric left ventricular hypertrophy. Left ventricular diastolic parameters are indeterminate. Right Ventricle: The right ventricular size is normal. No increase in right ventricular wall thickness. Right ventricular systolic function is normal. Left Atrium: Left atrial size was severely dilated. Right Atrium: Right atrial size was normal in size. Pericardium: There is no evidence of pericardial effusion. Mitral Valve: The mitral valve is grossly normal. There is mild thickening of the mitral valve leaflet(s). Mild to moderate mitral annular calcification. Mild mitral valve regurgitation. Tricuspid Valve: The tricuspid valve is grossly normal. Tricuspid valve  regurgitation is not demonstrated. Aortic Valve: LV SVI 38. The aortic valve is calcified. Aortic valve regurgitation is not visualized. Mild to moderate aortic stenosis is present. Aortic valve mean gradient measures 25.0 mmHg. Aortic valve peak gradient measures 39.1 mmHg. Aortic valve area, by VTI measures 1.32 cm. Pulmonic Valve: The pulmonic valve was grossly normal. Pulmonic valve regurgitation is not visualized. Aorta: The aortic root is normal in size and structure. Venous: The pulmonary veins were not well visualized. The inferior vena cava was not well visualized. IAS/Shunts: The atrial septum is grossly normal.  LEFT VENTRICLE PLAX 2D LVIDd:         5.90 cm      Diastology LVIDs:         5.30 cm      LV e' medial:    3.99 cm/s LV PW:         1.60 cm      LV E/e' medial:  20.7 LV IVS:        1.30 cm      LV e' lateral:   4.12 cm/s LVOT diam:     2.45 cm      LV E/e' lateral: 20.0 LV SV:         80 LV SV Index:   40 LVOT Area:     4.71 cm  LV Volumes (MOD) LV vol d, MOD A2C: 165.0 ml LV vol d, MOD A4C: 119.5 ml LV vol s, MOD A2C: 111.0 ml LV vol s, MOD A4C:  76.0 ml LV SV MOD A2C:     54.0 ml LV SV MOD A4C:     119.5 ml LV SV MOD BP:      48.8 ml RIGHT VENTRICLE             IVC RV S prime:     18.00 cm/s  IVC diam: 3.10 cm TAPSE (M-mode): 2.3 cm LEFT ATRIUM             Index       RIGHT ATRIUM           Index LA diam:        4.20 cm 2.12 cm/m  RA Area:     12.20 cm LA Vol (A2C):   70.5 ml 35.54 ml/m RA Volume:   24.90 ml  12.55 ml/m LA Vol (A4C):   94.0 ml 47.38 ml/m LA Biplane Vol: 85.4 ml 43.05 ml/m  AORTIC VALVE AV Area (Vmax):    1.22 cm AV Area (Vmean):   1.08 cm AV Area (VTI):     1.32 cm AV Vmax:           312.75 cm/s AV Vmean:          236.500 cm/s AV VTI:            0.608 m AV Peak Grad:      39.1 mmHg AV Mean Grad:      25.0 mmHg LVOT Vmax:         80.90 cm/s LVOT Vmean:        54.400 cm/s LVOT VTI:          0.170 m LVOT/AV VTI ratio: 0.28  AORTA Ao Root diam: 3.20 cm Ao Asc diam:  3.10  cm MITRAL VALVE MV Area (PHT): 4.97 cm    SHUNTS MV Decel Time: 153 msec    Systemic VTI:  0.17 m MV E velocity: 82.53 cm/s  Systemic Diam: 2.45 cm Rudean Haskell MD Electronically signed by Rudean Haskell MD Signature Date/Time: 02/21/2020/4:33:50 PM    Final        Medical Problem List and Plan: 1.  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  2.  Antithrombotics: -DVT/anticoagulation: SCDs  -antiplatelet therapy: Aspirin 81 mg daily and Plavix 75 mg daily x3 weeks then aspirin alone 3. Pain Management/chronic back pain: Neurontin 600 mg 3 times daily, hydrocodone as needed 4. Mood: Ativan 1 mg every 8 hours as needed anxiety provide emotional support  -antipsychotic agents: N/A 5. Neuropsych: This patient is capable of making decisions on his own behalf. 6. Skin/Wound Care: Routine skin checks 7. Fluids/Electrolytes/Nutrition: Routine in and outs with follow-up chemistries 8.  Cardiomyopathy.  Ejection fraction 30 to 35%.  Follow-up cardiology services 9.  Carotid stenosis.  Left ICA origin 70% stenosis.  Recommend vascular outpatient follow-up 10.  Permissive hypertension.  Patient on Norvasc 2.5 mg daily prior to admission.  Resume as needed 11.  BPH.  Flomax 0.4 mg daily. Voiding well 12.  GERD.  Protonix 13.  Hyperlipidemia.  Zetia/Pravachol 14.Tobacco abuse.Counseling 15. Word finding issues- pt specifically asking for SLP to work on this issue.   Lavon Paganini Angiulli, PA-C 02/22/2020   I have personally performed a face to face diagnostic evaluation of this patient and formulated the key components of the plan.  Additionally, I  have personally reviewed laboratory data, imaging studies, as well as relevant notes and concur with the physician assistant's documentation above.   The  patient's status has not changed from the original H&P.  Any changes in documentation from the acute care chart have been noted above.

## 2020-02-22 NOTE — Progress Notes (Signed)
Physical Therapy Treatment Patient Details Name: Ruben Barrera MRN: [id number] DOB: 1932/08/05 Today's Date: 02/22/2020    History of Present Illness Pt is an 86 year old male who presented with multiple intermittent bouts of slurred speech, confusion, and blurred/double vision since 02/16/20. He has also been displaying gait abnormalities since October. CT and MRI of head revealed possible acute or subacute R middle cerebellar peduncle infarct. NIHSS 0-2. PMH: lumbar spinal stenosis, HTN, dysrhythmia, and arthritis.     PT Comments    Pt is making progress towards his goals and is very motivated to improve and participate. He would greatly benefit from intensive therapy services in the CIR setting to progress his safety awareness and address his deficits to maximize his independence and safety with all functional mobility. Attempted ambulating with a RW this date with him displaying improved balance and only requiring minA during LOB bouts. He continues to display coordination deficits resulting in ataxic gait patterns limiting his feet clearance during gait. This causes him to lose his balance. He is unaware of these deficits and does not acknowledge that he has lost his balance and thus requires extra cues to be aware of these safety concerns. He is at a high risk for falls and injuries, as is supported by his DGI score of 9 this date. Will continue to follow acutely.    Follow Up Recommendations  CIR;Supervision for mobility/OOB     Equipment Recommendations  Rolling walker with 5" wheels;3in1 (PT)    Recommendations for Other Services       Precautions / Restrictions Precautions Precautions: Fall Restrictions Weight Bearing Restrictions: No    Mobility  Bed Mobility Overal bed mobility: Needs Assistance Bed Mobility: Supine to Sit;Sit to Supine     Supine to sit: Supervision Sit to supine: Supervision   General bed mobility comments: Pt able to transition supine <> sit with  quick movements with bed flat and no use of bed rails, supervision for safety.  Transfers Overall transfer level: Needs assistance Equipment used: Rolling walker (2 wheeled) Transfers: Sit to/from Stand Sit to Stand: Min assist         General transfer comment: Pt impulsive in coming to stand when directed to remain seated, minA for steadying due to posterior trunk sway, with pt denying any LOB.  Ambulation/Gait Ambulation/Gait assistance: Min assist Gait Distance (Feet): 115 Feet Assistive device: Rolling walker (2 wheeled) Gait Pattern/deviations: Step-through pattern;Decreased stride length;Decreased dorsiflexion - right;Ataxic;Narrow base of support;Decreased dorsiflexion - left Gait velocity: decreased Gait velocity interpretation: 1.31 - 2.62 ft/sec, indicative of limited community ambulator General Gait Details: Ambulates with ataxic movements resulting in poor feet clearance with gait and intermittent tripping and LOB, resulting in minA to recover. Cued pt to remain within RW, momentary success. Verbal and visual cues provided to improve heel strike with mod success. Pt looking inferiorly majority of gait bout.    Stairs             Wheelchair Mobility    Modified Rankin (Stroke Patients Only) Modified Rankin (Stroke Patients Only) Pre-Morbid Rankin Score: No symptoms Modified Rankin: Moderately severe disability     Balance Overall balance assessment: Needs assistance Sitting-balance support: Feet supported Sitting balance-Leahy Scale: Good Sitting balance - Comments: No LOB sitting statically EOB, supervision for safety.   Standing balance support: Bilateral upper extremity supported;During functional activity;No upper extremity supported Standing balance-Leahy Scale: Fair Standing balance comment: 2 UE support on RW majority of time but intermittent impulsive release of RW and  posterior LOB, minA to recover balance.                 Standardized  Balance Assessment Standardized Balance Assessment : Dynamic Gait Index   Dynamic Gait Index Level Surface: Moderate Impairment Change in Gait Speed: Moderate Impairment Gait with Horizontal Head Turns: Moderate Impairment Gait with Vertical Head Turns: Moderate Impairment Gait and Pivot Turn: Moderate Impairment Step Over Obstacle: Mild Impairment Step Around Obstacles: Severe Impairment Steps: Mild Impairment Total Score: 9      Cognition Arousal/Alertness: Awake/alert Behavior During Therapy: Impulsive Overall Cognitive Status: Impaired/Different from baseline Area of Impairment: Attention;Safety/judgement;Awareness;Following commands;Problem solving                   Current Attention Level: Sustained   Following Commands: Follows one step commands inconsistently;Follows one step commands with increased time Safety/Judgement: Decreased awareness of safety;Decreased awareness of deficits Awareness: Emergent Problem Solving: Slow processing;Difficulty sequencing;Requires verbal cues;Requires tactile cues General Comments: Pt impulsive to stand when directed otherwise. Pt requires repeated cues to maintain safety by remaining proximal to RW. Pt denies LOB and majority of deficits and requires education to acknowledge them.      Exercises      General Comments        Pertinent Vitals/Pain Pain Assessment: Faces Faces Pain Scale: No hurt Pain Intervention(s): Limited activity within patient's tolerance;Monitored during session;Repositioned    Home Living                      Prior Function            PT Goals (current goals can now be found in the care plan section) Acute Rehab PT Goals Patient Stated Goal: to go home Sunday  PT Goal Formulation: With patient Time For Goal Achievement: 03/08/20 Potential to Achieve Goals: Good Progress towards PT goals: Progressing toward goals    Frequency    Min 4X/week      PT Plan Current plan  remains appropriate    Co-evaluation              AM-PAC PT "6 Clicks" Mobility   Outcome Measure  Help needed turning from your back to your side while in a flat bed without using bedrails?: None Help needed moving from lying on your back to sitting on the side of a flat bed without using bedrails?: None Help needed moving to and from a bed to a chair (including a wheelchair)?: A Little Help needed standing up from a chair using your arms (e.g., wheelchair or bedside chair)?: A Little Help needed to walk in hospital room?: A Little Help needed climbing 3-5 steps with a railing? : A Lot 6 Click Score: 19    End of Session Equipment Utilized During Treatment: Gait belt Activity Tolerance: Patient tolerated treatment well Patient left: in chair;with call bell/phone within reach;with chair alarm set   PT Visit Diagnosis: Unsteadiness on feet (R26.81);Other abnormalities of gait and mobility (R26.89);Difficulty in walking, not elsewhere classified (R26.2);Other symptoms and signs involving the nervous system (N82.956)     Time: 2130-8657 PT Time Calculation (min) (ACUTE ONLY): 15 min  Charges:  $Gait Training: 8-22 mins                     Moishe Spice, PT, DPT Acute Rehabilitation Services  Pager: [phone] Office: [place] 02/22/2020, 8:51 AM

## 2020-02-22 NOTE — Telephone Encounter (Signed)
Can use work-in slot.  4 weeks is okay

## 2020-02-22 NOTE — Consult Note (Incomplete)
Cardiology Consultation:   Patient ID: Ruben Barrera MRN: [id number]; DOB: December 26, 1932  Admit date: 02/20/2020 Date of Consult: 02/22/2020  Primary Care Provider: Lajean Manes, [facility] Cardiologist: Ena Dawley, MD  [place] Electrophysiologist:  None    Patient Profile:   Ruben Barrera is a 86 y.o. male with a hx of depression, GERD, HLD, HTN, radiculopathy, no CAD- normal nuc study in 2016, now admitted with visual and gait disturbance and CVA who is being seen today for the evaluation of abnormal Echo with low EF at the request of Dr. Karleen Hampshire.  History of Present Illness:   Ruben Barrera saw Dr. Meda Coffee in 2016 for DOE and nuc stress test was neg for ischemia.  His other hx as above.  Now admitted with visual and gait disturbance and found to have Right cerebellar peduncle stroke and plan for admit to CIR for rehab.  In course of work up echo was done with EF 30-35%, global hypokinesis, LV internal cavity size ws mildly dilated, there is severe concentric LVH. .   RV is normal.  LA severely dilated Mild MR,  Mild to mod AS this may be underestimated in the setting of LV function.   EKG:  The EKG was personally reviewed and demonstrates:  02/20/20 SR with PACs and PVCs non specific T wave abnormality in March 2021 freq PVCs  Telemetry:  Telemetry was personally reviewed and demonstrates:  SR with freg PVC and salvos   CXR with enlarge cardiac silhouette no CHF or effusion.  Na 141, K+ 3.9, BUN 23, Cr 1.0  LDL 122 TG 199 HDL 42  hgb 16.3 A1C 5.6  COVID neg   BP 138/93 to 107/90 P 51 to 89 afebrile   Past Medical History:  Diagnosis Date  . Arthritis   . Complication of anesthesia    "had the shivers" after surgery  . Depression   . Dysrhythmia    "extra heart beat"  . GERD (gastroesophageal reflux disease)   . History of hiatal hernia   . Hypercholesteremia   . Hypertension   . Lumbar spinal stenosis   . RLS (restless legs syndrome)    takes ativan as  needed    Past Surgical History:  Procedure Laterality Date  . BACK SURGERY    . LUMBAR LAMINECTOMY/DECOMPRESSION MICRODISCECTOMY N/A 08/28/2015   Procedure: Lumbar Four-Five decompressive lumbar laminectomy;  Surgeon: Jovita Gamma, MD;  Location: [facility];  Service: Neurosurgery;  Laterality: N/A;  . REPLACEMENT TOTAL KNEE BILATERAL Bilateral 11/05/2003  . TOE AMPUTATION Right 2010   second toe  . TONSILLECTOMY       Home Medications:  Prior to Admission medications   Medication Sig Start Date End Date Taking? Authorizing Provider  baclofen (LIORESAL) 10 MG tablet Take 10 mg by mouth 3 (three) times daily as needed for muscle spasms.  02/05/20  Yes [provider]  citalopram (CELEXA) 10 MG tablet Take 10 mg by mouth daily. 06/18/19  Yes [provider]  ezetimibe (ZETIA) 10 MG tablet Take 10 mg by mouth every evening.  08/06/15  Yes [provider]  fluticasone (FLONASE) 50 MCG/ACT nasal spray Place 1 spray into both nostrils daily as needed for allergies.    Yes [provider]  gabapentin (NEURONTIN) 600 MG tablet Take 600 mg by mouth 3 (three) times daily.   Yes [provider]  HYDROcodone-acetaminophen (NORCO) 7.5-325 MG tablet Take 1 tablet by mouth every 8 (eight) hours as needed for moderate pain.  Yes [provider]  LORazepam (ATIVAN) 1 MG tablet Take 1 mg by mouth daily.    Yes [provider]  MULTIPLE VITAMIN PO Take 1 tablet by mouth daily.    Yes [provider]  omeprazole (PRILOSEC) 20 MG capsule Take 20 mg by mouth daily.   Yes [provider]  tamsulosin (FLOMAX) 0.4 MG CAPS capsule Take 0.4 mg by mouth daily.    Yes [provider]  aspirin EC 81 MG EC tablet Take 1 tablet (81 mg total) by mouth daily. Swallow whole. 02/22/20   Hosie Poisson, MD  clopidogrel (PLAVIX) 75 MG tablet Take 1 tablet (75 mg total) by mouth daily for 21 days. 02/22/20 03/14/20  Hosie Poisson, MD   pravastatin (PRAVACHOL) 20 MG tablet Take 1 tablet (20 mg total) by mouth daily at 6 PM. 02/22/20   Hosie Poisson, MD    Inpatient Medications: Scheduled Meds: . aspirin EC  81 mg Oral Daily  . citalopram  10 mg Oral Daily  . clopidogrel  75 mg Oral Daily  . ezetimibe  10 mg Oral QPM  . gabapentin  600 mg Oral TID  . pantoprazole  40 mg Oral Daily  . pravastatin  20 mg Oral q1800  . tamsulosin  0.4 mg Oral Daily   Continuous Infusions: . sodium chloride 75 mL/hr at 02/21/20 0429   PRN Meds: acetaminophen **OR** acetaminophen (TYLENOL) oral liquid 160 mg/5 mL **OR** acetaminophen, HYDROcodone-acetaminophen, LORazepam  Allergies:    Allergies  Allergen Reactions  . Simvastatin Other (See Comments)    Leg pains   . Other Other (See Comments)    UNSPECIFIED REACTION  Reaction to anesthesia per patient    Social History:   Social History   Socioeconomic History  . Marital status: Married    Spouse name: Not on file  . Number of children: Not on file  . Years of education: Not on file  . Highest education level: Not on file  Occupational History  . Not on file  Tobacco Use  . Smoking status: Current Every Day Smoker    Types: Cigars  . Smokeless tobacco: Never Used  . Tobacco comment: quit 1972  Substance and Sexual Activity  . Alcohol use: Yes    Alcohol/week: 0.0 standard drinks    Comment: occasionally beer or scotch  . Drug use: No    Comment: 1 cigar per day, pt states he does not inhale   . Sexual activity: Not on file  Other Topics Concern  . Not on file  Social History Narrative   Right handed   Lives with wife one story home   Social Determinants of Health   Financial Resource Strain:   . Difficulty of Paying Living Expenses: Not on file  Food Insecurity:   . Worried About Charity fundraiser in the Last Year: Not on file  . Ran Out of Food in the Last Year: Not on file  Transportation Needs:   . Lack of Transportation (Medical): Not on file  .  Lack of Transportation (Non-Medical): Not on file  Physical Activity:   . Days of Exercise per Week: Not on file  . Minutes of Exercise per Session: Not on file  Stress:   . Feeling of Stress : Not on file  Social Connections:   . Frequency of Communication with Friends and Family: Not on file  . Frequency of Social Gatherings with Friends and Family: Not on file  . Attends Religious Services: Not  on file  . Active Member of Clubs or Organizations: Not on file  . Attends Archivist Meetings: Not on file  . Marital Status: Not on file  Intimate Partner Violence:   . Fear of Current or Ex-Partner: Not on file  . Emotionally Abused: Not on file  . Physically Abused: Not on file  . Sexually Abused: Not on file    Family History:    Family History  Problem Relation Age of Onset  . Stroke Mother   . Hypertension Other        family history     ROS:  Please see the history of present illness.  General:no colds or fevers, no weight changes Skin:no rashes or ulcers HEENT:no blurred vision, no congestion CV:see HPI PUL:see HPI GI:no diarrhea constipation or melena, no indigestion GU:no hematuria, no dysuria MS:no joint pain, no claudication Neuro:no syncope, no lightheadedness Endo:no diabetes, no thyroid disease  All other ROS reviewed and negative.     Physical Exam/Data:   Vitals:   02/22/20 0004 02/22/20 0411 02/22/20 0806 02/22/20 1232  BP: 130/77 (!) 152/86 (!) 138/93 107/90  Pulse: 72 88 (!) 51 89  Resp: 17 17 18 18   Temp: (!) 97.4 F (36.3 C) (!) 97.3 F (36.3 C) 97.8 F (36.6 C) 97.6 F (36.4 C)  TempSrc: Oral Oral Oral Oral  SpO2: 97% 91% 95% 94%  Weight:        Intake/Output Summary (Last 24 hours) at 02/22/2020 1424 Last data filed at 02/22/2020 0300 Gross per 24 hour  Intake 0 ml  Output -  Net 0 ml   Last 3 Weights 02/21/2020 09/19/2019 01/06/2016  Weight (lbs) 185 lb 3 oz 191 lb 3.2 oz 172 lb 9.6 oz  Weight (kg) 84 kg 86.728 kg 78.291 kg      Body mass index is 29 kg/m.  General:  Well nourished, well developed, in no acute distress*** HEENT: normal Lymph: no adenopathy Neck: no JVD Endocrine:  No thryomegaly Vascular: No carotid bruits; FA pulses 2+ bilaterally without bruits  Cardiac:  normal S1, S2; RRR; no murmur *** Lungs:  clear to auscultation bilaterally, no wheezing, rhonchi or rales  Abd: soft, nontender, no hepatomegaly  Ext: no edema Musculoskeletal:  No deformities, BUE and BLE strength normal and equal Skin: warm and dry  Neuro:  CNs 2-12 intact, no focal abnormalities noted Psych:  Normal affect   Relevant CV Studies: Echo 02/21/20 IMPRESSIONS    1. Left ventricular ejection fraction, by estimation, is 30 to 35%. The  left ventricle has moderately decreased function. The left ventricle  demonstrates global hypokinesis. The left ventricular internal cavity size  was mildly dilated. There is severe  concentric left ventricular hypertrophy. Left ventricular diastolic  parameters are indeterminate.  2. Right ventricular systolic function is normal. The right ventricular  size is normal.  3. Left atrial size was severely dilated.  4. The mitral valve is grossly normal. Mild mitral valve regurgitation.  5. The aortic valve is calcified. Aortic valve regurgitation is not  visualized. Mild to moderate aortic valve stenosis. Aortic valve mean  gradient measures 25.0 mmHg.   Comparison(s): No prior Echocardiogram.   Conclusion(s)/Recommendation(s): LV function is reduced. There is at least  mild to moderate aortic stenosis; this may be underestimated in the  setting of LV function.   FINDINGS  Left Ventricle: LVMI 197 g/m2  RWT 0.54. Left ventricular ejection fraction, by estimation, is 30 to 35%.  The left ventricle has moderately decreased  function. The left ventricle  demonstrates global hypokinesis. The left ventricular internal cavity size  was mildly dilated. There is  severe  concentric left ventricular hypertrophy. Left ventricular diastolic  parameters are indeterminate.   Right Ventricle: The right ventricular size is normal. No increase in  right ventricular wall thickness. Right ventricular systolic function is  normal.   Left Atrium: Left atrial size was severely dilated.   Right Atrium: Right atrial size was normal in size.   Pericardium: There is no evidence of pericardial effusion.   Mitral Valve: The mitral valve is grossly normal. There is mild thickening  of the mitral valve leaflet(s). Mild to moderate mitral annular  calcification. Mild mitral valve regurgitation.   Tricuspid Valve: The tricuspid valve is grossly normal. Tricuspid valve  regurgitation is not demonstrated.   Aortic Valve: LV SVI 38. The aortic valve is calcified. Aortic valve  regurgitation is not visualized. Mild to moderate aortic stenosis is  present. Aortic valve mean gradient measures 25.0 mmHg. Aortic valve peak  gradient measures 39.1 mmHg. Aortic valve  area, by VTI measures 1.32 cm.   Pulmonic Valve: The pulmonic valve was grossly normal. Pulmonic valve  regurgitation is not visualized.   Aorta: The aortic root is normal in size and structure.   Venous: The pulmonary veins were not well visualized. The inferior vena  cava was not well visualized.   IAS/Shunts: The atrial septum is grossly normal.     LEFT VENTRICLE  PLAX 2D  LVIDd:     5.90 cm   Diastology  LVIDs:     5.30 cm   LV e' medial:  3.99 cm/s  LV PW:     1.60 cm   LV E/e' medial: 20.7  LV IVS:    1.30 cm   LV e' lateral:  4.12 cm/s  LVOT diam:   2.45 cm   LV E/e' lateral: 20.0  LV SV:     80  LV SV Index:  40  LVOT Area:   4.71 cm   Laboratory Data:  High Sensitivity Troponin:  No results for input(s): TROPONINIHS in the last 720 hours.   Chemistry Recent Labs  Lab 02/20/20 1243 02/20/20 1305  NA 137 141  K 3.8 3.9  CL 103 103  CO2 26   --   GLUCOSE 104* 102*  BUN 22 23  CREATININE 0.92 1.00  CALCIUM 8.9  --   GFRNONAA >60  --   ANIONGAP 8  --     Recent Labs  Lab 02/20/20 1243  PROT 7.4  ALBUMIN 4.5  AST 21  ALT 15  ALKPHOS 68  BILITOT 0.9   Hematology Recent Labs  Lab 02/20/20 1243 02/20/20 1305  WBC 6.4  --   RBC 5.01  --   HGB 15.9 16.3  HCT 47.5 48.0  MCV 94.8  --   MCH 31.7  --   MCHC 33.5  --   RDW 12.7  --   PLT 247  --    BNPNo results for input(s): BNP, PROBNP in the last 168 hours.  DDimer No results for input(s): DDIMER in the last 168 hours.   Radiology/Studies:  CT Code Stroke CTA Head W/WO contrast  Result Date: 02/21/2020 CLINICAL DATA:  Stroke workup EXAM: CT ANGIOGRAPHY HEAD AND NECK TECHNIQUE: Multidetector CT imaging of the head and neck was performed using the standard protocol during bolus administration of intravenous contrast. Multiplanar CT image reconstructions and MIPs were obtained to evaluate the vascular anatomy. Carotid  stenosis measurements (when applicable) are obtained utilizing NASCET criteria, using the distal internal carotid diameter as the denominator. CONTRAST:  57mL OMNIPAQUE IOHEXOL 350 MG/ML SOLN COMPARISON:  CT and brain MRI from yesterday. FINDINGS: CTA NECK FINDINGS Aortic arch: Atheromatous plaque.  Three vessel branching. Right carotid system: Scattered mainly calcified atheromatous plaque primarily at the bifurcation. No flow limiting stenosis, ulceration, or beading. Left carotid system: Calcified plaque primarily at the bifurcation/bulb with at least 70% stenosis as measured on sagittal reformats. No ulceration or beading. Vertebral arteries: Proximal subclavian atherosclerosis without flow limiting stenosis. The left vertebral artery is dominant. Vertebral arteries are smooth and widely patent to the dura. Skeleton: Advanced, generalized disc and facet degeneration with C3-4 anterolisthesis. Partially covered left glenohumeral joint with intra-articular  bodies in the medial recess. Other neck: No masslike or inflammatory finding seen. Upper chest: No acute finding Review of the MIP images confirms the above findings CTA HEAD FINDINGS Anterior circulation: Calcified plaque along the carotid siphons. No branch occlusion, beading, or proximal flow limiting stenosis. Negative for aneurysm. Posterior circulation: Left dominant vertebral artery. Patent bilateral PICA. Patent bilateral superior cerebellar arteries with symmetric enhancement. A faint right AICA is present. Advanced right P2 segment stenosis. High-grade left P3 segment stenosis. No beading or aneurysm. Venous sinuses: Unremarkable in the arterial phase Anatomic variants: None significant Review of the MIP images confirms the above findings IMPRESSION: 1. No emergent finding.  No evident cerebellar branch occlusion. 2. At least 70% left ICA origin stenosis. 3. High-grade right P2 and left P3 atheromatous stenoses. Electronically Signed   By: Monte Fantasia M.D.   On: 02/21/2020 08:13   CT HEAD WO CONTRAST  Result Date: 02/20/2020 CLINICAL DATA:  Slurred speech, blurred vision. EXAM: CT HEAD WITHOUT CONTRAST TECHNIQUE: Contiguous axial images were obtained from the base of the skull through the vertex without intravenous contrast. COMPARISON:  May 24, 2019. FINDINGS: Brain: Mild diffuse cortical atrophy is noted. Mild chronic ischemic white matter disease is noted. No mass effect or midline shift is noted. Ventricular size is within normal limits. There is no evidence of mass lesion or hemorrhage. Low density is noted in the region of the right cerebellar peduncle concerning for infarction of indeterminate age. Vascular: No hyperdense vessel or unexpected calcification. Skull: Normal. Negative for fracture or focal lesion. Sinuses/Orbits: No acute finding. Other: None. IMPRESSION: Mild diffuse cortical atrophy. Mild chronic ischemic white matter disease. Low density is noted in the region of the right  cerebellar peduncle concerning for infarction of indeterminate age. MRI is recommended for further evaluation. Electronically Signed   By: Marijo Conception M.D.   On: 02/20/2020 13:12   CT Code Stroke CTA Neck W/WO contrast  Result Date: 02/21/2020 CLINICAL DATA:  Stroke workup EXAM: CT ANGIOGRAPHY HEAD AND NECK TECHNIQUE: Multidetector CT imaging of the head and neck was performed using the standard protocol during bolus administration of intravenous contrast. Multiplanar CT image reconstructions and MIPs were obtained to evaluate the vascular anatomy. Carotid stenosis measurements (when applicable) are obtained utilizing NASCET criteria, using the distal internal carotid diameter as the denominator. CONTRAST:  58mL OMNIPAQUE IOHEXOL 350 MG/ML SOLN COMPARISON:  CT and brain MRI from yesterday. FINDINGS: CTA NECK FINDINGS Aortic arch: Atheromatous plaque.  Three vessel branching. Right carotid system: Scattered mainly calcified atheromatous plaque primarily at the bifurcation. No flow limiting stenosis, ulceration, or beading. Left carotid system: Calcified plaque primarily at the bifurcation/bulb with at least 70% stenosis as measured on sagittal reformats. No ulceration or  beading. Vertebral arteries: Proximal subclavian atherosclerosis without flow limiting stenosis. The left vertebral artery is dominant. Vertebral arteries are smooth and widely patent to the dura. Skeleton: Advanced, generalized disc and facet degeneration with C3-4 anterolisthesis. Partially covered left glenohumeral joint with intra-articular bodies in the medial recess. Other neck: No masslike or inflammatory finding seen. Upper chest: No acute finding Review of the MIP images confirms the above findings CTA HEAD FINDINGS Anterior circulation: Calcified plaque along the carotid siphons. No branch occlusion, beading, or proximal flow limiting stenosis. Negative for aneurysm. Posterior circulation: Left dominant vertebral artery. Patent  bilateral PICA. Patent bilateral superior cerebellar arteries with symmetric enhancement. A faint right AICA is present. Advanced right P2 segment stenosis. High-grade left P3 segment stenosis. No beading or aneurysm. Venous sinuses: Unremarkable in the arterial phase Anatomic variants: None significant Review of the MIP images confirms the above findings IMPRESSION: 1. No emergent finding.  No evident cerebellar branch occlusion. 2. At least 70% left ICA origin stenosis. 3. High-grade right P2 and left P3 atheromatous stenoses. Electronically Signed   By: Monte Fantasia M.D.   On: 02/21/2020 08:13   MR BRAIN WO CONTRAST  Result Date: 02/20/2020 CLINICAL DATA:  Right-sided weakness, slurred speech, blurry vision EXAM: MRI HEAD WITHOUT CONTRAST TECHNIQUE: Multiplanar, multiecho pulse sequences of the brain and surrounding structures were obtained without intravenous contrast. COMPARISON:  05/24/2019 FINDINGS: Brain: There is area of mildly reduced diffusion within the right brachium pontis measuring approximately 1.4 cm. There is corresponding T2 hyperintensity with rounded appearance. Additional patchy and confluent areas of T2 hyperintensity in the supratentorial white matter are nonspecific but probably reflect moderate to advanced chronic microvascular ischemic changes. Small chronic right cerebellar infarct. There is no intracranial hemorrhage. There is no intracranial mass or significant mass effect. There is no hydrocephalus or extra-axial fluid collection. Prominence of the ventricles and sulci reflects generalized parenchymal volume loss similar to the prior study. Vascular: Major vessel flow voids at the skull base are preserved. Skull and upper cervical spine: Normal marrow signal is preserved. Sinuses/Orbits: Minor mucosal thickening. Bilateral lens replacements. Other: Sella is unremarkable.  Mastoid air cells are clear. IMPRESSION: Area of abnormal signal within the right middle cerebellar  peduncle may reflect acute to subacute infarct. Given rounded appearance, follow-up is recommended ensure appropriate evolution (suggest addition of post contrast imaging at that time). Moderate to advanced chronic microvascular ischemic changes. Small chronic right cerebellar infarct. Electronically Signed   By: Macy Mis M.D.   On: 02/20/2020 15:44   MR BRAIN W CONTRAST  Result Date: 02/20/2020 CLINICAL DATA:  Right middle cerebellar peduncle lesion. EXAM: MRI HEAD WITH CONTRAST TECHNIQUE: Multiplanar, multiecho pulse sequences of the brain and surrounding structures were obtained with intravenous contrast. CONTRAST:  61mL GADAVIST GADOBUTROL 1 MMOL/ML IV SOLN COMPARISON:  Brain MRI without contrast 02/20/2020 FINDINGS: There is no abnormal contrast enhancement at the site of the right middle cerebellar peduncle lesion. There is a punctate focus of contrast enhancement within the posterior right cerebellar hemisphere (series 5, image 43) that is favored to be vascular. IMPRESSION: 1. No abnormal contrast enhancement at the site of the right middle cerebellar peduncle lesion. 2. Punctate focus of enhancement in the posterior right cerebellar hemispheres favored to be vascular, but attention on follow-up scan in 6-12 weeks recommended. Electronically Signed   By: Ulyses Jarred M.D.   On: 02/20/2020 19:19   DG CHEST PORT 1 VIEW  Result Date: 02/20/2020 CLINICAL DATA:  CVA EXAM: PORTABLE CHEST 1 VIEW  COMPARISON:  10/31/2003 FINDINGS: Single frontal view of the chest demonstrates mild enlargement the cardiac silhouette, accentuated by portable AP technique. No airspace disease, effusion, or pneumothorax. Significant bilateral shoulder osteoarthritis. No acute fracture. IMPRESSION: 1. Enlarged cardiac silhouette. 2. No acute airspace disease. Electronically Signed   By: Randa Ngo M.D.   On: 02/20/2020 23:50   ECHOCARDIOGRAM COMPLETE  Result Date: 02/21/2020    ECHOCARDIOGRAM REPORT   Patient Name:    CALLIE BUNYARD Date of Exam: 02/21/2020 Medical Rec #:  [id number]      Height:       67.0 in Accession #:    [id number]     Weight:       191.2 lb Date of Birth:  11/17/1932       BSA:          1.984 m Patient Age:    43 years       BP:           154/104 mmHg Patient Gender: M              HR:           97 bpm. Exam Location:  Inpatient Procedure: 2D Echo, Cardiac Doppler and Color Doppler Indications:    Stroke  History:        Patient has no prior history of Echocardiogram examinations.                 Stroke, Signs/Symptoms:Dyspnea; Risk Factors:Hypertension.  Sonographer:    Roseanna Rainbow RDCS Referring Phys: [place]  Sonographer Comments: Technically difficult study due to poor echo windows. Patient did not have IV access, and refused to get one. Definity not used. IMPRESSIONS  1. Left ventricular ejection fraction, by estimation, is 30 to 35%. The left ventricle has moderately decreased function. The left ventricle demonstrates global hypokinesis. The left ventricular internal cavity size was mildly dilated. There is severe concentric left ventricular hypertrophy. Left ventricular diastolic parameters are indeterminate.  2. Right ventricular systolic function is normal. The right ventricular size is normal.  3. Left atrial size was severely dilated.  4. The mitral valve is grossly normal. Mild mitral valve regurgitation.  5. The aortic valve is calcified. Aortic valve regurgitation is not visualized. Mild to moderate aortic valve stenosis. Aortic valve mean gradient measures 25.0 mmHg. Comparison(s): No prior Echocardiogram. Conclusion(s)/Recommendation(s): LV function is reduced. There is at least mild to moderate aortic stenosis; this may be underestimated in the setting of LV function. FINDINGS  Left Ventricle: LVMI 197 g/m2 RWT 0.54. Left ventricular ejection fraction, by estimation, is 30 to 35%. The left ventricle has moderately decreased function. The left ventricle demonstrates global  hypokinesis. The left ventricular internal cavity size was mildly dilated. There is severe concentric left ventricular hypertrophy. Left ventricular diastolic parameters are indeterminate. Right Ventricle: The right ventricular size is normal. No increase in right ventricular wall thickness. Right ventricular systolic function is normal. Left Atrium: Left atrial size was severely dilated. Right Atrium: Right atrial size was normal in size. Pericardium: There is no evidence of pericardial effusion. Mitral Valve: The mitral valve is grossly normal. There is mild thickening of the mitral valve leaflet(s). Mild to moderate mitral annular calcification. Mild mitral valve regurgitation. Tricuspid Valve: The tricuspid valve is grossly normal. Tricuspid valve regurgitation is not demonstrated. Aortic Valve: LV SVI 38. The aortic valve is calcified. Aortic valve regurgitation is not visualized. Mild to moderate aortic stenosis is present. Aortic valve mean gradient measures 25.0  mmHg. Aortic valve peak gradient measures 39.1 mmHg. Aortic valve area, by VTI measures 1.32 cm. Pulmonic Valve: The pulmonic valve was grossly normal. Pulmonic valve regurgitation is not visualized. Aorta: The aortic root is normal in size and structure. Venous: The pulmonary veins were not well visualized. The inferior vena cava was not well visualized. IAS/Shunts: The atrial septum is grossly normal.  LEFT VENTRICLE PLAX 2D LVIDd:         5.90 cm      Diastology LVIDs:         5.30 cm      LV e' medial:    3.99 cm/s LV PW:         1.60 cm      LV E/e' medial:  20.7 LV IVS:        1.30 cm      LV e' lateral:   4.12 cm/s LVOT diam:     2.45 cm      LV E/e' lateral: 20.0 LV SV:         80 LV SV Index:   40 LVOT Area:     4.71 cm  LV Volumes (MOD) LV vol d, MOD A2C: 165.0 ml LV vol d, MOD A4C: 119.5 ml LV vol s, MOD A2C: 111.0 ml LV vol s, MOD A4C: 76.0 ml LV SV MOD A2C:     54.0 ml LV SV MOD A4C:     119.5 ml LV SV MOD BP:      48.8 ml RIGHT  VENTRICLE             IVC RV S prime:     18.00 cm/s  IVC diam: 3.10 cm TAPSE (M-mode): 2.3 cm LEFT ATRIUM             Index       RIGHT ATRIUM           Index LA diam:        4.20 cm 2.12 cm/m  RA Area:     12.20 cm LA Vol (A2C):   70.5 ml 35.54 ml/m RA Volume:   24.90 ml  12.55 ml/m LA Vol (A4C):   94.0 ml 47.38 ml/m LA Biplane Vol: 85.4 ml 43.05 ml/m  AORTIC VALVE AV Area (Vmax):    1.22 cm AV Area (Vmean):   1.08 cm AV Area (VTI):     1.32 cm AV Vmax:           312.75 cm/s AV Vmean:          236.500 cm/s AV VTI:            0.608 m AV Peak Grad:      39.1 mmHg AV Mean Grad:      25.0 mmHg LVOT Vmax:         80.90 cm/s LVOT Vmean:        54.400 cm/s LVOT VTI:          0.170 m LVOT/AV VTI ratio: 0.28  AORTA Ao Root diam: 3.20 cm Ao Asc diam:  3.10 cm MITRAL VALVE MV Area (PHT): 4.97 cm    SHUNTS MV Decel Time: 153 msec    Systemic VTI:  0.17 m MV E velocity: 82.53 cm/s  Systemic Diam: 2.45 cm Rudean Haskell MD Electronically signed by Rudean Haskell MD Signature Date/Time: 02/21/2020/4:33:50 PM    Final      Assessment and Plan:   1. Cardiomyopathy found with CVA work up *** chest pain and dyspnea - prior  tele strips and EKGs with PVCs *** 2. CVA Rt cerebellar Peduncle stroke pt for DAPT for 3 weeks followed by ASA alone.  3. Carotid disease by CT angio of head and neck Left ICA stenosis 70% - to follow with vascular 4. HLD on zetia and statin added  5. HTN was on amlodipine - would be beneficial to add low dose BB for CM and PVCs  Also low dose ARB if BP permits   {Complete the following score calculators/questions which help meet required metrics.  Press F2         :[id number]}               For questions or updates, please contact [place] Please consult [email or web address] for contact info under    Signed, Cecilie Kicks, NP  02/22/2020 2:24 PM

## 2020-02-22 NOTE — Evaluation (Signed)
Speech Language Pathology Evaluation Patient Details Name: Ruben Barrera MRN: [id number] DOB: December 19, 1932 Today's Date: 02/22/2020 Time: 84-3151 SLP Time Calculation (min) (ACUTE ONLY): 27 min  Problem List:  Patient Active Problem List   Diagnosis Date Noted  . Mixed hyperlipidemia   . Tobacco abuse   . Depression   . Acute stroke due to occlusion of right cerebellar artery ([place]) 02/20/2020  . Erythrocytosis 02/20/2020  . BPH (benign prostatic hyperplasia) 02/20/2020  . HNP (herniated nucleus pulposus), lumbar 01/13/2016  . Lumbar stenosis with neurogenic claudication 08/28/2015  . DOE (dyspnea on exertion) 05/01/2014  . Essential hypertension 05/01/2014  . Left shoulder pain 05/05/2011   Past Medical History:  Past Medical History:  Diagnosis Date  . Arthritis   . Complication of anesthesia    "had the shivers" after surgery  . Depression   . Dysrhythmia    "extra heart beat"  . GERD (gastroesophageal reflux disease)   . History of hiatal hernia   . Hypercholesteremia   . Hypertension   . Lumbar spinal stenosis   . RLS (restless legs syndrome)    takes ativan as needed   Past Surgical History:  Past Surgical History:  Procedure Laterality Date  . BACK SURGERY    . LUMBAR LAMINECTOMY/DECOMPRESSION MICRODISCECTOMY N/A 08/28/2015   Procedure: Lumbar Four-Five decompressive lumbar laminectomy;  Surgeon: Jovita Gamma, MD;  Location: [facility];  Service: Neurosurgery;  Laterality: N/A;  . REPLACEMENT TOTAL KNEE BILATERAL Bilateral 11/05/2003  . TOE AMPUTATION Right 2010   second toe  . TONSILLECTOMY     HPI:   86 year old right-handed male with history of hypertension, chronic back pain with history of lumbar decompression maintained on Celebrex as well as Neurontin with baclofen and hydrocodone as needed, hyperlipidemia and tobacco abuse.  Presented 02/20/2020 with dysarthria and left-sided weakness.  CT of the head showed mild diffuse cortical atrophy.  Low density  noted in the region of the right cerebellar peduncle concerning for infarction of indeterminate age.  Patient did not receive TPA.  MRI showed area of abnormal signal within the right middle cerebellar peduncle.  CT angiogram of head and neck no emergent findings no evidence of cerebellar branch occlusion.  At least 70% left ICA origin stenosis; SLE generated.   Assessment / Plan / Recommendation Clinical Impression  Pt presents with mild expressive aphasia characterized by anomia intermittently during complex conversation with inconsistent dysfluency noted and infrequent semantic paraphasias evident within communicative interactions.   He was administered the SLUMS ([place] Mental Status Examination) with a score obtained of 20/26 with graphic expression subtest eliminated d/t pt difficulty with legibility stating "I can't write well since this happened."  Typical score on this assessment is 27/30.  Deficits noted in attention, memory, thought organization and safety awareness.  Pt stated he "didn't need anyone to help him to the bathroom, he could hold on to things to go."  Graphic expression will be evaluated further with initiation of ST for expressive aphasia/cognitive deficits. Recommend CIR.  Thank you for this referral.    SLP Assessment  SLP Recommendation/Assessment: Patient needs continued Speech Language Pathology Services SLP Visit Diagnosis: Aphasia (R47.01);Cognitive communication deficit (R41.841)    Follow Up Recommendations  Inpatient Rehab    Frequency and Duration min 2x/week  1 week      SLP Evaluation Cognition  Overall Cognitive Status: Impaired/Different from baseline Arousal/Alertness: Awake/alert Orientation Level: Oriented X4 Attention: Sustained Sustained Attention: Impaired Sustained Attention Impairment: Verbal basic;Functional basic Memory: Impaired Memory  Impairment: Retrieval deficit;Decreased short term memory Decreased Short Term Memory:  Verbal basic;Functional basic Immediate Memory Recall: Sock;Blue;Bed Memory Recall Sock: Without Cue Memory Recall Blue: Without Cue Memory Recall Bed: With Cue Awareness: Impaired Awareness Impairment: Anticipatory impairment Problem Solving: Appears intact Executive Function: Organizing;Self Monitoring Organizing: Impaired Organizing Impairment: Verbal basic;Functional basic Self Monitoring: Impaired Self Monitoring Impairment: Verbal basic;Functional basic Behaviors: Impulsive Safety/Judgment: Impaired Comments: Pt stated "I can get up and go to the bathroom without help"       Comprehension  Auditory Comprehension Overall Auditory Comprehension: Appears within functional limits for tasks assessed Yes/No Questions: Within Functional Limits Commands: Within Functional Limits Conversation: Complex Interfering Components: Working Field seismologist: Repetition Retail banker: Within Function Limits Reading Comprehension Reading Status: Within funtional limits (for environmental signs; full assessment may be beneficial)    Expression Expression Primary Mode of Expression: Verbal Verbal Expression Overall Verbal Expression: Impaired Level of Generative/Spontaneous Verbalization: Conversation Repetition: No impairment Naming: Impairment Responsive: 76-100% accurate Confrontation: Within functional limits Convergent: 50-74% accurate Divergent: 50-74% accurate Other Naming Comments: semantic paraphasias Verbal Errors: Semantic paraphasias;Phonemic paraphasias;Aware of errors Non-Verbal Means of Communication: Not applicable Written Expression Dominant Hand: Right Written Expression: Unable to assess (comment) (Pt stated "I can't write like before")   Oral / Motor  Oral Motor/Sensory Function Overall Oral Motor/Sensory Function: Within functional limits Motor Speech Overall Motor Speech: Appears within functional limits for tasks  assessed Respiration: Within functional limits Phonation: Normal Resonance: Within functional limits Articulation: Within functional limitis Intelligibility: Intelligible Motor Planning: Witnin functional limits Motor Speech Errors: Not applicable                      Elvina Sidle, M.S., CCC-SLP 02/22/2020, 1:23 PM

## 2020-02-22 NOTE — H&P (Signed)
Physical Medicine and Rehabilitation Admission H&P       Chief Complaint  Patient presents with  . Blurred Vision  . Aphasia  : HPI: Ruben Barrera is an 86 year old right-handed male with history of hypertension, chronic back pain with history of lumbar decompression maintained on Celebrex as well as Neurontin with baclofen and hydrocodone as needed, hyperlipidemia and tobacco abuse.  Per chart review patient lives with spouse 1 level home 3 steps to entry.  Independent with assistive device and still driving.  Presented 02/20/2020 with dysarthria and left-sided weakness.  CT of the head showed mild diffuse cortical atrophy.  Low density noted in the region of the right cerebellar peduncle concerning for infarction of indeterminate age.  Patient did not receive TPA.  MRI showed area of abnormal signal within the right middle cerebellar peduncle.  CT angiogram of head and neck no emergent findings no evidence of cerebellar branch occlusion.  At least 70% left ICA origin stenosis.  Echocardiogram with ejection fraction of 30 to 35%.  Noted severe concentric left ventricular hypertrophy.  Currently maintained on aspirin and Plavix for CVA prophylaxis x3 weeks then aspirin alone.  Tolerating a regular diet.  Therapy evaluations completed and patient was admitted for a comprehensive rehab program.   Pt reports has chronic back pain- was supposed to get [facility] stimulator testing done in the next 1 month- for chronic back pain.  Has spasms in back- also feels like having difficulties coming up with words- that's new since CVA. Wants SLP when brought to CIR.  LBM was yesterday- denies constipation.  Voiding well.  No dysphagia.   Review of Systems  Constitutional: Positive for malaise/fatigue. Negative for fever.  HENT: Negative for hearing loss.   Eyes: Negative for blurred vision and double vision.  Respiratory: Negative for cough.        Shortness of breath with increased exertion   Cardiovascular: Positive for leg swelling. Negative for chest pain and palpitations.  Gastrointestinal: Positive for constipation. Negative for heartburn, nausea and vomiting.       GERD  Genitourinary: Positive for urgency. Negative for dysuria and hematuria.  Musculoskeletal: Positive for back pain and myalgias.  Skin: Negative for rash.  Neurological: Positive for focal weakness.       Restless leg syndrome  Psychiatric/Behavioral: Positive for depression.  All other systems reviewed and are negative.      Past Medical History:  Diagnosis Date  . Arthritis   . Complication of anesthesia    "had the shivers" after surgery  . Depression   . Dysrhythmia    "extra heart beat"  . GERD (gastroesophageal reflux disease)   . History of hiatal hernia   . Hypercholesteremia   . Hypertension   . Lumbar spinal stenosis   . RLS (restless legs syndrome)    takes ativan as needed        Past Surgical History:  Procedure Laterality Date  . BACK SURGERY    . LUMBAR LAMINECTOMY/DECOMPRESSION MICRODISCECTOMY N/A 08/28/2015   Procedure: Lumbar Four-Five decompressive lumbar laminectomy;  Surgeon: Jovita Gamma, MD;  Location: [facility];  Service: Neurosurgery;  Laterality: N/A;  . REPLACEMENT TOTAL KNEE BILATERAL Bilateral 11/05/2003  . TOE AMPUTATION Right 2010   second toe  . TONSILLECTOMY          Family History  Problem Relation Age of Onset  . Stroke Mother   . Hypertension Other        family history   Social  History:  reports that he has been smoking cigars. He has never used smokeless tobacco. He reports current alcohol use. He reports that he does not use drugs. Allergies:       Allergies  Allergen Reactions  . Simvastatin Other (See Comments)    Leg pains   . Other Other (See Comments)    UNSPECIFIED REACTION  Reaction to anesthesia per patient         Medications Prior to Admission  Medication Sig Dispense Refill  . amLODipine  (NORVASC) 2.5 MG tablet Take 2.5 mg by mouth every evening.     . baclofen (LIORESAL) 10 MG tablet Take 10 mg by mouth 3 (three) times daily as needed for muscle spasms.     . celecoxib (CELEBREX) 200 MG capsule Take 200 mg by mouth daily.     . citalopram (CELEXA) 10 MG tablet Take 10 mg by mouth daily.    Marland Kitchen ezetimibe (ZETIA) 10 MG tablet Take 10 mg by mouth every evening.     . fluticasone (FLONASE) 50 MCG/ACT nasal spray Place 1 spray into both nostrils daily as needed for allergies.     Marland Kitchen gabapentin (NEURONTIN) 400 MG capsule Take 400 mg by mouth daily.    Marland Kitchen gabapentin (NEURONTIN) 600 MG tablet Take 600 mg by mouth 3 (three) times daily.    Marland Kitchen HYDROcodone-acetaminophen (NORCO) 7.5-325 MG tablet Take 1 tablet by mouth every 8 (eight) hours as needed for moderate pain.    Marland Kitchen LORazepam (ATIVAN) 1 MG tablet Take 1 mg by mouth daily.     . MULTIPLE VITAMIN PO Take 1 tablet by mouth daily.     Marland Kitchen omeprazole (PRILOSEC) 20 MG capsule Take 20 mg by mouth daily.    . tamsulosin (FLOMAX) 0.4 MG CAPS capsule Take 0.4 mg by mouth daily.     Marland Kitchen HYDROcodone-acetaminophen (NORCO/VICODIN) 5-325 MG tablet Take 1-2 tablets by mouth every 4 (four) hours as needed (mild pain). (Patient not taking: Reported on 02/20/2020) 60 tablet 0  . HYDROcodone-acetaminophen (NORCO/VICODIN) 5-325 MG tablet Take 1-2 tablets by mouth every 4 (four) hours as needed (pain). (Patient not taking: Reported on 02/20/2020) 60 tablet 0    Drug Regimen Review Drug regimen was reviewed and remains appropriate with no significant issues identified  Home: Home Living Family/patient expects to be discharged to:: Private residence Living Arrangements: Spouse/significant other Available Help at Discharge: Available 24 hours/day Type of Home: House Home Access: Stairs to enter [employer] of Steps: 3 Entrance Stairs-Rails: Right (Ascending) Home Layout: One level Bathroom Shower/Tub: Gaffer,  Door [employer] Toilet: Standard Bathroom Accessibility: Yes Home Equipment: Other (comment), Wheelchair - manual, Hand held shower head, Grab bars - tub/shower, Walker - 2 wheels (Hurry cane, 3-wheeled walker w/o seat)   Functional History: Prior Function Level of Independence: Independent with assistive device(s) Comments: Independent with all mobility with use of hurrycane in R hand and int 3-wheeled walker in morning. Patient was still driving.   Functional Status:  Mobility: Bed Mobility Overal bed mobility: Needs Assistance Bed Mobility: Supine to Sit, Sit to Supine Supine to sit: Supervision Sit to supine: Supervision General bed mobility comments: Pt able to transition supine <> sit with quick movements with bed flat and no use of bed rails, supervision for safety. Transfers Overall transfer level: Needs assistance Equipment used: Straight cane Transfers: Sit to/from Stand Sit to Stand: Min assist Stand pivot transfers: Min guard, Min assist General transfer comment: Pt impulsive in coming to stand when directed to  remain seated, minA for steadying dur to trunk sway. Ambulation/Gait Ambulation/Gait assistance: Mod assist, Min assist Gait Distance (Feet): 100 Feet Assistive device: Straight cane Gait Pattern/deviations: Step-through pattern, Decreased stride length, Narrow base of support General Gait Details: Pt ambulates with narrow BOS and intermittent L toe drag resulting in bouts of LOB and therefore min-modA to recover his balance during LOB bouts. Pt does not utilize [facility] apporpriately with gait as he sometimes carries it. Gait velocity: decreased Gait velocity interpretation: 1.31 - 2.62 ft/sec, indicative of limited community ambulator Stairs: Yes Stairs assistance: Min assist, Mod assist Stair Management: One rail Right, One rail Left, Alternating pattern Number of Stairs: 4 General stair comments: Ascending with use of R hand rail and descending with L, requiring  repeated cues for proper hand placement to simulate home. Displays sequencing and coordination deficits with poor R foot clearance, placing him at risk for falls and him requiring min-modA to maintain his safety. Several mini-tripping bouts of stairs due to poor foot clearance. Extensive cues provided to improve deficits, with no success.  ADL: ADL Overall ADL's : Needs assistance/impaired Grooming: Wash/dry hands, Oral care, Min guard, Standing Grooming Details (indicate cue type and reason): Patient with difficulty organizing and sequencing oral hygiene and hand washing tasks requiring verbal cues. Patient very impulsive with low frustration tolerance. Min guard to steady without use of AD.  Lower Body Dressing: Min guard, Sit to/from stand Lower Body Dressing Details (indicate cue type and reason): Supervision A to doff/don footwear and Min guard to don LB clothing in sitting/standing for safety 2/2 impulsivity and decreased safety awareness.  Toilet Transfer: Magazine features editor Details (indicate cue type and reason): Min guard for safety 2/2 impulsivity and decreased safety awareness.  Functional mobility during ADLs: Min guard General ADL Comments: Min guard with short-distance ambulation in room 2/2 decreased safety awareness and impulsivity.   Cognition: Cognition Overall Cognitive Status: Impaired/Different from baseline Orientation Level: Oriented to person, Oriented to place, Oriented to time Cognition Arousal/Alertness: Awake/alert Behavior During Therapy: Agitated, Restless, Impulsive Overall Cognitive Status: Impaired/Different from baseline Area of Impairment: Attention, Memory, Safety/judgement, Awareness, Following commands, Problem solving Current Attention Level: Selective Memory: Decreased short-term memory Following Commands: Follows one step commands inconsistently, Follows one step commands with increased time Safety/Judgement: Decreased awareness of safety,  Decreased awareness of deficits Awareness: Emergent Problem Solving: Slow processing, Difficulty sequencing, Requires verbal cues, Requires tactile cues General Comments: Pt impulsive and requires extensive cues to maintain safety through providing single step commands to stop regularly and listen to directions. Pt agitated and refuses to follow cues to not get out of chair without assistance. LOB and tripping on R foot during session with pt in denial of this placing him at risk for falls and injury. A&Ox4. Wife states that her husband is not normally this agitated and resistant or unsafe.  Physical Exam: Blood pressure (!) 152/86, pulse 88, temperature (!) 97.3 F (36.3 C), temperature source Oral, resp. rate 17, weight 84 kg, SpO2 91 %. Physical Exam Vitals and nursing note reviewed. Exam conducted with a chaperone present.  Constitutional:      Comments: Pt sitting up casually in bedside chair, wife at bedside, wearing eyeglasses, NAD- was shifting some due to back pain.   HENT:     Head: Normocephalic and atraumatic.     Comments: Smile equal; tongue midline to a little R deviation    Right Ear: External ear normal.     Left Ear: External ear normal.  Nose: Nose normal. No congestion or rhinorrhea.     Mouth/Throat:     Mouth: Mucous membranes are moist.     Pharynx: Oropharynx is clear. No oropharyngeal exudate.  Eyes:     General:        Right eye: No discharge.        Left eye: No discharge.     Extraocular Movements: Extraocular movements intact.     Comments: No nystagmus seen  Cardiovascular:     Comments: RRR- no JVD Pulmonary:     Comments: CTA B/L- no W/R/R- good air movement Abdominal:     Comments: Soft, NT, ND, (+)BS   Musculoskeletal:     Cervical back: Normal range of motion. No rigidity.     Comments: UEs 5/5 in deltoids, biceps, triceps, WE, grip and finger abd B/L- maybe slightly weak in L finger abduction? LEs- 5/5 in HF, KE, KF, DF and PF B/L  Skin:     Comments: Sun spots on UEs >LEs- don't see an IV- no skin breakdown on buttocks/heels  Neurological:     Comments: Patient is alert in no acute distress makes good eye contact with examiner and follows commands.  He is oriented x3. Ox3- light touch intact x 4 extremities Had some nonfluent speech occ- mild- appeared to be word searching occ.   Psychiatric:     Comments: Insists will stay in CIR 4-5 days- insistent       Medical Problem List and Plan: 1.  R sided cerebellar issues with gait and balance issues secondary to right cerebellar peduncle infarction secondary to small vessel disease             -patient may shower             -ELOS/Goals: mod I- 7-9 days, however pt insists 4-5 days max, of note.  2.  Antithrombotics: -DVT/anticoagulation: SCDs             -antiplatelet therapy: Aspirin 81 mg daily and Plavix 75 mg daily x3 weeks then aspirin alone 3. Pain Management/chronic back pain: Neurontin 600 mg 3 times daily, hydrocodone as needed 4. Mood: Ativan 1 mg every 8 hours as needed anxiety provide emotional support             -antipsychotic agents: N/A 5. Neuropsych: This patient is capable of making decisions on his own behalf. 6. Skin/Wound Care: Routine skin checks 7. Fluids/Electrolytes/Nutrition: Routine in and outs with follow-up chemistries 8.  Cardiomyopathy.  Ejection fraction 30 to 35%.  Follow-up cardiology services 9.  Carotid stenosis.  Left ICA origin 70% stenosis.  Recommend vascular outpatient follow-up 10.  Permissive hypertension.  Patient on Norvasc 2.5 mg daily prior to admission.  Resume as needed 11.  BPH.  Flomax 0.4 mg daily. Voiding well 12.  GERD.  Protonix 13.  Hyperlipidemia.  Zetia/Pravachol 14.Tobacco abuse.Counseling 15. Word finding issues- pt specifically asking for SLP to work on this issue.   Lavon Paganini Angiulli, PA-C 02/22/2020   I have personally performed a face to face diagnostic evaluation of this patient and formulated the  key components of the plan. Additionally, I have personally reviewed laboratory data, imaging studies, as well as relevant notes and concur with the physician assistant's documentation above.  The patient's status has not changed from the original H&P. Any changes in documentation from the acute care chart have been noted above.

## 2020-02-22 NOTE — Telephone Encounter (Signed)
Will message front desk. Per DR.Jaffe pt okay  to schedule in 4 weeks

## 2020-02-22 NOTE — Progress Notes (Signed)
Ruben Arn, MD  Physician  Physical Medicine and Rehabilitation  Consult Note     Signed  Date of Service:  02/21/2020 12:11 PM      Related encounter: ED to Hosp-Admission (Discharged) from 02/20/2020 in [facility]      Signed      Expand All Collapse All  Show:Clear all [x] Manual[x] Template[] Copied  Added by: [x] Angiulli, Lavon Paganini, PA-C[x] Ruben Arn, MD  [] Hover for details          Physical Medicine and Rehabilitation Consult Reason for Consult: Slurred speech with right side weakness Referring Physician: [facility]      HPI: Ruben Barrera is a 86 y.o. right-handed male with history of hypertension, hyperlipidemia and tobacco abuse.  History taken from chart review, wife, and patient.  Patient lives with spouse.  1 level home 3 steps to entry.  Independent with assistive device and still driving.  He presented on 02/20/2020 with dysarthria and left hemiparesis.  CT of the head showed mild diffuse cortical atrophy.  Low-density noted in the region of the right cerebellar peduncle concerning for infarct of indeterminate age.  Patient did not receive TPA.  MRI showed area of abnormal signal within the right middle cerebellar peduncle.  CT angiogram of head and neck no emergent finding no evidence of cerebellar branch occlusion.  At least 70% left ICA origin stenosis.  Echocardiogram performed, results pending.  Currently maintained on aspirin and Plavix for CVA prophylaxis.  Tolerating a regular diet.  Hospital course further complicated by pain, requiring as needed Norco.  Therapy evaluations completed with recommendations of physical medicine rehab consult.   Review of Systems  Constitutional: Positive for malaise/fatigue. Negative for chills and fever.  HENT: Negative for hearing loss.   Eyes: Negative for blurred vision.  Respiratory: Negative for cough and shortness of breath.   Cardiovascular: Negative for chest pain and leg swelling.   Gastrointestinal: Positive for constipation. Negative for heartburn, nausea and vomiting.       GERD  Genitourinary: Negative for dysuria and hematuria.  Skin: Negative for rash.  Neurological: Positive for focal weakness. Negative for sensory change and speech change.       Restless leg syndrome  Psychiatric/Behavioral: Positive for depression.  All other systems reviewed and are negative.       Past Medical History:  Diagnosis Date  . Arthritis    . Complication of anesthesia      "had the shivers" after surgery  . Depression    . Dysrhythmia      "extra heart beat"  . GERD (gastroesophageal reflux disease)    . History of hiatal hernia    . Hypercholesteremia    . Hypertension    . Lumbar spinal stenosis    . RLS (restless legs syndrome)      takes ativan as needed         Past Surgical History:  Procedure Laterality Date  . BACK SURGERY      . LUMBAR LAMINECTOMY/DECOMPRESSION MICRODISCECTOMY N/A 08/28/2015    Procedure: Lumbar Four-Five decompressive lumbar laminectomy;  Surgeon: Jovita Gamma, MD;  Location: [facility];  Service: Neurosurgery;  Laterality: N/A;  . REPLACEMENT TOTAL KNEE BILATERAL Bilateral 11/05/2003  . TOE AMPUTATION Right 2010    second toe  . TONSILLECTOMY             Family History  Problem Relation Age of Onset  . Stroke Mother    . Hypertension Other  family history    Social History:  reports that he has been smoking cigars. He has never used smokeless tobacco. He reports current alcohol use. He reports that he does not use drugs. Allergies:       Allergies  Allergen Reactions  . Simvastatin Other (See Comments)      Leg pains    . Other Other (See Comments)      UNSPECIFIED REACTION  Reaction to anesthesia per patient          Medications Prior to Admission  Medication Sig Dispense Refill  . amLODipine (NORVASC) 2.5 MG tablet Take 2.5 mg by mouth every evening.       . baclofen (LIORESAL) 10 MG tablet Take 10 mg by  mouth 3 (three) times daily as needed for muscle spasms.       . celecoxib (CELEBREX) 200 MG capsule Take 200 mg by mouth daily.       . citalopram (CELEXA) 10 MG tablet Take 10 mg by mouth daily.      Marland Kitchen ezetimibe (ZETIA) 10 MG tablet Take 10 mg by mouth every evening.       . fluticasone (FLONASE) 50 MCG/ACT nasal spray Place 1 spray into both nostrils daily as needed for allergies.       Marland Kitchen gabapentin (NEURONTIN) 400 MG capsule Take 400 mg by mouth daily.      Marland Kitchen gabapentin (NEURONTIN) 600 MG tablet Take 600 mg by mouth 3 (three) times daily.      Marland Kitchen HYDROcodone-acetaminophen (NORCO) 7.5-325 MG tablet Take 1 tablet by mouth every 8 (eight) hours as needed for moderate pain.      Marland Kitchen LORazepam (ATIVAN) 1 MG tablet Take 1 mg by mouth daily.       . MULTIPLE VITAMIN PO Take 1 tablet by mouth daily.       Marland Kitchen omeprazole (PRILOSEC) 20 MG capsule Take 20 mg by mouth daily.      . tamsulosin (FLOMAX) 0.4 MG CAPS capsule Take 0.4 mg by mouth daily.       Marland Kitchen HYDROcodone-acetaminophen (NORCO/VICODIN) 5-325 MG tablet Take 1-2 tablets by mouth every 4 (four) hours as needed (mild pain). (Patient not taking: Reported on 02/20/2020) 60 tablet 0  . HYDROcodone-acetaminophen (NORCO/VICODIN) 5-325 MG tablet Take 1-2 tablets by mouth every 4 (four) hours as needed (pain). (Patient not taking: Reported on 02/20/2020) 60 tablet 0      Home: Home Living Family/patient expects to be discharged to:: Private residence Living Arrangements: Spouse/significant other Available Help at Discharge: Available 24 hours/day Type of Home: House Home Access: Stairs to enter [employer] of Steps: 3 Entrance Stairs-Rails: Right (Ascending) Home Layout: One level Bathroom Shower/Tub: Gaffer, Door [employer] Toilet: Standard Bathroom Accessibility: Yes Home Equipment: Other (comment), Wheelchair - manual, Hand held shower head, Grab bars - tub/shower, Walker - 2 wheels (Hurry cane, 3-wheeled walker w/o seat)   Functional History: Prior Function Level of Independence: Independent with assistive device(s) Comments: Independent with all mobility with use of hurrycane in R hand and int 3-wheeled walker in morning. Patient was still driving.  Functional Status:  Mobility: Bed Mobility Overal bed mobility: Needs Assistance Bed Mobility: Supine to Sit, Sit to Supine Supine to sit: Supervision Sit to supine: Supervision General bed mobility comments: Pt able to transition supine <> sit with quick movements with bed flat and no use of bed rails, supervision for safety. Transfers Overall transfer level: Needs assistance Equipment used: Straight cane Transfers: Sit to/from Stand Sit to  Stand: Min assist Stand pivot transfers: Min guard, Min assist General transfer comment: Pt impulsive in coming to stand when directed to remain seated, minA for steadying dur to trunk sway. Ambulation/Gait Ambulation/Gait assistance: Mod assist, Min assist Gait Distance (Feet): 100 Feet Assistive device: Straight cane Gait Pattern/deviations: Step-through pattern, Decreased stride length, Narrow base of support General Gait Details: Pt ambulates with narrow BOS and intermittent L toe drag resulting in bouts of LOB and therefore min-modA to recover his balance during LOB bouts. Pt does not utilize [facility] apporpriately with gait as he sometimes carries it. Gait velocity: decreased Gait velocity interpretation: 1.31 - 2.62 ft/sec, indicative of limited community ambulator Stairs: Yes Stairs assistance: Min assist, Mod assist Stair Management: One rail Right, One rail Left, Alternating pattern Number of Stairs: 4 General stair comments: Ascending with use of R hand rail and descending with L, requiring repeated cues for proper hand placement to simulate home. Displays sequencing and coordination deficits with poor R foot clearance, placing him at risk for falls and him requiring min-modA to maintain his safety. Several  mini-tripping bouts of stairs due to poor foot clearance. Extensive cues provided to improve deficits, with no success.   ADL: ADL Overall ADL's : Needs assistance/impaired Grooming: Wash/dry hands, Oral care, Min guard, Standing Grooming Details (indicate cue type and reason): Patient with difficulty organizing and sequencing oral hygiene and hand washing tasks requiring verbal cues. Patient very impulsive with low frustration tolerance. Min guard to steady without use of AD.  Lower Body Dressing: Min guard, Sit to/from stand Lower Body Dressing Details (indicate cue type and reason): Supervision A to doff/don footwear and Min guard to don LB clothing in sitting/standing for safety 2/2 impulsivity and decreased safety awareness.  Toilet Transfer: Magazine features editor Details (indicate cue type and reason): Min guard for safety 2/2 impulsivity and decreased safety awareness.  Functional mobility during ADLs: Min guard General ADL Comments: Min guard with short-distance ambulation in room 2/2 decreased safety awareness and impulsivity.    Cognition: Cognition Overall Cognitive Status: Impaired/Different from baseline Cognition Arousal/Alertness: Awake/alert Behavior During Therapy: Agitated, Restless, Impulsive Overall Cognitive Status: Impaired/Different from baseline Area of Impairment: Attention, Memory, Safety/judgement, Awareness, Following commands, Problem solving Current Attention Level: Selective Memory: Decreased short-term memory Following Commands: Follows one step commands inconsistently, Follows one step commands with increased time Safety/Judgement: Decreased awareness of safety, Decreased awareness of deficits Awareness: Emergent Problem Solving: Slow processing, Difficulty sequencing, Requires verbal cues, Requires tactile cues General Comments: Pt impulsive and requires extensive cues to maintain safety through providing single step commands to stop regularly and  listen to directions. Pt agitated and refuses to follow cues to not get out of chair without assistance. LOB and tripping on R foot during session with pt in denial of this placing him at risk for falls and injury. A&Ox4. Wife states that her husband is not normally this agitated and resistant or unsafe.   Blood pressure (!) 154/104, pulse 84, temperature 97.9 F (36.6 C), temperature source Oral, resp. rate 18, SpO2 97 %. Physical Exam Vitals reviewed.  Constitutional:      General: He is not in acute distress.    Appearance: Normal appearance. He is normal weight.  HENT:     Head: Normocephalic and atraumatic.     Right Ear: External ear normal.     Left Ear: External ear normal.     Nose: Nose normal.  Eyes:     General:  Right eye: Discharge present.        Left eye: No discharge.     Extraocular Movements: Extraocular movements intact.  Cardiovascular:     Heart sounds: Murmur heard.      Comments: ?  Irregular rhythm. Pulmonary:     Effort: Pulmonary effort is normal. No respiratory distress.     Breath sounds: Normal breath sounds.  Abdominal:     General: Abdomen is flat. Bowel sounds are normal. There is no distension.  Musculoskeletal:     Cervical back: Normal range of motion and neck supple.     Comments: Right hallux valgus deformity No edema or tenderness in extremities  Skin:    General: Skin is warm and dry.  Neurological:     Mental Status: He is alert.     Comments: Patient is alert and oriented x3 Makes eye contact with examiner.   Follows commands. Motor: RUE: 5/5 proximal distal RLE: 4+-5/5 proximal distal LUE/LLE: 5/5 proximal distally Sensation intact light touch  Psychiatric:        Mood and Affect: Mood normal.        Behavior: Behavior normal.        Thought Content: Thought content normal.        Lab Results Last 24 Hours       Results for orders placed or performed during the hospital encounter of 02/20/20 (from the past 24  hour(s))  Ethanol     Status: None    Collection Time: 02/20/20 12:43 PM  Result Value Ref Range    Alcohol, Ethyl (B) <10 <10 mg/dL  Protime-INR     Status: None    Collection Time: 02/20/20 12:43 PM  Result Value Ref Range    Prothrombin Time 13.8 11.4 - 15.2 seconds    INR 1.1 0.8 - 1.2  APTT     Status: None    Collection Time: 02/20/20 12:43 PM  Result Value Ref Range    aPTT 33 24 - 36 seconds  CBC     Status: None    Collection Time: 02/20/20 12:43 PM  Result Value Ref Range    WBC 6.4 4.0 - 10.5 K/uL    RBC 5.01 4.22 - 5.81 MIL/uL    Hemoglobin 15.9 13.0 - 17.0 g/dL    HCT 47.5 39 - 52 %    MCV 94.8 80.0 - 100.0 fL    MCH 31.7 26.0 - 34.0 pg    MCHC 33.5 30.0 - 36.0 g/dL    RDW 12.7 11.5 - 15.5 %    Platelets 247 150 - 400 K/uL    nRBC 0.0 0.0 - 0.2 %  Differential     Status: None    Collection Time: 02/20/20 12:43 PM  Result Value Ref Range    Neutrophils Relative % 65 %    Neutro Abs 4.2 1.7 - 7.7 K/uL    Lymphocytes Relative 19 %    Lymphs Abs 1.2 0.7 - 4.0 K/uL    Monocytes Relative 13 %    Monocytes Absolute 0.8 0.1 - 1.0 K/uL    Eosinophils Relative 2 %    Eosinophils Absolute 0.1 0.0 - 0.5 K/uL    Basophils Relative 1 %    Basophils Absolute 0.1 0.0 - 0.1 K/uL    Immature Granulocytes 0 %    Abs Immature Granulocytes 0.02 0.00 - 0.07 K/uL  Comprehensive metabolic panel     Status: Abnormal    Collection Time: 02/20/20 12:43 PM  Result  Value Ref Range    Sodium 137 135 - 145 mmol/L    Potassium 3.8 3.5 - 5.1 mmol/L    Chloride 103 98 - 111 mmol/L    CO2 26 22 - 32 mmol/L    Glucose, Bld 104 (H) 70 - 99 mg/dL    BUN 22 8 - 23 mg/dL    Creatinine, Ser 0.92 0.61 - 1.24 mg/dL    Calcium 8.9 8.9 - 10.3 mg/dL    Total Protein 7.4 6.5 - 8.1 g/dL    Albumin 4.5 3.5 - 5.0 g/dL    AST 21 15 - 41 U/L    ALT 15 0 - 44 U/L    Alkaline Phosphatase 68 38 - 126 U/L    Total Bilirubin 0.9 0.3 - 1.2 mg/dL    GFR, Estimated >60 >60 mL/min    Anion gap 8 5 - 15   I-stat chem 8, ED     Status: Abnormal    Collection Time: 02/20/20  1:05 PM  Result Value Ref Range    Sodium 141 135 - 145 mmol/L    Potassium 3.9 3.5 - 5.1 mmol/L    Chloride 103 98 - 111 mmol/L    BUN 23 8 - 23 mg/dL    Creatinine, Ser 1.00 0.61 - 1.24 mg/dL    Glucose, Bld 102 (H) 70 - 99 mg/dL    Calcium, Ion 1.18 1.15 - 1.40 mmol/L    TCO2 26 22 - 32 mmol/L    Hemoglobin 16.3 13.0 - 17.0 g/dL    HCT 48.0 39 - 52 %  Urine rapid drug screen (hosp performed)     Status: Abnormal    Collection Time: 02/20/20  8:20 PM  Result Value Ref Range    Opiates POSITIVE (A) NONE DETECTED    Cocaine NONE DETECTED NONE DETECTED    Benzodiazepines NONE DETECTED NONE DETECTED    Amphetamines NONE DETECTED NONE DETECTED    Tetrahydrocannabinol NONE DETECTED NONE DETECTED    Barbiturates NONE DETECTED NONE DETECTED  Urinalysis, Routine w reflex microscopic Urine, Clean Catch     Status: Abnormal    Collection Time: 02/20/20  8:20 PM  Result Value Ref Range    Color, Urine STRAW (A) YELLOW    APPearance CLEAR CLEAR    Specific Gravity, Urine 1.012 1.005 - 1.030    pH 7.0 5.0 - 8.0    Glucose, UA NEGATIVE NEGATIVE mg/dL    Hgb urine dipstick NEGATIVE NEGATIVE    Bilirubin Urine NEGATIVE NEGATIVE    Ketones, ur 5 (A) NEGATIVE mg/dL    Protein, ur NEGATIVE NEGATIVE mg/dL    Nitrite NEGATIVE NEGATIVE    Leukocytes,Ua NEGATIVE NEGATIVE  Resp Panel by RT-PCR (Flu A&B, Covid) Nasopharyngeal Swab     Status: None    Collection Time: 02/20/20  8:55 PM    Specimen: Nasopharyngeal Swab; Nasopharyngeal(NP) swabs in vial transport medium  Result Value Ref Range    SARS Coronavirus 2 by RT PCR NEGATIVE NEGATIVE    Influenza A by PCR NEGATIVE NEGATIVE    Influenza B by PCR NEGATIVE NEGATIVE  Hemoglobin A1c     Status: None    Collection Time: 02/21/20  2:53 AM  Result Value Ref Range    Hgb A1c MFr Bld 5.6 4.8 - 5.6 %    Mean Plasma Glucose 114.02 mg/dL  Lipid panel     Status: Abnormal     Collection Time: 02/21/20  2:53 AM  Result Value Ref Range  Cholesterol 204 (H) 0 - 200 mg/dL    Triglycerides 199 (H) <150 mg/dL    HDL 42 >40 mg/dL    Total CHOL/HDL Ratio 4.9 RATIO    VLDL 40 0 - 40 mg/dL    LDL Cholesterol 122 (H) 0 - 99 mg/dL       Imaging Results (Last 48 hours)  CT Code Stroke CTA Head W/WO contrast   Result Date: 02/21/2020 CLINICAL DATA:  Stroke workup EXAM: CT ANGIOGRAPHY HEAD AND NECK TECHNIQUE: Multidetector CT imaging of the head and neck was performed using the standard protocol during bolus administration of intravenous contrast. Multiplanar CT image reconstructions and MIPs were obtained to evaluate the vascular anatomy. Carotid stenosis measurements (when applicable) are obtained utilizing NASCET criteria, using the distal internal carotid diameter as the denominator. CONTRAST:  27mL OMNIPAQUE IOHEXOL 350 MG/ML SOLN COMPARISON:  CT and brain MRI from yesterday. FINDINGS: CTA NECK FINDINGS Aortic arch: Atheromatous plaque.  Three vessel branching. Right carotid system: Scattered mainly calcified atheromatous plaque primarily at the bifurcation. No flow limiting stenosis, ulceration, or beading. Left carotid system: Calcified plaque primarily at the bifurcation/bulb with at least 70% stenosis as measured on sagittal reformats. No ulceration or beading. Vertebral arteries: Proximal subclavian atherosclerosis without flow limiting stenosis. The left vertebral artery is dominant. Vertebral arteries are smooth and widely patent to the dura. Skeleton: Advanced, generalized disc and facet degeneration with C3-4 anterolisthesis. Partially covered left glenohumeral joint with intra-articular bodies in the medial recess. Other neck: No masslike or inflammatory finding seen. Upper chest: No acute finding Review of the MIP images confirms the above findings CTA HEAD FINDINGS Anterior circulation: Calcified plaque along the carotid siphons. No branch occlusion, beading, or  proximal flow limiting stenosis. Negative for aneurysm. Posterior circulation: Left dominant vertebral artery. Patent bilateral PICA. Patent bilateral superior cerebellar arteries with symmetric enhancement. A faint right AICA is present. Advanced right P2 segment stenosis. High-grade left P3 segment stenosis. No beading or aneurysm. Venous sinuses: Unremarkable in the arterial phase Anatomic variants: None significant Review of the MIP images confirms the above findings IMPRESSION: 1. No emergent finding.  No evident cerebellar branch occlusion. 2. At least 70% left ICA origin stenosis. 3. High-grade right P2 and left P3 atheromatous stenoses. Electronically Signed   By: Monte Fantasia M.D.   On: 02/21/2020 08:13    CT HEAD WO CONTRAST   Result Date: 02/20/2020 CLINICAL DATA:  Slurred speech, blurred vision. EXAM: CT HEAD WITHOUT CONTRAST TECHNIQUE: Contiguous axial images were obtained from the base of the skull through the vertex without intravenous contrast. COMPARISON:  May 24, 2019. FINDINGS: Brain: Mild diffuse cortical atrophy is noted. Mild chronic ischemic white matter disease is noted. No mass effect or midline shift is noted. Ventricular size is within normal limits. There is no evidence of mass lesion or hemorrhage. Low density is noted in the region of the right cerebellar peduncle concerning for infarction of indeterminate age. Vascular: No hyperdense vessel or unexpected calcification. Skull: Normal. Negative for fracture or focal lesion. Sinuses/Orbits: No acute finding. Other: None. IMPRESSION: Mild diffuse cortical atrophy. Mild chronic ischemic white matter disease. Low density is noted in the region of the right cerebellar peduncle concerning for infarction of indeterminate age. MRI is recommended for further evaluation. Electronically Signed   By: Marijo Conception M.D.   On: 02/20/2020 13:12    CT Code Stroke CTA Neck W/WO contrast   Result Date: 02/21/2020 CLINICAL DATA:  Stroke  workup EXAM: CT ANGIOGRAPHY HEAD  AND NECK TECHNIQUE: Multidetector CT imaging of the head and neck was performed using the standard protocol during bolus administration of intravenous contrast. Multiplanar CT image reconstructions and MIPs were obtained to evaluate the vascular anatomy. Carotid stenosis measurements (when applicable) are obtained utilizing NASCET criteria, using the distal internal carotid diameter as the denominator. CONTRAST:  73mL OMNIPAQUE IOHEXOL 350 MG/ML SOLN COMPARISON:  CT and brain MRI from yesterday. FINDINGS: CTA NECK FINDINGS Aortic arch: Atheromatous plaque.  Three vessel branching. Right carotid system: Scattered mainly calcified atheromatous plaque primarily at the bifurcation. No flow limiting stenosis, ulceration, or beading. Left carotid system: Calcified plaque primarily at the bifurcation/bulb with at least 70% stenosis as measured on sagittal reformats. No ulceration or beading. Vertebral arteries: Proximal subclavian atherosclerosis without flow limiting stenosis. The left vertebral artery is dominant. Vertebral arteries are smooth and widely patent to the dura. Skeleton: Advanced, generalized disc and facet degeneration with C3-4 anterolisthesis. Partially covered left glenohumeral joint with intra-articular bodies in the medial recess. Other neck: No masslike or inflammatory finding seen. Upper chest: No acute finding Review of the MIP images confirms the above findings CTA HEAD FINDINGS Anterior circulation: Calcified plaque along the carotid siphons. No branch occlusion, beading, or proximal flow limiting stenosis. Negative for aneurysm. Posterior circulation: Left dominant vertebral artery. Patent bilateral PICA. Patent bilateral superior cerebellar arteries with symmetric enhancement. A faint right AICA is present. Advanced right P2 segment stenosis. High-grade left P3 segment stenosis. No beading or aneurysm. Venous sinuses: Unremarkable in the arterial phase Anatomic  variants: None significant Review of the MIP images confirms the above findings IMPRESSION: 1. No emergent finding.  No evident cerebellar branch occlusion. 2. At least 70% left ICA origin stenosis. 3. High-grade right P2 and left P3 atheromatous stenoses. Electronically Signed   By: Monte Fantasia M.D.   On: 02/21/2020 08:13    MR BRAIN WO CONTRAST   Result Date: 02/20/2020 CLINICAL DATA:  Right-sided weakness, slurred speech, blurry vision EXAM: MRI HEAD WITHOUT CONTRAST TECHNIQUE: Multiplanar, multiecho pulse sequences of the brain and surrounding structures were obtained without intravenous contrast. COMPARISON:  05/24/2019 FINDINGS: Brain: There is area of mildly reduced diffusion within the right brachium pontis measuring approximately 1.4 cm. There is corresponding T2 hyperintensity with rounded appearance. Additional patchy and confluent areas of T2 hyperintensity in the supratentorial white matter are nonspecific but probably reflect moderate to advanced chronic microvascular ischemic changes. Small chronic right cerebellar infarct. There is no intracranial hemorrhage. There is no intracranial mass or significant mass effect. There is no hydrocephalus or extra-axial fluid collection. Prominence of the ventricles and sulci reflects generalized parenchymal volume loss similar to the prior study. Vascular: Major vessel flow voids at the skull base are preserved. Skull and upper cervical spine: Normal marrow signal is preserved. Sinuses/Orbits: Minor mucosal thickening. Bilateral lens replacements. Other: Sella is unremarkable.  Mastoid air cells are clear. IMPRESSION: Area of abnormal signal within the right middle cerebellar peduncle may reflect acute to subacute infarct. Given rounded appearance, follow-up is recommended ensure appropriate evolution (suggest addition of post contrast imaging at that time). Moderate to advanced chronic microvascular ischemic changes. Small chronic right cerebellar  infarct. Electronically Signed   By: Macy Mis M.D.   On: 02/20/2020 15:44    MR BRAIN W CONTRAST   Result Date: 02/20/2020 CLINICAL DATA:  Right middle cerebellar peduncle lesion. EXAM: MRI HEAD WITH CONTRAST TECHNIQUE: Multiplanar, multiecho pulse sequences of the brain and surrounding structures were obtained with intravenous contrast. CONTRAST:  72mL  GADAVIST GADOBUTROL 1 MMOL/ML IV SOLN COMPARISON:  Brain MRI without contrast 02/20/2020 FINDINGS: There is no abnormal contrast enhancement at the site of the right middle cerebellar peduncle lesion. There is a punctate focus of contrast enhancement within the posterior right cerebellar hemisphere (series 5, image 43) that is favored to be vascular. IMPRESSION: 1. No abnormal contrast enhancement at the site of the right middle cerebellar peduncle lesion. 2. Punctate focus of enhancement in the posterior right cerebellar hemispheres favored to be vascular, but attention on follow-up scan in 6-12 weeks recommended. Electronically Signed   By: Ulyses Jarred M.D.   On: 02/20/2020 19:19    DG CHEST PORT 1 VIEW   Result Date: 02/20/2020 CLINICAL DATA:  CVA EXAM: PORTABLE CHEST 1 VIEW COMPARISON:  10/31/2003 FINDINGS: Single frontal view of the chest demonstrates mild enlargement the cardiac silhouette, accentuated by portable AP technique. No airspace disease, effusion, or pneumothorax. Significant bilateral shoulder osteoarthritis. No acute fracture. IMPRESSION: 1. Enlarged cardiac silhouette. 2. No acute airspace disease. Electronically Signed   By: Randa Ngo M.D.   On: 02/20/2020 23:50       Assessment/Plan: Diagnosis: Right middle cerebellar peduncle infarct Stroke: Continue secondary stroke prophylaxis and Risk Factor Modification listed below:   Antiplatelet therapy:   Blood Pressure Management:  Continue current medication with prn's with permisive HTN per primary team Statin Agent:   Tobacco abuse:   Right sided hemiparesis: PT/OT  for mobility, ADL training  Motor recovery: Fluoxetine Labs independently reviewed.  Records reviewed and summated above.   1. Does the need for close, 24 hr/day medical supervision in concert with the patient's rehab needs make it unreasonable for this patient to be served in a less intensive setting? Yes  2. Co-Morbidities requiring supervision/potential complications: HTN (monitor and provide prns in accordance with increased physical exertion and pain), hyperlipidemia, tobacco abuse, depression (ensure mood does not hinder progress of therapies),?  Irregular rhythm (repeat ECG) 3. Due to safety, disease management, pain management and patient education, does the patient require 24 hr/day rehab nursing? Yes 4. Does the patient require coordinated care of a physician, rehab nurse, therapy disciplines of PT/OT to address physical and functional deficits in the context of the above medical diagnosis(es)? Yes Addressing deficits in the following areas: balance, endurance, locomotion, strength, transferring, bathing, dressing, toileting and psychosocial support 5. Can the patient actively participate in an intensive therapy program of at least 3 hrs of therapy per day at least 5 days per week? Yes 6. The potential for patient to make measurable gains while on inpatient rehab is excellent 7. Anticipated functional outcomes upon discharge from inpatient rehab are modified independent and supervision  with PT, modified independent and supervision with OT, n/a with SLP. 8. Estimated rehab length of stay to reach the above functional goals is: 7-11 days. 9. Anticipated discharge destination: Home 10. Overall Rehab/Functional Prognosis: good   RECOMMENDATIONS: This patient's condition is appropriate for continued rehabilitative care in the following setting: CIR Patient has agreed to participate in recommended program. Yes Note that insurance prior authorization may be required for reimbursement for  recommended care.   Comment: Rehab Admissions Coordinator to follow up.   I have personally performed a face to face diagnostic evaluation, including, but not limited to relevant history and physical exam findings, of this patient and developed relevant assessment and plan.  Additionally, I have reviewed and concur with the physician assistant's documentation above.    Delice Lesch, MD, ABPMR Lavon Paganini Angiulli, PA-C 02/21/2020  Revision History                     Routing History           Note Details  Author Posey Pronto, Domenick Bookbinder, MD File Time 02/21/2020  3:51 PM  Author Type Physician Status Signed  Last Editor Ruben Arn, MD Service [facility] [place] # [id number] Admit Date 02/22/2020

## 2020-02-22 NOTE — Progress Notes (Signed)
Patient admitted to 4w15 A&Ox4 VSS no complaints of pain. Oriented to unit, room fall and visitor policies

## 2020-02-22 NOTE — Progress Notes (Signed)
Inpatient Rehabilitation Admissions Coordinator  I have insurance approval and bed to admit patient to today. I met at bedside with patient and his wife and they are in agreement. I have medical clearance from Dr. Karleen Hampshire and have contacted acute team as well as TOC. I will make the arrangements to admit today.  Ruben Baxter, RN, MSN Rehab Admissions Coordinator [phone] 02/22/2020 10:52 AM

## 2020-02-22 NOTE — Discharge Summary (Signed)
Physician Discharge Summary  VERGIL BURBY WSF:[id number] DOB: 11-12-1932 DOA: 02/20/2020  PCP: Lajean Manes, MD  Admit date: 02/20/2020 Discharge date: 02/22/2020  Admitted From: Home. Disposition: CIR.  Recommendations for Outpatient Follow-up:  Follow up with PCP in 1-2 weeks Please obtain BMP/CBC in one week Please follow up with cardiology as outpatient.  Please follow up with vascular surgery for  Left ICA stenosis follow up . Please follow up with cardiology for outpatient cardiac monitoring to rule out atrial fibrillation.  Neurology recommends DAPT for 3 weeks followed by aspirin alone.  Neurology recommends starting low dose pravastatin, watch for muscle aches and rhabdomyolysis.   Discharge Condition:Stable. CODE STATUS: FULL CODE.  Diet recommendation: Heart Healthy    Brief/Interim Summary: 86 year-old gentleman prior history of hypertension hyperlipidemia, GERD, radiculopathy presents with vision abnormalities and gait abnormalities and unsteady gait.  He was admitted for evaluation of stroke.  Neurology was consulted.  Discharge Diagnoses:  Active Problems:   Essential hypertension   CVA (cerebral vascular accident) ([place])   Erythrocytosis   BPH (benign prostatic hyperplasia)   Mixed hyperlipidemia   Tobacco abuse   Depression  Right cerebellar peduncle stroke  CT angiogram of the head and neck showed At least 70% left ICA origin stenosis.  High-grade right P2 and left P3 atheromatous stenoses. Please follow up with vascular surgery for  Left ICA stenosis follow up . Please follow up with cardiology for outpatient cardiac monitoring to rule out atrial fibrillation.  Neurology recommends DAPT for 3 weeks followed by aspirin alone.  Patient is already on Zetia and low dose pravastatin added.  Therapy evaluations recommending CIR. Neurology consulted and is on board and following the patient. Echocardiogram showed LVEF of 30 to 35%, will need cardiology consult  for ischemic work up, possibly outpatient.     Essential hypertension Optimal BP parameters.     History of BPH Continue with Flomax   Agitation, restless As needed Ativan ordered.   Peripheral neuropathy Continue with gabapentin 600 mg 3 times daily.   GERD Stable Continue with Protonix 40 mg daily.   Depression Continue with Celexa 10 mg daily  Discharge Instructions  Discharge Instructions    Diet - low sodium heart healthy   Complete by: As directed      Allergies as of 02/22/2020      Reactions   Simvastatin Other (See Comments)   Leg pains   Other Other (See Comments)   UNSPECIFIED REACTION  Reaction to anesthesia per patient      Medication List    STOP taking these medications   amLODipine 2.5 MG tablet Commonly known as: NORVASC   celecoxib 200 MG capsule Commonly known as: CELEBREX     TAKE these medications   aspirin 81 MG EC tablet Take 1 tablet (81 mg total) by mouth daily. Swallow whole.   baclofen 10 MG tablet Commonly known as: LIORESAL Take 10 mg by mouth 3 (three) times daily as needed for muscle spasms.   citalopram 10 MG tablet Commonly known as: CELEXA Take 10 mg by mouth daily.   clopidogrel 75 MG tablet Commonly known as: Plavix Take 1 tablet (75 mg total) by mouth daily for 21 days.   ezetimibe 10 MG tablet Commonly known as: ZETIA Take 10 mg by mouth every evening.   fluticasone 50 MCG/ACT nasal spray Commonly known as: FLONASE Place 1 spray into both nostrils daily as needed for allergies.   gabapentin 600 MG tablet Commonly known as: NEURONTIN Take  600 mg by mouth 3 (three) times daily. What changed: Another medication with the same name was removed. Continue taking this medication, and follow the directions you see here.   HYDROcodone-acetaminophen 7.5-325 MG tablet Commonly known as: NORCO Take 1 tablet by mouth every 8 (eight) hours as needed for moderate pain. What changed: Another  medication with the same name was removed. Continue taking this medication, and follow the directions you see here.   LORazepam 1 MG tablet Commonly known as: ATIVAN Take 1 mg by mouth daily.   MULTIPLE VITAMIN PO Take 1 tablet by mouth daily.   omeprazole 20 MG capsule Commonly known as: PRILOSEC Take 20 mg by mouth daily.   pravastatin 20 MG tablet Commonly known as: PRAVACHOL Take 1 tablet (20 mg total) by mouth daily at 6 PM.   tamsulosin 0.4 MG Caps capsule Commonly known as: FLOMAX Take 0.4 mg by mouth daily.       Follow-up Information    Pieter Partridge, DO. Schedule an appointment as soon as possible for a visit in 1 week(s).   Specialty: Neurology Contact information: [address] [place] [id number] [phone]              Allergies  Allergen Reactions  . Simvastatin Other (See Comments)    Leg pains   . Other Other (See Comments)    UNSPECIFIED REACTION  Reaction to anesthesia per patient    Consultations:  Neurology  Cardiology.    Procedures/Studies: CT Code Stroke CTA Head W/WO contrast  Result Date: 02/21/2020 CLINICAL DATA:  Stroke workup EXAM: CT ANGIOGRAPHY HEAD AND NECK TECHNIQUE: Multidetector CT imaging of the head and neck was performed using the standard protocol during bolus administration of intravenous contrast. Multiplanar CT image reconstructions and MIPs were obtained to evaluate the vascular anatomy. Carotid stenosis measurements (when applicable) are obtained utilizing NASCET criteria, using the distal internal carotid diameter as the denominator. CONTRAST:  58mL OMNIPAQUE IOHEXOL 350 MG/ML SOLN COMPARISON:  CT and brain MRI from yesterday. FINDINGS: CTA NECK FINDINGS Aortic arch: Atheromatous plaque.  Three vessel branching. Right carotid system: Scattered mainly calcified atheromatous plaque primarily at the bifurcation. No flow limiting stenosis, ulceration, or beading. Left carotid system: Calcified  plaque primarily at the bifurcation/bulb with at least 70% stenosis as measured on sagittal reformats. No ulceration or beading. Vertebral arteries: Proximal subclavian atherosclerosis without flow limiting stenosis. The left vertebral artery is dominant. Vertebral arteries are smooth and widely patent to the dura. Skeleton: Advanced, generalized disc and facet degeneration with C3-4 anterolisthesis. Partially covered left glenohumeral joint with intra-articular bodies in the medial recess. Other neck: No masslike or inflammatory finding seen. Upper chest: No acute finding Review of the MIP images confirms the above findings CTA HEAD FINDINGS Anterior circulation: Calcified plaque along the carotid siphons. No branch occlusion, beading, or proximal flow limiting stenosis. Negative for aneurysm. Posterior circulation: Left dominant vertebral artery. Patent bilateral PICA. Patent bilateral superior cerebellar arteries with symmetric enhancement. A faint right AICA is present. Advanced right P2 segment stenosis. High-grade left P3 segment stenosis. No beading or aneurysm. Venous sinuses: Unremarkable in the arterial phase Anatomic variants: None significant Review of the MIP images confirms the above findings IMPRESSION: 1. No emergent finding.  No evident cerebellar branch occlusion. 2. At least 70% left ICA origin stenosis. 3. High-grade right P2 and left P3 atheromatous stenoses. Electronically Signed   By: Monte Fantasia M.D.   On: 02/21/2020 08:13   CT  HEAD WO CONTRAST  Result Date: 02/20/2020 CLINICAL DATA:  Slurred speech, blurred vision. EXAM: CT HEAD WITHOUT CONTRAST TECHNIQUE: Contiguous axial images were obtained from the base of the skull through the vertex without intravenous contrast. COMPARISON:  May 24, 2019. FINDINGS: Brain: Mild diffuse cortical atrophy is noted. Mild chronic ischemic white matter disease is noted. No mass effect or midline shift is noted. Ventricular size is within normal  limits. There is no evidence of mass lesion or hemorrhage. Low density is noted in the region of the right cerebellar peduncle concerning for infarction of indeterminate age. Vascular: No hyperdense vessel or unexpected calcification. Skull: Normal. Negative for fracture or focal lesion. Sinuses/Orbits: No acute finding. Other: None. IMPRESSION: Mild diffuse cortical atrophy. Mild chronic ischemic white matter disease. Low density is noted in the region of the right cerebellar peduncle concerning for infarction of indeterminate age. MRI is recommended for further evaluation. Electronically Signed   By: Marijo Conception M.D.   On: 02/20/2020 13:12   CT Code Stroke CTA Neck W/WO contrast  Result Date: 02/21/2020 CLINICAL DATA:  Stroke workup EXAM: CT ANGIOGRAPHY HEAD AND NECK TECHNIQUE: Multidetector CT imaging of the head and neck was performed using the standard protocol during bolus administration of intravenous contrast. Multiplanar CT image reconstructions and MIPs were obtained to evaluate the vascular anatomy. Carotid stenosis measurements (when applicable) are obtained utilizing NASCET criteria, using the distal internal carotid diameter as the denominator. CONTRAST:  42mL OMNIPAQUE IOHEXOL 350 MG/ML SOLN COMPARISON:  CT and brain MRI from yesterday. FINDINGS: CTA NECK FINDINGS Aortic arch: Atheromatous plaque.  Three vessel branching. Right carotid system: Scattered mainly calcified atheromatous plaque primarily at the bifurcation. No flow limiting stenosis, ulceration, or beading. Left carotid system: Calcified plaque primarily at the bifurcation/bulb with at least 70% stenosis as measured on sagittal reformats. No ulceration or beading. Vertebral arteries: Proximal subclavian atherosclerosis without flow limiting stenosis. The left vertebral artery is dominant. Vertebral arteries are smooth and widely patent to the dura. Skeleton: Advanced, generalized disc and facet degeneration with C3-4  anterolisthesis. Partially covered left glenohumeral joint with intra-articular bodies in the medial recess. Other neck: No masslike or inflammatory finding seen. Upper chest: No acute finding Review of the MIP images confirms the above findings CTA HEAD FINDINGS Anterior circulation: Calcified plaque along the carotid siphons. No branch occlusion, beading, or proximal flow limiting stenosis. Negative for aneurysm. Posterior circulation: Left dominant vertebral artery. Patent bilateral PICA. Patent bilateral superior cerebellar arteries with symmetric enhancement. A faint right AICA is present. Advanced right P2 segment stenosis. High-grade left P3 segment stenosis. No beading or aneurysm. Venous sinuses: Unremarkable in the arterial phase Anatomic variants: None significant Review of the MIP images confirms the above findings IMPRESSION: 1. No emergent finding.  No evident cerebellar branch occlusion. 2. At least 70% left ICA origin stenosis. 3. High-grade right P2 and left P3 atheromatous stenoses. Electronically Signed   By: Monte Fantasia M.D.   On: 02/21/2020 08:13   MR BRAIN WO CONTRAST  Result Date: 02/20/2020 CLINICAL DATA:  Right-sided weakness, slurred speech, blurry vision EXAM: MRI HEAD WITHOUT CONTRAST TECHNIQUE: Multiplanar, multiecho pulse sequences of the brain and surrounding structures were obtained without intravenous contrast. COMPARISON:  05/24/2019 FINDINGS: Brain: There is area of mildly reduced diffusion within the right brachium pontis measuring approximately 1.4 cm. There is corresponding T2 hyperintensity with rounded appearance. Additional patchy and confluent areas of T2 hyperintensity in the supratentorial white matter are nonspecific but probably reflect moderate to advanced  chronic microvascular ischemic changes. Small chronic right cerebellar infarct. There is no intracranial hemorrhage. There is no intracranial mass or significant mass effect. There is no hydrocephalus or  extra-axial fluid collection. Prominence of the ventricles and sulci reflects generalized parenchymal volume loss similar to the prior study. Vascular: Major vessel flow voids at the skull base are preserved. Skull and upper cervical spine: Normal marrow signal is preserved. Sinuses/Orbits: Minor mucosal thickening. Bilateral lens replacements. Other: Sella is unremarkable.  Mastoid air cells are clear. IMPRESSION: Area of abnormal signal within the right middle cerebellar peduncle may reflect acute to subacute infarct. Given rounded appearance, follow-up is recommended ensure appropriate evolution (suggest addition of post contrast imaging at that time). Moderate to advanced chronic microvascular ischemic changes. Small chronic right cerebellar infarct. Electronically Signed   By: Macy Mis M.D.   On: 02/20/2020 15:44   MR BRAIN W CONTRAST  Result Date: 02/20/2020 CLINICAL DATA:  Right middle cerebellar peduncle lesion. EXAM: MRI HEAD WITH CONTRAST TECHNIQUE: Multiplanar, multiecho pulse sequences of the brain and surrounding structures were obtained with intravenous contrast. CONTRAST:  1mL GADAVIST GADOBUTROL 1 MMOL/ML IV SOLN COMPARISON:  Brain MRI without contrast 02/20/2020 FINDINGS: There is no abnormal contrast enhancement at the site of the right middle cerebellar peduncle lesion. There is a punctate focus of contrast enhancement within the posterior right cerebellar hemisphere (series 5, image 43) that is favored to be vascular. IMPRESSION: 1. No abnormal contrast enhancement at the site of the right middle cerebellar peduncle lesion. 2. Punctate focus of enhancement in the posterior right cerebellar hemispheres favored to be vascular, but attention on follow-up scan in 6-12 weeks recommended. Electronically Signed   By: Ulyses Jarred M.D.   On: 02/20/2020 19:19   DG CHEST PORT 1 VIEW  Result Date: 02/20/2020 CLINICAL DATA:  CVA EXAM: PORTABLE CHEST 1 VIEW COMPARISON:  10/31/2003 FINDINGS:  Single frontal view of the chest demonstrates mild enlargement the cardiac silhouette, accentuated by portable AP technique. No airspace disease, effusion, or pneumothorax. Significant bilateral shoulder osteoarthritis. No acute fracture. IMPRESSION: 1. Enlarged cardiac silhouette. 2. No acute airspace disease. Electronically Signed   By: Randa Ngo M.D.   On: 02/20/2020 23:50   ECHOCARDIOGRAM COMPLETE  Result Date: 02/21/2020    ECHOCARDIOGRAM REPORT   Patient Name:   Ruben Barrera Date of Exam: 02/21/2020 Medical Rec #:  [id number]      Height:       67.0 in Accession #:    [id number]     Weight:       191.2 lb Date of Birth:  1932-07-08       BSA:          1.984 m Patient Age:    21 years       BP:           154/104 mmHg Patient Gender: M              HR:           97 bpm. Exam Location:  Inpatient Procedure: 2D Echo, Cardiac Doppler and Color Doppler Indications:    Stroke  History:        Patient has no prior history of Echocardiogram examinations.                 Stroke, Signs/Symptoms:Dyspnea; Risk Factors:Hypertension.  Sonographer:    Roseanna Rainbow RDCS Referring Phys: [place]  Sonographer Comments: Technically difficult study due to poor echo windows. Patient did not have  IV access, and refused to get one. Definity not used. IMPRESSIONS  1. Left ventricular ejection fraction, by estimation, is 30 to 35%. The left ventricle has moderately decreased function. The left ventricle demonstrates global hypokinesis. The left ventricular internal cavity size was mildly dilated. There is severe concentric left ventricular hypertrophy. Left ventricular diastolic parameters are indeterminate.  2. Right ventricular systolic function is normal. The right ventricular size is normal.  3. Left atrial size was severely dilated.  4. The mitral valve is grossly normal. Mild mitral valve regurgitation.  5. The aortic valve is calcified. Aortic valve regurgitation is not visualized. Mild to moderate aortic  valve stenosis. Aortic valve mean gradient measures 25.0 mmHg. Comparison(s): No prior Echocardiogram. Conclusion(s)/Recommendation(s): LV function is reduced. There is at least mild to moderate aortic stenosis; this may be underestimated in the setting of LV function. FINDINGS  Left Ventricle: LVMI 197 g/m2 RWT 0.54. Left ventricular ejection fraction, by estimation, is 30 to 35%. The left ventricle has moderately decreased function. The left ventricle demonstrates global hypokinesis. The left ventricular internal cavity size was mildly dilated. There is severe concentric left ventricular hypertrophy. Left ventricular diastolic parameters are indeterminate. Right Ventricle: The right ventricular size is normal. No increase in right ventricular wall thickness. Right ventricular systolic function is normal. Left Atrium: Left atrial size was severely dilated. Right Atrium: Right atrial size was normal in size. Pericardium: There is no evidence of pericardial effusion. Mitral Valve: The mitral valve is grossly normal. There is mild thickening of the mitral valve leaflet(s). Mild to moderate mitral annular calcification. Mild mitral valve regurgitation. Tricuspid Valve: The tricuspid valve is grossly normal. Tricuspid valve regurgitation is not demonstrated. Aortic Valve: LV SVI 38. The aortic valve is calcified. Aortic valve regurgitation is not visualized. Mild to moderate aortic stenosis is present. Aortic valve mean gradient measures 25.0 mmHg. Aortic valve peak gradient measures 39.1 mmHg. Aortic valve area, by VTI measures 1.32 cm. Pulmonic Valve: The pulmonic valve was grossly normal. Pulmonic valve regurgitation is not visualized. Aorta: The aortic root is normal in size and structure. Venous: The pulmonary veins were not well visualized. The inferior vena cava was not well visualized. IAS/Shunts: The atrial septum is grossly normal.  LEFT VENTRICLE PLAX 2D LVIDd:         5.90 cm      Diastology LVIDs:          5.30 cm      LV e' medial:    3.99 cm/s LV PW:         1.60 cm      LV E/e' medial:  20.7 LV IVS:        1.30 cm      LV e' lateral:   4.12 cm/s LVOT diam:     2.45 cm      LV E/e' lateral: 20.0 LV SV:         80 LV SV Index:   40 LVOT Area:     4.71 cm  LV Volumes (MOD) LV vol d, MOD A2C: 165.0 ml LV vol d, MOD A4C: 119.5 ml LV vol s, MOD A2C: 111.0 ml LV vol s, MOD A4C: 76.0 ml LV SV MOD A2C:     54.0 ml LV SV MOD A4C:     119.5 ml LV SV MOD BP:      48.8 ml RIGHT VENTRICLE             IVC RV S prime:     18.00  cm/s  IVC diam: 3.10 cm TAPSE (M-mode): 2.3 cm LEFT ATRIUM             Index       RIGHT ATRIUM           Index LA diam:        4.20 cm 2.12 cm/m  RA Area:     12.20 cm LA Vol (A2C):   70.5 ml 35.54 ml/m RA Volume:   24.90 ml  12.55 ml/m LA Vol (A4C):   94.0 ml 47.38 ml/m LA Biplane Vol: 85.4 ml 43.05 ml/m  AORTIC VALVE AV Area (Vmax):    1.22 cm AV Area (Vmean):   1.08 cm AV Area (VTI):     1.32 cm AV Vmax:           312.75 cm/s AV Vmean:          236.500 cm/s AV VTI:            0.608 m AV Peak Grad:      39.1 mmHg AV Mean Grad:      25.0 mmHg LVOT Vmax:         80.90 cm/s LVOT Vmean:        54.400 cm/s LVOT VTI:          0.170 m LVOT/AV VTI ratio: 0.28  AORTA Ao Root diam: 3.20 cm Ao Asc diam:  3.10 cm MITRAL VALVE MV Area (PHT): 4.97 cm    SHUNTS MV Decel Time: 153 msec    Systemic VTI:  0.17 m MV E velocity: 82.53 cm/s  Systemic Diam: 2.45 cm Rudean Haskell MD Electronically signed by Rudean Haskell MD Signature Date/Time: 02/21/2020/4:33:50 PM    Final     Echo   Subjective: No new complaints.   Discharge Exam: Vitals:   02/22/20 0411 02/22/20 0806  BP: (!) 152/86 (!) 138/93  Pulse: 88 (!) 51  Resp: 17 18  Temp: (!) 97.3 F (36.3 C) 97.8 F (36.6 C)  SpO2: 91% 95%   Vitals:   02/21/20 2341 02/22/20 0004 02/22/20 0411 02/22/20 0806  BP: (!) 116/101 130/77 (!) 152/86 (!) 138/93  Pulse: 94 72 88 (!) 51  Resp: 18 17 17 18   Temp: 98.4 F (36.9 C) (!) 97.4 F  (36.3 C) (!) 97.3 F (36.3 C) 97.8 F (36.6 C)  TempSrc: Oral Oral Oral Oral  SpO2: 93% 97% 91% 95%  Weight:        General: Pt is alert, awake, not in acute distress Cardiovascular: RRR, S1/S2 +, no rubs, no gallops Respiratory: CTA bilaterally, no wheezing, no rhonchi Abdominal: Soft, NT, ND, bowel sounds + Extremities: no edema, no cyanosis    The results of significant diagnostics from this hospitalization (including imaging, microbiology, ancillary and laboratory) are listed below for reference.     Microbiology: Recent Results (from the past 240 hour(s))  Resp Panel by RT-PCR (Flu A&B, Covid) Nasopharyngeal Swab     Status: None   Collection Time: 02/20/20  8:55 PM   Specimen: Nasopharyngeal Swab; Nasopharyngeal(NP) swabs in vial transport medium  Result Value Ref Range Status   SARS Coronavirus 2 by RT PCR NEGATIVE NEGATIVE Final    Comment: (NOTE) SARS-CoV-2 target nucleic acids are NOT DETECTED.  The SARS-CoV-2 RNA is generally detectable in upper respiratory specimens during the acute phase of infection. The lowest concentration of SARS-CoV-2 viral copies this assay can detect is 138 copies/mL. A negative result does not preclude SARS-Cov-2 infection and should not be used  as the sole basis for treatment or other patient management decisions. A negative result may occur with  improper specimen collection/handling, submission of specimen other than nasopharyngeal swab, presence of viral mutation(s) within the areas targeted by this assay, and inadequate number of viral copies(<138 copies/mL). A negative result must be combined with clinical observations, patient history, and epidemiological information. The expected result is Negative.  Fact Sheet for Patients:  EntrepreneurPulse.com.au  Fact Sheet for Healthcare Providers:  IncredibleEmployment.be  This test is no t yet approved or cleared by the [place] FDA and  has  been authorized for detection and/or diagnosis of SARS-CoV-2 by FDA under an Emergency Use Authorization (EUA). This EUA will remain  in effect (meaning this test can be used) for the duration of the COVID-19 declaration under Section 564(b)(1) of the Act, 21 U.S.C.section 360bbb-3(b)(1), unless the authorization is terminated  or revoked sooner.       Influenza A by PCR NEGATIVE NEGATIVE Final   Influenza B by PCR NEGATIVE NEGATIVE Final    Comment: (NOTE) The Xpert Xpress SARS-CoV-2/FLU/RSV plus assay is intended as an aid in the diagnosis of influenza from Nasopharyngeal swab specimens and should not be used as a sole basis for treatment. Nasal washings and aspirates are unacceptable for Xpert Xpress SARS-CoV-2/FLU/RSV testing.  Fact Sheet for Patients: EntrepreneurPulse.com.au  Fact Sheet for Healthcare Providers: IncredibleEmployment.be  This test is not yet approved or cleared by the [place] FDA and has been authorized for detection and/or diagnosis of SARS-CoV-2 by FDA under an Emergency Use Authorization (EUA). This EUA will remain in effect (meaning this test can be used) for the duration of the COVID-19 declaration under Section 564(b)(1) of the Act, 21 U.S.C. section 360bbb-3(b)(1), unless the authorization is terminated or revoked.  Performed at [facility], [place] [address], [place], [place] [address]      Labs: BNP (last 3 results) No results for input(s): BNP in the last 8760 hours. Basic Metabolic Panel: Recent Labs  Lab 02/20/20 1243 02/20/20 1305  NA 137 141  K 3.8 3.9  CL 103 103  CO2 26  --   GLUCOSE 104* 102*  BUN 22 23  CREATININE 0.92 1.00  CALCIUM 8.9  --    Liver Function Tests: Recent Labs  Lab 02/20/20 1243  AST 21  ALT 15  ALKPHOS 68  BILITOT 0.9  PROT 7.4  ALBUMIN 4.5   No results for input(s): LIPASE, AMYLASE in the last 168 hours. No results for input(s): AMMONIA  in the last 168 hours. CBC: Recent Labs  Lab 02/20/20 1243 02/20/20 1305  WBC 6.4  --   NEUTROABS 4.2  --   HGB 15.9 16.3  HCT 47.5 48.0  MCV 94.8  --   PLT 247  --    Cardiac Enzymes: No results for input(s): CKTOTAL, CKMB, CKMBINDEX, TROPONINI in the last 168 hours. BNP: Invalid input(s): POCBNP CBG: No results for input(s): GLUCAP in the last 168 hours. D-Dimer No results for input(s): DDIMER in the last 72 hours. Hgb A1c Recent Labs    02/21/20 0253  HGBA1C 5.6   Lipid Profile Recent Labs    02/21/20 0253  CHOL 204*  HDL 42  LDLCALC 122*  TRIG 199*  CHOLHDL 4.9   Thyroid function studies No results for input(s): TSH, T4TOTAL, T3FREE, THYROIDAB in the last 72 hours.  Invalid input(s): FREET3 Anemia work up No results for input(s): VITAMINB12, FOLATE, FERRITIN, TIBC, IRON, RETICCTPCT in the last 72 hours. Urinalysis  Component Value Date/Time   COLORURINE STRAW (A) 02/20/2020 2020   APPEARANCEUR CLEAR 02/20/2020 2020   LABSPEC 1.012 02/20/2020 2020   PHURINE 7.0 02/20/2020 2020   GLUCOSEU NEGATIVE 02/20/2020 2020   HGBUR NEGATIVE 02/20/2020 2020   BILIRUBINUR NEGATIVE 02/20/2020 2020   KETONESUR 5 (A) 02/20/2020 2020   PROTEINUR NEGATIVE 02/20/2020 2020   NITRITE NEGATIVE 02/20/2020 2020   LEUKOCYTESUR NEGATIVE 02/20/2020 2020   Sepsis Labs Invalid input(s): PROCALCITONIN,  WBC,  LACTICIDVEN Microbiology Recent Results (from the past 240 hour(s))  Resp Panel by RT-PCR (Flu A&B, Covid) Nasopharyngeal Swab     Status: None   Collection Time: 02/20/20  8:55 PM   Specimen: Nasopharyngeal Swab; Nasopharyngeal(NP) swabs in vial transport medium  Result Value Ref Range Status   SARS Coronavirus 2 by RT PCR NEGATIVE NEGATIVE Final    Comment: (NOTE) SARS-CoV-2 target nucleic acids are NOT DETECTED.  The SARS-CoV-2 RNA is generally detectable in upper respiratory specimens during the acute phase of infection. The lowest concentration of SARS-CoV-2  viral copies this assay can detect is 138 copies/mL. A negative result does not preclude SARS-Cov-2 infection and should not be used as the sole basis for treatment or other patient management decisions. A negative result may occur with  improper specimen collection/handling, submission of specimen other than nasopharyngeal swab, presence of viral mutation(s) within the areas targeted by this assay, and inadequate number of viral copies(<138 copies/mL). A negative result must be combined with clinical observations, patient history, and epidemiological information. The expected result is Negative.  Fact Sheet for Patients:  EntrepreneurPulse.com.au  Fact Sheet for Healthcare Providers:  IncredibleEmployment.be  This test is no t yet approved or cleared by the [place] FDA and  has been authorized for detection and/or diagnosis of SARS-CoV-2 by FDA under an Emergency Use Authorization (EUA). This EUA will remain  in effect (meaning this test can be used) for the duration of the COVID-19 declaration under Section 564(b)(1) of the Act, 21 U.S.C.section 360bbb-3(b)(1), unless the authorization is terminated  or revoked sooner.       Influenza A by PCR NEGATIVE NEGATIVE Final   Influenza B by PCR NEGATIVE NEGATIVE Final    Comment: (NOTE) The Xpert Xpress SARS-CoV-2/FLU/RSV plus assay is intended as an aid in the diagnosis of influenza from Nasopharyngeal swab specimens and should not be used as a sole basis for treatment. Nasal washings and aspirates are unacceptable for Xpert Xpress SARS-CoV-2/FLU/RSV testing.  Fact Sheet for Patients: EntrepreneurPulse.com.au  Fact Sheet for Healthcare Providers: IncredibleEmployment.be  This test is not yet approved or cleared by the [place] FDA and has been authorized for detection and/or diagnosis of SARS-CoV-2 by FDA under an Emergency Use Authorization  (EUA). This EUA will remain in effect (meaning this test can be used) for the duration of the COVID-19 declaration under Section 564(b)(1) of the Act, 21 U.S.C. section 360bbb-3(b)(1), unless the authorization is terminated or revoked.  Performed at [facility], [place] [address] [place], [place] [address]      Time coordinating discharge: Over 30 minutes  SIGNED:   Hosie Poisson, MD  [facility] 02/22/2020, 10:57 AM Pager   If 7PM-7AM, please contact night-coverage [email or web address] Password [email or web address]

## 2020-02-22 NOTE — TOC Transition Note (Signed)
Transition of Care [facility]) - CM/SW Discharge Note   Patient Details  Name: Ruben Barrera MRN: [id number] Date of Birth: 09-Sep-1932  Transition of Care [facility]) CM/SW Contact:  Pollie Friar, RN Phone Number: 02/22/2020, 1:19 PM   Clinical Narrative:    Pt is discharging to CIR today. CM is signing off.    Final next level of care: IP Rehab Facility Barriers to Discharge: No Barriers Identified   Patient Goals and CMS Choice        Discharge Placement                       Discharge Plan and Services                                     Social Determinants of Health (SDOH) Interventions     Readmission Risk Interventions No flowsheet data found.

## 2020-02-22 NOTE — PMR Pre-admission (Addendum)
PMR Admission Coordinator Pre-Admission Assessment  Patient: Ruben Barrera is an 86 y.o., male MRN: [id number] DOB: [birth year] Height:   Weight: 84 kg              Insurance Information HMO:     PPO: yes     PCP:      IPA:      80/20:      OTHER:  PRIMARY: [employer]      Policy#: [id number]      Subscriber: pt CM Name: Colletta [place]      Phone#: [phone]     Fax#: Epic access Pre-Cert#: [id number] approved for 5 days      Employer:  Benefits:  Phone #: [phone]     Name: 12/1 Eff. Date: 03/24/2019     Deduct: none      Out of Pocket Max: $3400       CIR: $295 co pay per day days 1 until 6      SNF: no copay days 1 until 20; $178 co pay per day days 21 until 100 Outpatient: $15 per visit     Co-Pay: visits per medical neccesity Home Health: 100%      Co-Pay: visits per medical neccesity DME: 80%     Co-Pay: 20% Providers: in network  SECONDARY: none      Policy#:       Phone#:   Development worker, community:       Phone#:   The Engineer, petroleum" for patients in Inpatient Rehabilitation Facilities with attached "Privacy Act [place] Records" was provided and verbally reviewed with: Patient and Family  Emergency Contact Information Contact Information    Name Relation Home Work Mobile   Kelayres Spouse [phone]  [phone]     Current Medical History  Patient Admitting Diagnosis: CVA  History of Present Illness: 86 year old right-handed male with history of hypertension, chronic back pain maintained on Celebrex as well as Neurontin with baclofen and hydrocodone as needed, hyperlipidemia and tobacco abuse.   Presented 02/20/2020 with dysarthria and left-sided weakness.  CT of the head showed mild diffuse cortical atrophy.  Low density noted in the region of the right cerebellar peduncle concerning for infarction of indeterminate age.  Patient did not receive TPA.  MRI showed area of abnormal signal within the right middle cerebellar  peduncle.  CT angiogram of head and neck no emergent findings no evidence of cerebellar branch occlusion.  At least 70% left ICA origin stenosis.  Echocardiogram with ejection fraction of 30 to 35%.  Noted severe concentric left ventricular hypertrophy.  Currently maintained on aspirin and Plavix for CVA prophylaxis x3 weeks then aspirin alone.  Tolerating a regular diet.   Past Medical History  Past Medical History:  Diagnosis Date  . Arthritis   . Complication of anesthesia    "had the shivers" after surgery  . Depression   . Dysrhythmia    "extra heart beat"  . GERD (gastroesophageal reflux disease)   . History of hiatal hernia   . Hypercholesteremia   . Hypertension   . Lumbar spinal stenosis   . RLS (restless legs syndrome)    takes ativan as needed    Family History  family history includes Hypertension in an other family member; Stroke in his mother.  Prior Rehab/Hospitalizations:  Has the patient had prior rehab or hospitalizations prior to admission? Yes  Has the patient had major surgery during 100 days prior to admission? No  Current Medications   Current Facility-Administered  Medications:  .  0.9 %  sodium chloride infusion, , Intravenous, Continuous, Doutova, Anastassia, MD, Last Rate: 75 mL/hr at 02/21/20 0429, New Bag at 02/21/20 0429 .  acetaminophen (TYLENOL) tablet 650 mg, 650 mg, Oral, Q4H PRN **OR** acetaminophen (TYLENOL) 160 MG/5ML solution 650 mg, 650 mg, Per Tube, Q4H PRN **OR** acetaminophen (TYLENOL) suppository 650 mg, 650 mg, Rectal, Q4H PRN, Doutova, Anastassia, MD .  aspirin EC tablet 81 mg, 81 mg, Oral, Daily, Hosie Poisson, MD, 81 mg at 02/22/20 1001 .  citalopram (CELEXA) tablet 10 mg, 10 mg, Oral, Daily, Doutova, Anastassia, MD, 10 mg at 02/22/20 1001 .  clopidogrel (PLAVIX) tablet 75 mg, 75 mg, Oral, Daily, Karleen Hampshire, Vijaya, MD .  ezetimibe (ZETIA) tablet 10 mg, 10 mg, Oral, QPM, Doutova, Anastassia, MD, 10 mg at 02/21/20 1818 .  gabapentin  (NEURONTIN) tablet 600 mg, 600 mg, Oral, TID, Hosie Poisson, MD, 600 mg at 02/22/20 1001 .  HYDROcodone-acetaminophen (NORCO) 7.5-325 MG per tablet 1 tablet, 1 tablet, Oral, Q8H PRN, Doutova, Anastassia, MD, 1 tablet at 02/22/20 1005 .  LORazepam (ATIVAN) tablet 1 mg, 1 mg, Oral, Q8H PRN, Hosie Poisson, MD, 1 mg at 02/21/20 2136 .  pantoprazole (PROTONIX) EC tablet 40 mg, 40 mg, Oral, Daily, Doutova, Anastassia, MD, 40 mg at 02/22/20 1001 .  pravastatin (PRAVACHOL) tablet 20 mg, 20 mg, Oral, q1800, Rosalin Hawking, MD .  tamsulosin [facility]) capsule 0.4 mg, 0.4 mg, Oral, Daily, Doutova, Anastassia, MD, 0.4 mg at 02/22/20 1001  Patients Current Diet:  Diet Order            Diet - low sodium heart healthy           Diet Heart Room service appropriate? Yes; Fluid consistency: Thin  Diet effective now                 Precautions / Restrictions Precautions Precautions: Fall Restrictions Weight Bearing Restrictions: No   Has the patient had 2 or more falls or a fall with injury in the past year?No  Prior Activity Level Community (5-7x/wk): Mod I with cane; drove, very independent and active  Prior Functional Level Prior Function Level of Independence: Independent with assistive device(s) Comments: Independent with all mobility with use of hurrycane in R hand and int 3-wheeled walker in morning. Patient was still driving.   Self Care: Did the patient need help bathing, dressing, using the toilet or eating?  Independent  Indoor Mobility: Did the patient need assistance with walking from room to room (with or without device)? Independent  Stairs: Did the patient need assistance with internal or external stairs (with or without device)? Independent  Functional Cognition: Did the patient need help planning regular tasks such as shopping or remembering to take medications? Independent  Home Assistive Devices / Equipment Home Assistive Devices/Equipment: Eyeglasses, Radio producer (specify quad or  straight) Home Equipment: Other (comment), Wheelchair - manual, Hand held shower head, Grab bars - tub/shower, Walker - 2 wheels (Hurry cane, 3-wheeled walker w/o seat)  Prior Device Use: Indicate devices/aids used by the patient prior to current illness, exacerbation or injury? cane, sometimes rollator when first up in morning due to back pain  Current Functional Level Cognition  Overall Cognitive Status: Impaired/Different from baseline Current Attention Level: Sustained Orientation Level: Oriented to person, Oriented to place, Oriented to time Following Commands: Follows one step commands inconsistently, Follows one step commands with increased time Safety/Judgement: Decreased awareness of safety, Decreased awareness of deficits General Comments: Pt impulsive to stand when directed  otherwise. Pt requires repeated cues to maintain safety by remaining proximal to RW. Pt denies LOB and majority of deficits and requires education to acknowledge them.    Extremity Assessment (includes Sensation/Coordination)  Upper Extremity Assessment: Defer to OT evaluation LUE Sensation: WNL LUE Coordination: decreased fine motor, decreased gross motor  Lower Extremity Assessment: RLE deficits/detail, LLE deficits/detail RLE Deficits / Details: MMT score of 4+ to 5 grossly throughout RLE Sensation: WNL (light touch and dynamic proprio intact) RLE Coordination: decreased fine motor, decreased gross motor (dysmetria and dysdiadochokinesia noted) LLE Deficits / Details: MMT score of 4+ to 5 grossly throughout LLE Sensation: WNL (light touch and dynamic proprio intact) LLE Coordination: WNL    ADLs  Overall ADL's : Needs assistance/impaired Grooming: Wash/dry hands, Oral care, Min guard, Standing Grooming Details (indicate cue type and reason): Patient with difficulty organizing and sequencing oral hygiene and hand washing tasks requiring verbal cues. Patient very impulsive with low frustration tolerance.  Min guard to steady without use of AD.  Lower Body Dressing: Min guard, Sit to/from stand Lower Body Dressing Details (indicate cue type and reason): Supervision A to doff/don footwear and Min guard to don LB clothing in sitting/standing for safety 2/2 impulsivity and decreased safety awareness.  Toilet Transfer: Magazine features editor Details (indicate cue type and reason): Min guard for safety 2/2 impulsivity and decreased safety awareness.  Functional mobility during ADLs: Min guard General ADL Comments: Min guard with short-distance ambulation in room 2/2 decreased safety awareness and impulsivity.     Mobility  Overal bed mobility: Needs Assistance Bed Mobility: Supine to Sit, Sit to Supine Supine to sit: Supervision Sit to supine: Supervision General bed mobility comments: Pt able to transition supine <> sit with quick movements with bed flat and no use of bed rails, supervision for safety.    Transfers  Overall transfer level: Needs assistance Equipment used: Rolling walker (2 wheeled) Transfers: Sit to/from Stand Sit to Stand: Min assist Stand pivot transfers: Min guard, Min assist General transfer comment: Pt impulsive in coming to stand when directed to remain seated, minA for steadying due to posterior trunk sway, with pt denying any LOB.    Ambulation / Gait / Stairs / Wheelchair Mobility  Ambulation/Gait Ambulation/Gait assistance: Herbalist (Feet): 115 Feet Assistive device: Rolling walker (2 wheeled) Gait Pattern/deviations: Step-through pattern, Decreased stride length, Decreased dorsiflexion - right, Ataxic, Narrow base of support, Decreased dorsiflexion - left General Gait Details: Ambulates with ataxic movements resulting in poor feet clearance with gait and intermittent tripping and LOB, resulting in minA to recover. Cued pt to remain within RW, momentary success. Verbal and visual cues provided to improve heel strike with mod success. Pt looking  inferiorly majority of gait bout.  Gait velocity: decreased Gait velocity interpretation: 1.31 - 2.62 ft/sec, indicative of limited community ambulator Stairs: Yes Stairs assistance: Min assist, Mod assist Stair Management: One rail Right, One rail Left, Alternating pattern Number of Stairs: 4 General stair comments: Ascending with use of R hand rail and descending with L, requiring repeated cues for proper hand placement to simulate home. Displays sequencing and coordination deficits with poor R foot clearance, placing him at risk for falls and him requiring min-modA to maintain his safety. Several mini-tripping bouts of stairs due to poor foot clearance. Extensive cues provided to improve deficits, with no success.    Posture / Balance Dynamic Sitting Balance Sitting balance - Comments: No LOB sitting statically EOB, supervision for safety. Balance Overall balance  assessment: Needs assistance Sitting-balance support: Feet supported Sitting balance-Leahy Scale: Good Sitting balance - Comments: No LOB sitting statically EOB, supervision for safety. Standing balance support: Bilateral upper extremity supported, During functional activity, No upper extremity supported Standing balance-Leahy Scale: Fair Standing balance comment: 2 UE support on RW majority of time but intermittent impulsive release of RW and posterior LOB, minA to recover balance. Standardized Balance Assessment Standardized Balance Assessment : Dynamic Gait Index Dynamic Gait Index Level Surface: Moderate Impairment Change in Gait Speed: Moderate Impairment Gait with Horizontal Head Turns: Moderate Impairment Gait with Vertical Head Turns: Moderate Impairment Gait and Pivot Turn: Moderate Impairment Step Over Obstacle: Mild Impairment Step Around Obstacles: Severe Impairment Steps: Mild Impairment Total Score: 9    Special needs/care consideration Decreased safety awareness and awareness of his deficits Impulsive and  LE ataxia     Previous Home Environment  Living Arrangements: Spouse/significant other  Lives With: Spouse Available Help at Discharge: Available 24 hours/day Type of Home: House Home Layout: One level Home Access: Stairs to enter Entrance Stairs-Rails: Right Entrance Stairs-Number of Steps: 3 Bathroom Shower/Tub: Gaffer, Charity fundraiser: Programmer, systems: Yes Home Care Services: No  Discharge Living Setting Plans for Discharge Living Setting: Patient's home, Lives with (comment) (wife) Type of Home at Discharge: House Discharge Home Layout: One level Discharge Home Access: Stairs to enter Entrance Stairs-Rails: Right Entrance Stairs-Number of Steps: 3 Discharge Bathroom Shower/Tub: Walk-in shower Discharge Bathroom Toilet: Standard Discharge Bathroom Accessibility: Yes How Accessible: Accessible via walker Does the patient have any problems obtaining your medications?: No  Social/Family/Support Systems Contact Information: wife, [name] Anticipated Caregiver: wife Anticipated Ambulance person Information: see above Ability/Limitations of Caregiver: no limitations Caregiver Availability: 24/7 Discharge Plan Discussed with Primary Caregiver: Yes Is Caregiver In Agreement with Plan?: Yes Does Caregiver/Family have Issues with Lodging/Transportation while Pt is in Rehab?: No  Goals Patient/Family Goal for Rehab: Mod I to supervision with PT and OT Expected length of stay: ELOS 7 to 11 days, but doubt patient will agree to that ELOS Additional Information: Decreased safety awareness and awareness of his deficits Pt/Family Agrees to Admission and willing to participate: Yes Program Orientation Provided & Reviewed with Pt/Caregiver Including Roles  & Responsibilities: Yes  Decrease burden of Care through IP rehab admission: n/a  Possible need for SNF placement upon discharge:not anticipated   Patient Condition: This patient's condition  remains as documented in the consult dated 02/21/2020, in which the Rehabilitation Physician determined and documented that the patient's condition is appropriate for intensive rehabilitative care in an inpatient rehabilitation facility. Will admit to inpatient rehab today.  Preadmission Screen Completed By:  Cleatrice Burke, RN, 02/22/2020 10:58 AM ______________________________________________________________________   Discussed status with Dr. Dagoberto Ligas on 02/22/2020 at  1107 and received approval for admission today.  Admission Coordinator:  Cleatrice Burke, time 7902 Date 02/22/2020

## 2020-02-22 NOTE — Progress Notes (Signed)
Cristina Gong, RN  Rehab Admission Coordinator  Physical Medicine and Rehabilitation  PMR Pre-admission     Addendum  Date of Service:  02/22/2020 10:58 AM      Related encounter: ED to Hosp-Admission (Discharged) from 02/20/2020 in [facility]       Show:Clear all [x] Manual[x] Template[x] Copied  Added by: [x] Cristina Gong, RN  [] Hover for details PMR Admission Coordinator Pre-Admission Assessment   Patient: Ruben Barrera is an 86 y.o., male MRN: [id number] DOB: 1932-12-23 Height:   Weight: 84 kg                                                                                                                                                  Insurance Information HMO:     PPO: yes     PCP:      IPA:      80/20:      OTHER:  PRIMARY: [employer]      Policy#: [id number]      Subscriber: pt CM Name: Colletta [place]      Phone#: [phone]     Fax#: Epic access Pre-Cert#: [id number] approved for 5 days      Employer:  Benefits:  Phone #: [phone]     Name: 12/1 Eff. Date: 03/24/2019     Deduct: none      Out of Pocket Max: $3400       CIR: $295 co pay per day days 1 until 6      SNF: no copay days 1 until 20; $178 co pay per day days 21 until 100 Outpatient: $15 per visit     Co-Pay: visits per medical neccesity Home Health: 100%      Co-Pay: visits per medical neccesity DME: 80%     Co-Pay: 20% Providers: in network  SECONDARY: none      Policy#:       Phone#:    Development worker, community:       Phone#:    The Engineer, petroleum" for patients in Inpatient Rehabilitation Facilities with attached "Privacy Act [place] Records" was provided and verbally reviewed with: Patient and Family   Emergency Contact Information         Contact Information     Name Relation Home Work Mobile    [place] Spouse [phone]   [phone]       Current Medical History  Patient Admitting Diagnosis: CVA  History of Present  Illness: 86 year old right-handed male with history of hypertension, chronic back pain maintained on Celebrex as well as Neurontin with baclofen and hydrocodone as needed, hyperlipidemia and tobacco abuse.   Presented 02/20/2020 with dysarthria and left-sided weakness.  CT of the head showed mild diffuse cortical atrophy.  Low density noted in the region of the right cerebellar peduncle concerning for infarction of indeterminate age.  Patient did not receive TPA.  MRI showed area of abnormal signal within the right middle cerebellar peduncle.  CT angiogram of head and neck no emergent findings no evidence of cerebellar branch occlusion.  At least 70% left ICA origin stenosis.  Echocardiogram with ejection fraction of 30 to 35%.  Noted severe concentric left ventricular hypertrophy.  Currently maintained on aspirin and Plavix for CVA prophylaxis x3 weeks then aspirin alone.  Tolerating a regular diet.    Past Medical History      Past Medical History:  Diagnosis Date  . Arthritis    . Complication of anesthesia      "had the shivers" after surgery  . Depression    . Dysrhythmia      "extra heart beat"  . GERD (gastroesophageal reflux disease)    . History of hiatal hernia    . Hypercholesteremia    . Hypertension    . Lumbar spinal stenosis    . RLS (restless legs syndrome)      takes ativan as needed      Family History  family history includes Hypertension in an other family member; Stroke in his mother.   Prior Rehab/Hospitalizations:  Has the patient had prior rehab or hospitalizations prior to admission? Yes   Has the patient had major surgery during 100 days prior to admission? No   Current Medications    Current Facility-Administered Medications:  .  0.9 %  sodium chloride infusion, , Intravenous, Continuous, Doutova, Anastassia, MD, Last Rate: 75 mL/hr at 02/21/20 0429, New Bag at 02/21/20 0429 .  acetaminophen (TYLENOL) tablet 650 mg, 650 mg, Oral, Q4H PRN **OR**  acetaminophen (TYLENOL) 160 MG/5ML solution 650 mg, 650 mg, Per Tube, Q4H PRN **OR** acetaminophen (TYLENOL) suppository 650 mg, 650 mg, Rectal, Q4H PRN, Doutova, Anastassia, MD .  aspirin EC tablet 81 mg, 81 mg, Oral, Daily, Hosie Poisson, MD, 81 mg at 02/22/20 1001 .  citalopram (CELEXA) tablet 10 mg, 10 mg, Oral, Daily, Doutova, Anastassia, MD, 10 mg at 02/22/20 1001 .  clopidogrel (PLAVIX) tablet 75 mg, 75 mg, Oral, Daily, Karleen Hampshire, Vijaya, MD .  ezetimibe (ZETIA) tablet 10 mg, 10 mg, Oral, QPM, Doutova, Anastassia, MD, 10 mg at 02/21/20 1818 .  gabapentin (NEURONTIN) tablet 600 mg, 600 mg, Oral, TID, Hosie Poisson, MD, 600 mg at 02/22/20 1001 .  HYDROcodone-acetaminophen (NORCO) 7.5-325 MG per tablet 1 tablet, 1 tablet, Oral, Q8H PRN, Doutova, Anastassia, MD, 1 tablet at 02/22/20 1005 .  LORazepam (ATIVAN) tablet 1 mg, 1 mg, Oral, Q8H PRN, Hosie Poisson, MD, 1 mg at 02/21/20 2136 .  pantoprazole (PROTONIX) EC tablet 40 mg, 40 mg, Oral, Daily, Doutova, Anastassia, MD, 40 mg at 02/22/20 1001 .  pravastatin (PRAVACHOL) tablet 20 mg, 20 mg, Oral, q1800, Rosalin Hawking, MD .  tamsulosin [facility]) capsule 0.4 mg, 0.4 mg, Oral, Daily, Doutova, Anastassia, MD, 0.4 mg at 02/22/20 1001   Patients Current Diet:     Diet Order                      Diet - low sodium heart healthy              Diet Heart Room service appropriate? Yes; Fluid consistency: Thin  Diet effective now                      Precautions / Restrictions Precautions Precautions: Fall Restrictions Weight Bearing Restrictions: No    Has the patient had 2  or more falls or a fall with injury in the past year?No   Prior Activity Level Community (5-7x/wk): Mod I with cane; drove, very independent and active   Prior Functional Level Prior Function Level of Independence: Independent with assistive device(s) Comments: Independent with all mobility with use of hurrycane in R hand and int 3-wheeled walker in morning. Patient was  still driving.    Self Care: Did the patient need help bathing, dressing, using the toilet or eating?  Independent   Indoor Mobility: Did the patient need assistance with walking from room to room (with or without device)? Independent   Stairs: Did the patient need assistance with internal or external stairs (with or without device)? Independent   Functional Cognition: Did the patient need help planning regular tasks such as shopping or remembering to take medications? Independent   Home Assistive Devices / Equipment Home Assistive Devices/Equipment: Eyeglasses, Radio producer (specify quad or straight) Home Equipment: Other (comment), Wheelchair - manual, Hand held shower head, Grab bars - tub/shower, Walker - 2 wheels (Hurry cane, 3-wheeled walker w/o seat)   Prior Device Use: Indicate devices/aids used by the patient prior to current illness, exacerbation or injury? cane, sometimes rollator when first up in morning due to back pain   Current Functional Level Cognition   Overall Cognitive Status: Impaired/Different from baseline Current Attention Level: Sustained Orientation Level: Oriented to person, Oriented to place, Oriented to time Following Commands: Follows one step commands inconsistently, Follows one step commands with increased time Safety/Judgement: Decreased awareness of safety, Decreased awareness of deficits General Comments: Pt impulsive to stand when directed otherwise. Pt requires repeated cues to maintain safety by remaining proximal to RW. Pt denies LOB and majority of deficits and requires education to acknowledge them.    Extremity Assessment (includes Sensation/Coordination)   Upper Extremity Assessment: Defer to OT evaluation LUE Sensation: WNL LUE Coordination: decreased fine motor, decreased gross motor  Lower Extremity Assessment: RLE deficits/detail, LLE deficits/detail RLE Deficits / Details: MMT score of 4+ to 5 grossly throughout RLE Sensation: WNL (light touch  and dynamic proprio intact) RLE Coordination: decreased fine motor, decreased gross motor (dysmetria and dysdiadochokinesia noted) LLE Deficits / Details: MMT score of 4+ to 5 grossly throughout LLE Sensation: WNL (light touch and dynamic proprio intact) LLE Coordination: WNL     ADLs   Overall ADL's : Needs assistance/impaired Grooming: Wash/dry hands, Oral care, Min guard, Standing Grooming Details (indicate cue type and reason): Patient with difficulty organizing and sequencing oral hygiene and hand washing tasks requiring verbal cues. Patient very impulsive with low frustration tolerance. Min guard to steady without use of AD.  Lower Body Dressing: Min guard, Sit to/from stand Lower Body Dressing Details (indicate cue type and reason): Supervision A to doff/don footwear and Min guard to don LB clothing in sitting/standing for safety 2/2 impulsivity and decreased safety awareness.  Toilet Transfer: Magazine features editor Details (indicate cue type and reason): Min guard for safety 2/2 impulsivity and decreased safety awareness.  Functional mobility during ADLs: Min guard General ADL Comments: Min guard with short-distance ambulation in room 2/2 decreased safety awareness and impulsivity.      Mobility   Overal bed mobility: Needs Assistance Bed Mobility: Supine to Sit, Sit to Supine Supine to sit: Supervision Sit to supine: Supervision General bed mobility comments: Pt able to transition supine <> sit with quick movements with bed flat and no use of bed rails, supervision for safety.     Transfers   Overall transfer  level: Needs assistance Equipment used: Rolling walker (2 wheeled) Transfers: Sit to/from Stand Sit to Stand: Min assist Stand pivot transfers: Min guard, Min assist General transfer comment: Pt impulsive in coming to stand when directed to remain seated, minA for steadying due to posterior trunk sway, with pt denying any LOB.     Ambulation / Gait / Stairs /  Wheelchair Mobility   Ambulation/Gait Ambulation/Gait assistance: Herbalist (Feet): 115 Feet Assistive device: Rolling walker (2 wheeled) Gait Pattern/deviations: Step-through pattern, Decreased stride length, Decreased dorsiflexion - right, Ataxic, Narrow base of support, Decreased dorsiflexion - left General Gait Details: Ambulates with ataxic movements resulting in poor feet clearance with gait and intermittent tripping and LOB, resulting in minA to recover. Cued pt to remain within RW, momentary success. Verbal and visual cues provided to improve heel strike with mod success. Pt looking inferiorly majority of gait bout.  Gait velocity: decreased Gait velocity interpretation: 1.31 - 2.62 ft/sec, indicative of limited community ambulator Stairs: Yes Stairs assistance: Min assist, Mod assist Stair Management: One rail Right, One rail Left, Alternating pattern Number of Stairs: 4 General stair comments: Ascending with use of R hand rail and descending with L, requiring repeated cues for proper hand placement to simulate home. Displays sequencing and coordination deficits with poor R foot clearance, placing him at risk for falls and him requiring min-modA to maintain his safety. Several mini-tripping bouts of stairs due to poor foot clearance. Extensive cues provided to improve deficits, with no success.     Posture / Balance Dynamic Sitting Balance Sitting balance - Comments: No LOB sitting statically EOB, supervision for safety. Balance Overall balance assessment: Needs assistance Sitting-balance support: Feet supported Sitting balance-Leahy Scale: Good Sitting balance - Comments: No LOB sitting statically EOB, supervision for safety. Standing balance support: Bilateral upper extremity supported, During functional activity, No upper extremity supported Standing balance-Leahy Scale: Fair Standing balance comment: 2 UE support on RW majority of time but intermittent impulsive  release of RW and posterior LOB, minA to recover balance. Standardized Balance Assessment Standardized Balance Assessment : Dynamic Gait Index Dynamic Gait Index Level Surface: Moderate Impairment Change in Gait Speed: Moderate Impairment Gait with Horizontal Head Turns: Moderate Impairment Gait with Vertical Head Turns: Moderate Impairment Gait and Pivot Turn: Moderate Impairment Step Over Obstacle: Mild Impairment Step Around Obstacles: Severe Impairment Steps: Mild Impairment Total Score: 9     Special needs/care consideration Decreased safety awareness and awareness of his deficits Impulsive and LE ataxia        Previous Home Environment  Living Arrangements: Spouse/significant other  Lives With: Spouse Available Help at Discharge: Available 24 hours/day Type of Home: House Home Layout: One level Home Access: Stairs to enter Entrance Stairs-Rails: Right Entrance Stairs-Number of Steps: 3 Bathroom Shower/Tub: Gaffer, Charity fundraiser: Associate Professor Accessibility: Yes Home Care Services: No   Discharge Living Setting Plans for Discharge Living Setting: Patient's home, Lives with (comment) (wife) Type of Home at Discharge: House Discharge Home Layout: One level Discharge Home Access: Stairs to enter Entrance Stairs-Rails: Right Entrance Stairs-Number of Steps: 3 Discharge Bathroom Shower/Tub: Walk-in shower Discharge Bathroom Toilet: Standard Discharge Bathroom Accessibility: Yes How Accessible: Accessible via walker Does the patient have any problems obtaining your medications?: No   Social/Family/Support Systems Contact Information: wife, [name] Anticipated Caregiver: wife Anticipated Ambulance person Information: see above Ability/Limitations of Caregiver: no limitations Caregiver Availability: 24/7 Discharge Plan Discussed with Primary Caregiver: Yes Is Caregiver In Agreement with Plan?: Yes Does Caregiver/Family  have Issues with  Lodging/Transportation while Pt is in Rehab?: No   Goals Patient/Family Goal for Rehab: Mod I to supervision with PT and OT Expected length of stay: ELOS 7 to 11 days, but doubt patient will agree to that ELOS Additional Information: Decreased safety awareness and awareness of his deficits Pt/Family Agrees to Admission and willing to participate: Yes Program Orientation Provided & Reviewed with Pt/Caregiver Including Roles  & Responsibilities: Yes   Decrease burden of Care through IP rehab admission: n/a   Possible need for SNF placement upon discharge:not anticipated     Patient Condition: This patient's condition remains as documented in the consult dated 02/21/2020, in which the Rehabilitation Physician determined and documented that the patient's condition is appropriate for intensive rehabilitative care in an inpatient rehabilitation facility. Will admit to inpatient rehab today.   Preadmission Screen Completed By:  Cleatrice Burke, RN, 02/22/2020 10:58 AM ______________________________________________________________________   Discussed status with Dr. Dagoberto Ligas on 02/22/2020 at  1107 and received approval for admission today.   Admission Coordinator:  Cleatrice Burke, time 3212 Date 02/22/2020         Cosigned by: Courtney Heys, MD at 02/22/2020 11:55 AM  Revision History                          Note Details  Author Cristina Gong, RN File Time 02/22/2020 11:44 AM  Author Type Rehab Admission Coordinator Status Addendum  Last Editor Cristina Gong, RN Service Physical Medicine and [place] # [id number] Admit Date 02/22/2020

## 2020-02-22 NOTE — Consult Note (Addendum)
Cardiology Consultation:   Patient ID: Ruben Barrera MRN: [id number]; DOB: September 07, 1932  Admit date: 02/22/2020 Date of Consult: 02/22/2020  Primary Care Provider: Lajean Barrera, [facility] Cardiologist: Ruben Dawley, MD  [facility] [facility] Electrophysiologist:  None    Patient Profile:   Ruben Barrera is a 87 y.o. male with a hx of depression, GERD, HLD, HTN, radiculopathy, no CAD- normal nuc study in 2016, now admitted with visual and gait disturbance and CVA who is being seen today for the evaluation of abnormal Echo with low EF at the request of Ruben Barrera.  History of Present Illness:   Ruben Barrera saw Ruben Barrera in 2016 for DOE and nuc stress test was neg for ischemia.  His other hx as above.  Now admitted with visual and gait disturbance and found to have Right cerebellar peduncle stroke and plan for admit to CIR for rehab.  In course of work up echo was done with EF 30-35%, global hypokinesis, LV internal cavity size ws mildly dilated, there is severe concentric LVH. .   RV is normal.  LA severely dilated Mild MR,  Mild to mod AS this may be underestimated in the setting of LV function.  He tells me he has had a murmur since high school.   He denies any chest pain.  He is active, riding recumbent bike 12 miles per day, walking and lifting weights.  He denies SOB but after discussing ER visit 05/24/19 after receiving second covid vaccine he had TIA and since then he has more SOB than he likes.   He has skipped beats his MD has told him about but he is not aware of them.  On EKG 05/2019 he had more freq PVCs and on recent tele he had freq PVCs up to 3 beats of NSVT..    EKG:  The EKG was personally reviewed and demonstrates:  02/20/20 SR with PACs and PVCs non specific T wave abnormality in March 2021 freq PVCs  Telemetry:  Telemetry was personally reviewed and demonstrates:  SR with freg PVC and salvos   CXR with enlarge cardiac silhouette no CHF or effusion.  Na 141, K+ 3.9, BUN  23, Cr 1.0  LDL 122 TG 199 HDL 42  hgb 16.3 A1C 5.6   TSH in June was 0.81  COVID neg   BP 138/93 to 107/90 P 51 to 89 afebrile   Past Medical History:  Diagnosis Date  . Arthritis   . Complication of anesthesia    "had the shivers" after surgery  . Depression   . Dysrhythmia    "extra heart beat"  . GERD (gastroesophageal reflux disease)   . History of hiatal hernia   . Hypercholesteremia   . Hypertension   . Lumbar spinal stenosis   . RLS (restless legs syndrome)    takes ativan as needed    Past Surgical History:  Procedure Laterality Date  . BACK SURGERY    . LUMBAR LAMINECTOMY/DECOMPRESSION MICRODISCECTOMY N/A 08/28/2015   Procedure: Lumbar Four-Five decompressive lumbar laminectomy;  Surgeon: Ruben Gamma, MD;  Location: [facility];  Service: Neurosurgery;  Laterality: N/A;  . REPLACEMENT TOTAL KNEE BILATERAL Bilateral 11/05/2003  . TOE AMPUTATION Right 2010   second toe  . TONSILLECTOMY       Home Medications:  Prior to Admission medications   Medication Sig Start Date End Date Taking? Authorizing Provider  baclofen (LIORESAL) 10 MG tablet Take 10 mg by mouth 3 (three) times daily as needed for muscle  spasms.  02/05/20  Yes [provider]  citalopram (CELEXA) 10 MG tablet Take 10 mg by mouth daily. 06/18/19  Yes [provider]  ezetimibe (ZETIA) 10 MG tablet Take 10 mg by mouth every evening.  08/06/15  Yes [provider]  fluticasone (FLONASE) 50 MCG/ACT nasal spray Place 1 spray into both nostrils daily as needed for allergies.    Yes [provider]  gabapentin (NEURONTIN) 600 MG tablet Take 600 mg by mouth 3 (three) times daily.   Yes [provider]  HYDROcodone-acetaminophen (NORCO) 7.5-325 MG tablet Take 1 tablet by mouth every 8 (eight) hours as needed for moderate pain.   Yes [provider]  LORazepam (ATIVAN) 1 MG tablet Take 1 mg by mouth daily.    Yes [provider]  MULTIPLE VITAMIN PO  Take 1 tablet by mouth daily.    Yes [provider]  omeprazole (PRILOSEC) 20 MG capsule Take 20 mg by mouth daily.   Yes [provider]  tamsulosin (FLOMAX) 0.4 MG CAPS capsule Take 0.4 mg by mouth daily.    Yes [provider]  aspirin EC 81 MG EC tablet Take 1 tablet (81 mg total) by mouth daily. Swallow whole. 02/22/20   Ruben Poisson, MD  clopidogrel (PLAVIX) 75 MG tablet Take 1 tablet (75 mg total) by mouth daily for 21 days. 02/22/20 03/14/20  Ruben Poisson, MD  pravastatin (PRAVACHOL) 20 MG tablet Take 1 tablet (20 mg total) by mouth daily at 6 PM. 02/22/20   Ruben Poisson, MD    Inpatient Medications: Scheduled Meds: . [START ON 02/23/2020] aspirin EC  81 mg Oral Daily  . [START ON 02/23/2020] citalopram  10 mg Oral Daily  . [START ON 02/23/2020] clopidogrel  75 mg Oral Daily  . ezetimibe  10 mg Oral QPM  . gabapentin  600 mg Oral TID  . [START ON 02/23/2020] pantoprazole  40 mg Oral Daily  . pravastatin  20 mg Oral q1800  . [START ON 02/23/2020] tamsulosin  0.4 mg Oral Daily   Continuous Infusions:  PRN Meds: acetaminophen **OR** acetaminophen (TYLENOL) oral liquid 160 mg/5 mL **OR** acetaminophen, HYDROcodone-acetaminophen, LORazepam  Allergies:    Allergies  Allergen Reactions  . Simvastatin Other (See Comments)    Leg pains   . Other Other (See Comments)    UNSPECIFIED REACTION  Reaction to anesthesia per patient    Social History:   Social History   Socioeconomic History  . Marital status: Married    Spouse name: Not on file  . Number of children: Not on file  . Years of education: Not on file  . Highest education level: Not on file  Occupational History  . Not on file  Tobacco Use  . Smoking status: Current Every Day Smoker    Types: Cigars  . Smokeless tobacco: Never Used  . Tobacco comment: quit 1972  Substance and Sexual Activity  . Alcohol use: Yes    Alcohol/week: 0.0 standard drinks    Comment: occasionally beer or scotch   . Drug use: No    Comment: 1 cigar per day, pt states he does not inhale   . Sexual activity: Not on file  Other Topics Concern  . Not on file  Social History Narrative   Right handed   Lives with wife one story home   Social Determinants of Health   Financial Resource Strain:   . Difficulty of Paying Living Expenses: Not on file  Food Insecurity:   .  Worried About Charity fundraiser in the Last Year: Not on file  . Ran Out of Food in the Last Year: Not on file  Transportation Needs:   . Lack of Transportation (Medical): Not on file  . Lack of Transportation (Non-Medical): Not on file  Physical Activity:   . Days of Exercise per Week: Not on file  . Minutes of Exercise per Session: Not on file  Stress:   . Feeling of Stress : Not on file  Social Connections:   . Frequency of Communication with Friends and Family: Not on file  . Frequency of Social Gatherings with Friends and Family: Not on file  . Attends Religious Services: Not on file  . Active Member of Clubs or Organizations: Not on file  . Attends Archivist Meetings: Not on file  . Marital Status: Not on file  Intimate Partner Violence:   . Fear of Current or Ex-Partner: Not on file  . Emotionally Abused: Not on file  . Physically Abused: Not on file  . Sexually Abused: Not on file    Family History:    Family History  Problem Relation Age of Onset  . Stroke Mother   . Hypertension Other        family history     ROS:  Please see the history of present illness.  General:no colds or fevers, no weight changes Skin:no rashes or ulcers HEENT:no blurred vision, no congestion CV:see HPI PUL:see HPI GI:no diarrhea constipation or melena, no indigestion GU:no hematuria, no dysuria MS:no joint pain, no claudication Neuro:no syncope, no lightheadedness Endo:no diabetes, no thyroid disease  All other ROS reviewed and negative.     Physical Exam/Data:   Vitals:   02/22/20 1445  BP: 134/76   Pulse: 89  Resp: 20  Temp: (!) 97.5 F (36.4 C)  SpO2: 95%   No intake or output data in the 24 hours ending 02/22/20 1511 Last 3 Weights 02/21/2020 09/19/2019 01/06/2016  Weight (lbs) 185 lb 3 oz 191 lb 3.2 oz 172 lb 9.6 oz  Weight (kg) 84 kg 86.728 kg 78.291 kg     There is no height or weight on file to calculate BMI.  General:  Well nourished, well developed, in no acute distress appears younger than his stated age.  HEENT: normal Lymph: no adenopathy Neck: no JVD Endocrine:  No thryomegaly Vascular: No carotid bruits; pedal pulses 2+ bilaterally   Cardiac:  normal S1, S2; RRR; 2/3 systolic murmur no gallup rub or click Lungs:  clear to auscultation bilaterally, no wheezing, rhonchi or rales  Abd: soft, nontender, no hepatomegaly  Ext: no edema Musculoskeletal:  No deformities, BUE and BLE strength normal and equal Skin: warm and dry  Neuro:  Alert and oriented X 3 MAE follows commands, no focal abnormalities noted Psych:  Normal affect   Relevant CV Studies: Echo 02/21/20 IMPRESSIONS    1. Left ventricular ejection fraction, by estimation, is 30 to 35%. The  left ventricle has moderately decreased function. The left ventricle  demonstrates global hypokinesis. The left ventricular internal cavity size  was mildly dilated. There is severe  concentric left ventricular hypertrophy. Left ventricular diastolic  parameters are indeterminate.  2. Right ventricular systolic function is normal. The right ventricular  size is normal.  3. Left atrial size was severely dilated.  4. The mitral valve is grossly normal. Mild mitral valve regurgitation.  5. The aortic valve is calcified. Aortic valve regurgitation is not  visualized. Mild to moderate  aortic valve stenosis. Aortic valve mean  gradient measures 25.0 mmHg.   Comparison(s): No prior Echocardiogram.   Conclusion(s)/Recommendation(s): LV function is reduced. There is at least  mild to moderate aortic stenosis; this  may be underestimated in the  setting of LV function.   FINDINGS  Left Ventricle: LVMI 197 g/m2  RWT 0.54. Left ventricular ejection fraction, by estimation, is 30 to 35%.  The left ventricle has moderately decreased function. The left ventricle  demonstrates global hypokinesis. The left ventricular internal cavity size  was mildly dilated. There is  severe concentric left ventricular hypertrophy. Left ventricular diastolic  parameters are indeterminate.   Right Ventricle: The right ventricular size is normal. No increase in  right ventricular wall thickness. Right ventricular systolic function is  normal.   Left Atrium: Left atrial size was severely dilated.   Right Atrium: Right atrial size was normal in size.   Pericardium: There is no evidence of pericardial effusion.   Mitral Valve: The mitral valve is grossly normal. There is mild thickening  of the mitral valve leaflet(s). Mild to moderate mitral annular  calcification. Mild mitral valve regurgitation.   Tricuspid Valve: The tricuspid valve is grossly normal. Tricuspid valve  regurgitation is not demonstrated.   Aortic Valve: LV SVI 38. The aortic valve is calcified. Aortic valve  regurgitation is not visualized. Mild to moderate aortic stenosis is  present. Aortic valve mean gradient measures 25.0 mmHg. Aortic valve peak  gradient measures 39.1 mmHg. Aortic valve  area, by VTI measures 1.32 cm.   Pulmonic Valve: The pulmonic valve was grossly normal. Pulmonic valve  regurgitation is not visualized.   Aorta: The aortic root is normal in size and structure.   Venous: The pulmonary veins were not well visualized. The inferior vena  cava was not well visualized.   IAS/Shunts: The atrial septum is grossly normal.     LEFT VENTRICLE  PLAX 2D  LVIDd:     5.90 cm   Diastology  LVIDs:     5.30 cm   LV e' medial:  3.99 cm/s  LV PW:     1.60 cm   LV E/e' medial: 20.7  LV IVS:    1.30 cm    LV e' lateral:  4.12 cm/s  LVOT diam:   2.45 cm   LV E/e' lateral: 20.0  LV SV:     80  LV SV Index:  40  LVOT Area:   4.71 cm   Laboratory Data:  High Sensitivity Troponin:  No results for input(s): TROPONINIHS in the last 720 hours.   Chemistry Recent Labs  Lab 02/20/20 1243 02/20/20 1305  NA 137 141  K 3.8 3.9  CL 103 103  CO2 26  --   GLUCOSE 104* 102*  BUN 22 23  CREATININE 0.92 1.00  CALCIUM 8.9  --   GFRNONAA >60  --   ANIONGAP 8  --     Recent Labs  Lab 02/20/20 1243  PROT 7.4  ALBUMIN 4.5  AST 21  ALT 15  ALKPHOS 68  BILITOT 0.9   Hematology Recent Labs  Lab 02/20/20 1243 02/20/20 1305  WBC 6.4  --   RBC 5.01  --   HGB 15.9 16.3  HCT 47.5 48.0  MCV 94.8  --   MCH 31.7  --   MCHC 33.5  --   RDW 12.7  --   PLT 247  --    BNPNo results for input(s): BNP, PROBNP in the last 168  hours.  DDimer No results for input(s): DDIMER in the last 168 hours.   Radiology/Studies:  CT Code Stroke CTA Head W/WO contrast  Result Date: 02/21/2020 CLINICAL DATA:  Stroke workup EXAM: CT ANGIOGRAPHY HEAD AND NECK TECHNIQUE: Multidetector CT imaging of the head and neck was performed using the standard protocol during bolus administration of intravenous contrast. Multiplanar CT image reconstructions and MIPs were obtained to evaluate the vascular anatomy. Carotid stenosis measurements (when applicable) are obtained utilizing NASCET criteria, using the distal internal carotid diameter as the denominator. CONTRAST:  79mL OMNIPAQUE IOHEXOL 350 MG/ML SOLN COMPARISON:  CT and brain MRI from yesterday. FINDINGS: CTA NECK FINDINGS Aortic arch: Atheromatous plaque.  Three vessel branching. Right carotid system: Scattered mainly calcified atheromatous plaque primarily at the bifurcation. No flow limiting stenosis, ulceration, or beading. Left carotid system: Calcified plaque primarily at the bifurcation/bulb with at least 70% stenosis as measured on sagittal  reformats. No ulceration or beading. Vertebral arteries: Proximal subclavian atherosclerosis without flow limiting stenosis. The left vertebral artery is dominant. Vertebral arteries are smooth and widely patent to the dura. Skeleton: Advanced, generalized disc and facet degeneration with C3-4 anterolisthesis. Partially covered left glenohumeral joint with intra-articular bodies in the medial recess. Other neck: No masslike or inflammatory finding seen. Upper chest: No acute finding Review of the MIP images confirms the above findings CTA HEAD FINDINGS Anterior circulation: Calcified plaque along the carotid siphons. No branch occlusion, beading, or proximal flow limiting stenosis. Negative for aneurysm. Posterior circulation: Left dominant vertebral artery. Patent bilateral PICA. Patent bilateral superior cerebellar arteries with symmetric enhancement. A faint right AICA is present. Advanced right P2 segment stenosis. High-grade left P3 segment stenosis. No beading or aneurysm. Venous sinuses: Unremarkable in the arterial phase Anatomic variants: None significant Review of the MIP images confirms the above findings IMPRESSION: 1. No emergent finding.  No evident cerebellar branch occlusion. 2. At least 70% left ICA origin stenosis. 3. High-grade right P2 and left P3 atheromatous stenoses. Electronically Signed   By: Monte Fantasia M.D.   On: 02/21/2020 08:13   CT HEAD WO CONTRAST  Result Date: 02/20/2020 CLINICAL DATA:  Slurred speech, blurred vision. EXAM: CT HEAD WITHOUT CONTRAST TECHNIQUE: Contiguous axial images were obtained from the base of the skull through the vertex without intravenous contrast. COMPARISON:  May 24, 2019. FINDINGS: Brain: Mild diffuse cortical atrophy is noted. Mild chronic ischemic white matter disease is noted. No mass effect or midline shift is noted. Ventricular size is within normal limits. There is no evidence of mass lesion or hemorrhage. Low density is noted in the region of  the right cerebellar peduncle concerning for infarction of indeterminate age. Vascular: No hyperdense vessel or unexpected calcification. Skull: Normal. Negative for fracture or focal lesion. Sinuses/Orbits: No acute finding. Other: None. IMPRESSION: Mild diffuse cortical atrophy. Mild chronic ischemic white matter disease. Low density is noted in the region of the right cerebellar peduncle concerning for infarction of indeterminate age. MRI is recommended for further evaluation. Electronically Signed   By: Marijo Conception M.D.   On: 02/20/2020 13:12   CT Code Stroke CTA Neck W/WO contrast  Result Date: 02/21/2020 CLINICAL DATA:  Stroke workup EXAM: CT ANGIOGRAPHY HEAD AND NECK TECHNIQUE: Multidetector CT imaging of the head and neck was performed using the standard protocol during bolus administration of intravenous contrast. Multiplanar CT image reconstructions and MIPs were obtained to evaluate the vascular anatomy. Carotid stenosis measurements (when applicable) are obtained utilizing NASCET criteria, using the distal internal  carotid diameter as the denominator. CONTRAST:  22mL OMNIPAQUE IOHEXOL 350 MG/ML SOLN COMPARISON:  CT and brain MRI from yesterday. FINDINGS: CTA NECK FINDINGS Aortic arch: Atheromatous plaque.  Three vessel branching. Right carotid system: Scattered mainly calcified atheromatous plaque primarily at the bifurcation. No flow limiting stenosis, ulceration, or beading. Left carotid system: Calcified plaque primarily at the bifurcation/bulb with at least 70% stenosis as measured on sagittal reformats. No ulceration or beading. Vertebral arteries: Proximal subclavian atherosclerosis without flow limiting stenosis. The left vertebral artery is dominant. Vertebral arteries are smooth and widely patent to the dura. Skeleton: Advanced, generalized disc and facet degeneration with C3-4 anterolisthesis. Partially covered left glenohumeral joint with intra-articular bodies in the medial recess.  Other neck: No masslike or inflammatory finding seen. Upper chest: No acute finding Review of the MIP images confirms the above findings CTA HEAD FINDINGS Anterior circulation: Calcified plaque along the carotid siphons. No branch occlusion, beading, or proximal flow limiting stenosis. Negative for aneurysm. Posterior circulation: Left dominant vertebral artery. Patent bilateral PICA. Patent bilateral superior cerebellar arteries with symmetric enhancement. A faint right AICA is present. Advanced right P2 segment stenosis. High-grade left P3 segment stenosis. No beading or aneurysm. Venous sinuses: Unremarkable in the arterial phase Anatomic variants: None significant Review of the MIP images confirms the above findings IMPRESSION: 1. No emergent finding.  No evident cerebellar branch occlusion. 2. At least 70% left ICA origin stenosis. 3. High-grade right P2 and left P3 atheromatous stenoses. Electronically Signed   By: Monte Fantasia M.D.   On: 02/21/2020 08:13   MR BRAIN WO CONTRAST  Result Date: 02/20/2020 CLINICAL DATA:  Right-sided weakness, slurred speech, blurry vision EXAM: MRI HEAD WITHOUT CONTRAST TECHNIQUE: Multiplanar, multiecho pulse sequences of the brain and surrounding structures were obtained without intravenous contrast. COMPARISON:  05/24/2019 FINDINGS: Brain: There is area of mildly reduced diffusion within the right brachium pontis measuring approximately 1.4 cm. There is corresponding T2 hyperintensity with rounded appearance. Additional patchy and confluent areas of T2 hyperintensity in the supratentorial white matter are nonspecific but probably reflect moderate to advanced chronic microvascular ischemic changes. Small chronic right cerebellar infarct. There is no intracranial hemorrhage. There is no intracranial mass or significant mass effect. There is no hydrocephalus or extra-axial fluid collection. Prominence of the ventricles and sulci reflects generalized parenchymal volume loss  similar to the prior study. Vascular: Major vessel flow voids at the skull base are preserved. Skull and upper cervical spine: Normal marrow signal is preserved. Sinuses/Orbits: Minor mucosal thickening. Bilateral lens replacements. Other: Sella is unremarkable.  Mastoid air cells are clear. IMPRESSION: Area of abnormal signal within the right middle cerebellar peduncle may reflect acute to subacute infarct. Given rounded appearance, follow-up is recommended ensure appropriate evolution (suggest addition of post contrast imaging at that time). Moderate to advanced chronic microvascular ischemic changes. Small chronic right cerebellar infarct. Electronically Signed   By: Macy Mis M.D.   On: 02/20/2020 15:44   MR BRAIN W CONTRAST  Result Date: 02/20/2020 CLINICAL DATA:  Right middle cerebellar peduncle lesion. EXAM: MRI HEAD WITH CONTRAST TECHNIQUE: Multiplanar, multiecho pulse sequences of the brain and surrounding structures were obtained with intravenous contrast. CONTRAST:  58mL GADAVIST GADOBUTROL 1 MMOL/ML IV SOLN COMPARISON:  Brain MRI without contrast 02/20/2020 FINDINGS: There is no abnormal contrast enhancement at the site of the right middle cerebellar peduncle lesion. There is a punctate focus of contrast enhancement within the posterior right cerebellar hemisphere (series 5, image 43) that is favored to be vascular.  IMPRESSION: 1. No abnormal contrast enhancement at the site of the right middle cerebellar peduncle lesion. 2. Punctate focus of enhancement in the posterior right cerebellar hemispheres favored to be vascular, but attention on follow-up scan in 6-12 weeks recommended. Electronically Signed   By: Ulyses Jarred M.D.   On: 02/20/2020 19:19   DG CHEST PORT 1 VIEW  Result Date: 02/20/2020 CLINICAL DATA:  CVA EXAM: PORTABLE CHEST 1 VIEW COMPARISON:  10/31/2003 FINDINGS: Single frontal view of the chest demonstrates mild enlargement the cardiac silhouette, accentuated by portable AP  technique. No airspace disease, effusion, or pneumothorax. Significant bilateral shoulder osteoarthritis. No acute fracture. IMPRESSION: 1. Enlarged cardiac silhouette. 2. No acute airspace disease. Electronically Signed   By: Randa Ngo M.D.   On: 02/20/2020 23:50   ECHOCARDIOGRAM COMPLETE  Result Date: 02/21/2020    ECHOCARDIOGRAM REPORT   Patient Name:   KRISHANG READING Date of Exam: 02/21/2020 Medical Rec #:  [id number]      Height:       67.0 in Accession #:    [id number]     Weight:       191.2 lb Date of Birth:  16-Mar-1933       BSA:          1.984 m Patient Age:    36 years       BP:           154/104 mmHg Patient Gender: M              HR:           97 bpm. Exam Location:  Inpatient Procedure: 2D Echo, Cardiac Doppler and Color Doppler Indications:    Stroke  History:        Patient has no prior history of Echocardiogram examinations.                 Stroke, Signs/Symptoms:Dyspnea; Risk Factors:Hypertension.  Sonographer:    Roseanna Rainbow RDCS Referring Phys: Bethany  Sonographer Comments: Technically difficult study due to poor echo windows. Patient did not have IV access, and refused to get one. Definity not used. IMPRESSIONS  1. Left ventricular ejection fraction, by estimation, is 30 to 35%. The left ventricle has moderately decreased function. The left ventricle demonstrates global hypokinesis. The left ventricular internal cavity size was mildly dilated. There is severe concentric left ventricular hypertrophy. Left ventricular diastolic parameters are indeterminate.  2. Right ventricular systolic function is normal. The right ventricular size is normal.  3. Left atrial size was severely dilated.  4. The mitral valve is grossly normal. Mild mitral valve regurgitation.  5. The aortic valve is calcified. Aortic valve regurgitation is not visualized. Mild to moderate aortic valve stenosis. Aortic valve mean gradient measures 25.0 mmHg. Comparison(s): No prior Echocardiogram.  Conclusion(s)/Recommendation(s): LV function is reduced. There is at least mild to moderate aortic stenosis; this may be underestimated in the setting of LV function. FINDINGS  Left Ventricle: LVMI 197 g/m2 RWT 0.54. Left ventricular ejection fraction, by estimation, is 30 to 35%. The left ventricle has moderately decreased function. The left ventricle demonstrates global hypokinesis. The left ventricular internal cavity size was mildly dilated. There is severe concentric left ventricular hypertrophy. Left ventricular diastolic parameters are indeterminate. Right Ventricle: The right ventricular size is normal. No increase in right ventricular wall thickness. Right ventricular systolic function is normal. Left Atrium: Left atrial size was severely dilated. Right Atrium: Right atrial size was normal in size. Pericardium: There is no  evidence of pericardial effusion. Mitral Valve: The mitral valve is grossly normal. There is mild thickening of the mitral valve leaflet(s). Mild to moderate mitral annular calcification. Mild mitral valve regurgitation. Tricuspid Valve: The tricuspid valve is grossly normal. Tricuspid valve regurgitation is not demonstrated. Aortic Valve: LV SVI 38. The aortic valve is calcified. Aortic valve regurgitation is not visualized. Mild to moderate aortic stenosis is present. Aortic valve mean gradient measures 25.0 mmHg. Aortic valve peak gradient measures 39.1 mmHg. Aortic valve area, by VTI measures 1.32 cm. Pulmonic Valve: The pulmonic valve was grossly normal. Pulmonic valve regurgitation is not visualized. Aorta: The aortic root is normal in size and structure. Venous: The pulmonary veins were not well visualized. The inferior vena cava was not well visualized. IAS/Shunts: The atrial septum is grossly normal.  LEFT VENTRICLE PLAX 2D LVIDd:         5.90 cm      Diastology LVIDs:         5.30 cm      LV e' medial:    3.99 cm/s LV PW:         1.60 cm      LV E/e' medial:  20.7 LV IVS:         1.30 cm      LV e' lateral:   4.12 cm/s LVOT diam:     2.45 cm      LV E/e' lateral: 20.0 LV SV:         80 LV SV Index:   40 LVOT Area:     4.71 cm  LV Volumes (MOD) LV vol d, MOD A2C: 165.0 ml LV vol d, MOD A4C: 119.5 ml LV vol s, MOD A2C: 111.0 ml LV vol s, MOD A4C: 76.0 ml LV SV MOD A2C:     54.0 ml LV SV MOD A4C:     119.5 ml LV SV MOD BP:      48.8 ml RIGHT VENTRICLE             IVC RV S prime:     18.00 cm/s  IVC diam: 3.10 cm TAPSE (M-mode): 2.3 cm LEFT ATRIUM             Index       RIGHT ATRIUM           Index LA diam:        4.20 cm 2.12 cm/m  RA Area:     12.20 cm LA Vol (A2C):   70.5 ml 35.54 ml/m RA Volume:   24.90 ml  12.55 ml/m LA Vol (A4C):   94.0 ml 47.38 ml/m LA Biplane Vol: 85.4 ml 43.05 ml/m  AORTIC VALVE AV Area (Vmax):    1.22 cm AV Area (Vmean):   1.08 cm AV Area (VTI):     1.32 cm AV Vmax:           312.75 cm/s AV Vmean:          236.500 cm/s AV VTI:            0.608 m AV Peak Grad:      39.1 mmHg AV Mean Grad:      25.0 mmHg LVOT Vmax:         80.90 cm/s LVOT Vmean:        54.400 cm/s LVOT VTI:          0.170 m LVOT/AV VTI ratio: 0.28  AORTA Ao Root diam: 3.20 cm Ao Asc diam:  3.10 cm MITRAL  VALVE MV Area (PHT): 4.97 cm    SHUNTS MV Decel Time: 153 msec    Systemic VTI:  0.17 m MV E velocity: 82.53 cm/s  Systemic Diam: 2.45 cm Rudean Haskell MD Electronically signed by Rudean Haskell MD Signature Date/Time: 02/21/2020/4:33:50 PM    Final      Assessment and Plan:   1. Cardiomyopathy found with CVA work up included Echo with decreased EF. he has no chest pain and some dyspnea - prior tele strips and EKGs with PVCs up to 3 beats of NSVT.  He has had PVCs for some time.  Consider freq PVCs to cause CM, also with TIA after COVID vaccine in March has had some DOE since that time.  He has severe concentric LVH as well.  No tele on CIR will do zio patch for 2 weeks for PVC burden.  Will add losartan now at 12.5 and torpol XL 12.5 .  May need ischemic work up will  decide as outpt  2. Mild to moderate AS  More likely modareate  3. CVA Rt cerebellar Peduncle stroke pt for DAPT for 3 weeks followed by ASA alone.  4. Carotid disease by CT angio of head and neck Left ICA stenosis 70% - to follow with vascular 5. HLD on zetia and statin added  6. HTN was on amlodipine - would be beneficial to add low dose BB for CM and PVCs  Also low dose ARB if BP permits                  For questions or updates, please contact [place] Please consult [email or web address] for contact info under    Signed, Cecilie Kicks, NP  02/22/2020 3:11 PM

## 2020-02-22 NOTE — Telephone Encounter (Signed)
Patient scheduled 03/21/20 at 12:50 pm with Dr. Tomi Likens.

## 2020-02-22 NOTE — Telephone Encounter (Signed)
Patient's wife called and said, "My husband is at [facility] after a stroke sometime over the last few weeks. He will need follow up with Dr. Tomi Likens."  Patient seen previously by Dr. Tomi Likens for a different problem, neuropathy. Routing to clinical staff for review and advice on scheduling.

## 2020-02-22 NOTE — Progress Notes (Signed)
Inpatient Rehabilitation Medication Review by a Pharmacist  A complete drug regimen review was completed for this patient to identify any potential clinically significant medication issues.  Clinically significant medication issues were identified:  no  Check AMION for pharmacist assigned to patient if future medication questions/issues arise during this admission.   Time spent performing this drug regimen review (minutes):  5   Nicole Cella,  Clinical Pharmacist Please check AMION for all [facility] phone numbers After 10:00 PM, call [place] 02/22/2020 4:21 PM

## 2020-02-22 NOTE — Progress Notes (Signed)
STROKE TEAM PROGRESS NOTE   INTERVAL HISTORY No acute event overnight.  Cardiology consulted.  Patient will be admitted to CIR today.  Vitals:   02/21/20 2341 02/22/20 0004 02/22/20 0411 02/22/20 0806  BP: (!) 116/101 130/77 (!) 152/86 (!) 138/93  Pulse: 94 72 88 (!) 51  Resp: 18 17 17 18   Temp: 98.4 F (36.9 C) (!) 97.4 F (36.3 C) (!) 97.3 F (36.3 C) 97.8 F (36.6 C)  TempSrc: Oral Oral Oral Oral  SpO2: 93% 97% 91% 95%  Weight:       CBC:  Recent Labs  Lab 02/20/20 1243 02/20/20 1305  WBC 6.4  --   NEUTROABS 4.2  --   HGB 15.9 16.3  HCT 47.5 48.0  MCV 94.8  --   PLT 247  --    Basic Metabolic Panel:  Recent Labs  Lab 02/20/20 1243 02/20/20 1305  NA 137 141  K 3.8 3.9  CL 103 103  CO2 26  --   GLUCOSE 104* 102*  BUN 22 23  CREATININE 0.92 1.00  CALCIUM 8.9  --    Lipid Panel:  Recent Labs  Lab 02/21/20 0253  CHOL 204*  TRIG 199*  HDL 42  CHOLHDL 4.9  VLDL 40  LDLCALC 122*   HgbA1c:  Recent Labs  Lab 02/21/20 0253  HGBA1C 5.6   Urine Drug Screen:  Recent Labs  Lab 02/20/20 2020  LABOPIA POSITIVE*  COCAINSCRNUR NONE DETECTED  LABBENZ NONE DETECTED  AMPHETMU NONE DETECTED  THCU NONE DETECTED  LABBARB NONE DETECTED    Alcohol Level  Recent Labs  Lab 02/20/20 1243  ETH <10    IMAGING past 24 hours ECHOCARDIOGRAM COMPLETE  Result Date: 02/21/2020    ECHOCARDIOGRAM REPORT   Patient Name:   Ruben Barrera Date of Exam: 02/21/2020 Medical Rec #:  [id number]      Height:       67.0 in Accession #:    [id number]     Weight:       191.2 lb Date of Birth:  September 13, 1932       BSA:          1.984 m Patient Age:    86 years       BP:           154/104 mmHg Patient Gender: M              HR:           97 bpm. Exam Location:  Inpatient Procedure: 2D Echo, Cardiac Doppler and Color Doppler Indications:    Stroke  History:        Patient has no prior history of Echocardiogram examinations.                 Stroke, Signs/Symptoms:Dyspnea; Risk  Factors:Hypertension.  Sonographer:    Roseanna Rainbow RDCS Referring Phys: [place]  Sonographer Comments: Technically difficult study due to poor echo windows. Patient did not have IV access, and refused to get one. Definity not used. IMPRESSIONS  1. Left ventricular ejection fraction, by estimation, is 30 to 35%. The left ventricle has moderately decreased function. The left ventricle demonstrates global hypokinesis. The left ventricular internal cavity size was mildly dilated. There is severe concentric left ventricular hypertrophy. Left ventricular diastolic parameters are indeterminate.  2. Right ventricular systolic function is normal. The right ventricular size is normal.  3. Left atrial size was severely dilated.  4. The mitral valve is grossly normal. Mild  mitral valve regurgitation.  5. The aortic valve is calcified. Aortic valve regurgitation is not visualized. Mild to moderate aortic valve stenosis. Aortic valve mean gradient measures 25.0 mmHg. Comparison(s): No prior Echocardiogram. Conclusion(s)/Recommendation(s): LV function is reduced. There is at least mild to moderate aortic stenosis; this may be underestimated in the setting of LV function. FINDINGS  Left Ventricle: LVMI 197 g/m2 RWT 0.54. Left ventricular ejection fraction, by estimation, is 30 to 35%. The left ventricle has moderately decreased function. The left ventricle demonstrates global hypokinesis. The left ventricular internal cavity size was mildly dilated. There is severe concentric left ventricular hypertrophy. Left ventricular diastolic parameters are indeterminate. Right Ventricle: The right ventricular size is normal. No increase in right ventricular wall thickness. Right ventricular systolic function is normal. Left Atrium: Left atrial size was severely dilated. Right Atrium: Right atrial size was normal in size. Pericardium: There is no evidence of pericardial effusion. Mitral Valve: The mitral valve is grossly normal.  There is mild thickening of the mitral valve leaflet(s). Mild to moderate mitral annular calcification. Mild mitral valve regurgitation. Tricuspid Valve: The tricuspid valve is grossly normal. Tricuspid valve regurgitation is not demonstrated. Aortic Valve: LV SVI 38. The aortic valve is calcified. Aortic valve regurgitation is not visualized. Mild to moderate aortic stenosis is present. Aortic valve mean gradient measures 25.0 mmHg. Aortic valve peak gradient measures 39.1 mmHg. Aortic valve area, by VTI measures 1.32 cm. Pulmonic Valve: The pulmonic valve was grossly normal. Pulmonic valve regurgitation is not visualized. Aorta: The aortic root is normal in size and structure. Venous: The pulmonary veins were not well visualized. The inferior vena cava was not well visualized. IAS/Shunts: The atrial septum is grossly normal.  LEFT VENTRICLE PLAX 2D LVIDd:         5.90 cm      Diastology LVIDs:         5.30 cm      LV e' medial:    3.99 cm/s LV PW:         1.60 cm      LV E/e' medial:  20.7 LV IVS:        1.30 cm      LV e' lateral:   4.12 cm/s LVOT diam:     2.45 cm      LV E/e' lateral: 20.0 LV SV:         80 LV SV Index:   40 LVOT Area:     4.71 cm  LV Volumes (MOD) LV vol d, MOD A2C: 165.0 ml LV vol d, MOD A4C: 119.5 ml LV vol s, MOD A2C: 111.0 ml LV vol s, MOD A4C: 76.0 ml LV SV MOD A2C:     54.0 ml LV SV MOD A4C:     119.5 ml LV SV MOD BP:      48.8 ml RIGHT VENTRICLE             IVC RV S prime:     18.00 cm/s  IVC diam: 3.10 cm TAPSE (M-mode): 2.3 cm LEFT ATRIUM             Index       RIGHT ATRIUM           Index LA diam:        4.20 cm 2.12 cm/m  RA Area:     12.20 cm LA Vol (A2C):   70.5 ml 35.54 ml/m RA Volume:   24.90 ml  12.55 ml/m LA Vol (A4C):   94.0  ml 47.38 ml/m LA Biplane Vol: 85.4 ml 43.05 ml/m  AORTIC VALVE AV Area (Vmax):    1.22 cm AV Area (Vmean):   1.08 cm AV Area (VTI):     1.32 cm AV Vmax:           312.75 cm/s AV Vmean:          236.500 cm/s AV VTI:            0.608 m AV Peak  Grad:      39.1 mmHg AV Mean Grad:      25.0 mmHg LVOT Vmax:         80.90 cm/s LVOT Vmean:        54.400 cm/s LVOT VTI:          0.170 m LVOT/AV VTI ratio: 0.28  AORTA Ao Root diam: 3.20 cm Ao Asc diam:  3.10 cm MITRAL VALVE MV Area (PHT): 4.97 cm    SHUNTS MV Decel Time: 153 msec    Systemic VTI:  0.17 m MV E velocity: 82.53 cm/s  Systemic Diam: 2.45 cm Rudean Haskell MD Electronically signed by Rudean Haskell MD Signature Date/Time: 02/21/2020/4:33:50 PM    Final     PHYSICAL EXAM    Temp:  [97.2 F (36.2 C)-98.5 F (36.9 C)] 97.8 F (36.6 C) (12/02 0806) Pulse Rate:  [51-111] 51 (12/02 0806) Resp:  [17-20] 18 (12/02 0806) BP: (116-152)/(77-101) 138/93 (12/02 0806) SpO2:  [91 %-97 %] 95 % (12/02 0806) Weight:  [84 kg] 84 kg (12/01 1526)  General - Well nourished, well developed, in no apparent distress.  Ophthalmologic - fundi not visualized due to noncooperation.  Cardiovascular - Regular rhythm and rate.  Mental Status -  Level of arousal and orientation to time, place, and person were intact. Language including expression, naming, repetition, comprehension was assessed and found intact. Fund of Knowledge was assessed and was intact.  Cranial Nerves II - XII - II - Visual field intact OU. III, IV, VI - Extraocular movements intact. V - Facial sensation intact bilaterally. VII - Facial movement intact bilaterally. VIII - Hearing & vestibular intact bilaterally. No nystagmus X - Palate elevates symmetrically. XI - Chin turning & shoulder shrug intact bilaterally. XII - Tongue protrusion intact.  Motor Strength - The patient's strength was normal in all extremities and pronator drift was absent.  Bulk was normal and fasciculations were absent.   Motor Tone - Muscle tone was assessed at the neck and appendages and was normal.  Reflexes - The patient's reflexes were symmetrical in all extremities and he had no pathological reflexes.  Sensory - Light touch,  temperature/pinprick were assessed and were symmetrical.    Coordination - The patient had gross normal movements in the left hand with no ataxia or dysmetria. However, right FTN dysmetria and bilateral HTS ataxic. Tremor was absent.  Gait and Station - deferred.   ASSESSMENT/PLAN Ruben Barrera is a 86 y.o. male with history of hypertension, hyperlipidemia, cigar smoking (1 / day), and lumbar stenosis presenting with recurrent transient double vision, trouble writing his name.   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 Small vessel disease. Atrophy.   MRI w/o Possible subacute R cerebellar peduncle rounded infarct Small vessel disease. Atrophy.   MRI  W/ no abnormal enhancement  R cerebellar peduncle rounded infarct. Punctate enhancement posterior R cerebellar hemispheres felt to be vascular. F/u in 6-12 wks  CTA head & neck no ELVO. L ICA origin 70% stenosis. High-grade R P2 and L P3 stenoses.  2D Echo EF 30-35%, global HK  LDL 122  HgbA1c 5.6  VTE prophylaxis - SCDs   No antithrombotic prior to admission, now on aspirin 81 mg daily and clopidogrel 75 mg daily following load. Continue DAPT x 3 weeks then aspirin alone.    Therapy recommendations:  CIR  Disposition:  CIR, follow up with Dr. Tomi Likens at [facility]   Cardiomyopathy  TTE showed EF 30 to 35%  Recommend cardiology consult for ischemic work-up  Recommend outpatient cardiac monitoring to rule out A. fib  Carotid stenosis  Left ICA origin 70% stenosis  Asymptomatic at this time  Recommend vascular surgery outpatient follow-up  Chronic lower back pain Sciatica  Follow with neurosurgery group  Plan for spinal stimulator to be implanted later this month  Recommend to avoid low BP perioperatively given carotid stenosis  Hypertension  Stable . Long-term BP goal normotensive  Hyperlipidemia  Home meds:  zetia 10,  resumed in hospital  Intolerant to simvastatin  LDL 122, goal < 70  Added pravastatin low-dose 20 (no moderate intensity statin given hx statin intolerance)  Tobacco abuse  Current smoker  Smoking cessation counseling provided  Pt is willing to quit  Other Stroke Risk Factors  Advanced Age >/= 65   ETOH use, alcohol level <10, advised to drink no more than 2 drink(s) a day  Family hx stroke (mother )  Other Active Problems  Erythrocytosis  BPH  Hospital day # 2  Neurology will sign off. Please call with questions. Pt will follow up with Dr. Tomi Likens at [facility] in about 4 weeks. Thanks for the consult.  Rosalin Hawking, MD PhD Stroke Neurology 02/22/2020 12:15 PM    To contact Stroke Continuity provider, please refer to [email or web address] After hours, contact General Neurology

## 2020-02-23 ENCOUNTER — Inpatient Hospital Stay (HOSPITAL_COMMUNITY): Payer: PPO | Admitting: Occupational Therapy

## 2020-02-23 ENCOUNTER — Inpatient Hospital Stay (HOSPITAL_COMMUNITY): Payer: PPO

## 2020-02-23 DIAGNOSIS — I10 Essential (primary) hypertension: Secondary | ICD-10-CM

## 2020-02-23 DIAGNOSIS — R278 Other lack of coordination: Secondary | ICD-10-CM

## 2020-02-23 LAB — COMPREHENSIVE METABOLIC PANEL
ALT: 22 U/L (ref 0–44)
AST: 29 U/L (ref 15–41)
Albumin: 3.8 g/dL (ref 3.5–5.0)
Alkaline Phosphatase: 67 U/L (ref 38–126)
Anion gap: 11 (ref 5–15)
BUN: 22 mg/dL (ref 8–23)
CO2: 26 mmol/L (ref 22–32)
Calcium: 8.9 mg/dL (ref 8.9–10.3)
Chloride: 101 mmol/L (ref 98–111)
Creatinine, Ser: 1.17 mg/dL (ref 0.61–1.24)
GFR, Estimated: >60 mL/min (ref >60)
Glucose, Bld: 111 mg/dL — ABNORMAL HIGH (ref 70–99)
Potassium: 3.7 mmol/L (ref 3.5–5.1)
Sodium: 138 mmol/L (ref 135–145)
Total Bilirubin: 0.9 mg/dL (ref 0.3–1.2)
Total Protein: 6.3 g/dL — ABNORMAL LOW (ref 6.5–8.1)

## 2020-02-23 LAB — CBC WITH DIFFERENTIAL/PLATELET
Abs Immature Granulocytes: 0.02 10*3/uL (ref 0.00–0.07)
Basophils Absolute: 0.1 10*3/uL (ref 0.0–0.1)
Basophils Relative: 1 %
Eosinophils Absolute: 0.4 10*3/uL (ref 0.0–0.5)
Eosinophils Relative: 5 %
HCT: 47.4 % (ref 39.0–52.0)
Hemoglobin: 16.5 g/dL (ref 13.0–17.0)
Immature Granulocytes: 0 %
Lymphocytes Relative: 25 %
Lymphs Abs: 2 10*3/uL (ref 0.7–4.0)
MCH: 32 pg (ref 26.0–34.0)
MCHC: 34.8 g/dL (ref 30.0–36.0)
MCV: 91.9 fL (ref 80.0–100.0)
Monocytes Absolute: 0.9 10*3/uL (ref 0.1–1.0)
Monocytes Relative: 12 %
Neutro Abs: 4.5 10*3/uL (ref 1.7–7.7)
Neutrophils Relative %: 57 %
Platelets: 259 10*3/uL (ref 150–400)
RBC: 5.16 MIL/uL (ref 4.22–5.81)
RDW: 12.6 % (ref 11.5–15.5)
WBC: 7.8 10*3/uL (ref 4.0–10.5)
nRBC: 0 % (ref 0.0–0.2)

## 2020-02-23 LAB — TSH: TSH: 0.757 u[IU]/mL (ref 0.350–4.500)

## 2020-02-23 LAB — MAGNESIUM: Magnesium: 2.2 mg/dL (ref 1.7–2.4)

## 2020-02-23 NOTE — Progress Notes (Signed)
Occupational Therapy Assessment and Plan  Patient Details  Name: Ruben Barrera MRN: [id number] Date of Birth: 1933/03/23  OT Diagnosis: muscle weakness (generalized) Rehab Potential: Rehab Potential (ACUTE ONLY): Excellent ELOS: 1 week   Today's Date: 02/23/2020 OT Individual Time: 1000-1100 OT Individual Time Calculation (min): 60 min     Hospital Problem: Principal Problem:   Acute ischemic cerebrovascular accident (CVA) involving anterior cerebral artery territory [facility])   Past Medical History:  Past Medical History:  Diagnosis Date  . Arthritis   . Complication of anesthesia    "had the shivers" after surgery  . Depression   . Dysrhythmia    "extra heart beat"  . GERD (gastroesophageal reflux disease)   . History of hiatal hernia   . Hypercholesteremia   . Hypertension   . Lumbar spinal stenosis   . RLS (restless legs syndrome)    takes ativan as needed   Past Surgical History:  Past Surgical History:  Procedure Laterality Date  . BACK SURGERY    . LUMBAR LAMINECTOMY/DECOMPRESSION MICRODISCECTOMY N/A 08/28/2015   Procedure: Lumbar Four-Five decompressive lumbar laminectomy;  Surgeon: Jovita Gamma, MD;  Location: [place] [facility];  Service: Neurosurgery;  Laterality: N/A;  . REPLACEMENT TOTAL KNEE BILATERAL Bilateral 11/05/2003  . TOE AMPUTATION Right 2010   second toe  . TONSILLECTOMY      Assessment & Plan Clinical Impression: Patient is a 86 y.o. year old male with recent admission to the hospital on with history of hypertension, chronic back pain with history of lumbar decompression maintained on Celebrex as well as Neurontin with baclofen and hydrocodone as needed, hyperlipidemia and tobacco abuse.  Per chart review patient lives with spouse 1 level home 3 steps to entry.  Independent with assistive device and still driving.  Presented 02/20/2020 with dysarthria and left-sided weakness.  CT of the head showed mild diffuse cortical atrophy.  Low density noted in the  region of the right cerebellar peduncle concerning for infarction of indeterminate age.  Patient did not receive TPA.  MRI showed area of abnormal signal within the right middle cerebellar peduncle.  CT angiogram of head and neck no emergent findings no evidence of cerebellar branch occlusion.  At least 70% left ICA origin stenosis.  Echocardiogram with ejection fraction of 30 to 35%.  Noted severe concentric left ventricular hypertrophy.  Currently maintained on aspirin and Plavix for CVA prophylaxis x3 weeks then aspirin alone.  Tolerating a regular diet.  Therapy evaluations completed and patient was admitted for a comprehensive rehab program. Patient transferred to CIR on 02/22/2020 .    Patient currently requires min with basic self-care skills secondary to muscle weakness, decreased cardiorespiratoy endurance, decreased midline orientation, decreased safety awareness and decreased memory and decreased standing balance, decreased postural control and decreased balance strategies.  Prior to hospitalization, patient could complete BADL with independent .  Patient will benefit from skilled intervention to increase independence with basic self-care skills prior to discharge home with care partner.  Anticipate patient will require 24 hour supervision and follow up home health.  OT - End of Session Endurance Deficit: Yes Endurance Deficit Description: rest breaks within BADL tasks OT Assessment Rehab Potential (ACUTE ONLY): Excellent OT Patient demonstrates impairments in the following area(s): Balance;Behavior;Endurance;Cognition;Motor;Perception;Pain;Safety;Sensory OT Basic ADL's Functional Problem(s): Grooming;Toileting;Dressing;Bathing OT Transfers Functional Problem(s): Toilet;Tub/Shower OT Additional Impairment(s): Fuctional Use of Upper Extremity OT Plan OT Intensity: Minimum of 1-2 x/day, 45 to 90 minutes OT Frequency: 5 out of 7 days OT Duration/Estimated Length of Stay: 1 week  OT  Treatment/Interventions: Balance/vestibular training;Cognitive remediation/compensation;Community reintegration;Discharge planning;Disease mangement/prevention;DME/adaptive equipment instruction;Functional electrical stimulation;Functional mobility training;Neuromuscular re-education;Patient/family education;Pain management;Psychosocial support;Self Care/advanced ADL retraining;Skin care/wound managment;Splinting/orthotics;Therapeutic Exercise;Therapeutic Activities;UE/LE Strength taining/ROM;UE/LE Coordination activities;Wheelchair propulsion/positioning OT Self Feeding Anticipated Outcome(s): n/a OT Basic Self-Care Anticipated Outcome(s): Supervision OT Toileting Anticipated Outcome(s): Supervision OT Bathroom Transfers Anticipated Outcome(s): Supervision OT Recommendation Patient destination: Home Follow Up Recommendations: Home health OT Equipment Recommended: To be determined   OT Evaluation Precautions/Restrictions  Precautions Precautions: Fall Restrictions Weight Bearing Restrictions: No Pain Pain Assessment Pain Scale: 0-10 Pain Score: 3  Pain Intervention(s): Nursing administered pain meds Home Living/Prior Functioning Home Living Family/patient expects to be discharged to:: Private residence Living Arrangements: Spouse/significant other Available Help at Discharge: Available 24 hours/day Type of Home: House Home Access: Stairs to enter Technical brewer of Steps: 3 Entrance Stairs-Rails: Right Home Layout: One level Bathroom Shower/Tub: Gaffer, Charity fundraiser: Standard Bathroom Accessibility: Yes Additional Comments: Has grab bars and a shower  seat  Lives With: Spouse IADL History Current License: Yes Occupation: Retired IADL Comments: Patient used to really enjoy golfing, used to be a Musician. He also was an Programme researcher, broadcasting/film/video for [employer] Enjoys working out in his home gym Prior Function Level of Independence: Independent  with basic ADLs, Independent with homemaking with ambulation Comments: Independent with all mobility with use of hurrycane in R hand and int 3-wheeled walker in morning. Patient was still driving.  Vision Baseline Vision/History: Wears glasses Wears Glasses: At all times Patient Visual Report: Diplopia (intially, but has since resolved) Vision Assessment?: No apparent visual deficits Cognition Overall Cognitive Status: Impaired/Different from baseline Arousal/Alertness: Awake/alert Orientation Level: Person;Place;Situation Person: Oriented Place: Oriented Situation: Oriented Year: 2021 Month: December Day of Week: Correct Memory: Impaired Decreased Short Term Memory: Verbal basic;Functional basic Immediate Memory Recall: Sock;Blue;Bed Memory Recall Sock: Without Cue Memory Recall Blue: Without Cue Memory Recall Bed: Without Cue Attention: Sustained Sustained Attention: Impaired Sustained Attention Impairment: Verbal basic;Functional basic Awareness: Impaired Awareness Impairment: Anticipatory impairment Problem Solving: Appears intact Executive Function: Organizing;Self Monitoring Organizing: Impaired Self Monitoring Impairment: Verbal basic;Functional basic Behaviors: Impulsive Safety/Judgment: Impaired Comments: Patient with decreased awareness of deficits and poor safety awareness Sensation Sensation Light Touch: Appears Intact Hot/Cold: Appears Intact Coordination Gross Motor Movements are Fluid and Coordinated: No Fine Motor Movements are Fluid and Coordinated: No Coordination and Movement Description: mild decrease in smoothness on L side Finger Nose Finger Test: WFL Motor  Motor Motor: Ataxia  Balance Standardized Balance Assessment Static Sitting Balance Static Sitting - Balance Support: Feet supported Static Sitting - Level of Assistance: 5: Stand by assistance Dynamic Sitting Balance Dynamic Sitting - Balance Support: During functional activity Dynamic  Sitting - Level of Assistance: 5: Stand by assistance Static Standing Balance Static Standing - Balance Support: During functional activity Static Standing - Level of Assistance: 4: Min assist Dynamic Standing Balance Dynamic Standing - Balance Support: During functional activity Dynamic Standing - Level of Assistance: 4: Min assist Extremity/Trunk Assessment RUE Assessment RUE Assessment: Within Functional Limits LUE Assessment LUE Assessment: Within Functional Limits  Care Tool Care Tool Self Care Eating   Eating Assist Level: Set up assist    Oral Care    Oral Care Assist Level: Supervision/Verbal cueing    Bathing   Body parts bathed by patient: Right arm;Left arm;Chest;Abdomen;Front perineal area;Buttocks;Left upper leg;Right upper leg;Right lower leg;Left lower leg;Face     Assist Level: Minimal Assistance - Patient > 75%    Upper Body Dressing(including orthotics)   What is  the patient wearing?: Pull over shirt   Assist Level: Contact Guard/Touching assist    Lower Body Dressing (excluding footwear)   What is the patient wearing?: Pants;Underwear/pull up Assist for lower body dressing: Minimal Assistance - Patient > 75%    Putting on/Taking off footwear   What is the patient wearing?: Socks;Shoes Assist for footwear: Minimal Assistance - Patient > 75%       Care Tool Toileting Toileting activity   Assist for toileting: Contact Guard/Touching assist      Care Tool Transfers Sit to stand transfer   Sit to stand assist level: Contact Guard/Touching assist    Chair/bed transfer   Chair/bed transfer assist level: Minimal Assistance - Patient > 75%     Toilet transfer   Assist Level: Minimal Assistance - Patient > 75%     Care Tool Cognition Expression of Ideas and Wants Expression of Ideas and Wants: Without difficulty (complex and basic) - expresses complex messages without difficulty and with speech that is clear and easy to understand   Understanding  Verbal and Non-Verbal Content Understanding Verbal and Non-Verbal Content: Understands (complex and basic) - clear comprehension without cues or repetitions   Memory/Recall Ability *first 3 days only Memory/Recall Ability *first 3 days only: Location of own room;Current season;Staff names and faces;That he or she is in a hospital/hospital unit    Refer to Care Plan for [place] 1 OT Short Term Goal 1 (Week 1): LTG=STG 2/2 ELOS  Recommendations for other services: None    Skilled Therapeutic Intervention Patient greeted seated in recliner and agreeable to OT eval and treat. Patient agreeable to shower today. Pt ambulated with RW throughout session with overall CGA and intermittent Min A for occasional LOB. Pt with decreased safety awareness and decreased overall awareness of deficits. Pt needed CGA/min A for BADL tasks at shower level. OT addressed rehab process, OT purpose, POC, ELOS, and goals. Patient left seated in recliner with chiar alarm pad on, call bell in reach, and needs met.   ADL ADL Eating: Set up Grooming: Supervision/safety Upper Body Bathing: Contact guard Lower Body Bathing: Minimal assistance Upper Body Dressing: Contact guard Lower Body Dressing: Minimal assistance Toileting: Minimal assistance Toilet Transfer: Minimal assistance Walk-In Shower Transfer: Minimal assistance   Discharge Criteria: Patient will be discharged from OT if patient refuses treatment 3 consecutive times without medical reason, if treatment goals not met, if there is a change in medical status, if patient makes no progress towards goals or if patient is discharged from hospital.  The above assessment, treatment plan, treatment alternatives and goals were discussed and mutually agreed upon: by patient  Valma Cava 02/23/2020, 12:56 PM

## 2020-02-23 NOTE — Plan of Care (Signed)
  Problem: RH Balance Goal: LTG: Patient will maintain dynamic sitting balance (OT) Description: LTG:  Patient will maintain dynamic sitting balance with assistance during activities of daily living (OT) Flowsheets (Taken 02/23/2020 1250) LTG: Pt will maintain dynamic sitting balance during ADLs with: Independent Goal: LTG Patient will maintain dynamic standing with ADLs (OT) Description: LTG:  Patient will maintain dynamic standing balance with assist during activities of daily living (OT)  Flowsheets (Taken 02/23/2020 1250) LTG: Pt will maintain dynamic standing balance during ADLs with: Supervision/Verbal cueing   Problem: Sit to Stand Goal: LTG:  Patient will perform sit to stand in prep for activites of daily living with assistance level (OT) Description: LTG:  Patient will perform sit to stand in prep for activites of daily living with assistance level (OT) Flowsheets (Taken 02/23/2020 1250) LTG: PT will perform sit to stand in prep for activites of daily living with assistance level: Supervision/Verbal cueing   Problem: RH Grooming Goal: LTG Patient will perform grooming w/assist,cues/equip (OT) Description: LTG: Patient will perform grooming with assist, with/without cues using equipment (OT) Flowsheets (Taken 02/23/2020 1250) LTG: Pt will perform grooming with assistance level of: Independent   Problem: RH Bathing Goal: LTG Patient will bathe all body parts with assist levels (OT) Description: LTG: Patient will bathe all body parts with assist levels (OT) Flowsheets (Taken 02/23/2020 1250) LTG: Pt will perform bathing with assistance level/cueing: Supervision/Verbal cueing   Problem: RH Dressing Goal: LTG Patient will perform upper body dressing (OT) Description: LTG Patient will perform upper body dressing with assist, with/without cues (OT). Flowsheets (Taken 02/23/2020 1250) LTG: Pt will perform upper body dressing with assistance level of: Independent with assistive  device Goal: LTG Patient will perform lower body dressing w/assist (OT) Description: LTG: Patient will perform lower body dressing with assist, with/without cues in positioning using equipment (OT) Flowsheets (Taken 02/23/2020 1250) LTG: Pt will perform lower body dressing with assistance level of: Supervision/Verbal cueing   Problem: RH Toileting Goal: LTG Patient will perform toileting task (3/3 steps) with assistance level (OT) Description: LTG: Patient will perform toileting task (3/3 steps) with assistance level (OT)  Flowsheets (Taken 02/23/2020 1250) LTG: Pt will perform toileting task (3/3 steps) with assistance level: Supervision/Verbal cueing   Problem: RH Functional Use of Upper Extremity Goal: LTG Patient will use RT/LT upper extremity as a (OT) Description: LTG: Patient will use right/left upper extremity as a stabilizer/gross assist/diminished/nondominant/dominant level with assist, with/without cues during functional activity (OT) Flowsheets (Taken 02/23/2020 1250) LTG: Use of upper extremity in functional activities: LUE as nondominant level   Problem: RH Toilet Transfers Goal: LTG Patient will perform toilet transfers w/assist (OT) Description: LTG: Patient will perform toilet transfers with assist, with/without cues using equipment (OT) Flowsheets (Taken 02/23/2020 1250) LTG: Pt will perform toilet transfers with assistance level of: Supervision/Verbal cueing   Problem: RH Tub/Shower Transfers Goal: LTG Patient will perform tub/shower transfers w/assist (OT) Description: LTG: Patient will perform tub/shower transfers with assist, with/without cues using equipment (OT) Flowsheets (Taken 02/23/2020 1250) LTG: Pt will perform tub/shower stall transfers with assistance level of: Supervision/Verbal cueing

## 2020-02-23 NOTE — Progress Notes (Signed)
[place] PHYSICAL MEDICINE & REHABILITATION PROGRESS NOTE   Subjective/Complaints: Had a pretty good night. Up with PT early. No new complaints. Wants a "short stay"  ROS: Patient denies fever, rash, sore throat, blurred vision, nausea, vomiting, diarrhea, cough, shortness of breath or chest pain, joint or back pain, headache, or mood change.    Objective:   ECHOCARDIOGRAM COMPLETE  Result Date: 02/21/2020    ECHOCARDIOGRAM REPORT   Patient Name:   Ruben Barrera Date of Exam: 02/21/2020 Medical Rec #:  [id number]      Height:       67.0 in Accession #:    [id number]     Weight:       191.2 lb Date of Birth:  [birth year]       BSA:          1.984 m Patient Age:    86 years       BP:           154/104 mmHg Patient Gender: M              HR:           97 bpm. Exam Location:  Inpatient Procedure: 2D Echo, Cardiac Doppler and Color Doppler Indications:    Stroke  History:        Patient has no prior history of Echocardiogram examinations.                 Stroke, Signs/Symptoms:Dyspnea; Risk Factors:Hypertension.  Sonographer:    Roseanna Rainbow RDCS Referring Phys: [place]  Sonographer Comments: Technically difficult study due to poor echo windows. Patient did not have IV access, and refused to get one. Definity not used. IMPRESSIONS  1. Left ventricular ejection fraction, by estimation, is 30 to 35%. The left ventricle has moderately decreased function. The left ventricle demonstrates global hypokinesis. The left ventricular internal cavity size was mildly dilated. There is severe concentric left ventricular hypertrophy. Left ventricular diastolic parameters are indeterminate.  2. Right ventricular systolic function is normal. The right ventricular size is normal.  3. Left atrial size was severely dilated.  4. The mitral valve is grossly normal. Mild mitral valve regurgitation.  5. The aortic valve is calcified. Aortic valve regurgitation is not visualized. Mild to moderate aortic valve stenosis.  Aortic valve mean gradient measures 25.0 mmHg. Comparison(s): No prior Echocardiogram. Conclusion(s)/Recommendation(s): LV function is reduced. There is at least mild to moderate aortic stenosis; this may be underestimated in the setting of LV function. FINDINGS  Left Ventricle: LVMI 197 g/m2 RWT 0.54. Left ventricular ejection fraction, by estimation, is 30 to 35%. The left ventricle has moderately decreased function. The left ventricle demonstrates global hypokinesis. The left ventricular internal cavity size was mildly dilated. There is severe concentric left ventricular hypertrophy. Left ventricular diastolic parameters are indeterminate. Right Ventricle: The right ventricular size is normal. No increase in right ventricular wall thickness. Right ventricular systolic function is normal. Left Atrium: Left atrial size was severely dilated. Right Atrium: Right atrial size was normal in size. Pericardium: There is no evidence of pericardial effusion. Mitral Valve: The mitral valve is grossly normal. There is mild thickening of the mitral valve leaflet(s). Mild to moderate mitral annular calcification. Mild mitral valve regurgitation. Tricuspid Valve: The tricuspid valve is grossly normal. Tricuspid valve regurgitation is not demonstrated. Aortic Valve: LV SVI 38. The aortic valve is calcified. Aortic valve regurgitation is not visualized. Mild to moderate aortic stenosis is present. Aortic valve mean gradient measures 25.0  mmHg. Aortic valve peak gradient measures 39.1 mmHg. Aortic valve area, by VTI measures 1.32 cm. Pulmonic Valve: The pulmonic valve was grossly normal. Pulmonic valve regurgitation is not visualized. Aorta: The aortic root is normal in size and structure. Venous: The pulmonary veins were not well visualized. The inferior vena cava was not well visualized. IAS/Shunts: The atrial septum is grossly normal.  LEFT VENTRICLE PLAX 2D LVIDd:         5.90 cm      Diastology LVIDs:         5.30 cm      LV  e' medial:    3.99 cm/s LV PW:         1.60 cm      LV E/e' medial:  20.7 LV IVS:        1.30 cm      LV e' lateral:   4.12 cm/s LVOT diam:     2.45 cm      LV E/e' lateral: 20.0 LV SV:         80 LV SV Index:   40 LVOT Area:     4.71 cm  LV Volumes (MOD) LV vol d, MOD A2C: 165.0 ml LV vol d, MOD A4C: 119.5 ml LV vol s, MOD A2C: 111.0 ml LV vol s, MOD A4C: 76.0 ml LV SV MOD A2C:     54.0 ml LV SV MOD A4C:     119.5 ml LV SV MOD BP:      48.8 ml RIGHT VENTRICLE             IVC RV S prime:     18.00 cm/s  IVC diam: 3.10 cm TAPSE (M-mode): 2.3 cm LEFT ATRIUM             Index       RIGHT ATRIUM           Index LA diam:        4.20 cm 2.12 cm/m  RA Area:     12.20 cm LA Vol (A2C):   70.5 ml 35.54 ml/m RA Volume:   24.90 ml  12.55 ml/m LA Vol (A4C):   94.0 ml 47.38 ml/m LA Biplane Vol: 85.4 ml 43.05 ml/m  AORTIC VALVE AV Area (Vmax):    1.22 cm AV Area (Vmean):   1.08 cm AV Area (VTI):     1.32 cm AV Vmax:           312.75 cm/s AV Vmean:          236.500 cm/s AV VTI:            0.608 m AV Peak Grad:      39.1 mmHg AV Mean Grad:      25.0 mmHg LVOT Vmax:         80.90 cm/s LVOT Vmean:        54.400 cm/s LVOT VTI:          0.170 m LVOT/AV VTI ratio: 0.28  AORTA Ao Root diam: 3.20 cm Ao Asc diam:  3.10 cm MITRAL VALVE MV Area (PHT): 4.97 cm    SHUNTS MV Decel Time: 153 msec    Systemic VTI:  0.17 m MV E velocity: 82.53 cm/s  Systemic Diam: 2.45 cm Rudean Haskell MD Electronically signed by Rudean Haskell MD Signature Date/Time: 02/21/2020/4:33:50 PM    Final    Recent Labs    02/20/20 1243 02/20/20 1243 02/20/20 1305 02/23/20 0549  WBC 6.4  --   --  7.8  HGB 15.9   < > 16.3 16.5  HCT 47.5   < > 48.0 47.4  PLT 247  --   --  259   < > = values in this interval not displayed.   Recent Labs    02/20/20 1243 02/20/20 1243 02/20/20 1305 02/23/20 0549  NA 137   < > 141 138  K 3.8   < > 3.9 3.7  CL 103   < > 103 101  CO2 26  --   --  26  GLUCOSE 104*   < > 102* 111*  BUN 22   < > 23 22   CREATININE 0.92   < > 1.00 1.17  CALCIUM 8.9  --   --  8.9   < > = values in this interval not displayed.    Intake/Output Summary (Last 24 hours) at 02/23/2020 1134 Last data filed at 02/23/2020 0900 Gross per 24 hour  Intake 420 ml  Output --  Net 420 ml        Physical Exam: Vital Signs Blood pressure 140/88, pulse 91, temperature 97.8 F (36.6 C), temperature source Oral, resp. rate 18, height 5\' 7"  (1.702 m), weight 82.1 kg, SpO2 97 %.  General: Alert and oriented x 3, No apparent distress HEENT: Head is normocephalic, atraumatic, PERRLA, EOMI, sclera anicteric, oral mucosa pink and moist, dentition intact, ext ear canals clear,  Neck: Supple without JVD or lymphadenopathy Heart: Reg rate and rhythm. No murmurs rubs or gallops Chest: CTA bilaterally without wheezes, rales, or rhonchi; no distress Abdomen: Soft, non-tender, non-distended, bowel sounds positive. Extremities: No clubbing, cyanosis, or edema. Pulses are 2+ Skin: Clean and intact without signs of breakdown Neuro: Pt is cognitively appropriate with normal insight, memory, and awareness. Cranial nerves 2-12 are intact. No nystagmus. Minimal right limb ataxia. Definite truncal ataxia with loss of balance to the right. Slightly impulsive.  Sensory exam is normal. Reflexes are 2+ in all 4's. Fine motor coordination is intact. No tremors. Motor function is grossly 5/5.  Musculoskeletal: Full ROM, No pain with AROM or PROM in the neck, trunk, or extremities. Posture appropriate Psych: Pt's affect is appropriate. Pt is cooperative     Assessment/Plan: 1. Functional deficits which require 3+ hours per day of interdisciplinary therapy in a comprehensive inpatient rehab setting.  Physiatrist is providing close team supervision and 24 hour management of active medical problems listed below.  Physiatrist and rehab team continue to assess barriers to discharge/monitor patient progress toward functional and medical  goals  Care Tool:  Bathing    Body parts bathed by patient: Right arm, Left arm, Chest, Abdomen, Front perineal area, Buttocks, Left upper leg, Right upper leg, Right lower leg, Left lower leg, Face         Bathing assist Assist Level: Minimal Assistance - Patient > 75%     Upper Body Dressing/Undressing Upper body dressing   What is the patient wearing?: Pull over shirt    Upper body assist Assist Level: Contact Guard/Touching assist    Lower Body Dressing/Undressing Lower body dressing      What is the patient wearing?: Pants, Underwear/pull up     Lower body assist Assist for lower body dressing: Minimal Assistance - Patient > 75%     Toileting Toileting    Toileting assist Assist for toileting: Contact Guard/Touching assist     Transfers Chair/bed transfer  Transfers assist     Chair/bed transfer assist level: Minimal Assistance - Patient > 75%  Locomotion Ambulation   Ambulation assist              Walk 10 feet activity   Assist           Walk 50 feet activity   Assist           Walk 150 feet activity   Assist           Walk 10 feet on uneven surface  activity   Assist           Wheelchair     Assist               Wheelchair 50 feet with 2 turns activity    Assist            Wheelchair 150 feet activity     Assist          Blood pressure 140/88, pulse 91, temperature 97.8 F (36.6 C), temperature source Oral, resp. rate 18, height 5\' 7"  (1.702 m), weight 82.1 kg, SpO2 97 %.  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  -beginning therapies today 2. Antithrombotics: -DVT/anticoagulation:SCDs -antiplatelet therapy:  Aspirin 81 mg daily and Plavix 75 mg daily x3 weeks then aspirin alone 3. Pain Management/chronic back pain:Neurontin 600 mg 3 times daily, hydrocodone as needed 4. Mood:Ativan 1 mg every 8 hours as needed anxiety provide emotional support -antipsychotic agents: N/A 5. Neuropsych: This patientiscapable of making decisions on hisown behalf. 6. Skin/Wound Care:Routine skin checks 7. Fluids/Electrolytes/Nutrition:fair intake so far  -I personally reviewed the patient's labs today.  wnl 8. Cardiomyopathy. Ejection fraction 30 to 35%. Follow-up cardiology services   Filed Weights   02/22/20 1524 02/22/20 1548  Weight: 82.1 kg 82.1 kg    9. Carotid stenosis. Left ICA origin 70% stenosis. Recommend vascular outpatient follow-up 10. Permissive hypertension. Patient on Norvasc 2.5 mg daily prior to admission. continue to hold for now 11. BPH. Flomax 0.4 mg daily. Voiding well 12. GERD. Protonix 13. Hyperlipidemia. Zetia/Pravachol 14.Tobacco abuse.Counseling 15. Word finding issues- pt specifically asking for SLP to work on this issue.    - I saw no word finding issues this morning  LOS: 1 days A FACE TO FACE EVALUATION WAS PERFORMED  Meredith Staggers 02/23/2020, 11:34 AM

## 2020-02-23 NOTE — Progress Notes (Signed)
Inpatient Rehabilitation  Patient information reviewed and entered into eRehab system by Jiovani Mccammon M. Jasani Dolney, M.A., CCC/SLP, PPS Coordinator.  Information including medical coding, functional ability and quality indicators will be reviewed and updated through discharge.    

## 2020-02-23 NOTE — Progress Notes (Signed)
Patient Details  Name: Ruben Barrera MRN: [id number] Date of Birth: 1932/04/18  Today's Date: 02/23/2020  Hospital Problems: Principal Problem:   Acute ischemic cerebrovascular accident (CVA) involving anterior cerebral artery [facility])  Past Medical History:  Past Medical History:  Diagnosis Date  . Arthritis   . Complication of anesthesia    "had the shivers" after surgery  . Depression   . Dysrhythmia    "extra heart beat"  . GERD (gastroesophageal reflux disease)   . History of hiatal hernia   . Hypercholesteremia   . Hypertension   . Lumbar spinal stenosis   . RLS (restless legs syndrome)    takes ativan as needed   Past Surgical History:  Past Surgical History:  Procedure Laterality Date  . BACK SURGERY    . LUMBAR LAMINECTOMY/DECOMPRESSION MICRODISCECTOMY N/A 08/28/2015   Procedure: Lumbar Four-Five decompressive lumbar laminectomy;  Surgeon: Jovita Gamma, MD;  Location: [place] [facility];  Service: Neurosurgery;  Laterality: N/A;  . REPLACEMENT TOTAL KNEE BILATERAL Bilateral 11/05/2003  . TOE AMPUTATION Right 2010   second toe  . TONSILLECTOMY     Social History:  reports that he has been smoking cigars. He has never used smokeless tobacco. He reports current alcohol use. He reports that he does not use drugs.  Family / Support Systems Marital Status: Married How Long?: 43 years Patient Roles: Spouse, Parent Spouse/Significant Other: [name] (wife): [phone] Children: 2 adult sons; 1 lives in [place], and the other son splits his time between his home in [place], and [place], [place]. Other Supports: None Anticipated Caregiver: Wife Ability/Limitations of Caregiver: wife  to prove 24.7 care Caregiver Availability: 24/7 Family Dynamics: Pt lives with his wife.  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 Pt primarily worked in  Librarian, academic for leadership Education: college grad Read: Yes Write: Yes Employment Status: Retired Date Retired/Disabled/Unemployed: Water quality scientist Issues: Denies Guardian/Conservator: N/A   Abuse/Neglect Abuse/Neglect Assessment Can Be Completed: Yes Physical Abuse: Denies Verbal Abuse: Denies Sexual Abuse: Denies Exploitation of patient/patient's resources: Denies Self-Neglect: Denies  Emotional Status Pt's affect, behavior and adjustment status: Pt in good spirits at time of visit Recent Psychosocial Issues: Denies Psychiatric History: Denies Substance Abuse History: Denies; pt admits that he continues to smoke one cigar daily.  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."  [employer]: None Premorbid Home Care/DME Agencies: None Transportation available at discharge: wife Resource referrals recommended: Neuropsychology  Discharge Planning Living Arrangements: Spouse/significant other Support Systems: Spouse/significant other Type of Residence: Private residence Administrator, sports: Multimedia programmer (specify) ([employer]) Financial Resources: Social Security Financial Screen Referred: No Living Expenses: Own Money Management: Spouse Does the patient have any problems obtaining your medications?: No Home Management: wife manages housekeeping/meal prep needs Patient/Family Preliminary Plans: No changes Care Coordinator Barriers to Discharge: Decreased caregiver support, Lack of/limited family support Care Coordinator Anticipated Follow Up Needs: HH/OP  Clinical Impression SW met with pt in  room to introduce self, explain role, and discuss discharge process. Pt is veteranPublishing copy [phone]. HCPOA is his wife. Pt DME: 2 RWs, shower tool, 3in1BSC, crutches, and small gym with recumbent bike, treadmill,  and weights. Pt aware SW to follow-up with his wife.   SW spoke with pt wife [name] to introduce self, explain role, and discuss discharge process. Wife aware there will be follow-up after team conference on Tuesday.   Auria A Chamberlain 02/23/2020, 1:48 PM

## 2020-02-23 NOTE — Evaluation (Signed)
Physical Therapy Assessment and Plan  Patient Details  Name: Ruben Barrera MRN: [id number] Date of Birth: 11-Aug-1932  PT Diagnosis: Abnormality of gait Rehab Potential: Good ELOS: 5-7 days   Today's Date: 02/23/2020 PT Individual Time: 1415-1520 and 800-900 PT Individual Time Calculation (min): 65 min  And 60 min  Hospital Problem: Principal Problem:   Acute ischemic cerebrovascular accident (CVA) involving anterior cerebral artery territory [facility])   Past Medical History:  Past Medical History:  Diagnosis Date  . Arthritis   . Complication of anesthesia    "had the shivers" after surgery  . Depression   . Dysrhythmia    "extra heart beat"  . GERD (gastroesophageal reflux disease)   . History of hiatal hernia   . Hypercholesteremia   . Hypertension   . Lumbar spinal stenosis   . RLS (restless legs syndrome)    takes ativan as needed   Past Surgical History:  Past Surgical History:  Procedure Laterality Date  . BACK SURGERY    . LUMBAR LAMINECTOMY/DECOMPRESSION MICRODISCECTOMY N/A 08/28/2015   Procedure: Lumbar Four-Five decompressive lumbar laminectomy;  Surgeon: Jovita Gamma, MD;  Location: [facility];  Service: Neurosurgery;  Laterality: N/A;  . REPLACEMENT TOTAL KNEE BILATERAL Bilateral 11/05/2003  . TOE AMPUTATION Right 2010   second toe  . TONSILLECTOMY      Assessment & Plan Clinical Impression: Ruben Barrera is an 86 year old right-handed male with history of hypertension, chronic back painwith history of lumbar decompressionmaintained on Celebrex as well as Neurontin with baclofen and hydrocodone as needed, hyperlipidemia and tobacco abuse. Per chart review patient lives with spouse 1 level home 3 steps to entry. Independent with assistive device and still driving. Presented 02/20/2020 with dysarthria and left-sided weakness. CT of the head showed mild diffuse cortical atrophy. Low density noted in the region of the right cerebellar peduncle concerning  for infarction of indeterminate age. Patient did not receive TPA. MRI showed area of abnormal signal within the right middle cerebellar peduncle. CT angiogram of head and neck no emergent findings no evidence of cerebellar branch occlusion. At least 70% left ICA origin stenosis. Echocardiogram with ejection fraction of 30 to 35%. Noted severe concentric left ventricular hypertrophy. Currently maintained on aspirin and Plavix for CVA prophylaxis x3 weeks then aspirin alone. Tolerating a regular diet.   Patient transferred to CIR on 02/22/2020 .   Patient currently requires min with mobility secondary to muscle weakness, impaired timing and sequencing, unbalanced muscle activation and decreased coordination and decreased standing balance, decreased postural control and decreased balance strategies.  Prior to hospitalization, patient was modified independent  with mobility and lived with Spouse in a House home.  Home access is 3Stairs to enter.  Pt scored 36/56 on BERG balance assessment indicating need for full time use of walker with mobility. He reports > 9 falls in past year reinforcing this finding. He would benefit from followup OPT for balance training/fall prevention when dc from IPR.  He will also need RW at DC   Patient will benefit from skilled PT intervention to maximize safe functional mobility, minimize fall risk and decrease caregiver burden for planned discharge home with 24 hour assist.  Anticipate patient will benefit from follow up OP at discharge.  PT - End of Session Activity Tolerance: Tolerates 30+ min activity with multiple rests Endurance Deficit: Yes Endurance Deficit Description: rest breaks w/balance training, very active PTA PT Assessment Rehab Potential (ACUTE/IP ONLY): Good PT Barriers to Discharge: Other (comments) PT Barriers to Discharge  Comments: balance and very strong hx of recent falls PT Patient demonstrates impairments in the following area(s): Balance PT  Transfers Functional Problem(s): Bed Mobility;Bed to Chair;Car;Furniture PT Locomotion Functional Problem(s): Ambulation;Stairs PT Plan PT Intensity: Minimum of 1-2 x/day ,45 to 90 minutes PT Frequency: 5 out of 7 days PT Duration Estimated Length of Stay: 5-7 days PT Treatment/Interventions: Ambulation/gait training;Neuromuscular re-education;Stair training;UE/LE Strength taining/ROM;Balance/vestibular training;Discharge planning;Pain management;Therapeutic Activities;UE/LE Coordination activities;Functional mobility training;Patient/family education;Therapeutic Exercise;Disease management/prevention;DME/adaptive equipment instruction PT Transfers Anticipated Outcome(s): mod i PT Locomotion Anticipated Outcome(s): mod I PT Recommendation Follow Up Recommendations: Home health PT Patient destination: Home Equipment Recommended: Rolling walker with 5" wheels   PT Evaluation Precautions/Restrictions Precautions Precautions: Fall Restrictions Weight Bearing Restrictions: No Pain    Home Living/Prior Functioning Home Living Available Help at Discharge: Available 24 hours/day Type of Home: House Home Access: Stairs to enter [employer] of Steps: 3 Entrance Stairs-Rails: Right;Left Home Layout: One level Bathroom Shower/Tub: Walk-in shower;Door [employer] Toilet: Standard Bathroom Accessibility: Yes Additional Comments: Has grab bars and a shower  seat  Lives With: Spouse Prior Function Level of Independence: Independent with basic ADLs;Needs assistance with gait (use cane on occasion, at least 9 falls in past year)  Able to Take Stairs?: Yes Vocation: Retired Comments: states he used cane, walker only to get to converted garage gym Vision/Perception   wears glasses  Cognition Overall Cognitive Status: Impaired/Different from baseline Arousal/Alertness: Awake/alert Orientation Level: Oriented X4 Attention: Sustained Sustained Attention: Impaired Sensation  Sensation Light Touch: Appears Intact Proprioception: Appears Intact Motor  Motor Motor: Ataxia Motor - Skilled Clinical Observations: RLE   Trunk/Postural Assessment  Cervical Assessment Cervical Assessment: Within Functional Limits Thoracic Assessment Thoracic Assessment: Within Functional Limits Lumbar Assessment Lumbar Assessment: Within Functional Limits Postural Control Postural Control: Deficits on evaluation Righting Reactions: delayed Protective Responses: delayed  Balance   Patient demonstrates increased fall risk as noted by score of  36 /56 on Berg Balance Scale.  (<36= high risk for falls, close to 100%; 37-45 significant >80%; 46-51 moderate >50%; 52-55 lower >25%) See flowchart for details of BERG test  Extremity Assessment  RUE Assessment RUE Assessment: Within Functional Limits   RLE Assessment General Strength Comments: proximal weakness noted trendelenberg w/gait, resistive testing of hip limited by chronic back pain, R DF 4/5 LLE Assessment LLE Assessment: Within Functional Limits General Strength Comments: limited resistance due to chronic back pain  Care Tool Care Tool Bed Mobility Roll left and right activity   Roll left and right assist level: Independent    Sit to lying activity   Sit to lying assist level: Supervision/Verbal cueing    Lying to sitting edge of bed activity   Lying to sitting edge of bed assist level: Supervision/Verbal cueing     Care Tool Transfers Sit to stand transfer   Sit to stand assist level: Contact Guard/Touching assist    Chair/bed transfer   Chair/bed transfer assist level: Minimal Assistance - Patient > 75%     Physiological scientist transfer assist level: Minimal Assistance - Patient > 75%      Care Tool Locomotion Ambulation   Assist level: Minimal Assistance - Patient > 75% Assistive device: Cane-straight Max distance: 150  Walk 10 feet activity   Assist level: Minimal  Assistance - Patient > 75% Assistive device: Walker-rolling   Walk 50 feet with 2 turns activity   Assist level: Minimal Assistance - Patient > 75% Assistive device: Cane-straight  Walk 150 feet activity   Assist level: Minimal Assistance - Patient > 75% Assistive device: Cane-straight  Walk 10 feet on uneven surfaces activity   Assist level: Minimal Assistance - Patient > 75% Assistive device: Cane-straight  Stairs   Assist level: Minimal Assistance - Patient > 75% Stairs assistive device: 2 hand rails Max number of stairs: 8  Walk up/down 1 step activity   Walk up/down 1 step (curb) assist level: Minimal Assistance - Patient > 75% Walk up/down 1 step or curb assistive device: Cane;1 hand rail    Walk up/down 4 steps activity Walk up/down 4 steps assist level: Minimal Assistance - Patient > 75% Walk up/down 4 steps assistive device: 2 hand rails  Walk up/down 12 steps activity Walk up/down 12 steps activity did not occur: Safety/medical concerns      Pick up small objects from floor   Pick up small object from the floor assist level: Minimal Assistance - Patient > 75% Pick up small object from the floor assistive device: HAND HOLD  Wheelchair Will patient use wheelchair at discharge?: No          Wheel 50 feet with 2 turns activity      Wheel 150 feet activity        Refer to Care Plan for Long Term Goals  SHORT TERM GOAL WEEK 1 PT Short Term Goal 1 (Week 1): STG=LTG DUE TO LOS  Recommendations for other services: None   Skilled Therapeutic Intervention  Evaluation completed (see details above and below) with education on PT POC and goals and individual treatment initiated with focus on functional mobility/transfers, LE strength, dynamic standing balance/coordination, ambulation, stair navigation, simulated car transfers, and improved endurance with activity  Discussed significant falls hx and BERG balance assessment completed. Discussed score and indication of high  risk of falls (100% likelihood) and correlation with hx of > 9 falls in recent past.  Discussed need for RW at all times and continued IPR transitioning to OPT to address balance deficits.  Pt receptive to education.  Also discussed plan for HEP that he could work on as he is an avid Copy.  Discussed current need to move more slowly/less impulsively and to exercise increased caution during mobility.  Discussed safety in home setting, discussed BEFAST for stroke prevention/identification and increased risk of stroke following stroke.  Also discussed injury risk w/falling/sequela in older population.  Performed gait w/spc and RW, pt provided w/proper height RW for use in room w/nursing, and wc for transport.  Instructed pt w/parts management and propulsion technique.   Pt left oob in recliner w/chair alarm set and needs in reach.   PM Session: PAIN  Pt initially oob in recliner and agreeable to session w/focus on balance. Sit to stand and gait x 131f w/cga , mild balance loss w/turning to L, self recovery.  Balance activities: Floor clock w/dual UE support on walker progressing to single and to no UE support. cga w/dual, min w/single when wbing on RLE, min to mod w/no UE support particularly when wbing on RLE Decreased coordination RLE w/task  Standing tapping 5in step w/dual UE support on walker progressing to single and to no UE support. cga w/dual, min w/single when wbing on RLE, min to mod w/no UE support particularly when wbing on RLE Decreased coordination RLE w/task  Standing on foam dual UE/single UE/no UE support Added cervical rotation, nods, armswing.  Pt w/overall increase in sway on foam, mild balance loss w/cervicald rotation to L.  Discussed  consistency w/balance loss during gait when turning to L/wt shift to R.   Stepping forward/back over hockey stick w/dual UE support on walker progressing to single and to no UE support. cga w/dual, min w/single when wbing on RLE, min  to mod w/no UE support particularly when wbing on RLE Decreased coordination RLE w/task  Stepping side to side over hockey stick w/dual UE support on walker progressing to single and to no UE support. cga w/dual, min w/single when wbing on RLE, min to mod w/no UE support particularly when wbing on RLE Decreased coordination RLE w/task  Pt verbalized that he did not realize how much his RLE coordination and overall balance had been effected until todays session.  Discussed plan/tailored balance training and pt eager to transition to OPT and persue HEP.    Gait 173f w/RW and cga, single balance loss w/turning L, self recovered.  Pt left oob in recliner w/chair alarm set and needs in reach.  Wife at bedside and discussed balance, falls risk, recommendations.    Mobility Bed Mobility Bed Mobility: Rolling Right;Rolling Left;Supine to Sit;Sit to Supine Rolling Right: Independent Rolling Left: Independent Right Sidelying to Sit: Supervision/Verbal cueing Supine to Sit: Supervision/Verbal cueing Sit to Supine: Supervision/Verbal cueing Transfers Transfers: Sit to Stand;Stand to Sit Sit to Stand: Contact Guard/Touching assist Stand to Sit: Contact Guard/Touching assist Transfer (Assistive device): None Locomotion  Gait Ambulation: Yes Gait Assistance: Minimal Assistance - Patient > 75% Gait Distance (Feet): 150 Feet Assistive device: Straight cane Gait Assistance Details: decreased clearance RLE thru swing, mild R trendelenberg, unsteady Gait Gait: Yes Gait Pattern: Impaired Gait velocity: decreased Stairs / Additional Locomotion Stairs: Yes Stairs Assistance: Contact Guard/Touching assist Stair Management Technique: Two rails Number of Stairs: 8 Height of Stairs: 5 Ramp: Minimal Assistance - Patient >75% Curb: Minimal Assistance - Patient >75%   Discharge Criteria: Patient will be discharged from PT if patient refuses treatment 3 consecutive times without medical reason, if  treatment goals not met, if there is a change in medical status, if patient makes no progress towards goals or if patient is discharged from hospital.  The above assessment, treatment plan, treatment alternatives and goals were discussed and mutually agreed upon: by patient  BJerrilyn Cairo12/05/2019, 3:58 PM

## 2020-02-23 NOTE — Progress Notes (Addendum)
Went in to give pt medication at 1815, found wife at beside without mask on. Kindly educated pt/wife on Research scientist (life sciences). Wife put mask on at this time. Visitor policy discussed. Wife hesitant but signed the policy. Pt expresses being upset with this nurse. Turned bed alarm on before leaving, pt stated "I dont need a bed alarm, I barely moved". Educated on bed alarm policy and safety and turned bed exit to 3rd setting. Offered any further assistance and reminded pt/wife to call for help for any needs. Day shift charge nurse [facility] notified. Came back in for shift change at 1920, pt needed to use bathroom. Supervised pt towards bathroom door but pt could not get into bathroom from the way he was approaching and asked pt to walk past door so this nurse could open it. Pt yelling stating "you're the worst nurse", "you shouldn't be a nurse" and "I'm done with you."  Next shift nurse Kathlee Nations took over patient care at that time. Night shift charge nurse Katharine Look notified.  Sheela Stack, LPN

## 2020-02-24 MED ORDER — AMLODIPINE BESYLATE 2.5 MG PO TABS
2.5000 mg | ORAL_TABLET | Freq: Every day | ORAL | Status: DC
  Administered 2020-02-24 – 2020-02-27 (×4): 2.5 mg via ORAL
  Filled 2020-02-24 (×4): qty 1

## 2020-02-24 NOTE — Progress Notes (Signed)
[facility] [place] PHYSICAL MEDICINE & REHABILITATION PROGRESS NOTE   Subjective/Complaints: Slept well. Really enjoyed his therapy yesterday and felt that he could already see the difference from participating. "I should be ready by Tuesday."  ROS: Patient denies fever, rash, sore throat, blurred vision, nausea, vomiting, diarrhea, cough, shortness of breath or chest pain, joint or back pain, headache, or mood change.    Objective:   No results found. Recent Labs    02/23/20 0549  WBC 7.8  HGB 16.5  HCT 47.4  PLT 259   Recent Labs    02/23/20 0549  NA 138  K 3.7  CL 101  CO2 26  GLUCOSE 111*  BUN 22  CREATININE 1.17  CALCIUM 8.9    Intake/Output Summary (Last 24 hours) at 02/24/2020 1026 Last data filed at 02/23/2020 1855 Gross per 24 hour  Intake 377 ml  Output --  Net 377 ml        Physical Exam: Vital Signs Blood pressure (!) 135/97, pulse 88, temperature 98.5 F (36.9 C), resp. rate 17, height 5\' 7"  (1.702 m), weight 82.1 kg, SpO2 91 %.  Constitutional: No distress . Vital signs reviewed. HEENT: EOMI, oral membranes moist Neck: supple Cardiovascular: RRR without murmur. No JVD    Respiratory/Chest: CTA Bilaterally without wheezes or rales. Normal effort    GI/Abdomen: BS +, non-tender, non-distended Ext: no clubbing, cyanosis, or edema Psych: pleasant and cooperative Skin: Clean and intact without signs of breakdown Neuro: Pt is cognitively appropriate with normal insight, memory, and awareness. Cranial nerves 2-12 are intact. Sl dysarthria. No nystagmus. Minimal right limb ataxia. + truncal ataxia with loss of balance to the right. Slightly impulsive.  Sensory exam is normal. Reflexes are 2+ in all 4's. Fine motor coordination is intact. No tremors. Motor function is grossly 5/5.  Musculoskeletal: Full ROM, No pain with AROM or PROM in the neck, trunk, or extremities. Posture appropriate      Assessment/Plan: 1. Functional deficits which require 3+ hours  per day of interdisciplinary therapy in a comprehensive inpatient rehab setting.  Physiatrist is providing close team supervision and 24 hour management of active medical problems listed below.  Physiatrist and rehab team continue to assess barriers to discharge/monitor patient progress toward functional and medical goals  Care Tool:  Bathing    Body parts bathed by patient: Right arm, Left arm, Chest, Abdomen, Front perineal area, Buttocks, Left upper leg, Right upper leg, Right lower leg, Left lower leg, Face         Bathing assist Assist Level: Minimal Assistance - Patient > 75%     Upper Body Dressing/Undressing Upper body dressing   What is the patient wearing?: Pull over shirt    Upper body assist Assist Level: Contact Guard/Touching assist    Lower Body Dressing/Undressing Lower body dressing      What is the patient wearing?: Pants, Underwear/pull up     Lower body assist Assist for lower body dressing: Minimal Assistance - Patient > 75%     Toileting Toileting    Toileting assist Assist for toileting: Contact Guard/Touching assist     Transfers Chair/bed transfer  Transfers assist     Chair/bed transfer assist level: Contact Guard/Touching assist     Locomotion Ambulation   Ambulation assist      Assist level: Minimal Assistance - Patient > 75% Assistive device: Cane-straight Max distance: 150   Walk 10 feet activity   Assist     Assist level: Minimal Assistance - Patient >  75% Assistive device: Walker-rolling   Walk 50 feet activity   Assist    Assist level: Minimal Assistance - Patient > 75% Assistive device: Cane-straight    Walk 150 feet activity   Assist    Assist level: Minimal Assistance - Patient > 75% Assistive device: Cane-straight    Walk 10 feet on uneven surface  activity   Assist     Assist level: Minimal Assistance - Patient > 75% Assistive device: Government social research officer Will  patient use wheelchair at discharge?: No             Wheelchair 50 feet with 2 turns activity    Assist            Wheelchair 150 feet activity     Assist          Blood pressure (!) 135/97, pulse 88, temperature 98.5 F (36.9 C), resp. rate 17, height 5\' 7"  (1.702 m), weight 82.1 kg, SpO2 91 %.  Medical Problem List and Plan: 1.R sided cerebellar issues with gait and balance issuessecondary to right cerebellar peduncle infarction secondary to small vessel disease -patient may shower -ELOS/Goals: mod I- 7-9 days. Pt wanting shorter LOS  -continue therapies 2. Antithrombotics: -DVT/anticoagulation:SCDs -antiplatelet therapy: Aspirin 81 mg daily and Plavix 75 mg daily x3 weeks then aspirin alone 3. Pain Management/chronic back pain:Neurontin 600 mg 3 times daily, hydrocodone as needed 4. Mood:Ativan 1 mg every 8 hours as needed anxiety provide emotional support -antipsychotic agents: N/A 5. Neuropsych: This patientiscapable of making decisions on hisown behalf. 6. Skin/Wound Care:Routine skin checks 7. Fluids/Electrolytes/Nutrition:fair intake so far  -labs  wnl 8. Cardiomyopathy. Ejection fraction 30 to 35%. Follow-up cardiology services  Qod weights requested Filed Weights   02/22/20 1524 02/22/20 1548  Weight: 82.1 kg 82.1 kg    9. Carotid stenosis. Left ICA origin 70% stenosis. Recommend vascular outpatient follow-up 10. Permissive hypertension. Patient on Norvasc 2.5 mg daily prior to admission.    -will resume norvasc today 12/4 11. BPH. Flomax 0.4 mg daily. Voiding well 12. GERD. Protonix 13. Hyperlipidemia. Zetia/Pravachol 14.Tobacco abuse.Counseling 15. Word finding issues- pt specifically asking for SLP to work on this issue.    - more dysarthria than anything else  LOS: 2 days A FACE TO FACE EVALUATION WAS PERFORMED  Meredith Staggers 02/24/2020, 10:26 AM

## 2020-02-25 ENCOUNTER — Inpatient Hospital Stay (HOSPITAL_COMMUNITY): Payer: PPO | Admitting: Occupational Therapy

## 2020-02-25 ENCOUNTER — Inpatient Hospital Stay (HOSPITAL_COMMUNITY): Payer: PPO

## 2020-02-25 NOTE — IPOC Note (Signed)
Overall Plan of Care [facility]) Patient Details Name: Ruben Barrera MRN: [id number] DOB: [birth year]  Admitting Diagnosis: Acute stroke due to occlusion of right cerebellar artery [facility])  Hospital Problems: Principal Problem:   Acute stroke due to occlusion of right cerebellar artery ([place])     Functional Problem List: Nursing Pain, Endurance, Medication Management, Safety  PT Balance  OT Balance, Behavior, Endurance, Cognition, Motor, Perception, Pain, Safety, Sensory  SLP    TR         Basic ADL's: OT Grooming, Toileting, Dressing, Bathing     Advanced  ADL's: OT       Transfers: PT Bed Mobility, Bed to Chair, Car, Manufacturing systems engineer, Metallurgist: PT Ambulation, Stairs     Additional Impairments: OT Fuctional Use of Upper Extremity  SLP        TR      Anticipated Outcomes Item Anticipated Outcome  Self Feeding n/a  Swallowing      Basic self-care  Supervision  Toileting  Supervision   Bathroom Transfers Supervision  Bowel/Bladder  manage bowel and bladder with mod I assist  Transfers  mod i  Locomotion  mod I  Communication     Cognition     Pain  Pain level less than 4 on scale of 0-10  Safety/Judgment  remain free of injury, prevent falls with cues and reminders   Therapy Plan: PT Intensity: Minimum of 1-2 x/day ,45 to 90 minutes PT Frequency: 5 out of 7 days PT Duration Estimated Length of Stay: 5-7 days OT Intensity: Minimum of 1-2 x/day, 45 to 90 minutes OT Frequency: 5 out of 7 days OT Duration/Estimated Length of Stay: 1 week     Due to the current state of emergency, patients may not be receiving their 3-hours of Medicare-mandated therapy.   Team Interventions: Nursing Interventions Patient/Family Education, Discharge Planning, Psychosocial Support, Pain Management, Medication Management  PT interventions Ambulation/gait training, Neuromuscular re-education, Stair training, UE/LE Strength taining/ROM, Medical illustrator  training, Discharge planning, Pain management, Therapeutic Activities, UE/LE Coordination activities, Functional mobility training, Patient/family education, Therapeutic Exercise, Disease management/prevention, DME/adaptive equipment instruction  OT Interventions Balance/vestibular training, Cognitive remediation/compensation, Community reintegration, Discharge planning, Disease mangement/prevention, DME/adaptive equipment instruction, Functional electrical stimulation, Functional mobility training, Neuromuscular re-education, Patient/family education, Pain management, Psychosocial support, Self Care/advanced ADL retraining, Skin care/wound managment, Splinting/orthotics, Therapeutic Exercise, Therapeutic Activities, UE/LE Strength taining/ROM, UE/LE Coordination activities, Wheelchair propulsion/positioning  SLP Interventions    TR Interventions    SW/CM Interventions Discharge Planning, Psychosocial Support, Patient/Family Education   Barriers to Discharge MD  Medical stability  Nursing      PT Other (comments) balance and very strong hx of recent falls  OT      SLP      SW Decreased caregiver support, Lack of/limited family support     Team Discharge Planning: Destination: PT-Home ,OT- Home , SLP-  Projected Follow-up: PT-Home health PT, OT-  Home health OT, SLP-  Projected Equipment Needs: PT-Rolling walker with 5" wheels, OT- To be determined, SLP-  Equipment Details: PT- , OT-  Patient/family involved in discharge planning: PT- Patient,  OT-Patient, SLP-   MD ELOS: 5-7 days Medical Rehab Prognosis:  Excellent Assessment: The patient has been admitted for CIR therapies with the diagnosis of right cerebellar peduncle infarct . The team will be addressing functional mobility, strength, stamina, balance, safety, adaptive techniques and equipment, self-care, bowel and bladder mgt, patient and caregiver education, NMR, vestibular assessment, community reentry. Goals have been  set at mod I  to supervision.   Due to the current state of emergency, patients may not be receiving their 3 hours per day of Medicare-mandated therapy.    Meredith Staggers, MD, FAAPMR      See Team Conference Notes for weekly updates to the plan of care

## 2020-02-25 NOTE — Progress Notes (Signed)
Occupational Therapy Session Note  Patient Details  Name: Ruben Barrera MRN: [id number] Date of Birth: 1932-05-01  Today's Date: 02/25/2020 OT Individual Time: 0962-8366 and 2947-6546 OT Individual Time Calculation (min): 55 min and 41 min  Short Term Goals: Week 1:  OT Short Term Goal 1 (Week 1): LTG=STG 2/2 ELOS  Skilled Therapeutic Interventions/Progress Updates:    Pt greeted in the recliner, declining shower but agreeable to tx. He reports having a tub bench (without a back) in his walk-in shower at home. Pt ambulated to the therapy apartment using RW with supervision assist, some Lt veering of device but no overt LOBs. OT explained and demonstrated backup technique for shower transfer with pt completing transfer himself with CGA on 1st trial, supervision on the 2nd trial. Pt reports he does some light meal prep at home, practiced kitchen mobility with education placed on proper walker placement when retrieving items as he tended to position himself in a manner where he had to overly reach. Walker bags were out of supply but recommended pt to purchase one for using during meal prep at home. He then returned to the room and completed oral care and shaving while w/c level with setup assist. Also able to show OT that he could doff/don his sneakers without assistance. Supervision for ambulation down hallway and back to room, ~530 ft using RW with supervision. He requires cues for walker placement and hand placement before stand<sit transitions. At end of session pt remained in the recliner with all needs within reach and chair alarm set.   2nd Session 1:1 tx (41 min) Pt greeted in the recliner with no c/o pain. ADL needs met, pt motivated to go outdoors for tx. Worked on higher level balance by ambulating over tile, concrete, and floor mats in community hospital settings using RW. Vcs for safely transferring to a chair in the food court and also for careful negotiation over floor mats before walking  outside. Pt required CGA for balance when ambulating over floor mats, education provided on eliminating floor mats from home to decrease fall risk. Pt required supervision for mobility overall excluding the floor mats. He continues to require vcs for walker mgt/hand placement during stand<sit transitions. Pt also reported that "50%" of his falls at home were attributed to his large dog. Discussed ways to decrease his fall risk when at home with the dog. Pt appeared minimally receptive to this education. Pt ambulated back to the room and transferred to his recliner. Left him with all needs within reach and chair alarm set.   Therapy Documentation Precautions:  Precautions Precautions: Fall Restrictions Weight Bearing Restrictions: No Pain:Lt LE, premedicated and pt reported pain was manageable for tx (1st session)   ADL: ADL Eating: Set up Grooming: Supervision/safety Upper Body Bathing: Contact guard Lower Body Bathing: Minimal assistance Upper Body Dressing: Contact guard Lower Body Dressing: Minimal assistance Toileting: Minimal assistance Toilet Transfer: Minimal assistance Walk-In Shower Transfer: Minimal assistance      Therapy/Group: Individual Therapy  Michaela A Hoffman 02/25/2020, 12:38 PM

## 2020-02-25 NOTE — Progress Notes (Signed)
[facility] PHYSICAL MEDICINE & REHABILITATION PROGRESS NOTE   Subjective/Complaints: Ready to work again today! Still plans on going home Tuesday! Feels that right side is stronger  ROS: Patient denies fever, rash, sore throat, blurred vision, nausea, vomiting, diarrhea, cough, shortness of breath or chest pain, joint or back pain, headache, or mood change.     Objective:   No results found. Recent Labs    02/23/20 0549  WBC 7.8  HGB 16.5  HCT 47.4  PLT 259   Recent Labs    02/23/20 0549  NA 138  K 3.7  CL 101  CO2 26  GLUCOSE 111*  BUN 22  CREATININE 1.17  CALCIUM 8.9    Intake/Output Summary (Last 24 hours) at 02/25/2020 1026 Last data filed at 02/24/2020 1851 Gross per 24 hour  Intake 460 ml  Output --  Net 460 ml        Physical Exam: Vital Signs Blood pressure (!) 109/91, pulse 87, temperature 97.9 F (36.6 C), temperature source Oral, resp. rate 16, height 5\' 7"  (1.702 m), weight 82.1 kg, SpO2 97 %.  Constitutional: No distress . Vital signs reviewed. HEENT: EOMI, oral membranes moist Neck: supple Cardiovascular: RRR without murmur. No JVD    Respiratory/Chest: CTA Bilaterally without wheezes or rales. Normal effort    GI/Abdomen: BS +, non-tender, non-distended Ext: no clubbing, cyanosis, or edema Psych: pleasant and cooperative Neuro: Pt is cognitively appropriate with normal insight, memory, and awareness. Cranial nerves 2-12 are intact. Ongoing dysarthria. No nystagmus. Minimal right limb ataxia. + truncal ataxia with loss of balance to the right. Slightly impulsive.  Sensory exam is normal. Reflexes are 2+ in all 4's. Fine motor coordination is fair RUE.Marland Kitchen No tremors. Motor function is grossly 5/5.  Musculoskeletal: Full ROM, No pain with AROM or PROM in the neck, trunk, or extremities. Posture appropriate      Assessment/Plan: 1. Functional deficits which require 3+ hours per day of interdisciplinary therapy in a comprehensive inpatient rehab  setting.  Physiatrist is providing close team supervision and 24 hour management of active medical problems listed below.  Physiatrist and rehab team continue to assess barriers to discharge/monitor patient progress toward functional and medical goals  Care Tool:  Bathing    Body parts bathed by patient: Right arm, Left arm, Chest, Abdomen, Front perineal area, Buttocks, Left upper leg, Right upper leg, Right lower leg, Left lower leg, Face         Bathing assist Assist Level: Minimal Assistance - Patient > 75%     Upper Body Dressing/Undressing Upper body dressing   What is the patient wearing?: Pull over shirt    Upper body assist Assist Level: Contact Guard/Touching assist    Lower Body Dressing/Undressing Lower body dressing      What is the patient wearing?: Pants, Underwear/pull up     Lower body assist Assist for lower body dressing: Minimal Assistance - Patient > 75%     Toileting Toileting    Toileting assist Assist for toileting: Minimal Assistance - Patient > 75%     Transfers Chair/bed transfer  Transfers assist     Chair/bed transfer assist level: Supervision/Verbal cueing     Locomotion Ambulation   Ambulation assist      Assist level: Minimal Assistance - Patient > 75% Assistive device: Cane-straight Max distance: 150   Walk 10 feet activity   Assist     Assist level: Minimal Assistance - Patient > 75% Assistive device: Walker-rolling   Walk 50  feet activity   Assist    Assist level: Minimal Assistance - Patient > 75% Assistive device: Cane-straight    Walk 150 feet activity   Assist    Assist level: Minimal Assistance - Patient > 75% Assistive device: Cane-straight    Walk 10 feet on uneven surface  activity   Assist     Assist level: Minimal Assistance - Patient > 75% Assistive device: Government social research officer Will patient use wheelchair at discharge?: No             Wheelchair  50 feet with 2 turns activity    Assist            Wheelchair 150 feet activity     Assist          Blood pressure (!) 109/91, pulse 87, temperature 97.9 F (36.6 C), temperature source Oral, resp. rate 16, height 5\' 7"  (1.702 m), weight 82.1 kg, SpO2 97 %.  Medical Problem List and Plan: 1.R sided cerebellar issues with gait and balance issuessecondary to right cerebellar peduncle infarction secondary to small vessel disease -patient may shower -ELOS/Goals: mod I- 7-9 days. Pt wanting shorter LOS (TUES)  -continue therapies 2. Antithrombotics: -DVT/anticoagulation:SCDs -antiplatelet therapy: Aspirin 81 mg daily and Plavix 75 mg daily x3 weeks then aspirin alone 3. Pain Management/chronic back pain:Neurontin 600 mg 3 times daily, hydrocodone as needed 4. Mood:Ativan 1 mg every 8 hours as needed anxiety provide emotional support -antipsychotic agents: N/A 5. Neuropsych: This patientiscapable of making decisions on hisown behalf. 6. Skin/Wound Care:Routine skin checks 7. Fluids/Electrolytes/Nutrition:fair intake so far  -labs  wnl 8. Cardiomyopathy. Ejection fraction 30 to 35%. Follow-up cardiology services  Qod weights requested Filed Weights   02/22/20 1524 02/22/20 1548  Weight: 82.1 kg 82.1 kg    9. Carotid stenosis. Left ICA origin 70% stenosis. Recommend vascular outpatient follow-up 10. Permissive hypertension. Patient on Norvasc 2.5 mg daily prior to admission.    -resumed low dose norvasc 12/4 11. BPH. Flomax 0.4 mg daily. Voiding well 12. GERD. Protonix 13. Hyperlipidemia. Zetia/Pravachol 14.Tobacco abuse.Counseling 15. Word finding issues- pt specifically asking for SLP to work on this issue.    - more dysarthria than anything else  LOS: 3 days A FACE TO FACE EVALUATION WAS PERFORMED  Meredith Staggers 02/25/2020, 10:26 AM

## 2020-02-25 NOTE — Plan of Care (Signed)
  Problem: RH Balance Goal: LTG Patient will maintain dynamic standing balance (PT) Description: LTG:  Patient will maintain dynamic standing balance with assistance during mobility activities (PT) Flowsheets (Taken 02/23/2020 1634 by Jerrilyn Cairo, PT) LTG: Pt will maintain dynamic standing balance during mobility activities with:: Independent with assistive device    Problem: Sit to Stand Goal: LTG:  Patient will perform sit to stand with assistance level (PT) Description: LTG:  Patient will perform sit to stand with assistance level (PT) Flowsheets (Taken 02/23/2020 1634 by Jerrilyn Cairo, PT) LTG: PT will perform sit to stand in preparation for functional mobility with assistance level: Independent with assistive device   Problem: RH Bed to Chair Transfers Goal: LTG Patient will perform bed/chair transfers w/assist (PT) Description: LTG: Patient will perform bed to chair transfers with assistance (PT). Flowsheets (Taken 02/23/2020 1634 by Jerrilyn Cairo, PT) LTG: Pt will perform Bed to Chair Transfers with assistance level: Independent with assistive device    Problem: RH Car Transfers Goal: LTG Patient will perform car transfers with assist (PT) Description: LTG: Patient will perform car transfers with assistance (PT). Flowsheets (Taken 02/23/2020 1634 by Jerrilyn Cairo, PT) LTG: Pt will perform car transfers with assist:: Supervision/Verbal cueing   Problem: RH Furniture Transfers Goal: LTG Patient will perform furniture transfers w/assist (OT/PT) Description: LTG: Patient will perform furniture transfers  with assistance (OT/PT). Flowsheets (Taken 02/23/2020 1634 by Jerrilyn Cairo, PT) LTG: Pt will perform furniture transfers with assist:: Independent with assistive device    Problem: RH Ambulation Goal: LTG Patient will ambulate in controlled environment (PT) Description: LTG: Patient will ambulate in a controlled environment, # of feet with  assistance (PT). Flowsheets (Taken 02/23/2020 1634 by Jerrilyn Cairo, PT) LTG: Pt will ambulate in controlled environ  assist needed:: Independent with assistive device LTG: Ambulation distance in controlled environment: 150 Goal: LTG Patient will ambulate in home environment (PT) Description: LTG: Patient will ambulate in home environment, # of feet with assistance (PT). Flowsheets Taken 02/25/2020 1814 by Apolinar Junes L, PT LTG: Pt will ambulate in home environ  assist needed:: Independent with assistive device Taken 02/23/2020 1634 by Jerrilyn Cairo, PT LTG: Ambulation distance in home environment: 50   Problem: RH Stairs Goal: LTG Patient will ambulate up and down stairs w/assist (PT) Description: LTG: Patient will ambulate up and down # of stairs with assistance (PT) Flowsheets (Taken 02/23/2020 1634 by Jerrilyn Cairo, PT) LTG: Pt will ambulate up/down stairs assist needed:: Supervision/Verbal cueing LTG: Pt will  ambulate up and down number of stairs: 4   Problem: RH Floor Transfers Goal: LTG Patient will perform floor transfers w/assist (PT) Description: LTG: Patient will perform floor transfers with assistance (PT). Flowsheets (Taken 02/25/2020 1817) LTG: PT WILL PERFORM FLOOR TRANFERS  WITH  ASSIST:: Supervision/Verbal cueing

## 2020-02-25 NOTE — Progress Notes (Signed)
Physical Therapy Session Note  Patient Details  Name: Ruben Barrera MRN: [id number] Date of Birth: 1932-07-13  Today's Date: 02/25/2020 PT Individual Time: 3976-7341 and 1440-1540 PT Individual Time Calculation (min): 35 min and 60 min  Short Term Goals: Week 1:  PT Short Term Goal 1 (Week 1): STG=LTG DUE TO LOS  Skilled Therapeutic Interventions/Progress Updates:     Session 1: Patient in bed upon PT arrival. Patient alert and agreeable to PT session. Patient reported 6-7/10 back pain during session, RN made aware. PT provided repositioning, rest breaks, and distraction as pain interventions throughout session.   Therapeutic Activity: Bed Mobility: Patient performed supine to/from sit with mod I with HOB slightly elevated.  Transfers: Patient performed sit to/from stand x7 with supervision with and without RW. Provided verbal cues for hand placement on RW and reaching back to sit for safety, patient tends to leave both hands on RW at all times. Performed ambulating toilet transfer with CGA using RW, gait as described below. Continent of bladder during toileting, performed peri-care and lower body clothing management independently.  Five times Sit to Stand Test (FTSS) Method: Use a straight back chair with a solid seat that is 16-18" high. Ask participant to sit on the chair with arms folded across their chest.   Instructions: "Stand up and sit down as quickly as possible 5 times, keeping your arms folded across your chest."   Measurement: Stop timing when the participant stands the 5th time.  TIME: __12.2____ (in seconds)  Times > 13.6 seconds is associated with increased disability and morbidity (Guralnik, 2000) Times > 15 seconds is predictive of recurrent falls in healthy individuals aged 72 and older (Buatois, et al., 2008) Normal performance values in community dwelling individuals aged 22 and older (Bohannon, 2006): o 60-69 years: 11.4 seconds o 70-79 years: 12.6  seconds o 80-89 years: 14.8 seconds  MCID: ? 2.3 seconds for Vestibular Disorders (Meretta, 2006)  Gait Training:  Patient ambulated >50 feet x2 using RW with CGA. Ambulated with decreased R knee/hip flexion leading to intermittent toe catching, patient able to self correct without increased assist, increased R knee flexion in stance, and increased trunk flexion. Provided verbal cues for erect posture, increased hamstring activation during pre-swing, and increased R quad and lateral hip activation in stance for improved stability.  Neuromuscular Re-ed: Patient performed the Functional Gait Assessment (see details below): Patient demonstrates increased fall risk with dynamic gait as noted by score of 5/30 on Functional Gait Assessment. (<19/30=increased risk of falls with functional gait).  Patient in recliner in the room at end of session with breaks locked, chair alarm set, and all needs within reach.   Session 2: Patient in recliner in the room upon PT arrival. Patient alert and agreeable to PT session. Patient denied pain during session. Focused session on determining safety of home workout routine, addition of balance exercises to home routine, and d/c planning.   Patient adamant to d/c on Tuesday throughout session. Patient's wife arrived at end of session. Reports patient will have 24/7 supervision if needed for safety. Stated she will be available for family education on Tuesday from 8-10 am, patient agreeable to stay for therapy on Tuesday, but requesting to d/c home after family education. Patient agreeable to outpatient therapy services for follow-up and will need RW for safe functional mobility due to increased fall risk and history of falls at baseline.  Therapeutic Activity: Transfers: Patient performed sit to/from stand from the hospital recliner, mat table, standard chair  without arms, and NuStep with close supervision. Provided verbal cues for pushing up and reaching back to stand  for safety using RW. Patient performed a floor transfer to simulate prior home exercise routine. Performed transfers with CGA-close supervision from mat table to/from floor mat to simulate ottoman to/from floor mat at home.   Gait Training:  Patient ambulated >50 feet and >100 feet x2 using RW with CGA-close supervision. Ambulated as above, with increased R toe catching on turns, requiring min A x1 due to LOB. Provided verbal cues for safety awareness on turns, increased hamstring activation in pre-swing on R, increased lateral hip stability in stance R>L, and looking ahead.  Neuromuscular Re-ed: Patient performed the following activities from the [place] [facility] Exercise program as provided for HEP: -SLS R/L x10 sec in corner with single upper extremity support for safety -tandem stance R/L x30 sec in corner for safety -hamstring curls R/L -side stepping at counter R/L 2x10 ft -backwards walking at counter 2x10 ft -figure 8 walking with RW x4 -sit to/from stand without upper extremity support x10 Adjusted HEP to fit patient's current level of challenge for improved balance with functional mobility  Therapeutic Exercise: Patient performed the following exercises that patient performs at home with verbal and tactile cues for proper technique. -NuStep (to simulate recumbent bike at home) x7 min on level 4 then level 3 due to elevated HR (153 bpm) with mild SOB, recovered to 92 bpm in <2 min -seated bicep curls R/L with 7 lb weight x12 -supine chest press with 7lb weights x12 -supine chest flies with 7lb weights x12 -seated rows with green band x10, encouraged patient to transition exercise to sitting from standing for increased safety due to balance deficits  Patient in recliner with his wife in the room at end of session with breaks locked, chair alarm set, and all needs within reach.   Therapy Documentation Precautions:  Precautions Precautions: Fall Restrictions Weight Bearing Restrictions:  No   Therapy/Group: Individual Therapy  Rilen Shukla L Marvelene Stoneberg PT, DPT  02/25/2020, 6:53 PM

## 2020-02-26 ENCOUNTER — Inpatient Hospital Stay (HOSPITAL_COMMUNITY): Payer: PPO | Admitting: Occupational Therapy

## 2020-02-26 ENCOUNTER — Inpatient Hospital Stay (HOSPITAL_COMMUNITY): Payer: PPO

## 2020-02-26 DIAGNOSIS — I5022 Chronic systolic (congestive) heart failure: Secondary | ICD-10-CM

## 2020-02-26 DIAGNOSIS — I63541 Cerebral infarction due to unspecified occlusion or stenosis of right cerebellar artery: Secondary | ICD-10-CM

## 2020-02-26 DIAGNOSIS — E785 Hyperlipidemia, unspecified: Secondary | ICD-10-CM

## 2020-02-26 MED ORDER — SENNOSIDES-DOCUSATE SODIUM 8.6-50 MG PO TABS: 1.0000 | ORAL_TABLET | Freq: Two times a day (BID) | ORAL | Status: AC

## 2020-02-26 MED ORDER — EZETIMIBE 10 MG PO TABS: 10.0000 mg | ORAL_TABLET | Freq: Every evening | ORAL | 0 refills | Status: AC

## 2020-02-26 MED ORDER — HYDROCODONE-ACETAMINOPHEN 7.5-325 MG PO TABS: 1.0000 | ORAL_TABLET | Freq: Three times a day (TID) | ORAL | 0 refills | Status: AC | PRN

## 2020-02-26 MED ORDER — TAMSULOSIN HCL 0.4 MG PO CAPS: 0.4000 mg | ORAL_CAPSULE | Freq: Every day | ORAL | 0 refills | Status: AC

## 2020-02-26 MED ORDER — LOSARTAN POTASSIUM 25 MG PO TABS: 12.5000 mg | ORAL_TABLET | Freq: Every day | ORAL | 0 refills | Status: DC

## 2020-02-26 MED ORDER — ACETAMINOPHEN 325 MG PO TABS: 650.0000 mg | ORAL_TABLET | ORAL | Status: DC | PRN

## 2020-02-26 MED ORDER — CLOPIDOGREL BISULFATE 75 MG PO TABS: 75.0000 mg | ORAL_TABLET | Freq: Every day | ORAL | 0 refills | Status: AC

## 2020-02-26 MED ORDER — CITALOPRAM HYDROBROMIDE 10 MG PO TABS: 10.0000 mg | ORAL_TABLET | Freq: Every day | ORAL | 0 refills | Status: DC

## 2020-02-26 MED ORDER — METOPROLOL SUCCINATE ER 25 MG PO TB24: 12.5000 mg | ORAL_TABLET | Freq: Every day | ORAL | 0 refills | Status: DC

## 2020-02-26 MED ORDER — LORAZEPAM 1 MG PO TABS: 1.0000 mg | ORAL_TABLET | Freq: Three times a day (TID) | ORAL | 0 refills | Status: DC | PRN

## 2020-02-26 MED ORDER — GABAPENTIN 600 MG PO TABS: 600.0000 mg | ORAL_TABLET | Freq: Three times a day (TID) | ORAL | 0 refills | Status: DC

## 2020-02-26 MED ORDER — PRAVASTATIN SODIUM 20 MG PO TABS: 20.0000 mg | ORAL_TABLET | Freq: Every day | ORAL | 0 refills | Status: DC

## 2020-02-26 MED ORDER — AMLODIPINE BESYLATE 2.5 MG PO TABS: 2.5000 mg | ORAL_TABLET | Freq: Every day | ORAL | 0 refills | Status: DC

## 2020-02-26 NOTE — Discharge Summary (Signed)
Physician Discharge Summary  Patient ID: CHRISTOPHE RISING MRN: [id number] DOB/AGE: [birth year] 86 y.o.  Admit date: 02/22/2020 Discharge date: 02/27/2020  Discharge Diagnoses:  Principal Problem:   Acute stroke due to occlusion of right cerebellar artery (HCC) Active Problems:   Dyslipidemia   Chronic systolic congestive heart failure (HCC) Cardiomyopathy Carotid stenosis Permissive hypertension BPH GERD Hyperlipidemia Tobacco abuse  Discharged Condition: Stable  Significant Diagnostic Studies: CT Code Stroke CTA Head W/WO contrast  Result Date: 02/21/2020 CLINICAL DATA:  Stroke workup EXAM: CT ANGIOGRAPHY HEAD AND NECK TECHNIQUE: Multidetector CT imaging of the head and neck was performed using the standard protocol during bolus administration of intravenous contrast. Multiplanar CT image reconstructions and MIPs were obtained to evaluate the vascular anatomy. Carotid stenosis measurements (when applicable) are obtained utilizing NASCET criteria, using the distal internal carotid diameter as the denominator. CONTRAST:  47mL OMNIPAQUE IOHEXOL 350 MG/ML SOLN COMPARISON:  CT and brain MRI from yesterday. FINDINGS: CTA NECK FINDINGS Aortic arch: Atheromatous plaque.  Three vessel branching. Right carotid system: Scattered mainly calcified atheromatous plaque primarily at the bifurcation. No flow limiting stenosis, ulceration, or beading. Left carotid system: Calcified plaque primarily at the bifurcation/bulb with at least 70% stenosis as measured on sagittal reformats. No ulceration or beading. Vertebral arteries: Proximal subclavian atherosclerosis without flow limiting stenosis. The left vertebral artery is dominant. Vertebral arteries are smooth and widely patent to the dura. Skeleton: Advanced, generalized disc and facet degeneration with C3-4 anterolisthesis. Partially covered left glenohumeral joint with intra-articular bodies in the medial recess. Other neck: No masslike or inflammatory  finding seen. Upper chest: No acute finding Review of the MIP images confirms the above findings CTA HEAD FINDINGS Anterior circulation: Calcified plaque along the carotid siphons. No branch occlusion, beading, or proximal flow limiting stenosis. Negative for aneurysm. Posterior circulation: Left dominant vertebral artery. Patent bilateral PICA. Patent bilateral superior cerebellar arteries with symmetric enhancement. A faint right AICA is present. Advanced right P2 segment stenosis. High-grade left P3 segment stenosis. No beading or aneurysm. Venous sinuses: Unremarkable in the arterial phase Anatomic variants: None significant Review of the MIP images confirms the above findings IMPRESSION: 1. No emergent finding.  No evident cerebellar branch occlusion. 2. At least 70% left ICA origin stenosis. 3. High-grade right P2 and left P3 atheromatous stenoses. Electronically Signed   By: Monte Fantasia M.D.   On: 02/21/2020 08:13   CT HEAD WO CONTRAST  Result Date: 02/20/2020 CLINICAL DATA:  Slurred speech, blurred vision. EXAM: CT HEAD WITHOUT CONTRAST TECHNIQUE: Contiguous axial images were obtained from the base of the skull through the vertex without intravenous contrast. COMPARISON:  May 24, 2019. FINDINGS: Brain: Mild diffuse cortical atrophy is noted. Mild chronic ischemic white matter disease is noted. No mass effect or midline shift is noted. Ventricular size is within normal limits. There is no evidence of mass lesion or hemorrhage. Low density is noted in the region of the right cerebellar peduncle concerning for infarction of indeterminate age. Vascular: No hyperdense vessel or unexpected calcification. Skull: Normal. Negative for fracture or focal lesion. Sinuses/Orbits: No acute finding. Other: None. IMPRESSION: Mild diffuse cortical atrophy. Mild chronic ischemic white matter disease. Low density is noted in the region of the right cerebellar peduncle concerning for infarction of indeterminate age.  MRI is recommended for further evaluation. Electronically Signed   By: Marijo Conception M.D.   On: 02/20/2020 13:12   CT Code Stroke CTA Neck W/WO contrast  Result Date: 02/21/2020 CLINICAL DATA:  Stroke workup  EXAM: CT ANGIOGRAPHY HEAD AND NECK TECHNIQUE: Multidetector CT imaging of the head and neck was performed using the standard protocol during bolus administration of intravenous contrast. Multiplanar CT image reconstructions and MIPs were obtained to evaluate the vascular anatomy. Carotid stenosis measurements (when applicable) are obtained utilizing NASCET criteria, using the distal internal carotid diameter as the denominator. CONTRAST:  37mL OMNIPAQUE IOHEXOL 350 MG/ML SOLN COMPARISON:  CT and brain MRI from yesterday. FINDINGS: CTA NECK FINDINGS Aortic arch: Atheromatous plaque.  Three vessel branching. Right carotid system: Scattered mainly calcified atheromatous plaque primarily at the bifurcation. No flow limiting stenosis, ulceration, or beading. Left carotid system: Calcified plaque primarily at the bifurcation/bulb with at least 70% stenosis as measured on sagittal reformats. No ulceration or beading. Vertebral arteries: Proximal subclavian atherosclerosis without flow limiting stenosis. The left vertebral artery is dominant. Vertebral arteries are smooth and widely patent to the dura. Skeleton: Advanced, generalized disc and facet degeneration with C3-4 anterolisthesis. Partially covered left glenohumeral joint with intra-articular bodies in the medial recess. Other neck: No masslike or inflammatory finding seen. Upper chest: No acute finding Review of the MIP images confirms the above findings CTA HEAD FINDINGS Anterior circulation: Calcified plaque along the carotid siphons. No branch occlusion, beading, or proximal flow limiting stenosis. Negative for aneurysm. Posterior circulation: Left dominant vertebral artery. Patent bilateral PICA. Patent bilateral superior cerebellar arteries with  symmetric enhancement. A faint right AICA is present. Advanced right P2 segment stenosis. High-grade left P3 segment stenosis. No beading or aneurysm. Venous sinuses: Unremarkable in the arterial phase Anatomic variants: None significant Review of the MIP images confirms the above findings IMPRESSION: 1. No emergent finding.  No evident cerebellar branch occlusion. 2. At least 70% left ICA origin stenosis. 3. High-grade right P2 and left P3 atheromatous stenoses. Electronically Signed   By: Monte Fantasia M.D.   On: 02/21/2020 08:13   MR BRAIN WO CONTRAST  Result Date: 02/20/2020 CLINICAL DATA:  Right-sided weakness, slurred speech, blurry vision EXAM: MRI HEAD WITHOUT CONTRAST TECHNIQUE: Multiplanar, multiecho pulse sequences of the brain and surrounding structures were obtained without intravenous contrast. COMPARISON:  05/24/2019 FINDINGS: Brain: There is area of mildly reduced diffusion within the right brachium pontis measuring approximately 1.4 cm. There is corresponding T2 hyperintensity with rounded appearance. Additional patchy and confluent areas of T2 hyperintensity in the supratentorial white matter are nonspecific but probably reflect moderate to advanced chronic microvascular ischemic changes. Small chronic right cerebellar infarct. There is no intracranial hemorrhage. There is no intracranial mass or significant mass effect. There is no hydrocephalus or extra-axial fluid collection. Prominence of the ventricles and sulci reflects generalized parenchymal volume loss similar to the prior study. Vascular: Major vessel flow voids at the skull base are preserved. Skull and upper cervical spine: Normal marrow signal is preserved. Sinuses/Orbits: Minor mucosal thickening. Bilateral lens replacements. Other: Sella is unremarkable.  Mastoid air cells are clear. IMPRESSION: Area of abnormal signal within the right middle cerebellar peduncle may reflect acute to subacute infarct. Given rounded appearance,  follow-up is recommended ensure appropriate evolution (suggest addition of post contrast imaging at that time). Moderate to advanced chronic microvascular ischemic changes. Small chronic right cerebellar infarct. Electronically Signed   By: Macy Mis M.D.   On: 02/20/2020 15:44   MR BRAIN W CONTRAST  Result Date: 02/20/2020 CLINICAL DATA:  Right middle cerebellar peduncle lesion. EXAM: MRI HEAD WITH CONTRAST TECHNIQUE: Multiplanar, multiecho pulse sequences of the brain and surrounding structures were obtained with intravenous contrast. CONTRAST:  54mL  GADAVIST GADOBUTROL 1 MMOL/ML IV SOLN COMPARISON:  Brain MRI without contrast 02/20/2020 FINDINGS: There is no abnormal contrast enhancement at the site of the right middle cerebellar peduncle lesion. There is a punctate focus of contrast enhancement within the posterior right cerebellar hemisphere (series 5, image 43) that is favored to be vascular. IMPRESSION: 1. No abnormal contrast enhancement at the site of the right middle cerebellar peduncle lesion. 2. Punctate focus of enhancement in the posterior right cerebellar hemispheres favored to be vascular, but attention on follow-up scan in 6-12 weeks recommended. Electronically Signed   By: Ulyses Jarred M.D.   On: 02/20/2020 19:19   DG CHEST PORT 1 VIEW  Result Date: 02/20/2020 CLINICAL DATA:  CVA EXAM: PORTABLE CHEST 1 VIEW COMPARISON:  10/31/2003 FINDINGS: Single frontal view of the chest demonstrates mild enlargement the cardiac silhouette, accentuated by portable AP technique. No airspace disease, effusion, or pneumothorax. Significant bilateral shoulder osteoarthritis. No acute fracture. IMPRESSION: 1. Enlarged cardiac silhouette. 2. No acute airspace disease. Electronically Signed   By: Randa Ngo M.D.   On: 02/20/2020 23:50   ECHOCARDIOGRAM COMPLETE  Result Date: 02/21/2020    ECHOCARDIOGRAM REPORT   Patient Name:   Ruben Barrera Date of Exam: 02/21/2020 Medical Rec #:  [id number]       Height:       67.0 in Accession #:    [id number]     Weight:       191.2 lb Date of Birth:  1932/06/27       BSA:          1.984 m Patient Age:    31 years       BP:           154/104 mmHg Patient Gender: M              HR:           97 bpm. Exam Location:  Inpatient Procedure: 2D Echo, Cardiac Doppler and Color Doppler Indications:    Stroke  History:        Patient has no prior history of Echocardiogram examinations.                 Stroke, Signs/Symptoms:Dyspnea; Risk Factors:Hypertension.  Sonographer:    Roseanna Rainbow RDCS Referring Phys: [place]  Sonographer Comments: Technically difficult study due to poor echo windows. Patient did not have IV access, and refused to get one. Definity not used. IMPRESSIONS  1. Left ventricular ejection fraction, by estimation, is 30 to 35%. The left ventricle has moderately decreased function. The left ventricle demonstrates global hypokinesis. The left ventricular internal cavity size was mildly dilated. There is severe concentric left ventricular hypertrophy. Left ventricular diastolic parameters are indeterminate.  2. Right ventricular systolic function is normal. The right ventricular size is normal.  3. Left atrial size was severely dilated.  4. The mitral valve is grossly normal. Mild mitral valve regurgitation.  5. The aortic valve is calcified. Aortic valve regurgitation is not visualized. Mild to moderate aortic valve stenosis. Aortic valve mean gradient measures 25.0 mmHg. Comparison(s): No prior Echocardiogram. Conclusion(s)/Recommendation(s): LV function is reduced. There is at least mild to moderate aortic stenosis; this may be underestimated in the setting of LV function. FINDINGS  Left Ventricle: LVMI 197 g/m2 RWT 0.54. Left ventricular ejection fraction, by estimation, is 30 to 35%. The left ventricle has moderately decreased function. The left ventricle demonstrates global hypokinesis. The left ventricular internal cavity size was mildly dilated.  There  is severe concentric left ventricular hypertrophy. Left ventricular diastolic parameters are indeterminate. Right Ventricle: The right ventricular size is normal. No increase in right ventricular wall thickness. Right ventricular systolic function is normal. Left Atrium: Left atrial size was severely dilated. Right Atrium: Right atrial size was normal in size. Pericardium: There is no evidence of pericardial effusion. Mitral Valve: The mitral valve is grossly normal. There is mild thickening of the mitral valve leaflet(s). Mild to moderate mitral annular calcification. Mild mitral valve regurgitation. Tricuspid Valve: The tricuspid valve is grossly normal. Tricuspid valve regurgitation is not demonstrated. Aortic Valve: LV SVI 38. The aortic valve is calcified. Aortic valve regurgitation is not visualized. Mild to moderate aortic stenosis is present. Aortic valve mean gradient measures 25.0 mmHg. Aortic valve peak gradient measures 39.1 mmHg. Aortic valve area, by VTI measures 1.32 cm. Pulmonic Valve: The pulmonic valve was grossly normal. Pulmonic valve regurgitation is not visualized. Aorta: The aortic root is normal in size and structure. Venous: The pulmonary veins were not well visualized. The inferior vena cava was not well visualized. IAS/Shunts: The atrial septum is grossly normal.  LEFT VENTRICLE PLAX 2D LVIDd:         5.90 cm      Diastology LVIDs:         5.30 cm      LV e' medial:    3.99 cm/s LV PW:         1.60 cm      LV E/e' medial:  20.7 LV IVS:        1.30 cm      LV e' lateral:   4.12 cm/s LVOT diam:     2.45 cm      LV E/e' lateral: 20.0 LV SV:         80 LV SV Index:   40 LVOT Area:     4.71 cm  LV Volumes (MOD) LV vol d, MOD A2C: 165.0 ml LV vol d, MOD A4C: 119.5 ml LV vol s, MOD A2C: 111.0 ml LV vol s, MOD A4C: 76.0 ml LV SV MOD A2C:     54.0 ml LV SV MOD A4C:     119.5 ml LV SV MOD BP:      48.8 ml RIGHT VENTRICLE             IVC RV S prime:     18.00 cm/s  IVC diam: 3.10 cm TAPSE  (M-mode): 2.3 cm LEFT ATRIUM             Index       RIGHT ATRIUM           Index LA diam:        4.20 cm 2.12 cm/m  RA Area:     12.20 cm LA Vol (A2C):   70.5 ml 35.54 ml/m RA Volume:   24.90 ml  12.55 ml/m LA Vol (A4C):   94.0 ml 47.38 ml/m LA Biplane Vol: 85.4 ml 43.05 ml/m  AORTIC VALVE AV Area (Vmax):    1.22 cm AV Area (Vmean):   1.08 cm AV Area (VTI):     1.32 cm AV Vmax:           312.75 cm/s AV Vmean:          236.500 cm/s AV VTI:            0.608 m AV Peak Grad:      39.1 mmHg AV Mean Grad:      25.0 mmHg  LVOT Vmax:         80.90 cm/s LVOT Vmean:        54.400 cm/s LVOT VTI:          0.170 m LVOT/AV VTI ratio: 0.28  AORTA Ao Root diam: 3.20 cm Ao Asc diam:  3.10 cm MITRAL VALVE MV Area (PHT): 4.97 cm    SHUNTS MV Decel Time: 153 msec    Systemic VTI:  0.17 m MV E velocity: 82.53 cm/s  Systemic Diam: 2.45 cm Rudean Haskell MD Electronically signed by Rudean Haskell MD Signature Date/Time: 02/21/2020/4:33:50 PM    Final     Labs:  Basic Metabolic Panel: Recent Labs  Lab 02/20/20 1243 02/20/20 1305 02/23/20 0549  NA 137 141 138  K 3.8 3.9 3.7  CL 103 103 101  CO2 26  --  26  GLUCOSE 104* 102* 111*  BUN 22 23 22   CREATININE 0.92 1.00 1.17  CALCIUM 8.9  --  8.9  MG  --   --  2.2    CBC: Recent Labs  Lab 02/20/20 1243 02/20/20 1305 02/23/20 0549  WBC 6.4  --  7.8  NEUTROABS 4.2  --  4.5  HGB 15.9 16.3 16.5  HCT 47.5 48.0 47.4  MCV 94.8  --  91.9  PLT 247  --  259    CBG: No results for input(s): GLUCAP in the last 168 hours.  Family history.  Mother with CVA and hypertension.  Denies any colon cancer esophageal cancer or rectal cancer  Brief HPI:   GEROD CALIGIURI is a 86 y.o. right-handed male with history of hypertension chronic back pain with history of lumbar decompression maintained on Celebrex as well as Neurontin with baclofen and hydrocodone as needed hyperlipidemia tobacco abuse.  Patient lives with spouse independent prior to admission and  still driving.  Presented 02/20/2020 with dysarthria and left-sided weakness.  CT of the head showed mild diffuse cortical atrophy.  Low-density noted in the region of the right cerebellar peduncle concerning for infarction of indeterminate age.  Patient did not receive TPA.  MRI showed area of abnormal signal within the right middle cerebellar peduncle.  CT angiogram of head and neck no emergent findings no evidence of cerebellar branch occlusion.  At least 70% left ICA origin stenosis.  Echocardiogram with ejection fraction of 30 to 35%.  Noted severe concentric left ventricular hypertrophy.  Currently maintained on aspirin Plavix for CVA prophylaxis x3 weeks and aspirin alone.  Tolerating a regular diet.  Therapy evaluations completed patient was admitted for a comprehensive rehab program.   Hospital Course: ZARIF RATHJE was admitted to rehab 02/22/2020 for inpatient therapies to consist of PT, ST and OT at least three hours five days a week. Past admission physiatrist, therapy team and rehab RN have worked together to provide customized collaborative inpatient rehab.  Pertaining to patient's right cerebellar infarction remained stable he would follow-up with neurology services.  He continued on aspirin Plavix x3 weeks and aspirin alone.  Chronic pain management for back pain Neurontin as advised with hydrocodone as needed.  Noted ejection fraction 30 to 35% during work-up of CVA cardiomyopathy which would be addressed as outpatient with cardiology services.  Permissive hypertension maintained on low-dose Norvasc, Toprol-XL 12.5 mg daily as well as Cozaar.  Mood stabilization with Celexa he was attending full therapies.  BPH Flomax as directed voiding without difficulty.  Hyperlipidemia Zetia and Pravachol as directed.  History of tobacco abuse received counsel regards to cessation of  nicotine products.   Blood pressures were monitored on TID basis and controlled      Rehab course: During patient's  stay in rehab weekly team conferences were held to monitor patient's progress, set goals and discuss barriers to discharge. At admission, patient required min mod assist ambulate 100 feet straight cane min assist sit to stand supervision sit to supine.  Minimal guard lower body dressing minimal guard toilet transfers  Physical exam.  Blood pressure 152/86 pulse 88 temperature 97.3 respirations 17 oxygen saturation 91% room air Constitutional.  No acute distress HEENT Head.  Normocephalic and atraumatic Eyes.  Pupils round and reactive to light no discharge without nystagmus Neck.  Supple nontender no JVD without thyromegaly Cardiac regular rate rhythm without extra sounds or murmur heard Abdomen.  Soft nontender positive bowel sounds without rebound Respiratory effort normal no respiratory distress without wheeze Skin.  Sun spots on upper extremities greater than lower extremities no skin breakdown Neurologic alert no acute distress oriented x3 follows commands moves all extremities.  He/  has had improvement in activity tolerance, balance, postural control as well as ability to compensate for deficits. He/ has had improvement in functional use RUE/LUE  and RLE/LLE as well as improvement in awareness.  Patient perform supine to sit modified independent.  Ambulates contact-guard assist rolling walker.  Patient performed a floor transfer to simulate prior home exercise routine contact-guard assist.  He can increase his ambulation up to 100 feet x 2.  Patient completed bathing dressing task overall supervision level.  Full family teaching completed plan discharged home       Disposition: Discharged home    Diet: Regular  Special Instructions: No driving smoking or alcohol  Medications at discharge 1.  Tylenol as needed 2.  Norvasc 2.5 mg daily 3.  Aspirin 81 mg daily 4.  Celexa 10 mg daily 5.  Plavix and 5 mg p.o. daily x14 days 6.  Zetia 10 mg every evening 7.  Neurontin 600 mg 3  times daily 8.  Hydrocodone 7.5-325 mg 1 tablet every 8 hours as needed pain 9.  Cozaar 12.5 mg daily 10.  Toprol-XL 12.5 mg daily 11.  Protonix 40 mg p.o. daily 12.  Pravachol 20 mg daily 13.  Senokot S1 tablet p.o. twice daily 14.  Flomax 0.4 mg p.o. daily 15 Baclofen 10 mg TID 16 Ativan 1 mg every 8 hour as needed anxiety  30-35 minutes were spent completing discharge summary and discharge planning  Discharge Instructions    Ambulatory referral to Neurology   Complete by: As directed    An appointment is requested in approximately 4 weeks right cerebellar peduncle CVA secondary small vessel disease   Ambulatory referral to Occupational Therapy   Complete by: As directed    Eval and treat   Ambulatory referral to Physical Medicine Rehab   Complete by: As directed    Moderate complexity follow up 1-2 weeks right cerebellar peduncle  infarction   Ambulatory referral to Physical Therapy   Complete by: As directed    Eval and treat       Follow-up Information    Raulkar, Clide Deutscher, MD Follow up.   Specialty: Physical Medicine and Rehabilitation Why: Office to call for appointment Contact information: [address] [place] [address] [phone]        Dorothy Spark, MD Follow up.   Specialty: Cardiology Why: we will arrange a follow up appt in near future  Contact information: [address] [place] [address] [phone]               Signed: Lavon Paganini Marcey Persad 02/27/2020, 5:20 AM

## 2020-02-26 NOTE — Progress Notes (Signed)
Occupational Therapy Discharge Summary  Patient Details  Name: Ruben Barrera MRN: [id number] Date of Birth: [birth year]  Today's Date: 02/27/2020 OT Individual Time: 4825-0037 OT Individual Time Calculation (min): 49 min   Patient seated in recliner, he denies pain at this time but notes ongoing issues with his back.  Wife present for family education session.  Patient states that he was able to wash and dress himself already this morning.  Discussed safety with adl completion, use of grab bars and DME in bathroom setting.  He demonstrated his ability to complete functional transfers at a supervision level to/from toilet, shower seat, arm chair, mat table.  Reviewed safety with turning and uneven surfaces, avoiding distractions, energy conservation, use of gait belt.  He ambulated with RW to/from therapy gym with CS.  Completed coordination assessments:  Box and blocks:  R = 42, L = 48.   9 hole peg:  R = 33 sec, L = 33 sec. Returned to room, seated in recliner.  Patient and wife express and demonstrate readiness for discharge and understanding of information provided.   Patient has met 11 of 11 long term goals due to improved activity tolerance, improved balance, postural control, ability to compensate for deficits and improved awareness.  Patient to discharge at overall Supervision level.  Patient's care partner is independent to provide the necessary physical assistance at discharge.    Reasons goals not met: n/a  Recommendation:  Patient will benefit from ongoing skilled OT services in outpatient setting to continue to advance functional skills in the area of BADL.  Equipment: RW  Reasons for discharge: treatment goals met and discharge from hospital  Patient/family agrees with progress made and goals achieved: Yes  OT Discharge Precautions/Restrictions  Precautions Precautions: Fall Restrictions Weight Bearing Restrictions: No General   Pain Pain Assessment Pain Scale:  0-10 Pain Score: Asleep ADL ADL Eating: Independent Grooming: Independent Upper Body Bathing: Independent Lower Body Bathing: Supervision/safety Upper Body Dressing: Modified independent (Device) Lower Body Dressing: Supervision/safety Toileting: Supervision/safety Toilet Transfer: Close supervision Social research officer, government: Close supervision Cognition Overall Cognitive Status: Within Functional Limits for tasks assessed Arousal/Alertness: Awake/alert Orientation Level: Oriented X4 Problem Solving: Appears intact Safety/Judgment: Impaired Sensation Sensation Light Touch: Appears Intact Hot/Cold: Appears Intact Proprioception: Appears Intact Mobility  Transfers Sit to Stand: Supervision/Verbal cueing Stand to Sit: Supervision/Verbal cueing  Balance Static Sitting Balance Static Sitting - Level of Assistance: 7: Independent Dynamic Sitting Balance Dynamic Sitting - Balance Support: During functional activity Dynamic Sitting - Level of Assistance: 7: Independent Static Standing Balance Static Standing - Balance Support: During functional activity Static Standing - Level of Assistance: 5: Stand by assistance Dynamic Standing Balance Dynamic Standing - Balance Support: During functional activity Dynamic Standing - Level of Assistance: 5: Stand by assistance Extremity/Trunk Assessment RUE Assessment RUE Assessment: Within Functional Limits LUE Assessment LUE Assessment: Within Functional Limits   Daneen Schick Doe 02/26/2020, 10:27 AM

## 2020-02-26 NOTE — Progress Notes (Signed)
Occupational Therapy Session Note  Patient Details  Name: Ruben Barrera MRN: [id number] Date of Birth: 06/10/1932  Today's Date: 02/26/2020 OT Individual Time: 1300-1411 OT Individual Time Calculation (min): 71 min   Short Term Goals: Week 1:  OT Short Term Goal 1 (Week 1): LTG=STG 2/2 ELOS  Skilled Therapeutic Interventions/Progress Updates:    Pt greeted in bed with no c/o pain. ADL needs met. He was agreeable to work on higher level balance by engaging in IADL activity in the community setting of the hospital. He used the RW at [employer] I level while navigating on/off elevators, window shopping in the gift shop, and purchasing 2 cups of coffee at [employer]. While he drank he cup of coffee, we discussed CVA prevention strategies and his personal risk factors (smoking cigars, stress, hypertension). Education provided on smoking cessation, holistic stress mgt, and BP checks during his daily routine. Pt reports he has a BP monitor on his watch and would manually check his BP at home as well. He also reported that he has his own exercise routine using weights for UEs and bands for LEs and plans to return to his exercises post d/c. Discussed how daily exercise can decrease his risk of CVA. He then returned to his room using RW, able to pathfind with min cuing, completed an ambulatory toilet transfer at Mod I level. Once he was finished with toileting, pt returned to bed and was left with all needs within reach and bed alarm set.   Therapy Documentation Precautions:  Precautions Precautions: Fall Restrictions Weight Bearing Restrictions: No ADL: ADL Eating: Independent Grooming: Independent Upper Body Bathing: Independent Lower Body Bathing: Supervision/safety Upper Body Dressing: Modified independent (Device) Lower Body Dressing: Supervision/safety Toileting: Supervision/safety Toilet Transfer: Close supervision Walk-In Shower Transfer: Close supervision      Therapy/Group:  Individual Therapy  Brenn Gatton A Jerusalem Brownstein 02/26/2020, 4:09 PM

## 2020-02-26 NOTE — Progress Notes (Addendum)
Patient ID: Ruben Barrera, male   DOB: 12/07/1932, 86 y.o.   MRN: [id number]  Per medical team, pt has progressed well in which pt can d/c tomorrow, with family edu tomorrow (12/7) 10am-12pm and then pt can d/c. SW spoke with pt wife [name] [phone]) to discuss above. She confirms she is agreeable to pt d/c to home tomorrow, and will be here for family edu in the morning. SW discussed d/c recs: outpatient PT/OT and RW. SW discussed being aware pt has RW. Wife would like for RW to be ordered for him. SW met with pt in room to discuss above.   SW faxed outpatient PT/OT referral to [facility] (p:[phone]/f:[phone]). SW ordered DME: RW with [employer] [facility] via parachute.   Loralee Pacas, MSW, [place] Office: [phone] Cell: [phone] Fax: [phone]

## 2020-02-26 NOTE — Progress Notes (Signed)
PHYSICAL MEDICINE & REHABILITATION PROGRESS NOTE   Subjective/Complaints: Patient seen ambulating with therapy this morning, and later sitting.  He states he slept fairly overnight due to being in the hospital.  He notes chronic back pain, which is improved with medications.  ROS: + Chronic back pain.  Denies CP, SOB, N/V/D  Objective:   No results found. No results for input(s): WBC, HGB, HCT, PLT in the last 72 hours. No results for input(s): NA, K, CL, CO2, GLUCOSE, BUN, CREATININE, CALCIUM in the last 72 hours.  Intake/Output Summary (Last 24 hours) at 02/26/2020 1108 Last data filed at 02/25/2020 1828 Gross per 24 hour  Intake 400 ml  Output --  Net 400 ml        Physical Exam: Vital Signs Blood pressure (!) 147/83, pulse 88, temperature 97.6 F (36.4 C), resp. rate 20, height 5\' 7"  (1.702 m), weight 82.1 kg, SpO2 90 %. Constitutional: No distress . Vital signs reviewed. HENT: Normocephalic.  Atraumatic. Eyes: EOMI. No discharge. Cardiovascular: No JVD.  RRR. Respiratory: Normal effort.  No stridor.  Bilateral clear to auscultation. GI: Non-distended.  BS +. Skin: Warm and dry.  Intact. Psych: Normal mood.  Normal behavior. Musc: No edema in extremities.  No tenderness in extremities. Neuro: Alert Dysarthria.  Motor: Grossly 5/5 throughout  Assessment/Plan: 1. Functional deficits which require 3+ hours per day of interdisciplinary therapy in a comprehensive inpatient rehab setting.  Physiatrist is providing close team supervision and 24 hour management of active medical problems listed below.  Physiatrist and rehab team continue to assess barriers to discharge/monitor patient progress toward functional and medical goals  Care Tool:  Bathing    Body parts bathed by patient: Right arm, Chest, Left arm, Front perineal area, Abdomen, Left upper leg, Right lower leg, Left lower leg, Face, Buttocks, Right upper leg         Bathing assist Assist Level:  Supervision/Verbal cueing     Upper Body Dressing/Undressing Upper body dressing   What is the patient wearing?: Pull over shirt    Upper body assist Assist Level: Supervision/Verbal cueing    Lower Body Dressing/Undressing Lower body dressing      What is the patient wearing?: Pants, Underwear/pull up     Lower body assist Assist for lower body dressing: Supervision/Verbal cueing     Toileting Toileting    Toileting assist Assist for toileting: Supervision/Verbal cueing     Transfers Chair/bed transfer  Transfers assist     Chair/bed transfer assist level: Supervision/Verbal cueing     Locomotion Ambulation   Ambulation assist      Assist level: Minimal Assistance - Patient > 75% Assistive device: Cane-straight Max distance: 150   Walk 10 feet activity   Assist     Assist level: Minimal Assistance - Patient > 75% Assistive device: Walker-rolling   Walk 50 feet activity   Assist    Assist level: Minimal Assistance - Patient > 75% Assistive device: Cane-straight    Walk 150 feet activity   Assist    Assist level: Minimal Assistance - Patient > 75% Assistive device: Cane-straight    Walk 10 feet on uneven surface  activity   Assist     Assist level: Minimal Assistance - Patient > 75% Assistive device: Hospital doctor     Assist Will patient use wheelchair at discharge?: No             Wheelchair 50 feet with 2 turns activity    Assist  Wheelchair 150 feet activity     Assist          Medical Problem List and Plan: 1.  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 Antithrombotics: -DVT/anticoagulation:SCDs -antiplatelet therapy: Aspirin 81 mg daily and Plavix 75 mg daily x3  weeks then aspirin alone 3. Pain Management/chronic back pain:Neurontin 600 mg 3 times daily, hydrocodone as needed  Controlled with meds on 12/6 4. Mood:Ativan 1 mg every 8 hours as needed anxiety provide emotional support -antipsychotic agents: N/A 5. Neuropsych: This patientiscapable of making decisions on hisown behalf. 6. Skin/Wound Care:Routine skin checks 7. Fluids/Electrolytes/Nutrition:Routine I's/Os 8. Cardiomyopathy. Ejection fraction 30 to 35%. Follow-up cardiology services Filed Weights   02/22/20 1524 02/22/20 1548  Weight: 82.1 kg 82.1 kg    Weight stable on 12/2, no repeat weights since 9. Carotid stenosis. Left ICA origin 70% stenosis. Recommend vascular outpatient follow-up 10.  Essential hypertension. Patient on Norvasc 2.5 mg daily prior to admission.    -resumed low dose norvasc on 12/4  Controlled on 12/6 11. BPH. Flomax 0.4 mg daily.  12. GERD. Protonix 13. Hyperlipidemia: Zetia/Pravachol 14.Tobacco abuse.Counseling 15. Word finding issues- pt specifically asking for SLP to work on this issue.  LOS: 4 days A FACE TO FACE EVALUATION WAS PERFORMED  Burlon Centrella Lorie Phenix 02/26/2020, 11:08 AM

## 2020-02-26 NOTE — Discharge Instructions (Signed)
Inpatient Rehab Discharge Instructions  JAPHET MORGENTHALER Discharge date and time: No discharge date for patient encounter.   Activities/Precautions/ Functional Status: Activity: activity as tolerated Diet: regular diet Wound Care: Routine skin checks Functional status:  ___ No restrictions     ___ Walk up steps independently ___ 24/7 supervision/assistance   ___ Walk up steps with assistance ___ Intermittent supervision/assistance  ___ Bathe/dress independently ___ Walk with walker     _x__ Bathe/dress with assistance ___ Walk Independently    ___ Shower independently ___ Walk with assistance    ___ Shower with assistance ___ No alcohol     ___ Return to work/school ________  COMMUNITY REFERRALS UPON DISCHARGE:    Outpatient: PT     OT             Agency: [facility]     Phone: [phone]             Appointment Date/Time:*Please expect follow-up within 7-10 business days to schedule your home visit. If you have not received follow-up, be sure to contact the site directly.*  Medical Equipment/Items Ordered: rolling walker                                                 Agency/Supplier: [facility] [phone]    Special Instructions: No driving smoking or alcohol  Plan is for aspirin and Plavix x3 weeks total STROKE/TIA DISCHARGE INSTRUCTIONS SMOKING Cigarette smoking nearly doubles your risk of having a stroke & is the single most alterable risk factor  If you smoke or have smoked in the last 12 months, you are advised to quit smoking for your health.  Most of the excess cardiovascular risk related to smoking disappears within a year of stopping.  Ask you doctor about anti-smoking medications  [place] Quit Line: 1-800-QUIT NOW  Free Smoking Cessation Classes [phone]  CHOLESTEROL Know your levels; limit fat & cholesterol in your diet  Lipid Panel     Component Value Date/Time   CHOL 204 (H) 02/21/2020 0253   TRIG 199 (H) 02/21/2020 0253   HDL 42 02/21/2020  0253   CHOLHDL 4.9 02/21/2020 0253   VLDL 40 02/21/2020 0253   LDLCALC 122 (H) 02/21/2020 0253      Many patients benefit from treatment even if their cholesterol is at goal.  Goal: Total Cholesterol (CHOL) less than 160  Goal:  Triglycerides (TRIG) less than 150  Goal:  HDL greater than 40  Goal:  LDL (LDLCALC) less than 100   BLOOD PRESSURE American Stroke Association blood pressure target is less that 120/80 mm/Hg  Your discharge blood pressure is:     Monitor your blood pressure  Limit your salt and alcohol intake  Many individuals will require more than one medication for high blood pressure  DIABETES (A1c is a blood sugar average for last 3 months) Goal HGBA1c is under 7% (HBGA1c is blood sugar average for last 3 months)  Diabetes: No known diagnosis of diabetes    Lab Results  Component Value Date   HGBA1C 5.6 02/21/2020     Your HGBA1c can be lowered with medications, healthy diet, and exercise.  Check your blood sugar as directed by your physician  Call your physician if you experience unexplained or low blood sugars.  PHYSICAL ACTIVITY/REHABILITATION Goal is 30 minutes at least 4 days per week  Activity: Increase activity slowly, Therapies: Physical Therapy: Home Health Return to work:   Activity decreases your risk of heart attack and stroke and makes your heart stronger.  It helps control your weight and blood pressure; helps you relax and can improve your mood.  Participate in a regular exercise program.  Talk with your doctor about the best form of exercise for you (dancing, walking, swimming, cycling).  DIET/WEIGHT Goal is to maintain a healthy weight  Your discharge diet is:  Diet Order    None      liquids Your height is:    Your current weight is:   Your Body Mass Index (BMI) is:     Following the type of diet specifically designed for you will help prevent another stroke.  Your goal weight range is:    Your goal Body Mass Index (BMI) is  19-24.  Healthy food habits can help reduce 3 risk factors for stroke:  High cholesterol, hypertension, and excess weight.  RESOURCES Stroke/Support Group:  Call [phone]   STROKE EDUCATION PROVIDED/REVIEWED AND GIVEN TO PATIENT Stroke warning signs and symptoms How to activate emergency medical system (call 911). Medications prescribed at discharge. Need for follow-up after discharge. Personal risk factors for stroke. Pneumonia vaccine given:  Flu vaccine given:  My questions have been answered, the writing is legible, and I understand these instructions.  I will adhere to these goals & educational materials that have been provided to me after my discharge from the hospital.    then aspirin alone   My questions have been answered and I understand these instructions. I will adhere to these goals and the provided educational materials after my discharge from the hospital.  Patient/Caregiver Signature _______________________________ Date __________  Clinician Signature _______________________________________ Date __________  Please bring this form and your medication list with you to all your follow-up doctor's appointments.     You will receive a monitor to wear prob in the mail call Dr. Francesca Oman office with any questions.

## 2020-02-26 NOTE — Progress Notes (Signed)
Physical Therapy Session Note  Patient Details  Name: ONEILL BAIS MRN: [id number] Date of Birth: 05/22/1932  Today's Date: 02/26/2020 PT Individual Time: 5374-8270 PT Individual Time Calculation (min): 71 min   Short Term Goals: Week 1:  PT Short Term Goal 1 (Week 1): STG=LTG DUE TO LOS  Skilled Therapeutic Interventions/Progress Updates:     PT received seated in recliner and agrees to therapy. No complaint of pain other than chronic low back pain. PT provides mobility, repositioning , and rest breaks to manage pain. Pt performs sit to stand multiple times during session with verbal cues for proper hand placement for improved safety. Pt ambulates x200' with RW and cues to increase gait speed and increased upright gaze to improve posture and safety. Pt completes x4 steps utilizing bilatreal hand rails and CGA, then additional x4 steps with Left handrail and CGA.  Pt completes Berg Balance assessment and scores 44/56, indicating that he is still at high risk for falls, but demonstrating improvement from previous test completion. Pt ambulates 200' back to room with cues to increase gait speed to decrease risk for falls. Left seated in recliner with alarm intact and all needs within reach.  Therapy Documentation Precautions:  Precautions Precautions: Fall Restrictions Weight Bearing Restrictions: No   Therapy/Group: Individual Therapy  Breck Coons, PT, DPT 02/26/2020, 4:01 PM

## 2020-02-26 NOTE — Progress Notes (Signed)
Occupational Therapy Session Note  Patient Details  Name: Ruben Barrera MRN: [id number] Date of Birth: 1932-10-01  Today's Date: 02/26/2020 OT Individual Time: 9704-4925 OT Individual Time Calculation (min): 57 min    Short Term Goals: Week 1:  OT Short Term Goal 1 (Week 1): LTG=STG 2/2 ELOS  Skilled Therapeutic Interventions/Progress Updates:    Patient greeted semi-reclined in bed and agreeable to OT treatment session. Pt completed bathing/dressing tasks at overall supervision level. Pt needed min verbal cues for safe use of RW while ambulating during BADL session. Pt then ambulated to therapy gym w/ RW and close supervision. OT issued UE HEP and completed 5 reps of each exercise using green theraband. Pt then ambulated in hallway with RW and worked on safety with turning. Verbal cues to slow down and take his time to reduce risk of falls. Pt returned to room and left seated in recliner with chair alarm on, call bell in reach, and needs met.    Therapy Documentation Precautions:  Precautions Precautions: Fall Restrictions Weight Bearing Restrictions: No Pain: Patient reports chronic back pain that is controlled with pain meds he already received.   Therapy/Group: Individual Therapy  Valma Cava 02/26/2020, 7:52 AM

## 2020-02-27 ENCOUNTER — Encounter (HOSPITAL_COMMUNITY): Payer: PPO | Admitting: Occupational Therapy

## 2020-02-27 ENCOUNTER — Ambulatory Visit (HOSPITAL_COMMUNITY): Payer: PPO

## 2020-02-27 DIAGNOSIS — R471 Dysarthria and anarthria: Secondary | ICD-10-CM

## 2020-02-27 NOTE — Patient Care Conference (Signed)
Inpatient RehabilitationTeam Conference and Plan of Care Update Date: 02/27/2020   Time: 10:59 AM    Patient Name: Ruben Barrera      Medical Record Number: [id number]  Date of Birth: Sep 25, 1932 Sex: Male         Room/Bed: 4W15C/4W15C-01 Payor Info: Payor: [employer] / Plan: [employer] PPO / Product Type: *No Product type* /    Admit Date/Time:  02/22/2020  2:41 PM  Primary Diagnosis:  Acute stroke due to occlusion of right cerebellar artery [facility])  Hospital Problems: Principal Problem:   Acute stroke due to occlusion of right cerebellar artery ([place]) Active Problems:   Dyslipidemia   Chronic systolic congestive heart failure [facility])    Expected Discharge Date: Expected Discharge Date: 02/27/20  Team Members Present: Physician leading conference: Dr. Alger Simons Care Coodinator Present: Loralee Pacas, LCSWA;Enzley Kitchens Creig Hines, RN, BSN, CRRN Nurse Present: Judee Clara, LPN PT Present: Tereasa Coop, PT OT Present: Elisabeth Most, OT SLP Present: Weston Anna, SLP PPS Coordinator present : Gunnar Fusi, SLP     Current Status/Progress Goal Weekly Team Focus  Bowel/Bladder   Pt continent of B/B LBM 02/26/2020  continued continent of B/B  Assess B/B every shift and PRN   Swallow/Nutrition/ Hydration             ADL's   Mod I/supervision  Mod I/supervision  dc planning, balance, actviity tolerance, pt.family education   Mobility   supervision bed mobility, transfers, ambulation with RW >200', CGA 3 steps iwth LHR  mod(I)  DC, balance, family ed   Communication             Safety/Cognition/ Behavioral Observations            Pain   Pt with chronic back pain relieved with PRN Hydrocodone  Pain scale of <3  Assess pain scale every shift and PRN   Skin   Skin intact  no new breakdown  Assess skin every shift and PRN     Discharge Planning:  Pt to d/c to home with his wife who will provide 24/7 care.   Team Discussion: Patient is continent of B/B,  chronic back pain relieved with PRN Hydrocodone, skin is CDI. OT reports patient is Mod I to supervision. Pt reports patient is supervision for bed mobility, transfers, and ambulation with RW > 200'. Contact guard assist with left hand rail for 3 steps. Goals are Mod I.  Patient on target to meet rehab goals: yes  *See Care Plan and progress notes for long and short-term goals.   Revisions to Treatment Plan:  None  Teaching Needs: Family education is complete.  Current Barriers to Discharge: Medically stable for discharge.  Possible Resolutions to Barriers: Patient is ready for discharge.     Medical Summary Current Status: admitted to rehab after right cerebellar peduncle infarct. truncal ataxia, mild dysarthria. BP under reasonable control.     Possible Resolutions to [employer] Focus: no medical barriers to discharge   Continued Need for Acute Rehabilitation Level of Care: The patient requires daily medical management by a physician with specialized training in physical medicine and rehabilitation for the following reasons: Direction of a multidisciplinary physical rehabilitation program to maximize functional independence : Yes Medical management of patient stability for increased activity during participation in an intensive rehabilitation regime.: Yes Analysis of laboratory values and/or radiology reports with any subsequent need for medication adjustment and/or medical intervention. : Yes   I attest that I was present, lead the team conference, and  concur with the assessment and plan of the team.   Cristi Loron 02/27/2020, 12:27 PM

## 2020-02-27 NOTE — Progress Notes (Signed)
Inpatient Rehabilitation Care Coordinator  Discharge Note  The overall goal for the admission was met for:   Discharge location: Yes. D/c to home with 24/7 care from wife.   Length of Stay: Yes. 4 days.   Discharge activity level: Yes. Supervision.  Home/community participation: Yes.  Services provided included: MD, RD, PT, OT, RN, CM, TR, Pharmacy, Neuropsych and SW  Financial Services: Private Insurance: [employer]\  Follow-up services arranged: Outpatient: [facility] for outpatient PT/OT and DME: [employer] [facility] for RW  Comments (or additional information): contact pt wife [name] [phone]  Patient/Family verbalized understanding of follow-up arrangements: Yes  Individual responsible for coordination of the follow-up plan: Pt to have assistance with coordinating care needs.   Confirmed correct DME delivered: Rana Snare 02/27/2020    Loralee Pacas, MSW, [place] Office: [phone] Cell: [phone] Fax: [phone]

## 2020-02-27 NOTE — Progress Notes (Signed)
Patient did not use call bell or assistance when getting up. Remind patient to call for assistance. Patient now in recliner resting, awaiting therapy session. Patient alert and oriented x4. No complications noted at this time. Wife at bedside and call bell within reach.  Audie Clear, LPN

## 2020-02-27 NOTE — Progress Notes (Signed)
Patient discharged home with family, all belongings sent with patient. No complications noted at this time. Audie Clear, LPN

## 2020-02-27 NOTE — Progress Notes (Signed)
[facility] PHYSICAL MEDICINE & REHABILITATION PROGRESS NOTE   Subjective/Complaints: Up in gym, wife present. No new complaints. Excited to get home  ROS: Patient denies fever, rash, sore throat, blurred vision, nausea, vomiting, diarrhea, cough, shortness of breath or chest pain, joint or back pain, headache, or mood change.    Objective:   No results found. No results for input(s): WBC, HGB, HCT, PLT in the last 72 hours. No results for input(s): NA, K, CL, CO2, GLUCOSE, BUN, CREATININE, CALCIUM in the last 72 hours.  Intake/Output Summary (Last 24 hours) at 02/27/2020 0918 Last data filed at 02/27/2020 0754 Gross per 24 hour  Intake 650 ml  Output --  Net 650 ml        Physical Exam: Vital Signs Blood pressure (!) 144/89, pulse 72, temperature (!) 97.4 F (36.3 C), resp. rate 19, height 5\' 7"  (1.702 m), weight 82.1 kg, SpO2 95 %. Constitutional: No distress . Vital signs reviewed. HEENT: EOMI, oral membranes moist Neck: supple Cardiovascular: RRR without murmur. No JVD    Respiratory/Chest: CTA Bilaterally without wheezes or rales. Normal effort    GI/Abdomen: BS +, non-tender, non-distended Ext: no clubbing, cyanosis, or edema Psych: pleasant and cooperative Musc: No edema in extremities.  No tenderness in extremities. Neuro: Alert. Mild dysarthria.  Motor: Grossly 5/5 throughout. Right truncal ataxia Good insight and awareness.  Assessment/Plan: 1. Functional deficits which require 3+ hours per day of interdisciplinary therapy in a comprehensive inpatient rehab setting.  Physiatrist is providing close team supervision and 24 hour management of active medical problems listed below.  Physiatrist and rehab team continue to assess barriers to discharge/monitor patient progress toward functional and medical goals  Care Tool:  Bathing    Body parts bathed by patient: Right arm, Chest, Left arm, Front perineal area, Abdomen, Left upper leg, Right lower leg, Left lower  leg, Face, Buttocks, Right upper leg         Bathing assist Assist Level: Supervision/Verbal cueing     Upper Body Dressing/Undressing Upper body dressing   What is the patient wearing?: Pull over shirt    Upper body assist Assist Level: Supervision/Verbal cueing    Lower Body Dressing/Undressing Lower body dressing      What is the patient wearing?: Pants, Underwear/pull up     Lower body assist Assist for lower body dressing: Supervision/Verbal cueing     Toileting Toileting    Toileting assist Assist for toileting: Independent with assistive device     Transfers Chair/bed transfer  Transfers assist     Chair/bed transfer assist level: Supervision/Verbal cueing     Locomotion Ambulation   Ambulation assist      Assist level: Supervision/Verbal cueing Assistive device: Walker-rolling Max distance: 200'   Walk 10 feet activity   Assist     Assist level: Supervision/Verbal cueing Assistive device: Walker-rolling   Walk 50 feet activity   Assist    Assist level: Supervision/Verbal cueing Assistive device: Cane-straight    Walk 150 feet activity   Assist    Assist level: Supervision/Verbal cueing Assistive device: Walker-rolling    Walk 10 feet on uneven surface  activity   Assist     Assist level: Minimal Assistance - Patient > 75% Assistive device: Hospital doctor     Assist Will patient use wheelchair at discharge?: No             Wheelchair 50 feet with 2 turns activity    Assist  Wheelchair 150 feet activity     Assist          Medical Problem List and Plan: 1.  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  Needs neuro, PCP f/u as well 2.  Antithrombotics: -DVT/anticoagulation:SCDs -antiplatelet therapy: Aspirin 81 mg daily and Plavix 75 mg daily x3 weeks then aspirin alone 3. Pain Management/chronic back pain:Neurontin 600 mg 3 times daily, hydrocodone as needed  Controlled with meds on 12/6 4. Mood:Ativan 1 mg every 8 hours as needed anxiety provide emotional support -antipsychotic agents: N/A 5. Neuropsych: This patientiscapable of making decisions on hisown behalf. 6. Skin/Wound Care:Routine skin checks 7. Fluids/Electrolytes/Nutrition:Routine I's/Os 8. Cardiomyopathy. Ejection fraction 30 to 35%. Follow-up cardiology services Filed Weights   02/22/20 1524 02/22/20 1548  Weight: 82.1 kg 82.1 kg    Weight stable on 12/2, no repeat weights since  -appears euvolemic  +2577 since admit 9. Carotid stenosis. Left ICA origin 70% stenosis. Recommend vascular outpatient follow-up 10.  Essential hypertension. Patient on Norvasc 2.5 mg daily prior to admission.    -resumed low dose norvasc on 12/4  Controlled on 12/7 11. BPH. Flomax 0.4 mg daily.  12. GERD. Protonix 13. Hyperlipidemia: Zetia/Pravachol 14.Tobacco abuse.Counseling    LOS: 5 days A FACE TO FACE EVALUATION WAS PERFORMED  Meredith Staggers 02/27/2020, 9:18 AM

## 2020-02-27 NOTE — Progress Notes (Signed)
Physical Therapy Discharge Summary  Patient Details  Name: Ruben Barrera MRN: [id number] Date of Birth: 12-25-1932  Today's Date: 02/27/2020 PT Individual Time: 4098-1191 PT Individual Time Calculation (min): 42 min    Patient has met 7 of 9 long term goals due to improved activity tolerance, improved balance, increased strength and improved attention.  Patient to discharge at an ambulatory level Independent.   Patient's care partner is independent to provide the necessary physical assistance at discharge.  Reasons goals not met: Pt wife to provide CGA for stair transfers and pt DC'd prior to attempting floor transfer.   Recommendation:  Patient will benefit from ongoing skilled PT services in outpatient setting to continue to advance safe functional mobility, address ongoing impairments in strength, balance, ambulation, and minimize fall risk.  Equipment: RW  Reasons for discharge: Treatment goals met and DC from [facility] agrees with progress made and goals achieved: Yes   Skilled Therapeutic Interventions: Pt received seated in recliner with wife present for family education. Pt agreeable to therapy and reports slight chronic back pain. PT provides education on mobility, repositioning, and rest breaks to manage pain. Sit to stand with RW and mod(I). Transfer to [facility] via stand step technique with RW at mod(I). Pt transported to gym for time management. Pt performs car transfer with supervision and cues on sequencing and safety. Pt then performs ramp navigation and ambulation 200' with RW at [facility]). Pt performs bed mobility independently. Pt completes x4 steps with L hand rail and PT providing CGA, then additional 8 steps with wife providing CGA. PT cues on positoining and hand placement for safety. Pt ambulates additional 150' with RW and wife guarding pt. Pt left seated in recliner with all needs within reach.  PT  Discharge Precautions/Restrictions Precautions Precautions: Fall Vision/Perception  Perception Perception: Within Functional Limits Praxis Praxis: Intact  Cognition Overall Cognitive Status: Within Functional Limits for tasks assessed Arousal/Alertness: Awake/alert Safety/Judgment: Appears intact Sensation Sensation Light Touch: Appears Intact Hot/Cold: Appears Intact Proprioception: Appears Intact Coordination Gross Motor Movements are Fluid and Coordinated: Yes Fine Motor Movements are Fluid and Coordinated: Yes Motor  Motor Motor: Within Functional Limits  Mobility Bed Mobility Bed Mobility: Rolling Right;Rolling Left;Supine to Sit;Sit to Supine Rolling Right: Independent Rolling Left: Independent Right Sidelying to Sit: Independent Supine to Sit: Independent Sit to Supine: Independent Transfers Transfers: Sit to Stand;Stand to Sit;Stand Pivot Transfers Sit to Stand: Independent with assistive device Stand to Sit: Independent with assistive device Stand Pivot Transfers: Independent with assistive device Transfer (Assistive device): Rolling walker Locomotion  Gait Ambulation: Yes Gait Assistance: Independent with assistive device Gait Distance (Feet): 200 Feet Assistive device: Rolling walker Gait Gait: Yes Gait Pattern: Impaired Gait Pattern: Decreased step length - right Gait velocity: decreased Stairs / Additional Locomotion Stairs: Yes Stairs Assistance: Contact Guard/Touching assist Stair Management Technique: One rail Right Number of Stairs: 12 Height of Stairs: 6 Ramp: Independent with assistive device Curb: Nurse, mental health Mobility: No  Trunk/Postural Assessment  Cervical Assessment Cervical Assessment: Within Functional Limits Thoracic Assessment Thoracic Assessment: Within Functional Limits Lumbar Assessment Lumbar Assessment: Within Functional Limits Postural Control Postural Control: Deficits  on evaluation Righting Reactions: delayed but imrpoved from eval Protective Responses: delayed but improved from eval  Balance Balance Balance Assessed: Yes Static Sitting Balance Static Sitting - Balance Support: Feet supported Static Sitting - Level of Assistance: 7: Independent Dynamic Sitting Balance Dynamic Sitting - Balance Support: During functional activity Dynamic Sitting - Level of Assistance:  7: Independent Static Standing Balance Static Standing - Balance Support: During functional activity Static Standing - Level of Assistance: 6: Modified independent (Device/Increase time) Dynamic Standing Balance Dynamic Standing - Balance Support: Bilateral upper extremity supported Dynamic Standing - Level of Assistance: 6: Modified independent (Device/Increase time) Extremity Assessment  RLE Assessment General Strength Comments: Grossly 4+/5 LLE Assessment LLE Assessment: Within Functional Limits    Breck Coons, PT, DPT 02/27/2020, 4:02 PM

## 2020-02-29 ENCOUNTER — Telehealth: Payer: Self-pay | Admitting: Registered Nurse

## 2020-02-29 NOTE — Telephone Encounter (Signed)
TC call placed, no answer. Left message to return the call.

## 2020-03-01 ENCOUNTER — Ambulatory Visit (INDEPENDENT_AMBULATORY_CARE_PROVIDER_SITE_OTHER): Payer: PPO

## 2020-03-01 DIAGNOSIS — I493 Ventricular premature depolarization: Secondary | ICD-10-CM | POA: Diagnosis not present

## 2020-03-01 DIAGNOSIS — I639 Cerebral infarction, unspecified: Secondary | ICD-10-CM

## 2020-03-01 DIAGNOSIS — I429 Cardiomyopathy, unspecified: Secondary | ICD-10-CM | POA: Diagnosis not present

## 2020-03-02 DIAGNOSIS — I429 Cardiomyopathy, unspecified: Secondary | ICD-10-CM | POA: Diagnosis not present

## 2020-03-02 DIAGNOSIS — I4891 Unspecified atrial fibrillation: Secondary | ICD-10-CM | POA: Diagnosis not present

## 2020-03-02 DIAGNOSIS — I493 Ventricular premature depolarization: Secondary | ICD-10-CM | POA: Diagnosis not present

## 2020-03-02 DIAGNOSIS — I639 Cerebral infarction, unspecified: Secondary | ICD-10-CM | POA: Diagnosis not present

## 2020-03-04 DIAGNOSIS — I10 Essential (primary) hypertension: Secondary | ICD-10-CM | POA: Diagnosis not present

## 2020-03-04 DIAGNOSIS — E7800 Pure hypercholesterolemia, unspecified: Secondary | ICD-10-CM | POA: Diagnosis not present

## 2020-03-04 DIAGNOSIS — I5022 Chronic systolic (congestive) heart failure: Secondary | ICD-10-CM | POA: Diagnosis not present

## 2020-03-04 DIAGNOSIS — Z79899 Other long term (current) drug therapy: Secondary | ICD-10-CM | POA: Diagnosis not present

## 2020-03-05 ENCOUNTER — Encounter: Payer: PPO | Attending: Physical Medicine and Rehabilitation | Admitting: Physical Medicine and Rehabilitation

## 2020-03-05 ENCOUNTER — Other Ambulatory Visit: Payer: Self-pay

## 2020-03-05 ENCOUNTER — Encounter: Payer: Self-pay | Admitting: Physical Medicine and Rehabilitation

## 2020-03-05 VITALS — BP 133/87 | HR 68 | Temp 98.1°F | Ht 66.0 in | Wt 190.0 lb

## 2020-03-05 DIAGNOSIS — M48062 Spinal stenosis, lumbar region with neurogenic claudication: Secondary | ICD-10-CM

## 2020-03-05 DIAGNOSIS — I63529 Cerebral infarction due to unspecified occlusion or stenosis of unspecified anterior cerebral artery: Secondary | ICD-10-CM | POA: Insufficient documentation

## 2020-03-05 MED ORDER — DULOXETINE HCL 20 MG PO CPEP: 20.0000 mg | ORAL_CAPSULE | Freq: Every day | ORAL | 1 refills | Status: DC

## 2020-03-05 NOTE — Patient Instructions (Signed)
-  Discussed following foods that may reduce pain: 1) Ginger 2) Blueberries 3) Salmon 4) Pumpkin seeds 5) dark chocolate 6) turmeric 7) tart cherries 8) virgin olive oil 9) chilli peppers 10) mint 

## 2020-03-05 NOTE — Progress Notes (Signed)
Subjective:    Patient ID: Ruben Barrera, male    DOB: 1932/05/10, 86 y.o.   MRN: [id number]  HPI  Ruben Barrera is a an 86 year old man who presents for hospital follow-up since CIR admission for stroke.  He has been doing well at home. He has been doing home therapy.   He does have average pain of 6/10 in his lower back. He has plan to get a spinal cord stimulator and asks when he is cleared for this. He has had ESIs without benefit. He is not sure if he tried Cymbalta before.   He has been walking twice per day up and down his driveway.  He does not need any refills today.  Hs wife accompanies him today.    Pain Inventory Average Pain 6 Pain Right Now 6 My pain is aching  LOCATION OF PAIN  Buttocks, groin, hip, thigh, knee, leg,  BOWEL Number of stools per week: 5 Oral laxative use Yes  Type of laxative senna Enema or suppository use No  History of colostomy No  Incontinent No   BLADDER Normal In and out cath, frequency n/a Able to self cath n/a Bladder incontinence No  Frequent urination No  Leakage with coughing No  Difficulty starting stream No  Incomplete bladder emptying No    Mobility walk with assistance use a cane do you drive?  no Do you have any goals in this area?  yes  Function retired  Neuro/Psych No problems in this area  Prior Studies transitonal care  Physicians involved in your care transitional care   Family History  Problem Relation Age of Onset  . Stroke Mother   . Hypertension Other        family history   Social History   Socioeconomic History  . Marital status: Married    Spouse name: Not on file  . Number of children: Not on file  . Years of education: Not on file  . Highest education level: Not on file  Occupational History  . Not on file  Tobacco Use  . Smoking status: Current Every Day Smoker    Types: Cigars  . Smokeless tobacco: Never Used  . Tobacco comment: quit 1972  Vaping Use  . Vaping Use:  Never used  Substance and Sexual Activity  . Alcohol use: Yes    Alcohol/week: 0.0 standard drinks    Comment: occasionally beer or scotch  . Drug use: No    Comment: 1 cigar per day, pt states he does not inhale   . Sexual activity: Not Currently  Other Topics Concern  . Not on file  Social History Narrative   Right handed   Lives with wife one story home   Social Determinants of Health   Financial Resource Strain: Not on file  Food Insecurity: Not on file  Transportation Needs: Not on file  Physical Activity: Not on file  Stress: Not on file  Social Connections: Not on file   Past Surgical History:  Procedure Laterality Date  . BACK SURGERY    . LUMBAR LAMINECTOMY/DECOMPRESSION MICRODISCECTOMY N/A 08/28/2015   Procedure: Lumbar Four-Five decompressive lumbar laminectomy;  Surgeon: Jovita Gamma, MD;  Location: [facility];  Service: Neurosurgery;  Laterality: N/A;  . REPLACEMENT TOTAL KNEE BILATERAL Bilateral 11/05/2003  . TOE AMPUTATION Right 2010   second toe  . TONSILLECTOMY     Past Medical History:  Diagnosis Date  . Arthritis   . Complication of anesthesia    "  had the shivers" after surgery  . Depression   . Dysrhythmia    "extra heart beat"  . GERD (gastroesophageal reflux disease)   . History of hiatal hernia   . Hypercholesteremia   . Hypertension   . Lumbar spinal stenosis   . RLS (restless legs syndrome)    takes ativan as needed   BP 133/87   Pulse 68   Temp 98.1 F (36.7 C)   Ht 5\' 6"  (1.676 m)   Wt 190 lb (86.2 kg)   SpO2 91%   BMI 30.67 kg/m   Opioid Risk Score:   Fall Risk Score:  `1  Depression screen PHQ 2/9  Depression screen PHQ 2/9 03/05/2020  Decreased Interest 0  Down, Depressed, Hopeless 0  PHQ - 2 Score 0  Altered sleeping 0  Tired, decreased energy 0  Change in appetite 0  Feeling bad or failure about yourself  0  Trouble concentrating 0  Moving slowly or fidgety/restless 1  Suicidal thoughts 0  PHQ-9 Score 1     Review of Systems  Constitutional: Negative.   HENT: Negative.   Eyes: Negative.   Respiratory: Negative.   Cardiovascular: Negative.   Gastrointestinal: Negative.   Endocrine: Negative.   Genitourinary: Negative.   Musculoskeletal: Positive for arthralgias and gait problem.  Skin: Negative.   Allergic/Immunologic: Negative.   Hematological: Negative.   Psychiatric/Behavioral: Negative.   All other systems reviewed and are negative.      Objective:   Physical Exam Gen: no distress, normal appearing HEENT: oral mucosa pink and moist, NCAT Cardio: Reg rate Chest: normal effort, normal rate of breathing Abd: soft, non-distended Ext: no edema Skin: intact Neuro: Alert and oriented x3 Musculoskeletal: 4/5 strength right leg. + slump test right side. Flexion and extension limited 25%.  Psych: pleasant, normal affect    Assessment & Plan:  Ruben Barrera is an 86 year old man who presents for hospital follow-up after CIR admission for stroke.   1) s/p ACA stroke -doing well at home.  -his wife has been doing home therapy with him until he starts outpatient tomorrow.  -Advised to increase walking distance to improve strength and balance -Continue biking three times per week to improve strength.  2) Chronic Pain Syndrome secondary to lumbar stenosis with neurogenic claudication -Discussed current symptoms of pain and history of pain.  -Discussed benefits of exercise in reducing pain. -Prescribed Cymbalta 20mg  daily. -Cleared for spinal cord stimulator if he chooses to pursue, recommend maximizing medication management first given his age and risk for complications.  -Discussed following foods that may reduce pain: 1) Ginger 2) Blueberries 3) Salmon 4) Pumpkin seeds 5) dark chocolate 6) turmeric 7) tart cherries 8) virgin olive oil 9) chilli peppers 10) mint

## 2020-03-05 NOTE — Telephone Encounter (Signed)
Patient did come in for his appt. 03/05/2020

## 2020-03-07 ENCOUNTER — Encounter: Payer: Self-pay | Admitting: Physical Therapy

## 2020-03-07 ENCOUNTER — Other Ambulatory Visit: Payer: Self-pay

## 2020-03-07 ENCOUNTER — Ambulatory Visit: Payer: PPO | Admitting: Occupational Therapy

## 2020-03-07 ENCOUNTER — Encounter: Payer: Self-pay | Admitting: Occupational Therapy

## 2020-03-07 ENCOUNTER — Telehealth: Payer: Self-pay | Admitting: Occupational Therapy

## 2020-03-07 ENCOUNTER — Ambulatory Visit: Payer: PPO | Attending: Physician Assistant | Admitting: Physical Therapy

## 2020-03-07 DIAGNOSIS — R262 Difficulty in walking, not elsewhere classified: Secondary | ICD-10-CM

## 2020-03-07 DIAGNOSIS — R41840 Attention and concentration deficit: Secondary | ICD-10-CM | POA: Diagnosis not present

## 2020-03-07 DIAGNOSIS — R2681 Unsteadiness on feet: Secondary | ICD-10-CM | POA: Diagnosis not present

## 2020-03-07 DIAGNOSIS — M6281 Muscle weakness (generalized): Secondary | ICD-10-CM | POA: Diagnosis not present

## 2020-03-07 DIAGNOSIS — R2689 Other abnormalities of gait and mobility: Secondary | ICD-10-CM | POA: Insufficient documentation

## 2020-03-07 DIAGNOSIS — R293 Abnormal posture: Secondary | ICD-10-CM | POA: Insufficient documentation

## 2020-03-07 DIAGNOSIS — I63541 Cerebral infarction due to unspecified occlusion or stenosis of right cerebellar artery: Secondary | ICD-10-CM | POA: Diagnosis not present

## 2020-03-07 DIAGNOSIS — R41844 Frontal lobe and executive function deficit: Secondary | ICD-10-CM

## 2020-03-07 NOTE — Telephone Encounter (Signed)
Dr. Ranell Patrick, Ruben Barrera was seen for an OT evaluation today. He reports word finding difficulties and demonstrates cognitive deficits.  He can benefit from speech therapy to address these deficits.  If you agree please place a referral for Speech therapy evaluation/ treat. Sincerely,  Theone Murdoch, OTR/L

## 2020-03-07 NOTE — Therapy (Signed)
[facility] [address] [place], [place], [address] Phone: [phone]   Fax:  [phone]  Occupational Therapy Evaluation  Patient Details  Name: Ruben Barrera MRN: [id number] Date of Birth: 10-14-1932 Referring Provider (OT): Dr. Swartz/ Dr. Ranell Patrick   Encounter Date: 03/07/2020   OT End of Session - 03/07/20 1412    Visit Number 1    Number of Visits 25   will likely d/c after 8 weeks depending on progress.   Date for OT Re-Evaluation 05/30/20    Authorization Type [employer]    Authorization Time Period pt only scheduled for 2x week x 4 weeks, once ST order obtained- add an additional 2x week x 4 weeks for PT/OT as well as ST visits    Authorization - Visit Number 1    Authorization - Number of Visits 10    Progress Note Due on Visit 10    OT Start Time 1151    OT Stop Time 1230    OT Time Calculation (min) 39 min    Activity Tolerance Treatment limited secondary to agitation    Behavior During Therapy Impulsive;Restless           Past Medical History:  Diagnosis Date  . Arthritis   . Complication of anesthesia    "had the shivers" after surgery  . Depression   . Dysrhythmia    "extra heart beat"  . GERD (gastroesophageal reflux disease)   . History of hiatal hernia   . Hypercholesteremia   . Hypertension   . Lumbar spinal stenosis   . RLS (restless legs syndrome)    takes ativan as needed    Past Surgical History:  Procedure Laterality Date  . BACK SURGERY    . LUMBAR LAMINECTOMY/DECOMPRESSION MICRODISCECTOMY N/A 08/28/2015   Procedure: Lumbar Four-Five decompressive lumbar laminectomy;  Surgeon: Jovita Gamma, MD;  Location: [facility];  Service: Neurosurgery;  Laterality: N/A;  . REPLACEMENT TOTAL KNEE BILATERAL Bilateral 11/05/2003  . TOE AMPUTATION Right 2010   second toe  . TONSILLECTOMY      There were no vitals filed for this visit.   Subjective Assessment - 03/07/20  1158    Patient Stated Goals improve balance    Currently in Pain? Yes    Pain Score 3     Pain Location Leg    Pain Orientation Right;Left    Pain Descriptors / Indicators Aching    Pain Type Chronic pain    Pain Onset More than a month ago    Pain Frequency Intermittent    Aggravating Factors  walking    Pain Relieving Factors meds             [facility] OT Assessment - 03/07/20 1618      Assessment   Medical Diagnosis R cerebellar CVA    Referring Provider (OT) Dr. Swartz/ Dr. Ranell Patrick    Onset Date/Surgical Date 02/20/20    Prior Therapy CIR      Precautions   Precautions Fall    Precaution Comments No driving      Balance Screen   Has the patient fallen in the past 6 months Yes    How many times? 5   before hospitalization   Has the patient had a decrease in activity level because of a fear of falling?  Yes    Is the patient reluctant to leave their home because of a fear of falling?  Yes      Home  Environment  Family/patient expects to be discharged to: Private residence    [place] One level    Alternate Level Stairs - Number of Steps 3 steps    Lives With Spouse      Prior Function   Level of Independence Independent with household mobility with device;Independent with basic ADLs    Leisure exercise      ADL   Eating/Feeding Independent    Grooming Independent    Upper Body Bathing Independent    Lower Body Bathing Modified independent    Upper Body Dressing Independent    Lower Body Dressing Modified independent    Toilet Transfer Modified independent   uses walker ofr cane   Tub/Shower Transfer Modified independent   seated     IADL   Shopping Needs to be accompanied on any shopping trip    Light Housekeeping Performs light daily tasks but cannot maintain acceptable level of cleanliness    Meal Prep Needs to have meals prepared and served    Medication Management Takes responsibility if medication is prepared in advance in  seperate dosage    Financial Management --   wife handles     Mobility   Mobility Status Independent      Written Expression   Handwriting 75% legible      Vision - History   Patient Visual Report --   denies changes, hx of diplopia     Vision Assessment   Vision Assessment Vision not tested      Cognition   Overall Cognitive Status Cognition to be further assessed in functional context PRN    Attention Selective;Alternating;Divided    Selective Attention Impaired    Alternating Attention Impaired    Divided Attention Impaired    Memory Impaired    Memory Impairment Decreased short term memory    Awareness Impaired    Problem Solving Impaired    Executive Function Sequencing;Organizing;Decision Making;Self Correcting;Reasoning    Behaviors Impulsive;Restless;Poor frustration tolerance    Cognition Comments Pt demonstrates poor awareness of cognitive deficits. Pt demonstrates difficulty following instructions, completes trailmaking A, unable to complete trailmaking B      Observation/Other Assessments   Focus on Therapeutic Outcomes (FOTO)  70%    Standing Functional Reach Test R 8 inches, L 9 inches      Sensation   Light Touch Appears Intact      Coordination   9 Hole Peg Test Right;Left    Right 9 Hole Peg Test 25.55    Left 9 Hole Peg Test 28.75      ROM / Strength   AROM / PROM / Strength AROM;Strength      AROM   Overall AROM  Within functional limits for tasks performed      Strength   Overall Strength Deficits    Overall Strength Comments RUE proximal shoulder 4/5, distal 4+/5, LUE 4+/5      Hand Function   Right Hand Grip (lbs) 28.2 lbs    Left Hand Grip (lbs) 27.9 lbs                           OT Education - 03/07/20 1626    Education Details discussed with pt/ wife that pt should wait until he is cleared by MD for driving and that driving is usually one of the last tasks that pt's return to performing sue to safety concerns     Person(s) Educated Patient;Spouse  Methods Explanation;Verbal cues    Comprehension Verbalized understanding            OT Short Term Goals - 03/07/20 1425      OT SHORT TERM GOAL #1   Title I with inital HEP.    Time 4    Period Weeks    Status New    Target Date 04/04/20      OT SHORT TERM GOAL #2   Title Pt will increase bilateral grip strength by 5 lbs for increased ease with ADLs.    Time 4    Period Weeks    Status New      OT SHORT TERM GOAL #3   Title Pt will write a sentence with 90% legibility    Time 4    Period Weeks    Status New      OT SHORT TERM GOAL #4   Title Pt will demonstrate ability to sequence a functional task/ home management task demonstrating good safety awareness, with no more than min v.c    Time 4    Period Weeks    Status New      OT SHORT TERM GOAL #5   Title Pt will ambulate while perfoming a physical/ cognitive task, with 80% accuracy and no LOB.    Baseline decreased alternating attention, slowed processing    Time 4    Period Weeks    Status New      OT SHORT TERM GOAL #6   Title Further assess cognition in functional context and add goal prn.    Time 4    Period Weeks    Status New             OT Long Term Goals - 03/07/20 1430      OT LONG TERM GOAL #1   Title I with updated HEP for right UE proximal strength    Time 12    Period Weeks    Status New    Target Date 05/30/20      OT LONG TERM GOAL #2   Title Pt will increase standing functional reach to 10 inches or greater to minimize fall risk for ADLS.    Baseline R 8 inches, L 9 inches    Time 12    Period Weeks    Status New      OT LONG TERM GOAL #3   Title Pt will write a short paragraph with 100% legibility.    Baseline 75% at sentence level    Time 12    Period Weeks    Status New      OT LONG TERM GOAL #4   Title Pt will demonstrate ability to perform a physical and cognive task simultaneously with 90% or better accuracy and no LOB.    Time  12    Period Weeks    Status New      OT LONG TERM GOAL #5   Title Pt will sequence a functional task/ home managment task and complete modified independently.    Baseline slowed processing, difficulty following instructions decreased attention.    Time 12      Long Term Additional Goals   Additional Long Term Goals Yes      OT LONG TERM GOAL #6   Title Pt / wife will verbalize understanding of return to driving recommendations.    Time 12    Period Weeks    Status New  Plan - 03/07/20 1417    Clinical Impression Statement Pt is an 86 year old male who presented with multiple intermittent bouts of slurred speech, confusion,  blurreddouble vision since 02/16/20. Pt was hospitalized on 02/20/20.Marland Kitchen He has also been displaying gait abnormalities since October. CT and MRI of head revealed possible acute or subacute R middle cerebellar peduncle infarct. NIHSS 0-2. PMH: lumbar spinal stenosis, HTN, dysrhythmia, and arthritis, CHF, GERD Pt was seen on CIR and d/c home 02/27/20 Pt presents  to occupational therapy with the following deficits: cogntive deficits, decreased balance, decreased functional mobility,  decreased strength, which impedes perfromance of ADLs/ IADLS. Pt cen benefit from skilled occupational therapy to address these deficits in order to maximize pt's safety and I with daily activities. Pt reports word finding difficulties and he is interested in speech therapy.    OT Occupational Profile and History Detailed Assessment- Review of Records and additional review of physical, cognitive, psychosocial history related to current functional performance    Occupational performance deficits (Please refer to evaluation for details): ADL's;IADL's;Work;Leisure;Play;Social Participation    Body Structure / Function / Physical Skills ADL;Balance;UE functional use;ROM;Gait;GMC;Decreased knowledge of precautions;Coordination;IADL;Decreased knowledge of use of  DME;Strength;Dexterity;Mobility    Cognitive Skills Attention;Memory;Problem Solve;Safety Awareness;Sequencing;Temperament/Personality;Thought;Understand    Rehab Potential Good    Clinical Decision Making Limited treatment options, no task modification necessary    Comorbidities Affecting Occupational Performance: May have comorbidities impacting occupational performance    Modification or Assistance to Complete Evaluation  No modification of tasks or assist necessary to complete eval    OT Frequency 2x / week   plus eval anticipate d/c after 8 weeks   OT Duration 12 weeks    OT Treatment/Interventions Self-care/ADL training;Ultrasound;Energy conservation;Patient/family education;DME and/or AE instruction;Aquatic Therapy;Paraffin;Gait Training;Passive range of motion;Balance training;Fluidtherapy;Cryotherapy;Electrical Stimulation;Contrast Bath;Functional Mobility Training;Therapeutic activities;Manual Therapy;Therapeutic exercise;Moist Heat;Neuromuscular education;Cognitive remediation/compensation    Plan putty HEP for grip strength, dynamic standing balance/ safety for ADLS                                                                                                                                                                                                                                                                      check to see if STorder received, if so schedule  ST eval, pt is currently only scheduled for 4 weeks of OT/ PT, so once ST order recieved please add and addditonal 2x week x 4 weeks for OT/PT and add ST visits    Consulted and Agree with Plan of Care Patient;Family member/caregiver    Family Member Consulted wife           Patient will benefit from skilled therapeutic intervention in order to improve the following deficits and impairments:   Body Structure / Function / Physical Skills: ADL,Balance,UE functional use,ROM,Gait,GMC,Decreased knowledge of  precautions,Coordination,IADL,Decreased knowledge of use of DME,Strength,Dexterity,Mobility Cognitive Skills: Attention,Memory,Problem Solve,Safety Awareness,Sequencing,Temperament/Personality,Thought,Understand     Visit Diagnosis: Muscle weakness (generalized) - Plan: Ot plan of care cert/re-cert  Frontal lobe and executive function deficit - Plan: Ot plan of care cert/re-cert  Attention and concentration deficit - Plan: Ot plan of care cert/re-cert  Other abnormalities of gait and mobility - Plan: Ot plan of care cert/re-cert  Unsteadiness on feet - Plan: Ot plan of care cert/re-cert    Problem List Patient Active Problem List   Diagnosis Date Noted  . Dyslipidemia   . Chronic systolic congestive heart failure ([place])   . Acute ischemic cerebrovascular accident (CVA) involving anterior cerebral artery territory ([place]) 02/22/2020  . Mixed hyperlipidemia   . Tobacco abuse   . Depression   . Acute stroke due to occlusion of right cerebellar artery ([place]) 02/20/2020  . Erythrocytosis 02/20/2020  . BPH (benign prostatic hyperplasia) 02/20/2020  . HNP (herniated nucleus pulposus), lumbar 01/13/2016  . Lumbar stenosis with neurogenic claudication 08/28/2015  . DOE (dyspnea on exertion) 05/01/2014  . Essential hypertension 05/01/2014  . Left shoulder pain 05/05/2011    Ruben Barrera 03/07/2020, 4:31 PM  [facility] [address] [place], [place], [address] Phone: [phone]   Fax:  [phone]  Name: Ruben Barrera MRN: [id number] Date of Birth: 12-Jul-1932

## 2020-03-07 NOTE — Therapy (Signed)
[facility] [address] [place], [place], [address] Phone: [phone]   Fax:  [phone]  Physical Therapy Evaluation  Patient Details  Name: Ruben Barrera MRN: [id number] Date of Birth: 1932/10/27 Referring Provider (PT): Cathlyn Parsons, PA-C   Encounter Date: 03/07/2020   PT End of Session - 03/07/20 1508    Visit Number 1    Number of Visits 9    Date for PT Re-Evaluation 04/04/20    Authorization Type [employer]    PT Start Time 1517    PT Stop Time 1315    PT Time Calculation (min) 40 min    Equipment Utilized During Treatment Gait belt    Activity Tolerance Patient tolerated treatment well    Behavior During Therapy Impulsive;WFL for tasks assessed/performed           Past Medical History:  Diagnosis Date  . Arthritis   . Complication of anesthesia    "had the shivers" after surgery  . Depression   . Dysrhythmia    "extra heart beat"  . GERD (gastroesophageal reflux disease)   . History of hiatal hernia   . Hypercholesteremia   . Hypertension   . Lumbar spinal stenosis   . RLS (restless legs syndrome)    takes ativan as needed    Past Surgical History:  Procedure Laterality Date  . BACK SURGERY    . LUMBAR LAMINECTOMY/DECOMPRESSION MICRODISCECTOMY N/A 08/28/2015   Procedure: Lumbar Four-Five decompressive lumbar laminectomy;  Surgeon: Jovita Gamma, MD;  Location: [facility];  Service: Neurosurgery;  Laterality: N/A;  . REPLACEMENT TOTAL KNEE BILATERAL Bilateral 11/05/2003  . TOE AMPUTATION Right 2010   second toe  . TONSILLECTOMY      There were no vitals filed for this visit.    Subjective Assessment - 03/07/20 1241    Subjective Pt reports decreased balance at home. Pt states it seems that he's losing his balance while walking (he reports improved balance with increased gait speed). No other major issues. Pt was using cane before the stroke and now he's been using the  walker more. He is currently using the walker outside. Pt reports he is to get a spinal cord stimulator after January/February.    Patient is accompained by: Family member   wife   Limitations Walking    How long can you walk comfortably? 2-3 loops down the drive way with the walker    Patient Stated Goals Walking outside on unlevel; get back to using the cane exclusively.    Currently in Pain? Yes    Pain Score 3     Pain Location Back    Pain Descriptors / Indicators Aching    Pain Type Chronic pain              [facility] PT Assessment - 03/07/20 1247      Assessment   Medical Diagnosis R cerebellar CVA    Referring Provider (PT) Cathlyn Parsons, PA-C    Onset Date/Surgical Date 02/20/20    Prior Therapy CIR and HHPT      Balance Screen   Has the patient fallen in the past 6 months Yes    How many times? 1   None since the stroke; on again falling since February after COVID vaccine   Has the patient had a decrease in activity level because of a fear of falling?  Yes    Is the patient reluctant to leave their home because of a fear  of falling?  No      Home Ecologist residence    Living Arrangements Spouse/significant other    Available Help at [facility] to enter    Entrance Stairs-Number of Steps [place] One level    [place] - 2 wheels;Cane - quad      Prior Function   Level of Independence Independent with household mobility with device;Independent with basic ADLs    Leisure exercise, golf      Observation/Other Assessments   Focus on Therapeutic Outcomes (FOTO)  70%      Sensation   Light Touch Appears Intact      Coordination   Gross Motor Movements are Fluid and Coordinated Yes    Fine Motor Movements are Fluid and Coordinated Yes      ROM / Strength   AROM / PROM / Strength Strength      Strength   Overall Strength Comments L hip 3+/5;  otherwise, L LE grossly 4+/5. R LE grossly 5/5.      Transfers   Transfers Sit to Stand;Stand to Sit    Sit to Stand 5: Supervision    Five time sit to stand comments  8.59 sec    Stand to Sit 5: Supervision      Ambulation/Gait   Ambulation Distance (Feet) 330 Feet    Assistive device Small based quad cane    Gait Pattern Antalgic;Right flexed knee in stance;Left flexed knee in stance;Step-through pattern;Trunk flexed;Lateral trunk lean to right;Lateral trunk lean to left;Right foot flat;Left foot flat;Poor foot clearance - right;Poor foot clearance - left    Ambulation Surface Level;Indoor      Standardized Balance Assessment   Standardized Balance Assessment Berg Balance Test;Dynamic Gait Index      Berg Balance Test   Sit to Stand Able to stand without using hands and stabilize independently    Standing Unsupported Able to stand safely 2 minutes    Sitting with Back Unsupported but Feet Supported on Floor or Stool Able to sit safely and securely 2 minutes    Stand to Sit Controls descent by using hands    Transfers Able to transfer safely, definite need of hands    Standing Unsupported with Eyes Closed Able to stand 10 seconds with supervision    Standing Unsupported with Feet Together Able to place feet together independently and stand for 1 minute with supervision    From Standing, Reach Forward with Outstretched Arm Can reach forward >12 cm safely (5")    From Standing Position, Pick up Object from Floor Able to pick up shoe, needs supervision    From Standing Position, Turn to Look Behind Over each Shoulder Looks behind from both sides and weight shifts well    Turn 360 Degrees Able to turn 360 degrees safely one side only in 4 seconds or less    Standing Unsupported, Alternately Place Feet on Step/Stool Able to complete >2 steps/needs minimal assist   1 LOB   Standing Unsupported, One Foot in Front Able to plae foot ahead of the other independently and hold 30 seconds     Standing on One Leg Tries to lift leg/unable to hold 3 seconds but remains standing independently   Unable on L; can hold 5 sec on R   Total Score 42      Functional Gait  Assessment   Gait Level  Surface Walks 20 ft in less than 7 sec but greater than 5.5 sec, uses assistive device, slower speed, mild gait deviations, or deviates 6-10 in outside of the 12 in walkway width.    Change in Gait Speed Able to change speed, demonstrates mild gait deviations, deviates 6-10 in outside of the 12 in walkway width, or no gait deviations, unable to achieve a major change in velocity, or uses a change in velocity, or uses an assistive device.    Gait with Horizontal Head Turns Performs head turns smoothly with slight change in gait velocity (eg, minor disruption to smooth gait path), deviates 6-10 in outside 12 in walkway width, or uses an assistive device.    Gait with Vertical Head Turns Performs task with slight change in gait velocity (eg, minor disruption to smooth gait path), deviates 6 - 10 in outside 12 in walkway width or uses assistive device    Gait and Pivot Turn Pivot turns safely in greater than 3 sec and stops with no loss of balance, or pivot turns safely within 3 sec and stops with mild imbalance, requires small steps to catch balance.                      Objective measurements completed on examination: See above findings.               PT Education - 03/07/20 1517    Education Details Discussed exam findings and POC    Person(s) Educated Patient;Spouse    Methods Explanation    Comprehension Verbalized understanding            PT Short Term Goals - 03/07/20 1528      PT SHORT TERM GOAL #1   Title Pt will be independent with initial HEP    Time 2    Period Weeks    Status New    Target Date 03/21/20      PT SHORT TERM GOAL #2   Title PT will be able to complete FGA and create appropriate goal within the next 1-2 visits    Time 2    Period Weeks     Status New    Target Date 03/21/20             PT Long Term Goals - 03/07/20 1531      PT LONG TERM GOAL #1   Title Pt will be independent with balance exercises at home    Time 4    Period Weeks    Status New    Target Date 04/04/20      PT LONG TERM GOAL #2   Title Pt will have improved gait speed by at least .4 ft/sec to demo minimal clinically important difference    Time 4    Period Weeks    Status New    Target Date 04/04/20      PT LONG TERM GOAL #3   Title Pt will have improved Berg Balance score to at least 45/56 to demo decreased fall risk    Baseline 42/56 on eval    Time 4    Period Weeks    Status New    Target Date 04/04/20      PT LONG TERM GOAL #4   Title Pt's FOTO score will remain at or above 70%    Time 4    Period Weeks    Status New    Target Date 04/04/20      PT  LONG TERM GOAL #5   Title Pt will be able to demo safe mobility with LRAD outdoors on unlevel surfaces for >1000' for improved community mobility    Baseline Currently just making loops to the mailbox on level ground (Pt's major goal is to return to his walking)    Time 4    Period Weeks    Status New    Target Date 04/04/20                  Plan - 03/07/20 1509    Clinical Impression Statement Ruben Barrera is an 86 y/o M presenting to OPPT due to complaints of decreased balance. Pt with history of chronic back pain with lumbar stenosis (pt is to get a spinal stimulator in Jan/Feb 2022), HTN, dysrhythmia, and arthritis, CHF, and GERD. On assessment, pt limited due to increasing pain (pt had forgotten to bring his pain medication) -- unable to fully complete his FGA. Pt found to have decreased balance with increased fall risk based on Berg Balance score of 42/56. Pt with L>R LE weakness but tends to have LOBs to his R. Pt with antalgic gait using SPC. Pt would benefit from PT to address these issues to optimize his level of function and return to prior level (was only  using SPC at times) for safe home and community mobility.    Personal Factors and Comorbidities Age;Comorbidity 1;Comorbidity 2;Time since onset of injury/illness/exacerbation    Comorbidities chronic back pain from lumbar stenosis, HTN, arthritis    Examination-Activity Limitations Locomotion Level;Stand;Stairs;Squat    Examination-Participation Restrictions Community Activity;Driving;Yard Work    [employer] officer Evolving/Moderate complexity    Clinical Decision Making Moderate    Rehab Potential Good    PT Frequency 2x / week    PT Duration 4 weeks   plan for pt to schedule another 4 wks after he gets his Speech therapy consult   PT Treatment/Interventions ADLs/Self Care Home Management;Aquatic Therapy;Electrical Stimulation;Cryotherapy;Moist Heat;DME Instruction;Gait training;Stair training;Functional mobility training;Therapeutic activities;Therapeutic exercise;Balance training;Neuromuscular re-education;Iontophoresis 4mg /ml Dexamethasone;Patient/family education;Manual techniques;Passive range of motion;Dry needling;Taping;Vasopneumatic Device;Vestibular    PT Next Visit Plan Complete DGI. Progress pt's current exercise regimen from HHPT (they have been mostly doing strengthening with band exercises per pt report), include balance exercises. Pt requests tennis balls for his RW (he did not bring in his RW today).    Recommended Other Services Pt is to get referral for Speech Therapy    Consulted and Agree with Plan of Care Patient           Patient will benefit from skilled therapeutic intervention in order to improve the following deficits and impairments:  Abnormal gait,Difficulty walking,Decreased activity tolerance,Pain,Decreased balance,Hypomobility,Impaired flexibility,Improper body mechanics,Decreased mobility,Decreased strength,Postural dysfunction  Visit Diagnosis: Acute stroke due to occlusion of right cerebellar artery (HCC)  Unsteadiness on feet  Other  abnormalities of gait and mobility  Difficulty in walking, not elsewhere classified  Abnormal posture     Problem List Patient Active Problem List   Diagnosis Date Noted  . Dyslipidemia   . Chronic systolic congestive heart failure ([place])   . Acute ischemic cerebrovascular accident (CVA) involving anterior cerebral artery territory ([place]) 02/22/2020  . Mixed hyperlipidemia   . Tobacco abuse   . Depression   . Acute stroke due to occlusion of right cerebellar artery ([place]) 02/20/2020  . Erythrocytosis 02/20/2020  . BPH (benign prostatic hyperplasia) 02/20/2020  . HNP (herniated nucleus pulposus), lumbar 01/13/2016  . Lumbar stenosis with neurogenic claudication 08/28/2015  .  DOE (dyspnea on exertion) 05/01/2014  . Essential hypertension 05/01/2014  . Left shoulder pain 05/05/2011    [facility] [address] [place] PT, DPT 03/07/2020, 3:41 PM  [facility] [address] [place], [place], [address] Phone: [phone]   Fax:  [phone]  Name: Ruben Barrera MRN: [id number] Date of Birth: 10-Jan-1933

## 2020-03-08 NOTE — Addendum Note (Signed)
Addended by: Izora Ribas on: 03/08/2020 07:19 PM   Modules accepted: Orders

## 2020-03-12 ENCOUNTER — Encounter: Payer: Self-pay | Admitting: Physical Therapy

## 2020-03-12 ENCOUNTER — Ambulatory Visit: Payer: PPO | Admitting: Occupational Therapy

## 2020-03-12 ENCOUNTER — Ambulatory Visit: Payer: PPO | Admitting: Physical Therapy

## 2020-03-12 ENCOUNTER — Other Ambulatory Visit: Payer: Self-pay

## 2020-03-12 DIAGNOSIS — I63541 Cerebral infarction due to unspecified occlusion or stenosis of right cerebellar artery: Secondary | ICD-10-CM | POA: Diagnosis not present

## 2020-03-12 DIAGNOSIS — R2681 Unsteadiness on feet: Secondary | ICD-10-CM

## 2020-03-12 DIAGNOSIS — M6281 Muscle weakness (generalized): Secondary | ICD-10-CM

## 2020-03-12 DIAGNOSIS — R262 Difficulty in walking, not elsewhere classified: Secondary | ICD-10-CM

## 2020-03-12 DIAGNOSIS — R41844 Frontal lobe and executive function deficit: Secondary | ICD-10-CM

## 2020-03-12 DIAGNOSIS — R2689 Other abnormalities of gait and mobility: Secondary | ICD-10-CM

## 2020-03-12 DIAGNOSIS — R41840 Attention and concentration deficit: Secondary | ICD-10-CM

## 2020-03-12 NOTE — Therapy (Signed)
[facility] [address] [place], [place], [address] Phone: [phone]   Fax:  [phone]  Occupational Therapy Treatment  Patient Details  Name: Ruben Barrera MRN: [id number] Date of Birth: 04-11-1932 Referring Provider (OT): Dr. Swartz/ Dr. Ranell Patrick   Encounter Date: 03/12/2020   OT End of Session - 03/12/20 1111    Visit Number 2    Number of Visits 25   will likely d/c after 8 weeks depending on progress.   Date for OT Re-Evaluation 05/30/20    Authorization Type [employer]    Authorization Time Period pt only scheduled for 2x week x 4 weeks, once ST order obtained- add an additional 2x week x 4 weeks for PT/OT as well as ST visits    Authorization - Visit Number 2    Authorization - Number of Visits 10    Progress Note Due on Visit 10    OT Start Time 1104    OT Stop Time 1143    OT Time Calculation (min) 39 min    Activity Tolerance Treatment limited secondary to agitation    Behavior During Therapy Impulsive;Restless           Past Medical History:  Diagnosis Date  . Arthritis   . Complication of anesthesia    "had the shivers" after surgery  . Depression   . Dysrhythmia    "extra heart beat"  . GERD (gastroesophageal reflux disease)   . History of hiatal hernia   . Hypercholesteremia   . Hypertension   . Lumbar spinal stenosis   . RLS (restless legs syndrome)    takes ativan as needed    Past Surgical History:  Procedure Laterality Date  . BACK SURGERY    . LUMBAR LAMINECTOMY/DECOMPRESSION MICRODISCECTOMY N/A 08/28/2015   Procedure: Lumbar Four-Five decompressive lumbar laminectomy;  Surgeon: Jovita Gamma, MD;  Location: [facility];  Service: Neurosurgery;  Laterality: N/A;  . REPLACEMENT TOTAL KNEE BILATERAL Bilateral 11/05/2003  . TOE AMPUTATION Right 2010   second toe  . TONSILLECTOMY      There were no vitals filed for this visit.   Subjective Assessment - 03/12/20  1108    Subjective  "I never have to write my address"  Pt reports that he occasion he will write a list for himself    Patient Stated Goals improve balance    Currently in Pain? Yes    Pain Score 5    chronic back pain   Pain Location Leg    Pain Orientation Right;Left    Pain Descriptors / Indicators Dull    Pain Type Chronic pain    Pain Onset More than a month ago    Pain Frequency Intermittent    Aggravating Factors  nothing    Pain Relieving Factors excercise              Practiced writing.  Pt able to copy sentences with approx 75-85% legibility.  Legibility incr with cueing to slow down and incr spacing.  Pt had difficulty recalling street name to write address (needed min cueing) and wrote zip code incorrectly (but verbally confirmed correct one). Pt wrote signature and reports that this is at baseline.    Standing, functional reaching with wt. Shifts outside base of support with close supervision but no LOB.          OT Education - 03/12/20 1159    Education Details Green putty HEP for bilateral hands--see pt instructions  Person(s) Educated Patient    Methods Explanation;Demonstration;Handout;Verbal cues    Comprehension Verbalized understanding;Returned demonstration;Verbal cues required            OT Short Term Goals - 03/07/20 1425      OT SHORT TERM GOAL #1   Title I with inital HEP.    Time 4    Period Weeks    Status New    Target Date 04/04/20      OT SHORT TERM GOAL #2   Title Pt will increase bilateral grip strength by 5 lbs for increased ease with ADLs.    Time 4    Period Weeks    Status New      OT SHORT TERM GOAL #3   Title Pt will write a sentence with 90% legibility    Time 4    Period Weeks    Status New      OT SHORT TERM GOAL #4   Title Pt will demonstrate ability to sequence a functional task/ home management task demonstrating good safety awareness, with no more than min v.c    Time 4    Period Weeks    Status New       OT SHORT TERM GOAL #5   Title Pt will ambulate while perfoming a physical/ cognitive task, with 80% accuracy and no LOB.    Baseline decreased alternating attention, slowed processing    Time 4    Period Weeks    Status New      OT SHORT TERM GOAL #6   Title Further assess cognition in functional context and add goal prn.    Time 4    Period Weeks    Status New             OT Long Term Goals - 03/07/20 1430      OT LONG TERM GOAL #1   Title I with updated HEP for right UE proximal strength    Time 12    Period Weeks    Status New    Target Date 05/30/20      OT LONG TERM GOAL #2   Title Pt will increase standing functional reach to 10 inches or greater to minimize fall risk for ADLS.    Baseline R 8 inches, L 9 inches    Time 12    Period Weeks    Status New      OT LONG TERM GOAL #3   Title Pt will write a short paragraph with 100% legibility.    Baseline 75% at sentence level    Time 12    Period Weeks    Status New      OT LONG TERM GOAL #4   Title Pt will demonstrate ability to perform a physical and cognive task simultaneously with 90% or better accuracy and no LOB.    Time 12    Period Weeks    Status New      OT LONG TERM GOAL #5   Title Pt will sequence a functional task/ home managment task and complete modified independently.    Baseline slowed processing, difficulty following instructions decreased attention.    Time 12      Long Term Additional Goals   Additional Long Term Goals Yes      OT LONG TERM GOAL #6   Title Pt / wife will verbalize understanding of return to driving recommendations.    Time 12    Period Weeks    Status New  Plan - 03/12/20 1111    Clinical Impression Statement Pt is progressing towards goals.  He verbalizes understanding of putty HEP.  However, he demo decr awareness of deficits and can be impulsive at times.  Pt with difficulty recalling street name and zip code today.    OT Occupational  Profile and History Detailed Assessment- Review of Records and additional review of physical, cognitive, psychosocial history related to current functional performance    Occupational performance deficits (Please refer to evaluation for details): ADL's;IADL's;Work;Leisure;Play;Social Participation    Body Structure / Function / Physical Skills ADL;Balance;UE functional use;ROM;Gait;GMC;Decreased knowledge of precautions;Coordination;IADL;Decreased knowledge of use of DME;Strength;Dexterity;Mobility    Cognitive Skills Attention;Memory;Problem Solve;Safety Awareness;Sequencing;Temperament/Personality;Thought;Understand    Rehab Potential Good    Clinical Decision Making Limited treatment options, no task modification necessary    Comorbidities Affecting Occupational Performance: May have comorbidities impacting occupational performance    Modification or Assistance to Complete Evaluation  No modification of tasks or assist necessary to complete eval    OT Frequency 2x / week   plus eval anticipate d/c after 8 weeks   OT Duration 12 weeks    OT Treatment/Interventions Self-care/ADL training;Ultrasound;Energy conservation;Patient/family education;DME and/or AE instruction;Aquatic Therapy;Paraffin;Gait Training;Passive range of motion;Balance training;Fluidtherapy;Cryotherapy;Electrical Stimulation;Contrast Bath;Functional Mobility Training;Therapeutic activities;Manual Therapy;Therapeutic exercise;Moist Heat;Neuromuscular education;Cognitive remediation/compensation    Plan further assess functional cognition, dynamic standing balance/ safety                                                                                                                                                                                                                                                                                      check to see if STorder received, if so schedule ST eval, pt is currently only scheduled for 4  weeks of OT/ PT, so once ST order recieved please add and addditonal 2x week x 4 weeks for OT/PT and add ST visits    Consulted and Agree with Plan of Care Patient;Family member/caregiver    Family Member Consulted wife           Patient will benefit from skilled therapeutic intervention in order to improve the following deficits and impairments:   Body  Structure / Function / Physical Skills: ADL,Balance,UE functional use,ROM,Gait,GMC,Decreased knowledge of precautions,Coordination,IADL,Decreased knowledge of use of DME,Strength,Dexterity,Mobility Cognitive Skills: Attention,Memory,Problem Solve,Safety Awareness,Sequencing,Temperament/Personality,Thought,Understand     Visit Diagnosis: Muscle weakness (generalized)  Frontal lobe and executive function deficit  Attention and concentration deficit  Other abnormalities of gait and mobility  Unsteadiness on feet    Problem List Patient Active Problem List   Diagnosis Date Noted  . Dyslipidemia   . Chronic systolic congestive heart failure ([place])   . Acute ischemic cerebrovascular accident (CVA) involving anterior cerebral artery territory ([place]) 02/22/2020  . Mixed hyperlipidemia   . Tobacco abuse   . Depression   . Acute stroke due to occlusion of right cerebellar artery ([place]) 02/20/2020  . Erythrocytosis 02/20/2020  . BPH (benign prostatic hyperplasia) 02/20/2020  . HNP (herniated nucleus pulposus), lumbar 01/13/2016  . Lumbar stenosis with neurogenic claudication 08/28/2015  . DOE (dyspnea on exertion) 05/01/2014  . Essential hypertension 05/01/2014  . Left shoulder pain 05/05/2011    [facility] 03/12/2020, 12:15 PM  [facility] [address] [place], [place], [address] Phone: [phone]   Fax:  [phone]  Name: JOSEMIGUEL GRIES MRN: [id number] Date of Birth: 1932/07/29   Vianne Bulls, OTR/L [facility] [address]  [place], [place]  [id number] [phone] phone [phone] 03/12/20 12:15 PM

## 2020-03-12 NOTE — Patient Instructions (Signed)
   11. Grip Strengthening (Resistive Putty)   Squeeze putty using thumb and all fingers. Repeat 20 times. Do 1-2 sessions per day.   Extension (Assistive Putty)   Roll putty back and forth, being sure to use all fingertips. Repeat 2 times. Do 1-2 sessions per day.  Then pinch as below.   Palmar Pinch Strengthening (Resistive Putty)   Pinch putty between thumb and each fingertip in turn after rolling out

## 2020-03-12 NOTE — Patient Instructions (Signed)
Access Code: [facility] URL: [email or web address] Date: 03/12/2020 Prepared by: Janann August  Exercises Backward Walking with Counter Support - 1-2 x daily - 3 x weekly - 1-2 sets Standing Marching - 1-2 x daily - 3 x weekly - 1-2 sets Side Stepping with Counter Support - 1-2 x daily - 3 x weekly - 1-2 sets Standing Single Leg Stance with Counter Support - 1-2 x daily - 3 x weekly - 3 sets - 10 hold Romberg Stance with Head Nods - 1-2 x daily - 3 x weekly - 2 sets - 5 reps

## 2020-03-12 NOTE — Therapy (Signed)
[facility] [address] [place], [place], [address] Phone: [phone]   Fax:  [phone]  Physical Therapy Treatment  Patient Details  Name: Ruben ShihRoy D Barrera MRN: [id number] Date of Birth: 12/04/1932 Referring Provider (PT): Charlton AmorAngiulli, Daniel J, PA-C   Encounter Date: 03/12/2020   PT End of Session - 03/12/20 1231    Visit Number 2    Number of Visits 9    Date for PT Re-Evaluation 04/04/20    Authorization Type [employer]    PT Start Time 1148    PT Stop Time 1228    PT Time Calculation (min) 40 min    Equipment Utilized During Treatment Gait belt    Activity Tolerance Patient tolerated treatment well;Patient limited by pain;Patient limited by fatigue   seated rest breaks taken as necessary   Behavior During Therapy Impulsive;WFL for tasks assessed/performed           Past Medical History:  Diagnosis Date  . Arthritis   . Complication of anesthesia    "had the shivers" after surgery  . Depression   . Dysrhythmia    "extra heart beat"  . GERD (gastroesophageal reflux disease)   . History of hiatal hernia   . Hypercholesteremia   . Hypertension   . Lumbar spinal stenosis   . RLS (restless legs syndrome)    takes ativan as needed    Past Surgical History:  Procedure Laterality Date  . BACK SURGERY    . LUMBAR LAMINECTOMY/DECOMPRESSION MICRODISCECTOMY N/A 08/28/2015   Procedure: Lumbar Four-Five decompressive lumbar laminectomy;  Surgeon: Shirlean Kellyobert Nudelman, MD;  Location: [facility];  Service: Neurosurgery;  Laterality: N/A;  . REPLACEMENT TOTAL KNEE BILATERAL Bilateral 11/05/2003  . TOE AMPUTATION Right 2010   second toe  . TONSILLECTOMY      There were no vitals filed for this visit.   Subjective Assessment - 03/12/20 1151    Subjective Now using his cane with a 6 prong tip, no longer using the walker. Reports balance is getting a lot better. Right now not having any spasms in his back, has  constant pain in the legs. Has to take hydrocodone for the pain to go away in his legs. Right now for exercise at home is doing the recumbent bike 3x a week for 30-40 minutes. Also doing upper body band exercises with his wife.    Patient is accompained by: Family member   wife   Limitations Walking    How long can you walk comfortably? 2-3 loops down the drive way with the walker    Patient Stated Goals Walking outside on unlevel; get back to using the cane exclusively.    Currently in Pain? Yes    Pain Score 2    chronic back pain   Pain Location Leg    Pain Orientation Right;Left    Pain Descriptors / Indicators Dull    Pain Type Chronic pain    Pain Relieving Factors pain pills              [facility] PT Assessment - 03/12/20 1201      Dynamic Gait Index   Level Surface Mild Impairment    Change in Gait Speed Mild Impairment    Gait with Horizontal Head Turns Moderate Impairment    Gait with Vertical Head Turns Mild Impairment    Gait and Pivot Turn Mild Impairment    Step Over Obstacle Mild Impairment    Step Around Obstacles Mild Impairment  Steps Mild Impairment    Total Score 15    DGI comment: 15/24                       Access Code: [facility] URL: [email or web address] Date: 03/12/2020 Prepared by: Sherlie Ban  Reviewed exercises that pt was given from inpatient rehab as well as added additional exercises for balance. Pt needing a seated rest break between each due to back pain. Disccussed with pt having a chair nearby counter when performing to rest.    Exercises Backward Walking with Counter Support - 1-2 x daily - 3 x weekly - 1-2 sets - cues for wider BOS  Standing Marching - 1-2 x daily - 3 x weekly - 1-2 sets - at countertop, cues for slowed and controlled  Side Stepping with Counter Support - 1-2 x daily - 3 x weekly - 1-2 sets - Standing Single Leg Stance with Counter Support - 1-2 x daily - 3 x weekly - 3 sets - 10 hold -  performed in the corner with UE support on chair  Romberg Stance with Head Nods - 1-2 x daily - 3 x weekly - 2 sets - 5 reps - feet together with head nods 2 x 5 reps, and head turns 2 x 5 reps    [facility] Adult PT Treatment/Exercise - 03/12/20 0001      Transfers   Transfers Sit to Stand;Stand to Sit    Sit to Stand 5: Supervision    Stand to Sit 5: Supervision    Comments pt with one episode of sit <> stand where pt stood up too fast and almost lost his balance, requiring mod A from therapist to maintain balance      Ambulation/Gait   Ambulation/Gait Yes    Ambulation/Gait Assistance 5: Supervision    Ambulation/Gait Assistance Details between activities, pt carrying cane at times during gait    Assistive device Other (Comment)   SPC with 6 prong tip   Gait Pattern Antalgic;Right flexed knee in stance;Left flexed knee in stance;Step-through pattern;Trunk flexed;Lateral trunk lean to right;Lateral trunk lean to left;Right foot flat;Left foot flat;Poor foot clearance - right;Poor foot clearance - left    Ambulation Surface Level;Indoor                  PT Education - 03/12/20 1231    Education Details initial HEP, results of DGI    Person(s) Educated Patient    Methods Explanation;Demonstration;Verbal cues    Comprehension Verbalized understanding;Returned demonstration            PT Short Term Goals - 03/07/20 1528      PT SHORT TERM GOAL #1   Title Pt will be independent with initial HEP    Time 2    Period Weeks    Status New    Target Date 03/21/20      PT SHORT TERM GOAL #2   Title PT will be able to complete FGA and create appropriate goal within the next 1-2 visits    Time 2    Period Weeks    Status New    Target Date 03/21/20             PT Long Term Goals - 03/07/20 1531      PT LONG TERM GOAL #1   Title Pt will be independent with balance exercises at home    Time 4    Period Weeks    Status New  Target Date 04/04/20      PT LONG TERM GOAL  #2   Title Pt will have improved gait speed by at least .4 ft/sec to demo minimal clinically important difference    Time 4    Period Weeks    Status New    Target Date 04/04/20      PT LONG TERM GOAL #3   Title Pt will have improved Berg Balance score to at least 45/56 to demo decreased fall risk    Baseline 42/56 on eval    Time 4    Period Weeks    Status New    Target Date 04/04/20      PT LONG TERM GOAL #4   Title Pt's FOTO score will remain at or above 70%    Time 4    Period Weeks    Status New    Target Date 04/04/20      PT LONG TERM GOAL #5   Title Pt will be able to demo safe mobility with LRAD outdoors on unlevel surfaces for >1000' for improved community mobility    Baseline Currently just making loops to the mailbox on level ground (Pt's major goal is to return to his walking)    Time 4    Period Weeks    Status New    Target Date 04/04/20                 Plan - 03/12/20 1239    Clinical Impression Statement Performed the DGI today with pt scoring a 15/24 with SPC with 6 prong tip, putting pt at an incr risk for falls. Pt had one instance of needing mod A for balance when performing sit <> stands too quickly. Initiated HEP today -reviewed what pt was given from inpatient rehab and adding additional balance. Pt needing seated rest breaks as appropriate due to BLE fatigue and back pain. Will continue to progress towards LTGs.    Personal Factors and Comorbidities Age;Comorbidity 1;Comorbidity 2;Time since onset of injury/illness/exacerbation    Comorbidities chronic back pain from lumbar stenosis, HTN, arthritis    Examination-Activity Limitations Locomotion Level;Stand;Stairs;Squat    Examination-Participation Restrictions Community Activity;Driving;Yard Work    [employer] officer Evolving/Moderate complexity    Rehab Potential Good    PT Frequency 2x / week    PT Duration 4 weeks   plan for pt to schedule another 4 wks after he gets his  Speech therapy consult   PT Treatment/Interventions ADLs/Self Care Home Management;Aquatic Therapy;Electrical Stimulation;Cryotherapy;Moist Heat;DME Instruction;Gait training;Stair training;Functional mobility training;Therapeutic activities;Therapeutic exercise;Balance training;Neuromuscular re-education;Iontophoresis 4mg /ml Dexamethasone;Patient/family education;Manual techniques;Passive range of motion;Dry needling;Taping;Vasopneumatic Device;Vestibular    PT Next Visit Plan how is HEP? gait training/dynamic gait with cane, standing balance, functional BLE strengthening (more so LLE>RLE).    Consulted and Agree with Plan of Care Patient           Patient will benefit from skilled therapeutic intervention in order to improve the following deficits and impairments:  Abnormal gait,Difficulty walking,Decreased activity tolerance,Pain,Decreased balance,Hypomobility,Impaired flexibility,Improper body mechanics,Decreased mobility,Decreased strength,Postural dysfunction  Visit Diagnosis: Muscle weakness (generalized)  Unsteadiness on feet  Difficulty in walking, not elsewhere classified  Other abnormalities of gait and mobility     Problem List Patient Active Problem List   Diagnosis Date Noted  . Dyslipidemia   . Chronic systolic congestive heart failure ([place])   . Acute ischemic cerebrovascular accident (CVA) involving anterior cerebral artery territory ([place]) 02/22/2020  . Mixed hyperlipidemia   . Tobacco abuse   .  Depression   . Acute stroke due to occlusion of right cerebellar artery ([place]) 02/20/2020  . Erythrocytosis 02/20/2020  . BPH (benign prostatic hyperplasia) 02/20/2020  . HNP (herniated nucleus pulposus), lumbar 01/13/2016  . Lumbar stenosis with neurogenic claudication 08/28/2015  . DOE (dyspnea on exertion) 05/01/2014  . Essential hypertension 05/01/2014  . Left shoulder pain 05/05/2011    Arliss Journey, PT, DPT  03/12/2020, 12:40 PM  [facility] [address] [place], [place], [address] Phone: [phone]   Fax:  [phone]  Name: Ruben Barrera MRN: [id number] Date of Birth: Aug 04, 1932

## 2020-03-13 ENCOUNTER — Telehealth: Payer: Self-pay

## 2020-03-13 NOTE — Telephone Encounter (Signed)
[facility] called stating patient is in a drug program through pharmacy to keep patient compliant. Need 90 day supply on Tamsulosin, Pravastatin, Losartan and Metoprolol #45 which is 90 day supply.

## 2020-03-14 ENCOUNTER — Other Ambulatory Visit: Payer: Self-pay

## 2020-03-14 ENCOUNTER — Ambulatory Visit: Payer: PPO

## 2020-03-14 ENCOUNTER — Encounter: Payer: Self-pay | Admitting: Occupational Therapy

## 2020-03-14 ENCOUNTER — Ambulatory Visit: Payer: PPO | Admitting: Occupational Therapy

## 2020-03-14 DIAGNOSIS — R41844 Frontal lobe and executive function deficit: Secondary | ICD-10-CM

## 2020-03-14 DIAGNOSIS — M6281 Muscle weakness (generalized): Secondary | ICD-10-CM

## 2020-03-14 DIAGNOSIS — R2689 Other abnormalities of gait and mobility: Secondary | ICD-10-CM

## 2020-03-14 DIAGNOSIS — R262 Difficulty in walking, not elsewhere classified: Secondary | ICD-10-CM

## 2020-03-14 DIAGNOSIS — R2681 Unsteadiness on feet: Secondary | ICD-10-CM

## 2020-03-14 DIAGNOSIS — R41840 Attention and concentration deficit: Secondary | ICD-10-CM

## 2020-03-14 DIAGNOSIS — I63541 Cerebral infarction due to unspecified occlusion or stenosis of right cerebellar artery: Secondary | ICD-10-CM | POA: Diagnosis not present

## 2020-03-14 NOTE — Therapy (Signed)
[facility] [address] [place], [place], [address] Phone: [phone]   Fax:  [phone]  Physical Therapy Treatment  Patient Details  Name: Ruben Barrera MRN: [id number] Date of Birth: 1932-05-02 Referring Provider (PT): Cathlyn Parsons, PA-C   Encounter Date: 03/14/2020   PT End of Session - 03/14/20 1536    Visit Number 3    Number of Visits 9    Date for PT Re-Evaluation 04/04/20    Authorization Type [employer]    PT Start Time 1530    PT Stop Time 1610    PT Time Calculation (min) 40 min    Equipment Utilized During Treatment Gait belt    Activity Tolerance Patient tolerated treatment well;Patient limited by pain;Patient limited by fatigue   seated rest breaks taken as necessary   Behavior During Therapy Impulsive;WFL for tasks assessed/performed           Past Medical History:  Diagnosis Date  . Arthritis   . Complication of anesthesia    "had the shivers" after surgery  . Depression   . Dysrhythmia    "extra heart beat"  . GERD (gastroesophageal reflux disease)   . History of hiatal hernia   . Hypercholesteremia   . Hypertension   . Lumbar spinal stenosis   . RLS (restless legs syndrome)    takes ativan as needed    Past Surgical History:  Procedure Laterality Date  . BACK SURGERY    . LUMBAR LAMINECTOMY/DECOMPRESSION MICRODISCECTOMY N/A 08/28/2015   Procedure: Lumbar Four-Five decompressive lumbar laminectomy;  Surgeon: Jovita Gamma, MD;  Location: [facility];  Service: Neurosurgery;  Laterality: N/A;  . REPLACEMENT TOTAL KNEE BILATERAL Bilateral 11/05/2003  . TOE AMPUTATION Right 2010   second toe  . TONSILLECTOMY      There were no vitals filed for this visit.   Subjective Assessment - 03/14/20 1533    Subjective Patient reports that back is feeling okay right now. Patient reports no new changes since last visit. Patient reports that he tripped over the dog. Is  walking with RW today during session.    Patient is accompained by: Family member   wife   Limitations Walking    How long can you walk comfortably? 2-3 loops down the drive way with the walker    Patient Stated Goals Walking outside on unlevel; get back to using the cane exclusively.    Currently in Pain? Yes    Pain Score 4     Pain Location Back    Pain Orientation Right;Lower    Pain Descriptors / Indicators Aching;Dull    Pain Type Chronic pain    Aggravating Factors  walk    Pain Relieving Factors back patch                  [facility] Adult PT Treatment/Exercise - 03/14/20 0001      Ambulation/Gait   Ambulation/Gait Yes    Ambulation/Gait Assistance 5: Supervision    Ambulation/Gait Assistance Details completed ambulation x 230 ft with RW. Increased in back pain 4-5/10. verbal cues for upright posture.    Ambulation Distance (Feet) 230 Feet    Assistive device Rolling walker    Gait Pattern Antalgic;Right flexed knee in stance;Left flexed knee in stance;Step-through pattern;Trunk flexed;Lateral trunk lean to right;Lateral trunk lean to left;Right foot flat;Left foot flat;Poor foot clearance - right;Poor foot clearance - left    Ambulation Surface Level;Indoor  Balance Exercises - 03/14/20 0001      Balance Exercises: Standing   Standing Eyes Opened Wide (BOA);Head turns;Foam/compliant surface;Limitations    Standing Eyes Opened Limitations completed horizontal/vertical head turns 1 x 10 reps each on airex. seated rest break after completion.    Standing Eyes Closed Wide (BOA);Foam/compliant surface;2 reps;30 secs;Limitations    Standing Eyes Closed Limitations airex completed with eyes closed 1 x 30 secs with wide BOS, progressed to slightly narrow BOS completed 2 x 30 secs.    SLS with Vectors Solid surface;1 rep;Limitations    SLS with Vectors Limitations completed alternating toe taps to 4" step x 10 reps. seated rest break required after completion.     Stepping Strategy Anterior;Posterior;Foam/compliant surface;UE support;Limitations    Stepping Strategy Limitations completed anterior/posterior stepping strategy on blue balance x 10 reps bilaterally with each direction. increased UE support required with completion. CGA throughout. verbal cues for improved step length on RLE.    Sidestepping Foam/compliant support;Upper extremity support;2 reps;Limitations    Sidestepping Limitations completed side stepping x 2 laps down and back, in // bars. verbal cues for improved step length with RLE.    Marching Foam/compliant surface;Intermittent upper extremity assist;Forwards;Limitations    Marching Limitations completed alternating marching forwards on blue mat in // bars, intermittent UE support and CGA.               PT Short Term Goals - 03/07/20 1528      PT SHORT TERM GOAL #1   Title Pt will be independent with initial HEP    Time 2    Period Weeks    Status New    Target Date 03/21/20      PT SHORT TERM GOAL #2   Title PT will be able to complete FGA and create appropriate goal within the next 1-2 visits    Time 2    Period Weeks    Status New    Target Date 03/21/20             PT Long Term Goals - 03/07/20 1531      PT LONG TERM GOAL #1   Title Pt will be independent with balance exercises at home    Time 4    Period Weeks    Status New    Target Date 04/04/20      PT LONG TERM GOAL #2   Title Pt will have improved gait speed by at least .4 ft/sec to demo minimal clinically important difference    Time 4    Period Weeks    Status New    Target Date 04/04/20      PT LONG TERM GOAL #3   Title Pt will have improved Berg Balance score to at least 45/56 to demo decreased fall risk    Baseline 42/56 on eval    Time 4    Period Weeks    Status New    Target Date 04/04/20      PT LONG TERM GOAL #4   Title Pt's FOTO score will remain at or above 70%    Time 4    Period Weeks    Status New    Target Date  04/04/20      PT LONG TERM GOAL #5   Title Pt will be able to demo safe mobility with LRAD outdoors on unlevel surfaces for >1000' for improved community mobility    Baseline Currently just making loops to the mailbox on level ground (Pt's major  goal is to return to his walking)    Time 4    Period Weeks    Status New    Target Date 04/04/20                 Plan - 03/14/20 1737    Clinical Impression Statement Today's session focused on balance actvities in // bars on complaint surfaces and reducing UE support required. Intermittent seated rest breaks required due to lower extremity fatigue and back pain. Overall patient tolerating activites well with addition of rest breaks. Patient ambulating with RW today during session. Will conitnue to progress toward LTGs.    Personal Factors and Comorbidities Age;Comorbidity 1;Comorbidity 2;Time since onset of injury/illness/exacerbation    Comorbidities chronic back pain from lumbar stenosis, HTN, arthritis    Examination-Activity Limitations Locomotion Level;Stand;Stairs;Squat    Examination-Participation Restrictions Community Activity;Driving;Yard Work    [employer] officer Evolving/Moderate complexity    Rehab Potential Good    PT Frequency 2x / week    PT Duration 4 weeks   plan for pt to schedule another 4 wks after he gets his Speech therapy consult   PT Treatment/Interventions ADLs/Self Care Home Management;Aquatic Therapy;Electrical Stimulation;Cryotherapy;Moist Heat;DME Instruction;Gait training;Stair training;Functional mobility training;Therapeutic activities;Therapeutic exercise;Balance training;Neuromuscular re-education;Iontophoresis 4mg /ml Dexamethasone;Patient/family education;Manual techniques;Passive range of motion;Dry needling;Taping;Vasopneumatic Device;Vestibular    PT Next Visit Plan gait training/dynamic gait, standing balance and dynamic balance in // bars. functional BLE strengthening (more so LLE>RLE).     Consulted and Agree with Plan of Care Patient           Patient will benefit from skilled therapeutic intervention in order to improve the following deficits and impairments:  Abnormal gait,Difficulty walking,Decreased activity tolerance,Pain,Decreased balance,Hypomobility,Impaired flexibility,Improper body mechanics,Decreased mobility,Decreased strength,Postural dysfunction  Visit Diagnosis: Unsteadiness on feet  Muscle weakness (generalized)  Difficulty in walking, not elsewhere classified  Other abnormalities of gait and mobility     Problem List Patient Active Problem List   Diagnosis Date Noted  . Dyslipidemia   . Chronic systolic congestive heart failure ([place])   . Acute ischemic cerebrovascular accident (CVA) involving anterior cerebral artery territory ([place]) 02/22/2020  . Mixed hyperlipidemia   . Tobacco abuse   . Depression   . Acute stroke due to occlusion of right cerebellar artery ([place]) 02/20/2020  . Erythrocytosis 02/20/2020  . BPH (benign prostatic hyperplasia) 02/20/2020  . HNP (herniated nucleus pulposus), lumbar 01/13/2016  . Lumbar stenosis with neurogenic claudication 08/28/2015  . DOE (dyspnea on exertion) 05/01/2014  . Essential hypertension 05/01/2014  . Left shoulder pain 05/05/2011    Jones Bales, PT, DPT 03/14/2020, 5:41 PM  [facility] [address] [place], [place], [address] Phone: [phone]   Fax:  [phone]  Name: RENN BUCHOLZ MRN: [id number] Date of Birth: Nov 14, 1932

## 2020-03-14 NOTE — Therapy (Signed)
[facility] [address] [place], [place], [address] Phone: [phone]   Fax:  [phone]  Occupational Therapy Treatment  Patient Details  Name: Ruben Barrera MRN: [id number] Date of Birth: 09-14-1932 Referring Provider (OT): Dr. Swartz/ Dr. Ranell Patrick   Encounter Date: 03/14/2020   OT End of Session - 03/14/20 1540    Visit Number 3    Number of Visits 25   will likely d/c after 8 weeks depending on progress.   Date for OT Re-Evaluation 05/30/20    Authorization Type [employer]    Authorization Time Period pt only scheduled for 2x week x 4 weeks, once ST order obtained- add an additional 2x week x 4 weeks for PT/OT as well as ST visits    Authorization - Visit Number 3    Authorization - Number of Visits 10    Progress Note Due on Visit 10    OT Start Time 6962    OT Stop Time 1530    OT Time Calculation (min) 44 min    Activity Tolerance Treatment limited secondary to agitation    Behavior During Therapy Impulsive;Restless           Past Medical History:  Diagnosis Date  . Arthritis   . Complication of anesthesia    "had the shivers" after surgery  . Depression   . Dysrhythmia    "extra heart beat"  . GERD (gastroesophageal reflux disease)   . History of hiatal hernia   . Hypercholesteremia   . Hypertension   . Lumbar spinal stenosis   . RLS (restless legs syndrome)    takes ativan as needed    Past Surgical History:  Procedure Laterality Date  . BACK SURGERY    . LUMBAR LAMINECTOMY/DECOMPRESSION MICRODISCECTOMY N/A 08/28/2015   Procedure: Lumbar Four-Five decompressive lumbar laminectomy;  Surgeon: Jovita Gamma, MD;  Location: [facility];  Service: Neurosurgery;  Laterality: N/A;  . REPLACEMENT TOTAL KNEE BILATERAL Bilateral 11/05/2003  . TOE AMPUTATION Right 2010   second toe  . TONSILLECTOMY      There were no vitals filed for this visit.   Subjective Assessment - 03/14/20  1450    Subjective  Pt reports he had a bad back spasm last night    Patient Stated Goals improve balance    Currently in Pain? Yes    Pain Score 4     Pain Location Back    Pain Descriptors / Indicators Aching    Pain Type Chronic pain    Pain Onset More than a month ago    Aggravating Factors  unknown    Pain Relieving Factors meds, back patch                    Treatment: Gripper set at level 2 to pick up 1 inch blocks with left and right UE's(1/2 blocks with each hand), min difficulty for sustained grip Distal finger control worksheet in prep for handwriting min difficulty, then copying sentences using larger coban grip grossly 75-90% legibility Pipe tree design x 2 reps, mod difficulty/ v.c, mod errors pt will need cues to organize by size next time he performs task  Arm bike x 6 mins level 3 for conditioning. Pt amb with walker today, min v.c required for walker safety          OT Short Term Goals - 03/07/20 1425      OT SHORT TERM GOAL #1   Title I with inital  HEP.    Time 4    Period Weeks    Status New    Target Date 04/04/20      OT SHORT TERM GOAL #2   Title Pt will increase bilateral grip strength by 5 lbs for increased ease with ADLs.    Time 4    Period Weeks    Status New      OT SHORT TERM GOAL #3   Title Pt will write a sentence with 90% legibility    Time 4    Period Weeks    Status New      OT SHORT TERM GOAL #4   Title Pt will demonstrate ability to sequence a functional task/ home management task demonstrating good safety awareness, with no more than min v.c    Time 4    Period Weeks    Status New      OT SHORT TERM GOAL #5   Title Pt will ambulate while perfoming a physical/ cognitive task, with 80% accuracy and no LOB.    Baseline decreased alternating attention, slowed processing    Time 4    Period Weeks    Status New      OT SHORT TERM GOAL #6   Title Further assess cognition in functional context and add goal prn.     Time 4    Period Weeks    Status New             OT Long Term Goals - 03/07/20 1430      OT LONG TERM GOAL #1   Title I with updated HEP for right UE proximal strength    Time 12    Period Weeks    Status New    Target Date 05/30/20      OT LONG TERM GOAL #2   Title Pt will increase standing functional reach to 10 inches or greater to minimize fall risk for ADLS.    Baseline R 8 inches, L 9 inches    Time 12    Period Weeks    Status New      OT LONG TERM GOAL #3   Title Pt will write a short paragraph with 100% legibility.    Baseline 75% at sentence level    Time 12    Period Weeks    Status New      OT LONG TERM GOAL #4   Title Pt will demonstrate ability to perform a physical and cognive task simultaneously with 90% or better accuracy and no LOB.    Time 12    Period Weeks    Status New      OT LONG TERM GOAL #5   Title Pt will sequence a functional task/ home managment task and complete modified independently.    Baseline slowed processing, difficulty following instructions decreased attention.    Time 12      Long Term Additional Goals   Additional Long Term Goals Yes      OT LONG TERM GOAL #6   Title Pt / wife will verbalize understanding of return to driving recommendations.    Time 12    Period Weeks    Status New                 Plan - 03/14/20 1540    Clinical Impression Statement Pt is progressing towards goals.   However, he demo decr awareness of deficits and can be impulsive at times.  Pt demonstrates improved ability to  write using a larger grip and printing.    OT Occupational Profile and History Detailed Assessment- Review of Records and additional review of physical, cognitive, psychosocial history related to current functional performance    Occupational performance deficits (Please refer to evaluation for details): ADL's;IADL's;Work;Leisure;Play;Social Participation    Body Structure / Function / Physical Skills ADL;Balance;UE  functional use;ROM;Gait;GMC;Decreased knowledge of precautions;Coordination;IADL;Decreased knowledge of use of DME;Strength;Dexterity;Mobility    Cognitive Skills Attention;Memory;Problem Solve;Safety Awareness;Sequencing;Temperament/Personality;Thought;Understand    Rehab Potential Good    Clinical Decision Making Limited treatment options, no task modification necessary    Comorbidities Affecting Occupational Performance: May have comorbidities impacting occupational performance    Modification or Assistance to Complete Evaluation  No modification of tasks or assist necessary to complete eval    OT Frequency 2x / week   plus eval anticipate d/c after 8 weeks   OT Duration 12 weeks    OT Treatment/Interventions Self-care/ADL training;Ultrasound;Energy conservation;Patient/family education;DME and/or AE instruction;Aquatic Therapy;Paraffin;Gait Training;Passive range of motion;Balance training;Fluidtherapy;Cryotherapy;Electrical Stimulation;Contrast Bath;Functional Mobility Training;Therapeutic activities;Manual Therapy;Therapeutic exercise;Moist Heat;Neuromuscular education;Cognitive remediation/compensation    Plan functional cognition, dynamic standing balance/ safety                                                                                                                                                                                                                                                                                      check to see if STorder received, if so schedule ST eval, pt is currently only scheduled for 4 weeks of OT/ PT, so once ST order recieved please add and addditonal 2x week x 4 weeks for OT/PT and add ST visits    Consulted and Agree with Plan of Care Patient           Patient will benefit from skilled therapeutic intervention in order to improve the following deficits and impairments:   Body Structure / Function / Physical Skills: ADL,Balance,UE functional  use,ROM,Gait,GMC,Decreased knowledge of precautions,Coordination,IADL,Decreased knowledge of use of DME,Strength,Dexterity,Mobility Cognitive Skills: Attention,Memory,Problem Solve,Safety Awareness,Sequencing,Temperament/Personality,Thought,Understand     Visit Diagnosis: Muscle weakness (generalized)  Frontal lobe and executive function deficit  Attention and concentration deficit  Other abnormalities of gait and mobility  Problem List Patient Active Problem List   Diagnosis Date Noted  . Dyslipidemia   . Chronic systolic congestive heart failure ([place])   . Acute ischemic cerebrovascular accident (CVA) involving anterior cerebral artery territory ([place]) 02/22/2020  . Mixed hyperlipidemia   . Tobacco abuse   . Depression   . Acute stroke due to occlusion of right cerebellar artery ([place]) 02/20/2020  . Erythrocytosis 02/20/2020  . BPH (benign prostatic hyperplasia) 02/20/2020  . HNP (herniated nucleus pulposus), lumbar 01/13/2016  . Lumbar stenosis with neurogenic claudication 08/28/2015  . DOE (dyspnea on exertion) 05/01/2014  . Essential hypertension 05/01/2014  . Left shoulder pain 05/05/2011    Byrd Terrero 03/14/2020, 3:41 PM  [facility] [address] [place], [place], [address] Phone: [phone]   Fax:  [phone]  Name: FRANKIE GOUDY MRN: [id number] Date of Birth: 1932/05/13

## 2020-03-19 ENCOUNTER — Encounter: Payer: Self-pay | Admitting: Internal Medicine

## 2020-03-19 ENCOUNTER — Ambulatory Visit (INDEPENDENT_AMBULATORY_CARE_PROVIDER_SITE_OTHER): Payer: PPO | Admitting: Internal Medicine

## 2020-03-19 ENCOUNTER — Other Ambulatory Visit: Payer: Self-pay

## 2020-03-19 VITALS — BP 122/64 | HR 87 | Ht 65.0 in | Wt 189.0 lb

## 2020-03-19 DIAGNOSIS — E782 Mixed hyperlipidemia: Secondary | ICD-10-CM | POA: Diagnosis not present

## 2020-03-19 DIAGNOSIS — I5022 Chronic systolic (congestive) heart failure: Secondary | ICD-10-CM

## 2020-03-19 DIAGNOSIS — I350 Nonrheumatic aortic (valve) stenosis: Secondary | ICD-10-CM | POA: Diagnosis not present

## 2020-03-19 MED ORDER — SPIRONOLACTONE 25 MG PO TABS: 12.5000 mg | ORAL_TABLET | Freq: Every day | ORAL | 3 refills | Status: DC

## 2020-03-19 NOTE — H&P (View-Only) (Signed)
Cardiology Office Note:    Date:  03/19/2020   ID:  Ruben Barrera, DOB 11/02/1932, MRN [id number]  PCP:  Lajean Manes, [facility] Cardiologist:  Ena Dawley, MD  [facility] Electrophysiologist:  None   CC: Hospital Follow up  History of Present Illness:    Ruben Barrera is a 86 y.o. male with a hx of HTN, HLD, HFrEF, Concern for LFLGAS, right CVA stroke, frequent PVCs, and Tobacco Abuse (occasional cigar) who presents for evaluation.   Patient notes that he is doing great, except for his low extremities which are terrible.  Patient had interval ED visit with visual and gait disturbance and found to have R cerebellar CVA.  Had frequent PVCs, start on metoprolol suppression.  Event monitor sent 03/14/20.   No chest pain or pressure.  No SOB/DOE and no PND/Orthopnea.  No weight gain or leg swelling.  No palpitations or syncope. Notes fatigue.  No hips and the legs pain despite multiple orthopedic surgeries.   No further visual disturbance, and notes fatigue.  Ambulatory blood pressure 132/86.  Past Medical History:  Diagnosis Date  . Arthritis   . Complication of anesthesia    "had the shivers" after surgery  . Depression   . Dysrhythmia    "extra heart beat"  . GERD (gastroesophageal reflux disease)   . History of hiatal hernia   . Hypercholesteremia   . Hypertension   . Lumbar spinal stenosis   . RLS (restless legs syndrome)    takes ativan as needed    Past Surgical History:  Procedure Laterality Date  . BACK SURGERY    . LUMBAR LAMINECTOMY/DECOMPRESSION MICRODISCECTOMY N/A 08/28/2015   Procedure: Lumbar Four-Five decompressive lumbar laminectomy;  Surgeon: Jovita Gamma, MD;  Location: [facility];  Service: Neurosurgery;  Laterality: N/A;  . REPLACEMENT TOTAL KNEE BILATERAL Bilateral 11/05/2003  . TOE AMPUTATION Right 2010   second toe  . TONSILLECTOMY      Current Medications: Current Meds  Medication Sig  . acetaminophen (TYLENOL) 325 MG  tablet Take 2 tablets (650 mg total) by mouth every 4 (four) hours as needed for mild pain (or temp > 37.5 C (99.5 F)).  Marland Kitchen aspirin EC 81 MG EC tablet Take 1 tablet (81 mg total) by mouth daily. Swallow whole.  . citalopram (CELEXA) 10 MG tablet Take 1 tablet (10 mg total) by mouth daily.  . DULoxetine (CYMBALTA) 20 MG capsule Take 1 capsule (20 mg total) by mouth daily.  Marland Kitchen ezetimibe (ZETIA) 10 MG tablet Take 1 tablet (10 mg total) by mouth every evening.  . fluticasone (FLONASE) 50 MCG/ACT nasal spray Place 1 spray into both nostrils daily as needed for allergies.   Marland Kitchen gabapentin (NEURONTIN) 600 MG tablet Take 1 tablet (600 mg total) by mouth 3 (three) times daily.  Marland Kitchen HYDROcodone-acetaminophen (NORCO) 7.5-325 MG tablet Take 1 tablet by mouth every 8 (eight) hours as needed for moderate pain.  Marland Kitchen loratadine (CLARITIN) 10 MG tablet Take 1 tablet by mouth daily in the afternoon.  Marland Kitchen LORazepam (ATIVAN) 1 MG tablet Take 1 tablet (1 mg total) by mouth every 8 (eight) hours as needed for anxiety.  . Magnesium 250 MG TABS Take 1 tablet by mouth daily in the afternoon.  . metoprolol succinate (TOPROL-XL) 25 MG 24 hr tablet Take 0.5 tablets (12.5 mg total) by mouth daily.  . MULTIPLE VITAMIN PO Take 1 tablet by mouth daily.   Marland Kitchen omeprazole (PRILOSEC) 20 MG capsule Take 20 mg by  mouth daily.  . pravastatin (PRAVACHOL) 20 MG tablet Take 1 tablet (20 mg total) by mouth daily at 6 PM.  . saccharomyces boulardii (FLORASTOR) 250 MG capsule Take 1 capsule by mouth daily in the afternoon.  . senna-docusate (SENOKOT-S) 8.6-50 MG tablet Take 1 tablet by mouth 2 (two) times daily.  Marland Kitchen spironolactone (ALDACTONE) 25 MG tablet Take 0.5 tablets (12.5 mg total) by mouth daily.  . tamsulosin (FLOMAX) 0.4 MG CAPS capsule Take 1 capsule (0.4 mg total) by mouth daily.  Marland Kitchen triamcinolone (KENALOG) 0.1 % Apply 1 application topically daily as needed.  . [DISCONTINUED] amLODipine (NORVASC) 2.5 MG tablet Take 1 tablet (2.5 mg total) by  mouth daily.     Allergies:   Simvastatin and Other   Social History   Socioeconomic History  . Marital status: Married    Spouse name: Not on file  . Number of children: Not on file  . Years of education: Not on file  . Highest education level: Not on file  Occupational History  . Not on file  Tobacco Use  . Smoking status: Current Every Day Smoker    Types: Cigars  . Smokeless tobacco: Never Used  . Tobacco comment: quit 1972  Vaping Use  . Vaping Use: Never used  Substance and Sexual Activity  . Alcohol use: Yes    Alcohol/week: 0.0 standard drinks    Comment: occasionally beer or scotch  . Drug use: No    Comment: 1 cigar per day, pt states he does not inhale   . Sexual activity: Not Currently  Other Topics Concern  . Not on file  Social History Narrative   Right handed   Lives with wife one story home   Social Determinants of Health   Financial Resource Strain: Not on file  Food Insecurity: Not on file  Transportation Needs: Not on file  Physical Activity: Not on file  Stress: Not on file  Social Connections: Not on file    Former CEO of [employer]  Family History: The patient's family history includes Hypertension in an other family member; Stroke in his mother.  No history of congestive heart failure  ROS:   Please see the history of present illness.    All other systems reviewed and are negative.  EKGs/Labs/Other Studies Reviewed:    The following studies were reviewed today:  EKG:   02/21/20:  Sinus Rhythm Rate 84 with Rare PVCs, and PACs, QTc 514 ms  Cardiac Event Monitoring: Pending  Transthoracic Echocardiogram: Date:02/21/20 Results: 1. Left ventricular ejection fraction, by estimation, is 30 to 35%. The  left ventricle has moderately decreased function. The left ventricle  demonstrates global hypokinesis. The left ventricular internal cavity size  was mildly dilated. There is severe  concentric left ventricular hypertrophy. Left  ventricular diastolic  parameters are indeterminate.  2. Right ventricular systolic function is normal. The right ventricular  size is normal.  3. Left atrial size was severely dilated.  4. The mitral valve is grossly normal. Mild mitral valve regurgitation.  5. The aortic valve is calcified. Aortic valve regurgitation is not  visualized. Mild to moderate aortic valve stenosis. Aortic valve mean  gradient measures 25.0 mmHg.   FINDINGS  Left Ventricle: LVMI 197 g/m2 RWT 0.54. Left ventricular ejection fraction, by estimation, is 30 to 35%.  The left ventricle has moderately decreased function. The left ventricle  demonstrates global hypokinesis. The left ventricular internal cavity size  was mildly dilated. There is severe concentric left ventricular hypertrophy.  Left ventricular diastolic parameters are indeterminate.   NM Stress Testing : Date: 02/21/2020 Results:  Notes Recorded by Dorothy Spark, MD on 05/10/2014 at 2:50 PM Normal stress test  Recent Labs: 02/23/2020: ALT 22; BUN 22; Creatinine, Ser 1.17; Hemoglobin 16.5; Magnesium 2.2; Platelets 259; Potassium 3.7; Sodium 138; TSH 0.757  Recent Lipid Panel    Component Value Date/Time   CHOL 204 (H) 02/21/2020 0253   TRIG 199 (H) 02/21/2020 0253   HDL 42 02/21/2020 0253   CHOLHDL 4.9 02/21/2020 0253   VLDL 40 02/21/2020 0253   LDLCALC 122 (H) 02/21/2020 0253    Risk Assessment/Calculations:     N/A  Physical Exam:    VS:  BP 122/64   Pulse 87   Ht 5\' 5"  (1.651 m)   Wt 189 lb (85.7 kg)   SpO2 96%   BMI 31.45 kg/m     Wt Readings from Last 3 Encounters:  03/19/20 189 lb (85.7 kg)  03/05/20 190 lb (86.2 kg)  02/22/20 181 lb 1.6 oz (82.1 kg)    GEN: Well nourished, well developed in no acute distress HEENT: Normal NECK: No JVD; No carotid bruits LYMPHATICS: No lymphadenopathy CARDIAC: RRR, III/VI Systolic crescendo murmur without rubs or gallops, late S2 RESPIRATORY:  Clear to auscultation without  rales, wheezing or rhonchi  ABDOMEN: Soft, non-tender, non-distended MUSCULOSKELETAL:  No edema; No deformity  SKIN: Warm and dry NEUROLOGIC:  Alert and oriented x 3 PSYCHIATRIC:  Normal affect   ASSESSMENT:    1. Chronic systolic congestive heart failure ([place])   2. Nonrheumatic aortic valve stenosis   3. Mixed hyperlipidemia    PLAN:    In order of problems listed above:  Heart Failure Reduced Ejection Fraction  Low Flow Low Gradient AS - NYHA class I, Stage B, euvolemic, etiology unclear - Diuretic regimen: euvolemic will defer - Strict I/Os, daily weights, and fluid restriction of < 2 L  - BMP/BNP/Mg  - continue 12.5 mg metoprolol XL - Will start aldactone 12.5 mg PO daily - will DC amlodipine - will continue ASA - will refer to cardiac rehab - would recommend LHC and RHC Risks and benefits of cardiac catheterization have been discussed with the patient.  These include bleeding, infection, kidney damage, stroke, heart attack, death.  The patient understands these risks and is willing to proceed.  Hyperlipidemia (mixed) -multiple statin intolerances -LDL goal less than 100 -continue Zetia and pravastatin -Recheck lipid profile LFTs in future visits - gave education on dietary changes  Time Spent Directly with Patient:   I have spent a total of 60 minutes with the patient reviewing hospital notes, imaging, EKGs, labs and examining the patient as well as establishing an assessment and plan that was discussed personally with the patient, wife, and son.  > 50% of time was spent in direct patient care.   Shared Decision Making/Informed Consent The risks [stroke (1 in 1000), death (1 in 1000), kidney failure [usually temporary] (1 in 500), bleeding (1 in 200), allergic reaction [possibly serious] (1 in 200)], benefits (diagnostic support and management of coronary artery disease) and alternatives of a cardiac catheterization were discussed in detail with Ruben Barrera and he is  willing to proceed.        Medication Adjustments/Labs and Tests Ordered: Current medicines are reviewed at length with the patient today.  Concerns regarding medicines are outlined above.  Orders Placed This Encounter  Procedures  . Basic metabolic panel  . Pro b natriuretic peptide (BNP)  .  Magnesium  . CBC   Meds ordered this encounter  Medications  . spironolactone (ALDACTONE) 25 MG tablet    Sig: Take 0.5 tablets (12.5 mg total) by mouth daily.    Dispense:  45 tablet    Refill:  3    Patient Instructions  Medication Instructions:  Your physician has recommended you make the following change in your medication:  1.  STOP Amlodipine 2.  START Aldactone (Spironolactone) 25 mg taking only 1/2 tablet daily   *If you need a refill on your cardiac medications before your next appointment, please call your pharmacy*   Lab Work: TODAY:  BMET, PRO BNP, MAG, & CBC  If you have labs (blood work) drawn today and your tests are completely normal, you will receive your results only by: Marland Kitchen MyChart Message (if you have MyChart) OR . A paper copy in the mail If you have any lab test that is abnormal or we need to change your treatment, we will call you to review the results.   Testing/Procedures: Your physician has requested that you have a cardiac catheterization. Cardiac catheterization is used to diagnose and/or treat various heart conditions. Doctors may recommend this procedure for a number of different reasons. The most common reason is to evaluate chest pain. Chest pain can be a symptom of coronary artery disease (CAD), and cardiac catheterization can show whether plaque is narrowing or blocking your heart's arteries. This procedure is also used to evaluate the valves, as well as measure the blood flow and oxygen levels in different parts of your heart. For further information please visit [email or web address] Please follow instruction sheet, BELOW:     [facility] CARDIOVASCULAR DIVISION [facility] OFFICE [address] [place], [address] [place] [address] Dept: [phone] Loc: [phone]  Ruben Barrera  03/19/2020  You are scheduled for a Cardiac Catheterization on Thursday, January 6 with Dr. Verne Carrow.  1. Please arrive at the [facility] (Main Entrance A) at [facility]: [address] [place], [place] [address] at 7:00 AM (This time is two hours before your procedure to ensure your preparation). Free valet parking service is available.   Special note: Every effort is made to have your procedure done on time. Please understand that emergencies sometimes delay scheduled procedures.  2. Diet: Do not eat solid foods after midnight.  The patient may have clear liquids until 5am upon the day of the procedure.  3. Labs: You will need to have blood drawn on TODAY  4. Medication instructions in preparation for your procedure:  HOLD THE ALDACTONE THE MORNING OF YOUR PROCEDURE   Contrast Allergy: No   Due to recent COVID-19 restrictions implemented by our local and state authorities and in an effort to keep both patients and staff as safe as possible, our hospital system requires COVID-19 testing prior to certain scheduled hospital procedures.  Please go to [address]. [place], [place] [address] on 03/26/2020 at 11:30 am  .  This is a drive up testing site.  You will not need to exit your vehicle.  You will not be billed at the time of testing but may receive a bill later depending on your insurance. You must agree to self-quarantine from the time of your testing until the procedure date on 01/26/21.  This should included staying home with ONLY the people you live with.  Avoid take-out, grocery store shopping or leaving the house for any non-emergent reason.  Failure  to have your COVID-19 test done on the date and time you have been scheduled will result in cancellation of your procedure.  Please call  our office at [phone] if you have any questions.   On the morning of your procedure, take your Aspirin and any morning medicines NOT listed above.  You may use sips of water.  5. Plan for one night stay--bring personal belongings. 6. Bring a current list of your medications and current insurance cards. 7. You MUST have a responsible person to drive you home. 8. Someone MUST be with you the first 24 hours after you arrive home or your discharge will be delayed. 9. Please wear clothes that are easy to get on and off and wear slip-on shoes.  Thank you for allowing [place] to care for you!   -- [facility]     Follow-Up: At [facility], you and your health needs are our priority.  As part of our continuing mission to provide you with exceptional heart care, we have created designated Provider Care Teams.  These Care Teams include your primary Cardiologist (physician) and Advanced Practice Providers (APPs -  Physician Assistants and Nurse Practitioners) who all work together to provide you with the care you need, when you need it.  We recommend signing up for the patient portal called "MyChart".  Sign up information is provided on this After Visit Summary.  MyChart is used to connect with patients for Virtual Visits (Telemedicine).  Patients are able to view lab/test results, encounter notes, upcoming appointments, etc.  Non-urgent messages can be sent to your provider as well.   To learn more about what you can do with MyChart, go to [email or web address].    Your next appointment:   2 week(s) after cath : 04/15/2020 ARRIVE AT 2:25  The format for your next appointment:   In Person  Provider:   Rudean Haskell, MD   Other Instructions      Signed, Werner Lean, MD  03/19/2020 3:04 PM    [place]

## 2020-03-19 NOTE — Progress Notes (Signed)
NEUROLOGY FOLLOW UP OFFICE NOTE  Ruben Barrera [id number]   Subjective:  Ruben Barrera is an 86 year old right-handed male with HTN, HLD, cigar smoker, and lumbar stenosis who follows up for bilateral leg numbness and new recent stroke.  History supplemented by hospital records.  CT head, MRI brain, and CTA of head and neck from hospitalization personally reviewed.  UPDATE He was admitted to [facility] on 02/20/2020 for cerebellar stroke, presenting with recurrent double vision and difficulty writing his name.  CT of head showed hypodensity in the right cerebellar peduncle.  Follow up MRI of brain showed possible subacute rounded infarct in the right cerebellar peduncle.  MRI of brain with contrast demonstrated no abnormal enhancement.  CTA of head and neck showed 70% stenosis at the left ICA origin and high-grade right P2 and left P3 stenosis.  2D echocardiogram showed EF 30-35% with global hypokinesis.  LDL was 122 and Hgb A1c 5.6.  He was started on ASA 81mg  and Plavix 75mg  daily for 3 weeks followed by ASA alone.  Was on Zetia.  Unable to tolerate simvastatin.  In hospital, low-dose pravastatin 20mg  was added.  Cardiology consulted for ischemic workup and outpatient cardiac monitoring to rule out a fib.  Vascular surgery and cardiology follow up was ordered.  Cardiac event monitor was performed but results still pending.  He has upcoming cardiac cath ordered.  Overall, symptoms have resolved.  During the day, when he is tired, he may slur his speech and feel a little more off-balance.   HISTORY:   Initially saw him for bilateral lower extremity numbness.  Patient has chronic low back pain and spinal stenosis status post PLIF L4-5 and has had numerous lumbar spine MRIs.  Last MRI on 08/20/2018 personally reviewed showed severe spinal canal and bi foraminal stenosis at L3-4, right foraminal stenosis at L1-2 and PLIF L4-5 without significant stenosis. Following his second COVID vaccine,  he developed worsening bilateral sciatic pain and weakness.  He also developed numbness of both feet as well as burning, which is worse when standing on hard surfaces and improved when barefoot or when he is off his feet.  During that time, he exhibited dizziness and slurred speech and went to the ED on 05/24/2019 where MRI of brain was negative for acute infarct and CTA of head and neck showed no hemodynamically significant stenosis or occlusion.  It was thought to be a reaction to the vaccine.  He had an epidural injection on 05/26/2019 which was ineffective.  He was sent to pain management where he was started on hydrocodone and gabapentin.  He underwent a second injection in early June, which helped the radicular pain but continues to have numbness and burning in the feet.  He reportedly was checked for diabetes and his number was "mildly elevated".  Neuropathy labs from June 2021 included negative ANA, sed rate 5, B12 398, folate >24.8, SPEP/IFE negative for monoclonal protein.  NCV-EMG in August showed chronic bilateral L5-S1 radiculopathy and chronic right L3-4 radiculopathy with no evidence of large fiber sensorimotor polyneuropathy.     PAST MEDICAL HISTORY: Past Medical History:  Diagnosis Date  . Arthritis   . Complication of anesthesia    "had the shivers" after surgery  . Depression   . Dysrhythmia    "extra heart beat"  . GERD (gastroesophageal reflux disease)   . History of hiatal hernia   . Hypercholesteremia   . Hypertension   . Lumbar spinal stenosis   .  RLS (restless legs syndrome)    takes ativan as needed    MEDICATIONS: Current Outpatient Medications on File Prior to Visit  Medication Sig Dispense Refill  . acetaminophen (TYLENOL) 325 MG tablet Take 2 tablets (650 mg total) by mouth every 4 (four) hours as needed for mild pain (or temp > 37.5 C (99.5 F)).    Marland Kitchen amLODipine (NORVASC) 2.5 MG tablet Take 1 tablet (2.5 mg total) by mouth daily. 30 tablet 0  . aspirin EC 81 MG  EC tablet Take 1 tablet (81 mg total) by mouth daily. Swallow whole. 30 tablet 11  . citalopram (CELEXA) 10 MG tablet Take 1 tablet (10 mg total) by mouth daily. 30 tablet 0  . DULoxetine (CYMBALTA) 20 MG capsule Take 1 capsule (20 mg total) by mouth daily. 30 capsule 1  . ezetimibe (ZETIA) 10 MG tablet Take 1 tablet (10 mg total) by mouth every evening. 30 tablet 0  . fluticasone (FLONASE) 50 MCG/ACT nasal spray Place 1 spray into both nostrils daily as needed for allergies.     Marland Kitchen gabapentin (NEURONTIN) 600 MG tablet Take 1 tablet (600 mg total) by mouth 3 (three) times daily. 90 tablet 0  . HYDROcodone-acetaminophen (NORCO) 7.5-325 MG tablet Take 1 tablet by mouth every 8 (eight) hours as needed for moderate pain. 20 tablet 0  . loratadine (CLARITIN) 10 MG tablet Take 1 tablet by mouth daily in the afternoon.    Marland Kitchen LORazepam (ATIVAN) 1 MG tablet Take 1 tablet (1 mg total) by mouth every 8 (eight) hours as needed for anxiety. 10 tablet 0  . losartan (COZAAR) 25 MG tablet Take 0.5 tablets (12.5 mg total) by mouth daily. 30 tablet 0  . Magnesium 250 MG TABS Take 1 tablet by mouth daily in the afternoon.    . metoprolol succinate (TOPROL-XL) 25 MG 24 hr tablet Take 0.5 tablets (12.5 mg total) by mouth daily. 30 tablet 0  . MULTIPLE VITAMIN PO Take 1 tablet by mouth daily.     Marland Kitchen omeprazole (PRILOSEC) 20 MG capsule Take 20 mg by mouth daily.    . pravastatin (PRAVACHOL) 20 MG tablet Take 1 tablet (20 mg total) by mouth daily at 6 PM. 30 tablet 0  . senna-docusate (SENOKOT-S) 8.6-50 MG tablet Take 1 tablet by mouth 2 (two) times daily.    . tamsulosin (FLOMAX) 0.4 MG CAPS capsule Take 1 capsule (0.4 mg total) by mouth daily. 30 capsule 0   No current facility-administered medications on file prior to visit.    ALLERGIES: Allergies  Allergen Reactions  . Simvastatin Other (See Comments)    Leg pains   . Other Other (See Comments)    UNSPECIFIED REACTION  Reaction to anesthesia per patient     FAMILY HISTORY: Family History  Problem Relation Age of Onset  . Stroke Mother   . Hypertension Other        family history   SOCIAL HISTORY: Social History   Socioeconomic History  . Marital status: Married    Spouse name: Not on file  . Number of children: Not on file  . Years of education: Not on file  . Highest education level: Not on file  Occupational History  . Not on file  Tobacco Use  . Smoking status: Current Every Day Smoker    Types: Cigars  . Smokeless tobacco: Never Used  . Tobacco comment: quit 1972  Vaping Use  . Vaping Use: Never used  Substance and Sexual Activity  . Alcohol  use: Yes    Alcohol/week: 0.0 standard drinks    Comment: occasionally beer or scotch  . Drug use: No    Comment: 1 cigar per day, pt states he does not inhale   . Sexual activity: Not Currently  Other Topics Concern  . Not on file  Social History Narrative   Right handed   Lives with wife one story home   Social Determinants of Health   Financial Resource Strain: Not on file  Food Insecurity: Not on file  Transportation Needs: Not on file  Physical Activity: Not on file  Stress: Not on file  Social Connections: Not on file  Intimate Partner Violence: Not on file     Objective:  Blood pressure 122/74, pulse 80, height 5\' 5"  (1.651 m), weight 186 lb 6.4 oz (84.6 kg), SpO2 95 %. General: No acute distress.  Patient appears well-groomed.   Head:  Normocephalic/atraumatic Eyes:  Fundi examined but not visualized Neck: supple, no paraspinal tenderness, full range of motion Heart:  Regular rate and rhythm Lungs:  Clear to auscultation bilaterally Back: No paraspinal tenderness Neurological Exam: alert and oriented to person, place, and time. Attention span and concentration intact, recent and remote memory intact, fund of knowledge intact.  Speech fluent and not dysarthric, language intact.  CN II-XII intact. Bulk and tone normal, muscle strength 5/5 throughout.   Sensation to pinprick intact, vibratory sensation reduced in feet.  Deep tendon reflexes 2+ upper extremities, absent lower extremities, toes downgoing.  Finger to nose and heel to shin testing intact.  Wide based antalgic gait.  Romberg negative.   Assessment/Plan:   1.  Right cerebellar peduncle stroke, most likely secondary to small vessel disease. 2.  Cardiomyopathy/CHF 3.  Hyperlipidemia 4.  Hypertension 5.  Tobacco abuse 6.  Lumbar spinal stenosis 7.  Asymptomatic left internal carotid artery stenosis  Secondary stroke prevention as managed by PCP/cardiology: -  ASA 81mg  daily -  Prvastatin 20mg .  LDL goal less than 70 -  Blood pressure control -  Glycemic control.  Hgb A1c goal less than 7 -  Smoking cessation  Due to his severe chronic pain due to lumbar stenosis, he is supposed to be having a spinal stimulator implant.  I explained that I typically recommend waiting 6 months after stroke for any non-emergent surgeries.  It would require discontinuing aspirin prior to surgery, which can increase risk for a stroke.  However, I appreciate that he suffers from a great deal of pain.  I am okay with surgery as long as he and his family know the risks and benefits.  The patient and his family acknowledge the risks.  Follow up 6 months.  Ruben Clines, DO  CC: Lajean Manes, MD

## 2020-03-19 NOTE — Patient Instructions (Signed)
Medication Instructions:  Your physician has recommended you make the following change in your medication:  1.  STOP Amlodipine 2.  START Aldactone (Spironolactone) 25 mg taking only 1/2 tablet daily   *If you need a refill on your cardiac medications before your next appointment, please call your pharmacy*   Lab Work: TODAY:  BMET, PRO BNP, MAG, & CBC  If you have labs (blood work) drawn today and your tests are completely normal, you will receive your results only by: Marland Kitchen MyChart Message (if you have MyChart) OR . A paper copy in the mail If you have any lab test that is abnormal or we need to change your treatment, we will call you to review the results.   Testing/Procedures: Your physician has requested that you have a cardiac catheterization. Cardiac catheterization is used to diagnose and/or treat various heart conditions. Doctors may recommend this procedure for a number of different reasons. The most common reason is to evaluate chest pain. Chest pain can be a symptom of coronary artery disease (CAD), and cardiac catheterization can show whether plaque is narrowing or blocking your heart's arteries. This procedure is also used to evaluate the valves, as well as measure the blood flow and oxygen levels in different parts of your heart. For further information please visit [email or web address] Please follow instruction sheet, BELOW:     [facility] CARDIOVASCULAR DIVISION [facility] OFFICE [address] [place], [address] [place] [address] Dept: [phone] Loc: [phone]  Ruben Barrera  03/19/2020  You are scheduled for a Cardiac Catheterization on Thursday, January 6 with Dr. Verne Carrow.  1. Please arrive at the [facility] (Main Entrance A) at [facility]: [address] [place], [place] [address] at 7:00 AM (This time is two hours before your procedure to ensure your preparation). Free valet parking service is  available.   Special note: Every effort is made to have your procedure done on time. Please understand that emergencies sometimes delay scheduled procedures.  2. Diet: Do not eat solid foods after midnight.  The patient may have clear liquids until 5am upon the day of the procedure.  3. Labs: You will need to have blood drawn on TODAY  4. Medication instructions in preparation for your procedure:  HOLD THE ALDACTONE THE MORNING OF YOUR PROCEDURE   Contrast Allergy: No   Due to recent COVID-19 restrictions implemented by our local and state authorities and in an effort to keep both patients and staff as safe as possible, our hospital system requires COVID-19 testing prior to certain scheduled hospital procedures.  Please go to [address] [place], [place] [address] on 03/26/2020 at 11:30 am  .  This is a drive up testing site.  You will not need to exit your vehicle.  You will not be billed at the time of testing but may receive a bill later depending on your insurance. You must agree to self-quarantine from the time of your testing until the procedure date on 01/26/21.  This should included staying home with ONLY the people you live with.  Avoid take-out, grocery store shopping or leaving the house for any non-emergent reason.  Failure to have your COVID-19 test done on the date and time you have been scheduled will result in cancellation of your procedure.  Please call our office at [phone] if you have any questions.   On the morning of your procedure, take your Aspirin and any morning medicines NOT listed  above.  You may use sips of water.  5. Plan for one night stay--bring personal belongings. 6. Bring a current list of your medications and current insurance cards. 7. You MUST have a responsible person to drive you home. 8. Someone MUST be with you the first 24 hours after you arrive home or your discharge will be delayed. 9. Please wear clothes that are easy to get on and off and  wear slip-on shoes.  Thank you for allowing [place] to care for you!   -- [facility]     Follow-Up: At [facility], you and your health needs are our priority.  As part of our continuing mission to provide you with exceptional heart care, we have created designated Provider Care Teams.  These Care Teams include your primary Cardiologist (physician) and Advanced Practice Providers (APPs -  Physician Assistants and Nurse Practitioners) who all work together to provide you with the care you need, when you need it.  We recommend signing up for the patient portal called "MyChart".  Sign up information is provided on this After Visit Summary.  MyChart is used to connect with patients for Virtual Visits (Telemedicine).  Patients are able to view lab/test results, encounter notes, upcoming appointments, etc.  Non-urgent messages can be sent to your provider as well.   To learn more about what you can do with MyChart, go to [email or web address].    Your next appointment:   2 week(s) after cath : 04/15/2020 ARRIVE AT 2:25  The format for your next appointment:   In Person  Provider:   Rudean Haskell, MD   Other Instructions

## 2020-03-19 NOTE — Progress Notes (Signed)
Cardiology Office Note:    Date:  03/19/2020   ID:  Ruben Barrera, DOB 08/12/1932, MRN [id number]  PCP:  Lajean Manes, [facility] Cardiologist:  Ena Dawley, MD  [facility] Electrophysiologist:  None   CC: Hospital Follow up  History of Present Illness:    Ruben Barrera is a 86 y.o. male with a hx of HTN, HLD, HFrEF, Concern for LFLGAS, right CVA stroke, frequent PVCs, and Tobacco Abuse (occasional cigar) who presents for evaluation.   Patient notes that he is doing great, except for his low extremities which are terrible.  Patient had interval ED visit with visual and gait disturbance and found to have R cerebellar CVA.  Had frequent PVCs, start on metoprolol suppression.  Event monitor sent 03/14/20.   No chest pain or pressure.  No SOB/DOE and no PND/Orthopnea.  No weight gain or leg swelling.  No palpitations or syncope. Notes fatigue.  No hips and the legs pain despite multiple orthopedic surgeries.   No further visual disturbance, and notes fatigue.  Ambulatory blood pressure 132/86.  Past Medical History:  Diagnosis Date  . Arthritis   . Complication of anesthesia    "had the shivers" after surgery  . Depression   . Dysrhythmia    "extra heart beat"  . GERD (gastroesophageal reflux disease)   . History of hiatal hernia   . Hypercholesteremia   . Hypertension   . Lumbar spinal stenosis   . RLS (restless legs syndrome)    takes ativan as needed    Past Surgical History:  Procedure Laterality Date  . BACK SURGERY    . LUMBAR LAMINECTOMY/DECOMPRESSION MICRODISCECTOMY N/A 08/28/2015   Procedure: Lumbar Four-Five decompressive lumbar laminectomy;  Surgeon: Jovita Gamma, MD;  Location: [facility];  Service: Neurosurgery;  Laterality: N/A;  . REPLACEMENT TOTAL KNEE BILATERAL Bilateral 11/05/2003  . TOE AMPUTATION Right 2010   second toe  . TONSILLECTOMY      Current Medications: Current Meds  Medication Sig  . acetaminophen (TYLENOL) 325 MG  tablet Take 2 tablets (650 mg total) by mouth every 4 (four) hours as needed for mild pain (or temp > 37.5 C (99.5 F)).  Marland Kitchen aspirin EC 81 MG EC tablet Take 1 tablet (81 mg total) by mouth daily. Swallow whole.  . citalopram (CELEXA) 10 MG tablet Take 1 tablet (10 mg total) by mouth daily.  . DULoxetine (CYMBALTA) 20 MG capsule Take 1 capsule (20 mg total) by mouth daily.  Marland Kitchen ezetimibe (ZETIA) 10 MG tablet Take 1 tablet (10 mg total) by mouth every evening.  . fluticasone (FLONASE) 50 MCG/ACT nasal spray Place 1 spray into both nostrils daily as needed for allergies.   Marland Kitchen gabapentin (NEURONTIN) 600 MG tablet Take 1 tablet (600 mg total) by mouth 3 (three) times daily.  Marland Kitchen HYDROcodone-acetaminophen (NORCO) 7.5-325 MG tablet Take 1 tablet by mouth every 8 (eight) hours as needed for moderate pain.  Marland Kitchen loratadine (CLARITIN) 10 MG tablet Take 1 tablet by mouth daily in the afternoon.  Marland Kitchen LORazepam (ATIVAN) 1 MG tablet Take 1 tablet (1 mg total) by mouth every 8 (eight) hours as needed for anxiety.  . Magnesium 250 MG TABS Take 1 tablet by mouth daily in the afternoon.  . metoprolol succinate (TOPROL-XL) 25 MG 24 hr tablet Take 0.5 tablets (12.5 mg total) by mouth daily.  . MULTIPLE VITAMIN PO Take 1 tablet by mouth daily.   Marland Kitchen omeprazole (PRILOSEC) 20 MG capsule Take 20 mg by  mouth daily.  . pravastatin (PRAVACHOL) 20 MG tablet Take 1 tablet (20 mg total) by mouth daily at 6 PM.  . saccharomyces boulardii (FLORASTOR) 250 MG capsule Take 1 capsule by mouth daily in the afternoon.  . senna-docusate (SENOKOT-S) 8.6-50 MG tablet Take 1 tablet by mouth 2 (two) times daily.  Marland Kitchen spironolactone (ALDACTONE) 25 MG tablet Take 0.5 tablets (12.5 mg total) by mouth daily.  . tamsulosin (FLOMAX) 0.4 MG CAPS capsule Take 1 capsule (0.4 mg total) by mouth daily.  Marland Kitchen triamcinolone (KENALOG) 0.1 % Apply 1 application topically daily as needed.  . [DISCONTINUED] amLODipine (NORVASC) 2.5 MG tablet Take 1 tablet (2.5 mg total) by  mouth daily.     Allergies:   Simvastatin and Other   Social History   Socioeconomic History  . Marital status: Married    Spouse name: Not on file  . Number of children: Not on file  . Years of education: Not on file  . Highest education level: Not on file  Occupational History  . Not on file  Tobacco Use  . Smoking status: Current Every Day Smoker    Types: Cigars  . Smokeless tobacco: Never Used  . Tobacco comment: quit 1972  Vaping Use  . Vaping Use: Never used  Substance and Sexual Activity  . Alcohol use: Yes    Alcohol/week: 0.0 standard drinks    Comment: occasionally beer or scotch  . Drug use: No    Comment: 1 cigar per day, pt states he does not inhale   . Sexual activity: Not Currently  Other Topics Concern  . Not on file  Social History Narrative   Right handed   Lives with wife one story home   Social Determinants of Health   Financial Resource Strain: Not on file  Food Insecurity: Not on file  Transportation Needs: Not on file  Physical Activity: Not on file  Stress: Not on file  Social Connections: Not on file    Former CEO of [employer]  Family History: The patient's family history includes Hypertension in an other family member; Stroke in his mother.  No history of congestive heart failure  ROS:   Please see the history of present illness.    All other systems reviewed and are negative.  EKGs/Labs/Other Studies Reviewed:    The following studies were reviewed today:  EKG:   02/21/20:  Sinus Rhythm Rate 84 with Rare PVCs, and PACs, QTc 514 ms  Cardiac Event Monitoring: Pending  Transthoracic Echocardiogram: Date:02/21/20 Results: 1. Left ventricular ejection fraction, by estimation, is 30 to 35%. The  left ventricle has moderately decreased function. The left ventricle  demonstrates global hypokinesis. The left ventricular internal cavity size  was mildly dilated. There is severe  concentric left ventricular hypertrophy. Left  ventricular diastolic  parameters are indeterminate.  2. Right ventricular systolic function is normal. The right ventricular  size is normal.  3. Left atrial size was severely dilated.  4. The mitral valve is grossly normal. Mild mitral valve regurgitation.  5. The aortic valve is calcified. Aortic valve regurgitation is not  visualized. Mild to moderate aortic valve stenosis. Aortic valve mean  gradient measures 25.0 mmHg.   FINDINGS  Left Ventricle: LVMI 197 g/m2 RWT 0.54. Left ventricular ejection fraction, by estimation, is 30 to 35%.  The left ventricle has moderately decreased function. The left ventricle  demonstrates global hypokinesis. The left ventricular internal cavity size  was mildly dilated. There is severe concentric left ventricular hypertrophy.  Left ventricular diastolic parameters are indeterminate.   NM Stress Testing : Date: 02/21/2020 Results:  Notes Recorded by Dorothy Spark, MD on 05/10/2014 at 2:50 PM Normal stress test  Recent Labs: 02/23/2020: ALT 22; BUN 22; Creatinine, Ser 1.17; Hemoglobin 16.5; Magnesium 2.2; Platelets 259; Potassium 3.7; Sodium 138; TSH 0.757  Recent Lipid Panel    Component Value Date/Time   CHOL 204 (H) 02/21/2020 0253   TRIG 199 (H) 02/21/2020 0253   HDL 42 02/21/2020 0253   CHOLHDL 4.9 02/21/2020 0253   VLDL 40 02/21/2020 0253   LDLCALC 122 (H) 02/21/2020 0253    Risk Assessment/Calculations:     N/A  Physical Exam:    VS:  BP 122/64   Pulse 87   Ht 5\' 5"  (1.651 m)   Wt 189 lb (85.7 kg)   SpO2 96%   BMI 31.45 kg/m     Wt Readings from Last 3 Encounters:  03/19/20 189 lb (85.7 kg)  03/05/20 190 lb (86.2 kg)  02/22/20 181 lb 1.6 oz (82.1 kg)    GEN: Well nourished, well developed in no acute distress HEENT: Normal NECK: No JVD; No carotid bruits LYMPHATICS: No lymphadenopathy CARDIAC: RRR, III/VI Systolic crescendo murmur without rubs or gallops, late S2 RESPIRATORY:  Clear to auscultation without  rales, wheezing or rhonchi  ABDOMEN: Soft, non-tender, non-distended MUSCULOSKELETAL:  No edema; No deformity  SKIN: Warm and dry NEUROLOGIC:  Alert and oriented x 3 PSYCHIATRIC:  Normal affect   ASSESSMENT:    1. Chronic systolic congestive heart failure ([place])   2. Nonrheumatic aortic valve stenosis   3. Mixed hyperlipidemia    PLAN:    In order of problems listed above:  Heart Failure Reduced Ejection Fraction  Low Flow Low Gradient AS - NYHA class I, Stage B, euvolemic, etiology unclear - Diuretic regimen: euvolemic will defer - Strict I/Os, daily weights, and fluid restriction of < 2 L  - BMP/BNP/Mg  - continue 12.5 mg metoprolol XL - Will start aldactone 12.5 mg PO daily - will DC amlodipine - will continue ASA - will refer to cardiac rehab - would recommend LHC and RHC Risks and benefits of cardiac catheterization have been discussed with the patient.  These include bleeding, infection, kidney damage, stroke, heart attack, death.  The patient understands these risks and is willing to proceed.  Hyperlipidemia (mixed) -multiple statin intolerances -LDL goal less than 100 -continue Zetia and pravastatin -Recheck lipid profile LFTs in future visits - gave education on dietary changes  Time Spent Directly with Patient:   I have spent a total of 60 minutes with the patient reviewing hospital notes, imaging, EKGs, labs and examining the patient as well as establishing an assessment and plan that was discussed personally with the patient, wife, and son.  > 50% of time was spent in direct patient care.   Shared Decision Making/Informed Consent The risks [stroke (1 in 1000), death (1 in 1000), kidney failure [usually temporary] (1 in 500), bleeding (1 in 200), allergic reaction [possibly serious] (1 in 200)], benefits (diagnostic support and management of coronary artery disease) and alternatives of a cardiac catheterization were discussed in detail with Mr. Pheng and he is  willing to proceed.        Medication Adjustments/Labs and Tests Ordered: Current medicines are reviewed at length with the patient today.  Concerns regarding medicines are outlined above.  Orders Placed This Encounter  Procedures  . Basic metabolic panel  . Pro b natriuretic peptide (BNP)  .  Magnesium  . CBC   Meds ordered this encounter  Medications  . spironolactone (ALDACTONE) 25 MG tablet    Sig: Take 0.5 tablets (12.5 mg total) by mouth daily.    Dispense:  45 tablet    Refill:  3    Patient Instructions  Medication Instructions:  Your physician has recommended you make the following change in your medication:  1.  STOP Amlodipine 2.  START Aldactone (Spironolactone) 25 mg taking only 1/2 tablet daily   *If you need a refill on your cardiac medications before your next appointment, please call your pharmacy*   Lab Work: TODAY:  BMET, PRO BNP, MAG, & CBC  If you have labs (blood work) drawn today and your tests are completely normal, you will receive your results only by: Marland Kitchen MyChart Message (if you have MyChart) OR . A paper copy in the mail If you have any lab test that is abnormal or we need to change your treatment, we will call you to review the results.   Testing/Procedures: Your physician has requested that you have a cardiac catheterization. Cardiac catheterization is used to diagnose and/or treat various heart conditions. Doctors may recommend this procedure for a number of different reasons. The most common reason is to evaluate chest pain. Chest pain can be a symptom of coronary artery disease (CAD), and cardiac catheterization can show whether plaque is narrowing or blocking your heart's arteries. This procedure is also used to evaluate the valves, as well as measure the blood flow and oxygen levels in different parts of your heart. For further information please visit [email or web address] Please follow instruction sheet, BELOW:     [facility] CARDIOVASCULAR DIVISION [facility] OFFICE [address] [place], [address] [place] [address] Dept: [phone] Loc: [phone]  ZYLER HYSON  03/19/2020  You are scheduled for a Cardiac Catheterization on Thursday, January 6 with Dr. Verne Carrow.  1. Please arrive at the [facility] (Main Entrance A) at [facility]: [address] [place], [place] [address] at 7:00 AM (This time is two hours before your procedure to ensure your preparation). Free valet parking service is available.   Special note: Every effort is made to have your procedure done on time. Please understand that emergencies sometimes delay scheduled procedures.  2. Diet: Do not eat solid foods after midnight.  The patient may have clear liquids until 5am upon the day of the procedure.  3. Labs: You will need to have blood drawn on TODAY  4. Medication instructions in preparation for your procedure:  HOLD THE ALDACTONE THE MORNING OF YOUR PROCEDURE   Contrast Allergy: No   Due to recent COVID-19 restrictions implemented by our local and state authorities and in an effort to keep both patients and staff as safe as possible, our hospital system requires COVID-19 testing prior to certain scheduled hospital procedures.  Please go to [address] [place], [place] [address] on 03/26/2020 at 11:30 am  .  This is a drive up testing site.  You will not need to exit your vehicle.  You will not be billed at the time of testing but may receive a bill later depending on your insurance. You must agree to self-quarantine from the time of your testing until the procedure date on 01/26/21.  This should included staying home with ONLY the people you live with.  Avoid take-out, grocery store shopping or leaving the house for any non-emergent reason.  Failure  to have your COVID-19 test done on the date and time you have been scheduled will result in cancellation of your procedure.  Please call  our office at [phone] if you have any questions.   On the morning of your procedure, take your Aspirin and any morning medicines NOT listed above.  You may use sips of water.  5. Plan for one night stay--bring personal belongings. 6. Bring a current list of your medications and current insurance cards. 7. You MUST have a responsible person to drive you home. 8. Someone MUST be with you the first 24 hours after you arrive home or your discharge will be delayed. 9. Please wear clothes that are easy to get on and off and wear slip-on shoes.  Thank you for allowing [place] to care for you!   -- [facility]     Follow-Up: At [facility], you and your health needs are our priority.  As part of our continuing mission to provide you with exceptional heart care, we have created designated Provider Care Teams.  These Care Teams include your primary Cardiologist (physician) and Advanced Practice Providers (APPs -  Physician Assistants and Nurse Practitioners) who all work together to provide you with the care you need, when you need it.  We recommend signing up for the patient portal called "MyChart".  Sign up information is provided on this After Visit Summary.  MyChart is used to connect with patients for Virtual Visits (Telemedicine).  Patients are able to view lab/test results, encounter notes, upcoming appointments, etc.  Non-urgent messages can be sent to your provider as well.   To learn more about what you can do with MyChart, go to [email or web address].    Your next appointment:   2 week(s) after cath : 04/15/2020 ARRIVE AT 2:25  The format for your next appointment:   In Person  Provider:   Rudean Haskell, MD   Other Instructions      Signed, Werner Lean, MD  03/19/2020 3:04 PM    [place]

## 2020-03-20 LAB — CBC
Hematocrit: 45.6 % (ref 37.5–51.0)
Hemoglobin: 15.5 g/dL (ref 13.0–17.7)
MCH: 31.2 pg (ref 26.6–33.0)
MCHC: 34 g/dL (ref 31.5–35.7)
MCV: 92 fL (ref 79–97)
Platelets: 267 10*3/uL (ref 150–450)
RBC: 4.97 x10E6/uL (ref 4.14–5.80)
RDW: 12.2 % (ref 11.6–15.4)
WBC: 6.4 10*3/uL (ref 3.4–10.8)

## 2020-03-20 LAB — BASIC METABOLIC PANEL
BUN/Creatinine Ratio: 28 — ABNORMAL HIGH (ref 10–24)
BUN: 26 mg/dL (ref 8–27)
CO2: 21 mmol/L (ref 20–29)
Calcium: 8.8 mg/dL (ref 8.6–10.2)
Chloride: 99 mmol/L (ref 96–106)
Creatinine, Ser: 0.93 mg/dL (ref 0.76–1.27)
GFR calc Af Amer: 86 mL/min/{1.73_m2} (ref >59)
GFR calc non Af Amer: 75 mL/min/{1.73_m2} (ref >59)
Glucose: 104 mg/dL — ABNORMAL HIGH (ref 65–99)
Potassium: 4.7 mmol/L (ref 3.5–5.2)
Sodium: 138 mmol/L (ref 134–144)

## 2020-03-20 LAB — PRO B NATRIURETIC PEPTIDE: NT-Pro BNP: 1910 pg/mL — ABNORMAL HIGH (ref 0–486)

## 2020-03-20 LAB — MAGNESIUM: Magnesium: 2.4 mg/dL — ABNORMAL HIGH (ref 1.6–2.3)

## 2020-03-21 ENCOUNTER — Ambulatory Visit: Payer: PPO | Admitting: Occupational Therapy

## 2020-03-21 ENCOUNTER — Telehealth: Payer: Self-pay | Admitting: *Deleted

## 2020-03-21 ENCOUNTER — Ambulatory Visit: Payer: PPO | Admitting: Physical Therapy

## 2020-03-21 ENCOUNTER — Encounter: Payer: Self-pay | Admitting: Neurology

## 2020-03-21 ENCOUNTER — Ambulatory Visit: Payer: PPO | Admitting: Neurology

## 2020-03-21 ENCOUNTER — Other Ambulatory Visit: Payer: Self-pay | Admitting: Physical Medicine and Rehabilitation

## 2020-03-21 ENCOUNTER — Other Ambulatory Visit: Payer: Self-pay

## 2020-03-21 VITALS — BP 122/74 | HR 80 | Ht 65.0 in | Wt 186.4 lb

## 2020-03-21 DIAGNOSIS — R41844 Frontal lobe and executive function deficit: Secondary | ICD-10-CM

## 2020-03-21 DIAGNOSIS — I6522 Occlusion and stenosis of left carotid artery: Secondary | ICD-10-CM

## 2020-03-21 DIAGNOSIS — I10 Essential (primary) hypertension: Secondary | ICD-10-CM

## 2020-03-21 DIAGNOSIS — M48062 Spinal stenosis, lumbar region with neurogenic claudication: Secondary | ICD-10-CM | POA: Diagnosis not present

## 2020-03-21 DIAGNOSIS — I5022 Chronic systolic (congestive) heart failure: Secondary | ICD-10-CM | POA: Diagnosis not present

## 2020-03-21 DIAGNOSIS — R41840 Attention and concentration deficit: Secondary | ICD-10-CM

## 2020-03-21 DIAGNOSIS — E782 Mixed hyperlipidemia: Secondary | ICD-10-CM

## 2020-03-21 DIAGNOSIS — R2681 Unsteadiness on feet: Secondary | ICD-10-CM

## 2020-03-21 DIAGNOSIS — I63529 Cerebral infarction due to unspecified occlusion or stenosis of unspecified anterior cerebral artery: Secondary | ICD-10-CM

## 2020-03-21 DIAGNOSIS — I63541 Cerebral infarction due to unspecified occlusion or stenosis of right cerebellar artery: Secondary | ICD-10-CM | POA: Diagnosis not present

## 2020-03-21 DIAGNOSIS — Z720 Tobacco use: Secondary | ICD-10-CM

## 2020-03-21 DIAGNOSIS — M6281 Muscle weakness (generalized): Secondary | ICD-10-CM

## 2020-03-21 DIAGNOSIS — I639 Cerebral infarction, unspecified: Secondary | ICD-10-CM | POA: Diagnosis not present

## 2020-03-21 DIAGNOSIS — R2689 Other abnormalities of gait and mobility: Secondary | ICD-10-CM

## 2020-03-21 DIAGNOSIS — R293 Abnormal posture: Secondary | ICD-10-CM

## 2020-03-21 DIAGNOSIS — R262 Difficulty in walking, not elsewhere classified: Secondary | ICD-10-CM

## 2020-03-21 MED ORDER — FUROSEMIDE 20 MG PO TABS: 20.0000 mg | ORAL_TABLET | Freq: Every day | ORAL | 3 refills | Status: DC

## 2020-03-21 NOTE — Telephone Encounter (Signed)
Thank you! I have placed the order

## 2020-03-21 NOTE — Telephone Encounter (Signed)
-----   Message from Christell Constant, MD sent at 03/21/2020 10:58 AM EST ----- Results: Elevated BNP, safe for LHC Plan: Will start lasix 20 mg PO daily  Christell Constant, MD

## 2020-03-21 NOTE — Therapy (Addendum)
[facility] [address] [place], [place], [address] Phone: [phone]   Fax:  [phone]  Occupational Therapy Treatment  Patient Details  Name: Ruben Barrera MRN: [id number] Date of Birth: [birth year] Referring Provider (OT): Dr. Swartz/ Dr. Ranell Patrick   Encounter Date: 03/21/2020   OT End of Session - 03/21/20 1106    Visit Number 4    Number of Visits 25   will likely d/c after 8 weeks depending on progress.   Date for OT Re-Evaluation 05/30/20    Authorization Type [employer]    Authorization Time Period pt only scheduled for 2x week x 4 weeks, once ST order obtained- add an additional 2x week x 4 weeks for PT/OT as well as ST visits    Authorization - Visit Number 4    Authorization - Number of Visits 10    Progress Note Due on Visit 10    OT Start Time 1106    OT Stop Time 1145    OT Time Calculation (min) 39 min    Activity Tolerance Treatment limited secondary to agitation    Behavior During Therapy Impulsive;Restless           Past Medical History:  Diagnosis Date  . Arthritis   . Complication of anesthesia    "had the shivers" after surgery  . Depression   . Dysrhythmia    "extra heart beat"  . GERD (gastroesophageal reflux disease)   . History of hiatal hernia   . Hypercholesteremia   . Hypertension   . Lumbar spinal stenosis   . RLS (restless legs syndrome)    takes ativan as needed    Past Surgical History:  Procedure Laterality Date  . BACK SURGERY    . LUMBAR LAMINECTOMY/DECOMPRESSION MICRODISCECTOMY N/A 08/28/2015   Procedure: Lumbar Four-Five decompressive lumbar laminectomy;  Surgeon: Jovita Gamma, MD;  Location: [facility];  Service: Neurosurgery;  Laterality: N/A;  . REPLACEMENT TOTAL KNEE BILATERAL Bilateral 11/05/2003  . TOE AMPUTATION Right 2010   second toe  . TONSILLECTOMY      There were no vitals filed for this visit.   Subjective Assessment - 03/21/20  1104    Subjective  Back is really bothering me today--spasms, (has hot pack from PT while sitting x first 52min to back with no adverse reactions)    Patient Stated Goals improve balance    Currently in Pain? Yes    Pain Score 9     Pain Location Back    Pain Orientation Right;Lower    Pain Descriptors / Indicators Sharp;Spasm    Pain Type Chronic pain    Pain Onset More than a month ago    Pain Frequency Intermittent    Aggravating Factors  walking    Pain Relieving Factors heat           Constant therapy:  Alternating symbol match for visual scanning and alternating attention with 67% accuracy and 75.83 sec average response time.  Sitting>standing to place/remove clothespins with 1-8lb resist with each hand for incr strength and balance.    Environmental scanning with ambulation with RW with 11/15 items found upon first pass and remaining found on 2nd pass without cueing.  Sorting grocery list by categorizing to make shopping easier with cueing and difficulty (pt did separate cleaning items after initial cueing, but divided others into 2 categories "?dinner/eat separately" and "toppings/eat with something" vs. separating by produce, dairy/refrigerated, frozen).  Pt also omitted 1 item.  Writing with  approx 90% legibility (decr with fatigue)  Copying small peg design with incr time, self corrected errors (except couldn't distinguish between orange/red) and no cueing needed.      OT Short Term Goals - 03/07/20 1425      OT SHORT TERM GOAL #1   Title I with inital HEP.    Time 4    Period Weeks    Status New    Target Date 04/04/20      OT SHORT TERM GOAL #2   Title Pt will increase bilateral grip strength by 5 lbs for increased ease with ADLs.    Time 4    Period Weeks    Status New      OT SHORT TERM GOAL #3   Title Pt will write a sentence with 90% legibility    Time 4    Period Weeks    Status New      OT SHORT TERM GOAL #4   Title Pt will demonstrate ability to  sequence a functional task/ home management task demonstrating good safety awareness, with no more than min v.c    Time 4    Period Weeks    Status New      OT SHORT TERM GOAL #5   Title Pt will ambulate while perfoming a physical/ cognitive task, with 80% accuracy and no LOB.    Baseline decreased alternating attention, slowed processing    Time 4    Period Weeks    Status New      OT SHORT TERM GOAL #6   Title Further assess cognition in functional context and add goal prn.    Time 4    Period Weeks    Status New             OT Long Term Goals - 03/07/20 1430      OT LONG TERM GOAL #1   Title I with updated HEP for right UE proximal strength    Time 12    Period Weeks    Status New    Target Date 05/30/20      OT LONG TERM GOAL #2   Title Pt will increase standing functional reach to 10 inches or greater to minimize fall risk for ADLS.    Baseline R 8 inches, L 9 inches    Time 12    Period Weeks    Status New      OT LONG TERM GOAL #3   Title Pt will write a short paragraph with 100% legibility.    Baseline 75% at sentence level    Time 12    Period Weeks    Status New      OT LONG TERM GOAL #4   Title Pt will demonstrate ability to perform a physical and cognive task simultaneously with 90% or better accuracy and no LOB.    Time 12    Period Weeks    Status New      OT LONG TERM GOAL #5   Title Pt will sequence a functional task/ home managment task and complete modified independently.    Baseline slowed processing, difficulty following instructions decreased attention.    Time 12      Long Term Additional Goals   Additional Long Term Goals Yes      OT LONG TERM GOAL #6   Title Pt / wife will verbalize understanding of return to driving recommendations.    Time 12    Period Weeks  Status New                 Plan - 03/21/20 1107    Clinical Impression Statement Pt continues to  demo decr awareness of deficits.  Pt demo decr attention to  detail, decr alternating attention, and decr organization/problem solving.  Pt is progressing with writing.  Unable to full address balance due to decr standing tolerance/severe back pain.    OT Occupational Profile and History Detailed Assessment- Review of Records and additional review of physical, cognitive, psychosocial history related to current functional performance    Occupational performance deficits (Please refer to evaluation for details): ADL's;IADL's;Work;Leisure;Play;Social Participation    Body Structure / Function / Physical Skills ADL;Balance;UE functional use;ROM;Gait;GMC;Decreased knowledge of precautions;Coordination;IADL;Decreased knowledge of use of DME;Strength;Dexterity;Mobility    Cognitive Skills Attention;Memory;Problem Solve;Safety Awareness;Sequencing;Temperament/Personality;Thought;Understand    Rehab Potential Good    Clinical Decision Making Limited treatment options, no task modification necessary    Comorbidities Affecting Occupational Performance: May have comorbidities impacting occupational performance    Modification or Assistance to Complete Evaluation  No modification of tasks or assist necessary to complete eval    OT Frequency 2x / week   plus eval anticipate d/c after 8 weeks   OT Duration 12 weeks    OT Treatment/Interventions Self-care/ADL training;Ultrasound;Energy conservation;Patient/family education;DME and/or AE instruction;Aquatic Therapy;Paraffin;Gait Training;Passive range of motion;Balance training;Fluidtherapy;Cryotherapy;Electrical Stimulation;Contrast Bath;Functional Mobility Training;Therapeutic activities;Manual Therapy;Therapeutic exercise;Moist Heat;Neuromuscular education;Cognitive remediation/compensation    Plan functional cognition, work towards goals                                                                                                                                                                                                                                                                         check **to see if STorder received, if so schedule ST eval, pt is currently only scheduled for 4 weeks of OT/ PT, so once ST order recieved please add and addditonal 2x week x 4 weeks for OT/PT and add ST visits (routed to Dr. Everlena Cooper as pt has neurology appt today)    Consulted and Agree with Plan of Care Patient           Patient will benefit from skilled therapeutic intervention in order to improve  the following deficits and impairments:   Body Structure / Function / Physical Skills: ADL,Balance,UE functional use,ROM,Gait,GMC,Decreased knowledge of precautions,Coordination,IADL,Decreased knowledge of use of DME,Strength,Dexterity,Mobility Cognitive Skills: Attention,Memory,Problem Solve,Safety Awareness,Sequencing,Temperament/Personality,Thought,Understand     Visit Diagnosis: Frontal lobe and executive function deficit  Attention and concentration deficit  Abnormal posture  Unsteadiness on feet  Muscle weakness (generalized)    Problem List Patient Active Problem List   Diagnosis Date Noted  . Nonrheumatic aortic valve stenosis 03/19/2020  . Dyslipidemia   . Chronic systolic congestive heart failure ([place])   . Acute ischemic cerebrovascular accident (CVA) involving anterior cerebral artery territory ([place]) 02/22/2020  . Mixed hyperlipidemia   . Tobacco abuse   . Depression   . Acute stroke due to occlusion of right cerebellar artery ([place]) 02/20/2020  . Erythrocytosis 02/20/2020  . BPH (benign prostatic hyperplasia) 02/20/2020  . HNP (herniated nucleus pulposus), lumbar 01/13/2016  . Lumbar stenosis with neurogenic claudication 08/28/2015  . DOE (dyspnea on exertion) 05/01/2014  . Essential hypertension 05/01/2014  . Left shoulder pain 05/05/2011    [facility] 03/21/2020, 12:28 PM  [facility] [address] [place], [place],  [address] Phone: [phone]   Fax:  [phone]  Name: SAYRE CASTLES MRN: [id number] Date of Birth: 05-28-1932   Vianne Bulls, OTR/L [facility] [address] [place], [place]  [address] [phone] phone [phone] 03/21/20 12:28 PM

## 2020-03-21 NOTE — Patient Instructions (Signed)
1.  Continue aspirin 81mg  daily, pravastatin and blood pressure medications 2.  Recommend Mediterranean diet (see below) 3.  Try to quit cigar smoking 4.  Regarding surgery for the spinal stimulator:  Ideally, I recommend holding all non-emergent surgeries until 6 months after the stroke if possible.  The aspirin would need to be stopped prior to the surgery, which can increase risk for stroke.  Given your pain, I understand the need to not wait.  I just want you to be aware of the risks and benefits. 5.  Follow up in 6 months   Mediterranean Diet A Mediterranean diet refers to food and lifestyle choices that are based on the traditions of countries located on the . This way of eating has been shown to help prevent certain conditions and improve outcomes for people who have chronic diseases, like kidney disease and heart disease. What are tips for following this plan? Lifestyle  Cook and eat meals together with your family, when possible.  Drink enough fluid to keep your urine clear or pale yellow.  Be physically active every day. This includes: ? Aerobic exercise like running or swimming. ? Leisure activities like gardening, walking, or housework.  Get 7-8 hours of sleep each night.  If recommended by your health care provider, drink red wine in moderation. This means 1 glass a day for nonpregnant women and 2 glasses a day for men. A glass of wine equals 5 oz (150 mL). Reading food labels   Check the serving size of packaged foods. For foods such as rice and pasta, the serving size refers to the amount of cooked product, not dry.  Check the total fat in packaged foods. Avoid foods that have saturated fat or trans fats.  Check the ingredients list for added sugars, such as corn syrup. Shopping  At the grocery store, buy most of your food from the areas near the walls of the store. This includes: ? Fresh fruits and vegetables (produce). ? Grains, beans, nuts, and  seeds. Some of these may be available in unpackaged forms or large amounts (in bulk). ? Fresh seafood. ? Poultry and eggs. ? Low-fat dairy products.  Buy whole ingredients instead of prepackaged foods.  Buy fresh fruits and vegetables in-season from local farmers markets.  Buy frozen fruits and vegetables in resealable bags.  If you do not have access to quality fresh seafood, buy precooked frozen shrimp or canned fish, such as tuna, salmon, or sardines.  Buy small amounts of raw or cooked vegetables, salads, or olives from the deli or salad bar at your store.  Stock your pantry so you always have certain foods on hand, such as olive oil, canned tuna, canned tomatoes, rice, pasta, and beans. Cooking  Cook foods with extra-virgin olive oil instead of using butter or other vegetable oils.  Have meat as a side dish, and have vegetables or grains as your main dish. This means having meat in small portions or adding small amounts of meat to foods like pasta or stew.  Use beans or vegetables instead of meat in common dishes like chili or lasagna.  Experiment with different cooking methods. Try roasting or broiling vegetables instead of steaming or sauteing them.  Add frozen vegetables to soups, stews, pasta, or rice.  Add nuts or seeds for added healthy fat at each meal. You can add these to yogurt, salads, or vegetable dishes.  Marinate fish or vegetables using olive oil, lemon juice, garlic, and fresh herbs. Meal planning  Plan to eat 1 vegetarian meal one day each week. Try to work up to 2 vegetarian meals, if possible.  Eat seafood 2 or more times a week.  Have healthy snacks readily available, such as: ? Vegetable sticks with hummus. ? [place] yogurt. ? Fruit and nut trail mix.  Eat balanced meals throughout the week. This includes: ? Fruit: 2-3 servings a day ? Vegetables: 4-5 servings a day ? Low-fat dairy: 2 servings a day ? Fish, poultry, or lean meat: 1 serving a  day ? Beans and legumes: 2 or more servings a week ? Nuts and seeds: 1-2 servings a day ? Whole grains: 6-8 servings a day ? Extra-virgin olive oil: 3-4 servings a day  Limit red meat and sweets to only a few servings a month What are my food choices?  Mediterranean diet ? Recommended  Grains: Whole-grain pasta. Brown rice. Bulgar wheat. Polenta. Couscous. Whole-wheat bread. [place].  Vegetables: Artichokes. Beets. Broccoli. Cabbage. Carrots. Eggplant. Green beans. Chard. Kale. Spinach. Onions. Leeks. Peas. Squash. Tomatoes. Peppers. Radishes.  Fruits: Apples. Apricots. Avocado. Berries. Bananas. Cherries. Dates. Figs. Grapes. Lemons. Melon. Oranges. Peaches. Plums. Pomegranate.  Meats and other protein foods: Beans. Almonds. Sunflower seeds. Pine nuts. Peanuts. [place]. Salmon. Scallops. Shrimp. [place]. Tilapia. Clams. Oysters. Eggs.  Dairy: Low-fat milk. Cheese. Greek yogurt.  Beverages: Water. Red wine. Herbal tea.  Fats and oils: Extra virgin olive oil. Avocado oil. Grape seed oil.  Sweets and desserts: [place] yogurt with honey. Baked apples. Poached pears. Trail mix.  Seasoning and other foods: Basil. Cilantro. Coriander. Cumin. Mint. Parsley. Sage. Rosemary. Tarragon. Garlic. Oregano. Thyme. Pepper. Balsalmic vinegar. Tahini. Hummus. Tomato sauce. Olives. Mushrooms. ? Limit these  Grains: Prepackaged pasta or rice dishes. Prepackaged cereal with added sugar.  Vegetables: Deep fried potatoes (french fries).  Fruits: Fruit canned in syrup.  Meats and other protein foods: Beef. Pork. Lamb. Poultry with skin. Hot dogs. [place] Salines.  Dairy: Ice cream. Sour cream. Whole milk.  Beverages: Juice. Sugar-sweetened soft drinks. Beer. Liquor and spirits.  Fats and oils: Butter. Canola oil. Vegetable oil. Beef fat (tallow). Lard.  Sweets and desserts: Cookies. Cakes. Pies. Candy.  Seasoning and other foods: Mayonnaise. Premade sauces and marinades. The items listed may not be a  complete list. Talk with your dietitian about what dietary choices are right for you. Summary  The Mediterranean diet includes both food and lifestyle choices.  Eat a variety of fresh fruits and vegetables, beans, nuts, seeds, and whole grains.  Limit the amount of red meat and sweets that you eat.  Talk with your health care provider about whether it is safe for you to drink red wine in moderation. This means 1 glass a day for nonpregnant women and 2 glasses a day for men. A glass of wine equals 5 oz (150 mL). This information is not intended to replace advice given to you by your health care provider. Make sure you discuss any questions you have with your health care provider. Document Revised: 11/07/2015 Document Reviewed: 10/31/2015 Elsevier Patient Education  [place].

## 2020-03-21 NOTE — Therapy (Signed)
[facility] [address] [place], [place], [address] Phone: [phone]   Fax:  [phone]  Physical Therapy Treatment  Patient Details  Name: Ruben Barrera MRN: [id number] Date of Birth: 05/20/1932 Referring Provider (PT): Charlton Amor, PA-C   Encounter Date: 03/21/2020   PT End of Session - 03/21/20 1029    Visit Number 4    Number of Visits 9    Date for PT Re-Evaluation 04/04/20    Authorization Type [employer]    PT Start Time 1015    PT Stop Time 1100    PT Time Calculation (min) 45 min    Equipment Utilized During Treatment Gait belt    Activity Tolerance Patient tolerated treatment well;Patient limited by pain;Patient limited by fatigue   seated rest breaks taken as necessary   Behavior During Therapy Impulsive;WFL for tasks assessed/performed           Past Medical History:  Diagnosis Date  . Arthritis   . Complication of anesthesia    "had the shivers" after surgery  . Depression   . Dysrhythmia    "extra heart beat"  . GERD (gastroesophageal reflux disease)   . History of hiatal hernia   . Hypercholesteremia   . Hypertension   . Lumbar spinal stenosis   . RLS (restless legs syndrome)    takes ativan as needed    Past Surgical History:  Procedure Laterality Date  . BACK SURGERY    . LUMBAR LAMINECTOMY/DECOMPRESSION MICRODISCECTOMY N/A 08/28/2015   Procedure: Lumbar Four-Five decompressive lumbar laminectomy;  Surgeon: Shirlean Kelly, MD;  Location: [facility];  Service: Neurosurgery;  Laterality: N/A;  . REPLACEMENT TOTAL KNEE BILATERAL Bilateral 11/05/2003  . TOE AMPUTATION Right 2010   second toe  . TONSILLECTOMY      There were no vitals filed for this visit.   Subjective Assessment - 03/21/20 1019    Subjective Pt states his back is bad today. Pt states he is getting spasms running down both his legs. Pt reports he is to get a heart cath Jan 7 and then plans to get  "wires" in his back to manage his back pain later this Jan/Feb.    Patient is accompained by: Family member   wife   Limitations Walking    How long can you walk comfortably? 2-3 loops down the drive way with the walker    Patient Stated Goals Walking outside on unlevel; get back to using the cane exclusively.    Currently in Pain? Yes    Pain Score 4     Pain Location Back                                  Balance Exercises - 03/21/20 0001      Balance Exercises: Standing   Standing Eyes Closed Foam/compliant surface;Narrow base of support (BOS)   2x6 head turns and head rotations   Standing Eyes Closed Limitations EC x30 sec with slightly narrow BOS. Seated    SLS with Vectors Solid surface;Limitations   x10 tapping forward on foam   Stepping Strategy Anterior;Posterior;Foam/compliant surface;UE support;Limitations    Stepping Strategy Limitations completed anterior/posterior stepping strategy on blue balance x 10 reps bilaterally with each direction. increased UE support required with completion. CGA throughout. verbal cues for improved step length on RLE.    Sidestepping Foam/compliant support;Upper extremity support;2 reps;Limitations    Sidestepping Limitations  completed side stepping x 2 laps down and back, in // bars. verbal cues for improved step length with RLE.             PT Education - 03/21/20 1209    Education Details Discussed using heat and TENS unit for his back spasm. Discussed balance exercises on HEP.    Person(s) Educated Patient    Methods Explanation;Demonstration;Verbal cues    Comprehension Verbalized understanding;Returned demonstration            PT Short Term Goals - 03/21/20 1114      PT SHORT TERM GOAL #1   Title Pt will be independent with initial HEP    Time 2    Period Weeks    Status New    Target Date 03/21/20      PT SHORT TERM GOAL #2   Title PT will be able to complete FGA and create appropriate goal within  the next 1-2 visits    Baseline DGA completed; pt scored 15/24    Time 2    Period Weeks    Status Achieved    Target Date 03/21/20             PT Long Term Goals - 03/21/20 1115      PT LONG TERM GOAL #1   Title Pt will be independent with balance exercises at home    Time 4    Period Weeks    Status New      PT LONG TERM GOAL #2   Title Pt will have improved gait speed by at least .4 ft/sec to demo minimal clinically important difference    Time 4    Period Weeks    Status New      PT LONG TERM GOAL #3   Title Pt will have improved Berg Balance score to at least 45/56 to demo decreased fall risk    Baseline 42/56 on eval    Time 4    Period Weeks    Status New      PT LONG TERM GOAL #4   Title Pt's FOTO score will remain at or above 70%    Time 4    Period Weeks    Status New      PT LONG TERM GOAL #5   Title Pt will be able to demo safe mobility with LRAD outdoors on unlevel surfaces for >1000' for improved community mobility    Baseline Currently just making loops to the mailbox on level ground (Pt's major goal is to return to his walking)    Time 4    Period Weeks    Status New      Additional Long Term Goals   Additional Long Term Goals Yes      PT LONG TERM GOAL #6   Title Pt will have improved DGI to at least 20/24 to demo decreased fall risk    Baseline 15/24    Time 4    Period Weeks    Status New    Target Date 04/05/19                 Plan - 03/21/20 1045    Clinical Impression Statement Pt with improving balance with eyes closed. Pt remains limited due to his back pain/spasms and requires seated rest breaks. Treatment session focused on stepping and balancing on compliant surfaces. Discussed with pt holding PT until after he gets "the wires" in his back to manage his back pain better.  Pt will need reinforcement and updates for his balance exercises on next visit prior to placing pt on hold.    Personal Factors and Comorbidities  Age;Comorbidity 1;Comorbidity 2;Time since onset of injury/illness/exacerbation    Comorbidities chronic back pain from lumbar stenosis, HTN, arthritis    Examination-Activity Limitations Locomotion Level;Stand;Stairs;Squat    Examination-Participation Restrictions Community Activity;Driving;Yard Work    [employer] Evolving/Moderate complexity    Rehab Potential Good    PT Frequency 2x / week    PT Duration 4 weeks   plan for pt to schedule another 4 wks after he gets his Speech therapy consult   PT Treatment/Interventions ADLs/Self Care Home Management;Aquatic Therapy;Electrical Stimulation;Cryotherapy;Moist Heat;DME Instruction;Gait training;Stair training;Functional mobility training;Therapeutic activities;Therapeutic exercise;Balance training;Neuromuscular re-education;Iontophoresis 4mg /ml Dexamethasone;Patient/family education;Manual techniques;Passive range of motion;Dry needling;Taping;Vasopneumatic Device;Vestibular    PT Next Visit Plan Plan to hold PT until after pt gets the wires for his back pain. Please provide one final HEP that he can perform until his procedure. Continue gait training/dynamic gait, standing balance and dynamic balance in // bars. functional BLE strengthening (more so LLE>RLE). Please reiterate/reinforce everything as pt may not remember.    PT Home Exercise Plan Access Code: [facility]    Consulted and Agree with Plan of Care Patient           Patient will benefit from skilled therapeutic intervention in order to improve the following deficits and impairments:  Abnormal gait,Difficulty walking,Decreased activity tolerance,Pain,Decreased balance,Hypomobility,Impaired flexibility,Improper body mechanics,Decreased mobility,Decreased strength,Postural dysfunction  Visit Diagnosis: Unsteadiness on feet  Muscle weakness (generalized)  Difficulty in walking, not elsewhere classified  Other abnormalities of gait and mobility     Problem  List Patient Active Problem List   Diagnosis Date Noted  . Nonrheumatic aortic valve stenosis 03/19/2020  . Dyslipidemia   . Chronic systolic congestive heart failure ([place])   . Acute ischemic cerebrovascular accident (CVA) involving anterior cerebral artery territory ([place]) 02/22/2020  . Mixed hyperlipidemia   . Tobacco abuse   . Depression   . Acute stroke due to occlusion of right cerebellar artery ([place]) 02/20/2020  . Erythrocytosis 02/20/2020  . BPH (benign prostatic hyperplasia) 02/20/2020  . HNP (herniated nucleus pulposus), lumbar 01/13/2016  . Lumbar stenosis with neurogenic claudication 08/28/2015  . DOE (dyspnea on exertion) 05/01/2014  . Essential hypertension 05/01/2014  . Left shoulder pain 05/05/2011    [facility] [address] [place] PT, DPT 03/21/2020, 12:11 PM  [place] [address] [place], [place], [address] Phone: [phone]   Fax:  [phone]  Name: Ruben Barrera MRN: [id number] Date of Birth: [birth year]

## 2020-03-21 NOTE — Telephone Encounter (Signed)
The patient has been notified of the result and verbalized understanding.  All questions (if any) were answered. Sampson Goon, RN 03/21/2020 12:09 PM    Placed order for Lasix. Patient made aware and agreeable

## 2020-03-25 ENCOUNTER — Other Ambulatory Visit: Payer: Self-pay | Admitting: Cardiology

## 2020-03-25 ENCOUNTER — Ambulatory Visit: Payer: PPO | Admitting: Physical Therapy

## 2020-03-25 ENCOUNTER — Ambulatory Visit: Payer: PPO | Admitting: Occupational Therapy

## 2020-03-25 ENCOUNTER — Telehealth: Payer: Self-pay | Admitting: Cardiovascular Disease

## 2020-03-25 DIAGNOSIS — I639 Cerebral infarction, unspecified: Secondary | ICD-10-CM

## 2020-03-25 DIAGNOSIS — I493 Ventricular premature depolarization: Secondary | ICD-10-CM

## 2020-03-25 DIAGNOSIS — I429 Cardiomyopathy, unspecified: Secondary | ICD-10-CM

## 2020-03-25 NOTE — Telephone Encounter (Signed)
Patient's wife is calling regarding heart cath scheduled for 03/28/20. She would like to know if she is able to stay in the O.R. with the patient and if she can consult with Dr. Clifton James after the procedure. She also inquired about whether she will need a COVID test since she will be with the patient. Please return call to discuss. If the call is returned before 11:00 AM on 03/25/20, please return call to [phone]. If after 11:00 AM, please call [phone].

## 2020-03-25 NOTE — Telephone Encounter (Signed)
Spoke with patient's wife.  All questions/concerns have been addressed.  She thanked me for the information.

## 2020-03-26 ENCOUNTER — Other Ambulatory Visit (HOSPITAL_COMMUNITY)
Admission: RE | Admit: 2020-03-26 | Discharge: 2020-03-26 | Disposition: A | Discharge status: Home / Self Care | Payer: PPO | Source: Ambulatory Visit | Attending: Cardiovascular Disease | Admitting: Cardiovascular Disease

## 2020-03-26 ENCOUNTER — Telehealth: Payer: Self-pay

## 2020-03-26 DIAGNOSIS — Z20822 Contact with and (suspected) exposure to covid-19: Secondary | ICD-10-CM | POA: Diagnosis not present

## 2020-03-26 DIAGNOSIS — Z01818 Encounter for other preprocedural examination: Secondary | ICD-10-CM | POA: Diagnosis not present

## 2020-03-26 LAB — SARS CORONAVIRUS 2 (TAT 6-24 HRS): SARS Coronavirus 2: NEGATIVE

## 2020-03-26 MED ORDER — METOPROLOL SUCCINATE ER 25 MG PO TB24: 25.0000 mg | ORAL_TABLET | Freq: Every day | ORAL | 3 refills | Status: DC

## 2020-03-26 NOTE — Telephone Encounter (Signed)
I spoke with patient reviewed monitor results and notified of increase in metoprolol dose new prescription sent to pharmacy.  Told to continue to monitor BP and HR.  He verbalized understanding.

## 2020-03-26 NOTE — Telephone Encounter (Signed)
-----   Message from Christell Constant, MD sent at 03/26/2020  1:47 PM EST ----- Results: PVCs, SVT, NSVT Plan: Increase metoprolol succinate to 25 mg PO Daily  Christell Constant, MD

## 2020-03-27 ENCOUNTER — Telehealth: Payer: Self-pay | Admitting: *Deleted

## 2020-03-27 NOTE — Telephone Encounter (Addendum)
Patient's wife(DPR) reports she and patient do not think patient has been taking metoprolol succinate at all. Patient's wife reports patient picked up #22 metoprolol succinate 25 mg tablets 03/15/20 (I confirmed this with [facility]) and still has # 22 tablets in medication bottle.  Pt's wife advised to have patient start taking 1/2 of metoprolol succinate 25 mg tablet daily since he has not been taking any at least  since 03/15/20. Pt and pt's wife aware I will forward to Dr Izora Ribas for review and any further recommendations.

## 2020-03-27 NOTE — Telephone Encounter (Signed)
Pt contacted pre-catheterization scheduled at [facility] for: Thursday March 28, 2020 9 AM Verified arrival time and place: [facility] Main Entrance [facility]) at: 7 AM   No solid food after midnight prior to cath, clear liquids until 5 AM day of procedure.  Hold: Spironolactone -AM of procedure  Lasix-AM of procedure   Except hold medications AM meds can be  taken pre-cath with sips of water including: ASA 81 mg   Confirmed patient has responsible adult to drive home post procedure and be with patient first 24 hours after arriving home: yes  You are allowed ONE visitor in the waiting room during the time you are at the hospital for your procedure. Both you and your visitor must wear a mask once you enter the hospital.  Reviewed procedure/mask/visitor instructions reviewed with patient's wife.

## 2020-03-28 ENCOUNTER — Encounter (HOSPITAL_COMMUNITY)
Admission: RE | Disposition: A | Discharge status: Home / Self Care | Payer: PPO | Source: Ambulatory Visit | Attending: Cardiovascular Disease

## 2020-03-28 ENCOUNTER — Ambulatory Visit: Payer: PPO | Admitting: Physical Therapy

## 2020-03-28 ENCOUNTER — Ambulatory Visit: Payer: PPO | Admitting: Occupational Therapy

## 2020-03-28 ENCOUNTER — Ambulatory Visit (HOSPITAL_COMMUNITY)
Admission: RE | Admit: 2020-03-28 | Discharge: 2020-03-28 | Disposition: A | Discharge status: Home / Self Care | Payer: PPO | Source: Ambulatory Visit | Attending: Cardiovascular Disease | Admitting: Cardiovascular Disease

## 2020-03-28 DIAGNOSIS — I350 Nonrheumatic aortic (valve) stenosis: Secondary | ICD-10-CM | POA: Insufficient documentation

## 2020-03-28 DIAGNOSIS — I2510 Atherosclerotic heart disease of native coronary artery without angina pectoris: Secondary | ICD-10-CM | POA: Insufficient documentation

## 2020-03-28 DIAGNOSIS — Z79899 Other long term (current) drug therapy: Secondary | ICD-10-CM | POA: Diagnosis not present

## 2020-03-28 DIAGNOSIS — F17290 Nicotine dependence, other tobacco product, uncomplicated: Secondary | ICD-10-CM | POA: Insufficient documentation

## 2020-03-28 DIAGNOSIS — E782 Mixed hyperlipidemia: Secondary | ICD-10-CM | POA: Insufficient documentation

## 2020-03-28 DIAGNOSIS — I5022 Chronic systolic (congestive) heart failure: Secondary | ICD-10-CM | POA: Insufficient documentation

## 2020-03-28 DIAGNOSIS — Z7982 Long term (current) use of aspirin: Secondary | ICD-10-CM | POA: Diagnosis not present

## 2020-03-28 HISTORY — PX: RIGHT/LEFT HEART CATH AND CORONARY ANGIOGRAPHY: CATH118266

## 2020-03-28 LAB — POCT I-STAT 7, (LYTES, BLD GAS, ICA,H+H)
Acid-Base Excess: 2 mmol/L (ref 0.0–2.0)
Bicarbonate: 27.9 mmol/L (ref 20.0–28.0)
Calcium, Ion: 1.1 mmol/L — ABNORMAL LOW (ref 1.15–1.40)
HCT: 44 % (ref 39.0–52.0)
Hemoglobin: 15 g/dL (ref 13.0–17.0)
O2 Saturation: 99 %
Potassium: 4.2 mmol/L (ref 3.5–5.1)
Sodium: 141 mmol/L (ref 135–145)
TCO2: 29 mmol/L (ref 22–32)
pCO2 arterial: 45.9 mmHg (ref 32.0–48.0)
pH, Arterial: 7.392 (ref 7.350–7.450)
pO2, Arterial: 146 mmHg — ABNORMAL HIGH (ref 83.0–108.0)

## 2020-03-28 LAB — POCT I-STAT EG7
Acid-Base Excess: 3 mmol/L — ABNORMAL HIGH (ref 0.0–2.0)
Bicarbonate: 31 mmol/L — ABNORMAL HIGH (ref 20.0–28.0)
Calcium, Ion: 1.24 mmol/L (ref 1.15–1.40)
HCT: 46 % (ref 39.0–52.0)
Hemoglobin: 15.6 g/dL (ref 13.0–17.0)
O2 Saturation: 75 %
Potassium: 4.4 mmol/L (ref 3.5–5.1)
Sodium: 140 mmol/L (ref 135–145)
TCO2: 33 mmol/L — ABNORMAL HIGH (ref 22–32)
pCO2, Ven: 58.8 mmHg (ref 44.0–60.0)
pH, Ven: 7.33 (ref 7.250–7.430)
pO2, Ven: 44 mmHg (ref 32.0–45.0)

## 2020-03-28 LAB — POCT ACTIVATED CLOTTING TIME: Activated Clotting Time: 196 seconds

## 2020-03-28 SURGERY — RIGHT/LEFT HEART CATH AND CORONARY ANGIOGRAPHY
Anesthesia: LOCAL

## 2020-03-28 MED ORDER — LIDOCAINE HCL (PF) 1 % IJ SOLN
INTRAMUSCULAR | Status: AC
  Filled 2020-03-28: qty 30

## 2020-03-28 MED ORDER — SODIUM CHLORIDE 0.9 % IV SOLN: 250.0000 mL | INTRAVENOUS | Status: DC | PRN

## 2020-03-28 MED ORDER — HYDRALAZINE HCL 20 MG/ML IJ SOLN: 10.0000 mg | INTRAMUSCULAR | Status: DC | PRN

## 2020-03-28 MED ORDER — LABETALOL HCL 5 MG/ML IV SOLN: 10.0000 mg | INTRAVENOUS | Status: DC | PRN

## 2020-03-28 MED ORDER — ASPIRIN 81 MG PO CHEW: 81.0000 mg | CHEWABLE_TABLET | ORAL | Status: DC

## 2020-03-28 MED ORDER — IOHEXOL 350 MG/ML SOLN
INTRAVENOUS | Status: DC | PRN
  Administered 2020-03-28: 75 mL

## 2020-03-28 MED ORDER — SODIUM CHLORIDE 0.9% FLUSH: 3.0000 mL | Freq: Two times a day (BID) | INTRAVENOUS | Status: DC

## 2020-03-28 MED ORDER — HEPARIN SODIUM (PORCINE) 1000 UNIT/ML IJ SOLN
INTRAMUSCULAR | Status: AC
  Filled 2020-03-28: qty 1

## 2020-03-28 MED ORDER — FENTANYL CITRATE (PF) 100 MCG/2ML IJ SOLN
INTRAMUSCULAR | Status: AC
  Filled 2020-03-28: qty 2

## 2020-03-28 MED ORDER — SODIUM CHLORIDE 0.9% FLUSH: 3.0000 mL | INTRAVENOUS | Status: DC | PRN

## 2020-03-28 MED ORDER — VERAPAMIL HCL 2.5 MG/ML IV SOLN
INTRAVENOUS | Status: DC | PRN
  Administered 2020-03-28: 10 mL via INTRA_ARTERIAL

## 2020-03-28 MED ORDER — VERAPAMIL HCL 2.5 MG/ML IV SOLN
INTRAVENOUS | Status: AC
  Filled 2020-03-28: qty 2

## 2020-03-28 MED ORDER — SODIUM CHLORIDE 0.9 % IV SOLN: INTRAVENOUS | Status: AC

## 2020-03-28 MED ORDER — MIDAZOLAM HCL 2 MG/2ML IJ SOLN
INTRAMUSCULAR | Status: AC
  Filled 2020-03-28: qty 2

## 2020-03-28 MED ORDER — HEPARIN (PORCINE) IN NACL 1000-0.9 UT/500ML-% IV SOLN
INTRAVENOUS | Status: AC
  Filled 2020-03-28: qty 1000

## 2020-03-28 MED ORDER — ONDANSETRON HCL 4 MG/2ML IJ SOLN: 4.0000 mg | Freq: Four times a day (QID) | INTRAMUSCULAR | Status: DC | PRN

## 2020-03-28 MED ORDER — HEPARIN (PORCINE) IN NACL 1000-0.9 UT/500ML-% IV SOLN
INTRAVENOUS | Status: DC | PRN
  Administered 2020-03-28 (×3): 500 mL

## 2020-03-28 MED ORDER — ACETAMINOPHEN 325 MG PO TABS: 650.0000 mg | ORAL_TABLET | ORAL | Status: DC | PRN

## 2020-03-28 MED ORDER — FENTANYL CITRATE (PF) 100 MCG/2ML IJ SOLN
INTRAMUSCULAR | Status: DC | PRN
  Administered 2020-03-28 (×2): 25 ug via INTRAVENOUS

## 2020-03-28 MED ORDER — MIDAZOLAM HCL 2 MG/2ML IJ SOLN
INTRAMUSCULAR | Status: DC | PRN
  Administered 2020-03-28 (×2): 1 mg via INTRAVENOUS

## 2020-03-28 MED ORDER — HEPARIN SODIUM (PORCINE) 1000 UNIT/ML IJ SOLN
INTRAMUSCULAR | Status: DC | PRN
  Administered 2020-03-28: 4000 [IU] via INTRAVENOUS

## 2020-03-28 MED ORDER — LIDOCAINE HCL (PF) 1 % IJ SOLN
INTRAMUSCULAR | Status: DC | PRN
  Administered 2020-03-28 (×2): 20 mL

## 2020-03-28 MED ORDER — SODIUM CHLORIDE 0.9 % IV SOLN: INTRAVENOUS | Status: DC

## 2020-03-28 SURGICAL SUPPLY — 24 items
CATH 5FR JL3.5 JR4 ANG PIG MP (CATHETERS) ×1 IMPLANT
CATH INFINITI 5 FR 3DRC (CATHETERS) ×1 IMPLANT
CATH INFINITI 5 FR AL2 (CATHETERS) ×1 IMPLANT
CATH INFINITI 5FR AL1 (CATHETERS) ×1 IMPLANT
CATH SWAN GANZ 7F STRAIGHT (CATHETERS) ×1 IMPLANT
COVER SWIFTLINK CONNECTOR (BAG) ×1 IMPLANT
DEVICE RAD COMP TR BAND LRG (VASCULAR PRODUCTS) ×1 IMPLANT
GLIDESHEATH SLEND SS 6F .021 (SHEATH) ×1 IMPLANT
GUIDEWIRE INQWIRE 1.5J.035X260 (WIRE) IMPLANT
INQWIRE 1.5J .035X260CM (WIRE) ×2
KIT HEART LEFT (KITS) ×2 IMPLANT
PACK CARDIAC CATHETERIZATION (CUSTOM PROCEDURE TRAY) ×2 IMPLANT
SHEATH 6FR 75 DEST SLENDER (SHEATH) ×1 IMPLANT
SHEATH 6FR 85 DEST SLENDER (SHEATH) ×1 IMPLANT
SHEATH GLIDE SLENDER 4/5FR (SHEATH) ×1 IMPLANT
SHEATH PINNACLE 5F 10CM (SHEATH) ×1 IMPLANT
SHEATH PINNACLE 7F 10CM (SHEATH) ×1 IMPLANT
SHEATH PINNACLE ST 6F 45CM (SHEATH) ×1 IMPLANT
TRANSDUCER W/STOPCOCK (MISCELLANEOUS) ×2 IMPLANT
TUBING CIL FLEX 10 FLL-RA (TUBING) ×2 IMPLANT
WIRE EMERALD 3MM-J .025X260CM (WIRE) ×1 IMPLANT
WIRE EMERALD 3MM-J .035X150CM (WIRE) ×1 IMPLANT
WIRE EMERALD ST .035X150CM (WIRE) ×1 IMPLANT
WIRE HI TORQ VERSACORE-J 145CM (WIRE) ×1 IMPLANT

## 2020-03-28 NOTE — Discharge Instructions (Signed)
Drink plenty of fluids for 48 hours and keep wrist elevated at heart level for 24 hours  Radial Site Care   This sheet gives you information about how to care for yourself after your procedure. Your health care provider may also give you more specific instructions. If you have problems or questions, contact your health care provider. What can I expect after the procedure? After the procedure, it is common to have:  Bruising and tenderness at the catheter insertion area. Follow these instructions at home: Medicines  Take over-the-counter and prescription medicines only as told by your health care provider. Insertion site care 1. Follow instructions from your health care provider about how to take care of your insertion site. Make sure you: ? Wash your hands with soap and water before you change your bandage (dressing). If soap and water are not available, use hand sanitizer. ? Remove your dressing as told by your health care provider. In 24 hours 2. Check your insertion site every day for signs of infection. Check for: ? Redness, swelling, or pain. ? Fluid or blood. ? Pus or a bad smell. ? Warmth. 3. Do not take baths, swim, or use a hot tub until your health care provider approves. 4. You may shower 24-48 hours after the procedure, or as directed by your health care provider. ? Remove the dressing and gently wash the site with plain soap and water. ? Pat the area dry with a clean towel. ? Do not rub the site. That could cause bleeding. 5. Do not apply powder or lotion to the site. Activity   1. For 24 hours after the procedure, or as directed by your health care provider: ? Do not flex or bend the affected arm. ? Do not push or pull heavy objects with the affected arm. ? Do not drive yourself home from the hospital or clinic. You may drive 24 hours after the procedure unless your health care provider tells you not to. ? Do not operate machinery or power tools. 2. Do not lift  anything that is heavier than 10 lb (4.5 kg), or the limit that you are told, until your health care provider says that it is safe.  For 4 days 3. Ask your health care provider when it is okay to: ? Return to work or school. ? Resume usual physical activities or sports. ? Resume sexual activity. General instructions  If the catheter site starts to bleed, raise your arm and put firm pressure on the site. If the bleeding does not stop, get help right away. This is a medical emergency.  If you went home on the same day as your procedure, a responsible adult should be with you for the first 24 hours after you arrive home.  Keep all follow-up visits as told by your health care provider. This is important. Contact a health care provider if:  You have a fever.  You have redness, swelling, or yellow drainage around your insertion site. Get help right away if:  You have unusual pain at the radial site.  The catheter insertion area swells very fast.  The insertion area is bleeding, and the bleeding does not stop when you hold steady pressure on the area.  Your arm or hand becomes pale, cool, tingly, or numb. These symptoms may represent a serious problem that is an emergency. Do not wait to see if the symptoms will go away. Get medical help right away. Call your local emergency services (911 in the [place] Do   not drive yourself to the hospital. Summary  After the procedure, it is common to have bruising and tenderness at the site.  Follow instructions from your health care provider about how to take care of your radial site wound. Check the wound every day for signs of infection.  Do not lift anything that is heavier than 10 lb (4.5 kg), or the limit that you are told, until your health care provider says that it is safe. This information is not intended to replace advice given to you by your health care provider. Make sure you discuss any questions you have with your health care  provider. Document Revised: 04/14/2017 Document Reviewed: 04/14/2017 Elsevier Patient Education  2020 Elsevier Inc.  Femoral Site Care This sheet gives you information about how to care for yourself after your procedure. Your health care provider may also give you more specific instructions. If you have problems or questions, contact your health care provider. What can I expect after the procedure?  After the procedure, it is common to have:  Bruising that usually fades within 1-2 weeks.  Tenderness at the site. Follow these instructions at home: Wound care 6. Follow instructions from your health care provider about how to take care of your insertion site. Make sure you: ? Wash your hands with soap and water before you change your bandage (dressing). If soap and water are not available, use hand sanitizer. ? Remove your dressing as told by your health care provider. In 24 hours 7. Do not take baths, swim, or use a hot tub until your health care provider approves. 8. You may shower 24-48 hours after the procedure or as told by your health care provider. ? Gently wash the site with plain soap and water. ? Pat the area dry with a clean towel. ? Do not rub the site. This may cause bleeding. 9. Do not apply powder or lotion to the site. Keep the site clean and dry. 10. Check your femoral site every day for signs of infection. Check for: ? Redness, swelling, or pain. ? Fluid or blood. ? Warmth. ? Pus or a bad smell. Activity 4. For the first 2-3 days after your procedure, or as long as directed: ? Avoid climbing stairs as much as possible. ? Do not squat. 5. Do not lift anything that is heavier than 10 lb (4.5 kg), or the limit that you are told, until your health care provider says that it is safe. For 5 days 6. Rest as directed. ? Avoid sitting for a long time without moving. Get up to take short walks every 1-2 hours. 7. Do not drive for 24 hours if you were given a medicine to help  you relax (sedative). General instructions  Take over-the-counter and prescription medicines only as told by your health care provider.  Keep all follow-up visits as told by your health care provider. This is important. Contact a health care provider if you have:  A fever or chills.  You have redness, swelling, or pain around your insertion site. Get help right away if:  The catheter insertion area swells very fast.  You pass out.  You suddenly start to sweat or your skin gets clammy.  The catheter insertion area is bleeding, and the bleeding does not stop when you hold steady pressure on the area.  The area near or just beyond the catheter insertion site becomes pale, cool, tingly, or numb. These symptoms may represent a serious problem that is an emergency. Do not   wait to see if the symptoms will go away. Get medical help right away. Call your local emergency services (911 in the [place] Do not drive yourself to the hospital. Summary  After the procedure, it is common to have bruising that usually fades within 1-2 weeks.  Check your femoral site every day for signs of infection.  Do not lift anything that is heavier than 10 lb (4.5 kg), or the limit that you are told, until your health care provider says that it is safe. This information is not intended to replace advice given to you by your health care provider. Make sure you discuss any questions you have with your health care provider. Document Revised: 03/22/2017 Document Reviewed: 03/22/2017 Elsevier Patient Education  2020 Elsevier Inc.   

## 2020-03-28 NOTE — Progress Notes (Signed)
Site area: Right groin a 6 french long PV sheath was removed and a 7 french venous sheath was removed  Site Prior to Removal:  Level 0  Pressure Applied For 20 MINUTES    Bedrest Beginning at 1110am for 4 hours  Manual:   Yes.    Patient Status During Pull:  stable  Post Pull Groin Site:  Level 0  Post Pull Instructions Given:  Yes.    Post Pull Pulses Present:  Yes.    Dressing Applied:  Yes.    Comments:

## 2020-03-28 NOTE — Interval H&P Note (Signed)
History and Physical Interval Note:  03/28/2020 7:47 AM  Ruben Barrera  has presented today for surgery, with the diagnosis of heart failure.  The various methods of treatment have been discussed with the patient and family. After consideration of risks, benefits and other options for treatment, the patient has consented to  Procedure(s): RIGHT/LEFT HEART CATH AND CORONARY ANGIOGRAPHY (N/A) as a surgical intervention.  The patient's history has been reviewed, patient examined, no change in status, stable for surgery.  I have reviewed the patient's chart and labs.  Questions were answered to the patient's satisfaction.    Cath Lab Visit (complete for each Cath Lab visit)  Clinical Evaluation Leading to the Procedure:   ACS: No.  Non-ACS:    Anginal Classification: No Symptoms  Anti-ischemic medical therapy: Minimal Therapy (1 class of medications)  Non-Invasive Test Results: No non-invasive testing performed  Prior CABG: No previous CABG        Verne Carrow

## 2020-03-29 ENCOUNTER — Encounter (HOSPITAL_COMMUNITY): Payer: Self-pay | Admitting: Cardiovascular Disease

## 2020-04-01 ENCOUNTER — Ambulatory Visit: Payer: PPO | Admitting: Occupational Therapy

## 2020-04-01 ENCOUNTER — Ambulatory Visit: Payer: PPO | Admitting: Physical Therapy

## 2020-04-02 ENCOUNTER — Telehealth: Payer: Self-pay

## 2020-04-02 DIAGNOSIS — M5416 Radiculopathy, lumbar region: Secondary | ICD-10-CM | POA: Diagnosis not present

## 2020-04-02 DIAGNOSIS — M47816 Spondylosis without myelopathy or radiculopathy, lumbar region: Secondary | ICD-10-CM | POA: Diagnosis not present

## 2020-04-02 NOTE — Telephone Encounter (Signed)
   [facility] Pre-operative Risk Assessment    HEARTCARE STAFF: - Please ensure there is not already an duplicate clearance open for this procedure. - Under Visit Info/Reason for Call, type in Other and utilize the format Clearance MM/DD/YY or Clearance TBD. Do not use dashes or single digits. - If request is for dental extraction, please clarify the # of teeth to be extracted.  Request for surgical clearance:  1. What type of surgery is being performed? Spinal Cord Stimulating Trial   2. When is this surgery scheduled? TBD   3. What type of clearance is required (medical clearance vs. Pharmacy clearance to hold med vs. Both)? Medical  4. Are there any medications that need to be held prior to surgery and how long?none listed   5. Practice name and name of physician performing surgery? [facility]   6. What is the office phone number? [phone] x [phone]   7.   What is the office fax number? [phone]  8.   Anesthesia type (None, local, MAC, general) ? Sedation   Chester Romero N Elaura Calix 04/02/2020, 5:46 PM  _________________________________________________________________   (provider comments below)

## 2020-04-03 NOTE — Telephone Encounter (Signed)
Recent cardiac catheterization revealed moderate to severe aortic stenosis, otherwise nonobstructive disease.  Patient has upcoming visit with Dr. Gasper Sells, will defer to MD for clearance. Callback pool to update the reason for upcoming visit to include preop clearance.

## 2020-04-03 NOTE — Telephone Encounter (Signed)
Left message for [facility] nurse that patient's clearance will be addressed at upcoming visit with cardiologist.   Added to appointment notes that clearance is needed

## 2020-04-04 ENCOUNTER — Encounter: Payer: PPO | Attending: Physical Medicine and Rehabilitation | Admitting: Physical Medicine and Rehabilitation

## 2020-04-04 ENCOUNTER — Other Ambulatory Visit: Payer: Self-pay

## 2020-04-04 ENCOUNTER — Encounter: Payer: Self-pay | Admitting: Physical Medicine and Rehabilitation

## 2020-04-04 VITALS — BP 135/83 | HR 74 | Ht 66.0 in | Wt 184.0 lb

## 2020-04-04 DIAGNOSIS — I63529 Cerebral infarction due to unspecified occlusion or stenosis of unspecified anterior cerebral artery: Secondary | ICD-10-CM | POA: Diagnosis not present

## 2020-04-04 DIAGNOSIS — M48062 Spinal stenosis, lumbar region with neurogenic claudication: Secondary | ICD-10-CM | POA: Diagnosis not present

## 2020-04-04 MED ORDER — DULOXETINE HCL 30 MG PO CPEP: 30.0000 mg | ORAL_CAPSULE | Freq: Every day | ORAL | 0 refills | Status: DC

## 2020-04-04 MED ORDER — BACLOFEN 5 MG PO TABS: 5.0000 mg | ORAL_TABLET | Freq: Three times a day (TID) | ORAL | 0 refills | Status: DC | PRN

## 2020-04-04 NOTE — Patient Instructions (Addendum)
1) Ginger 2) Blueberries 3) Salmon 4) Pumpkin seeds 5) dark chocolate 6) turmeric 7) tart cherries 8) virgin olive oil 9) chilli peppers 10) mint   Turmeric to reduce inflammation--can be used in cooking or taken as a supplement.  Benefits of turmeric:  -Highly anti-inflammatory  -Increases antioxidants  -Improves memory, attention, brain disease  -Lowers risk of heart disease  -May help prevent cancer  -Decreases pain  -Alleviates depression  -Delays aging and decreases risk of chronic disease  -Consume with black pepper to increase absorption    Turmeric Milk Recipe:  1 cup milk  1 tsp turmeric  1 tsp cinnamon  1 tsp grated ginger (optional)  Black pepper (boosts the anti-inflammatory properties of turmeric).  1 tsp honey

## 2020-04-04 NOTE — Progress Notes (Signed)
Subjective:    Patient ID: Ruben Barrera, male    DOB: [birth year], 87 y.o.   MRN: [id number]  HPI  Ruben Barrera is a an 87 year old man who presents for follow-up since CIR admission for stroke.  1) Driving: He would like to ask about return to driving.   2) Impaired mobility and ADLs: He has been doing well at home. He has been doing outpatient therapy.  -He has been walking twice per day up and down his driveway.  3) Herat catheterization: Had last Thursday. Needs clearance to return back to therapy.  4) Lower back pain: He does have average pain of 7/10 in his lower back. He has plan to get a spinal cord stimulator and asks when he is cleared for this. He has had ESIs without benefit. He did not get benefit or side effects from Cymbalta.  -His pain is worse with movement.  -He does require a refill of his Baclofen.    Hs wife accompanies him today.    Pain Inventory Average Pain 7 Pain Right Now 7 My pain is dull and aching  LOCATION OF PAIN  Back, Buttocks, groin, hip, thigh, knee, leg,  BOWEL Number of stools per week: 6 Oral laxative use Yes  Type of laxative senna Enema or suppository use No  History of colostomy No  Incontinent No   BLADDER Normal In and out cath, frequency n/a Able to self cath n/a Bladder incontinence No  Frequent urination No  Leakage with coughing No  Difficulty starting stream No  Incomplete bladder emptying No    Mobility walk without assistance walk with assistance use a cane use a walker how many minutes can you walk? 5 ability to climb steps?  yes do you drive?  yes Do you have any goals in this area?  yes  Function retired  Neuro/Psych numbness trouble walking spasms  Prior Studies Any changes since last visit?  no  Physicians involved in your care Any changes since last visit?  no   Family History  Problem Relation Age of Onset  . Stroke Mother   . Hypertension Other        family history   Social  History   Socioeconomic History  . Marital status: Married    Spouse name: Not on file  . Number of children: Not on file  . Years of education: Not on file  . Highest education level: Not on file  Occupational History  . Not on file  Tobacco Use  . Smoking status: Current Every Day Smoker    Types: Cigars  . Smokeless tobacco: Never Used  . Tobacco comment: quit 1972  Vaping Use  . Vaping Use: Never used  Substance and Sexual Activity  . Alcohol use: Yes    Alcohol/week: 0.0 standard drinks    Comment: occasionally beer or scotch  . Drug use: No    Comment: 1 cigar per day, pt states he does not inhale   . Sexual activity: Not Currently  Other Topics Concern  . Not on file  Social History Narrative   Right handed   Lives with wife one story home   Social Determinants of Health   Financial Resource Strain: Not on file  Food Insecurity: Not on file  Transportation Needs: Not on file  Physical Activity: Not on file  Stress: Not on file  Social Connections: Not on file   Past Surgical History:  Procedure Laterality Date  . BACK SURGERY    .  LUMBAR LAMINECTOMY/DECOMPRESSION MICRODISCECTOMY N/A 08/28/2015   Procedure: Lumbar Four-Five decompressive lumbar laminectomy;  Surgeon: Jovita Gamma, MD;  Location: [facility];  Service: Neurosurgery;  Laterality: N/A;  . REPLACEMENT TOTAL KNEE BILATERAL Bilateral 11/05/2003  . RIGHT/LEFT HEART CATH AND CORONARY ANGIOGRAPHY N/A 03/28/2020   Procedure: RIGHT/LEFT HEART CATH AND CORONARY ANGIOGRAPHY;  Surgeon: Burnell Blanks, MD;  Location: [facility];  Service: Cardiovascular;  Laterality: N/A;  . TOE AMPUTATION Right 2010   second toe  . TONSILLECTOMY     Past Medical History:  Diagnosis Date  . Arthritis   . Complication of anesthesia    "had the shivers" after surgery  . Depression   . Dysrhythmia    "extra heart beat"  . GERD (gastroesophageal reflux disease)   . History of hiatal hernia   .  Hypercholesteremia   . Hypertension   . Lumbar spinal stenosis   . RLS (restless legs syndrome)    takes ativan as needed   BP 135/83   Pulse 74   Ht 5\' 6"  (1.676 m)   Wt 184 lb (83.5 kg)   SpO2 97%   BMI 29.70 kg/m   Opioid Risk Score:   Fall Risk Score:  `1  Depression screen PHQ 2/9  Depression screen PHQ 2/9 03/05/2020  Decreased Interest 0  Down, Depressed, Hopeless 0  PHQ - 2 Score 0  Altered sleeping 0  Tired, decreased energy 0  Change in appetite 0  Feeling bad or failure about yourself  0  Trouble concentrating 0  Moving slowly or fidgety/restless 1  Suicidal thoughts 0  PHQ-9 Score 1    Review of Systems  Constitutional: Negative.   HENT: Negative.   Eyes: Negative.   Respiratory: Negative.   Cardiovascular: Negative.   Gastrointestinal: Negative.   Endocrine: Negative.   Genitourinary: Negative.   Musculoskeletal: Positive for arthralgias, back pain and gait problem.       Spasms   Skin: Negative.   Allergic/Immunologic: Negative.   Neurological: Positive for numbness.  Hematological: Negative.   Psychiatric/Behavioral: Negative.   All other systems reviewed and are negative.      Objective:   Physical Exam Gen: no distress, normal appearing HEENT: oral mucosa pink and moist, NCAT Cardio: Reg rate Chest: normal effort, normal rate of breathing Abd: soft, non-distended Ext: no edema Psych: pleasant, normal affect Skin: intact Neuro: Alert and oriented x3 Musculoskeletal: 4/5 strength right leg. + slump test right side. Flexion and extension limited 25%.  Psych: pleasant, normal affect    Assessment & Plan:  Ruben Barrera is an 87 year old man who presents for hospital follow-up after CIR admission for stroke.   1) s/p ACA stroke -doing well at home.  -continue outpatient therapy cleared to restart.  -Advised to increase walking distance to improve strength and balance -Continue biking three times per week to improve  strength. -Postpone spinal cord stimulator until 3 months post-stroke.  RETURN TO DRIVING PLAN:  WITH THE SUPERVISION OF A LICENSED DRIVER, PLEASE DRIVE IN AN EMPTY PARKING LOT FOR AT LEAST 2-3 TRIALS TO TEST REACTION TIME, VISION, USE OF EQUIPMENT IN CAR, ETC.  IF SUCCESSFUL WITH THE PARKING LOT DRIVING, PROCEED TO SUPERVISED DRIVING TRIALS IN YOUR NEIGHBORHOOD STREETS AT LOW TRAFFIC TIMES TO TEST OBSERVATION TO TRAFFIC SIGNALS, REACTION TIME, ETC. PLEASE ATTEMPT AT LEAST 2-3 TRIALS IN YOUR NEIGHBORHOOD.  IF NEIGHBORHOOD DRIVING IS SUCCESSFUL, YOU MAY PROCEED TO DRIVING IN BUSIER AREAS IN YOUR COMMUNITY WITH SUPERVISION OF A  LICENSED DRIVER. PLEASE ATTEMPT AT LEAST 4-5 TRIALS.  IF COMMUNITY DRIVING IS SUCCESSFUL, YOU MAY PROCEED TO DRIVING ALONE, DURING THE DAY TIME.  2) Chronic Pain Syndrome secondary to lumbar stenosis with neurogenic claudication -Discussed current symptoms of pain and history of pain.  -Discussed benefits of exercise in reducing pain. -Increase Cymbalta to 30mg .  -Cleared for spinal cord stimulator if he chooses to pursue, recommend maximizing medication management first given his age and risk for complications.  -Discussed following foods that may reduce pain: 1) Ginger 2) Blueberries 3) Salmon 4) Pumpkin seeds 5) dark chocolate 6) turmeric 7) tart cherries 8) virgin olive oil 9) chilli peppers 10) mint  Turmeric to reduce inflammation--can be used in cooking or taken as a supplement.  Benefits of turmeric:  -Highly anti-inflammatory  -Increases antioxidants  -Improves memory, attention, brain disease  -Lowers risk of heart disease  -May help prevent cancer  -Decreases pain  -Alleviates depression  -Delays aging and decreases risk of chronic disease  -Consume with black pepper to increase absorption    Turmeric Milk Recipe:  1 cup milk  1 tsp turmeric  1 tsp cinnamon  1 tsp grated ginger (optional)  Black pepper (boosts the  anti-inflammatory properties of turmeric).  1 tsp honey

## 2020-04-05 ENCOUNTER — Ambulatory Visit: Payer: PPO | Admitting: Physical Therapy

## 2020-04-05 ENCOUNTER — Ambulatory Visit: Payer: PPO | Attending: Physician Assistant | Admitting: Occupational Therapy

## 2020-04-05 ENCOUNTER — Encounter: Payer: Self-pay | Admitting: Physical Therapy

## 2020-04-05 ENCOUNTER — Encounter: Payer: Self-pay | Admitting: Occupational Therapy

## 2020-04-05 DIAGNOSIS — R2681 Unsteadiness on feet: Secondary | ICD-10-CM | POA: Diagnosis not present

## 2020-04-05 DIAGNOSIS — R41844 Frontal lobe and executive function deficit: Secondary | ICD-10-CM

## 2020-04-05 DIAGNOSIS — R293 Abnormal posture: Secondary | ICD-10-CM | POA: Insufficient documentation

## 2020-04-05 DIAGNOSIS — M6281 Muscle weakness (generalized): Secondary | ICD-10-CM

## 2020-04-05 DIAGNOSIS — R262 Difficulty in walking, not elsewhere classified: Secondary | ICD-10-CM

## 2020-04-05 DIAGNOSIS — R2689 Other abnormalities of gait and mobility: Secondary | ICD-10-CM | POA: Diagnosis not present

## 2020-04-05 NOTE — Therapy (Signed)
[facility] [address] [place], [place], [address] Phone: [phone]   Fax:  [phone]  Occupational Therapy Treatment  Patient Details  Name: Ruben Barrera MRN: [id number] Date of Birth: 03/15/1933 Referring Provider (OT): Dr. Swartz/ Dr. Ranell Patrick   Encounter Date: 04/05/2020   OT End of Session - 04/05/20 1219    Visit Number 5    Number of Visits 25   will likely d/c after 8 weeks depending on progress.   Date for OT Re-Evaluation 05/30/20    Authorization Type [employer]    Authorization Time Period pt only scheduled for 2x week x 4 weeks, once ST order obtained- add an additional 2x week x 4 weeks for PT/OT as well as ST visits    Authorization - Visit Number 5    Authorization - Number of Visits 10    Progress Note Due on Visit 10    OT Start Time 1148    OT Stop Time 1226    OT Time Calculation (min) 38 min    Activity Tolerance Treatment limited secondary to agitation    Behavior During Therapy Impulsive;Restless           Past Medical History:  Diagnosis Date  . Arthritis   . Complication of anesthesia    "had the shivers" after surgery  . Depression   . Dysrhythmia    "extra heart beat"  . GERD (gastroesophageal reflux disease)   . History of hiatal hernia   . Hypercholesteremia   . Hypertension   . Lumbar spinal stenosis   . RLS (restless legs syndrome)    takes ativan as needed    Past Surgical History:  Procedure Laterality Date  . BACK SURGERY    . LUMBAR LAMINECTOMY/DECOMPRESSION MICRODISCECTOMY N/A 08/28/2015   Procedure: Lumbar Four-Five decompressive lumbar laminectomy;  Surgeon: Jovita Gamma, MD;  Location: [facility];  Service: Neurosurgery;  Laterality: N/A;  . REPLACEMENT TOTAL KNEE BILATERAL Bilateral 11/05/2003  . RIGHT/LEFT HEART CATH AND CORONARY ANGIOGRAPHY N/A 03/28/2020   Procedure: RIGHT/LEFT HEART CATH AND CORONARY ANGIOGRAPHY;  Surgeon: Burnell Blanks, MD;  Location: [facility];  Service: Cardiovascular;  Laterality: N/A;  . TOE AMPUTATION Right 2010   second toe  . TONSILLECTOMY      There were no vitals filed for this visit.   Subjective Assessment - 04/05/20 1152    Patient Stated Goals improve balance    Currently in Pain? Yes    Pain Score 6     Pain Location Back    Pain Orientation Right;Left    Pain Type Chronic pain    Pain Onset More than a month ago    Pain Frequency Intermittent    Aggravating Factors  walking    Pain Relieving Factors heat                Treatment: Therapist started checking progress towards short term goals. See goals Pt wrote a sentence with 100% legibility with increased time. ambulating with walker while performing category generation for foods, mod v.c, no LOB. Pt continues to move quickly and impulsively Sequencing functional tasks on constant therapy 92 % reponse time 34.82 Alphabetizing words upper and lower case alternately, increased difficulty, p would not start task until therapist stopped tpeaking with a front office member. Therapist reinforce that pt needs to work on ability to sustain attention in a busy environment. Pt disagreed.  OT Education - 04/05/20 1158    Education Details reviewed graduated driving recommendations from MD and that how he perfroms with this would determine whether he is ready to drive    Person(s) Educated Patient    Methods Explanation    Comprehension Verbalized understanding            OT Short Term Goals - 04/05/20 1200      OT SHORT TERM GOAL #1   Title I with inital HEP.    Time 4    Period Weeks    Status Achieved   pt reports using putty   Target Date 04/04/20      OT SHORT TERM GOAL #2   Title Pt will increase bilateral grip strength by 5 lbs for increased ease with ADLs.    Time 4    Period Weeks    Status Achieved   R 45, L 41     OT SHORT TERM GOAL #3   Title Pt will write  a sentence with 90% legibility    Time 4    Period Weeks    Status On-going      OT SHORT TERM GOAL #4   Title Pt will demonstrate ability to sequence a functional task/ home management task demonstrating good safety awareness, with no more than min v.c    Time 4    Period Weeks    Status On-going      OT SHORT TERM GOAL #5   Title Pt will ambulate while perfoming a physical/ cognitive task, with 80% accuracy and no LOB.    Baseline decreased alternating attention, slowed processing    Time 4    Period Weeks    Status On-going   no LOB, mod v.c for category generation     OT SHORT TERM GOAL #6   Title Further assess cognition in functional context and add goal prn.    Time 4    Period Weeks    Status Deferred             OT Long Term Goals - 04/05/20 1213      OT LONG TERM GOAL #1   Title I with updated HEP for right UE proximal strength    Time 12    Period Weeks    Status On-going      OT LONG TERM GOAL #2   Title Pt will increase standing functional reach to 10 inches or greater to minimize fall risk for ADLS.    Baseline R 8 inches, L 9 inches    Time 12    Period Weeks    Status On-going      OT LONG TERM GOAL #3   Title Pt will write a short paragraph with 100% legibility.    Baseline 75% at sentence level    Time 12    Period Weeks    Status On-going      OT LONG TERM GOAL #4   Title Pt will demonstrate ability to perform a physical and cognive task simultaneously with 90% or better accuracy and no LOB.    Time 12    Period Weeks    Status On-going      OT LONG TERM GOAL #5   Title Pt will sequence a functional task/ home managment task and complete modified independently.    Baseline slowed processing, difficulty following instructions decreased attention.    Time 12    Status On-going      OT LONG TERM  GOAL #6   Title Pt / wife will verbalize understanding of return to driving recommendations.    Time 12    Period Weeks    Status On-going                  Plan - 04/05/20 1221    Clinical Impression Statement Pt continues to  demo decr awareness of deficits. Pt was cleared by MD for graduated driving. Pt's wife was not here to discuss this. Pt demo decr attention to detail, decr alternating attention, and decr organization/problem solving.  Pt is progressing with writing.    OT Occupational Profile and History Detailed Assessment- Review of Records and additional review of physical, cognitive, psychosocial history related to current functional performance    Occupational performance deficits (Please refer to evaluation for details): ADL's;IADL's;Work;Leisure;Play;Social Participation    Body Structure / Function / Physical Skills ADL;Balance;UE functional use;ROM;Gait;GMC;Decreased knowledge of precautions;Coordination;IADL;Decreased knowledge of use of DME;Strength;Dexterity;Mobility    Cognitive Skills Attention;Memory;Problem Solve;Safety Awareness;Sequencing;Temperament/Personality;Thought;Understand    Rehab Potential Good    Clinical Decision Making Limited treatment options, no task modification necessary    Comorbidities Affecting Occupational Performance: May have comorbidities impacting occupational performance    Modification or Assistance to Complete Evaluation  No modification of tasks or assist necessary to complete eval    OT Frequency 2x / week   plus eval anticipate d/c after 8 weeks   OT Duration 12 weeks    OT Treatment/Interventions Self-care/ADL training;Ultrasound;Energy conservation;Patient/family education;DME and/or AE instruction;Aquatic Therapy;Paraffin;Gait Training;Passive range of motion;Balance training;Fluidtherapy;Cryotherapy;Electrical Stimulation;Contrast Bath;Functional Mobility Training;Therapeutic activities;Manual Therapy;Therapeutic exercise;Moist Heat;Neuromuscular education;Cognitive remediation/compensation    Plan continue to address functional cognition, discuss graded driving and  concerns regarding driving if pt's wife is present    Consulted and Agree with Plan of Care Patient           Patient will benefit from skilled therapeutic intervention in order to improve the following deficits and impairments:   Body Structure / Function / Physical Skills: ADL,Balance,UE functional use,ROM,Gait,GMC,Decreased knowledge of precautions,Coordination,IADL,Decreased knowledge of use of DME,Strength,Dexterity,Mobility Cognitive Skills: Attention,Memory,Problem Solve,Safety Awareness,Sequencing,Temperament/Personality,Thought,Understand     Visit Diagnosis: Muscle weakness (generalized)  Difficulty in walking, not elsewhere classified  Other abnormalities of gait and mobility  Frontal lobe and executive function deficit    Problem List Patient Active Problem List   Diagnosis Date Noted  . Nonrheumatic aortic valve stenosis 03/19/2020  . Dyslipidemia   . Chronic systolic congestive heart failure ([facility])   . Acute ischemic cerebrovascular accident (CVA) involving anterior cerebral artery territory ([facility]) 02/22/2020  . Mixed hyperlipidemia   . Tobacco abuse   . Depression   . Acute stroke due to occlusion of right cerebellar artery ([facility]) 02/20/2020  . Erythrocytosis 02/20/2020  . BPH (benign prostatic hyperplasia) 02/20/2020  . HNP (herniated nucleus pulposus), lumbar 01/13/2016  . Lumbar stenosis with neurogenic claudication 08/28/2015  . DOE (dyspnea on exertion) 05/01/2014  . Essential hypertension 05/01/2014  . Left shoulder pain 05/05/2011    Evamaria Detore 04/05/2020, 2:40 PM  [facility] [address] [place], [place], [address] Phone: [phone]   Fax:  [phone]  Name: Ruben Barrera MRN: [id number] Date of Birth: 29-Apr-1932

## 2020-04-05 NOTE — Therapy (Signed)
[facility] [address] [place], [place], [address] Phone: [phone]   Fax:  [phone]  Physical Therapy Treatment and Hold  Patient Details  Name: Ruben Barrera MRN: [id number] Date of Birth: [birth year] Referring Provider (PT): Cathlyn Parsons, PA-C   Encounter Date: 04/05/2020   PT End of Session - 04/05/20 1202    Visit Number 5    Number of Visits 9    Date for PT Re-Evaluation 04/04/20    Authorization Type [employer]    PT Start Time 1230    PT Stop Time 1315    PT Time Calculation (min) 45 min    Equipment Utilized During Treatment Gait belt    Activity Tolerance Patient tolerated treatment well;Patient limited by pain;Patient limited by fatigue   seated rest breaks taken as necessary   Behavior During Therapy Impulsive;WFL for tasks assessed/performed           Past Medical History:  Diagnosis Date  . Arthritis   . Complication of anesthesia    "had the shivers" after surgery  . Depression   . Dysrhythmia    "extra heart beat"  . GERD (gastroesophageal reflux disease)   . History of hiatal hernia   . Hypercholesteremia   . Hypertension   . Lumbar spinal stenosis   . RLS (restless legs syndrome)    takes ativan as needed    Past Surgical History:  Procedure Laterality Date  . BACK SURGERY    . LUMBAR LAMINECTOMY/DECOMPRESSION MICRODISCECTOMY N/A 08/28/2015   Procedure: Lumbar Four-Five decompressive lumbar laminectomy;  Surgeon: Jovita Gamma, MD;  Location: [facility];  Service: Neurosurgery;  Laterality: N/A;  . REPLACEMENT TOTAL KNEE BILATERAL Bilateral 11/05/2003  . RIGHT/LEFT HEART CATH AND CORONARY ANGIOGRAPHY N/A 03/28/2020   Procedure: RIGHT/LEFT HEART CATH AND CORONARY ANGIOGRAPHY;  Surgeon: Burnell Blanks, MD;  Location: [facility];  Service: Cardiovascular;  Laterality: N/A;  . TOE AMPUTATION Right 2010   second toe  . TONSILLECTOMY      There  were no vitals filed for this visit.   Subjective Assessment - 04/05/20 1233    Subjective Pt states his back is feeling better. He states he's been doing the PT exercises. Pt states he is waiting for cardiology approval for his spinal stimulator.    Patient is accompained by: Family member    Limitations Walking    How long can you walk comfortably? 2-3 loops down the drive way with the walker    Patient Stated Goals Walking outside on unlevel; get back to using the cane exclusively.    Currently in Pain? Yes    Pain Score 4     Pain Location Back    Pain Orientation Right;Left;Lower    Pain Onset More than a month ago    Pain Frequency Intermittent              [facility] PT Assessment - 04/05/20 0001      Ambulation/Gait   Ambulation/Gait Yes    Ambulation/Gait Assistance 5: Supervision    Ambulation Distance (Feet) 330 Feet    Assistive device Small based quad cane    Gait Pattern Antalgic;Right flexed knee in stance;Left flexed knee in stance;Step-through pattern;Trunk flexed;Right foot flat;Left foot flat;Poor foot clearance - right;Poor foot clearance - left    Ambulation Surface Level;Indoor    Gait velocity 16.47 sec = 1.99 ft/sec      Furniture conservator/restorer   Sit to Stand Able  to stand without using hands and stabilize independently    Standing Unsupported Able to stand safely 2 minutes    Sitting with Back Unsupported but Feet Supported on Floor or Stool Able to sit safely and securely 2 minutes    Stand to Sit Controls descent by using hands    Transfers Able to transfer safely, minor use of hands    Standing Unsupported with Eyes Closed Able to stand 10 seconds with supervision    Standing Unsupported with Feet Together Able to place feet together independently and stand for 1 minute with supervision    From Standing, Reach Forward with Outstretched Arm Can reach confidently >25 cm (10")    From Standing Position, Pick up Object from [place] to pick up shoe safely and  easily    From Standing Position, Turn to Look Behind Over each Shoulder Looks behind from both sides and weight shifts well    Turn 360 Degrees Able to turn 360 degrees safely one side only in 4 seconds or less    Standing Unsupported, Alternately Place Feet on Step/Stool Able to stand independently and safely and complete 8 steps in 20 seconds    Standing Unsupported, One Foot in Front Able to plae foot ahead of the other independently and hold 30 seconds    Standing on One Leg Able to lift leg independently and hold equal to or more than 3 seconds    Total Score 49    Berg comment: 49/56      Dynamic Gait Index   Level Surface Mild Impairment    Change in Gait Speed Normal    Gait with Horizontal Head Turns Mild Impairment    Gait with Vertical Head Turns Normal    Gait and Pivot Turn Normal    Step Over Obstacle Normal    Step Around Obstacles Normal    Steps Normal    Total Score 22    DGI comment: 22/24                                   PT Short Term Goals - 03/21/20 1114      PT SHORT TERM GOAL #1   Title Pt will be independent with initial HEP    Time 2    Period Weeks    Status New    Target Date 03/21/20      PT SHORT TERM GOAL #2   Title PT will be able to complete FGA and create appropriate goal within the next 1-2 visits    Baseline DGA completed; pt scored 15/24    Time 2    Period Weeks    Status Achieved    Target Date 03/21/20             PT Long Term Goals - 04/05/20 1530      PT LONG TERM GOAL #1   Title Pt will be independent with balance exercises at home    Time 4    Period Weeks    Status Achieved      PT LONG TERM GOAL #2   Title Pt will have improved gait speed by at least .4 ft/sec to demo minimal clinically important difference    Baseline 1.99 ft/ sec on 04/05/20    Time 4    Period Weeks    Status Deferred      PT LONG TERM GOAL #3   Title  Pt will have improved Berg Balance score to at least 45/56 to  demo decreased fall risk    Baseline 42/56 on eval; 49/56 on 04/05/20    Time 4    Period Weeks    Status Achieved      PT LONG TERM GOAL #4   Title Pt's FOTO score will remain at or above 70%    Time 4    Period Weeks    Status Deferred      PT LONG TERM GOAL #5   Title Pt will be able to demo safe mobility with LRAD outdoors on unlevel surfaces for >1000' for improved community mobility    Baseline Currently just making loops to the mailbox on level ground (Pt's major goal is to return to his walking); pt reports using SPC primarily for mobility now    Time 4    Period Weeks    Status Partially Met      PT LONG TERM GOAL #6   Title Pt will have improved DGI to at least 20/24 to demo decreased fall risk    Baseline 15/24; 22/24 on 04/05/20    Time 4    Period Weeks    Status Achieved                 Plan - 04/05/20 1534    Clinical Impression Statement Mr. Leasure demos improving balance. He has been compliant with his last HEP. Pt has yet to be approved for his spinal stimulator; however, he agrees to hold PT until then. He was educated on an updated HEP to continue to work on his balance at home. Pt has met LTGs 1, 3, and 6. Gait speed goal has been deferred as no baseline gait velocity was calculated; however, pt is demonstrating improved stability with his cane. Pt reports he has been using cane safely in community setting but unsure if this is >1000' for LTG #5. At this time, pt has met the majority of his goals and is ready to be held for therapy.    Personal Factors and Comorbidities Age;Comorbidity 1;Comorbidity 2;Time since onset of injury/illness/exacerbation    Comorbidities chronic back pain from lumbar stenosis, HTN, arthritis    Examination-Activity Limitations Locomotion Level;Stand;Stairs;Squat    Examination-Participation Restrictions Community Activity;Driving;Yard Work    [employer] Evolving/Moderate complexity    Rehab Potential  Good    PT Frequency 2x / week    PT Duration 4 weeks   plan for pt to schedule another 4 wks after he gets his Speech therapy consult   PT Treatment/Interventions ADLs/Self Care Home Management;Aquatic Therapy;Electrical Stimulation;Cryotherapy;Moist Heat;DME Instruction;Gait training;Stair training;Functional mobility training;Therapeutic activities;Therapeutic exercise;Balance training;Neuromuscular re-education;Iontophoresis 21m/ml Dexamethasone;Patient/family education;Manual techniques;Passive range of motion;Dry needling;Taping;Vasopneumatic Device;Vestibular    PT Next Visit Plan Plan to hold PT until after pt gets the wires for his back pain. Please provide one final HEP that he can perform until his procedure. Continue gait training/dynamic gait, standing balance and dynamic balance in // bars. functional BLE strengthening (more so LLE>RLE). Please reiterate/reinforce everything as pt may not remember.    PT Home Exercise [facility]   Consulted and Agree with Plan of Care Patient           Patient will benefit from skilled therapeutic intervention in order to improve the following deficits and impairments:  Abnormal gait,Difficulty walking,Decreased activity tolerance,Pain,Decreased balance,Hypomobility,Impaired flexibility,Improper body mechanics,Decreased mobility,Decreased strength,Postural dysfunction  Visit Diagnosis: Unsteadiness on feet  Muscle weakness (generalized)  Difficulty in  walking, not elsewhere classified  Other abnormalities of gait and mobility  Abnormal posture     Problem List Patient Active Problem List   Diagnosis Date Noted  . Nonrheumatic aortic valve stenosis 03/19/2020  . Dyslipidemia   . Chronic systolic congestive heart failure ([place])   . Acute ischemic cerebrovascular accident (CVA) involving anterior cerebral artery territory ([place]) 02/22/2020  . Mixed hyperlipidemia   . Tobacco abuse   . Depression   . Acute stroke due to  occlusion of right cerebellar artery ([place]) 02/20/2020  . Erythrocytosis 02/20/2020  . BPH (benign prostatic hyperplasia) 02/20/2020  . HNP (herniated nucleus pulposus), lumbar 01/13/2016  . Lumbar stenosis with neurogenic claudication 08/28/2015  . DOE (dyspnea on exertion) 05/01/2014  . Essential hypertension 05/01/2014  . Left shoulder pain 05/05/2011    [facility] [address] [place] PT, DPT 04/05/2020, 3:40 PM  [facility] [address] [place], [place], [address] Phone: [phone]   Fax:  [phone]  Name: Ruben Barrera MRN: [id number] Date of Birth: [birth year]

## 2020-04-10 DIAGNOSIS — M5431 Sciatica, right side: Secondary | ICD-10-CM | POA: Diagnosis not present

## 2020-04-10 DIAGNOSIS — I10 Essential (primary) hypertension: Secondary | ICD-10-CM | POA: Diagnosis not present

## 2020-04-14 DIAGNOSIS — G894 Chronic pain syndrome: Secondary | ICD-10-CM | POA: Diagnosis not present

## 2020-04-15 ENCOUNTER — Encounter: Payer: Self-pay | Admitting: Internal Medicine

## 2020-04-15 ENCOUNTER — Ambulatory Visit: Payer: PPO | Admitting: Internal Medicine

## 2020-04-15 ENCOUNTER — Other Ambulatory Visit: Payer: Self-pay

## 2020-04-15 VITALS — BP 90/50 | HR 84 | Ht 66.0 in | Wt 186.4 lb

## 2020-04-15 DIAGNOSIS — E782 Mixed hyperlipidemia: Secondary | ICD-10-CM | POA: Diagnosis not present

## 2020-04-15 DIAGNOSIS — I5020 Unspecified systolic (congestive) heart failure: Secondary | ICD-10-CM | POA: Diagnosis not present

## 2020-04-15 DIAGNOSIS — I2510 Atherosclerotic heart disease of native coronary artery without angina pectoris: Secondary | ICD-10-CM | POA: Diagnosis not present

## 2020-04-15 DIAGNOSIS — Z01810 Encounter for preprocedural cardiovascular examination: Secondary | ICD-10-CM | POA: Diagnosis not present

## 2020-04-15 DIAGNOSIS — I493 Ventricular premature depolarization: Secondary | ICD-10-CM | POA: Insufficient documentation

## 2020-04-15 DIAGNOSIS — I350 Nonrheumatic aortic (valve) stenosis: Secondary | ICD-10-CM

## 2020-04-15 DIAGNOSIS — I471 Supraventricular tachycardia: Secondary | ICD-10-CM | POA: Insufficient documentation

## 2020-04-15 DIAGNOSIS — Z720 Tobacco use: Secondary | ICD-10-CM

## 2020-04-15 MED ORDER — ALPRAZOLAM 0.5 MG PO TABS: 0.5000 mg | ORAL_TABLET | ORAL | 0 refills | Status: DC

## 2020-04-15 NOTE — Patient Instructions (Addendum)
Medication Instructions:   Discontinue Losartan   *If you need a refill on your cardiac medications before your next appointment, please call your pharmacy*   Lab Work: None ordered   If you have labs (blood work) drawn today and your tests are completely normal, you will receive your results only by: Marland Kitchen MyChart Message (if you have MyChart) OR . A paper copy in the mail If you have any lab test that is abnormal or we need to change your treatment, we will call you to review the results.   Testing/Procedures: Your physician has ordered for you to have a Cardiac MRI at [facility]. We have sent a one time prescription for Xanax 0.5 mg for you to use 1/2 a hour before your MRI  Follow-Up: At [facility], you and your health needs are our priority.  As part of our continuing mission to provide you with exceptional heart care, we have created designated Provider Care Teams.  These Care Teams include your primary Cardiologist (physician) and Advanced Practice Providers (APPs -  Physician Assistants and Nurse Practitioners) who all work together to provide you with the care you need, when you need it.  We recommend signing up for the patient portal called "MyChart".  Sign up information is provided on this After Visit Summary.  MyChart is used to connect with patients for Virtual Visits (Telemedicine).  Patients are able to view lab/test results, encounter notes, upcoming appointments, etc.  Non-urgent messages can be sent to your provider as well.   To learn more about what you can do with MyChart, go to [email or web address].    Your next appointment:   3 month(s)  The format for your next appointment:   In Person  Provider:   You may see Dr. Rudean Haskell or one of the following Advanced Practice Providers on your designated Care Team:    Melina Copa, PA-C  Ermalinda Barrios, PA-C    Other Instructions None

## 2020-04-15 NOTE — Progress Notes (Signed)
Cardiology Office Note:    Date:  04/15/2020   ID:  Ruben Barrera, DOB [birth year], MRN [id number]  PCP:  Lajean Manes, MD  [facility] Cardiologist:  Ena Dawley, MD  [facility] Electrophysiologist:  None   CC: Preoperative risk stratification Spinal Cord Stimulating Trial  [facility]  Fax: [phone] Unclear sedation; will assume general as this would have the greatest risk  History of Present Illness:    Ruben Barrera is a 87 y.o. male with a hx of HTN, HLD, Non-obstructive CAD without intervention, HFrEF,  LFLG-AS, right CVA stroke, frequent PVCs and SVT, and Tobacco Abuse (occasional cigar) who presented for evaluation 04/19/20.  In interval had LHC 03/28/20 with diagnosis of CAD and AS.     Patient notes that he is doing fine.  With Low BP today has no symptoms.  Since day prior/last visit notes worsening back pain.  Relevant interval testing or therapy include LHC.    No chest pain or pressure .  No SOB/DOE and no PND/Orthopnea.  No weight gain or leg swelling.  No palpitations or syncope .  Is still working out with PT and at home.  No change in respiratory tolerance  Ambulatory blood pressure 102/60.   Past Medical History:  Diagnosis Date  . Arthritis   . Complication of anesthesia    "had the shivers" after surgery  . Depression   . Dysrhythmia    "extra heart beat"  . GERD (gastroesophageal reflux disease)   . History of hiatal hernia   . Hypercholesteremia   . Hypertension   . Lumbar spinal stenosis   . RLS (restless legs syndrome)    takes ativan as needed    Past Surgical History:  Procedure Laterality Date  . BACK SURGERY    . LUMBAR LAMINECTOMY/DECOMPRESSION MICRODISCECTOMY N/A 08/28/2015   Procedure: Lumbar Four-Five decompressive lumbar laminectomy;  Surgeon: Jovita Gamma, MD;  Location: [facility];  Service: Neurosurgery;  Laterality: N/A;  . REPLACEMENT TOTAL KNEE BILATERAL Bilateral 11/05/2003  . RIGHT/LEFT HEART  CATH AND CORONARY ANGIOGRAPHY N/A 03/28/2020   Procedure: RIGHT/LEFT HEART CATH AND CORONARY ANGIOGRAPHY;  Surgeon: Burnell Blanks, MD;  Location: [facility];  Service: Cardiovascular;  Laterality: N/A;  . TOE AMPUTATION Right 2010   second toe  . TONSILLECTOMY      Current Medications: Current Meds  Medication Sig  . acetaminophen (TYLENOL) 325 MG tablet Take 2 tablets (650 mg total) by mouth every 4 (four) hours as needed for mild pain (or temp > 37.5 C (99.5 F)).  Marland Kitchen aspirin EC 81 MG EC tablet Take 1 tablet (81 mg total) by mouth daily. Swallow whole.  . Baclofen 5 MG TABS Take 5 mg by mouth 3 (three) times daily as needed.  . celecoxib (CELEBREX) 200 MG capsule Take 200 mg by mouth daily.  . citalopram (CELEXA) 10 MG tablet Take 1 tablet (10 mg total) by mouth daily.  . DULoxetine (CYMBALTA) 30 MG capsule Take 1 capsule (30 mg total) by mouth daily.  Marland Kitchen ezetimibe (ZETIA) 10 MG tablet Take 1 tablet (10 mg total) by mouth every evening.  . fluticasone (FLONASE) 50 MCG/ACT nasal spray Place 1 spray into both nostrils daily as needed for allergies.   . furosemide (LASIX) 20 MG tablet Take 1 tablet (20 mg total) by mouth daily.  Marland Kitchen gabapentin (NEURONTIN) 400 MG capsule Take 400 mg by mouth daily.  Marland Kitchen gabapentin (NEURONTIN) 600 MG tablet Take 1 tablet (600  mg total) by mouth 3 (three) times daily.  Marland Kitchen HYDROcodone-acetaminophen (NORCO) 7.5-325 MG tablet Take 1 tablet by mouth every 8 (eight) hours as needed for moderate pain.  Marland Kitchen loratadine (CLARITIN) 10 MG tablet Take 10 mg by mouth daily in the afternoon.  Marland Kitchen LORazepam (ATIVAN) 1 MG tablet Take 1 tablet (1 mg total) by mouth every 8 (eight) hours as needed for anxiety.  . Magnesium 250 MG TABS Take 1 tablet by mouth daily in the afternoon.  . metoprolol succinate (TOPROL-XL) 25 MG 24 hr tablet Take 1/2 tablet  (12.5 mg total) by mouth daily  . MULTIPLE VITAMIN PO Take 1 tablet by mouth daily.   Marland Kitchen omeprazole (PRILOSEC) 20 MG capsule  Take 20 mg by mouth daily.  . pravastatin (PRAVACHOL) 20 MG tablet Take 1 tablet (20 mg total) by mouth daily at 6 PM.  . saccharomyces boulardii (FLORASTOR) 250 MG capsule Take 250 mg by mouth daily in the afternoon.  . senna-docusate (SENOKOT-S) 8.6-50 MG tablet Take 1 tablet by mouth 2 (two) times daily.  Marland Kitchen spironolactone (ALDACTONE) 25 MG tablet Take 0.5 tablets (12.5 mg total) by mouth daily.  . tamsulosin (FLOMAX) 0.4 MG CAPS capsule Take 1 capsule (0.4 mg total) by mouth daily.  Marland Kitchen triamcinolone (KENALOG) 0.1 % Apply 1 application topically daily as needed (irritation).     Allergies:   Simvastatin and Other   Social History   Socioeconomic History  . Marital status: Married    Spouse name: Not on file  . Number of children: Not on file  . Years of education: Not on file  . Highest education level: Not on file  Occupational History  . Not on file  Tobacco Use  . Smoking status: Current Every Day Smoker    Types: Cigars  . Smokeless tobacco: Never Used  . Tobacco comment: quit 1972  Vaping Use  . Vaping Use: Never used  Substance and Sexual Activity  . Alcohol use: Yes    Alcohol/week: 0.0 standard drinks    Comment: occasionally beer or scotch  . Drug use: No    Comment: 1 cigar per day, pt states he does not inhale   . Sexual activity: Not Currently  Other Topics Concern  . Not on file  Social History Narrative   Right handed   Lives with wife one story home   Social Determinants of Health   Financial Resource Strain: Not on file  Food Insecurity: Not on file  Transportation Needs: Not on file  Physical Activity: Not on file  Stress: Not on file  Social Connections: Not on file    Former CEO of [employer]  Family History: The patient's family history includes Hypertension in an other family member; Stroke in his mother.  No history of congestive heart failure  ROS:   Please see the history of present illness.    All other systems reviewed and are  negative.  EKGs/Labs/Other Studies Reviewed:    The following studies were reviewed today:  EKG:   02/21/20:  Sinus Rhythm Rate 84 with Rare PVCs, and PACs, QTc 514 ms  Cardiac Event Monitoring: Date 03/25/2020 Personally reviewed Results:   Patient had a minimum heart rate of 44 bpm, maximum heart rate of 171 bpm, and average heart rate of 68 bpm.  Predominant underlying rhythm was sinus rhythm.  Three runs of ventricular tachycardia occurred lasting 11 beats at longest with a max rate of 143 bpm.  Eighty-seven runs of supraventricular tachycardia occurred, lasting  17.1 seconds at longest with a max rate of 171 bpm at fastest.  Isolated PACs were occasional (4.2%), with rare couplets and triplets present.  Isolated PVCs were frequent (5.1%), with rare couplets, triplets, bigeminy, and trigeminy present.  No evidence of complete heart block.  No triggered and diary events.   Frequent PVCs, SVT, with runs of NSVT.   Transthoracic Echocardiogram: Date:02/21/20 Results: 1. Left ventricular ejection fraction, by estimation, is 30 to 35%. The  left ventricle has moderately decreased function. The left ventricle  demonstrates global hypokinesis. The left ventricular internal cavity size  was mildly dilated. There is severe  concentric left ventricular hypertrophy. Left ventricular diastolic  parameters are indeterminate.  2. Right ventricular systolic function is normal. The right ventricular  size is normal.  3. Left atrial size was severely dilated.  4. The mitral valve is grossly normal. Mild mitral valve regurgitation.  5. The aortic valve is calcified. Aortic valve regurgitation is not  visualized. Mild to moderate aortic valve stenosis. Aortic valve mean  gradient measures 25.0 mmHg.   FINDINGS  Left Ventricle: LVMI 197 g/m2 RWT 0.54. Left ventricular ejection fraction, by estimation, is 30 to 35%.  The left ventricle has moderately decreased function. The left  ventricle  demonstrates global hypokinesis. The left ventricular internal cavity size  was mildly dilated. There is severe concentric left ventricular hypertrophy. Left ventricular diastolic parameters are indeterminate.   NM Stress Testing : Date: 02/21/2020 Results:  Notes Recorded by Lars Masson, MD on 05/10/2014 at 2:50 PM Normal stress test  Left/Right Heart Catheterizations: Date: 03/28/2020 Results:  Prox Cx lesion is 30% stenosed.  Prox LAD to Mid LAD lesion is 60% stenosed.  Dist LAD lesion is 60% stenosed.   1. The LAD is a large vessel that courses to the apex. Moderate, heavily calcified proximal to mid stenosis that does is eccentric and does not appear to flow limiting. Focal mid to distal moderate stenosis.  2. Moderate disease in the intermediate branch 3. Mild plaque in the Circumflex 4. Large, dominant RCA with no obstructive disease 5. Low flow/low gradient moderate to severe aortic stenosis (Cath data: Mean gradient 16.3mmHg, peak to peak gradient 16 mmHg). By echo his dimensionless index is 0.28 and SVI is 40.   Recommendations: He appears to have low flow/low gradient moderate to severe aortic stenosis. He has moderate CAD but in the absence of angina, would not treat the calcific disease in the proximal and mid LAD. He is asymptomatic at this time.    Recent Labs: 02/23/2020: ALT 22; TSH 0.757 03/19/2020: BUN 26; Creatinine, Ser 0.93; Magnesium 2.4; NT-Pro BNP 1,910; Platelets 267 03/28/2020: Hemoglobin 15.0; Potassium 4.2; Sodium 141  Recent Lipid Panel    Component Value Date/Time   CHOL 204 (H) 02/21/2020 0253   TRIG 199 (H) 02/21/2020 0253   HDL 42 02/21/2020 0253   CHOLHDL 4.9 02/21/2020 0253   VLDL 40 02/21/2020 0253   LDLCALC 122 (H) 02/21/2020 0253    Risk Assessment/Calculations:     N/A  Physical Exam:    VS:  BP (!) 90/50   Pulse 84   Ht 5\' 6"  (1.676 m)   Wt 186 lb 6.4 oz (84.6 kg)   SpO2 94%   BMI 30.09 kg/m     Wt Readings  from Last 3 Encounters:  04/15/20 186 lb 6.4 oz (84.6 kg)  04/04/20 184 lb (83.5 kg)  03/28/20 175 lb (79.4 kg)    GEN: Well nourished, well developed in  no acute distress HEENT: Normal NECK: No JVD; No carotid bruits LYMPHATICS: No lymphadenopathy CARDIAC: RRR, III/VI Systolic crescendo murmur without rubs or gallops, late S2, +1 radial pulse RESPIRATORY:  Clear to auscultation without rales, wheezing or rhonchi  ABDOMEN: Soft, non-tender, non-distended MUSCULOSKELETAL:  No edema; No deformity  SKIN: Warm and dry NEUROLOGIC:  Alert and oriented x 3 PSYCHIATRIC:  Normal affect   ASSESSMENT:    1. Preop cardiovascular exam   2. Frequent PVCs   3. HFrEF (heart failure with reduced ejection fraction) ([place])   4. SVT (supraventricular tachycardia) ([place])   5. Coronary artery disease involving native coronary artery of native heart without angina pectoris   6. Nonrheumatic aortic valve stenosis   7. Mixed hyperlipidemia   8. Tobacco abuse    PLAN:    In order of problems listed above:  Preoperative Risk Assessment - The Revised Cardiac Risk Index = >2 =>11%; CAD, HF, (Frequent PVCs likely would increase risks) high risk estimated risk of perioperative myocardial infarction, pulmonary edema, ventricular fibrillation, cardiac arrest, or complete heart block.  - Greater than 4 functional mets - recent LHC reveals CAD but patient is asymptomatic; no PCI recommended - The patient may proceed to surgery at acceptable risk.   - Discussed the cardiac risk and benefits of surgery and patient and wife are amenable to proceed - Our service is available as needed in the peri-operative period.    Heart Failure Reduced Ejection Fraction  Low Flow Low Gradient AS - NYHA class I, Stage B, euvolemic, etiology likely multifocal; CAD may be a component, also had frequent PVCs - Diuretic regimen: lasix 20 mg PO daily - Strict I/Os, daily weights, and fluid restriction of < 2 L  - BMP/BNP/Mg  -  continue 12.5 mg metoprolol XL - Continue aldactone 12.5 mg PO Daily - losartan stopped for hypotension - post surgery will get structural heart eval  SVT Frequent PVCs NSVT - AV Nodal Therapy: BB as above - would recommend CMR for etiology  Coronary Artery Disease; Nonobstructive - asymptomatic - anatomy: Prox LAD 60%, mid LAD 60^, prox Cir 30% - continue ASA 81 mg - continue statin, goal LDL < 70 (see below) - continue BB - continue ARB as above - discussed cardiac rehab  Hyperlipidemia (mixed) -multiple statin intolerances -LDL goal less than 100 -continue Zetia and pravastatin -Recheck lipid profile LFTs in post surgical evaluation - gave education on dietary changes  Tobacco Abuse-> down to two cigars a week - discussed the dangers of tobacco use, both inhaled and oral, which include, but are not limited to cardiovascular disease, increased cancer risk of multiple types of cancer, COPD, peripheral arterial disease, strokes. - counseled on the benefits of smoking cessation. - firmly advised to quit.  - we also reviewed strategies to maximize success, including:  Removing cigarettes and smoking materials from environment   Stress management  Substitution of other forms of reinforcement (the one cigarette a day approach)  Support of family/friends and group smoking cessation  Selecting a quit date  Patient provided contact information for [employer] or 1-800-QUIT-NOW  Patient provided with 's 8 free smoking cessation classes: [phone] and CarWashShow.fr    2-3 months follow up unless new symptoms or abnormal test results warranting change in plan  Would be reasonable for  APP Follow up/  Medication Adjustments/Labs and Tests Ordered: Current medicines are reviewed at length with the patient today.  Concerns regarding medicines are outlined above.  No orders of the  defined types were placed in this encounter.  No orders of the  defined types were placed in this encounter.   Patient Instructions  Medication Instructions:   Discontinue Losartan   *If you need a refill on your cardiac medications before your next appointment, please call your pharmacy*   Lab Work: None ordered   If you have labs (blood work) drawn today and your tests are completely normal, you will receive your results only by: Marland Kitchen MyChart Message (if you have MyChart) OR . A paper copy in the mail If you have any lab test that is abnormal or we need to change your treatment, we will call you to review the results.   Testing/Procedures: Your physician has ordered for you to have a Cardiac MRI at [facility], we have sent a one time prescription of Xanax 0.5 mg for you to use 1/2 prior to your MRI    Follow-Up: At [facility], you and your health needs are our priority.  As part of our continuing mission to provide you with exceptional heart care, we have created designated Provider Care Teams.  These Care Teams include your primary Cardiologist (physician) and Advanced Practice Providers (APPs -  Physician Assistants and Nurse Practitioners) who all work together to provide you with the care you need, when you need it.  We recommend signing up for the patient portal called "MyChart".  Sign up information is provided on this After Visit Summary.  MyChart is used to connect with patients for Virtual Visits (Telemedicine).  Patients are able to view lab/test results, encounter notes, upcoming appointments, etc.  Non-urgent messages can be sent to your provider as well.   To learn more about what you can do with MyChart, go to [email or web address].    Your next appointment:   3 month(s)  The format for your next appointment:   In Person  Provider:   You may see Dr. Rudean Haskell or one of the following Advanced Practice Providers on your designated Care Team:    Melina Copa, PA-C  Ermalinda Barrios, PA-C    Other  Instructions None       Signed, Werner Lean, MD  04/15/2020 3:44 PM    [place]

## 2020-04-18 ENCOUNTER — Telehealth: Payer: Self-pay | Admitting: Internal Medicine

## 2020-04-18 NOTE — Telephone Encounter (Signed)
Spoke with patient regarding appointment for Cardiac MRI scheduled 10/28/20 at 8:00 am at [facility]---arrival time is 7:30 am--1st floor admissions office.  Will mail information to patient and it is also in My Chart---Ruben Barrera voiced her understanding.

## 2020-04-25 NOTE — Telephone Encounter (Signed)
   Primary Cardiologist: Ena Dawley, MD  Chart reviewed as part of pre-operative protocol coverage. Patient saw Dr. Gasper Sells 04/15/20, at which time preoperative status was addressed and patient was deemed an acceptable risk for surgery without further cardiac work-up.   I will route this recommendation to the requesting party via Epic fax function and remove from pre-op pool.  Please call with questions.  Callback: - Please confirm the requesting office receives the fax. Thank you!  Abigail Butts, PA-C 04/25/2020, 4:29 PM

## 2020-04-25 NOTE — Telephone Encounter (Signed)
Pt called in and stated that [facility] never rec'd the this clearance.  He as if we could try calling them again to provide them this info    Best number [phone] Ext [phone]

## 2020-04-29 ENCOUNTER — Telehealth (HOSPITAL_COMMUNITY): Payer: Self-pay | Admitting: Emergency Medicine

## 2020-04-29 NOTE — Telephone Encounter (Signed)
Reaching out to patient to offer assistance regarding upcoming cardiac imaging study; pt verbalizes understanding of appt date/time, parking situation and where to check in, pre-test NPO status and medications ordered, and verified current allergies; name and call back number provided for further questions should they arise Marchia Bond RN Navigator Cardiac Imaging [facility] [phone] office [phone] cell   Pt reports CLAUSTRO, denies implants (preparing for spinal stimulator but does not have one yet) - pt taking xanax prior to appt Clarise Cruz

## 2020-04-30 ENCOUNTER — Other Ambulatory Visit: Payer: Self-pay

## 2020-04-30 ENCOUNTER — Ambulatory Visit (HOSPITAL_COMMUNITY)
Admission: RE | Admit: 2020-04-30 | Discharge: 2020-04-30 | Disposition: A | Discharge status: Home / Self Care | Payer: PPO | Source: Ambulatory Visit | Attending: Internal Medicine | Admitting: Internal Medicine

## 2020-04-30 DIAGNOSIS — I493 Ventricular premature depolarization: Secondary | ICD-10-CM | POA: Insufficient documentation

## 2020-04-30 MED ORDER — GADOBUTROL 1 MMOL/ML IV SOLN
8.0000 mL | Freq: Once | INTRAVENOUS | Status: AC | PRN
  Administered 2020-04-30: 8 mL via INTRAVENOUS

## 2020-05-01 DIAGNOSIS — L570 Actinic keratosis: Secondary | ICD-10-CM | POA: Diagnosis not present

## 2020-05-01 DIAGNOSIS — L821 Other seborrheic keratosis: Secondary | ICD-10-CM | POA: Diagnosis not present

## 2020-05-01 DIAGNOSIS — L814 Other melanin hyperpigmentation: Secondary | ICD-10-CM | POA: Diagnosis not present

## 2020-05-01 DIAGNOSIS — D225 Melanocytic nevi of trunk: Secondary | ICD-10-CM | POA: Diagnosis not present

## 2020-05-02 ENCOUNTER — Telehealth: Payer: Self-pay | Admitting: *Deleted

## 2020-05-02 DIAGNOSIS — I471 Supraventricular tachycardia: Secondary | ICD-10-CM

## 2020-05-02 DIAGNOSIS — I5020 Unspecified systolic (congestive) heart failure: Secondary | ICD-10-CM

## 2020-05-02 NOTE — Telephone Encounter (Signed)
Pt has been notified of MR Card results and recommendation by phone with verbal understanding. Pt is agreeable to see EP. I will place referral to EP. Pt aware Doylene Canning will call him and schedule him an appt with one of our EP providers. Pt thanked me for the call and the help. Patient notified of result.  Please refer to phone note from today for complete details.   Julaine Hua, [place] 05/02/2020 11:10 AM

## 2020-05-02 NOTE — Telephone Encounter (Signed)
-----   Message from Nuala Alpha, LPN sent at 04/25/7626  9:51 AM EST -----  ----- Message ----- From: Werner Lean, MD Sent: 05/01/2020   9:47 AM EST To: [facility] Triage  Results: No evidence of scar Plan: Will send to EP for evaluation; no change in his risk for surgery  Werner Lean, MD

## 2020-05-03 ENCOUNTER — Telehealth: Payer: Self-pay | Admitting: Internal Medicine

## 2020-05-03 NOTE — Telephone Encounter (Signed)
Patient wanted to make sure that the doctor was aware he had started back on the full tablet of metoprolol.  Been taking the whole tablet now for some time.   He is feeling well.   See telephone notes 03/27/20 for complete details - hehas not been taking any at all for some time so was adv to start back on 1/2 tab.   udpated medication list.

## 2020-05-03 NOTE — Telephone Encounter (Signed)
Pt has questions about two different medications    1. Name of Medication: senna-docusate (SENOKOT-S) 8.6-50 MG tablet  2. How are you currently taking this medication (dosage and times per day)? Patient is not taking  3. Are you having a reaction (difficulty breathing--STAT)?   4. What is your medication issue? Patient says this medication is on his MyChart list but he has never taken it. He is not sure if he needs to take it or not     1. Name of Medication: metoprolol succinate (TOPROL-XL) 25 MG 24 hr tablet  2. How are you currently taking this medication (dosage and times per day)? 1 tablet daily  3. Are you having a reaction (difficulty breathing--STAT)?   4. What is your medication issue? Patient said he looked on his mychart and the directions were to take 1/2 tablet but the bottle says to take 1 full tablet. He just wants to make sure he is doing it right

## 2020-05-07 ENCOUNTER — Ambulatory Visit: Payer: PPO | Attending: Physician Assistant | Admitting: Occupational Therapy

## 2020-05-07 ENCOUNTER — Other Ambulatory Visit: Payer: Self-pay

## 2020-05-07 ENCOUNTER — Ambulatory Visit: Payer: PPO | Admitting: Physical Therapy

## 2020-05-07 DIAGNOSIS — R262 Difficulty in walking, not elsewhere classified: Secondary | ICD-10-CM | POA: Diagnosis not present

## 2020-05-07 DIAGNOSIS — R41840 Attention and concentration deficit: Secondary | ICD-10-CM | POA: Diagnosis not present

## 2020-05-07 DIAGNOSIS — R2681 Unsteadiness on feet: Secondary | ICD-10-CM | POA: Diagnosis not present

## 2020-05-07 DIAGNOSIS — R293 Abnormal posture: Secondary | ICD-10-CM | POA: Insufficient documentation

## 2020-05-07 DIAGNOSIS — R41844 Frontal lobe and executive function deficit: Secondary | ICD-10-CM | POA: Insufficient documentation

## 2020-05-07 DIAGNOSIS — M6281 Muscle weakness (generalized): Secondary | ICD-10-CM | POA: Diagnosis not present

## 2020-05-07 DIAGNOSIS — R2689 Other abnormalities of gait and mobility: Secondary | ICD-10-CM | POA: Diagnosis not present

## 2020-05-07 NOTE — Therapy (Signed)
[facility] [address] [place], [place], [address]   Phone: [phone]   Fax:  [phone] OCCUPATIONAL THERAPY DISCHARGE SUMMARY    Current functional level related to goals / functional outcomes: Pt achieved some goals, but he did not fully achieve all goals as he was not seen to frequency and requested early d/c. Pt was not accompanied by his wife his last several visits.   Remaining deficits: cognitive deficits including poor awareness, decreased alternating attention and impulsivity, pain, decreased strength decreased balance   Education / Equipment: Pt was educated regarding initial HEP, and graded driving program. Therapist expressed concerns regarding pt return to driving due to cognitive deficits.  (Pt does not agree and demonstrates decreased insight into deficits). Plan: Patient agrees to discharge.  Patient goals were partially met. Patient is being discharged due to the patient's request.  ?????      Occupational Therapy Treatment  Patient Details  Name: Ruben Barrera MRN: [id number] Date of Birth: 1933/03/23 Referring Provider (OT): Dr. Swartz/ Dr. Ranell Patrick   Encounter Date: 05/07/2020   OT End of Session - 05/07/20 1303    Visit Number 6    Number of Visits 25   will likely d/c after 8 weeks depending on progress.   Date for OT Re-Evaluation 05/30/20    Authorization Type [employer]    Authorization Time Period pt only scheduled for 2x week x 4 weeks, once ST order obtained- add an additional 2x week x 4 weeks for PT/OT as well as ST visits    Authorization - Visit Number 6    Authorization - Number of Visits 10    Progress Note Due on Visit 10    OT Start Time 8916    OT Stop Time 1310    OT Time Calculation (min) 25 min    Activity Tolerance Treatment limited secondary to agitation    Behavior During Therapy Impulsive;Restless           Past Medical History:  Diagnosis Date   . Arthritis   . Complication of anesthesia    "had the shivers" after surgery  . Depression   . Dysrhythmia    "extra heart beat"  . GERD (gastroesophageal reflux disease)   . History of hiatal hernia   . Hypercholesteremia   . Hypertension   . Lumbar spinal stenosis   . RLS (restless legs syndrome)    takes ativan as needed    Past Surgical History:  Procedure Laterality Date  . BACK SURGERY    . LUMBAR LAMINECTOMY/DECOMPRESSION MICRODISCECTOMY N/A 08/28/2015   Procedure: Lumbar Four-Five decompressive lumbar laminectomy;  Surgeon: Jovita Gamma, MD;  Location: [facility];  Service: Neurosurgery;  Laterality: N/A;  . REPLACEMENT TOTAL KNEE BILATERAL Bilateral 11/05/2003  . RIGHT/LEFT HEART CATH AND CORONARY ANGIOGRAPHY N/A 03/28/2020   Procedure: RIGHT/LEFT HEART CATH AND CORONARY ANGIOGRAPHY;  Surgeon: Burnell Blanks, MD;  Location: [facility];  Service: Cardiovascular;  Laterality: N/A;  . TOE AMPUTATION Right 2010   second toe  . TONSILLECTOMY      There were no vitals filed for this visit.   Subjective Assessment - 05/07/20 1246    Subjective  Back pain is really killing me today    Currently in Pain? Yes    Pain Score 9     Pain Location Back    Pain Orientation Right;Left    Pain Descriptors / Indicators Sharp    Pain Type Chronic pain  Pain Onset More than a month ago    Pain Frequency Intermittent    Aggravating Factors  walking    Pain Relieving Factors heat                    Treatment: Therapist checked progress towards goals as pt requests d/c.(Pt was confused about which appointment he has today, he though he had ST. Pt decided not to pursue ST). Pt remains impulsive and demonstrates decreased awareness. Pt was unaccompanied today.            OT Education - 05/08/20 1253    Education Details Progress towards goals recommendation that pt continues OT due to cognitve deficits, pt declined    Person(s) Educated  Patient    Methods Explanation    Comprehension Verbalized understanding            OT Short Term Goals - 05/07/20 1247      OT SHORT TERM GOAL #1   Title I with inital HEP.    Status Achieved      OT SHORT TERM GOAL #2   Title Pt will increase bilateral grip strength by 5 lbs for increased ease with ADLs.    Status Achieved      OT SHORT TERM GOAL #3   Title Pt will write a sentence with 90% legibility    Status Achieved      OT SHORT TERM GOAL #4   Title Pt will demonstrate ability to sequence a functional task/ home management task demonstrating good safety awareness, with no more than min v.c    Status Achieved    Pt reports performing light home management modified independently     OT SHORT TERM GOAL #5   Title Pt will ambulate while perfoming a physical/ cognitive task, with 80% accuracy and no LOB.    Status Achieved   achieved for simple cognitive task while ambulating            OT Long Term Goals - 05/07/20 1249      OT LONG TERM GOAL #1   Title I with updated HEP for right UE proximal strength    Time 12    Period Weeks    Status Deferred   deferred due to back pain     OT LONG TERM GOAL #2   Title Pt will increase standing functional reach to 10 inches or greater to minimize fall risk for ADLS.    Baseline R 8 inches, L 9 inches    Time 12    Period Weeks    Status Met 12 inches bilaterally     OT LONG TERM GOAL #3   Title Pt will write a short paragraph with 100% legibility.    Baseline 75% at sentence level    Time 12    Period Weeks    Status Achieved      OT LONG TERM GOAL #4   Title Pt will demonstrate ability to perform a physical and cognive task simultaneously with 90% or better accuracy and no LOB.    Time 12    Period Weeks    Status Not Met   not consistent     OT LONG TERM GOAL #5   Title Pt will sequence a functional task/ home managment task and complete modified independently.    Baseline slowed processing, difficulty  following instructions decreased attention.    Time 12    Status Partially met   Pt reports he is washing  dishes, and light cleaning mod I     OT LONG TERM GOAL #6   Title Pt / wife will verbalize understanding of return to driving recommendations.    Time 12    Period Weeks    Status Achieved   Pt was educated regarding graded return to driving since cleared by MD, therapist has expressed  concerns regarding pt ability to drive safely                Plan - 05/08/20 1254    Clinical Impression Statement Pt arrived today stating that this would be his last OT visit. Despite therapist recommendation that pt continues, pt declined. He was unaccompanied today.    Occupational performance deficits (Please refer to evaluation for details): ADL's;IADL's;Work;Leisure;Play;Social Participation    Body Structure / Function / Physical Skills ADL;Balance;UE functional use;ROM;Gait;GMC;Decreased knowledge of precautions;Coordination;IADL;Decreased knowledge of use of DME;Strength;Dexterity;Mobility    Cognitive Skills Attention;Memory;Problem Solve;Safety Awareness;Sequencing;Temperament/Personality;Thought;Understand    Rehab Potential Good    OT Frequency 2x / week    OT Duration 12 weeks    OT Treatment/Interventions Self-care/ADL training;Ultrasound;Energy conservation;Patient/family education;DME and/or AE instruction;Aquatic Therapy;Paraffin;Gait Training;Passive range of motion;Balance training;Fluidtherapy;Cryotherapy;Electrical Stimulation;Contrast Bath;Functional Mobility Training;Therapeutic activities;Manual Therapy;Therapeutic exercise;Moist Heat;Neuromuscular education;Cognitive remediation/compensation    Plan d/c OT per pt request    Consulted and Agree with Plan of Care Patient           Patient will benefit from skilled therapeutic intervention in order to improve the following deficits and impairments:   Body Structure / Function / Physical Skills: ADL,Balance,UE functional  use,ROM,Gait,GMC,Decreased knowledge of precautions,Coordination,IADL,Decreased knowledge of use of DME,Strength,Dexterity,Mobility Cognitive Skills: Attention,Memory,Problem Solve,Safety Awareness,Sequencing,Temperament/Personality,Thought,Understand     Visit Diagnosis: Attention and concentration deficit  Muscle weakness (generalized)  Other abnormalities of gait and mobility  Abnormal posture  Frontal lobe and executive function deficit  Difficulty in walking, not elsewhere classified  Unsteadiness on feet      Problem List Patient Active Problem List   Diagnosis Date Noted  . Frequent PVCs 04/15/2020  . Preop cardiovascular exam 04/15/2020  . HFrEF (heart failure with reduced ejection fraction) ([place]) 04/15/2020  . SVT (supraventricular tachycardia) ([facility]) 04/15/2020  . Coronary artery disease involving native coronary artery of native heart without angina pectoris 04/15/2020  . Nonrheumatic aortic valve stenosis 03/19/2020  . Dyslipidemia   . Chronic systolic congestive heart failure ([place])   . Acute ischemic cerebrovascular accident (CVA) involving anterior cerebral artery territory ([place]) 02/22/2020  . Mixed hyperlipidemia   . Tobacco abuse   . Depression   . Acute stroke due to occlusion of right cerebellar artery ([facility]) 02/20/2020  . Erythrocytosis 02/20/2020  . BPH (benign prostatic hyperplasia) 02/20/2020  . HNP (herniated nucleus pulposus), lumbar 01/13/2016  . Lumbar stenosis with neurogenic claudication 08/28/2015  . DOE (dyspnea on exertion) 05/01/2014  . Essential hypertension 05/01/2014  . Left shoulder pain 05/05/2011    Yehudah Standing 05/08/2020, 12:59 PM Theone Murdoch, OTR/L [phone] Phone: [phone] 12:59 PM 05/08/20 [facility] [address] [place], [place], [address] Phone: [phone]   Fax:  [phone]  Name: JOLLY BLEICHER MRN: [id number] Date of Birth:  [birth year]

## 2020-05-08 ENCOUNTER — Encounter: Payer: Self-pay | Admitting: Physical Medicine and Rehabilitation

## 2020-05-08 ENCOUNTER — Encounter: Payer: PPO | Attending: Physical Medicine and Rehabilitation | Admitting: Physical Medicine and Rehabilitation

## 2020-05-08 ENCOUNTER — Other Ambulatory Visit: Payer: Self-pay

## 2020-05-08 VITALS — BP 158/94 | HR 64 | Temp 98.4°F | Ht 66.0 in | Wt 183.0 lb

## 2020-05-08 DIAGNOSIS — M48062 Spinal stenosis, lumbar region with neurogenic claudication: Secondary | ICD-10-CM | POA: Insufficient documentation

## 2020-05-08 DIAGNOSIS — I63541 Cerebral infarction due to unspecified occlusion or stenosis of right cerebellar artery: Secondary | ICD-10-CM | POA: Diagnosis not present

## 2020-05-08 NOTE — Progress Notes (Signed)
Subjective:    Patient ID: Ruben Barrera, male    DOB: 02-15-1933, 87 y.o.   MRN: [id number]  HPI  Ruben Barrera is a an 88 year old man who presents for follow-up of since CIR admission for right cerebellar artery stroke, with associated cognitive deficits.   1) Driving:  -He has returned to driving without issues.  2) Impaired mobility and ADLs:  -He has been doing well at home. He has been doing outpatient therapy.  -He has been walking without any help. -PT told him to pause his sessions until he has his spinal cord stimulator placed.   3) Lower back pain secondary to spinal stenosis.  -He has SCS scheduled next week. Discussed risks and benefits of procedure.  -He does have average pain of 7/10 in his lower back. He has had ESIs without benefit.  -He did not get benefit or side effects from Cymbalta.  -His pain is worse with movement.  -He has not required much Baclofen, but did use it when he had a bad episode of spasm.   Hs wife accompanies him today.    Pain Inventory Average Pain 7 Pain Right Now 7 My pain is aching  LOCATION OF PAIN  Back, Buttocks, groin, hip, thigh, knee, leg,  BOWEL Number of stools per week: 6 Oral laxative use Yes  Type of laxative senna Enema or suppository use No  History of colostomy No  Incontinent No   BLADDER Normal In and out cath, frequency n/a Able to self cath n/a Bladder incontinence No  Frequent urination No  Leakage with coughing No  Difficulty starting stream No  Incomplete bladder emptying No    Mobility walk without assistance walk with assistance use a cane use a walker how many minutes can you walk? 5 ability to climb steps?  yes do you drive?  yes Do you have any goals in this area?  yes  Function retired  Neuro/Psych numbness trouble walking spasms  Prior Studies Any changes since last visit?  no  Physicians involved in your care Any changes since last visit?  no   Family History  Problem  Relation Age of Onset  . Stroke Mother   . Hypertension Other        family history   Social History   Socioeconomic History  . Marital status: Married    Spouse name: Not on file  . Number of children: Not on file  . Years of education: Not on file  . Highest education level: Not on file  Occupational History  . Not on file  Tobacco Use  . Smoking status: Current Every Day Smoker    Types: Cigars  . Smokeless tobacco: Never Used  . Tobacco comment: quit 1972  Vaping Use  . Vaping Use: Never used  Substance and Sexual Activity  . Alcohol use: Yes    Alcohol/week: 0.0 standard drinks    Comment: occasionally beer or scotch  . Drug use: No    Comment: 1 cigar per day, pt states he does not inhale   . Sexual activity: Not Currently  Other Topics Concern  . Not on file  Social History Narrative   Right handed   Lives with wife one story home   Social Determinants of Health   Financial Resource Strain: Not on file  Food Insecurity: Not on file  Transportation Needs: Not on file  Physical Activity: Not on file  Stress: Not on file  Social Connections: Not on file  Past Surgical History:  Procedure Laterality Date  . BACK SURGERY    . LUMBAR LAMINECTOMY/DECOMPRESSION MICRODISCECTOMY N/A 08/28/2015   Procedure: Lumbar Four-Five decompressive lumbar laminectomy;  Surgeon: Jovita Gamma, MD;  Location: [facility];  Service: Neurosurgery;  Laterality: N/A;  . REPLACEMENT TOTAL KNEE BILATERAL Bilateral 11/05/2003  . RIGHT/LEFT HEART CATH AND CORONARY ANGIOGRAPHY N/A 03/28/2020   Procedure: RIGHT/LEFT HEART CATH AND CORONARY ANGIOGRAPHY;  Surgeon: Burnell Blanks, MD;  Location: [facility];  Service: Cardiovascular;  Laterality: N/A;  . TOE AMPUTATION Right 2010   second toe  . TONSILLECTOMY     Past Medical History:  Diagnosis Date  . Arthritis   . Complication of anesthesia    "had the shivers" after surgery  . Depression   . Dysrhythmia    "extra  heart beat"  . GERD (gastroesophageal reflux disease)   . History of hiatal hernia   . Hypercholesteremia   . Hypertension   . Lumbar spinal stenosis   . RLS (restless legs syndrome)    takes ativan as needed   BP (!) 158/94   Pulse 64   Temp 98.4 F (36.9 C)   Ht 5\' 6"  (1.676 m)   Wt 183 lb (83 kg)   SpO2 92%   BMI 29.54 kg/m   Opioid Risk Score:   Fall Risk Score:  `1  Depression screen PHQ 2/9  Depression screen PHQ 2/9 03/05/2020  Decreased Interest 0  Down, Depressed, Hopeless 0  PHQ - 2 Score 0  Altered sleeping 0  Tired, decreased energy 0  Change in appetite 0  Feeling bad or failure about yourself  0  Trouble concentrating 0  Moving slowly or fidgety/restless 1  Suicidal thoughts 0  PHQ-9 Score 1    Review of Systems  Constitutional: Negative.   HENT: Negative.   Eyes: Negative.   Respiratory: Negative.   Cardiovascular: Negative.   Gastrointestinal: Negative.   Endocrine: Negative.   Genitourinary: Negative.   Musculoskeletal: Positive for arthralgias, back pain and gait problem.       Spasms   Skin: Negative.   Allergic/Immunologic: Negative.   Neurological: Positive for numbness.  Hematological: Negative.   Psychiatric/Behavioral: Negative.   All other systems reviewed and are negative.      Objective:   Physical Exam Gen: no distress, normal appearing HEENT: oral mucosa pink and moist, NCAT Cardio: Reg rate Chest: normal effort, normal rate of breathing Abd: soft, non-distended Ext: no edema Psych: pleasant, normal affect Skin: intact Neuro: Alert and oriented x3 Musculoskeletal: 4/5 strength right leg. + slump test right side. Flexion and extension limited 25%.  Psych: pleasant, normal affect    Assessment & Plan:  Ruben Barrera is an 87 year old man who presents for hospital follow-up after CIR admission for ACA stroke  1) s/p ACA stroke -doing well at home.  -restart outpatient therapies after spinal cord stimulator  placement.  -Advised to increase walking distance to improve strength and balance -Continue biking three times per week to improve strength. -Postpone spinal cord stimulator until 3 months post-stroke.  RETURN TO DRIVING PLAN:  WITH THE SUPERVISION OF A LICENSED DRIVER, PLEASE DRIVE IN AN EMPTY PARKING LOT FOR AT LEAST 2-3 TRIALS TO TEST REACTION TIME, VISION, USE OF EQUIPMENT IN CAR, ETC.  IF SUCCESSFUL WITH THE PARKING LOT DRIVING, PROCEED TO SUPERVISED DRIVING TRIALS IN YOUR NEIGHBORHOOD STREETS AT LOW TRAFFIC TIMES TO TEST OBSERVATION TO TRAFFIC SIGNALS, REACTION TIME, ETC. PLEASE ATTEMPT AT LEAST 2-3  TRIALS IN YOUR NEIGHBORHOOD.  IF NEIGHBORHOOD DRIVING IS SUCCESSFUL, YOU MAY PROCEED TO DRIVING IN BUSIER AREAS IN YOUR COMMUNITY WITH SUPERVISION OF A LICENSED DRIVER. PLEASE ATTEMPT AT LEAST 4-5 TRIALS.  IF COMMUNITY DRIVING IS SUCCESSFUL, YOU MAY PROCEED TO DRIVING ALONE, DURING THE DAY TIME.  2) Chronic Pain Syndrome secondary to lumbar stenosis with neurogenic claudication -Discussed current symptoms of pain and history of pain.  -Discussed benefits of exercise in reducing pain. -Discontinue Cymbalta since it is not helping.  -Plan for spinal cord stimulator next week, discussed risks and benefits. -Continue Baclofen PRN for muscle spasm.  -Discussed following foods that may reduce pain: 1) Ginger 2) Blueberries 3) Salmon 4) Pumpkin seeds 5) dark chocolate 6) turmeric 7) tart cherries 8) virgin olive oil 9) chilli peppers 10) mint  Turmeric to reduce inflammation-can be used in cooking or taken as a supplement.  Benefits of turmeric:  -Highly anti-inflammatory  -Increases antioxidants  -Improves memory, attention, brain disease  -Lowers risk of heart disease  -May help prevent cancer  -Decreases pain  -Alleviates depression  -Delays aging and decreases risk of chronic disease  -Consume with black pepper to increase absorption    Turmeric Milk Recipe:  1  cup milk  1 tsp turmeric  1 tsp cinnamon  1 tsp grated ginger (optional)  Black pepper (boosts the anti-inflammatory properties of turmeric).  1 tsp honey

## 2020-05-10 ENCOUNTER — Ambulatory Visit: Payer: PPO

## 2020-05-10 ENCOUNTER — Ambulatory Visit: Payer: PPO | Admitting: Physical Therapy

## 2020-05-13 DIAGNOSIS — M5416 Radiculopathy, lumbar region: Secondary | ICD-10-CM | POA: Diagnosis not present

## 2020-05-13 DIAGNOSIS — G894 Chronic pain syndrome: Secondary | ICD-10-CM | POA: Diagnosis not present

## 2020-05-13 DIAGNOSIS — M961 Postlaminectomy syndrome, not elsewhere classified: Secondary | ICD-10-CM | POA: Diagnosis not present

## 2020-05-14 ENCOUNTER — Encounter: Payer: Self-pay | Admitting: Internal Medicine

## 2020-05-14 ENCOUNTER — Ambulatory Visit: Payer: PPO | Admitting: Occupational Therapy

## 2020-05-14 ENCOUNTER — Ambulatory Visit: Payer: PPO | Admitting: Internal Medicine

## 2020-05-14 ENCOUNTER — Other Ambulatory Visit: Payer: Self-pay

## 2020-05-14 ENCOUNTER — Ambulatory Visit: Payer: PPO | Admitting: Physical Therapy

## 2020-05-14 VITALS — BP 112/68 | HR 78 | Ht 66.0 in | Wt 186.2 lb

## 2020-05-14 DIAGNOSIS — I5022 Chronic systolic (congestive) heart failure: Secondary | ICD-10-CM

## 2020-05-14 DIAGNOSIS — I493 Ventricular premature depolarization: Secondary | ICD-10-CM | POA: Diagnosis not present

## 2020-05-14 DIAGNOSIS — I471 Supraventricular tachycardia: Secondary | ICD-10-CM

## 2020-05-14 NOTE — Patient Instructions (Addendum)
Medication Instructions:  Your physician recommends that you continue on your current medications as directed. Please refer to the Current Medication list given to you today.  Labwork: None ordered.  Testing/Procedures: None ordered.  Follow-Up: Your physician wants you to follow-up in: as needed with Dr. Taylor.      Any Other Special Instructions Will Be Listed Below (If Applicable).  If you need a refill on your cardiac medications before your next appointment, please call your pharmacy.   

## 2020-05-14 NOTE — Progress Notes (Signed)
HPI Ruben Barrera is referred by Dr.  Gasper Sells for eval of NSVT and LV dysfunction. He has a h/o PVC's and NS SVT and NS VT. He has never had syncope. He remains active despite AS and LV dysfunction with his exercise limitation due to his spinal stenosis. He has never had syncope. He has non-obstructive CAD. He denies edema.  Allergies  Allergen Reactions  . Simvastatin Other (See Comments)    Leg pains   . Other Other (See Comments)    UNSPECIFIED REACTION  Reaction to anesthesia per patient     Current Outpatient Medications  Medication Sig Dispense Refill  . acetaminophen (TYLENOL) 325 MG tablet Take 2 tablets (650 mg total) by mouth every 4 (four) hours as needed for mild pain (or temp > 37.5 C (99.5 F)).    Marland Kitchen aspirin EC 81 MG EC tablet Take 1 tablet (81 mg total) by mouth daily. Swallow whole. 30 tablet 11  . Baclofen 5 MG TABS Take 5 mg by mouth 3 (three) times daily as needed. 90 tablet 0  . celecoxib (CELEBREX) 200 MG capsule Take 200 mg by mouth daily.    . citalopram (CELEXA) 10 MG tablet Take 1 tablet (10 mg total) by mouth daily. 30 tablet 0  . ezetimibe (ZETIA) 10 MG tablet Take 1 tablet (10 mg total) by mouth every evening. 30 tablet 0  . fluticasone (FLONASE) 50 MCG/ACT nasal spray Place 1 spray into both nostrils daily as needed for allergies.     . furosemide (LASIX) 20 MG tablet Take 1 tablet (20 mg total) by mouth daily. 90 tablet 3  . gabapentin (NEURONTIN) 400 MG capsule Take 400 mg by mouth daily.    Marland Kitchen gabapentin (NEURONTIN) 600 MG tablet Take 1 tablet (600 mg total) by mouth 3 (three) times daily. 90 tablet 0  . HYDROcodone-acetaminophen (NORCO) 7.5-325 MG tablet Take 1 tablet by mouth every 8 (eight) hours as needed for moderate pain. 20 tablet 0  . loratadine (CLARITIN) 10 MG tablet Take 10 mg by mouth daily in the afternoon.    Marland Kitchen LORazepam (ATIVAN) 1 MG tablet Take 1 tablet (1 mg total) by mouth every 8 (eight) hours as needed for anxiety. 10 tablet  0  . Magnesium 250 MG TABS Take 1 tablet by mouth daily in the afternoon.    . metoprolol succinate (TOPROL-XL) 25 MG 24 hr tablet Take 1 tablet by mouth daily.    . MULTIPLE VITAMIN PO Take 1 tablet by mouth daily.     Marland Kitchen omeprazole (PRILOSEC) 20 MG capsule Take 20 mg by mouth daily.    . pravastatin (PRAVACHOL) 20 MG tablet Take 1 tablet (20 mg total) by mouth daily at 6 PM. 30 tablet 0  . saccharomyces boulardii (FLORASTOR) 250 MG capsule Take 250 mg by mouth daily in the afternoon.    . senna-docusate (SENOKOT-S) 8.6-50 MG tablet Take 1 tablet by mouth 2 (two) times daily.    Marland Kitchen spironolactone (ALDACTONE) 25 MG tablet Take 0.5 tablets (12.5 mg total) by mouth daily. 45 tablet 3  . tamsulosin (FLOMAX) 0.4 MG CAPS capsule Take 1 capsule (0.4 mg total) by mouth daily. 30 capsule 0  . triamcinolone (KENALOG) 0.1 % Apply 1 application topically daily as needed (irritation).     No current facility-administered medications for this visit.     Past Medical History:  Diagnosis Date  . Arthritis   . Complication of anesthesia    "had the shivers" after  surgery  . Depression   . Dysrhythmia    "extra heart beat"  . GERD (gastroesophageal reflux disease)   . History of hiatal hernia   . Hypercholesteremia   . Hypertension   . Lumbar spinal stenosis   . RLS (restless legs syndrome)    takes ativan as needed    ROS:   All systems reviewed and negative except as noted in the HPI.   Past Surgical History:  Procedure Laterality Date  . BACK SURGERY    . LUMBAR LAMINECTOMY/DECOMPRESSION MICRODISCECTOMY N/A 08/28/2015   Procedure: Lumbar Four-Five decompressive lumbar laminectomy;  Surgeon: Jovita Gamma, MD;  Location: [facility];  Service: Neurosurgery;  Laterality: N/A;  . REPLACEMENT TOTAL KNEE BILATERAL Bilateral 11/05/2003  . RIGHT/LEFT HEART CATH AND CORONARY ANGIOGRAPHY N/A 03/28/2020   Procedure: RIGHT/LEFT HEART CATH AND CORONARY ANGIOGRAPHY;  Surgeon: Burnell Blanks,  MD;  Location: [facility];  Service: Cardiovascular;  Laterality: N/A;  . TOE AMPUTATION Right 2010   second toe  . TONSILLECTOMY       Family History  Problem Relation Age of Onset  . Stroke Mother   . Hypertension Other        family history     Social History   Socioeconomic History  . Marital status: Married    Spouse name: Not on file  . Number of children: Not on file  . Years of education: Not on file  . Highest education level: Not on file  Occupational History  . Not on file  Tobacco Use  . Smoking status: Current Every Day Smoker    Types: Cigars  . Smokeless tobacco: Never Used  . Tobacco comment: quit 1972  Vaping Use  . Vaping Use: Never used  Substance and Sexual Activity  . Alcohol use: Yes    Alcohol/week: 0.0 standard drinks    Comment: occasionally beer or scotch  . Drug use: No    Comment: 1 cigar per day, pt states he does not inhale   . Sexual activity: Not Currently  Other Topics Concern  . Not on file  Social History Narrative   Right handed   Lives with wife one story home   Social Determinants of Health   Financial Resource Strain: Not on file  Food Insecurity: Not on file  Transportation Needs: Not on file  Physical Activity: Not on file  Stress: Not on file  Social Connections: Not on file  Intimate Partner Violence: Not on file     BP 112/68   Pulse 78   Ht 5\' 6"  (1.676 m)   Wt 186 lb 3.2 oz (84.5 kg)   SpO2 96%   BMI 30.05 kg/m   Physical Exam:  Well appearing NAD HEENT: Unremarkable Neck:  No JVD, no thyromegally Lymphatics:  No adenopathy Back:  No CVA tenderness Lungs:  Clear with no wheezes HEART:  Regular rate rhythm, 2/6 AS murmur, no rubs, no clicks Abd:  soft, positive bowel sounds, no organomegally, no rebound, no guarding Ext:  2 plus pulses, no edema, no cyanosis, no clubbing Skin:  No rashes no nodules Neuro:  CN II through XII intact, motor grossly intact  EKG - reviewed.  NSR  Assess/Plan: 1. NSVT - he is asymptomatic and no treatment is indicated. Continue beta blocker 2. CAD - he has non-obstructive disease as per heart cath a month ago.  3. Chronic systolic heart failure - his symptoms are really class 1 despite LV dysfunction and AS. He is  on medical therapy. No indication for ICD insertion at this point. We typically do not recommend ICD for primary prevention in 85 yo patients with class 1 symptoms.  4. AS - he is pending TAVR work up. We will defer to his primary cardiologist.  Salome Spotted

## 2020-05-17 ENCOUNTER — Ambulatory Visit: Payer: PPO | Admitting: Physical Therapy

## 2020-05-20 ENCOUNTER — Other Ambulatory Visit (HOSPITAL_COMMUNITY): Payer: PPO

## 2020-05-20 DIAGNOSIS — Z9689 Presence of other specified functional implants: Secondary | ICD-10-CM | POA: Diagnosis not present

## 2020-05-20 DIAGNOSIS — G894 Chronic pain syndrome: Secondary | ICD-10-CM | POA: Diagnosis not present

## 2020-05-21 ENCOUNTER — Other Ambulatory Visit: Payer: Self-pay | Admitting: Neurosurgery

## 2020-05-21 ENCOUNTER — Ambulatory Visit: Payer: PPO | Admitting: Physical Therapy

## 2020-05-21 ENCOUNTER — Encounter: Payer: PPO | Admitting: Occupational Therapy

## 2020-05-21 DIAGNOSIS — G894 Chronic pain syndrome: Secondary | ICD-10-CM

## 2020-05-22 ENCOUNTER — Ambulatory Visit
Admission: RE | Admit: 2020-05-22 | Discharge: 2020-05-22 | Disposition: A | Discharge status: Home / Self Care | Payer: PPO | Source: Ambulatory Visit | Attending: Neurosurgery | Admitting: Neurosurgery

## 2020-05-22 ENCOUNTER — Other Ambulatory Visit: Payer: Self-pay

## 2020-05-22 DIAGNOSIS — R531 Weakness: Secondary | ICD-10-CM | POA: Diagnosis not present

## 2020-05-22 DIAGNOSIS — M2578 Osteophyte, vertebrae: Secondary | ICD-10-CM | POA: Diagnosis not present

## 2020-05-22 DIAGNOSIS — M4804 Spinal stenosis, thoracic region: Secondary | ICD-10-CM | POA: Diagnosis not present

## 2020-05-22 DIAGNOSIS — G894 Chronic pain syndrome: Secondary | ICD-10-CM

## 2020-05-22 DIAGNOSIS — R200 Anesthesia of skin: Secondary | ICD-10-CM | POA: Diagnosis not present

## 2020-05-24 ENCOUNTER — Encounter: Payer: PPO | Admitting: Occupational Therapy

## 2020-05-24 ENCOUNTER — Other Ambulatory Visit (HOSPITAL_COMMUNITY): Payer: PPO

## 2020-05-24 ENCOUNTER — Ambulatory Visit: Payer: PPO

## 2020-05-28 ENCOUNTER — Encounter: Payer: PPO | Admitting: Occupational Therapy

## 2020-05-28 ENCOUNTER — Ambulatory Visit: Payer: PPO | Admitting: Physical Therapy

## 2020-05-30 DIAGNOSIS — M48062 Spinal stenosis, lumbar region with neurogenic claudication: Secondary | ICD-10-CM | POA: Diagnosis not present

## 2020-05-30 DIAGNOSIS — Z6828 Body mass index (BMI) 28.0-28.9, adult: Secondary | ICD-10-CM | POA: Diagnosis not present

## 2020-05-30 DIAGNOSIS — I10 Essential (primary) hypertension: Secondary | ICD-10-CM | POA: Diagnosis not present

## 2020-05-31 ENCOUNTER — Encounter: Payer: PPO | Admitting: Occupational Therapy

## 2020-05-31 ENCOUNTER — Other Ambulatory Visit: Payer: Self-pay | Admitting: Neurological Surgery

## 2020-05-31 ENCOUNTER — Ambulatory Visit: Payer: PPO | Admitting: Physical Therapy

## 2020-05-31 DIAGNOSIS — M48062 Spinal stenosis, lumbar region with neurogenic claudication: Secondary | ICD-10-CM

## 2020-06-04 ENCOUNTER — Encounter: Payer: PPO | Admitting: Occupational Therapy

## 2020-06-06 ENCOUNTER — Ambulatory Visit
Admission: RE | Admit: 2020-06-06 | Discharge: 2020-06-06 | Disposition: A | Discharge status: Home / Self Care | Payer: PPO | Source: Ambulatory Visit | Attending: Neurological Surgery | Admitting: Neurological Surgery

## 2020-06-06 ENCOUNTER — Other Ambulatory Visit: Payer: Self-pay

## 2020-06-06 DIAGNOSIS — M545 Low back pain, unspecified: Secondary | ICD-10-CM | POA: Diagnosis not present

## 2020-06-06 DIAGNOSIS — M48061 Spinal stenosis, lumbar region without neurogenic claudication: Secondary | ICD-10-CM | POA: Diagnosis not present

## 2020-06-06 DIAGNOSIS — M48062 Spinal stenosis, lumbar region with neurogenic claudication: Secondary | ICD-10-CM

## 2020-06-07 ENCOUNTER — Encounter: Payer: PPO | Admitting: Occupational Therapy

## 2020-06-12 ENCOUNTER — Telehealth: Payer: Self-pay

## 2020-06-12 DIAGNOSIS — Z0000 Encounter for general adult medical examination without abnormal findings: Secondary | ICD-10-CM | POA: Diagnosis not present

## 2020-06-12 DIAGNOSIS — M48061 Spinal stenosis, lumbar region without neurogenic claudication: Secondary | ICD-10-CM | POA: Diagnosis not present

## 2020-06-12 DIAGNOSIS — G629 Polyneuropathy, unspecified: Secondary | ICD-10-CM | POA: Diagnosis not present

## 2020-06-12 DIAGNOSIS — Z23 Encounter for immunization: Secondary | ICD-10-CM | POA: Diagnosis not present

## 2020-06-12 DIAGNOSIS — Z1389 Encounter for screening for other disorder: Secondary | ICD-10-CM | POA: Diagnosis not present

## 2020-06-12 DIAGNOSIS — I5022 Chronic systolic (congestive) heart failure: Secondary | ICD-10-CM | POA: Diagnosis not present

## 2020-06-12 DIAGNOSIS — I10 Essential (primary) hypertension: Secondary | ICD-10-CM | POA: Diagnosis not present

## 2020-06-12 DIAGNOSIS — G2581 Restless legs syndrome: Secondary | ICD-10-CM | POA: Diagnosis not present

## 2020-06-12 DIAGNOSIS — Z79899 Other long term (current) drug therapy: Secondary | ICD-10-CM | POA: Diagnosis not present

## 2020-06-12 DIAGNOSIS — F322 Major depressive disorder, single episode, severe without psychotic features: Secondary | ICD-10-CM | POA: Diagnosis not present

## 2020-06-12 DIAGNOSIS — R7303 Prediabetes: Secondary | ICD-10-CM | POA: Diagnosis not present

## 2020-06-12 DIAGNOSIS — E7800 Pure hypercholesterolemia, unspecified: Secondary | ICD-10-CM | POA: Diagnosis not present

## 2020-06-12 NOTE — Telephone Encounter (Signed)
Dr. Gasper Sells, corrected cell number for Dr. Felipa Eth is [phone].

## 2020-06-12 NOTE — Telephone Encounter (Signed)
Dr. Felipa Eth called into triage today.. he was seeing the pt and wanted to report the pt is still having hypotension... systolic 88.   He reports that the pt is feeling fairly well, O2 Sat 94%,  Weight is stable at 182 lbs.... he plans to draw labwork on him today but he is reluctant to make any further med changes since we had recently stopped his Losartan and wanted Dr. Gasper Sells to take a look... his next ROV with [place] is 07/22/20.   He was not sure if we wanted to keep his diuretics the same in regards to his heart failure.   He says Dr. Gasper Sells can call or text him when he has a chance to review.   His cell [phone].   Will forward.

## 2020-06-15 NOTE — Telephone Encounter (Signed)
Discussed with Dr. Felipa Eth 06/14/20.  Aldactone has been stopped and is getting follow up labs.  Asymptomatic hypotension likely in the setting of HFrEF.  Will continue BB if asymptomatic given his frequent heart rhythm issues.  May be a possible Verqovo candidate in the future.  Rudean Haskell, MD [facility]  [address] [place], [place] [address] [phone]  4:46 PM

## 2020-06-18 DIAGNOSIS — I10 Essential (primary) hypertension: Secondary | ICD-10-CM | POA: Diagnosis not present

## 2020-06-18 DIAGNOSIS — Z79899 Other long term (current) drug therapy: Secondary | ICD-10-CM | POA: Diagnosis not present

## 2020-06-18 DIAGNOSIS — I5022 Chronic systolic (congestive) heart failure: Secondary | ICD-10-CM | POA: Diagnosis not present

## 2020-06-20 DIAGNOSIS — M961 Postlaminectomy syndrome, not elsewhere classified: Secondary | ICD-10-CM | POA: Diagnosis not present

## 2020-06-22 ENCOUNTER — Other Ambulatory Visit: Payer: PPO

## 2020-06-26 ENCOUNTER — Telehealth: Payer: Self-pay

## 2020-06-26 ENCOUNTER — Other Ambulatory Visit: Payer: Self-pay | Admitting: Neurological Surgery

## 2020-06-26 DIAGNOSIS — M961 Postlaminectomy syndrome, not elsewhere classified: Secondary | ICD-10-CM

## 2020-06-26 NOTE — Telephone Encounter (Signed)
Discontinued aldactone per Dr. Guy Begin note from 06/15/20.  See note below.    Discussed with Dr. Felipa Eth 06/14/20.  Aldactone has been stopped and is getting follow up labs.  Asymptomatic hypotension likely in the setting of HFrEF.  Will continue BB if asymptomatic given his frequent heart rhythm issues.  May be a possible Verqovo candidate in the future.  Rudean Haskell, MD [facility]  [address] [place], [place] [address] [phone]  4:46 PM

## 2020-06-27 ENCOUNTER — Telehealth: Payer: Self-pay | Admitting: *Deleted

## 2020-06-27 NOTE — Telephone Encounter (Signed)
   [facility] Pre-operative Risk Assessment    Patient Name: Ruben Barrera  DOB: Jul 13, 1932  MRN: [id number]   HEARTCARE STAFF: - Please ensure there is not already an duplicate clearance open for this procedure. - Under Visit Info/Reason for Call, type in Other and utilize the format Clearance MM/DD/YY or Clearance TBD. Do not use dashes or single digits. - If request is for dental extraction, please clarify the # of teeth to be extracted.  Request for surgical clearance:  1. What type of surgery is being performed? SPINAL CORD STIMULATOR via T8-9, T9-10 LAMINECTOMY   2. When is this surgery scheduled? 07/10/20   3. What type of clearance is required (medical clearance vs. Pharmacy clearance to hold med vs. Both)? MEDICAL  4. Are there any medications that need to be held prior to surgery and how long? ASA    5. Practice name and name of physician performing surgery? [facility]; DR. Sherley Bounds   6. What is the office phone number? [phone]   7.   What is the office fax number? [place]: VANESSA [phone]  8.   Anesthesia type (None, local, MAC, general) ? GENERAL   Julaine Hua 06/27/2020, 3:28 PM  _________________________________________________________________   (provider comments below)

## 2020-06-28 NOTE — Telephone Encounter (Signed)
Note, patient was previously cleared by Dr. Gasper Sells in January for the back procedure.  I left message for the patient to call back and speak to the on-call preop APP.  As long as his overall condition is unchanged, he is cleared to proceed with surgery.

## 2020-06-28 NOTE — Telephone Encounter (Signed)
   Patient Name: Ruben Barrera  DOB: 06/29/1932  MRN: [id number]   Primary Cardiologist: Werner Lean, MD  Chart reviewed as part of pre-operative protocol coverage. Patient was contacted 06/28/2020 in reference to pre-operative risk assessment for pending surgery as outlined below.  Ruben Barrera was last seen on 04/15/2020 by Dr. Gasper Sells.  Since that day, Ruben Barrera has done well without chest pain or worsening dyspnea..  Therefore, based on ACC/AHA guidelines, the patient would be at acceptable risk for the planned procedure without further cardiovascular testing.   He may hold aspirin for 7 days prior to the surgery and restart as soon as possible after the procedure at the surgeon's discretion.  The patient was advised that if he develops new symptoms prior to surgery to contact our office to arrange for a follow-up visit, and he verbalized understanding.  I will route this recommendation to the requesting party via Epic fax function and remove from pre-op pool. Please call with questions.  [place], [place] 06/28/2020, 12:27 PM

## 2020-07-08 ENCOUNTER — Encounter (HOSPITAL_COMMUNITY): Payer: Self-pay | Admitting: Neurological Surgery

## 2020-07-08 ENCOUNTER — Other Ambulatory Visit: Payer: Self-pay

## 2020-07-08 ENCOUNTER — Other Ambulatory Visit (HOSPITAL_COMMUNITY)
Admission: RE | Admit: 2020-07-08 | Discharge: 2020-07-08 | Disposition: A | Discharge status: Home / Self Care | Payer: PPO | Source: Ambulatory Visit | Attending: Neurological Surgery | Admitting: Neurological Surgery

## 2020-07-08 DIAGNOSIS — Z20822 Contact with and (suspected) exposure to covid-19: Secondary | ICD-10-CM | POA: Insufficient documentation

## 2020-07-08 DIAGNOSIS — Z01812 Encounter for preprocedural laboratory examination: Secondary | ICD-10-CM | POA: Diagnosis not present

## 2020-07-08 LAB — SARS CORONAVIRUS 2 (TAT 6-24 HRS): SARS Coronavirus 2: NEGATIVE

## 2020-07-08 NOTE — Progress Notes (Signed)
PCP - Dr Lajean Manes Cardiologist - Dr Rudean Haskell  Chest x-ray - 02/20/20 (1V) EKG - 03/28/20 Stress Test - 05/10/14 ECHO - 02/21/20 Cardiac Cath - 03/28/20  Aspirin Instructions: Last dose of ASA was on 07/04/20.   Anesthesia review: Yes  STOP now taking any Aspirin (unless otherwise instructed by your surgeon), Aleve, Naproxen, Ibuprofen, Motrin, Advil, Goody's, BC's, all herbal medications, fish oil, and all vitamins.   Coronavirus Screening Covid test is scheduled on 07/09/20 Do you have any of the following symptoms:  Cough yes/no: No Fever (>100.30F)  yes/no: No Runny nose yes/no: No Sore throat yes/no: No Difficulty breathing/shortness of breath  yes/no: No  Have you traveled in the last 14 days and where? yes/no: No  Patient verbalized understanding of instructions that were given via phone.

## 2020-07-09 NOTE — Anesthesia Preprocedure Evaluation (Addendum)
Anesthesia Evaluation  Patient identified by MRN, date of birth, ID band Patient awake    Reviewed: Allergy & Precautions, NPO status , Patient's Chart, lab work & pertinent test results, reviewed documented beta blocker date and time   History of Anesthesia Complications Negative for: history of anesthetic complications  Airway Mallampati: IV  TM Distance: >3 FB Neck ROM: Full    Dental  (+) Dental Advisory Given   Pulmonary Current Smoker and Patient abstained from smoking.,  07/08/2020 SARS coronavirus NEG   breath sounds clear to auscultation       Cardiovascular hypertension, Pt. on medications and Pt. on home beta blockers (-) angina+ CAD (mod ASCAD) and + DOE  + dysrhythmias Supra Ventricular Tachycardia and Ventricular Tachycardia + Valvular Problems/Murmurs AS  Rhythm:Regular Rate:Normal + Systolic murmurs 26/8341 ECHO: EF 30-35%, global hypokinesis, severe LVH, mild MR, mild-mod AS with mean grad 25 mmHg,    Neuro/Psych Depression Back pain: narcotics CVA, No Residual Symptoms    GI/Hepatic Neg liver ROS, GERD  Medicated and Controlled,  Endo/Other  negative endocrine ROS  Renal/GU negative Renal ROS     Musculoskeletal  (+) Arthritis ,   Abdominal   Peds  Hematology negative hematology ROS (+)   Anesthesia Other Findings   Reproductive/Obstetrics                           Anesthesia Physical Anesthesia Plan  ASA: IV  Anesthesia Plan: General   Post-op Pain Management:    Induction: Intravenous  PONV Risk Score and Plan: 1 and Ondansetron and Dexamethasone  Airway Management Planned: Oral ETT and Video Laryngoscope Planned  Additional Equipment: Arterial line  Intra-op Plan:   Post-operative Plan: Extubation in OR  Informed Consent: I have reviewed the patients History and Physical, chart, labs and discussed the procedure including the risks, benefits and alternatives  for the proposed anesthesia with the patient or authorized representative who has indicated his/her understanding and acceptance.     Dental advisory given  Plan Discussed with: CRNA and Surgeon  Anesthesia Plan Comments:        Anesthesia Quick Evaluation

## 2020-07-10 ENCOUNTER — Ambulatory Visit (HOSPITAL_COMMUNITY): Payer: PPO | Admitting: Anesthesiology

## 2020-07-10 ENCOUNTER — Ambulatory Visit (HOSPITAL_COMMUNITY)
Admission: RE | Admit: 2020-07-10 | Discharge: 2020-07-10 | Disposition: A | Discharge status: Home / Self Care | Payer: PPO | Source: Ambulatory Visit | Attending: Neurological Surgery | Admitting: Neurological Surgery

## 2020-07-10 ENCOUNTER — Encounter (HOSPITAL_COMMUNITY): Payer: Self-pay | Admitting: Neurological Surgery

## 2020-07-10 ENCOUNTER — Ambulatory Visit (HOSPITAL_COMMUNITY): Payer: PPO

## 2020-07-10 ENCOUNTER — Encounter (HOSPITAL_COMMUNITY)
Admission: RE | Disposition: A | Discharge status: Home / Self Care | Payer: Self-pay | Source: Ambulatory Visit | Attending: Neurological Surgery

## 2020-07-10 ENCOUNTER — Other Ambulatory Visit: Payer: Self-pay

## 2020-07-10 DIAGNOSIS — E782 Mixed hyperlipidemia: Secondary | ICD-10-CM | POA: Diagnosis not present

## 2020-07-10 DIAGNOSIS — Z96653 Presence of artificial knee joint, bilateral: Secondary | ICD-10-CM | POA: Diagnosis not present

## 2020-07-10 DIAGNOSIS — I517 Cardiomegaly: Secondary | ICD-10-CM | POA: Diagnosis not present

## 2020-07-10 DIAGNOSIS — Z7982 Long term (current) use of aspirin: Secondary | ICD-10-CM | POA: Diagnosis not present

## 2020-07-10 DIAGNOSIS — Z791 Long term (current) use of non-steroidal anti-inflammatories (NSAID): Secondary | ICD-10-CM | POA: Insufficient documentation

## 2020-07-10 DIAGNOSIS — Z888 Allergy status to other drugs, medicaments and biological substances status: Secondary | ICD-10-CM | POA: Insufficient documentation

## 2020-07-10 DIAGNOSIS — Z823 Family history of stroke: Secondary | ICD-10-CM | POA: Diagnosis not present

## 2020-07-10 DIAGNOSIS — E7800 Pure hypercholesterolemia, unspecified: Secondary | ICD-10-CM | POA: Diagnosis not present

## 2020-07-10 DIAGNOSIS — J9811 Atelectasis: Secondary | ICD-10-CM | POA: Diagnosis not present

## 2020-07-10 DIAGNOSIS — I119 Hypertensive heart disease without heart failure: Secondary | ICD-10-CM | POA: Insufficient documentation

## 2020-07-10 DIAGNOSIS — I2510 Atherosclerotic heart disease of native coronary artery without angina pectoris: Secondary | ICD-10-CM | POA: Diagnosis not present

## 2020-07-10 DIAGNOSIS — Z981 Arthrodesis status: Secondary | ICD-10-CM | POA: Diagnosis not present

## 2020-07-10 DIAGNOSIS — M47816 Spondylosis without myelopathy or radiculopathy, lumbar region: Secondary | ICD-10-CM | POA: Diagnosis present

## 2020-07-10 DIAGNOSIS — Z89421 Acquired absence of other right toe(s): Secondary | ICD-10-CM | POA: Insufficient documentation

## 2020-07-10 DIAGNOSIS — Z79899 Other long term (current) drug therapy: Secondary | ICD-10-CM | POA: Insufficient documentation

## 2020-07-10 DIAGNOSIS — F17290 Nicotine dependence, other tobacco product, uncomplicated: Secondary | ICD-10-CM | POA: Diagnosis not present

## 2020-07-10 DIAGNOSIS — Z8249 Family history of ischemic heart disease and other diseases of the circulatory system: Secondary | ICD-10-CM | POA: Diagnosis not present

## 2020-07-10 DIAGNOSIS — M47814 Spondylosis without myelopathy or radiculopathy, thoracic region: Secondary | ICD-10-CM | POA: Diagnosis not present

## 2020-07-10 DIAGNOSIS — Z9689 Presence of other specified functional implants: Secondary | ICD-10-CM

## 2020-07-10 DIAGNOSIS — M961 Postlaminectomy syndrome, not elsewhere classified: Secondary | ICD-10-CM | POA: Diagnosis not present

## 2020-07-10 DIAGNOSIS — Z8673 Personal history of transient ischemic attack (TIA), and cerebral infarction without residual deficits: Secondary | ICD-10-CM | POA: Insufficient documentation

## 2020-07-10 DIAGNOSIS — I471 Supraventricular tachycardia: Secondary | ICD-10-CM | POA: Diagnosis not present

## 2020-07-10 DIAGNOSIS — Z419 Encounter for procedure for purposes other than remedying health state, unspecified: Secondary | ICD-10-CM

## 2020-07-10 DIAGNOSIS — Z01818 Encounter for other preprocedural examination: Secondary | ICD-10-CM | POA: Diagnosis not present

## 2020-07-10 HISTORY — DX: Atherosclerotic heart disease of native coronary artery without angina pectoris: I25.10

## 2020-07-10 HISTORY — PX: THORACIC DISCECTOMY: SHX6113

## 2020-07-10 LAB — CBC WITH DIFFERENTIAL/PLATELET
Abs Immature Granulocytes: 0.02 10*3/uL (ref 0.00–0.07)
Basophils Absolute: 0.1 10*3/uL (ref 0.0–0.1)
Basophils Relative: 1 %
Eosinophils Absolute: 0.3 10*3/uL (ref 0.0–0.5)
Eosinophils Relative: 5 %
HCT: 46.9 % (ref 39.0–52.0)
Hemoglobin: 15.3 g/dL (ref 13.0–17.0)
Immature Granulocytes: 0 %
Lymphocytes Relative: 25 %
Lymphs Abs: 1.7 10*3/uL (ref 0.7–4.0)
MCH: 31.5 pg (ref 26.0–34.0)
MCHC: 32.6 g/dL (ref 30.0–36.0)
MCV: 96.5 fL (ref 80.0–100.0)
Monocytes Absolute: 0.7 10*3/uL (ref 0.1–1.0)
Monocytes Relative: 11 %
Neutro Abs: 3.9 10*3/uL (ref 1.7–7.7)
Neutrophils Relative %: 58 %
Platelets: 225 10*3/uL (ref 150–400)
RBC: 4.86 MIL/uL (ref 4.22–5.81)
RDW: 12.7 % (ref 11.5–15.5)
WBC: 6.7 10*3/uL (ref 4.0–10.5)
nRBC: 0 % (ref 0.0–0.2)

## 2020-07-10 LAB — BASIC METABOLIC PANEL
Anion gap: 7 (ref 5–15)
BUN: 26 mg/dL — ABNORMAL HIGH (ref 8–23)
CO2: 27 mmol/L (ref 22–32)
Calcium: 8.6 mg/dL — ABNORMAL LOW (ref 8.9–10.3)
Chloride: 104 mmol/L (ref 98–111)
Creatinine, Ser: 1.11 mg/dL (ref 0.61–1.24)
GFR, Estimated: >60 mL/min (ref >60)
Glucose, Bld: 110 mg/dL — ABNORMAL HIGH (ref 70–99)
Potassium: 4.2 mmol/L (ref 3.5–5.1)
Sodium: 138 mmol/L (ref 135–145)

## 2020-07-10 LAB — PROTIME-INR
INR: 1.1 (ref 0.8–1.2)
Prothrombin Time: 14.1 seconds (ref 11.4–15.2)

## 2020-07-10 LAB — SURGICAL PCR SCREEN
MRSA, PCR: NEGATIVE
Staphylococcus aureus: NEGATIVE

## 2020-07-10 SURGERY — THORACIC DISCECTOMY
Anesthesia: General | Site: Back

## 2020-07-10 MED ORDER — THROMBIN 5000 UNITS EX SOLR
CUTANEOUS | Status: AC
  Filled 2020-07-10: qty 15000

## 2020-07-10 MED ORDER — DEXAMETHASONE SODIUM PHOSPHATE 10 MG/ML IJ SOLN
10.0000 mg | Freq: Once | INTRAMUSCULAR | Status: AC
  Administered 2020-07-10: 10 mg via INTRAVENOUS
  Filled 2020-07-10: qty 1

## 2020-07-10 MED ORDER — GABAPENTIN 400 MG PO CAPS: 400.0000 mg | ORAL_CAPSULE | Freq: Every day | ORAL | Status: DC

## 2020-07-10 MED ORDER — SACCHAROMYCES BOULARDII 250 MG PO CAPS
250.0000 mg | ORAL_CAPSULE | Freq: Every day | ORAL | Status: DC
  Filled 2020-07-10: qty 1

## 2020-07-10 MED ORDER — LACTATED RINGERS IV SOLN: INTRAVENOUS | Status: DC | PRN

## 2020-07-10 MED ORDER — ROCURONIUM 10MG/ML (10ML) SYRINGE FOR MEDFUSION PUMP - OPTIME
INTRAVENOUS | Status: DC | PRN
  Administered 2020-07-10: 60 mg via INTRAVENOUS

## 2020-07-10 MED ORDER — TAMSULOSIN HCL 0.4 MG PO CAPS
0.4000 mg | ORAL_CAPSULE | Freq: Every day | ORAL | Status: DC
  Administered 2020-07-10: 0.4 mg via ORAL
  Filled 2020-07-10: qty 1

## 2020-07-10 MED ORDER — ACETAMINOPHEN 650 MG RE SUPP: 650.0000 mg | RECTAL | Status: DC | PRN

## 2020-07-10 MED ORDER — DEXAMETHASONE 4 MG PO TABS
4.0000 mg | ORAL_TABLET | Freq: Four times a day (QID) | ORAL | Status: DC
  Administered 2020-07-10: 4 mg via ORAL
  Filled 2020-07-10: qty 1

## 2020-07-10 MED ORDER — EZETIMIBE 10 MG PO TABS
10.0000 mg | ORAL_TABLET | Freq: Every evening | ORAL | Status: DC
  Filled 2020-07-10: qty 1

## 2020-07-10 MED ORDER — SENNOSIDES-DOCUSATE SODIUM 8.6-50 MG PO TABS: 1.0000 | ORAL_TABLET | Freq: Two times a day (BID) | ORAL | Status: DC

## 2020-07-10 MED ORDER — SUFENTANIL CITRATE 50 MCG/ML IV SOLN
INTRAVENOUS | Status: AC
  Filled 2020-07-10: qty 1

## 2020-07-10 MED ORDER — CEFAZOLIN SODIUM-DEXTROSE 2-4 GM/100ML-% IV SOLN
2.0000 g | INTRAVENOUS | Status: AC
  Administered 2020-07-10: 2 g via INTRAVENOUS
  Filled 2020-07-10: qty 100

## 2020-07-10 MED ORDER — CHLORHEXIDINE GLUCONATE CLOTH 2 % EX PADS: 6.0000 | MEDICATED_PAD | Freq: Once | CUTANEOUS | Status: DC

## 2020-07-10 MED ORDER — GABAPENTIN 600 MG PO TABS
600.0000 mg | ORAL_TABLET | Freq: Three times a day (TID) | ORAL | Status: DC
  Administered 2020-07-10: 600 mg via ORAL
  Filled 2020-07-10: qty 1

## 2020-07-10 MED ORDER — VANCOMYCIN HCL 1000 MG IV SOLR
INTRAVENOUS | Status: DC | PRN
  Administered 2020-07-10: 1000 mg via TOPICAL

## 2020-07-10 MED ORDER — ONDANSETRON HCL 4 MG PO TABS: 4.0000 mg | ORAL_TABLET | Freq: Four times a day (QID) | ORAL | Status: DC | PRN

## 2020-07-10 MED ORDER — SODIUM CHLORIDE (PF) 0.9 % IJ SOLN
INTRAMUSCULAR | Status: AC
  Filled 2020-07-10: qty 10

## 2020-07-10 MED ORDER — MORPHINE SULFATE (PF) 2 MG/ML IV SOLN: 2.0000 mg | INTRAVENOUS | Status: DC | PRN

## 2020-07-10 MED ORDER — ONDANSETRON HCL 4 MG/2ML IJ SOLN
INTRAMUSCULAR | Status: AC
  Filled 2020-07-10: qty 2

## 2020-07-10 MED ORDER — PHENYLEPHRINE HCL-NACL 10-0.9 MG/250ML-% IV SOLN
INTRAVENOUS | Status: DC | PRN
  Administered 2020-07-10: 50 ug/min via INTRAVENOUS

## 2020-07-10 MED ORDER — DEXAMETHASONE SODIUM PHOSPHATE 4 MG/ML IJ SOLN: 4.0000 mg | Freq: Four times a day (QID) | INTRAMUSCULAR | Status: DC

## 2020-07-10 MED ORDER — BUPIVACAINE HCL (PF) 0.25 % IJ SOLN
INTRAMUSCULAR | Status: AC
  Filled 2020-07-10: qty 30

## 2020-07-10 MED ORDER — BUPIVACAINE HCL (PF) 0.25 % IJ SOLN
INTRAMUSCULAR | Status: DC | PRN
  Administered 2020-07-10: 8 mL

## 2020-07-10 MED ORDER — MENTHOL 3 MG MT LOZG: 1.0000 | LOZENGE | OROMUCOSAL | Status: DC | PRN

## 2020-07-10 MED ORDER — HEMOSTATIC AGENTS (NO CHARGE) OPTIME
TOPICAL | Status: DC | PRN
  Administered 2020-07-10: 1 via TOPICAL

## 2020-07-10 MED ORDER — FENTANYL CITRATE (PF) 100 MCG/2ML IJ SOLN
INTRAMUSCULAR | Status: AC
  Filled 2020-07-10: qty 2

## 2020-07-10 MED ORDER — FUROSEMIDE 20 MG PO TABS
20.0000 mg | ORAL_TABLET | Freq: Every day | ORAL | Status: DC
  Administered 2020-07-10: 20 mg via ORAL
  Filled 2020-07-10: qty 1

## 2020-07-10 MED ORDER — LIDOCAINE HCL (CARDIAC) PF 100 MG/5ML IV SOSY
PREFILLED_SYRINGE | INTRAVENOUS | Status: DC | PRN
  Administered 2020-07-10: 25 mg via INTRAVENOUS

## 2020-07-10 MED ORDER — 0.9 % SODIUM CHLORIDE (POUR BTL) OPTIME
TOPICAL | Status: DC | PRN
  Administered 2020-07-10: 1000 mL

## 2020-07-10 MED ORDER — FENTANYL CITRATE (PF) 100 MCG/2ML IJ SOLN
25.0000 ug | INTRAMUSCULAR | Status: DC | PRN
  Administered 2020-07-10: 25 ug via INTRAVENOUS

## 2020-07-10 MED ORDER — PROPOFOL 10 MG/ML IV BOLUS
INTRAVENOUS | Status: AC
  Filled 2020-07-10: qty 20

## 2020-07-10 MED ORDER — CELECOXIB 200 MG PO CAPS: 200.0000 mg | ORAL_CAPSULE | Freq: Every day | ORAL | Status: DC

## 2020-07-10 MED ORDER — CHLORHEXIDINE GLUCONATE 0.12 % MT SOLN
OROMUCOSAL | Status: AC
  Administered 2020-07-10: 15 mL
  Filled 2020-07-10: qty 15

## 2020-07-10 MED ORDER — SODIUM CHLORIDE 0.9% FLUSH
3.0000 mL | Freq: Two times a day (BID) | INTRAVENOUS | Status: DC
  Administered 2020-07-10: 3 mL via INTRAVENOUS

## 2020-07-10 MED ORDER — CEFAZOLIN SODIUM-DEXTROSE 2-4 GM/100ML-% IV SOLN: 2.0000 g | Freq: Three times a day (TID) | INTRAVENOUS | Status: DC

## 2020-07-10 MED ORDER — SENNA 8.6 MG PO TABS: 1.0000 | ORAL_TABLET | Freq: Two times a day (BID) | ORAL | Status: DC

## 2020-07-10 MED ORDER — SODIUM CHLORIDE 0.9% FLUSH: 3.0000 mL | INTRAVENOUS | Status: DC | PRN

## 2020-07-10 MED ORDER — THROMBIN 5000 UNITS EX SOLR
CUTANEOUS | Status: DC | PRN
  Administered 2020-07-10 (×2): 5000 [IU] via TOPICAL

## 2020-07-10 MED ORDER — PHENOL 1.4 % MT LIQD: 1.0000 | OROMUCOSAL | Status: DC | PRN

## 2020-07-10 MED ORDER — VANCOMYCIN HCL 1000 MG IV SOLR
INTRAVENOUS | Status: AC
  Filled 2020-07-10: qty 1000

## 2020-07-10 MED ORDER — ONDANSETRON HCL 4 MG/2ML IJ SOLN: 4.0000 mg | Freq: Four times a day (QID) | INTRAMUSCULAR | Status: DC | PRN

## 2020-07-10 MED ORDER — LABETALOL HCL 5 MG/ML IV SOLN
INTRAVENOUS | Status: DC | PRN
  Administered 2020-07-10 (×4): 5 mg via INTRAVENOUS

## 2020-07-10 MED ORDER — SUFENTANIL CITRATE 50 MCG/ML IV SOLN
INTRAVENOUS | Status: DC | PRN
  Administered 2020-07-10: 5 ug via INTRAVENOUS
  Administered 2020-07-10: 15 ug via INTRAVENOUS

## 2020-07-10 MED ORDER — BACLOFEN 5 MG HALF TABLET
5.0000 mg | ORAL_TABLET | Freq: Three times a day (TID) | ORAL | Status: DC | PRN
  Filled 2020-07-10: qty 1

## 2020-07-10 MED ORDER — SODIUM CHLORIDE 0.9 % IV SOLN: 250.0000 mL | INTRAVENOUS | Status: DC

## 2020-07-10 MED ORDER — ONDANSETRON HCL 4 MG/2ML IJ SOLN
INTRAMUSCULAR | Status: DC | PRN
  Administered 2020-07-10: 4 mg via INTRAVENOUS

## 2020-07-10 MED ORDER — THROMBIN 5000 UNITS EX SOLR
OROMUCOSAL | Status: DC | PRN
  Administered 2020-07-10: 5 mL via TOPICAL

## 2020-07-10 MED ORDER — LIDOCAINE 2% (20 MG/ML) 5 ML SYRINGE
INTRAMUSCULAR | Status: AC
  Filled 2020-07-10: qty 5

## 2020-07-10 MED ORDER — PROPOFOL 10 MG/ML IV BOLUS
INTRAVENOUS | Status: DC | PRN
  Administered 2020-07-10: 60 mg via INTRAVENOUS

## 2020-07-10 MED ORDER — METOPROLOL SUCCINATE ER 25 MG PO TB24: 25.0000 mg | ORAL_TABLET | Freq: Every day | ORAL | Status: DC

## 2020-07-10 MED ORDER — CITALOPRAM HYDROBROMIDE 20 MG PO TABS: 10.0000 mg | ORAL_TABLET | Freq: Every day | ORAL | Status: DC

## 2020-07-10 MED ORDER — ACETAMINOPHEN 325 MG PO TABS: 650.0000 mg | ORAL_TABLET | ORAL | Status: DC | PRN

## 2020-07-10 MED ORDER — POTASSIUM CHLORIDE IN NACL 20-0.9 MEQ/L-% IV SOLN: INTRAVENOUS | Status: DC

## 2020-07-10 MED ORDER — SUGAMMADEX SODIUM 200 MG/2ML IV SOLN
INTRAVENOUS | Status: DC | PRN
  Administered 2020-07-10: 200 mg via INTRAVENOUS

## 2020-07-10 MED ORDER — HYDROCODONE-ACETAMINOPHEN 7.5-325 MG PO TABS
1.0000 | ORAL_TABLET | Freq: Three times a day (TID) | ORAL | Status: DC | PRN
  Administered 2020-07-10: 1 via ORAL
  Filled 2020-07-10: qty 1

## 2020-07-10 MED ORDER — ACETAMINOPHEN 500 MG PO TABS
1000.0000 mg | ORAL_TABLET | ORAL | Status: AC
  Administered 2020-07-10: 1000 mg via ORAL
  Filled 2020-07-10: qty 2

## 2020-07-10 MED ORDER — ROCURONIUM BROMIDE 10 MG/ML (PF) SYRINGE
PREFILLED_SYRINGE | INTRAVENOUS | Status: AC
  Filled 2020-07-10: qty 10

## 2020-07-10 SURGICAL SUPPLY — 58 items
APL SKNCLS STERI-STRIP NONHPOA (GAUZE/BANDAGES/DRESSINGS) ×1
BAND INSRT 18 STRL LF DISP RB (MISCELLANEOUS) ×2
BAND RUBBER #18 3X1/16 STRL (MISCELLANEOUS) ×4 IMPLANT
BENZOIN TINCTURE PRP APPL 2/3 (GAUZE/BANDAGES/DRESSINGS) ×2 IMPLANT
BUR CARBIDE MATCH 3.0 (BURR) ×2 IMPLANT
CANISTER SUCT 3000ML PPV (MISCELLANEOUS) ×2 IMPLANT
CONTROL REMOTE FREELINK ALPHA (NEUROSURGERY SUPPLIES) ×1 IMPLANT
COVER WAND RF STERILE (DRAPES) ×2 IMPLANT
DIFFUSER DRILL AIR PNEUMATIC (MISCELLANEOUS) ×1 IMPLANT
DRAPE C-ARM 42X72 X-RAY (DRAPES) ×1 IMPLANT
DRAPE C-ARMOR (DRAPES) ×1 IMPLANT
DRAPE LAPAROTOMY 100X72X124 (DRAPES) ×2 IMPLANT
DRAPE MICROSCOPE LEICA (MISCELLANEOUS) ×1 IMPLANT
DRAPE SURG 17X23 STRL (DRAPES) ×5 IMPLANT
DRSG OPSITE POSTOP 3X4 (GAUZE/BANDAGES/DRESSINGS) ×2 IMPLANT
DURAPREP 26ML APPLICATOR (WOUND CARE) ×2 IMPLANT
ELECT REM PT RETURN 9FT ADLT (ELECTROSURGICAL) ×2
ELECTRODE REM PT RTRN 9FT ADLT (ELECTROSURGICAL) ×1 IMPLANT
ELEVATER PASSER (SPINAL CORD STIMULATOR) ×2
GAUZE 4X4 16PLY RFD (DISPOSABLE) IMPLANT
GLOVE BIO SURGEON STRL SZ7 (GLOVE) IMPLANT
GLOVE BIO SURGEON STRL SZ8 (GLOVE) ×2 IMPLANT
GLOVE SURG ENC MOIS LTX SZ8 (GLOVE) ×1 IMPLANT
GLOVE SURG POLYISO LF SZ7 (GLOVE) ×3 IMPLANT
GLOVE SURG POLYISO LF SZ7.5 (GLOVE) ×3 IMPLANT
GLOVE SURG UNDER POLY LF SZ7 (GLOVE) IMPLANT
GOWN STRL REUS W/ TWL LRG LVL3 (GOWN DISPOSABLE) IMPLANT
GOWN STRL REUS W/ TWL XL LVL3 (GOWN DISPOSABLE) ×1 IMPLANT
GOWN STRL REUS W/TWL 2XL LVL3 (GOWN DISPOSABLE) IMPLANT
GOWN STRL REUS W/TWL LRG LVL3 (GOWN DISPOSABLE) ×6
GOWN STRL REUS W/TWL XL LVL3 (GOWN DISPOSABLE) ×2
HEMOSTAT POWDER KIT SURGIFOAM (HEMOSTASIS) IMPLANT
KIT BASIN OR (CUSTOM PROCEDURE TRAY) ×2 IMPLANT
KIT CHARGING (KITS) ×1
KIT CHARGING PRECISION NEURO (KITS) IMPLANT
KIT IPG ALPHA WAVEWRITER (Stimulator) ×1 IMPLANT
KIT TURNOVER KIT B (KITS) ×2 IMPLANT
LEAD COVER EDGE 50CM STIM KIT (Lead) ×1 IMPLANT
NDL HYPO 25X1 1.5 SAFETY (NEEDLE) ×1 IMPLANT
NDL SPNL 20GX3.5 QUINCKE YW (NEEDLE) IMPLANT
NEEDLE HYPO 25X1 1.5 SAFETY (NEEDLE) ×2 IMPLANT
NEEDLE SPNL 20GX3.5 QUINCKE YW (NEEDLE) IMPLANT
NS IRRIG 1000ML POUR BTL (IV SOLUTION) ×2 IMPLANT
PACK LAMINECTOMY NEURO (CUSTOM PROCEDURE TRAY) ×2 IMPLANT
PAD ARMBOARD 7.5X6 YLW CONV (MISCELLANEOUS) ×6 IMPLANT
PASSER ELEVATOR (SPINAL CORD STIMULATOR) IMPLANT
SPONGE SURGIFOAM ABS GEL SZ50 (HEMOSTASIS) ×2 IMPLANT
STAPLER VISISTAT 35W (STAPLE) ×1 IMPLANT
STRIP CLOSURE SKIN 1/2X4 (GAUZE/BANDAGES/DRESSINGS) ×2 IMPLANT
SUT SILK 2 0 PERMA HAND 18 BK (SUTURE) ×1 IMPLANT
SUT VIC AB 0 CT1 18XCR BRD8 (SUTURE) ×1 IMPLANT
SUT VIC AB 0 CT1 8-18 (SUTURE) ×2
SUT VIC AB 2-0 CP2 18 (SUTURE) ×3 IMPLANT
SUT VIC AB 3-0 SH 8-18 (SUTURE) ×4 IMPLANT
TOOL LONG TUNNEL (SPINAL CORD STIMULATOR) ×1 IMPLANT
TOWEL GREEN STERILE (TOWEL DISPOSABLE) ×2 IMPLANT
TOWEL GREEN STERILE FF (TOWEL DISPOSABLE) ×2 IMPLANT
WATER STERILE IRR 1000ML POUR (IV SOLUTION) ×2 IMPLANT

## 2020-07-10 NOTE — Progress Notes (Signed)
75 mcg IV Fentanyl wasted with Leanne Chang, RN after patient discharge.

## 2020-07-10 NOTE — Anesthesia Postprocedure Evaluation (Signed)
Anesthesia Post Note  Patient: Ruben Barrera  Procedure(s) Performed: Spinal cord stimulator via  - Thoracic Eight-Thoracic Nine, Thoracic Nine-Thoracic Ten Laminectomy (N/A Back)     Patient location during evaluation: PACU Anesthesia Type: General Level of consciousness: awake and alert, patient cooperative and oriented Pain management: pain level controlled Vital Signs Assessment: post-procedure vital signs reviewed and stable Respiratory status: spontaneous breathing, nonlabored ventilation and respiratory function stable Cardiovascular status: blood pressure returned to baseline and stable Postop Assessment: no apparent nausea or vomiting Anesthetic complications: no   No complications documented.  Last Vitals:  Vitals:   07/10/20 1150 07/10/20 1218  BP: (!) 144/89 (!) 155/84  Pulse: 71 76  Resp: 14 17  Temp: 36.4 C (!) 36.3 C  SpO2: 97% 99%    Last Pain:  Vitals:   07/10/20 1218  TempSrc: Oral  PainSc:                  Tequan Redmon,E. Tayten Bergdoll

## 2020-07-10 NOTE — Anesthesia Procedure Notes (Signed)
Procedure Name: Intubation Date/Time: 07/10/2020 8:38 AM Performed by: Claris Che, CRNA Pre-anesthesia Checklist: Patient identified, Emergency Drugs available, Suction available, Patient being monitored and Timeout performed Patient Re-evaluated:Patient Re-evaluated prior to induction Oxygen Delivery Method: Circle system utilized Preoxygenation: Pre-oxygenation with 100% oxygen Induction Type: IV induction and Cricoid Pressure applied Ventilation: Mask ventilation without difficulty Laryngoscope Size: Mac and 4 Grade View: Grade III Tube type: Oral Tube size: 8.0 mm Number of attempts: 1 Airway Equipment and Method: Stylet Placement Confirmation: ETT inserted through vocal cords under direct vision,  positive ETCO2 and breath sounds checked- equal and bilateral Secured at: 23 cm Tube secured with: Tape Dental Injury: Teeth and Oropharynx as per pre-operative assessment

## 2020-07-10 NOTE — Discharge Summary (Signed)
Physician Discharge Summary  Patient ID: Ruben Barrera MRN: [id number] DOB/AGE: [birth year] 87 y.o.  Admit date: 07/10/2020 Discharge date: 07/10/2020  Admission Diagnoses: failed back syndrome, thoracic spondylosis    Discharge Diagnoses: same   Discharged Condition: good  Hospital Course: The patient was admitted on 07/10/2020 and taken to the operating room where the patient underwent SCS. The patient tolerated the procedure well and was taken to the recovery room and then to the floor in stable condition. The hospital course was routine. There were no complications. The wound remained clean dry and intact. Pt had appropriate back soreness. No complaints of leg pain or new N/T/W. The patient remained afebrile with stable vital signs, and tolerated a regular diet. The patient continued to increase activities, and pain was well controlled with oral pain medications.   Consults: None  Significant Diagnostic Studies:  Results for orders placed or performed during the hospital encounter of 07/10/20  Surgical pcr screen   Specimen: Nasal Mucosa; Nasal Swab  Result Value Ref Range   MRSA, PCR NEGATIVE NEGATIVE   Staphylococcus aureus NEGATIVE NEGATIVE  Basic metabolic panel  Result Value Ref Range   Sodium 138 135 - 145 mmol/L   Potassium 4.2 3.5 - 5.1 mmol/L   Chloride 104 98 - 111 mmol/L   CO2 27 22 - 32 mmol/L   Glucose, Bld 110 (H) 70 - 99 mg/dL   BUN 26 (H) 8 - 23 mg/dL   Creatinine, Ser 1.11 0.61 - 1.24 mg/dL   Calcium 8.6 (L) 8.9 - 10.3 mg/dL   GFR, Estimated >60 >60 mL/min   Anion gap 7 5 - 15  CBC WITH DIFFERENTIAL  Result Value Ref Range   WBC 6.7 4.0 - 10.5 K/uL   RBC 4.86 4.22 - 5.81 MIL/uL   Hemoglobin 15.3 13.0 - 17.0 g/dL   HCT 46.9 39.0 - 52.0 %   MCV 96.5 80.0 - 100.0 fL   MCH 31.5 26.0 - 34.0 pg   MCHC 32.6 30.0 - 36.0 g/dL   RDW 12.7 11.5 - 15.5 %   Platelets 225 150 - 400 K/uL   nRBC 0.0 0.0 - 0.2 %   Neutrophils Relative % 58 %   Neutro Abs 3.9 1.7 -  7.7 K/uL   Lymphocytes Relative 25 %   Lymphs Abs 1.7 0.7 - 4.0 K/uL   Monocytes Relative 11 %   Monocytes Absolute 0.7 0.1 - 1.0 K/uL   Eosinophils Relative 5 %   Eosinophils Absolute 0.3 0.0 - 0.5 K/uL   Basophils Relative 1 %   Basophils Absolute 0.1 0.0 - 0.1 K/uL   Immature Granulocytes 0 %   Abs Immature Granulocytes 0.02 0.00 - 0.07 K/uL  Protime-INR  Result Value Ref Range   Prothrombin Time 14.1 11.4 - 15.2 seconds   INR 1.1 0.8 - 1.2    Chest 2 View  Result Date: 07/10/2020 CLINICAL DATA:  Preoperative exam. EXAM: CHEST - 2 VIEW COMPARISON:  Chest x-ray 02/20/2020.  CT neck 06/20/2019. FINDINGS: Patient rotated to the right. Stable mild mediastinal prominence, most likely prominent great vessels. Stable cardiomegaly. Low lung volumes with mild bibasilar atelectasis. No pleural effusion or pneumothorax. Severe degenerative changes both shoulders. Loose bodies noted about the left shoulder. Thoracolumbar spine scoliosis and degenerative change. IMPRESSION: 1.  Stable cardiomegaly. 2.  Low lung volumes with mild bibasilar atelectasis. Electronically Signed   By: Marcello Moores  Register   On: 07/10/2020 07:21   DG Thoracic Spine 1 View  Result Date: 07/10/2020  CLINICAL DATA:  Provided history: Surgery, elective. Additional history provided: Thoracic 8-thoracic 9, thoracic 9-thoracic 10 laminectomy. Provided fluoroscopy time 16.8 seconds (10.45 mGy). EXAM: OPERATIVE THORACIC SPINE single VIEW COMPARISON:  Thoracic spine MRI 05/22/2020. FINDINGS: A single AP intraoperative fluoroscopic image of the thoracic spine is submitted. On the provided image, spinal cord stimulator leads project over the thoracic spinal canal. The leads have a somewhat tortuous course. The level of lead termination cannot be determined given the provided field of view. IMPRESSION: Single AP intraoperative fluoroscopic image of the thoracic spine from reported T8-T9 and T9-T10 laminectomy with spinal cord stimulator  placement, as described. Electronically Signed   By: Kellie Simmering DO   On: 07/10/2020 10:34   DG C-Arm 1-60 Min  Result Date: 07/10/2020 CLINICAL DATA:  Provided history: Surgery, elective. Additional history provided: Thoracic 8-thoracic 9, thoracic 9-thoracic 10 laminectomy. Provided fluoroscopy time 16.8 seconds (10.45 mGy). EXAM: OPERATIVE THORACIC SPINE single VIEW COMPARISON:  Thoracic spine MRI 05/22/2020. FINDINGS: A single AP intraoperative fluoroscopic image of the thoracic spine is submitted. On the provided image, spinal cord stimulator leads project over the thoracic spinal canal. The leads have a somewhat tortuous course. The level of lead termination cannot be determined given the provided field of view. IMPRESSION: Single AP intraoperative fluoroscopic image of the thoracic spine from reported T8-T9 and T9-T10 laminectomy with spinal cord stimulator placement, as described. Electronically Signed   By: Kellie Simmering DO   On: 07/10/2020 10:34    Antibiotics:  Anti-infectives (From admission, onward)   Start     Dose/Rate Route Frequency Ordered Stop   07/10/20 1645  ceFAZolin (ANCEF) IVPB 2g/100 mL premix        2 g 200 mL/hr over 30 Minutes Intravenous Every 8 hours 07/10/20 1221 07/11/20 0844   07/10/20 0947  vancomycin (VANCOCIN) powder  Status:  Discontinued          As needed 07/10/20 0948 07/10/20 1045   07/10/20 0700  ceFAZolin (ANCEF) IVPB 2g/100 mL premix        2 g 200 mL/hr over 30 Minutes Intravenous On call to O.R. 07/10/20 7858 07/10/20 0856      Discharge Exam: Blood pressure (!) 155/84, pulse 76, temperature (!) 97.4 F (36.3 C), temperature source Oral, resp. rate 17, height 5\' 6"  (1.676 m), weight 82.6 kg, SpO2 99 %. Neurologic: Grossly normal Dressing dry  Discharge Medications:   Allergies as of 07/10/2020      Reactions   Simvastatin Other (See Comments)   Leg pains      Medication List    TAKE these medications   acetaminophen 325 MG  tablet Commonly known as: TYLENOL Take 2 tablets (650 mg total) by mouth every 4 (four) hours as needed for mild pain (or temp > 37.5 C (99.5 F)).   aspirin 81 MG EC tablet Take 1 tablet (81 mg total) by mouth daily. Swallow whole.   Baclofen 5 MG Tabs Take 5 mg by mouth 3 (three) times daily as needed. What changed: reasons to take this   celecoxib 200 MG capsule Commonly known as: CELEBREX Take 200 mg by mouth daily.   citalopram 10 MG tablet Commonly known as: CELEXA Take 1 tablet (10 mg total) by mouth daily. What changed: when to take this   ezetimibe 10 MG tablet Commonly known as: ZETIA Take 1 tablet (10 mg total) by mouth every evening.   fluticasone 50 MCG/ACT nasal spray Commonly known as: FLONASE Place 1 spray into both  nostrils daily as needed for allergies.   furosemide 20 MG tablet Commonly known as: LASIX Take 1 tablet (20 mg total) by mouth daily.   gabapentin 600 MG tablet Commonly known as: NEURONTIN Take 1 tablet (600 mg total) by mouth 3 (three) times daily.   gabapentin 400 MG capsule Commonly known as: NEURONTIN Take 400 mg by mouth at bedtime.   HYDROcodone-acetaminophen 7.5-325 MG tablet Commonly known as: NORCO Take 1 tablet by mouth every 8 (eight) hours as needed for moderate pain.   loratadine 10 MG tablet Commonly known as: CLARITIN Take 10 mg by mouth daily in the afternoon.   LORazepam 1 MG tablet Commonly known as: ATIVAN Take 1 tablet (1 mg total) by mouth every 8 (eight) hours as needed for anxiety. What changed: when to take this   Magnesium 250 MG Tabs Take 250 mg by mouth daily in the afternoon.   metoprolol succinate 25 MG 24 hr tablet Commonly known as: TOPROL-XL Take 1 tablet by mouth daily.   MULTIPLE VITAMIN PO Take 1 tablet by mouth daily.   omeprazole 20 MG capsule Commonly known as: PRILOSEC Take 20 mg by mouth daily.   pravastatin 20 MG tablet Commonly known as: PRAVACHOL Take 1 tablet (20 mg total) by  mouth daily at 6 PM.   saccharomyces boulardii 250 MG capsule Commonly known as: FLORASTOR Take 250 mg by mouth daily in the afternoon.   senna-docusate 8.6-50 MG tablet Commonly known as: Senokot-S Take 1 tablet by mouth 2 (two) times daily.   tamsulosin 0.4 MG Caps capsule Commonly known as: FLOMAX Take 1 capsule (0.4 mg total) by mouth daily.   triamcinolone cream 0.1 % Commonly known as: KENALOG Apply 1 application topically daily as needed (irritation).       Disposition: home   Final Dx: SCS  Discharge Instructions     Remove dressing in 72 hours   Complete by: As directed    Call MD for:  persistant nausea and vomiting   Complete by: As directed    Call MD for:  redness, tenderness, or signs of infection (pain, swelling, redness, odor or green/yellow discharge around incision site)   Complete by: As directed    Call MD for:  severe uncontrolled pain   Complete by: As directed    Call MD for:  temperature >100.4   Complete by: As directed    Diet - low sodium heart healthy   Complete by: As directed    Increase activity slowly   Complete by: As directed        Follow-up Information    Eustace Moore, MD Follow up.   Specialty: Neurosurgery Contact information: [address] [place] [address] [place] [address] [phone]                Signed: Eustace Moore 07/10/2020, 2:01 PM

## 2020-07-10 NOTE — Op Note (Signed)
**Note Ruben-Identified via Obfuscation** 07/10/2020  10:36 AM  PATIENT:  Ruben Barrera  87 y.o. male  PRE-OPERATIVE DIAGNOSIS: Failed back syndrome, thoracic spondylosis T8-9  POST-OPERATIVE DIAGNOSIS:  same  PROCEDURE: Insertion of permanent spinal cord stimulator via T9 laminectomy, with placement of [employer] scientific pulse generator, and laminectomy, medial facetectomy T8-9 for thoracic spondylosis  SURGEON:  Sherley Bounds, MD  ASSISTANTS: Margo Aye FNP  ANESTHESIA:   General  EBL: 25 ml  Total I/O In: 1000 [I.V.:1000] Out: 25 [Blood:25]  BLOOD ADMINISTERED: none  DRAINS: None  SPECIMEN:  none  INDICATION FOR PROCEDURE: This patient presented with back and leg pain after previous spine surgeries.. Imaging showed facet spondylosis at T8-9 and T9-10 and spinal stenosis L3-4 adjacent to his previous fusion. The patient tried conservative measures without relief. Pain was debilitating.  He actually did great with a spinal cord stimulator trial with complete resolution of his pain.  We talked about lumbar decompression and instrumented fusion at L3-4 versus placement of the spinal cord stimulator.  Recommended placement of the spinal cord stimulator. Patient understood the risks, benefits, and alternatives and potential outcomes and wished to proceed.  PROCEDURE DETAILS: The patient was taken to the operating room and after induction of adequate generalized tracheal anesthesia the patient was rolled onto the chest rolls in the prone position. All pressure points were padded. The thoracic and lumbar region was cleaned and then prepped with Betadine scrub and DuraPrep in the usual sterile fashion utilizing Betadine scrub and DuraPrep. Local anesthesia was injected and a dorsal midline incision was made over the T9-10 region and carried down to the thoracic fascia. The fascia was opened and the paraspinous musculature was taken down in a subcutaneous periosteal fashion to expose T8-9 and T9-10. Intraoperative fluoroscopy confirmed  my level and then the spinous process was removed and a laminectomy was performed at T8-9 and T9-10 to expose the underlying dura.  At both levels I performed a medial facetectomy to further decompress the canal because of spondylosis at both levels.  I undercut the laminotomy as well as the lateral recess.  The canal appears to be well decompressed at both levels.  I then was able to pass our trial easily through the epidural space to the level of T8. This was done and checked with AP fluoroscopy. I then was able to pass my paddle lead to the same level utilizing AP fluoroscopy.  The top of the paddle lead reached the top of T8.  The leads were then tacked down to fascia with a 2-0 silk suture. An incision was made in the left flank and a pocket was created for the battery. I then used a passer to pass the leads to the battery pocket. The leads were then placed into the battery and impedance was checked after placing the battery partially in the pocket. The leads were then tightened into position. The battery was then placed in the pocket with the leads behind the battery and the writing up. We checked our final placement with AP fluoroscopy. I was then able to irrigate with copious amounts of bacitracin containing saline solution.  Placed powdered vancomycin into the wounds I then closed the fascia of both incisions with 0 Vicryl. The subcutaneous tissues were closed with 2-0 Vicryl in the subcuticular tissue was closed with 3-0 Vicryl. The skin was closed with staples. A sterile dressing was then applied. The patient was then awakened from general anesthesia and transported to the recovery room in stable condition. At the end of  the procedure all sponge needle and instrument counts were correct.   PLAN OF CARE: Admit for overnight observation  PATIENT DISPOSITION:  PACU - hemodynamically stable.   Delay start of Pharmacological VTE agent (>24hrs) due to surgical blood loss or risk of bleeding:   yes

## 2020-07-10 NOTE — H&P (Signed)
Subjective: Patient is a 87 y.o. male admitted for failed back syndrome. Onset of symptoms was several months ago, gradually worsening since that time.  The pain is rated severe, and is located at the across the lower back and radiates to legs. The pain is described as aching and occurs all day. The symptoms have been progressive. Symptoms are exacerbated by exercise and standing. MRI or CT showed stenosis Ls-pine. Did great with scs trial with almost complete resolution of pain.   Past Medical History:  Diagnosis Date  . Arthritis   . Coronary artery disease   . Depression   . Dysrhythmia    "extra heart beat"  . GERD (gastroesophageal reflux disease)   . History of hiatal hernia   . Hypercholesteremia   . Hypertension   . Lumbar spinal stenosis   . RLS (restless legs syndrome)    takes ativan as needed  . Stroke [facility]) 01/2020   minor - no deficits    Past Surgical History:  Procedure Laterality Date  . BACK SURGERY    . COLONOSCOPY    . EYE SURGERY Bilateral    cataracts removed   . LUMBAR LAMINECTOMY/DECOMPRESSION MICRODISCECTOMY N/A 08/28/2015   Procedure: Lumbar Four-Five decompressive lumbar laminectomy;  Surgeon: Jovita Gamma, MD;  Location: [place] NEURO ORS;  Service: Neurosurgery;  Laterality: N/A;  . REPLACEMENT TOTAL KNEE BILATERAL Bilateral 11/05/2003  . RIGHT/LEFT HEART CATH AND CORONARY ANGIOGRAPHY N/A 03/28/2020   Procedure: RIGHT/LEFT HEART CATH AND CORONARY ANGIOGRAPHY;  Surgeon: Burnell Blanks, MD;  Location: [place] CV LAB;  Service: Cardiovascular;  Laterality: N/A;  . TOE AMPUTATION Right 2010   second toe  . TONSILLECTOMY      Prior to Admission medications   Medication Sig Start Date End Date Taking? Authorizing Provider  aspirin EC 81 MG EC tablet Take 1 tablet (81 mg total) by mouth daily. Swallow whole. 02/22/20  Yes Hosie Poisson, MD  Baclofen 5 MG TABS Take 5 mg by mouth 3 (three) times daily as needed. Patient taking differently: Take 5 mg by  mouth 3 (three) times daily as needed (muscle spasms). 04/04/20  Yes Raulkar, Clide Deutscher, MD  celecoxib (CELEBREX) 200 MG capsule Take 200 mg by mouth daily. 03/15/20  Yes [provider]  citalopram (CELEXA) 10 MG tablet Take 1 tablet (10 mg total) by mouth daily. Patient taking differently: Take 10 mg by mouth at bedtime. 02/26/20  Yes Angiulli, Lavon Paganini, PA-C  ezetimibe (ZETIA) 10 MG tablet Take 1 tablet (10 mg total) by mouth every evening. 02/26/20  Yes Angiulli, Lavon Paganini, PA-C  furosemide (LASIX) 20 MG tablet Take 1 tablet (20 mg total) by mouth daily. 03/21/20  Yes Chandrasekhar, Mahesh A, MD  gabapentin (NEURONTIN) 400 MG capsule Take 400 mg by mouth at bedtime. 03/15/20  Yes [provider]  gabapentin (NEURONTIN) 600 MG tablet Take 1 tablet (600 mg total) by mouth 3 (three) times daily. 02/26/20  Yes Angiulli, Lavon Paganini, PA-C  HYDROcodone-acetaminophen (NORCO) 7.5-325 MG tablet Take 1 tablet by mouth every 8 (eight) hours as needed for moderate pain. 02/26/20  Yes Angiulli, Lavon Paganini, PA-C  loratadine (CLARITIN) 10 MG tablet Take 10 mg by mouth daily in the afternoon.   Yes [provider]  LORazepam (ATIVAN) 1 MG tablet Take 1 tablet (1 mg total) by mouth every 8 (eight) hours as needed for anxiety. Patient taking differently: Take 1 mg by mouth at bedtime. 02/26/20  Yes Angiulli, Lavon Paganini, PA-C  Magnesium 250 MG  TABS Take 250 mg by mouth daily in the afternoon.   Yes [provider]  metoprolol succinate (TOPROL-XL) 25 MG 24 hr tablet Take 1 tablet by mouth daily. 03/27/20  Yes Chandrasekhar, Mahesh A, MD  MULTIPLE VITAMIN PO Take 1 tablet by mouth daily.    Yes [provider]  omeprazole (PRILOSEC) 20 MG capsule Take 20 mg by mouth daily.   Yes [provider]  pravastatin (PRAVACHOL) 20 MG tablet Take 1 tablet (20 mg total) by mouth daily at 6 PM. 02/26/20  Yes Angiulli, Lavon Paganini, PA-C  saccharomyces boulardii (FLORASTOR) 250 MG capsule Take  250 mg by mouth daily in the afternoon. 05/06/18  Yes [provider]  senna-docusate (SENOKOT-S) 8.6-50 MG tablet Take 1 tablet by mouth 2 (two) times daily. 02/26/20  Yes Angiulli, Lavon Paganini, PA-C  tamsulosin (FLOMAX) 0.4 MG CAPS capsule Take 1 capsule (0.4 mg total) by mouth daily. 02/26/20  Yes Angiulli, Lavon Paganini, PA-C  acetaminophen (TYLENOL) 325 MG tablet Take 2 tablets (650 mg total) by mouth every 4 (four) hours as needed for mild pain (or temp > 37.5 C (99.5 F)). Patient not taking: Reported on 06/28/2020 02/26/20   Angiulli, Lavon Paganini, PA-C  fluticasone [facility]) 50 MCG/ACT nasal spray Place 1 spray into both nostrils daily as needed for allergies.     [provider]  triamcinolone (KENALOG) 0.1 % Apply 1 application topically daily as needed (irritation).    [provider]   Allergies  Allergen Reactions  . Simvastatin Other (See Comments)    Leg pains     Social History   Tobacco Use  . Smoking status: Current Some Day Smoker    Types: Cigars  . Smokeless tobacco: Never Used  . Tobacco comment: quit 1972- currently 1 cigar per week  Substance Use Topics  . Alcohol use: Yes    Comment: occasionally beer/wine or scotch    Family History  Problem Relation Age of Onset  . Stroke Mother   . Hypertension Other        family history     Review of Systems  Positive ROS: neg  All other systems have been reviewed and were otherwise negative with the exception of those mentioned in the HPI and as above.  Objective: Vital signs in last 24 hours: Temp:  [97.6 F (36.4 C)] 97.6 F (36.4 C) (04/20 0644) Pulse Rate:  [62] 62 (04/20 0644) Resp:  [18] 18 (04/20 0644) BP: (183)/(92) 183/92 (04/20 0644) SpO2:  [97 %] 97 % (04/20 0644) Weight:  [82.6 kg] 82.6 kg (04/20 0644)  General Appearance: Alert, cooperative, no distress, appears stated age Head: Normocephalic, without obvious abnormality, atraumatic Eyes: PERRL, conjunctiva/corneas clear, EOM's  intact    Neck: Supple, symmetrical, trachea midline Back: Symmetric, no curvature, ROM normal, no CVA tenderness Lungs:  respirations unlabored Heart: Regular rate and rhythm Abdomen: Soft, non-tender Extremities: Extremities normal, atraumatic, no cyanosis or edema Pulses: 2+ and symmetric all extremities Skin: Skin color, texture, turgor normal, no rashes or lesions  NEUROLOGIC:   Mental status: Alert and oriented x4,  no aphasia, good attention span, fund of knowledge, and memory Motor Exam - grossly normal Sensory Exam - grossly normal Reflexes: 1+ Coordination - grossly normal Gait - grossly normal Balance - grossly normal Cranial Nerves: I: smell Not tested  II: visual acuity  OS: nl    OD: nl  II: visual fields Full to confrontation  II: pupils Equal, round, reactive to light  III,VII:  ptosis None  III,IV,VI: extraocular muscles  Full ROM  V: mastication Normal  V: facial light touch sensation  Normal  V,VII: corneal reflex  Present  VII: facial muscle function - upper  Normal  VII: facial muscle function - lower Normal  VIII: hearing Not tested  IX: soft palate elevation  Normal  IX,X: gag reflex Present  XI: trapezius strength  5/5  XI: sternocleidomastoid strength 5/5  XI: neck flexion strength  5/5  XII: tongue strength  Normal    Data Review Lab Results  Component Value Date   WBC 6.7 07/10/2020   HGB 15.3 07/10/2020   HCT 46.9 07/10/2020   MCV 96.5 07/10/2020   PLT 225 07/10/2020   Lab Results  Component Value Date   NA 138 07/10/2020   K 4.2 07/10/2020   CL 104 07/10/2020   CO2 27 07/10/2020   BUN 26 (H) 07/10/2020   CREATININE 1.11 07/10/2020   GLUCOSE 110 (H) 07/10/2020   Lab Results  Component Value Date   INR 1.1 07/10/2020    Assessment/Plan:  Estimated body mass index is 29.39 kg/m as calculated from the following:   Height as of this encounter: 5\' 6"  (1.676 m).   Weight as of this encounter: 82.6 kg. Patient admitted for SCS  via T8 and T9 laminectomy. He has spondylosis at [facility]. Patient has failed a reasonable attempt at conservative therapy.  I explained the condition and procedure to the patient and answered any questions.  Patient wishes to proceed with procedure as planned. Understands risks/ benefits and typical outcomes of procedure.   Eustace Moore 07/10/2020 8:26 AM

## 2020-07-10 NOTE — Transfer of Care (Signed)
Immediate Anesthesia Transfer of Care Note  Patient: Ruben Barrera  Procedure(s) Performed: Spinal cord stimulator via  - Thoracic Eight-Thoracic Nine, Thoracic Nine-Thoracic Ten Laminectomy (N/A Back)  Patient Location: PACU  Anesthesia Type:General  Level of Consciousness: drowsy, patient cooperative and responds to stimulation  Airway & Oxygen Therapy: Patient Spontanous Breathing and Patient connected to nasal cannula oxygen  Post-op Assessment: Report given to RN, Post -op Vital signs reviewed and stable and Patient moving all extremities X 4  Post vital signs: Reviewed and stable  Last Vitals:  Vitals Value Taken Time  BP 173/114 07/10/20 1050  Temp    Pulse    Resp 13 07/10/20 1056  SpO2    Vitals shown include unvalidated device data.  Last Pain:  Vitals:   07/10/20 0709  TempSrc:   PainSc: 0-No pain         Complications: No complications documented.

## 2020-07-10 NOTE — Anesthesia Procedure Notes (Signed)
Arterial Line Insertion Start/End4/20/2022 7:45 AM, 07/10/2020 7:48 AM Performed by: Claris Che, CRNA  Preanesthetic checklist: patient identified, IV checked, monitors and equipment checked, pre-op evaluation and timeout performed Lidocaine 1% used for infiltration Left, radial was placed Catheter size: 20 G Hand hygiene performed  and maximum sterile barriers used   Attempts: 1 Procedure performed without using ultrasound guided technique. Following insertion, dressing applied and Biopatch. Post procedure assessment: normal  Patient tolerated the procedure well with no immediate complications.

## 2020-07-10 NOTE — Plan of Care (Signed)
Patient discharged home with spouse. Patient voiding adequately with no c/o pain or discomfort. Patient discharged home, with stated understanding of instructions given. Patient has an appointment with Dr. Ronnald Ramp in 2 weeks

## 2020-07-11 ENCOUNTER — Encounter (HOSPITAL_COMMUNITY): Payer: Self-pay | Admitting: Neurological Surgery

## 2020-07-18 ENCOUNTER — Encounter (HOSPITAL_COMMUNITY): Payer: Self-pay | Admitting: Neurological Surgery

## 2020-07-22 ENCOUNTER — Encounter: Payer: Self-pay | Admitting: Internal Medicine

## 2020-07-22 ENCOUNTER — Ambulatory Visit (INDEPENDENT_AMBULATORY_CARE_PROVIDER_SITE_OTHER): Payer: PPO | Admitting: Internal Medicine

## 2020-07-22 ENCOUNTER — Other Ambulatory Visit: Payer: Self-pay

## 2020-07-22 VITALS — BP 110/60 | HR 63 | Ht 68.0 in | Wt 182.0 lb

## 2020-07-22 DIAGNOSIS — I471 Supraventricular tachycardia: Secondary | ICD-10-CM

## 2020-07-22 DIAGNOSIS — E782 Mixed hyperlipidemia: Secondary | ICD-10-CM | POA: Diagnosis not present

## 2020-07-22 DIAGNOSIS — I350 Nonrheumatic aortic (valve) stenosis: Secondary | ICD-10-CM | POA: Diagnosis not present

## 2020-07-22 DIAGNOSIS — I5020 Unspecified systolic (congestive) heart failure: Secondary | ICD-10-CM | POA: Diagnosis not present

## 2020-07-22 DIAGNOSIS — I493 Ventricular premature depolarization: Secondary | ICD-10-CM

## 2020-07-22 NOTE — Patient Instructions (Signed)
Medication Instructions:  Your physician recommends that you continue on your current medications as directed. Please refer to the Current Medication list given to you today.  *If you need a refill on your cardiac medications before your next appointment, please call your pharmacy*   Lab Work: NONE If you have labs (blood work) drawn today and your tests are completely normal, you will receive your results only by: Marland Kitchen MyChart Message (if you have MyChart) OR . A paper copy in the mail If you have any lab test that is abnormal or we need to change your treatment, we will call you to review the results.   Testing/Procedures: Your physician has requested that you have an echocardiogram in September. Echocardiography is a painless test that uses sound waves to create images of your heart. It provides your doctor with information about the size and shape of your heart and how well your heart's chambers and valves are working. This procedure takes approximately one hour. There are no restrictions for this procedure.     Follow-Up: At [facility], you and your health needs are our priority.  As part of our continuing mission to provide you with exceptional heart care, we have created designated Provider Care Teams.  These Care Teams include your primary Cardiologist (physician) and Advanced Practice Providers (APPs -  Physician Assistants and Nurse Practitioners) who all work together to provide you with the care you need, when you need it.   Your next appointment:   7 month(s)  The format for your next appointment:   In Person  Provider:   You may see Werner Lean, MD or one of the following Advanced Practice Providers on your designated Care Team:    Melina Copa, PA-C  Ermalinda Barrios, PA-C

## 2020-07-22 NOTE — Progress Notes (Signed)
**Note Ruben-Identified via Obfuscation** Cardiology Office Note:    Date:  07/22/2020   ID:  Ruben Barrera, DOB 07-26-1932, MRN [id number]  PCP:  Lajean Manes, MD  [facility] Cardiologist:  Werner Lean, MD  [place] Electrophysiologist:  None   CC: HF follow up  History of Present Illness:    Ruben Barrera is a 87 y.o. male with a hx of HTN, HLD, Non-obstructive CAD without intervention, HFrEF,  LFLG-AS, right CVA stroke, frequent PVCs and SVT, and Tobacco Abuse (occasional cigar) who presented for evaluation 04/19/20.  In interval had LHC 03/28/20 with diagnosis of CAD and AS.   In interim of this visit, patient EP eval- no plans for more aggressive NSVT therapy; per EP and given age no plans for ICD.  Had back surgery 07/10/20.  I had also discuss his MRA with Dr. Felipa Eth (we DC/ed this medication).  Patient notes that he had no issues with surgery.  Gets his unit started tomorrow and is hoping his back pain will improve.  No chest pain or pressure .  No SOB/DOE and no PND/Orthopnea.  No weight gain or leg swelling.  No palpitations or syncope .   Past Medical History:  Diagnosis Date  . Arthritis   . Coronary artery disease   . Depression   . Dysrhythmia    "extra heart beat"  . GERD (gastroesophageal reflux disease)   . History of hiatal hernia   . Hypercholesteremia   . Hypertension   . Lumbar spinal stenosis   . RLS (restless legs syndrome)    takes ativan as needed  . Stroke [facility]) 01/2020   minor - no deficits    Past Surgical History:  Procedure Laterality Date  . BACK SURGERY    . COLONOSCOPY    . EYE SURGERY Bilateral    cataracts removed   . LUMBAR LAMINECTOMY/DECOMPRESSION MICRODISCECTOMY N/A 08/28/2015   Procedure: Lumbar Four-Five decompressive lumbar laminectomy;  Surgeon: Jovita Gamma, MD;  Location: [place] [facility];  Service: Neurosurgery;  Laterality: N/A;  . REPLACEMENT TOTAL KNEE BILATERAL Bilateral 11/05/2003  . RIGHT/LEFT HEART CATH AND CORONARY ANGIOGRAPHY N/A 03/28/2020    Procedure: RIGHT/LEFT HEART CATH AND CORONARY ANGIOGRAPHY;  Surgeon: Burnell Blanks, MD;  Location: [place] CV LAB;  Service: Cardiovascular;  Laterality: N/A;  . THORACIC DISCECTOMY N/A 07/10/2020   Procedure: Spinal cord stimulator via  - Thoracic Eight-Thoracic Nine, Thoracic Nine-Thoracic Ten Laminectomy;  Surgeon: Eustace Moore, MD;  Location: [place];  Service: Neurosurgery;  Laterality: N/A;  3C  . TOE AMPUTATION Right 2010   second toe  . TONSILLECTOMY      Current Medications: Current Meds  Medication Sig  . acetaminophen (TYLENOL) 325 MG tablet Take 2 tablets (650 mg total) by mouth every 4 (four) hours as needed for mild pain (or temp > 37.5 C (99.5 F)).  Marland Kitchen aspirin EC 81 MG EC tablet Take 1 tablet (81 mg total) by mouth daily. Swallow whole.  . Baclofen 5 MG TABS Take 5 mg by mouth 3 (three) times daily as needed.  . celecoxib (CELEBREX) 200 MG capsule Take 200 mg by mouth daily.  . citalopram (CELEXA) 10 MG tablet Take 1 tablet (10 mg total) by mouth daily.  Marland Kitchen ezetimibe (ZETIA) 10 MG tablet Take 1 tablet (10 mg total) by mouth every evening.  . fluticasone (FLONASE) 50 MCG/ACT nasal spray Place 1 spray into both nostrils daily as needed for allergies.   . furosemide (LASIX) 20 MG tablet Take 1 tablet (  20 mg total) by mouth daily.  Marland Kitchen gabapentin (NEURONTIN) 400 MG capsule Take 400 mg by mouth at bedtime.  . gabapentin (NEURONTIN) 600 MG tablet Take 1 tablet (600 mg total) by mouth 3 (three) times daily.  Marland Kitchen HYDROcodone-acetaminophen (NORCO) 7.5-325 MG tablet Take 1 tablet by mouth every 8 (eight) hours as needed for moderate pain.  Marland Kitchen loratadine (CLARITIN) 10 MG tablet Take 10 mg by mouth daily as needed.  Marland Kitchen LORazepam (ATIVAN) 1 MG tablet Take 1 tablet (1 mg total) by mouth every 8 (eight) hours as needed for anxiety.  . Magnesium 250 MG TABS Take 250 mg by mouth daily in the afternoon.  . metoprolol succinate (TOPROL-XL) 25 MG 24 hr tablet Take 1 tablet by mouth daily.   . MULTIPLE VITAMIN PO Take 1 tablet by mouth daily.   Marland Kitchen omeprazole (PRILOSEC) 20 MG capsule Take 20 mg by mouth daily.  . pravastatin (PRAVACHOL) 20 MG tablet Take 1 tablet (20 mg total) by mouth daily at 6 PM.  . saccharomyces boulardii (FLORASTOR) 250 MG capsule Take 250 mg by mouth daily in the afternoon.  . senna-docusate (SENOKOT-S) 8.6-50 MG tablet Take 1 tablet by mouth 2 (two) times daily. (Patient taking differently: Take 2 tablets by mouth daily.)  . tamsulosin (FLOMAX) 0.4 MG CAPS capsule Take 1 capsule (0.4 mg total) by mouth daily.  Marland Kitchen triamcinolone (KENALOG) 0.1 % Apply 1 application topically daily as needed (irritation).     Allergies:   Simvastatin and Benazepril hcl   Social History   Socioeconomic History  . Marital status: Married    Spouse name: Not on file  . Number of children: Not on file  . Years of education: Not on file  . Highest education level: Not on file  Occupational History  . Not on file  Tobacco Use  . Smoking status: Current Some Day Smoker    Types: Cigars  . Smokeless tobacco: Never Used  . Tobacco comment: quit 1972- currently 1 cigar per week  Vaping Use  . Vaping Use: Never used  Substance and Sexual Activity  . Alcohol use: Yes    Comment: occasionally beer/wine or scotch  . Drug use: No    Comment: 1 cigar per week  . Sexual activity: Not Currently  Other Topics Concern  . Not on file  Social History Narrative   Right handed   Lives with wife one story home   Social Determinants of Health   Financial Resource Strain: Not on file  Food Insecurity: Not on file  Transportation Needs: Not on file  Physical Activity: Not on file  Stress: Not on file  Social Connections: Not on file    Social: Wife Comes to visit and takes notes for him, Former Teacher, English as a foreign language of [employer]  Family History: The patient's family history includes Hypertension in an other family member; Stroke in his mother.  No history of congestive heart  failure  ROS:   Please see the history of present illness.    All other systems reviewed and are negative.  EKGs/Labs/Other Studies Reviewed:    The following studies were reviewed today:  EKG:   02/21/20:  Sinus Rhythm Rate 84 with Rare PVCs, and PACs, QTc 514 ms  Cardiac Event Monitoring: Date 03/25/2020 Personally reviewed Results:   Patient had a minimum heart rate of 44 bpm, maximum heart rate of 171 bpm, and average heart rate of 68 bpm.  Predominant underlying rhythm was sinus rhythm.  Three runs of ventricular tachycardia  occurred lasting 11 beats at longest with a max rate of 143 bpm.  Eighty-seven runs of supraventricular tachycardia occurred, lasting 17.1 seconds at longest with a max rate of 171 bpm at fastest.  Isolated PACs were occasional (4.2%), with rare couplets and triplets present.  Isolated PVCs were frequent (5.1%), with rare couplets, triplets, bigeminy, and trigeminy present.  No evidence of complete heart block.  No triggered and diary events.   Frequent PVCs, SVT, with runs of NSVT.   Transthoracic Echocardiogram: Date:02/21/20 Results: 1. Left ventricular ejection fraction, by estimation, is 30 to 35%. The  left ventricle has moderately decreased function. The left ventricle  demonstrates global hypokinesis. The left ventricular internal cavity size  was mildly dilated. There is severe  concentric left ventricular hypertrophy. Left ventricular diastolic  parameters are indeterminate.  2. Right ventricular systolic function is normal. The right ventricular  size is normal.  3. Left atrial size was severely dilated.  4. The mitral valve is grossly normal. Mild mitral valve regurgitation.  5. The aortic valve is calcified. Aortic valve regurgitation is not  visualized. Mild to moderate aortic valve stenosis. Aortic valve mean  gradient measures 25.0 mmHg.   FINDINGS  Left Ventricle: LVMI 197 g/m2 RWT 0.54. Left ventricular ejection  fraction, by estimation, is 30 to 35%.  The left ventricle has moderately decreased function. The left ventricle  demonstrates global hypokinesis. The left ventricular internal cavity size  was mildly dilated. There is severe concentric left ventricular hypertrophy. Left ventricular diastolic parameters are indeterminate.   NM Stress Testing : Date: 02/21/2020 Results:  Notes Recorded by Dorothy Spark, MD on 05/10/2014 at 2:50 PM Normal stress test  Left/Right Heart Catheterizations: Date: 03/28/2020 Results:  Prox Cx lesion is 30% stenosed.  Prox LAD to Mid LAD lesion is 60% stenosed.  Dist LAD lesion is 60% stenosed.   1. The LAD is a large vessel that courses to the apex. Moderate, heavily calcified proximal to mid stenosis that does is eccentric and does not appear to flow limiting. Focal mid to distal moderate stenosis.  2. Moderate disease in the intermediate branch 3. Mild plaque in the Circumflex 4. Large, dominant RCA with no obstructive disease 5. Low flow/low gradient moderate to severe aortic stenosis (Cath data: Mean gradient 16.38mmHg, peak to peak gradient 16 mmHg). By echo his dimensionless index is 0.28 and SVI is 40.   Recommendations: He appears to have low flow/low gradient moderate to severe aortic stenosis. He has moderate CAD but in the absence of angina, would not treat the calcific disease in the proximal and mid LAD. He is asymptomatic at this time.    Recent Labs: 02/23/2020: ALT 22; TSH 0.757 03/19/2020: Magnesium 2.4; NT-Pro BNP 1,910 07/10/2020: BUN 26; Creatinine, Ser 1.11; Hemoglobin 15.3; Platelets 225; Potassium 4.2; Sodium 138  Recent Lipid Panel    Component Value Date/Time   CHOL 204 (H) 02/21/2020 0253   TRIG 199 (H) 02/21/2020 0253   HDL 42 02/21/2020 0253   CHOLHDL 4.9 02/21/2020 0253   VLDL 40 02/21/2020 0253   LDLCALC 122 (H) 02/21/2020 0253    Risk Assessment/Calculations:     N/A  Physical Exam:    VS:  BP 110/60   Pulse 63    Ht 5\' 8"  (1.727 m)   Wt 182 lb (82.6 kg)   SpO2 96%   BMI 27.67 kg/m     Wt Readings from Last 3 Encounters:  07/22/20 182 lb (82.6 kg)  07/10/20 182 lb 1.6  oz (82.6 kg)  05/14/20 186 lb 3.2 oz (84.5 kg)    GEN: Well nourished, well developed in no acute distress HEENT: Normal NECK: No JVD; No carotid bruits LYMPHATICS: No lymphadenopathy CARDIAC: RRR, III/VI systolic crescendo murmur without rubs or gallops, late S2, +1 radial pulse (no irregular beats) RESPIRATORY:  Clear to auscultation without rales, wheezing or rhonchi  ABDOMEN: Soft, non-tender, non-distended MUSCULOSKELETAL:  No edema; No deformity  SKIN: Warm and dry NEUROLOGIC:  Alert and oriented x 3 PSYCHIATRIC:  Normal affect   ASSESSMENT:    1. Nonrheumatic aortic valve stenosis   2. HFrEF (heart failure with reduced ejection fraction) ([place])   3. SVT (supraventricular tachycardia) ([place])   4. Mixed hyperlipidemia   5. Frequent PVCs    PLAN:    In order of problems listed above:  Heart Failure Reduced Ejection Fraction  Low Flow Low Gradient  Moderate to Severe  - NYHA class I, Stage B, euvolemic, etiology likely multifocal; CAD may be a component, also had frequent PVCs  - Diuretic regimen: lasix 20 mg PO daily - Strict I/Os, daily weights, and fluid restriction of < 2 L  - continue 12.5 mg metoprolol XL - ARB related hypotension (query symptomatic) stopped; worry about ARNI challenge - MRA stopped by PCP in the past - At next visit will offer SGTL2i - Echo cardiogram in Fall and Winter follow up; discussed AS, HF, and CAD symptoms; patient notes interest in future TAVR  Coronary Artery Disease; Nonobstructive HLD - asymptomatic - anatomy: Prox LAD 60%, mid LAD 60%, prox Cir 30% - continue ASA 81 mg -goal LDL < 70 (multiple statin intolerances in the past) -continue Zetia and pravastatin - continue BB - gave education on dietary changes  NSVT and Frequent PVCs - AV Nodal Therapy: BB as above -  saw EP  Tobacco Abuse-> Down to two cigars a week (Pre-Contemplative for further cessation) - discussed the dangers of tobacco use, both inhaled and oral, which include, but are not limited to cardiovascular disease, increased cancer risk of multiple types of cancer, COPD, peripheral arterial disease, strokes. - counseled on the benefits of smoking cessation. - firmly advised to quit.  - we also reviewed strategies to maximize success, including:  Removing cigarettes and smoking materials from environment   Stress management  Substitution of other forms of reinforcement (the one cigarette a day approach)  Support of family/friends and group smoking cessation  Selecting a quit date  Patient provided contact information for [employer] or 1-800-QUIT-NOW  Patient provided with [place]'s 8 free smoking cessation classes: (336) [phone] and [employer]fr    Winter follow up unless new symptoms or abnormal test results warranting change in plan Would be reasonable for  APP Follow up   Medication Adjustments/Labs and Tests Ordered: Current medicines are reviewed at length with the patient today.  Concerns regarding medicines are outlined above.  Orders Placed This Encounter  Procedures  . ECHOCARDIOGRAM COMPLETE   No orders of the defined types were placed in this encounter.   Patient Instructions  Medication Instructions:  Your physician recommends that you continue on your current medications as directed. Please refer to the Current Medication list given to you today.  *If you need a refill on your cardiac medications before your next appointment, please call your pharmacy*   Lab Work: NONE If you have labs (blood work) drawn today and your tests are completely normal, you will receive your results only by: Marland Kitchen MyChart Message (if you have MyChart)  OR . A paper copy in the mail If you have any lab test that is abnormal or we need to change your treatment,  we will call you to review the results.   Testing/Procedures: Your physician has requested that you have an echocardiogram in September. Echocardiography is a painless test that uses sound waves to create images of your heart. It provides your doctor with information about the size and shape of your heart and how well your heart's chambers and valves are working. This procedure takes approximately one hour. There are no restrictions for this procedure.     Follow-Up: At [facility], you and your health needs are our priority.  As part of our continuing mission to provide you with exceptional heart care, we have created designated Provider Care Teams.  These Care Teams include your primary Cardiologist (physician) and Advanced Practice Providers (APPs -  Physician Assistants and Nurse Practitioners) who all work together to provide you with the care you need, when you need it.   Your next appointment:   7 month(s)  The format for your next appointment:   In Person  Provider:   You may see Werner Lean, MD or one of the following Advanced Practice Providers on your designated Care Team:    Melina Copa, PA-C  Ermalinda Barrios, PA-C          Signed, Werner Lean, MD  07/22/2020 1:50 PM    [place]

## 2020-07-30 DIAGNOSIS — Z961 Presence of intraocular lens: Secondary | ICD-10-CM | POA: Diagnosis not present

## 2020-07-30 DIAGNOSIS — H52203 Unspecified astigmatism, bilateral: Secondary | ICD-10-CM | POA: Diagnosis not present

## 2020-07-30 DIAGNOSIS — H43813 Vitreous degeneration, bilateral: Secondary | ICD-10-CM | POA: Diagnosis not present

## 2020-07-30 DIAGNOSIS — H35371 Puckering of macula, right eye: Secondary | ICD-10-CM | POA: Diagnosis not present

## 2020-08-01 DIAGNOSIS — M48062 Spinal stenosis, lumbar region with neurogenic claudication: Secondary | ICD-10-CM | POA: Diagnosis not present

## 2020-08-01 DIAGNOSIS — M961 Postlaminectomy syndrome, not elsewhere classified: Secondary | ICD-10-CM | POA: Diagnosis not present

## 2020-08-01 DIAGNOSIS — Z9689 Presence of other specified functional implants: Secondary | ICD-10-CM | POA: Diagnosis not present

## 2020-08-26 DIAGNOSIS — Z23 Encounter for immunization: Secondary | ICD-10-CM | POA: Diagnosis not present

## 2020-08-26 DIAGNOSIS — I10 Essential (primary) hypertension: Secondary | ICD-10-CM | POA: Diagnosis not present

## 2020-09-13 DIAGNOSIS — R110 Nausea: Secondary | ICD-10-CM | POA: Diagnosis not present

## 2020-09-19 NOTE — Progress Notes (Signed)
NEUROLOGY FOLLOW UP OFFICE NOTE  [name] [id number]  Assessment/Plan:   Right cerebellar peduncle infarct, most likely secondary to small vessel disease Cardiomyopathy/CHF Hyperlipidemia Hypertension Lumbar spinal stenosis Asymptomatic left internal carotid artery stenosis  ASA 81mg  daily 2.  Secondary stroke prevention as managed by PCP/cardiology: -  Prvastatin 20mg .  LDL goal less than 70 -  Normotensive blood pressure. -  Glycemic control.  Hgb A1c goal less than 7 -  Smoking cessation 2. Will check carotid ultrasound in 5 months. 3. Follow up 6 months.  Subjective:  Ruben Barrera is an 87 year old right-handed male with HTN, HLD, cigar smoker, and lumbar stenosis with chronic back pain who follows up for recent stroke.     UPDATE Current medications:  ASA 81mg , Toprol-XL, pravastatin 20mg , Zetia  He had spinal cord stimulator implanted in April.  Still working on setting adjustments to optimize pain control.  Otherwise, doing well.  If he is tired in the evening, he may have difficulty getting out what he is thinking.       HISTORY:   Initially saw him for bilateral lower extremity numbness.  Patient has chronic low back pain and spinal stenosis status post PLIF L4-5 and has had numerous lumbar spine MRIs.  Last MRI on 08/20/2018 personally reviewed showed severe spinal canal and bi foraminal stenosis at L3-4, right foraminal stenosis at L1-2 and PLIF L4-5 without significant stenosis. Following his second COVID vaccine, he developed worsening bilateral sciatic pain and weakness.  He also developed numbness of both feet as well as burning, which is worse when standing on hard surfaces and improved when barefoot or when he is off his feet.  During that time, he exhibited dizziness and slurred speech and went to the ED on 05/24/2019 where MRI of brain was negative for acute infarct and CTA of head and neck showed no hemodynamically significant stenosis or occlusion.  It  was thought to be a reaction to the vaccine.  He had an epidural injection on 05/26/2019 which was ineffective.  He was sent to pain management where he was started on hydrocodone and gabapentin.  He underwent a second injection in early June, which helped the radicular pain but continues to have numbness and burning in the feet.  He reportedly was checked for diabetes and his number was "mildly elevated".  Neuropathy labs from June 2021 included negative ANA, sed rate 5, B12 398, folate >24.8, SPEP/IFE negative for monoclonal protein.  NCV-EMG in August showed chronic bilateral L5-S1 radiculopathy and chronic right L3-4 radiculopathy with no evidence of large fiber sensorimotor polyneuropathy.  He was admitted to [facility] on 02/20/2020 for cerebellar stroke, presenting with recurrent double vision and difficulty writing his name.  CT of head showed hypodensity in the right cerebellar peduncle.  Follow up MRI of brain showed possible subacute rounded infarct in the right cerebellar peduncle.  MRI of brain with contrast demonstrated no abnormal enhancement.  CTA of head and neck showed 70% stenosis at the left ICA origin and high-grade right P2 and left P3 stenosis.  2D echocardiogram showed EF 30-35% with global hypokinesis.  LDL was 122 and Hgb A1c 5.6.  He was started on ASA 81mg  and Plavix 75mg  daily for 3 weeks followed by ASA alone.  Was on Zetia.  Unable to tolerate simvastatin.  In hospital, low-dose pravastatin 20mg  was added.  Cardiology consulted for ischemic workup and outpatient cardiac monitoring to rule out a fib.  Vascular surgery and  cardiology follow up was ordered.  Cardiac event monitor was performed but results still pending.  He has upcoming cardiac cath ordered.  Overall, symptoms have resolved.  During the day, when he is tired, he may slur his speech and feel a little more off-balance.  PAST MEDICAL HISTORY: Past Medical History:  Diagnosis Date   Arthritis    Coronary artery  disease    Depression    Dysrhythmia    "extra heart beat"   GERD (gastroesophageal reflux disease)    History of hiatal hernia    Hypercholesteremia    Hypertension    Lumbar spinal stenosis    RLS (restless legs syndrome)    takes ativan as needed   Stroke ([place]) 01/2020   minor - no deficits    MEDICATIONS: Current Outpatient Medications on File Prior to Visit  Medication Sig Dispense Refill   acetaminophen (TYLENOL) 325 MG tablet Take 2 tablets (650 mg total) by mouth every 4 (four) hours as needed for mild pain (or temp > 37.5 C (99.5 F)).     aspirin EC 81 MG EC tablet Take 1 tablet (81 mg total) by mouth daily. Swallow whole. 30 tablet 11   Baclofen 5 MG TABS Take 5 mg by mouth 3 (three) times daily as needed. 90 tablet 0   celecoxib (CELEBREX) 200 MG capsule Take 200 mg by mouth daily.     citalopram (CELEXA) 10 MG tablet Take 1 tablet (10 mg total) by mouth daily. 30 tablet 0   ezetimibe (ZETIA) 10 MG tablet Take 1 tablet (10 mg total) by mouth every evening. 30 tablet 0   fluticasone (FLONASE) 50 MCG/ACT nasal spray Place 1 spray into both nostrils daily as needed for allergies.      furosemide (LASIX) 20 MG tablet Take 1 tablet (20 mg total) by mouth daily. 90 tablet 3   gabapentin (NEURONTIN) 400 MG capsule Take 400 mg by mouth at bedtime.     gabapentin (NEURONTIN) 600 MG tablet Take 1 tablet (600 mg total) by mouth 3 (three) times daily. 90 tablet 0   HYDROcodone-acetaminophen (NORCO) 7.5-325 MG tablet Take 1 tablet by mouth every 8 (eight) hours as needed for moderate pain. 20 tablet 0   loratadine (CLARITIN) 10 MG tablet Take 10 mg by mouth daily as needed.     LORazepam (ATIVAN) 1 MG tablet Take 1 tablet (1 mg total) by mouth every 8 (eight) hours as needed for anxiety. 10 tablet 0   Magnesium 250 MG TABS Take 250 mg by mouth daily in the afternoon.     metoprolol succinate (TOPROL-XL) 25 MG 24 hr tablet Take 1 tablet by mouth daily.     MULTIPLE VITAMIN PO Take 1  tablet by mouth daily.      omeprazole (PRILOSEC) 20 MG capsule Take 20 mg by mouth daily.     pravastatin (PRAVACHOL) 20 MG tablet Take 1 tablet (20 mg total) by mouth daily at 6 PM. 30 tablet 0   saccharomyces boulardii (FLORASTOR) 250 MG capsule Take 250 mg by mouth daily in the afternoon.     senna-docusate (SENOKOT-S) 8.6-50 MG tablet Take 1 tablet by mouth 2 (two) times daily. (Patient taking differently: Take 2 tablets by mouth daily.)     tamsulosin (FLOMAX) 0.4 MG CAPS capsule Take 1 capsule (0.4 mg total) by mouth daily. 30 capsule 0   triamcinolone (KENALOG) 0.1 % Apply 1 application topically daily as needed (irritation).     No current facility-administered medications on  file prior to visit.    ALLERGIES: Allergies  Allergen Reactions   Simvastatin Other (See Comments)    Leg pains    Benazepril Hcl Other (See Comments)    FAMILY HISTORY: Family History  Problem Relation Age of Onset   Stroke Mother    Hypertension Other        family history      Objective:  Blood pressure 108/75, pulse 66, height 5\' 9"  (1.753 m), weight 193 lb (87.5 kg), SpO2 94 %. General: No acute distress.  Patient appears well-groomed.   Head:  Normocephalic/atraumatic Eyes:  Fundi examined but not visualized Neck: supple, no paraspinal tenderness, full range of motion Heart:  Regular rate and rhythm Lungs:  Clear to auscultation bilaterally Back: No paraspinal tenderness Neurological Exam: alert and oriented to person, place, and time. speech fluent and not dysarthric, language intact.  CN II-XII intact. Bulk and tone normal, muscle strength 5/5 throughout.  Sensation to pinprick intact, vibratory sensation reduced in feet.  Deep tendon reflexes 2+ upper extremities, absent lower extremities, toes downgoing.  Finger to nose and heel to shin testing intact.  Wide based antalgic gait.  Romberg with sway   Metta Clines, DO  CC: Lajean Manes, MD

## 2020-09-20 ENCOUNTER — Ambulatory Visit: Payer: PPO | Admitting: Neurology

## 2020-09-20 ENCOUNTER — Encounter: Payer: Self-pay | Admitting: Neurology

## 2020-09-20 ENCOUNTER — Other Ambulatory Visit: Payer: Self-pay

## 2020-09-20 VITALS — BP 108/75 | HR 66 | Ht 69.0 in | Wt 193.0 lb

## 2020-09-20 DIAGNOSIS — I639 Cerebral infarction, unspecified: Secondary | ICD-10-CM

## 2020-09-20 DIAGNOSIS — I6522 Occlusion and stenosis of left carotid artery: Secondary | ICD-10-CM

## 2020-09-20 DIAGNOSIS — I5022 Chronic systolic (congestive) heart failure: Secondary | ICD-10-CM

## 2020-09-20 DIAGNOSIS — E782 Mixed hyperlipidemia: Secondary | ICD-10-CM

## 2020-09-20 DIAGNOSIS — I10 Essential (primary) hypertension: Secondary | ICD-10-CM

## 2020-09-20 NOTE — Patient Instructions (Signed)
Will check bilateral carotid artery ultrasound in 5 months Continue aspirin, cholesterol medications, blood pressure medication Follow up in 6 months.

## 2020-09-25 DIAGNOSIS — H5711 Ocular pain, right eye: Secondary | ICD-10-CM | POA: Diagnosis not present

## 2020-10-08 ENCOUNTER — Other Ambulatory Visit: Payer: Self-pay

## 2020-10-08 ENCOUNTER — Ambulatory Visit (HOSPITAL_COMMUNITY)
Admission: RE | Admit: 2020-10-08 | Discharge: 2020-10-08 | Disposition: A | Discharge status: Home / Self Care | Payer: PPO | Source: Ambulatory Visit | Attending: Internal Medicine | Admitting: Internal Medicine

## 2020-10-08 DIAGNOSIS — I6522 Occlusion and stenosis of left carotid artery: Secondary | ICD-10-CM | POA: Diagnosis not present

## 2020-10-08 DIAGNOSIS — I10 Essential (primary) hypertension: Secondary | ICD-10-CM | POA: Diagnosis not present

## 2020-10-08 DIAGNOSIS — Z8673 Personal history of transient ischemic attack (TIA), and cerebral infarction without residual deficits: Secondary | ICD-10-CM | POA: Diagnosis not present

## 2020-10-10 NOTE — Progress Notes (Signed)
Message left for patient to return my call.  

## 2020-10-14 ENCOUNTER — Telehealth: Payer: Self-pay | Admitting: Neurology

## 2020-10-14 NOTE — Telephone Encounter (Signed)
Patient called and left a voice mail returning a call back for results.

## 2020-10-15 MED ORDER — CLOPIDOGREL BISULFATE 75 MG PO TABS: 75.0000 mg | ORAL_TABLET | Freq: Every day | ORAL | 5 refills | Status: DC

## 2020-10-15 NOTE — Telephone Encounter (Signed)
Plavix sent to the pharmcy.  Pt wife transferred to the front desk to schedule a visit per Tomi Likens ok to do virtual

## 2020-10-15 NOTE — Telephone Encounter (Signed)
Tried calling pt no answer. LmoVm to call the office back.   Reviewed notes.  I would stop aspirin and start Plavix '75mg'$  daily for secondary stroke prevention.  Please send prescription to his pharmacy with 5 additional refills

## 2020-10-16 ENCOUNTER — Ambulatory Visit: Payer: PPO | Admitting: Neurology

## 2020-10-16 ENCOUNTER — Encounter: Payer: Self-pay | Admitting: Neurology

## 2020-10-16 ENCOUNTER — Other Ambulatory Visit: Payer: Self-pay

## 2020-10-16 VITALS — BP 138/62 | HR 97 | Resp 18 | Ht 68.0 in | Wt 182.0 lb

## 2020-10-16 DIAGNOSIS — G459 Transient cerebral ischemic attack, unspecified: Secondary | ICD-10-CM

## 2020-10-16 NOTE — Patient Instructions (Signed)
Continue clopidogrel '75mg'$  daily.  Otherwise, no change in medications Follow up in January as scheduled.

## 2020-10-16 NOTE — Progress Notes (Addendum)
NEUROLOGY FOLLOW UP OFFICE NOTE  AMOD LIQUORI [id number]  Assessment/Plan:   Recent transient ischemic attack presenting as expressive aphasia Right cerebellar peduncle infarct, likely secondary to small vessel disease Hypertension Hyperlipidemia History of left carotid artery disease - recent carotid ultrasound does not reveal hemodynamically significant ICA stenosis  No change in management: - Plavix - Statin - normotensive blood pressure  Subjective:  Ruben Barrera is an 87 year old right-handed male with HTN, HLD, cigar smoker, and lumbar stenosis with chronic back pain and spinal cord stimulator who follows up for recent stroke.     UPDATE Current medications:  Plavix '75mg'$  daily, Toprol-XL, pravastatin '20mg'$ , Zetia   On 10/07/2020, he was unable to get words out/effortful speech for about 10 minutes.  No slurred speech, facial droop or unilateral weakness or numbness.  He reports that he was under some stress and had also overexerted himself exercising on the treadmill.  ASA was changed to Plavix.  Carotid ultrasound on 10/08/2020 showed 1-39% stenosis of the bilateral ICAs, antegrade flow of vertebral artery bilaterally.  Currently feeling well.     HISTORY:   Initially saw him for bilateral lower extremity numbness.  Patient has chronic low back pain and spinal stenosis status post PLIF L4-5 and has had numerous lumbar spine MRIs.  Last MRI on 08/20/2018 personally reviewed showed severe spinal canal and bi foraminal stenosis at L3-4, right foraminal stenosis at L1-2 and PLIF L4-5 without significant stenosis. Following his second COVID vaccine, he developed worsening bilateral sciatic pain and weakness.  He also developed numbness of both feet as well as burning, which is worse when standing on hard surfaces and improved when barefoot or when he is off his feet.  During that time, he exhibited dizziness and slurred speech and went to the ED on 05/24/2019 where MRI of brain was  negative for acute infarct and CTA of head and neck showed no hemodynamically significant stenosis or occlusion.  It was thought to be a reaction to the vaccine.  He had an epidural injection on 05/26/2019 which was ineffective.  He was sent to pain management where he was started on hydrocodone and gabapentin.  He underwent a second injection in early June, which helped the radicular pain but continues to have numbness and burning in the feet.  He reportedly was checked for diabetes and his number was "mildly elevated".  Neuropathy labs from June 2021 included negative ANA, sed rate 5, B12 398, folate >24.8, SPEP/IFE negative for monoclonal protein.  NCV-EMG in August showed chronic bilateral L5-S1 radiculopathy and chronic right L3-4 radiculopathy with no evidence of large fiber sensorimotor polyneuropathy.   He was admitted to [facility] on 02/20/2020 for cerebellar stroke, presenting with recurrent double vision and difficulty writing his name.  CT of head showed hypodensity in the right cerebellar peduncle.  Follow up MRI of brain showed possible subacute rounded infarct in the right cerebellar peduncle.  MRI of brain with contrast demonstrated no abnormal enhancement.  CTA of head and neck showed 70% stenosis at the left ICA origin and high-grade right P2 and left P3 stenosis.  2D echocardiogram showed EF 30-35% with global hypokinesis.  LDL was 122 and Hgb A1c 5.6.  He was started on ASA '81mg'$  and Plavix '75mg'$  daily for 3 weeks followed by ASA alone.  Was on Zetia.  Unable to tolerate simvastatin.  In hospital, low-dose pravastatin '20mg'$  was added.  Cardiology consulted for ischemic workup and outpatient cardiac monitoring to rule  out a fib.  Vascular surgery and cardiology follow up was ordered.  Cardiac event monitor was negative for atrial fibrillation.  Overall, symptoms have resolved.  During the day, when he is tired, he may slur his speech and feel a little more off-balance.  PAST MEDICAL  HISTORY: Past Medical History:  Diagnosis Date   Arthritis    Coronary artery disease    Depression    Dysrhythmia    "extra heart beat"   GERD (gastroesophageal reflux disease)    History of hiatal hernia    Hypercholesteremia    Hypertension    Lumbar spinal stenosis    RLS (restless legs syndrome)    takes ativan as needed   Stroke ([place]) 01/2020   minor - no deficits    MEDICATIONS: Current Outpatient Medications on File Prior to Visit  Medication Sig Dispense Refill   acetaminophen (TYLENOL) 325 MG tablet Take 2 tablets (650 mg total) by mouth every 4 (four) hours as needed for mild pain (or temp > 37.5 C (99.5 F)).     aspirin EC 81 MG EC tablet Take 1 tablet (81 mg total) by mouth daily. Swallow whole. 30 tablet 11   Baclofen 5 MG TABS Take 5 mg by mouth 3 (three) times daily as needed. 90 tablet 0   celecoxib (CELEBREX) 200 MG capsule Take 200 mg by mouth daily.     citalopram (CELEXA) 10 MG tablet Take 1 tablet (10 mg total) by mouth daily. 30 tablet 0   clopidogrel (PLAVIX) 75 MG tablet Take 1 tablet (75 mg total) by mouth daily. 30 tablet 5   ezetimibe (ZETIA) 10 MG tablet Take 1 tablet (10 mg total) by mouth every evening. 30 tablet 0   fluticasone (FLONASE) 50 MCG/ACT nasal spray Place 1 spray into both nostrils daily as needed for allergies.      furosemide (LASIX) 20 MG tablet Take 1 tablet (20 mg total) by mouth daily. 90 tablet 3   gabapentin (NEURONTIN) 400 MG capsule Take 400 mg by mouth at bedtime.     gabapentin (NEURONTIN) 600 MG tablet Take 1 tablet (600 mg total) by mouth 3 (three) times daily. (Patient not taking: Reported on 09/20/2020) 90 tablet 0   HYDROcodone-acetaminophen (NORCO) 7.5-325 MG tablet Take 1 tablet by mouth every 8 (eight) hours as needed for moderate pain. 20 tablet 0   loratadine (CLARITIN) 10 MG tablet Take 10 mg by mouth daily as needed.     LORazepam (ATIVAN) 1 MG tablet Take 1 tablet (1 mg total) by mouth every 8 (eight) hours as needed  for anxiety. 10 tablet 0   Magnesium 250 MG TABS Take 250 mg by mouth daily in the afternoon.     metoprolol succinate (TOPROL-XL) 25 MG 24 hr tablet Take 1 tablet by mouth daily.     MULTIPLE VITAMIN PO Take 1 tablet by mouth daily.      omeprazole (PRILOSEC) 20 MG capsule Take 20 mg by mouth daily.     pravastatin (PRAVACHOL) 20 MG tablet Take 1 tablet (20 mg total) by mouth daily at 6 PM. 30 tablet 0   saccharomyces boulardii (FLORASTOR) 250 MG capsule Take 250 mg by mouth daily in the afternoon.     senna-docusate (SENOKOT-S) 8.6-50 MG tablet Take 1 tablet by mouth 2 (two) times daily. (Patient taking differently: Take 2 tablets by mouth daily.)     tamsulosin (FLOMAX) 0.4 MG CAPS capsule Take 1 capsule (0.4 mg total) by mouth daily. 30 capsule  0   triamcinolone (KENALOG) 0.1 % Apply 1 application topically daily as needed (irritation).     No current facility-administered medications on file prior to visit.    ALLERGIES: Allergies  Allergen Reactions   Simvastatin Other (See Comments)    Leg pains    Benazepril Hcl Other (See Comments)    FAMILY HISTORY: Family History  Problem Relation Age of Onset   Stroke Mother    Hypertension Other        family history      Objective:  Blood pressure 138/62, pulse 97, resp. rate 18, height '5\' 8"'$  (1.727 m), weight 182 lb (82.6 kg), SpO2 97 %. General: No acute distress.  Patient appears well-groomed.   Head:  Normocephalic/atraumatic Eyes:  Fundi examined but not visualized Neck: supple, no paraspinal tenderness, full range of motion Heart:  Regular rate and rhythm Lungs:  Clear to auscultation bilaterally Back: No paraspinal tenderness Neurological Exam: alert and oriented to person, place, and time.  Speech fluent and not dysarthric, language intact.  CN II-XII intact. Bulk and tone normal, muscle strength 5/5 throughout.  Sensation to light touch intact.  Deep tendon reflexes 2+ throughout, toes downgoing.  Finger to nose testing  intact.  Wide based gait.  Romberg with sway.   Ruben Clines, DO  CC: Ruben Manes, MD

## 2020-10-22 ENCOUNTER — Telehealth: Payer: Self-pay | Admitting: Neurology

## 2020-10-22 NOTE — Telephone Encounter (Signed)
Pt called and LM , he wants a call back from [place] regarding his plavix. He said he has taken himself off of plavix.his heart rate has went from 70 to 36 and he has never had this happen. He wants to know the difference b/t plavix and aspirin

## 2020-10-24 NOTE — Telephone Encounter (Signed)
Patient is calling back and would like a call today about this

## 2020-10-24 NOTE — Telephone Encounter (Signed)
Tried calling pt back, No answer.  I have not heard of Plavix causing drop in heart rate.  But he may return to aspirin instead if he wishes.  Aspirin and Plavix are both blood thinners.  However, he should follow up with his cardiologist about the heart rate.

## 2020-10-30 DIAGNOSIS — Z9689 Presence of other specified functional implants: Secondary | ICD-10-CM | POA: Diagnosis not present

## 2020-10-30 DIAGNOSIS — M48062 Spinal stenosis, lumbar region with neurogenic claudication: Secondary | ICD-10-CM | POA: Diagnosis not present

## 2020-11-05 DIAGNOSIS — R269 Unspecified abnormalities of gait and mobility: Secondary | ICD-10-CM | POA: Diagnosis not present

## 2020-11-19 DIAGNOSIS — R269 Unspecified abnormalities of gait and mobility: Secondary | ICD-10-CM | POA: Diagnosis not present

## 2020-11-21 ENCOUNTER — Ambulatory Visit (HOSPITAL_COMMUNITY): Payer: PPO | Attending: Cardiology

## 2020-11-21 ENCOUNTER — Other Ambulatory Visit: Payer: Self-pay

## 2020-11-21 DIAGNOSIS — I350 Nonrheumatic aortic (valve) stenosis: Secondary | ICD-10-CM | POA: Diagnosis not present

## 2020-11-21 LAB — ECHOCARDIOGRAM COMPLETE
AR max vel: 0.75 cm2
AV Area VTI: 0.76 cm2
AV Area mean vel: 0.73 cm2
AV Mean grad: 23 mmHg
AV Peak grad: 41.2 mmHg
Ao pk vel: 3.21 m/s
Area-P 1/2: 2.37 cm2
S' Lateral: 5.2 cm

## 2020-11-24 ENCOUNTER — Telehealth: Payer: Self-pay | Admitting: Home Health

## 2020-11-24 NOTE — Telephone Encounter (Signed)
Patient called after-hours line reporting low heart rate, called back at [phone], spoke to patient directly.  Patient states his apple watch has been alarming him with low heart rate down to 40s x4 episodes today.  He noted his heart rate would improved to 70s when he lays down.  He denies any chest pain, shortness of breath, dizziness, syncope episode.  He is wondering if pravastatin is causing his slow heart rate.  Questioning the accuracy of apple watch sensor.  Instructed patient to use his home blood pressure machine to take his blood pressure and heart rate, which reports heart rate of 78.  At this time, advised patient to continue take metoprolol XL 25 mg daily as it is beneficial for his CAD, NSVT, PVCs, and CHF.  Advised patient to use blood pressure machine verify heart rate as Apple Watch may not be accurate.  Patient is agreeable with above plan.  All questions answered to satisfaction.

## 2020-12-02 DIAGNOSIS — F330 Major depressive disorder, recurrent, mild: Secondary | ICD-10-CM | POA: Diagnosis not present

## 2020-12-02 DIAGNOSIS — I10 Essential (primary) hypertension: Secondary | ICD-10-CM | POA: Diagnosis not present

## 2020-12-02 DIAGNOSIS — Z23 Encounter for immunization: Secondary | ICD-10-CM | POA: Diagnosis not present

## 2020-12-02 DIAGNOSIS — M545 Low back pain, unspecified: Secondary | ICD-10-CM | POA: Diagnosis not present

## 2020-12-06 ENCOUNTER — Encounter: Payer: Self-pay | Admitting: Internal Medicine

## 2020-12-06 ENCOUNTER — Ambulatory Visit (INDEPENDENT_AMBULATORY_CARE_PROVIDER_SITE_OTHER): Payer: PPO | Admitting: Internal Medicine

## 2020-12-06 ENCOUNTER — Other Ambulatory Visit: Payer: Self-pay

## 2020-12-06 VITALS — BP 148/78 | HR 61 | Ht 68.0 in | Wt 182.0 lb

## 2020-12-06 DIAGNOSIS — I5020 Unspecified systolic (congestive) heart failure: Secondary | ICD-10-CM

## 2020-12-06 DIAGNOSIS — I471 Supraventricular tachycardia, unspecified: Secondary | ICD-10-CM

## 2020-12-06 DIAGNOSIS — I2510 Atherosclerotic heart disease of native coronary artery without angina pectoris: Secondary | ICD-10-CM | POA: Diagnosis not present

## 2020-12-06 DIAGNOSIS — I493 Ventricular premature depolarization: Secondary | ICD-10-CM

## 2020-12-06 DIAGNOSIS — I350 Nonrheumatic aortic (valve) stenosis: Secondary | ICD-10-CM

## 2020-12-06 NOTE — Progress Notes (Signed)
Cardiology Office Note:    Date:  12/06/2020   ID:  [name], DOB [birth year], MRN [id number]  PCP:  Lajean Manes, MD  [facility] Cardiologist:  Werner Lean, MD  [place] Electrophysiologist:  None   CC: Severe AS discussion.  History of Present Illness:    Ruben Barrera is a 87 y.o. male with a hx of HTN, HLD, Non-obstructive CAD without intervention, HFrEF,  LFLG-AS, right CVA stroke, frequent PVCs and SVT, and Tobacco Abuse (occasional cigar) who presented for evaluation 04/19/20.  In interval had LHC 03/28/20 with diagnosis of CAD and AS.   In interim of this visit, patient EP eval- no plans for more aggressive NSVT therapy; per EP and given age no plans for ICD.  Had back surgery 07/10/20.  I had also discuss his MRA with Dr. Felipa Eth (we DC/ed this medication).  Since last visit he has had echo showing that is again consistent with Moderate to Severe LFLG AS.  Seen 12/06/20 to evaluation for symptoms.  Patient notes that he is doing terribly:  his stimulator has not been working after the first 5 months of success and he now has excruciating back pain. This has impeded his physical rehab on his worse days.  He slept poorly today and this has affect his symptoms today.  Recent echo showed improvement in his LVEF, his DVI and LVSVI have decreased. There are no interval hospital/ED visit.    No chest pain or pressure .  No SOB/DOE and no PND/Orthopnea.  No weight gain or leg swelling.  No palpitations or syncope.  He is able to ride his stationary bike for longer; his legs feels stronger and he is working to improved his leg strength   Past Medical History:  Diagnosis Date   Arthritis    Coronary artery disease    Depression    Dysrhythmia    "extra heart beat"   GERD (gastroesophageal reflux disease)    History of hiatal hernia    Hypercholesteremia    Hypertension    Lumbar spinal stenosis    RLS (restless legs syndrome)    takes ativan as needed    Stroke ([place]) 01/2020   minor - no deficits    Past Surgical History:  Procedure Laterality Date   BACK SURGERY     COLONOSCOPY     EYE SURGERY Bilateral    cataracts removed    LUMBAR LAMINECTOMY/DECOMPRESSION MICRODISCECTOMY N/A 08/28/2015   Procedure: Lumbar Four-Five decompressive lumbar laminectomy;  Surgeon: Jovita Gamma, MD;  Location: [place] [facility];  Service: Neurosurgery;  Laterality: N/A;   REPLACEMENT TOTAL KNEE BILATERAL Bilateral 11/05/2003   RIGHT/LEFT HEART CATH AND CORONARY ANGIOGRAPHY N/A 03/28/2020   Procedure: RIGHT/LEFT HEART CATH AND CORONARY ANGIOGRAPHY;  Surgeon: Burnell Blanks, MD;  Location: [place];  Service: Cardiovascular;  Laterality: N/A;   THORACIC DISCECTOMY N/A 07/10/2020   Procedure: Spinal cord stimulator via  - Thoracic Eight-Thoracic Nine, Thoracic Nine-Thoracic Ten Laminectomy;  Surgeon: Eustace Moore, MD;  Location: [facility];  Service: Neurosurgery;  Laterality: N/A;  3C   TOE AMPUTATION Right 2010   second toe   TONSILLECTOMY      Current Medications: Current Meds  Medication Sig   acetaminophen (TYLENOL) 325 MG tablet Take 2 tablets (650 mg total) by mouth every 4 (four) hours as needed for mild pain (or temp > 37.5 C (99.5 F)).   aspirin EC 81 MG EC tablet Take 1 tablet (81 mg total)  by mouth daily. Swallow whole.   Baclofen 5 MG TABS Take 5 mg by mouth 3 (three) times daily as needed.   celecoxib (CELEBREX) 200 MG capsule Take 200 mg by mouth daily.   citalopram (CELEXA) 20 MG tablet Take 20 mg by mouth daily.   ezetimibe (ZETIA) 10 MG tablet Take 1 tablet (10 mg total) by mouth every evening.   fluticasone (FLONASE) 50 MCG/ACT nasal spray Place 1 spray into both nostrils daily as needed for allergies.    furosemide (LASIX) 20 MG tablet Take 1 tablet (20 mg total) by mouth daily.   gabapentin (NEURONTIN) 400 MG capsule Take 400 mg by mouth at bedtime.   gabapentin (NEURONTIN) 600 MG tablet Take 1 tablet (600 mg total) by mouth  3 (three) times daily.   HYDROcodone-acetaminophen (NORCO) 7.5-325 MG tablet Take 1 tablet by mouth every 8 (eight) hours as needed for moderate pain.   loratadine (CLARITIN) 10 MG tablet Take 10 mg by mouth daily as needed.   LORazepam (ATIVAN) 1 MG tablet Take 1 tablet (1 mg total) by mouth every 8 (eight) hours as needed for anxiety.   Magnesium 250 MG TABS Take 250 mg by mouth daily in the afternoon.   metoprolol succinate (TOPROL-XL) 25 MG 24 hr tablet Take 1 tablet by mouth daily.   MULTIPLE VITAMIN PO Take 1 tablet by mouth daily.    omeprazole (PRILOSEC) 20 MG capsule Take 20 mg by mouth daily.   pravastatin (PRAVACHOL) 20 MG tablet Take 1 tablet (20 mg total) by mouth daily at 6 PM.   saccharomyces boulardii (FLORASTOR) 250 MG capsule Take 250 mg by mouth daily in the afternoon.   senna-docusate (SENOKOT-S) 8.6-50 MG tablet Take 1 tablet by mouth 2 (two) times daily. (Patient taking differently: Take 2 tablets by mouth daily.)   tamsulosin (FLOMAX) 0.4 MG CAPS capsule Take 1 capsule (0.4 mg total) by mouth daily.   triamcinolone (KENALOG) 0.1 % Apply 1 application topically daily as needed (irritation).     Allergies:   Simvastatin and Benazepril hcl   Social History   Socioeconomic History   Marital status: Married    Spouse name: Not on file   Number of children: Not on file   Years of education: Not on file   Highest education level: Not on file  Occupational History   Not on file  Tobacco Use   Smoking status: Some Days    Types: Cigars   Smokeless tobacco: Never   Tobacco comments:    quit 1972- currently 1 cigar per week  Vaping Use   Vaping Use: Never used  Substance and Sexual Activity   Alcohol use: Yes    Comment: occasionally beer/wine or scotch   Drug use: No    Comment: 1 cigar per week   Sexual activity: Not Currently  Other Topics Concern   Not on file  Social History Narrative   Right handed   Lives with wife one story home   Social Determinants  of Health   Financial Resource Strain: Not on file  Food Insecurity: Not on file  Transportation Needs: Not on file  Physical Activity: Not on file  Stress: Not on file  Social Connections: Not on file    Social: Wife Comes to visit and takes notes for him, Former Teacher, English as a foreign language of [employer]; recently has 27thth wedding anniversary  (2022)  Family History: The patient's family history includes Hypertension in an other family member; Stroke in his mother.  No history  of congestive heart failure  ROS:   Please see the history of present illness.    All other systems reviewed and are negative.  EKGs/Labs/Other Studies Reviewed:    The following studies were reviewed today:  EKG:   02/21/20:  Sinus Rhythm Rate 84 with Rare PVCs, and PACs, QTc 514 ms  Cardiac Event Monitoring: Date 03/25/2020 Personally reviewed Results:   Patient had a minimum heart rate of 44 bpm, maximum heart rate of 171 bpm, and average heart rate of 68 bpm. Predominant underlying rhythm was sinus rhythm. Three runs of ventricular tachycardia occurred lasting 11 beats at longest with a max rate of 143 bpm. Eighty-seven runs of supraventricular tachycardia occurred, lasting 17.1 seconds at longest with a max rate of 171 bpm at fastest. Isolated PACs were occasional (4.2%), with rare couplets and triplets present. Isolated PVCs were frequent (5.1%), with rare couplets, triplets, bigeminy, and trigeminy present. No evidence of complete heart block. No triggered and diary events.   Frequent PVCs, SVT, with runs of NSVT.   Transthoracic Echocardiogram: Date:11/21/20 Results: 1. Left ventricular ejection fraction, by estimation, is 40 to 45%. The  left ventricle has mildly decreased function. The left ventricle  demonstrates regional wall motion abnormalities (see scoring  diagram/findings for description). The left ventricular   internal cavity size was moderately dilated. Left ventricular diastolic  parameters are  consistent with Grade I diastolic dysfunction (impaired  relaxation).   2. Right ventricular systolic function is mildly reduced. The right  ventricular size is mildly enlarged.   3. Left atrial size was severely dilated.   4. The mitral valve is normal in structure. Mild mitral valve  regurgitation. No evidence of mitral stenosis.   5. The aortic valve has an indeterminant number of cusps. There is severe  calcifcation of the aortic valve. There is severe thickening of the aortic  valve. Aortic valve regurgitation is not visualized. Severe aortic valve  stenosis. Aortic valve area, by   VTI measures 0.76 cm. Aortic valve mean gradient measures 23.0 mmHg.  Aortic valve Vmax measures 3.21 m/s. Mean AVG and peak velocity are  consistent with moderate AS but the SVI is low at 30 and the dimensionless  index is low at 0.22. Suspect this  respresents low flow low gradient severe AS in setting of reduced EF.   6. The inferior vena cava is normal in size with greater than 50%  respiratory variability, suggesting right atrial pressure of 3 mmHg.   7. Compared to study of 02/2020, the mean transaortic gradient has  decreased from 25 to 10mHg but the dimensionless index has decreased from  0.28 to 0.22 and SVI has decreased from 40 to 30. LVF appears improved  from prior study.   FINDINGS   Left Ventricle: LVMI 197 g/m2 RWT 0.54. Left ventricular ejection fraction, by estimation, is 30 to 35%.  The left ventricle has moderately decreased function. The left ventricle  demonstrates global hypokinesis. The left ventricular internal cavity size  was mildly dilated. There is severe concentric left ventricular hypertrophy. Left ventricular diastolic parameters are indeterminate.   NM Stress Testing : Date: 02/21/2020 Results:  Notes Recorded by KDorothy Spark MD on 05/10/2014 at 2:50 PM Normal stress test  Left/Right Heart Catheterizations: Date: 03/28/2020 Results: Prox Cx lesion is 30%  stenosed. Prox LAD to Mid LAD lesion is 60% stenosed. Dist LAD lesion is 60% stenosed.   1. The LAD is a large vessel that courses to the apex. Moderate, heavily calcified proximal  to mid stenosis that does is eccentric and does not appear to flow limiting. Focal mid to distal moderate stenosis.  2. Moderate disease in the intermediate branch 3. Mild plaque in the Circumflex 4. Large, dominant RCA with no obstructive disease 5. Low flow/low gradient moderate to severe aortic stenosis (Cath data: Mean gradient 16.20mHg, peak to peak gradient 16 mmHg). By echo his dimensionless index is 0.28 and SVI is 40.    Recommendations: He appears to have low flow/low gradient moderate to severe aortic stenosis. He has moderate CAD but in the absence of angina, would not treat the calcific disease in the proximal and mid LAD. He is asymptomatic at this time.    Recent Labs: 02/23/2020: ALT 22; TSH 0.757 03/19/2020: Magnesium 2.4; NT-Pro BNP 1,910 07/10/2020: BUN 26; Creatinine, Ser 1.11; Hemoglobin 15.3; Platelets 225; Potassium 4.2; Sodium 138  Recent Lipid Panel    Component Value Date/Time   CHOL 204 (H) 02/21/2020 0253   TRIG 199 (H) 02/21/2020 0253   HDL 42 02/21/2020 0253   CHOLHDL 4.9 02/21/2020 0253   VLDL 40 02/21/2020 0253   LDLCALC 122 (H) 02/21/2020 0253     Physical Exam:    VS:  BP (!) 148/78   Pulse 61   Ht '5\' 8"'$  (1.727 m)   Wt 182 lb (82.6 kg)   SpO2 92%   BMI 27.67 kg/m     Wt Readings from Last 3 Encounters:  12/06/20 182 lb (82.6 kg)  10/16/20 182 lb (82.6 kg)  09/20/20 193 lb (87.5 kg)    GEN: Well nourished, well developed in no acute distress HEENT: Normal NECK: No JVD LYMPHATICS: No lymphadenopathy CARDIAC: RRR, III/VI systolic crescendo murmur without rubs or gallops, late S2, no irregular beats RESPIRATORY:  Clear to auscultation without rales, wheezing or rhonchi  ABDOMEN: Soft, non-tender, non-distended MUSCULOSKELETAL:  No edema; No deformity  SKIN:  Warm and dry NEUROLOGIC:  Alert and oriented x 3 PSYCHIATRIC:  Normal affect   ASSESSMENT:    1. Moderate to severe aortic stenosis   2. HFrEF (heart failure with reduced ejection fraction) ([place]   3. SVT (supraventricular tachycardia) ([place]   4. Frequent PVCs   5. Coronary artery disease involving native coronary artery of native heart without angina pectoris     PLAN:    In order of problems listed above:  Heart Failure Reduced Ejection Fraction  Low Flow Low Gradient  moderate to severe aortic stenosis - NYHA class I, Stage B, euvolemic, etiology likely multifocal; CAD may be a component, also had frequent PVCs  (improved with low dose toprol) - Diuretic regimen: lasix 20 mg PO daily  - Strict I/Os, daily weights, and fluid restriction of < 2 L  - continue 25 mg metoprolol XL - ARB related symptomatic hypotension stopped; no plans to ARNI challenge - MRA stopped by PCP in the past - We have discussed and offered SGLT2i; will continue to do this in prior visits - we have discussed signs and symptoms of aortic stenosis not limited to increased fatigue, SOB, DOE, CP, near syncope or syncope.  If these symptoms occur we will get a TAVR CT and start structural work up. - repeat echo in one year  Coronary Artery Disease; Nonobstructive HLD - asymptomatic - anatomy: Prox LAD 60%, mid LAD 60%, prox Cir 30% - continue ASA 81 mg -goal LDL < 70 (multiple statin intolerances in the past) -continue Zetia and pravastatin - continue BB - gave education on dietary changes  NSVT  and Frequent PVCs - AV Nodal Therapy: BB as above - seen by  EP  Tobacco Abuse-> - Down to two cigars a week (Pre-Contemplative for further cessation)  Will plan for one year follow up unless new symptoms or abnormal test results warranting change in plan  Would be reasonable for  APP Follow up  Time Spent Directly with Patient:   I have spent a total of 40 minutes with the patient reviewing notes,  imaging, EKGs, labs and examining the patient as well as establishing an assessment and plan that was discussed personally with the patient.  > 50% of time was spent in direct patient care and family.   Medication Adjustments/Labs and Tests Ordered: Current medicines are reviewed at length with the patient today.  Concerns regarding medicines are outlined above.  Orders Placed This Encounter  Procedures   ECHOCARDIOGRAM COMPLETE    No orders of the defined types were placed in this encounter.   Patient Instructions  Medication Instructions:   Your physician recommends that you continue on your current medications as directed. Please refer to the Current Medication list given to you today.  *If you need a refill on your cardiac medications before your next appointment, please call your pharmacy*   Testing/Procedures:  Your physician has requested that you have an echocardiogram. Echocardiography is a painless test that uses sound waves to create images of your heart. It provides your doctor with information about the size and shape of your heart and how well your heart's chambers and valves are working. This procedure takes approximately one hour. There are no restrictions for this procedure.  ECHO SHOULD BE SCHEDULED FOR THE PT TO HAVE DONE IN ONE YEAR PER DR. Lashannon Bresnan    Follow-Up: At [facility], you and your health needs are our priority.  As part of our continuing mission to provide you with exceptional heart care, we have created designated Provider Care Teams.  These Care Teams include your primary Cardiologist (physician) and Advanced Practice Providers (APPs -  Physician Assistants and Nurse Practitioners) who all work together to provide you with the care you need, when you need it.  We recommend signing up for the patient portal called "MyChart".  Sign up information is provided on this After Visit Summary.  MyChart is used to connect with patients for Virtual Visits  (Telemedicine).  Patients are able to view lab/test results, encounter notes, upcoming appointments, etc.  Non-urgent messages can be sent to your provider as well.   To learn more about what you can do with MyChart, go to [email or web address].    Your next appointment:   1 year(s)  The format for your next appointment:   In Person  Provider:   Rudean Haskell, MD      Signed, Werner Lean, MD  12/06/2020 8:58 AM    [place]

## 2020-12-06 NOTE — Patient Instructions (Signed)
Medication Instructions:   Your physician recommends that you continue on your current medications as directed. Please refer to the Current Medication list given to you today.  *If you need a refill on your cardiac medications before your next appointment, please call your pharmacy*   Testing/Procedures:  Your physician has requested that you have an echocardiogram. Echocardiography is a painless test that uses sound waves to create images of your heart. It provides your doctor with information about the size and shape of your heart and how well your heart's chambers and valves are working. This procedure takes approximately one hour. There are no restrictions for this procedure.  ECHO SHOULD BE SCHEDULED FOR THE PT TO HAVE DONE IN ONE YEAR PER DR. CHANDRASEKHAR    Follow-Up: At [facility], you and your health needs are our priority.  As part of our continuing mission to provide you with exceptional heart care, we have created designated Provider Care Teams.  These Care Teams include your primary Cardiologist (physician) and Advanced Practice Providers (APPs -  Physician Assistants and Nurse Practitioners) who all work together to provide you with the care you need, when you need it.  We recommend signing up for the patient portal called "MyChart".  Sign up information is provided on this After Visit Summary.  MyChart is used to connect with patients for Virtual Visits (Telemedicine).  Patients are able to view lab/test results, encounter notes, upcoming appointments, etc.  Non-urgent messages can be sent to your provider as well.   To learn more about what you can do with MyChart, go to [email or web address].    Your next appointment:   1 year(s)  The format for your next appointment:   In Person  Provider:   Rudean Haskell, MD

## 2020-12-10 ENCOUNTER — Telehealth: Payer: Self-pay | Admitting: Internal Medicine

## 2020-12-10 NOTE — Telephone Encounter (Signed)
Called pt back in regards to medication issues.  I advised pt that our office did not prescribe listed medications.  I did advise pt that clopidogrel does not cause a low heart rate.  I advised him to contact Dr. Tomi Likens office to address his concerns and to determine if he should restart medication.  He expressed that he doesn't know if he is developing some dementia; but once he stopped taking clopidogrel his heart rate is no longer low.  I advised him that beta blockers may cause low heart rate.  Per pt he is not having low heart rate since stopping med.  Pt expresses that he will call Dr. Tomi Likens office.

## 2020-12-10 NOTE — Telephone Encounter (Signed)
Pt is calling in saying that she is having to cut the med Baclofen 5 MG TABS pt is going to ask pharmacy if they are able to cut for her.    citalopram (CELEXA) 20 MG tablet this is the correct dosage, pt is wantin other rx for this to be removed off of the med list    clopidogrel (PLAVIX) 75 MG tablet Pts heart rate has gotten low since taking this.

## 2020-12-11 ENCOUNTER — Telehealth: Payer: Self-pay | Admitting: Neurology

## 2020-12-11 DIAGNOSIS — I5022 Chronic systolic (congestive) heart failure: Secondary | ICD-10-CM | POA: Diagnosis not present

## 2020-12-11 DIAGNOSIS — F322 Major depressive disorder, single episode, severe without psychotic features: Secondary | ICD-10-CM | POA: Diagnosis not present

## 2020-12-11 DIAGNOSIS — F330 Major depressive disorder, recurrent, mild: Secondary | ICD-10-CM | POA: Diagnosis not present

## 2020-12-11 DIAGNOSIS — I10 Essential (primary) hypertension: Secondary | ICD-10-CM | POA: Diagnosis not present

## 2020-12-11 DIAGNOSIS — E7800 Pure hypercholesterolemia, unspecified: Secondary | ICD-10-CM | POA: Diagnosis not present

## 2020-12-11 NOTE — Telephone Encounter (Signed)
Per Dr.Jaffe that sound good. If the Patient has any problems please advise [place].

## 2020-12-12 DIAGNOSIS — Z9689 Presence of other specified functional implants: Secondary | ICD-10-CM | POA: Diagnosis not present

## 2020-12-12 DIAGNOSIS — M48062 Spinal stenosis, lumbar region with neurogenic claudication: Secondary | ICD-10-CM | POA: Diagnosis not present

## 2020-12-12 DIAGNOSIS — I10 Essential (primary) hypertension: Secondary | ICD-10-CM | POA: Diagnosis not present

## 2020-12-12 DIAGNOSIS — M961 Postlaminectomy syndrome, not elsewhere classified: Secondary | ICD-10-CM | POA: Diagnosis not present

## 2020-12-27 DIAGNOSIS — R269 Unspecified abnormalities of gait and mobility: Secondary | ICD-10-CM | POA: Diagnosis not present

## 2020-12-27 DIAGNOSIS — G629 Polyneuropathy, unspecified: Secondary | ICD-10-CM | POA: Diagnosis not present

## 2021-01-13 DIAGNOSIS — I10 Essential (primary) hypertension: Secondary | ICD-10-CM | POA: Diagnosis not present

## 2021-01-13 DIAGNOSIS — F322 Major depressive disorder, single episode, severe without psychotic features: Secondary | ICD-10-CM | POA: Diagnosis not present

## 2021-01-22 DIAGNOSIS — M961 Postlaminectomy syndrome, not elsewhere classified: Secondary | ICD-10-CM | POA: Diagnosis not present

## 2021-01-22 DIAGNOSIS — Z9689 Presence of other specified functional implants: Secondary | ICD-10-CM | POA: Diagnosis not present

## 2021-02-18 ENCOUNTER — Encounter: Payer: Self-pay | Admitting: Neurology

## 2021-02-24 ENCOUNTER — Encounter (HOSPITAL_COMMUNITY): Payer: Self-pay | Admitting: Emergency Medicine

## 2021-02-24 ENCOUNTER — Other Ambulatory Visit: Payer: Self-pay

## 2021-02-24 ENCOUNTER — Emergency Department (HOSPITAL_COMMUNITY): Payer: PPO

## 2021-02-24 ENCOUNTER — Emergency Department (HOSPITAL_COMMUNITY)
Admission: EM | Admit: 2021-02-24 | Discharge: 2021-02-24 | Discharge status: Against Medical Advice | Payer: PPO | Source: Home / Self Care | Attending: Emergency Medicine | Admitting: Emergency Medicine

## 2021-02-24 ENCOUNTER — Inpatient Hospital Stay (HOSPITAL_COMMUNITY)
Admission: EM | Admit: 2021-02-24 | Discharge: 2021-03-01 | DRG: 871 | Disposition: A | Discharge status: Home / Self Care | Payer: PPO | Attending: Internal Medicine | Admitting: Internal Medicine

## 2021-02-24 DIAGNOSIS — Z79899 Other long term (current) drug therapy: Secondary | ICD-10-CM | POA: Insufficient documentation

## 2021-02-24 DIAGNOSIS — I491 Atrial premature depolarization: Secondary | ICD-10-CM | POA: Diagnosis not present

## 2021-02-24 DIAGNOSIS — I5022 Chronic systolic (congestive) heart failure: Secondary | ICD-10-CM | POA: Insufficient documentation

## 2021-02-24 DIAGNOSIS — I340 Nonrheumatic mitral (valve) insufficiency: Secondary | ICD-10-CM | POA: Diagnosis not present

## 2021-02-24 DIAGNOSIS — K219 Gastro-esophageal reflux disease without esophagitis: Secondary | ICD-10-CM | POA: Diagnosis not present

## 2021-02-24 DIAGNOSIS — J9690 Respiratory failure, unspecified, unspecified whether with hypoxia or hypercapnia: Secondary | ICD-10-CM | POA: Diagnosis not present

## 2021-02-24 DIAGNOSIS — A408 Other streptococcal sepsis: Principal | ICD-10-CM | POA: Diagnosis present

## 2021-02-24 DIAGNOSIS — R509 Fever, unspecified: Secondary | ICD-10-CM | POA: Diagnosis not present

## 2021-02-24 DIAGNOSIS — N179 Acute kidney failure, unspecified: Secondary | ICD-10-CM | POA: Diagnosis not present

## 2021-02-24 DIAGNOSIS — I130 Hypertensive heart and chronic kidney disease with heart failure and stage 1 through stage 4 chronic kidney disease, or unspecified chronic kidney disease: Secondary | ICD-10-CM | POA: Diagnosis present

## 2021-02-24 DIAGNOSIS — R0602 Shortness of breath: Secondary | ICD-10-CM | POA: Diagnosis not present

## 2021-02-24 DIAGNOSIS — Z888 Allergy status to other drugs, medicaments and biological substances status: Secondary | ICD-10-CM

## 2021-02-24 DIAGNOSIS — J9600 Acute respiratory failure, unspecified whether with hypoxia or hypercapnia: Secondary | ICD-10-CM | POA: Diagnosis not present

## 2021-02-24 DIAGNOSIS — Z89421 Acquired absence of other right toe(s): Secondary | ICD-10-CM | POA: Diagnosis not present

## 2021-02-24 DIAGNOSIS — A419 Sepsis, unspecified organism: Secondary | ICD-10-CM | POA: Diagnosis not present

## 2021-02-24 DIAGNOSIS — Z823 Family history of stroke: Secondary | ICD-10-CM

## 2021-02-24 DIAGNOSIS — Z8673 Personal history of transient ischemic attack (TIA), and cerebral infarction without residual deficits: Secondary | ICD-10-CM | POA: Diagnosis not present

## 2021-02-24 DIAGNOSIS — R7881 Bacteremia: Secondary | ICD-10-CM | POA: Insufficient documentation

## 2021-02-24 DIAGNOSIS — J9601 Acute respiratory failure with hypoxia: Secondary | ICD-10-CM

## 2021-02-24 DIAGNOSIS — I5023 Acute on chronic systolic (congestive) heart failure: Secondary | ICD-10-CM | POA: Diagnosis not present

## 2021-02-24 DIAGNOSIS — F32A Depression, unspecified: Secondary | ICD-10-CM | POA: Diagnosis not present

## 2021-02-24 DIAGNOSIS — J129 Viral pneumonia, unspecified: Secondary | ICD-10-CM | POA: Diagnosis present

## 2021-02-24 DIAGNOSIS — R0902 Hypoxemia: Secondary | ICD-10-CM | POA: Diagnosis not present

## 2021-02-24 DIAGNOSIS — N189 Chronic kidney disease, unspecified: Secondary | ICD-10-CM | POA: Diagnosis not present

## 2021-02-24 DIAGNOSIS — I517 Cardiomegaly: Secondary | ICD-10-CM | POA: Diagnosis not present

## 2021-02-24 DIAGNOSIS — I10 Essential (primary) hypertension: Secondary | ICD-10-CM | POA: Diagnosis not present

## 2021-02-24 DIAGNOSIS — I499 Cardiac arrhythmia, unspecified: Secondary | ICD-10-CM | POA: Diagnosis not present

## 2021-02-24 DIAGNOSIS — R011 Cardiac murmur, unspecified: Secondary | ICD-10-CM | POA: Insufficient documentation

## 2021-02-24 DIAGNOSIS — F17290 Nicotine dependence, other tobacco product, uncomplicated: Secondary | ICD-10-CM | POA: Insufficient documentation

## 2021-02-24 DIAGNOSIS — R000 Tachycardia, unspecified: Secondary | ICD-10-CM | POA: Insufficient documentation

## 2021-02-24 DIAGNOSIS — Z96653 Presence of artificial knee joint, bilateral: Secondary | ICD-10-CM | POA: Insufficient documentation

## 2021-02-24 DIAGNOSIS — R918 Other nonspecific abnormal finding of lung field: Secondary | ICD-10-CM | POA: Diagnosis not present

## 2021-02-24 DIAGNOSIS — G4489 Other headache syndrome: Secondary | ICD-10-CM | POA: Diagnosis not present

## 2021-02-24 DIAGNOSIS — N400 Enlarged prostate without lower urinary tract symptoms: Secondary | ICD-10-CM | POA: Diagnosis not present

## 2021-02-24 DIAGNOSIS — G9341 Metabolic encephalopathy: Secondary | ICD-10-CM | POA: Diagnosis not present

## 2021-02-24 DIAGNOSIS — I110 Hypertensive heart disease with heart failure: Secondary | ICD-10-CM | POA: Insufficient documentation

## 2021-02-24 DIAGNOSIS — I2510 Atherosclerotic heart disease of native coronary artery without angina pectoris: Secondary | ICD-10-CM | POA: Insufficient documentation

## 2021-02-24 DIAGNOSIS — E782 Mixed hyperlipidemia: Secondary | ICD-10-CM | POA: Diagnosis not present

## 2021-02-24 DIAGNOSIS — M1990 Unspecified osteoarthritis, unspecified site: Secondary | ICD-10-CM | POA: Diagnosis present

## 2021-02-24 DIAGNOSIS — Z20822 Contact with and (suspected) exposure to covid-19: Secondary | ICD-10-CM | POA: Insufficient documentation

## 2021-02-24 DIAGNOSIS — G2581 Restless legs syndrome: Secondary | ICD-10-CM | POA: Diagnosis present

## 2021-02-24 DIAGNOSIS — B955 Unspecified streptococcus as the cause of diseases classified elsewhere: Secondary | ICD-10-CM

## 2021-02-24 DIAGNOSIS — I5043 Acute on chronic combined systolic (congestive) and diastolic (congestive) heart failure: Secondary | ICD-10-CM

## 2021-02-24 DIAGNOSIS — R0689 Other abnormalities of breathing: Secondary | ICD-10-CM | POA: Diagnosis not present

## 2021-02-24 DIAGNOSIS — M48061 Spinal stenosis, lumbar region without neurogenic claudication: Secondary | ICD-10-CM | POA: Diagnosis not present

## 2021-02-24 DIAGNOSIS — Z7982 Long term (current) use of aspirin: Secondary | ICD-10-CM | POA: Diagnosis not present

## 2021-02-24 DIAGNOSIS — R531 Weakness: Secondary | ICD-10-CM | POA: Insufficient documentation

## 2021-02-24 DIAGNOSIS — R6883 Chills (without fever): Secondary | ICD-10-CM | POA: Insufficient documentation

## 2021-02-24 DIAGNOSIS — R0603 Acute respiratory distress: Secondary | ICD-10-CM | POA: Diagnosis not present

## 2021-02-24 DIAGNOSIS — Z5321 Procedure and treatment not carried out due to patient leaving prior to being seen by health care provider: Secondary | ICD-10-CM | POA: Insufficient documentation

## 2021-02-24 DIAGNOSIS — Z7902 Long term (current) use of antithrombotics/antiplatelets: Secondary | ICD-10-CM

## 2021-02-24 DIAGNOSIS — R42 Dizziness and giddiness: Secondary | ICD-10-CM | POA: Diagnosis not present

## 2021-02-24 DIAGNOSIS — I350 Nonrheumatic aortic (valve) stenosis: Secondary | ICD-10-CM | POA: Diagnosis not present

## 2021-02-24 DIAGNOSIS — I471 Supraventricular tachycardia: Secondary | ICD-10-CM | POA: Diagnosis not present

## 2021-02-24 DIAGNOSIS — Z8249 Family history of ischemic heart disease and other diseases of the circulatory system: Secondary | ICD-10-CM

## 2021-02-24 LAB — COMPREHENSIVE METABOLIC PANEL
ALT: 21 U/L (ref 0–44)
AST: 28 U/L (ref 15–41)
Albumin: 4.1 g/dL (ref 3.5–5.0)
Alkaline Phosphatase: 71 U/L (ref 38–126)
Anion gap: 13 (ref 5–15)
BUN: 17 mg/dL (ref 8–23)
CO2: 23 mmol/L (ref 22–32)
Calcium: 9 mg/dL (ref 8.9–10.3)
Chloride: 99 mmol/L (ref 98–111)
Creatinine, Ser: 1.17 mg/dL (ref 0.61–1.24)
GFR, Estimated: >60 mL/min (ref >60)
Glucose, Bld: 147 mg/dL — ABNORMAL HIGH (ref 70–99)
Potassium: 3.3 mmol/L — ABNORMAL LOW (ref 3.5–5.1)
Sodium: 135 mmol/L (ref 135–145)
Total Bilirubin: 1.5 mg/dL — ABNORMAL HIGH (ref 0.3–1.2)
Total Protein: 7.8 g/dL (ref 6.5–8.1)

## 2021-02-24 LAB — URINALYSIS, ROUTINE W REFLEX MICROSCOPIC
Bacteria, UA: NONE SEEN
Bilirubin Urine: NEGATIVE
Glucose, UA: NEGATIVE mg/dL
Ketones, ur: NEGATIVE mg/dL
Leukocytes,Ua: NEGATIVE
Nitrite: NEGATIVE
Protein, ur: NEGATIVE mg/dL
Specific Gravity, Urine: 1.011 (ref 1.005–1.030)
pH: 7 (ref 5.0–8.0)

## 2021-02-24 LAB — RESP PANEL BY RT-PCR (FLU A&B, COVID) ARPGX2
Influenza A by PCR: NEGATIVE
Influenza A by PCR: NEGATIVE
Influenza B by PCR: NEGATIVE
Influenza B by PCR: NEGATIVE
SARS Coronavirus 2 by RT PCR: NEGATIVE
SARS Coronavirus 2 by RT PCR: NEGATIVE

## 2021-02-24 LAB — I-STAT ARTERIAL BLOOD GAS, ED
Acid-base deficit: 1 mmol/L (ref 0.0–2.0)
Bicarbonate: 23.9 mmol/L (ref 20.0–28.0)
Calcium, Ion: 1.16 mmol/L (ref 1.15–1.40)
HCT: 48 % (ref 39.0–52.0)
Hemoglobin: 16.3 g/dL (ref 13.0–17.0)
O2 Saturation: 92 %
Patient temperature: 102.6
Potassium: 3.6 mmol/L (ref 3.5–5.1)
Sodium: 138 mmol/L (ref 135–145)
TCO2: 25 mmol/L (ref 22–32)
pCO2 arterial: 42.6 mmHg (ref 32.0–48.0)
pH, Arterial: 7.366 (ref 7.350–7.450)
pO2, Arterial: 75 mmHg — ABNORMAL LOW (ref 83.0–108.0)

## 2021-02-24 LAB — CBC WITH DIFFERENTIAL/PLATELET
Abs Immature Granulocytes: 0.07 10*3/uL (ref 0.00–0.07)
Basophils Absolute: 0.1 10*3/uL (ref 0.0–0.1)
Basophils Relative: 0 %
Eosinophils Absolute: 0.1 10*3/uL (ref 0.0–0.5)
Eosinophils Relative: 0 %
HCT: 50.1 % (ref 39.0–52.0)
Hemoglobin: 16.4 g/dL (ref 13.0–17.0)
Immature Granulocytes: 0 %
Lymphocytes Relative: 6 %
Lymphs Abs: 1 10*3/uL (ref 0.7–4.0)
MCH: 31.7 pg (ref 26.0–34.0)
MCHC: 32.7 g/dL (ref 30.0–36.0)
MCV: 96.7 fL (ref 80.0–100.0)
Monocytes Absolute: 0.6 10*3/uL (ref 0.1–1.0)
Monocytes Relative: 4 %
Neutro Abs: 14.4 10*3/uL — ABNORMAL HIGH (ref 1.7–7.7)
Neutrophils Relative %: 90 %
Platelets: 223 10*3/uL (ref 150–400)
RBC: 5.18 MIL/uL (ref 4.22–5.81)
RDW: 13.2 % (ref 11.5–15.5)
WBC: 16.2 10*3/uL — ABNORMAL HIGH (ref 4.0–10.5)
nRBC: 0 % (ref 0.0–0.2)

## 2021-02-24 LAB — I-STAT CHEM 8, ED
BUN: 20 mg/dL (ref 8–23)
Calcium, Ion: 0.95 mmol/L — ABNORMAL LOW (ref 1.15–1.40)
Chloride: 119 mmol/L — ABNORMAL HIGH (ref 98–111)
Creatinine, Ser: 0.7 mg/dL (ref 0.61–1.24)
Glucose, Bld: 124 mg/dL — ABNORMAL HIGH (ref 70–99)
HCT: 36 % — ABNORMAL LOW (ref 39.0–52.0)
Hemoglobin: 12.2 g/dL — ABNORMAL LOW (ref 13.0–17.0)
Potassium: 2.7 mmol/L — CL (ref 3.5–5.1)
Sodium: 138 mmol/L (ref 135–145)
TCO2: 21 mmol/L — ABNORMAL LOW (ref 22–32)

## 2021-02-24 LAB — PROTIME-INR
INR: 1.2 (ref 0.8–1.2)
Prothrombin Time: 15.1 seconds (ref 11.4–15.2)

## 2021-02-24 LAB — LACTIC ACID, PLASMA
Lactic Acid, Venous: 2.2 mmol/L (ref 0.5–1.9)
Lactic Acid, Venous: 1.3 mmol/L (ref 0.5–1.9)

## 2021-02-24 LAB — TROPONIN I (HIGH SENSITIVITY)
Troponin I (High Sensitivity): 35 ng/L — ABNORMAL HIGH (ref <18)
Troponin I (High Sensitivity): 112 ng/L (ref <18)

## 2021-02-24 LAB — BRAIN NATRIURETIC PEPTIDE: B Natriuretic Peptide: 630.4 pg/mL — ABNORMAL HIGH (ref 0.0–100.0)

## 2021-02-24 MED ORDER — POLYETHYLENE GLYCOL 3350 17 G PO PACK: 17.0000 g | PACK | Freq: Every day | ORAL | Status: DC | PRN

## 2021-02-24 MED ORDER — ALBUTEROL SULFATE HFA 108 (90 BASE) MCG/ACT IN AERS: 2.0000 | INHALATION_SPRAY | RESPIRATORY_TRACT | Status: DC | PRN

## 2021-02-24 MED ORDER — SODIUM CHLORIDE 0.9 % IV SOLN
2.0000 g | INTRAVENOUS | Status: DC
  Administered 2021-02-24: 2 g via INTRAVENOUS
  Filled 2021-02-24: qty 2

## 2021-02-24 MED ORDER — PANTOPRAZOLE SODIUM 40 MG IV SOLR
40.0000 mg | Freq: Every day | INTRAVENOUS | Status: DC
  Administered 2021-02-25: 40 mg via INTRAVENOUS
  Filled 2021-02-24: qty 40

## 2021-02-24 MED ORDER — POTASSIUM CHLORIDE 10 MEQ/100ML IV SOLN
10.0000 meq | Freq: Once | INTRAVENOUS | Status: AC
  Administered 2021-02-24: 10 meq via INTRAVENOUS
  Filled 2021-02-24: qty 100

## 2021-02-24 MED ORDER — VANCOMYCIN HCL 1750 MG/350ML IV SOLN
1750.0000 mg | Freq: Once | INTRAVENOUS | Status: AC
  Administered 2021-02-24: 1750 mg via INTRAVENOUS
  Filled 2021-02-24: qty 350

## 2021-02-24 MED ORDER — ONDANSETRON HCL 4 MG/2ML IJ SOLN: 4.0000 mg | Freq: Four times a day (QID) | INTRAMUSCULAR | Status: DC | PRN

## 2021-02-24 MED ORDER — VANCOMYCIN HCL IN DEXTROSE 1-5 GM/200ML-% IV SOLN
1000.0000 mg | Freq: Two times a day (BID) | INTRAVENOUS | Status: DC
  Administered 2021-02-25: 1000 mg via INTRAVENOUS
  Filled 2021-02-24: qty 200

## 2021-02-24 MED ORDER — SODIUM CHLORIDE 0.9 % IV SOLN
2.0000 g | Freq: Three times a day (TID) | INTRAVENOUS | Status: DC
  Administered 2021-02-25: 2 g via INTRAVENOUS
  Filled 2021-02-24: qty 2

## 2021-02-24 MED ORDER — ACETAMINOPHEN 500 MG PO TABS: 1000.0000 mg | ORAL_TABLET | Freq: Once | ORAL | Status: DC

## 2021-02-24 MED ORDER — ALBUTEROL SULFATE (2.5 MG/3ML) 0.083% IN NEBU
2.5000 mg | INHALATION_SOLUTION | RESPIRATORY_TRACT | Status: DC | PRN
  Administered 2021-02-24: 2.5 mg via RESPIRATORY_TRACT
  Filled 2021-02-24: qty 3

## 2021-02-24 MED ORDER — VANCOMYCIN HCL 1000 MG/200ML IV SOLN
1000.0000 mg | Freq: Two times a day (BID) | INTRAVENOUS | Status: DC
  Filled 2021-02-24: qty 200

## 2021-02-24 MED ORDER — DOCUSATE SODIUM 100 MG PO CAPS: 100.0000 mg | ORAL_CAPSULE | Freq: Two times a day (BID) | ORAL | Status: DC | PRN

## 2021-02-24 MED ORDER — ACETAMINOPHEN 325 MG PO TABS
650.0000 mg | ORAL_TABLET | Freq: Once | ORAL | Status: AC | PRN
  Administered 2021-02-24: 650 mg via ORAL
  Filled 2021-02-24: qty 2

## 2021-02-24 MED ORDER — ENOXAPARIN SODIUM 40 MG/0.4ML IJ SOSY
40.0000 mg | PREFILLED_SYRINGE | Freq: Every day | INTRAMUSCULAR | Status: DC
  Administered 2021-02-25: 40 mg via SUBCUTANEOUS
  Filled 2021-02-24: qty 0.4

## 2021-02-24 MED ORDER — FUROSEMIDE 10 MG/ML IJ SOLN
20.0000 mg | Freq: Once | INTRAMUSCULAR | Status: AC
  Administered 2021-02-24: 20 mg via INTRAVENOUS
  Filled 2021-02-24: qty 2

## 2021-02-24 NOTE — ED Provider Notes (Signed)
[facility] EMERGENCY DEPARTMENT Provider Note   CSN: [id number] Arrival date & time: 02/24/21  1937     History Chief Complaint  Patient presents with   Weakness    Ruben Barrera is a 87 y.o. male.  Patient to ED with rigors/chills, body aches, generalized weakness, headache that started last night as he was going to bed and became worse through the night. No vomiting or diarrhea. He denies congestion or cough but reports mild SOB when lying down. No chest pain or abdominal pain.  The history is provided by the patient. No language interpreter was used.  Weakness Associated symptoms: headaches, myalgias and shortness of breath (Mild)   Associated symptoms: no abdominal pain, no cough, no diarrhea, no dysuria and no vomiting       Past Medical History:  Diagnosis Date   Arthritis    Coronary artery disease    Depression    Dysrhythmia    "extra heart beat"   GERD (gastroesophageal reflux disease)    History of hiatal hernia    Hypercholesteremia    Hypertension    Lumbar spinal stenosis    RLS (restless legs syndrome)    takes ativan as needed   Stroke ([place]) 01/2020   minor - no deficits    Patient Active Problem List   Diagnosis Date Noted   S/P insertion of spinal cord stimulator 07/10/2020   Frequent PVCs 04/15/2020   Preop cardiovascular exam 04/15/2020   HFrEF (heart failure with reduced ejection fraction) ([place]) 04/15/2020   SVT (supraventricular tachycardia) ([place]) 04/15/2020   Coronary artery disease involving native coronary artery of native heart without angina pectoris 04/15/2020   Moderate to severe aortic stenosis 03/19/2020   Dyslipidemia    Chronic systolic congestive heart failure ([place])    Acute ischemic cerebrovascular accident (CVA) involving anterior cerebral artery territory ([place]) 02/22/2020   Mixed hyperlipidemia    Tobacco abuse    Depression    Acute stroke due to occlusion of right cerebellar artery ([place]) 02/20/2020    Erythrocytosis 02/20/2020   BPH (benign prostatic hyperplasia) 02/20/2020   HNP (herniated nucleus pulposus), lumbar 01/13/2016   Lumbar stenosis with neurogenic claudication 08/28/2015   DOE (dyspnea on exertion) 05/01/2014   Essential hypertension 05/01/2014   Left shoulder pain 05/05/2011    Past Surgical History:  Procedure Laterality Date   BACK SURGERY     COLONOSCOPY     EYE SURGERY Bilateral    cataracts removed    LUMBAR LAMINECTOMY/DECOMPRESSION MICRODISCECTOMY N/A 08/28/2015   Procedure: Lumbar Four-Five decompressive lumbar laminectomy;  Surgeon: Jovita Gamma, MD;  Location: [facility];  Service: Neurosurgery;  Laterality: N/A;   REPLACEMENT TOTAL KNEE BILATERAL Bilateral 11/05/2003   RIGHT/LEFT HEART CATH AND CORONARY ANGIOGRAPHY N/A 03/28/2020   Procedure: RIGHT/LEFT HEART CATH AND CORONARY ANGIOGRAPHY;  Surgeon: Burnell Blanks, MD;  Location: [place] [facility];  Service: Cardiovascular;  Laterality: N/A;   THORACIC DISCECTOMY N/A 07/10/2020   Procedure: Spinal cord stimulator via  - Thoracic Eight-Thoracic Nine, Thoracic Nine-Thoracic Ten Laminectomy;  Surgeon: Eustace Moore, MD;  Location: [place];  Service: Neurosurgery;  Laterality: N/A;  3C   TOE AMPUTATION Right 2010   second toe   TONSILLECTOMY         Family History  Problem Relation Age of Onset   Stroke Mother    Hypertension Other        family history    Social History   Tobacco Use  Smoking status: Some Days    Types: Cigars   Smokeless tobacco: Never   Tobacco comments:    quit 1972- currently 1 cigar per week  Vaping Use   Vaping Use: Never used  Substance Use Topics   Alcohol use: Yes    Comment: occasionally beer/wine or scotch   Drug use: No    Comment: 1 cigar per week    Home Medications Prior to Admission medications   Medication Sig Start Date End Date Taking? Authorizing Provider  acetaminophen (TYLENOL) 325 MG tablet Take 2 tablets (650 mg total) by mouth every 4  (four) hours as needed for mild pain (or temp > 37.5 C (99.5 F)). 02/26/20   Angiulli, Lavon Paganini, PA-C  aspirin EC 81 MG EC tablet Take 1 tablet (81 mg total) by mouth daily. Swallow whole. 02/22/20   Hosie Poisson, MD  Baclofen 5 MG TABS Take 5 mg by mouth 3 (three) times daily as needed. 04/04/20   Raulkar, Clide Deutscher, MD  celecoxib (CELEBREX) 200 MG capsule Take 200 mg by mouth daily. 03/15/20   [provider]  citalopram (CELEXA) 10 MG tablet Take 1 tablet (10 mg total) by mouth daily. Patient not taking: Reported on 12/06/2020 02/26/20   Angiulli, Lavon Paganini, PA-C  citalopram (CELEXA) 20 MG tablet Take 20 mg by mouth daily.    [provider]  clopidogrel (PLAVIX) 75 MG tablet Take 1 tablet (75 mg total) by mouth daily. Patient not taking: Reported on 12/06/2020 10/15/20   Pieter Partridge, DO  ezetimibe (ZETIA) 10 MG tablet Take 1 tablet (10 mg total) by mouth every evening. 02/26/20   Angiulli, Lavon Paganini, PA-C  fluticasone (FLONASE) 50 MCG/ACT nasal spray Place 1 spray into both nostrils daily as needed for allergies.     [provider]  furosemide (LASIX) 20 MG tablet Take 1 tablet (20 mg total) by mouth daily. 03/21/20   Chandrasekhar, Lyda Kalata A, MD  gabapentin (NEURONTIN) 400 MG capsule Take 400 mg by mouth at bedtime. 03/15/20   [provider]  gabapentin (NEURONTIN) 600 MG tablet Take 1 tablet (600 mg total) by mouth 3 (three) times daily. 02/26/20   Angiulli, Lavon Paganini, PA-C  HYDROcodone-acetaminophen (NORCO) 7.5-325 MG tablet Take 1 tablet by mouth every 8 (eight) hours as needed for moderate pain. 02/26/20   Angiulli, Lavon Paganini, PA-C  loratadine (CLARITIN) 10 MG tablet Take 10 mg by mouth daily as needed.    [provider]  LORazepam (ATIVAN) 1 MG tablet Take 1 tablet (1 mg total) by mouth every 8 (eight) hours as needed for anxiety. 02/26/20   Angiulli, Lavon Paganini, PA-C  Magnesium 250 MG TABS Take 250 mg by mouth daily in the afternoon.    [provider]  metoprolol succinate (TOPROL-XL) 25 MG 24 hr tablet Take 1 tablet by mouth daily. 03/27/20   Rudean Haskell A, MD  MULTIPLE VITAMIN PO Take 1 tablet by mouth daily.     [provider]  omeprazole (PRILOSEC) 20 MG capsule Take 20 mg by mouth daily.    [provider]  pravastatin (PRAVACHOL) 20 MG tablet Take 1 tablet (20 mg total) by mouth daily at 6 PM. 02/26/20   Angiulli, Lavon Paganini, PA-C  saccharomyces boulardii (FLORASTOR) 250 MG capsule Take 250 mg by mouth daily in the afternoon. 05/06/18   [provider]  senna-docusate (SENOKOT-S) 8.6-50 MG tablet Take 1 tablet by mouth 2 (two) times daily. Patient taking differently: Take 2 tablets  by mouth daily. 02/26/20   Angiulli, Lavon Paganini, PA-C  tamsulosin (FLOMAX) 0.4 MG CAPS capsule Take 1 capsule (0.4 mg total) by mouth daily. 02/26/20   Angiulli, Lavon Paganini, PA-C  triamcinolone (KENALOG) 0.1 % Apply 1 application topically daily as needed (irritation).    [provider]    Allergies    Simvastatin and Benazepril hcl  Review of Systems   Review of Systems  Constitutional:  Positive for chills.  HENT:  Negative for congestion.   Respiratory:  Positive for shortness of breath (Mild). Negative for cough.   Gastrointestinal:  Negative for abdominal pain, diarrhea and vomiting.  Genitourinary:  Negative for dysuria.  Musculoskeletal:  Positive for myalgias.  Skin:  Negative for color change and rash.  Neurological:  Positive for weakness and headaches.   Physical Exam Updated Vital Signs BP (!) 193/87   Pulse (!) 105   Temp 100 F (37.8 C) (Oral)   Resp 18   SpO2 96%   Physical Exam Vitals and nursing note reviewed.  Constitutional:      General: He is not in acute distress.    Appearance: He is not toxic-appearing or diaphoretic.     Comments: Rigors  HENT:     Head: Normocephalic.     Mouth/Throat:     Mouth: Mucous membranes are moist.  Cardiovascular:     Rate and  Rhythm: Normal rate and regular rhythm.     Heart sounds: Murmur heard.  Pulmonary:     Breath sounds: No wheezing, rhonchi or rales.  Abdominal:     General: There is no distension.     Palpations: Abdomen is soft.     Tenderness: There is no abdominal tenderness.  Musculoskeletal:        General: Normal range of motion.     Cervical back: Normal range of motion and neck supple.  Skin:    General: Skin is warm and dry.  Neurological:     Mental Status: He is alert and oriented to person, place, and time.     Motor: Weakness (Generalized) present.     Coordination: Coordination normal.    ED Results / Procedures / Treatments   Labs (all labs ordered are listed, but only abnormal results are displayed) Labs Reviewed  RESP PANEL BY RT-PCR (FLU A&B, COVID) [id number]  URINALYSIS, ROUTINE W REFLEX MICROSCOPIC    EKG EKG Interpretation  Date/Time:  Monday February 24 2021 06:11:19 EST Ventricular Rate:  105 PR Interval:  140 QRS Duration: 102 QT Interval:  376 QTC Calculation: 496 R Axis:   -34 Text Interpretation: Sinus tachycardia with frequent Premature ventricular complexes Left axis deviation Cannot rule out Anterior infarct , age undetermined Abnormal ECG Confirmed by Ripley Fraise [id number]) on 02/24/2021 6:28:56 AM  Radiology No results found.  Procedures Procedures   Medications Ordered in ED Medications  acetaminophen (TYLENOL) tablet 1,000 mg (has no administration in time range)    ED Course  I have reviewed the triage vital signs and the nursing notes.  Pertinent labs & imaging results that were available during my care of the patient were reviewed by me and considered in my medical decision making (see chart for details).    MDM Rules/Calculators/A&P                           Patient to ED with onset several hours ago of rigors, detailed symptoms in HPI. Very pleasant gentlemen, oriented, generalized weakness. Febrile  to 100.   Viral panel pending,  CXR, UA. Discussed with Dr. Christy Gentles. If source of infection identified with tests ordered, patient can be discharged without further workup. If current tests negative, feel he will need additional evaluation.  Patient care signed out to [facility], PA-C, pending test results.   Final Clinical Impression(s) / ED Diagnoses Final diagnoses:  None   Chills   Rx / DC Orders ED Discharge Orders     None        Dennie Bible 02/24/21 0715    Ripley Fraise, MD 02/24/21 2302

## 2021-02-24 NOTE — Progress Notes (Signed)
RT set up patient on BIPAP at [place]. Patient is tolerating settings well. RT will monitor as needed.

## 2021-02-24 NOTE — ED Notes (Signed)
Pt has new onset AMS and is increasingly slow to respond to verbal stimuli. Increased work of breathing noted since arrival with significant dyspnea and accessory muscle use with rapid desaturation to low 80s when oxygen tubing is displaced from nares. Pt is also only sporadically responding to questions which is a clear change from status on arrival. MD aware and at bedside speaking with pt and wife. RN placed call to respiratory per MD to request BiPap support ASAP.

## 2021-02-24 NOTE — ED Triage Notes (Addendum)
Pt was at home and tonight started getting weak, with headache, chills, fatigue. Pt said he feels like he is freezing. Pt was unsteady on his feet when we stood him to urinate. No blurred vision, no SOB. Fever in triage is 100.0

## 2021-02-24 NOTE — ED Triage Notes (Signed)
Brought by EMS from home c/o worsening weakness, nausea, vomiting, fever over the past few days. He was here this morning but LWBS; wife found him down at home and called EMS. Pt is a/o and says his legs went out from under him so he sat in the floor. Denies fall and denies injury. EMS reports 94% saturation on room air en route but placed pt on 2L O2 for PVCs/PACs. On arrival pt is notably SOB with audible expiratory wheezes and tachypnea, accessory muscle use, tachycardic in 140s. T 102.6 oral on arrival. Obtained flu/covid swab on arrival

## 2021-02-24 NOTE — ED Provider Notes (Signed)
[facility] EMERGENCY DEPARTMENT Provider Note   CSN: [id number] Arrival date & time: 02/24/21  1820     History Chief Complaint  Patient presents with   Shortness of Breath   Fever   Weakness    Ruben Barrera is a 87 y.o. male.  87 year old male with prior medical history as detailed below presents for evaluation.  Patient presented earlier today with same complaint.  Due to wait time he left prior to evaluation.  Patient reportedly became more uncomfortable through the afternoon.  Patient's wife reports temperatures of 102-103 at home.  Patient developed rigors and chills.  Patient's symptoms started early this morning.  Patient's wife reports that he became so weak that he could not ambulate this afternoon.  Patient denies significant shortness of breath.  Patient denies headache or chest pain.  Patient is diaphoretic on initial exam.  The history is provided by the patient, a relative and medical records.  Fever Max temp prior to arrival:  102-103 Temp source:  Oral Severity:  Severe Onset quality:  Gradual Duration:  1 day Timing:  Constant Progression:  Worsening Chronicity:  New     Past Medical History:  Diagnosis Date   Arthritis    Coronary artery disease    Depression    Dysrhythmia    "extra heart beat"   GERD (gastroesophageal reflux disease)    History of hiatal hernia    Hypercholesteremia    Hypertension    Lumbar spinal stenosis    RLS (restless legs syndrome)    takes ativan as needed   Stroke ([place]) 01/2020   minor - no deficits    Patient Active Problem List   Diagnosis Date Noted   S/P insertion of spinal cord stimulator 07/10/2020   Frequent PVCs 04/15/2020   Preop cardiovascular exam 04/15/2020   HFrEF (heart failure with reduced ejection fraction) ([place]) 04/15/2020   SVT (supraventricular tachycardia) ([place]) 04/15/2020   Coronary artery disease involving native coronary artery of native heart without angina  pectoris 04/15/2020   Moderate to severe aortic stenosis 03/19/2020   Dyslipidemia    Chronic systolic congestive heart failure ([place])    Acute ischemic cerebrovascular accident (CVA) involving anterior cerebral artery territory ([place]) 02/22/2020   Mixed hyperlipidemia    Tobacco abuse    Depression    Acute stroke due to occlusion of right cerebellar artery ([place]) 02/20/2020   Erythrocytosis 02/20/2020   BPH (benign prostatic hyperplasia) 02/20/2020   HNP (herniated nucleus pulposus), lumbar 01/13/2016   Lumbar stenosis with neurogenic claudication 08/28/2015   DOE (dyspnea on exertion) 05/01/2014   Essential hypertension 05/01/2014   Left shoulder pain 05/05/2011    Past Surgical History:  Procedure Laterality Date   BACK SURGERY     COLONOSCOPY     EYE SURGERY Bilateral    cataracts removed    LUMBAR LAMINECTOMY/DECOMPRESSION MICRODISCECTOMY N/A 08/28/2015   Procedure: Lumbar Four-Five decompressive lumbar laminectomy;  Surgeon: Jovita Gamma, MD;  Location: [facility];  Service: Neurosurgery;  Laterality: N/A;   REPLACEMENT TOTAL KNEE BILATERAL Bilateral 11/05/2003   RIGHT/LEFT HEART CATH AND CORONARY ANGIOGRAPHY N/A 03/28/2020   Procedure: RIGHT/LEFT HEART CATH AND CORONARY ANGIOGRAPHY;  Surgeon: Burnell Blanks, MD;  Location: [place];  Service: Cardiovascular;  Laterality: N/A;   THORACIC DISCECTOMY N/A 07/10/2020   Procedure: Spinal cord stimulator via  - Thoracic Eight-Thoracic Nine, Thoracic Nine-Thoracic Ten Laminectomy;  Surgeon: Eustace Moore, MD;  Location: [facility];  Service: Neurosurgery;  Laterality: N/A;  3C   TOE AMPUTATION Right 2010   second toe   TONSILLECTOMY         Family History  Problem Relation Age of Onset   Stroke Mother    Hypertension Other        family history    Social History   Tobacco Use   Smoking status: Some Days    Types: Cigars   Smokeless tobacco: Never   Tobacco comments:    quit 1972- currently 1 cigar per  week  Vaping Use   Vaping Use: Never used  Substance Use Topics   Alcohol use: Yes    Comment: occasionally beer/wine or scotch   Drug use: No    Comment: 1 cigar per week    Home Medications Prior to Admission medications   Medication Sig Start Date End Date Taking? Authorizing Provider  acetaminophen (TYLENOL) 325 MG tablet Take 2 tablets (650 mg total) by mouth every 4 (four) hours as needed for mild pain (or temp > 37.5 C (99.5 F)). 02/26/20   Angiulli, Lavon Paganini, PA-C  aspirin EC 81 MG EC tablet Take 1 tablet (81 mg total) by mouth daily. Swallow whole. 02/22/20   Hosie Poisson, MD  Baclofen 5 MG TABS Take 5 mg by mouth 3 (three) times daily as needed. 04/04/20   Raulkar, Clide Deutscher, MD  celecoxib (CELEBREX) 200 MG capsule Take 200 mg by mouth daily. 03/15/20   [provider]  citalopram (CELEXA) 10 MG tablet Take 1 tablet (10 mg total) by mouth daily. Patient not taking: Reported on 12/06/2020 02/26/20   Angiulli, Lavon Paganini, PA-C  citalopram (CELEXA) 20 MG tablet Take 20 mg by mouth daily.    [provider]  clopidogrel (PLAVIX) 75 MG tablet Take 1 tablet (75 mg total) by mouth daily. Patient not taking: Reported on 12/06/2020 10/15/20   Pieter Partridge, DO  ezetimibe (ZETIA) 10 MG tablet Take 1 tablet (10 mg total) by mouth every evening. 02/26/20   Angiulli, Lavon Paganini, PA-C  fluticasone (FLONASE) 50 MCG/ACT nasal spray Place 1 spray into both nostrils daily as needed for allergies.     [provider]  furosemide (LASIX) 20 MG tablet Take 1 tablet (20 mg total) by mouth daily. 03/21/20   Chandrasekhar, Lyda Kalata A, MD  gabapentin (NEURONTIN) 400 MG capsule Take 400 mg by mouth at bedtime. 03/15/20   [provider]  gabapentin (NEURONTIN) 600 MG tablet Take 1 tablet (600 mg total) by mouth 3 (three) times daily. 02/26/20   Angiulli, Lavon Paganini, PA-C  HYDROcodone-acetaminophen (NORCO) 7.5-325 MG tablet Take 1 tablet by mouth every 8 (eight) hours as needed for  moderate pain. 02/26/20   Angiulli, Lavon Paganini, PA-C  loratadine (CLARITIN) 10 MG tablet Take 10 mg by mouth daily as needed.    [provider]  LORazepam (ATIVAN) 1 MG tablet Take 1 tablet (1 mg total) by mouth every 8 (eight) hours as needed for anxiety. 02/26/20   Angiulli, Lavon Paganini, PA-C  Magnesium 250 MG TABS Take 250 mg by mouth daily in the afternoon.    [provider]  metoprolol succinate (TOPROL-XL) 25 MG 24 hr tablet Take 1 tablet by mouth daily. 03/27/20   Rudean Haskell A, MD  MULTIPLE VITAMIN PO Take 1 tablet by mouth daily.     [provider]  omeprazole (PRILOSEC) 20 MG capsule Take 20 mg by mouth daily.    [provider]  pravastatin (PRAVACHOL) 20 MG tablet  Take 1 tablet (20 mg total) by mouth daily at 6 PM. 02/26/20   Angiulli, Lavon Paganini, PA-C  saccharomyces boulardii (FLORASTOR) 250 MG capsule Take 250 mg by mouth daily in the afternoon. 05/06/18   [provider]  senna-docusate (SENOKOT-S) 8.6-50 MG tablet Take 1 tablet by mouth 2 (two) times daily. Patient taking differently: Take 2 tablets by mouth daily. 02/26/20   Angiulli, Lavon Paganini, PA-C  tamsulosin (FLOMAX) 0.4 MG CAPS capsule Take 1 capsule (0.4 mg total) by mouth daily. 02/26/20   Angiulli, Lavon Paganini, PA-C  triamcinolone (KENALOG) 0.1 % Apply 1 application topically daily as needed (irritation).    [provider]    Allergies    Simvastatin and Benazepril hcl  Review of Systems   Review of Systems  Unable to perform ROS: Acuity of condition   Physical Exam Updated Vital Signs BP 106/62   Pulse (!) 53   Temp (!) 102.6 F (39.2 C) (Oral)   Resp (!) 24   Ht 5\' 8"  (1.727 m)   Wt 82.6 kg   SpO2 98%   BMI 27.67 kg/m   Physical Exam Vitals and nursing note reviewed.  Constitutional:      General: He is in acute distress.     Appearance: Normal appearance. He is well-developed. He is ill-appearing.  HENT:     Head: Normocephalic and atraumatic.   Eyes:     Conjunctiva/sclera: Conjunctivae normal.     Pupils: Pupils are equal, round, and reactive to light.  Cardiovascular:     Rate and Rhythm: Regular rhythm. Tachycardia present.     Heart sounds: Normal heart sounds.  Pulmonary:     Effort: Tachypnea and accessory muscle usage present. No respiratory distress.     Breath sounds: Examination of the right-lower field reveals decreased breath sounds. Examination of the left-lower field reveals decreased breath sounds. Decreased breath sounds present.  Abdominal:     General: There is no distension.     Palpations: Abdomen is soft.     Tenderness: There is no abdominal tenderness.  Musculoskeletal:        General: No deformity. Normal range of motion.     Cervical back: Normal range of motion and neck supple.  Skin:    General: Skin is warm and dry.  Neurological:     General: No focal deficit present.     Mental Status: He is alert.    ED Results / Procedures / Treatments   Labs (all labs ordered are listed, but only abnormal results are displayed) Labs Reviewed  COMPREHENSIVE METABOLIC PANEL - Abnormal; Notable for the following components:      Result Value   Potassium 3.3 (*)    Glucose, Bld 147 (*)    Total Bilirubin 1.5 (*)    All other components within normal limits  LACTIC ACID, PLASMA - Abnormal; Notable for the following components:   Lactic Acid, Venous 2.2 (*)    All other components within normal limits  CBC WITH DIFFERENTIAL/PLATELET - Abnormal; Notable for the following components:   WBC 16.2 (*)    Neutro Abs 14.4 (*)    All other components within normal limits  BRAIN NATRIURETIC PEPTIDE - Abnormal; Notable for the following components:   B Natriuretic Peptide 630.4 (*)    All other components within normal limits  I-STAT CHEM 8, ED - Abnormal; Notable for the following components:   Potassium 2.7 (*)    Chloride 119 (*)    Glucose, Bld 124 (*)  Calcium, Ion 0.95 (*)    TCO2 21 (*)     Hemoglobin 12.2 (*)    HCT 36.0 (*)    All other components within normal limits  I-STAT ARTERIAL BLOOD GAS, ED - Abnormal; Notable for the following components:   pO2, Arterial 75 (*)    All other components within normal limits  TROPONIN I (HIGH SENSITIVITY) - Abnormal; Notable for the following components:   Troponin I (High Sensitivity) 35 (*)    All other components within normal limits  RESP PANEL BY RT-PCR (FLU A&B, COVID) ARPGX2  CULTURE, BLOOD (ROUTINE X 2)  CULTURE, BLOOD (ROUTINE X 2)  PROTIME-INR  LACTIC ACID, PLASMA  URINALYSIS, ROUTINE W REFLEX MICROSCOPIC  TROPONIN I (HIGH SENSITIVITY)    EKG EKG Interpretation  Date/Time:  Monday February 24 2021 18:45:05 EST Ventricular Rate:  140 PR Interval:  80 QRS Duration: 128 QT Interval:  336 QTC Calculation: 513 R Axis:   -66 Text Interpretation: Sinus tachycardia Paired ventricular premature complexes Nonspecific IVCD with LAD LVH with secondary repolarization abnormality Lateral infarct, age indeterminate Confirmed by Dene Gentry ([id number]) on 02/24/2021 7:28:19 PM  Radiology DG Chest 2 View  Result Date: 02/24/2021 CLINICAL DATA:  Shortness of breath and fevers. EXAM: CHEST - 2 VIEW COMPARISON:  07/10/2020 FINDINGS: Dorsal column stimulator is noted with lead terminating in the lower thoracic canal. Mild cardiac enlargement, unchanged. Diffusely increased interstitial markings are noted bilaterally. No pleural effusion. No focal airspace opacities. There are degenerative changes involving both glenohumeral joints. IMPRESSION: Diffusely increased interstitial markings compatible with mild CHF. Electronically Signed   By: Kerby Moors M.D.   On: 02/24/2021 06:55   DG Chest Port 1 View  Result Date: 02/24/2021 CLINICAL DATA:  Fever EXAM: PORTABLE CHEST 1 VIEW COMPARISON:  02/24/2021, 07/10/2020 FINDINGS: Cardiomegaly with mild diffuse interstitial opacities suggestive of interstitial edema. No consolidation, pleural  effusion or pneumothorax. Aortic atherosclerosis. Ascending thoracic stimulator leads. IMPRESSION: Cardiomegaly with mild diffuse interstitial opacity most likely representing edema Electronically Signed   By: Donavan Foil M.D.   On: 02/24/2021 19:25    Procedures Procedures   Medications Ordered in ED Medications  albuterol (PROVENTIL) (2.5 MG/3ML) 0.083% nebulizer solution 2.5 mg (2.5 mg Nebulization Given 02/24/21 1904)  ceFEPIme (MAXIPIME) 2 g in sodium chloride 0.9 % 100 mL IVPB (0 g Intravenous Stopped 02/24/21 1955)  vancomycin (VANCOREADY) IVPB 1750 mg/350 mL (1,750 mg Intravenous New Bag/Given 02/24/21 1943)  potassium chloride 10 mEq in 100 mL IVPB (has no administration in time range)  ceFEPIme (MAXIPIME) 2 g in sodium chloride 0.9 % 100 mL IVPB (has no administration in time range)  vancomycin (VANCOCIN) IVPB 1000 mg/200 mL premix (has no administration in time range)  acetaminophen (TYLENOL) tablet 650 mg (650 mg Oral Given 02/24/21 1916)  furosemide (LASIX) injection 20 mg (20 mg Intravenous Given 02/24/21 2010)    ED Course  I have reviewed the triage vital signs and the nursing notes.  Pertinent labs & imaging results that were available during my care of the patient were reviewed by me and considered in my medical decision making (see chart for details).    MDM Rules/Calculators/A&P                             CRITICAL CARE Performed by: Valarie Merino   Total critical care time: 45 minutes  Critical care time was exclusive of separately billable procedures and treating other patients.  Critical care was necessary to treat or prevent imminent or life-threatening deterioration.  Critical care was time spent personally by me on the following activities: development of treatment plan with patient and/or surrogate as well as nursing, discussions with consultants, evaluation of patient's response to treatment, examination of patient, obtaining history from patient or  surrogate, ordering and performing treatments and interventions, ordering and review of laboratory studies, ordering and review of radiographic studies, pulse oximetry and re-evaluation of patient's condition.   MDM  MSE complete  Ruben Barrera was evaluated in Emergency Department on 02/24/2021 for the symptoms described in the history of present illness. He was evaluated in the context of the global COVID-19 pandemic, which necessitated consideration that the patient might be at risk for infection with the SARS-CoV-2 virus that causes COVID-19. Institutional protocols and algorithms that pertain to the evaluation of patients at risk for COVID-19 are in a state of rapid change based on information released by regulatory bodies including the CDC and federal and state organizations. These policies and algorithms were followed during the patient's care in the ED.   Patient presented with complaint of fever.  Patient noted to be in significant distress on arrival with diaphoresis, tachycardia, fever, tachypnea, and hypertension.  BiPAP initiated. Code sepsis initiated with broad-spectrum antibiotics administered.   Patient is improved after brief period of observation.  However, patient with concerning initial presentation.  Critical care consulted regarding case.  They agree that the patient may worsen.  Critical care will admit.     Final Clinical Impression(s) / ED Diagnoses Final diagnoses:  Fever, unspecified fever cause    Rx / DC Orders ED Discharge Orders     None        Valarie Merino, MD 02/24/21 2158

## 2021-02-24 NOTE — Progress Notes (Signed)
Pharmacy Antibiotic Note  Ruben Barrera is a 87 y.o. male admitted on 02/24/2021 with sepsis.  Pharmacy has been consulted for Cefepime and vancomycin dosing.  WBC and LA elevated. Tm 102.52F. SCr wnl   Plan: -Cefepime 2 gm IV Q 8 hours -Vancomycin 1750 mg Ioad followed by Vancomycin 1000 mg IV Q 12 hrs. Goal AUC 400-550. Expected AUC: 519 SCr used: 0.7 -Monitor CBC, renal fx, cultures and clinical progress -Vanc levels as indicated    Height: 5\' 8"  (172.7 cm) Weight: 82.6 kg (182 lb) IBW/kg (Calculated) : 68.4  Temp (24hrs), Avg:101.3 F (38.5 C), Min:100 F (37.8 C), Max:102.6 F (39.2 C)  Recent Labs  Lab 02/24/21 1850 02/24/21 2012  WBC 16.2*  --   CREATININE  --  0.70    Estimated Creatinine Clearance: 69.5 mL/min (by C-G formula based on SCr of 0.7 mg/dL).    Allergies  Allergen Reactions   Simvastatin Other (See Comments)    Leg pains    Benazepril Hcl Other (See Comments)    Antimicrobials this admission: Cefepime 12/5 >>  Vancomycin 12/5 >>   Dose adjustments this admission:  Microbiology results: 12/5 BCx:    Thank you for allowing pharmacy to be a part of this patient's care.  Albertina Parr, PharmD., BCPS, BCCCP Clinical Pharmacist Please refer to [facility] for unit-specific pharmacist

## 2021-02-24 NOTE — ED Notes (Signed)
Pt stated that he was leaving.

## 2021-02-24 NOTE — Sepsis Progress Note (Signed)
Following per sepsis protocol   

## 2021-02-24 NOTE — ED Notes (Signed)
Notified provider of critical troponin

## 2021-02-24 NOTE — H&P (Signed)
NAME:  Ruben Barrera, MRN:  [id number], DOB:  [birth year], LOS: 0 ADMISSION DATE:  02/24/2021, CONSULTATION DATE:  02/24/2021 REFERRING MD: Emergency room, CHIEF COMPLAINT: Acute respiratory failure  History of Present Illness:  This is an 87 year old white male that presented to the emergency room from home.  The patient initially presented at 0500 hrs. on 02/24/2021 to the emergency room for fever of 103.5, nausea, generally not feeling well.  The patient felt he was waiting too long and left the emergency room.  Then on the evening of 1205/22 the patient became tachypneic and developed respiratory distress.  He remained febrile and had also been very nauseous through the afternoon.  He vomited multiple times with any p.o. intake.  Pertinent  Medical History  HFrEF Nonobstructive CAD AS Right CVA and recurrent TIA  Significant Hospital Events: Including procedures, antibiotic start and stop dates in addition to other pertinent events     Objective   Blood pressure 110/73, pulse 85, temperature (!) 101.6 F (38.7 C), temperature source Rectal, resp. rate 16, height 5\' 8"  (1.727 m), weight 82.6 kg, SpO2 97 %.    FiO2 (%):  [40 %] 40 %   Intake/Output Summary (Last 24 hours) at 02/24/2021 2327 Last data filed at 02/24/2021 2150 Gross per 24 hour  Intake 950 ml  Output --  Net 950 ml   Filed Weights   02/24/21 1838  Weight: 82.6 kg    Examination: General: No acute distress on BiPAP. HENT: Atraumatic/normocephalic mucous membranes are moist Lungs: Clear to auscultation no wheezing rales or rhonchi noted. Cardiovascular: Regular rate no murmur rub or gallop Abdomen: Soft, nondistended, no rebound/rigidity/guarding. Extremities: Distal pulses intact x4.  No significant cyanosis or edema.  Positive clubbing. Neuro: Patient is lethargic but does awaken to verbal stimuli.  Answers questions but when allowed to go back to sleep he immediately does so.  Generally very weak in all 4  extremities.  Nonfocal.   Assessment & Plan:  Acute respiratory failure Community-acquired pneumonia most consistent with viral infection Metabolic encephalopathy Acute on chronic renal insufficiency  Plan: Patient be admitted to the intensive care unit for further work-up. Continue with BiPAP.  Patient clinically is improving significantly. Since initiation of BiPAP the patient's become significantly more awake and does wake up to answer questions and then when left alone goes right back to sleep. Patient was started on broad-spectrum antibiotics in the emergency room this will be continued. N.p.o. until neurologic status significantly improves. Monitor I/os. Electrolyte protocol.  Replete potassium.  Best Practice (right click and "Reselect all SmartList Selections" daily)   Diet/type: NPO DVT prophylaxis: LMWH GI prophylaxis: PPI Lines: N/A Foley:  N/A Code Status:  full code Last date of multidisciplinary goals of care discussion  Labs   CBC: Recent Labs  Lab 02/24/21 1850 02/24/21 1921 02/24/21 2012  WBC 16.2*  --   --   NEUTROABS 14.4*  --   --   HGB 16.4 16.3 12.2*  HCT 50.1 48.0 36.0*  MCV 96.7  --   --   PLT 223  --   --     Basic Metabolic Panel: Recent Labs  Lab 02/24/21 1850 02/24/21 1921 02/24/21 2012  NA 135 138 138  K 3.3* 3.6 2.7*  CL 99  --  119*  CO2 23  --   --   GLUCOSE 147*  --  124*  BUN 17  --  20  CREATININE 1.17  --  0.70  CALCIUM 9.0  --   --  GFR: Estimated Creatinine Clearance: 69.5 mL/min (by C-G formula based on SCr of 0.7 mg/dL). Recent Labs  Lab 02/24/21 1850 02/24/21 1859 02/24/21 2115  WBC 16.2*  --   --   LATICACIDVEN  --  2.2* 1.3    Liver Function Tests: Recent Labs  Lab 02/24/21 1850  AST 28  ALT 21  ALKPHOS 71  BILITOT 1.5*  PROT 7.8  ALBUMIN 4.1   No results for input(s): LIPASE, AMYLASE in the last 168 hours. No results for input(s): AMMONIA in the last 168 hours.  ABG    Component Value  Date/Time   PHART 7.366 02/24/2021 1921   PCO2ART 42.6 02/24/2021 1921   PO2ART 75 (L) 02/24/2021 1921   HCO3 23.9 02/24/2021 1921   TCO2 21 (L) 02/24/2021 2012   ACIDBASEDEF 1.0 02/24/2021 1921   O2SAT 92.0 02/24/2021 1921     Coagulation Profile: Recent Labs  Lab 02/24/21 1850  INR 1.2    Cardiac Enzymes: No results for input(s): CKTOTAL, CKMB, CKMBINDEX, TROPONINI in the last 168 hours.  HbA1C: Hgb A1c MFr Bld  Date/Time Value Ref Range Status  02/21/2020 02:53 AM 5.6 4.8 - 5.6 % Final    Comment:    (NOTE) Pre diabetes:          5.7%-6.4%  Diabetes:              >6.4%  Glycemic control for   <7.0% adults with diabetes     CBG: No results for input(s): GLUCAP in the last 168 hours.  Review of Systems:   unable to be completed secondary to patient's underlying neurologic status  Past Medical History:  He,  has a past medical history of Arthritis, Coronary artery disease, Depression, Dysrhythmia, GERD (gastroesophageal reflux disease), History of hiatal hernia, Hypercholesteremia, Hypertension, Lumbar spinal stenosis, RLS (restless legs syndrome), and Stroke ([place]) (01/2020).   Surgical History:   Past Surgical History:  Procedure Laterality Date   BACK SURGERY     COLONOSCOPY     EYE SURGERY Bilateral    cataracts removed    LUMBAR LAMINECTOMY/DECOMPRESSION MICRODISCECTOMY N/A 08/28/2015   Procedure: Lumbar Four-Five decompressive lumbar laminectomy;  Surgeon: Jovita Gamma, MD;  Location: [facility];  Service: Neurosurgery;  Laterality: N/A;   REPLACEMENT TOTAL KNEE BILATERAL Bilateral 11/05/2003   RIGHT/LEFT HEART CATH AND CORONARY ANGIOGRAPHY N/A 03/28/2020   Procedure: RIGHT/LEFT HEART CATH AND CORONARY ANGIOGRAPHY;  Surgeon: Burnell Blanks, MD;  Location: [facility];  Service: Cardiovascular;  Laterality: N/A;   THORACIC DISCECTOMY N/A 07/10/2020   Procedure: Spinal cord stimulator via  - Thoracic Eight-Thoracic Nine, Thoracic  Nine-Thoracic Ten Laminectomy;  Surgeon: Eustace Moore, MD;  Location: [facility];  Service: Neurosurgery;  Laterality: N/A;  3C   TOE AMPUTATION Right 2010   second toe   TONSILLECTOMY       Social History:   reports that he has been smoking cigars. He has never used smokeless tobacco. He reports current alcohol use. He reports that he does not use drugs.   Family History:  His family history includes Hypertension in an other family member; Stroke in his mother.   Allergies Allergies  Allergen Reactions   Simvastatin Other (See Comments)    Leg pains    Benazepril Hcl Other (See Comments)     Home Medications  Prior to Admission medications   Medication Sig Start Date End Date Taking? Authorizing Provider  acetaminophen (TYLENOL) 325 MG tablet Take 2 tablets (650 mg total) by  mouth every 4 (four) hours as needed for mild pain (or temp > 37.5 C (99.5 F)). 02/26/20   Angiulli, Lavon Paganini, PA-C  aspirin EC 81 MG EC tablet Take 1 tablet (81 mg total) by mouth daily. Swallow whole. 02/22/20   Hosie Poisson, MD  Baclofen 5 MG TABS Take 5 mg by mouth 3 (three) times daily as needed. 04/04/20   Raulkar, Clide Deutscher, MD  celecoxib (CELEBREX) 200 MG capsule Take 200 mg by mouth daily. 03/15/20   [provider]  citalopram (CELEXA) 10 MG tablet Take 1 tablet (10 mg total) by mouth daily. Patient not taking: Reported on 12/06/2020 02/26/20   Angiulli, Lavon Paganini, PA-C  citalopram (CELEXA) 20 MG tablet Take 20 mg by mouth daily.    [provider]  clopidogrel (PLAVIX) 75 MG tablet Take 1 tablet (75 mg total) by mouth daily. Patient not taking: Reported on 12/06/2020 10/15/20   Pieter Partridge, DO  ezetimibe (ZETIA) 10 MG tablet Take 1 tablet (10 mg total) by mouth every evening. 02/26/20   Angiulli, Lavon Paganini, PA-C  fluticasone (FLONASE) 50 MCG/ACT nasal spray Place 1 spray into both nostrils daily as needed for allergies.     [provider]  furosemide (LASIX) 20 MG tablet Take 1  tablet (20 mg total) by mouth daily. 03/21/20   Chandrasekhar, Lyda Kalata A, MD  gabapentin (NEURONTIN) 400 MG capsule Take 400 mg by mouth at bedtime. 03/15/20   [provider]  gabapentin (NEURONTIN) 600 MG tablet Take 1 tablet (600 mg total) by mouth 3 (three) times daily. 02/26/20   Angiulli, Lavon Paganini, PA-C  HYDROcodone-acetaminophen (NORCO) 7.5-325 MG tablet Take 1 tablet by mouth every 8 (eight) hours as needed for moderate pain. 02/26/20   Angiulli, Lavon Paganini, PA-C  loratadine (CLARITIN) 10 MG tablet Take 10 mg by mouth daily as needed.    [provider]  LORazepam (ATIVAN) 1 MG tablet Take 1 tablet (1 mg total) by mouth every 8 (eight) hours as needed for anxiety. 02/26/20   Angiulli, Lavon Paganini, PA-C  Magnesium 250 MG TABS Take 250 mg by mouth daily in the afternoon.    [provider]  metoprolol succinate (TOPROL-XL) 25 MG 24 hr tablet Take 1 tablet by mouth daily. 03/27/20   Rudean Haskell A, MD  MULTIPLE VITAMIN PO Take 1 tablet by mouth daily.     [provider]  omeprazole (PRILOSEC) 20 MG capsule Take 20 mg by mouth daily.    [provider]  pravastatin (PRAVACHOL) 20 MG tablet Take 1 tablet (20 mg total) by mouth daily at 6 PM. 02/26/20   Angiulli, Lavon Paganini, PA-C  saccharomyces boulardii (FLORASTOR) 250 MG capsule Take 250 mg by mouth daily in the afternoon. 05/06/18   [provider]  senna-docusate (SENOKOT-S) 8.6-50 MG tablet Take 1 tablet by mouth 2 (two) times daily. Patient taking differently: Take 2 tablets by mouth daily. 02/26/20   Angiulli, Lavon Paganini, PA-C  tamsulosin (FLOMAX) 0.4 MG CAPS capsule Take 1 capsule (0.4 mg total) by mouth daily. 02/26/20   Angiulli, Lavon Paganini, PA-C  triamcinolone (KENALOG) 0.1 % Apply 1 application topically daily as needed (irritation).    [provider]     Critical care time: 27mins

## 2021-02-25 ENCOUNTER — Inpatient Hospital Stay (HOSPITAL_COMMUNITY): Payer: PPO

## 2021-02-25 DIAGNOSIS — J9600 Acute respiratory failure, unspecified whether with hypoxia or hypercapnia: Secondary | ICD-10-CM

## 2021-02-25 DIAGNOSIS — R0603 Acute respiratory distress: Secondary | ICD-10-CM | POA: Diagnosis not present

## 2021-02-25 LAB — BASIC METABOLIC PANEL
Anion gap: 12 (ref 5–15)
Anion gap: 10 (ref 5–15)
Anion gap: 8 (ref 5–15)
BUN: 24 mg/dL — ABNORMAL HIGH (ref 8–23)
BUN: 33 mg/dL — ABNORMAL HIGH (ref 8–23)
BUN: 33 mg/dL — ABNORMAL HIGH (ref 8–23)
CO2: 21 mmol/L — ABNORMAL LOW (ref 22–32)
CO2: 26 mmol/L (ref 22–32)
CO2: 26 mmol/L (ref 22–32)
Calcium: 8.3 mg/dL — ABNORMAL LOW (ref 8.9–10.3)
Calcium: 8 mg/dL — ABNORMAL LOW (ref 8.9–10.3)
Calcium: 8.1 mg/dL — ABNORMAL LOW (ref 8.9–10.3)
Chloride: 101 mmol/L (ref 98–111)
Chloride: 99 mmol/L (ref 98–111)
Chloride: 100 mmol/L (ref 98–111)
Creatinine, Ser: 1.32 mg/dL — ABNORMAL HIGH (ref 0.61–1.24)
Creatinine, Ser: 1.61 mg/dL — ABNORMAL HIGH (ref 0.61–1.24)
Creatinine, Ser: 1.43 mg/dL — ABNORMAL HIGH (ref 0.61–1.24)
GFR, Estimated: 53 mL/min — ABNORMAL LOW (ref >60)
GFR, Estimated: 41 mL/min — ABNORMAL LOW (ref >60)
GFR, Estimated: 48 mL/min — ABNORMAL LOW (ref >60)
Glucose, Bld: 142 mg/dL — ABNORMAL HIGH (ref 70–99)
Glucose, Bld: 115 mg/dL — ABNORMAL HIGH (ref 70–99)
Glucose, Bld: 131 mg/dL — ABNORMAL HIGH (ref 70–99)
Potassium: 3.7 mmol/L (ref 3.5–5.1)
Potassium: 4 mmol/L (ref 3.5–5.1)
Potassium: 4.2 mmol/L (ref 3.5–5.1)
Sodium: 134 mmol/L — ABNORMAL LOW (ref 135–145)
Sodium: 135 mmol/L (ref 135–145)
Sodium: 134 mmol/L — ABNORMAL LOW (ref 135–145)

## 2021-02-25 LAB — BLOOD CULTURE ID PANEL (REFLEXED) - BCID2

## 2021-02-25 LAB — RESPIRATORY PANEL BY PCR

## 2021-02-25 LAB — CBC
HCT: 43.9 % (ref 39.0–52.0)
HCT: 44.1 % (ref 39.0–52.0)
Hemoglobin: 14.3 g/dL (ref 13.0–17.0)
Hemoglobin: 14.9 g/dL (ref 13.0–17.0)
MCH: 32 pg (ref 26.0–34.0)
MCH: 32.3 pg (ref 26.0–34.0)
MCHC: 32.6 g/dL (ref 30.0–36.0)
MCHC: 33.8 g/dL (ref 30.0–36.0)
MCV: 95.7 fL (ref 80.0–100.0)
MCV: 98.2 fL (ref 80.0–100.0)
Platelets: 184 10*3/uL (ref 150–400)
Platelets: 190 10*3/uL (ref 150–400)
RBC: 4.47 MIL/uL (ref 4.22–5.81)
RBC: 4.61 MIL/uL (ref 4.22–5.81)
RDW: 13.3 % (ref 11.5–15.5)
RDW: 13.4 % (ref 11.5–15.5)
WBC: 24.2 10*3/uL — ABNORMAL HIGH (ref 4.0–10.5)
WBC: 26.2 10*3/uL — ABNORMAL HIGH (ref 4.0–10.5)
nRBC: 0 % (ref 0.0–0.2)
nRBC: 0 % (ref 0.0–0.2)

## 2021-02-25 LAB — ECHOCARDIOGRAM COMPLETE
AR max vel: 0.45 cm2
AV Area VTI: 0.5 cm2
AV Area mean vel: 0.45 cm2
AV Mean grad: 29 mmHg
AV Peak grad: 43 mmHg
Ao pk vel: 3.28 m/s
Area-P 1/2: 2.68 cm2
Calc EF: 34.5 %
Height: 68 in
S' Lateral: 5.9 cm
Single Plane A2C EF: 36.2 %
Single Plane A4C EF: 35.8 %
Weight: 2952.4 oz

## 2021-02-25 LAB — MAGNESIUM
Magnesium: 2.1 mg/dL (ref 1.7–2.4)
Magnesium: 2.2 mg/dL (ref 1.7–2.4)

## 2021-02-25 LAB — PHOSPHORUS: Phosphorus: 3.3 mg/dL (ref 2.5–4.6)

## 2021-02-25 LAB — PROCALCITONIN: Procalcitonin: 14.96 ng/mL

## 2021-02-25 LAB — GLUCOSE, CAPILLARY
Glucose-Capillary: 143 mg/dL — ABNORMAL HIGH (ref 70–99)
Glucose-Capillary: 144 mg/dL — ABNORMAL HIGH (ref 70–99)
Glucose-Capillary: 118 mg/dL — ABNORMAL HIGH (ref 70–99)

## 2021-02-25 LAB — CREATININE, SERUM
Creatinine, Ser: 1.35 mg/dL — ABNORMAL HIGH (ref 0.61–1.24)
GFR, Estimated: 51 mL/min — ABNORMAL LOW (ref >60)

## 2021-02-25 LAB — MRSA NEXT GEN BY PCR, NASAL: MRSA by PCR Next Gen: NOT DETECTED

## 2021-02-25 MED ORDER — PANTOPRAZOLE SODIUM 40 MG PO TBEC
40.0000 mg | DELAYED_RELEASE_TABLET | Freq: Every day | ORAL | Status: DC
  Administered 2021-02-25 – 2021-03-01 (×5): 40 mg via ORAL
  Filled 2021-02-25 (×5): qty 1

## 2021-02-25 MED ORDER — POTASSIUM CHLORIDE 10 MEQ/100ML IV SOLN
10.0000 meq | Freq: Once | INTRAVENOUS | Status: AC
  Administered 2021-02-25: 10 meq via INTRAVENOUS

## 2021-02-25 MED ORDER — GABAPENTIN 600 MG PO TABS
600.0000 mg | ORAL_TABLET | Freq: Three times a day (TID) | ORAL | Status: DC
  Administered 2021-02-25 – 2021-03-01 (×13): 600 mg via ORAL
  Filled 2021-02-25 (×16): qty 1

## 2021-02-25 MED ORDER — POTASSIUM CHLORIDE 10 MEQ/100ML IV SOLN
INTRAVENOUS | Status: AC
  Filled 2021-02-25: qty 100

## 2021-02-25 MED ORDER — METOPROLOL SUCCINATE ER 25 MG PO TB24
25.0000 mg | ORAL_TABLET | Freq: Every day | ORAL | Status: DC
  Administered 2021-02-25 – 2021-03-01 (×5): 25 mg via ORAL
  Filled 2021-02-25 (×5): qty 1

## 2021-02-25 MED ORDER — HYDROCODONE-ACETAMINOPHEN 7.5-325 MG PO TABS
1.0000 | ORAL_TABLET | Freq: Three times a day (TID) | ORAL | Status: DC | PRN
  Administered 2021-02-25 – 2021-03-01 (×8): 1 via ORAL
  Filled 2021-02-25 (×8): qty 1

## 2021-02-25 MED ORDER — ACETAMINOPHEN 325 MG PO TABS
650.0000 mg | ORAL_TABLET | Freq: Four times a day (QID) | ORAL | Status: DC | PRN
  Administered 2021-02-25: 650 mg via ORAL
  Filled 2021-02-25: qty 2

## 2021-02-25 MED ORDER — ORAL CARE MOUTH RINSE
15.0000 mL | Freq: Two times a day (BID) | OROMUCOSAL | Status: DC
  Administered 2021-02-25 – 2021-03-01 (×8): 15 mL via OROMUCOSAL

## 2021-02-25 MED ORDER — PERFLUTREN LIPID MICROSPHERE
1.0000 mL | INTRAVENOUS | Status: AC | PRN
Start: 2021-02-25 — End: 2021-02-25
  Administered 2021-02-25: 3 mL via INTRAVENOUS
  Filled 2021-02-25: qty 10

## 2021-02-25 MED ORDER — TAMSULOSIN HCL 0.4 MG PO CAPS
0.4000 mg | ORAL_CAPSULE | Freq: Every day | ORAL | Status: DC
  Administered 2021-02-25 – 2021-03-01 (×5): 0.4 mg via ORAL
  Filled 2021-02-25 (×5): qty 1

## 2021-02-25 MED ORDER — SODIUM CHLORIDE 0.9 % IV SOLN
2.0000 g | Freq: Two times a day (BID) | INTRAVENOUS | Status: DC
  Administered 2021-02-25 – 2021-02-26 (×2): 2 g via INTRAVENOUS
  Filled 2021-02-25 (×2): qty 2

## 2021-02-25 MED ORDER — LORAZEPAM 1 MG PO TABS
1.0000 mg | ORAL_TABLET | Freq: Three times a day (TID) | ORAL | Status: DC | PRN
  Administered 2021-02-25: 1 mg via ORAL
  Filled 2021-02-25: qty 1

## 2021-02-25 MED ORDER — CHLORHEXIDINE GLUCONATE 0.12 % MT SOLN
15.0000 mL | Freq: Two times a day (BID) | OROMUCOSAL | Status: DC
  Administered 2021-02-25 – 2021-03-01 (×8): 15 mL via OROMUCOSAL
  Filled 2021-02-25 (×8): qty 15

## 2021-02-25 MED ORDER — PRAVASTATIN SODIUM 40 MG PO TABS
20.0000 mg | ORAL_TABLET | Freq: Every day | ORAL | Status: DC
  Administered 2021-02-25 – 2021-03-01 (×5): 20 mg via ORAL
  Filled 2021-02-25 (×3): qty 1
  Filled 2021-02-25: qty 2
  Filled 2021-02-25: qty 1
  Filled 2021-02-25: qty 2

## 2021-02-25 MED ORDER — CITALOPRAM HYDROBROMIDE 20 MG PO TABS
20.0000 mg | ORAL_TABLET | Freq: Every day | ORAL | Status: DC
  Administered 2021-02-25 – 2021-03-01 (×5): 20 mg via ORAL
  Filled 2021-02-25 (×5): qty 1

## 2021-02-25 MED ORDER — FUROSEMIDE 10 MG/ML IJ SOLN
40.0000 mg | Freq: Once | INTRAMUSCULAR | Status: AC
  Administered 2021-02-25: 40 mg via INTRAVENOUS
  Filled 2021-02-25: qty 4

## 2021-02-25 MED ORDER — CHLORHEXIDINE GLUCONATE CLOTH 2 % EX PADS
6.0000 | MEDICATED_PAD | Freq: Every day | CUTANEOUS | Status: DC
  Administered 2021-02-25 – 2021-03-01 (×3): 6 via TOPICAL

## 2021-02-25 MED ORDER — ASPIRIN EC 81 MG PO TBEC
81.0000 mg | DELAYED_RELEASE_TABLET | Freq: Every day | ORAL | Status: DC
  Administered 2021-02-25 – 2021-03-01 (×5): 81 mg via ORAL
  Filled 2021-02-25 (×5): qty 1

## 2021-02-25 NOTE — Evaluation (Signed)
Physical Therapy Evaluation Patient Details Name: Ruben Barrera MRN: [id number] DOB: [birth year] Today's Date: 02/25/2021  History of Present Illness  87 yo male admitted 12/5 with tachypnea, respiratory failure and CAP. PMhx: HFpEF, CAD, AS, R CVA, HTN, HLD, RLS  Clinical Impression  Pt demonstrates mod I bed mobility and is able to sit>stand without assistance. Ambulated 150' without AD; however, exhibited lateral sway and would widen BOS by placing hands on chair backs or counter tops if they were within reach. While ambulating, SPO2 dropped to 87% without the nasal cannula, but recovered once reattached to 2L; reports not needing O2 support typically. Pt has a history of falls with the most frequent one being yesterday, as reported by the pt and their wife. Pt would continue benefit from acute PT to increase independence with mobility, balance, and endurance. Recommend HHPT at discharge. Will continue to follow.      Recommendations for follow up therapy are one component of a multi-disciplinary discharge planning process, led by the attending physician.  Recommendations may be updated based on patient status, additional functional criteria and insurance authorization.  Follow Up Recommendations Home health PT    Assistance Recommended at Discharge None  Functional Status Assessment Patient has had a recent decline in their functional status and demonstrates the ability to make significant improvements in function in a reasonable and predictable amount of time.  Equipment Recommendations  None recommended by PT    Recommendations for Other Services       Precautions / Restrictions Restrictions Weight Bearing Restrictions: No      Mobility  Bed Mobility Overal bed mobility: Modified Independent             General bed mobility comments: supine>sit    Transfers Overall transfer level: Needs assistance Equipment used: None Transfers: Sit to/from Stand;Bed to  chair/wheelchair/BSC Sit to Stand: Supervision           General transfer comment: Pt able to perform transfer; however, they need assistance w/ line management    Ambulation/Gait Ambulation/Gait assistance: Min guard (For safety) Gait Distance (Feet): 150 Feet Assistive device: None Gait Pattern/deviations: Decreased step length - right;Decreased step length - left;Narrow base of support   Gait velocity interpretation: <1.8 ft/sec, indicate of risk for recurrent falls   General Gait Details: Ambulates w/ lateral sway, R>L. Can ambulate w/o AD; however, would benefit from RW to provide BUE support and a wider BOS. Pt would grab onto empty chairs and counter tops that were within reach while walking around the unit. Ambulated w/o nasal cannula; however, SPO2 was at 87% when returned to chair.  Stairs            Wheelchair Mobility    Modified Rankin (Stroke Patients Only)       Balance Overall balance assessment: Needs assistance Sitting-balance support: Single extremity supported;Bilateral upper extremity supported;Feet supported Sitting balance-Leahy Scale: Fair     Standing balance support: No upper extremity supported Standing balance-Leahy Scale: Fair Standing balance comment: Can maintain static standing balance w/o support; however, would benefit from BUE support while standing for longer durations and during ambulation.                             Pertinent Vitals/Pain Pain Assessment: 0-10 Pain Score: 1  Pain Location: Back (Had a spinal cord stimulator placed on April 20th) Pain Descriptors / Indicators: Sore Pain Intervention(s): Monitored during session;Limited activity within patient's tolerance;Premedicated before session  Home Living Family/patient expects to be discharged to:: Private residence Living Arrangements: Spouse/significant other Available Help at Discharge: Family;Available 24 hours/day (Spouse) Type of Home: House Home  Access: Stairs to enter Entrance Stairs-Rails: Psychiatric nurse of Steps: 3   Home Layout: One level Home Equipment: Grab bars - toilet;Grab bars - tub/shower;Rolling Walker (2 wheels) Additional Comments: Has a RW, does not use it daily    Prior Function Prior Level of Function : Independent/Modified Independent;History of Falls (last six months)             Mobility Comments: fell 2x getting up to go to the bathroom yesterday; needed assistance to get back up       Hand Dominance        Extremity/Trunk Assessment                Communication   Communication: No difficulties  Cognition Arousal/Alertness: Awake/alert Behavior During Therapy: WFL for tasks assessed/performed Overall Cognitive Status: Within Functional Limits for tasks assessed                                 General Comments: Pt pleasant and able to communicate well        General Comments      Exercises     Assessment/Plan    PT Assessment Patient needs continued PT services  PT Problem List Decreased balance;Decreased mobility       PT Treatment Interventions      PT Goals (Current goals can be found in the Care Plan section)       Frequency     Barriers to discharge        Co-evaluation               AM-PAC PT "6 Clicks" Mobility  Outcome Measure Help needed turning from your back to your side while in a flat bed without using bedrails?: None Help needed moving from lying on your back to sitting on the side of a flat bed without using bedrails?: None Help needed moving to and from a bed to a chair (including a wheelchair)?: None Help needed standing up from a chair using your arms (e.g., wheelchair or bedside chair)?: None Help needed to walk in hospital room?: A Little Help needed climbing 3-5 steps with a railing? : A Little 6 Click Score: 22    End of Session Equipment Utilized During Treatment: Gait belt Activity Tolerance:  Patient tolerated treatment well Patient left: in chair;with call bell/phone within reach;with chair alarm set;with family/visitor present Nurse Communication: Mobility status PT Visit Diagnosis: Unsteadiness on feet (R26.81);Other abnormalities of gait and mobility (R26.89);Repeated falls (R29.6);History of falling (Z91.81)    Time: 5681-2751 PT Time Calculation (min) (ACUTE ONLY): 26 min   Charges:   PT Evaluation $PT Eval Moderate Complexity: 1 Mod PT Treatments $Gait Training: 8-22 mins        Julieanne Manson, [place] Pager: [phone] Office: [place] 02/25/2021, 12:11 PM

## 2021-02-25 NOTE — Progress Notes (Signed)
Patient transported from ED Room 27 to 8N01 with no complications noted.

## 2021-02-25 NOTE — Progress Notes (Addendum)
[facility] Progress Note Patient Name: Ruben Barrera DOB: Jul 27, 1932 MRN: [id number]   Date of Service  02/25/2021  HPI/Events of Note  Fever to 102.8 F - Nursing request for Tylenol. AST and ALT both normal. Currently on Abx Rx with Cefepime IV.  eICU Interventions  Plan: Tylenol 650 mg PO Q 6 hours PRN Temp > 101.0 F.     Intervention Category Major Interventions: Infection - evaluation and management  V[place] Folden Eugene 02/25/2021, 10:10 PM

## 2021-02-25 NOTE — Progress Notes (Signed)
[facility] Progress Note Patient Name: Ruben Barrera DOB: 1933-01-24 MRN: [id number]   Date of Service  02/25/2021  HPI/Events of Note  Bedside RN with prior concern for soft blood pressure but BP is currently 112/64, MAP 80. K+ is 3.7 and patient is having ectopy (previously 2.7 but after one run of K+ repeat K+ came back at 3.7).  eICU Interventions  No treatment indicated for BP at this time, will give one run of KCL to bring K+ close to 4.0.        Kerry Kass Zahari Xiang 02/25/2021, 4:06 AM

## 2021-02-25 NOTE — Progress Notes (Signed)
[facility] Progress Note Patient Name: Ruben Barrera DOB: 09-Jul-1932 MRN: [id number]   Date of Service  02/25/2021  HPI/Events of Note  Brief run of SVT with ventricular rate = 180. Now sinus tachycardia with HR = 110. Frequent PVC's.  eICU Interventions  Plan: BMP and Mg++ level STAT.     Intervention Category Major Interventions: Arrhythmia - evaluation and management  Keysean Savino Cornelia Copa 02/25/2021, 9:46 PM

## 2021-02-25 NOTE — Progress Notes (Signed)
[facility] Progress Note Patient Name: Ruben Barrera DOB: [birth year] MRN: [id number]   Date of Service  02/25/2021  HPI/Events of Note  Patient admitted with acute respiratory failure, and is on BIPAP, work up is in progress.  eICU Interventions  New Patient Evaluation.        [name] 02/25/2021, 2:14 AM

## 2021-02-25 NOTE — Progress Notes (Signed)
PHARMACY - PHYSICIAN COMMUNICATION CRITICAL VALUE ALERT - BLOOD CULTURE IDENTIFICATION (BCID)  Ruben Barrera is an 87 y.o. male who presented to [facility] on 02/24/2021 with a chief complaint of CAP 2/2 viral illness  Assessment:   1 out 4 strep species (no further identification, no resistance markers) Concern for CAP 2/2 viral infection Most likely contaminant  Name of physician (or Provider) Contacted: Dr. Oletta Darter  Current antibiotics: Cefepime 2gm q12h  Changes to prescribed antibiotics recommended:  Patient is on recommended antibiotics - No changes needed  Results for orders placed or performed during the hospital encounter of 02/24/21  Blood Culture ID Panel (Reflexed) (Collected: 02/24/2021  6:50 PM)  Result Value Ref Range   Enterococcus faecalis NOT DETECTED NOT DETECTED   Enterococcus Faecium NOT DETECTED NOT DETECTED   Listeria monocytogenes NOT DETECTED NOT DETECTED   Staphylococcus species NOT DETECTED NOT DETECTED   Staphylococcus aureus (BCID) NOT DETECTED NOT DETECTED   Staphylococcus epidermidis NOT DETECTED NOT DETECTED   Staphylococcus lugdunensis NOT DETECTED NOT DETECTED   Streptococcus species DETECTED (A) NOT DETECTED   Streptococcus agalactiae NOT DETECTED NOT DETECTED   Streptococcus pneumoniae NOT DETECTED NOT DETECTED   Streptococcus pyogenes NOT DETECTED NOT DETECTED   A.calcoaceticus-baumannii NOT DETECTED NOT DETECTED   Bacteroides fragilis NOT DETECTED NOT DETECTED   Enterobacterales NOT DETECTED NOT DETECTED   Enterobacter cloacae complex NOT DETECTED NOT DETECTED   Escherichia coli NOT DETECTED NOT DETECTED   Klebsiella aerogenes NOT DETECTED NOT DETECTED   Klebsiella oxytoca NOT DETECTED NOT DETECTED   Klebsiella pneumoniae NOT DETECTED NOT DETECTED   Proteus species NOT DETECTED NOT DETECTED   Salmonella species NOT DETECTED NOT DETECTED   Serratia marcescens NOT DETECTED NOT DETECTED   Haemophilus influenzae NOT DETECTED NOT DETECTED    Neisseria meningitidis NOT DETECTED NOT DETECTED   Pseudomonas aeruginosa NOT DETECTED NOT DETECTED   Stenotrophomonas maltophilia NOT DETECTED NOT DETECTED   Candida albicans NOT DETECTED NOT DETECTED   Candida auris NOT DETECTED NOT DETECTED   Candida glabrata NOT DETECTED NOT DETECTED   Candida krusei NOT DETECTED NOT DETECTED   Candida parapsilosis NOT DETECTED NOT DETECTED   Candida tropicalis NOT DETECTED NOT DETECTED   Cryptococcus neoformans/gattii NOT DETECTED NOT DETECTED   Thank you for allowing pharmacy to be a part of this patient's care.  Donnald Garre, PharmD Clinical Pharmacist  Please check AMION for all [facility] numbers After 10:00 PM, call [place] [phone]

## 2021-02-25 NOTE — Progress Notes (Signed)
NAME:  Ruben Barrera, MRN:  [id number], DOB:  1932/08/14, LOS: 1 ADMISSION DATE:  02/24/2021, CONSULTATION DATE:  12/5 REFERRING MD:  Francia Greaves, EDP, CHIEF COMPLAINT:  respiratory distress    History of Present Illness:  Ruben Barrera is a 87 yr old male with PMHx as stated below presenting from home for several days of weakness/fatigue, decreased PO intake and fever up to 102-103 at home. The patient initially presented at 0500 hrs. on 02/24/2021 to the emergency room for fever of 103.5, nausea, generally not feeling well.  The patient felt he was waiting too long and left the emergency room.  Then on the evening of 1205/22 the patient became tachypneic and developed respiratory distress.  He remained febrile and had also been very nauseous through the afternoon.  He vomited multiple times with any p.o. intake.  Pertinent  Medical History   Past Medical History:  Diagnosis Date   Arthritis    Coronary artery disease    Depression    Dysrhythmia    "extra heart beat"   GERD (gastroesophageal reflux disease)    History of hiatal hernia    Hypercholesteremia    Hypertension    Lumbar spinal stenosis    RLS (restless legs syndrome)    takes ativan as needed   Stroke ([place]) 01/2020   minor - no deficits   Significant Hospital Events: Including procedures, antibiotic start and stop dates in addition to other pertinent events   12/5 > admitted for acute respiratory failure requiring BiPAP  Interim History / Subjective:  Overnight, admitted for acute respiratory failure requiring BiPAP support with some improvement.  This morning, patient is sitting up in bedside char, on 2-3L via [place]. Denies any respiratory complaints at this time. Continues to have generalized fatigue.    Objective   Blood pressure (!) 113/58, pulse 68, temperature 97.6 F (36.4 C), temperature source Axillary, resp. rate (!) 22, height 5\' 8"  (1.727 m), weight 83.7 kg, SpO2 97 %.    FiO2 (%):  [40 %] 40 %    Intake/Output Summary (Last 24 hours) at 02/25/2021 0721 Last data filed at 02/25/2021 0600 Gross per 24 hour  Intake 1103.21 ml  Output 101 ml  Net 1002.21 ml   Filed Weights   02/24/21 1838 02/25/21 0100  Weight: 82.6 kg 83.7 kg    Examination: General: generally well appearing elderly male, no acute distress HENT: [place]/AT, MMM, EOMI, anicteric sclerae Lungs: Diffuse crackles without wheezing or rhonchi, effort nl Cardiovascular: RRR, S1 and S2 present, no mrg Abdomen: soft, nondistended, nontender, +BS Extremities: warm and dry; no pitting edema  Neuro: Aox4, no focal deficits Skin: Warm and dry, no rash  Resolved Hospital Problem list   Acute encephalopathy  Assessment & Plan:  Acute hypoxic respiratory failure suspected 2/2 CAP viral infection  Pt presented with respiratory distress initially requiring BiPAP support with improvement. Currently on 2-3L O2 via [place]. Does have Tmax 102.6 and leukocytosis to 26; however, CXR without signs of overt pneumonia. No signs of infection on UA. COVID and Flu negative. He did have episodes of vomiting so could have component of aspiration pneumonia.  - Procalcitonin; if negative, will discontinue cefepime  - Respiratory viral panel  - Continue oxygen supplementation for goal SpO2 >88%  Acute kidney injury Baseline sCr ~0.7. Up to 1.3 on admission. Suspect ATN vs prerenal in setting of decreased PO intake. Did receive ~2L of IVF per sepsis protocol.  - Trend renal function -Strict I&O  -  Avoid nephrotoxic agents   HX HFrEF (EF 40-45%, G1DD) Hypertension Patient takes lasix and metoprolol at home. Reports medication compliance. Does have mild diffuse crackles on exam but otherwise appears euvolemic.  - IV Lasix 40mg   - F/u Echo - Continue home metoprolol 25mg  daily  - Continue pravastatin daily  Hx of depression - Resume home celexa and ativan prn   Chronic pain 2/2 lumbar stenosis RLS - Resume home Norco and gabapentin    BPH - Resume home tamsulosin   Best Practice (right click and "Reselect all SmartList Selections" daily)   Diet/type: Regular consistency (see orders) DVT prophylaxis: LMWH GI prophylaxis: PPI Lines: N/A Foley:  N/A Code Status:  full code Last date of multidisciplinary goals of care discussion [patient updated at bedside 12/6]  Labs   CBC: Recent Labs  Lab 02/24/21 1850 02/24/21 1921 02/24/21 2012 02/25/21 0022 02/25/21 0137  WBC 16.2*  --   --  24.2* 26.2*  NEUTROABS 14.4*  --   --   --   --   HGB 16.4 16.3 12.2* 14.3 14.9  HCT 50.1 48.0 36.0* 43.9 44.1  MCV 96.7  --   --  98.2 95.7  PLT 223  --   --  184 409    Basic Metabolic Panel: Recent Labs  Lab 02/24/21 1850 02/24/21 1921 02/24/21 2012 02/25/21 0022 02/25/21 0137  NA 135 138 138  --  134*  K 3.3* 3.6 2.7*  --  3.7  CL 99  --  119*  --  101  CO2 23  --   --   --  21*  GLUCOSE 147*  --  124*  --  142*  BUN 17  --  20  --  24*  CREATININE 1.17  --  0.70 1.35* 1.32*  CALCIUM 9.0  --   --   --  8.3*  MG  --   --   --   --  2.1  PHOS  --   --   --   --  3.3   GFR: Estimated Creatinine Clearance: 42.3 mL/min (A) (by C-G formula based on SCr of 1.32 mg/dL (H)). Recent Labs  Lab 02/24/21 1850 02/24/21 1859 02/24/21 2115 02/25/21 0022 02/25/21 0137  WBC 16.2*  --   --  24.2* 26.2*  LATICACIDVEN  --  2.2* 1.3  --   --     Liver Function Tests: Recent Labs  Lab 02/24/21 1850  AST 28  ALT 21  ALKPHOS 71  BILITOT 1.5*  PROT 7.8  ALBUMIN 4.1   No results for input(s): LIPASE, AMYLASE in the last 168 hours. No results for input(s): AMMONIA in the last 168 hours.  ABG    Component Value Date/Time   PHART 7.366 02/24/2021 1921   PCO2ART 42.6 02/24/2021 1921   PO2ART 75 (L) 02/24/2021 1921   HCO3 23.9 02/24/2021 1921   TCO2 21 (L) 02/24/2021 2012   ACIDBASEDEF 1.0 02/24/2021 1921   O2SAT 92.0 02/24/2021 1921     Coagulation Profile: Recent Labs  Lab 02/24/21 1850  INR 1.2     Cardiac Enzymes: No results for input(s): CKTOTAL, CKMB, CKMBINDEX, TROPONINI in the last 168 hours.  HbA1C: Hgb A1c MFr Bld  Date/Time Value Ref Range Status  02/21/2020 02:53 AM 5.6 4.8 - 5.6 % Final    Comment:    (NOTE) Pre diabetes:          5.7%-6.4%  Diabetes:              >  6.4%  Glycemic control for   <7.0% adults with diabetes     CBG: Recent Labs  Lab 02/25/21 0105 02/25/21 0337  GLUCAP 143* 144*    Critical care time:     Harvie Heck, MD Internal Medicine, PGY-3 02/25/21 10:24 AM Pager # [phone]

## 2021-02-25 NOTE — Progress Notes (Signed)
  Echocardiogram 2D Echocardiogram has been performed.  Ruben Barrera M 02/25/2021, 9:28 AM

## 2021-02-26 DIAGNOSIS — G2581 Restless legs syndrome: Secondary | ICD-10-CM

## 2021-02-26 DIAGNOSIS — I5043 Acute on chronic combined systolic (congestive) and diastolic (congestive) heart failure: Secondary | ICD-10-CM

## 2021-02-26 DIAGNOSIS — N179 Acute kidney failure, unspecified: Secondary | ICD-10-CM

## 2021-02-26 DIAGNOSIS — N400 Enlarged prostate without lower urinary tract symptoms: Secondary | ICD-10-CM

## 2021-02-26 LAB — BASIC METABOLIC PANEL
Anion gap: 9 (ref 5–15)
BUN: 28 mg/dL — ABNORMAL HIGH (ref 8–23)
CO2: 26 mmol/L (ref 22–32)
Calcium: 8.1 mg/dL — ABNORMAL LOW (ref 8.9–10.3)
Chloride: 100 mmol/L (ref 98–111)
Creatinine, Ser: 1.36 mg/dL — ABNORMAL HIGH (ref 0.61–1.24)
GFR, Estimated: 51 mL/min — ABNORMAL LOW (ref >60)
Glucose, Bld: 115 mg/dL — ABNORMAL HIGH (ref 70–99)
Potassium: 4 mmol/L (ref 3.5–5.1)
Sodium: 135 mmol/L (ref 135–145)

## 2021-02-26 LAB — PHOSPHORUS: Phosphorus: 3.6 mg/dL (ref 2.5–4.6)

## 2021-02-26 LAB — CBC
HCT: 42 % (ref 39.0–52.0)
Hemoglobin: 14 g/dL (ref 13.0–17.0)
MCH: 32.3 pg (ref 26.0–34.0)
MCHC: 33.3 g/dL (ref 30.0–36.0)
MCV: 97 fL (ref 80.0–100.0)
Platelets: 179 10*3/uL (ref 150–400)
RBC: 4.33 MIL/uL (ref 4.22–5.81)
RDW: 13.4 % (ref 11.5–15.5)
WBC: 19 10*3/uL — ABNORMAL HIGH (ref 4.0–10.5)
nRBC: 0 % (ref 0.0–0.2)

## 2021-02-26 LAB — MAGNESIUM: Magnesium: 2.3 mg/dL (ref 1.7–2.4)

## 2021-02-26 MED ORDER — SODIUM CHLORIDE 0.9 % IV SOLN
2.0000 g | INTRAVENOUS | Status: DC
  Administered 2021-02-26: 2 g via INTRAVENOUS
  Filled 2021-02-26: qty 20

## 2021-02-26 MED ORDER — FUROSEMIDE 10 MG/ML IJ SOLN
40.0000 mg | Freq: Once | INTRAMUSCULAR | Status: AC
  Administered 2021-02-26: 40 mg via INTRAVENOUS
  Filled 2021-02-26: qty 4

## 2021-02-26 NOTE — TOC Initial Note (Addendum)
Transition of Care [facility]) - Initial/Assessment Note    Patient Details  Name: Ruben Barrera MRN: [id number] Date of Birth: [birth year]  [facility]) CM/SW Contact:    Sharin Mons, RN Phone Number: 02/26/2021, 4:25 PM  Clinical Narrative:                 Admitted with respiratory failure and CAP. From home with wife.  Pt oriented x 1, self. NCM spoke with wife regarding d/c planning. NCM shared PT 's evaluation/ recommendation : HHPT. Wife agreeable to home health services. Choice provided .Wife without preference. Referral made with [facility] and accepted pending MD's order.  No DME needs noted.  TOC team following and will assist with TOC needs.     02/28/2021 Plan : TEE today. Referral made with Pam / [facility] for potential LT IV ABX need... TOC team will continue to monitor    Expected Discharge Plan: Home/Self Care     Patient Goals and CMS Choice     Choice offered to / list presented to : Spouse  Expected Discharge Plan and Services Expected Discharge Plan: Home/Self Care   Discharge Planning Services: CM Consult   Living arrangements for the past 2 months: Single Family Home                           HH Arranged: PT, OT HH Agency: [facility] Date [facility] Agency Contacted: 02/26/21 Time HH Agency Contacted: 29 Representative spoke with at [place]  Prior Living Arrangements/Services Living arrangements for the past 2 months: [place] Lives with:: Spouse Patient language and need for interpreter reviewed:: Yes Do you feel safe going back to the place where you live?: Yes      Need for Family Participation in Patient Care: Yes (Comment) Care giver support system in place?: Yes (comment) Current home services: DME (cane, RW, W/C) Criminal Activity/Legal Involvement Pertinent to Current Situation/Hospitalization: No - Comment as needed  Activities of Daily Living      Permission  Sought/Granted                  Emotional Assessment Appearance:: Appears stated age     Orientation: : Oriented to Self Alcohol / Substance Use: Not Applicable Psych Involvement: No (comment)  Admission diagnosis:  Acute respiratory failure (HCC) [J96.00] Fever, unspecified fever cause [R50.9] Patient Active Problem List   Diagnosis Date Noted   Acute on chronic combined systolic and diastolic CHF (congestive heart failure) ([place]) 02/26/2021   RLS (restless legs syndrome) 02/26/2021   AKI (acute kidney injury) ([place]) 02/26/2021   Acute hypoxemic respiratory failure ([place]) 02/24/2021   S/P insertion of spinal cord stimulator 07/10/2020   Frequent PVCs 04/15/2020   Preop cardiovascular exam 04/15/2020   HFrEF (heart failure with reduced ejection fraction) ([place]) 04/15/2020   SVT (supraventricular tachycardia) ([place]) 04/15/2020   Coronary artery disease involving native coronary artery of native heart without angina pectoris 04/15/2020   Moderate to severe aortic stenosis 03/19/2020   Dyslipidemia    Chronic systolic congestive heart failure ([place])    Acute ischemic cerebrovascular accident (CVA) involving anterior cerebral artery territory ([place]) 02/22/2020   Mixed hyperlipidemia    Tobacco abuse    Depression    Acute stroke due to occlusion of right cerebellar artery ([place]) 02/20/2020   Erythrocytosis 02/20/2020   BPH (benign prostatic hyperplasia) 02/20/2020   HNP (herniated nucleus pulposus), lumbar  01/13/2016   Lumbar stenosis with neurogenic claudication 08/28/2015   DOE (dyspnea on exertion) 05/01/2014   Essential hypertension 05/01/2014   Left shoulder pain 05/05/2011   PCP:  Lajean Manes, MD Pharmacy:   [place], [place] [address] Phone: [phone] Fax: [phone]     Social Determinants of Health (SDOH) Interventions    Readmission Risk Interventions No  flowsheet data found.

## 2021-02-26 NOTE — Progress Notes (Signed)
PROGRESS NOTE  Ruben Barrera WFU:[id number] DOB: [birth year] DOA: 02/24/2021 PCP: Lajean Manes, MD   LOS: 2 days   Brief Narrative / Interim history: 87 year old male with history of CAD, HTN, HLD, prior CVA who comes into the hospital with complaints of weakness, fatigue, fever and shortness of breath.  He was febrile to 103.5 on admission on 12/5, nausea, feeling weak and fatigued.  He was also found to have hypoxia requiring supplemental oxygen.  He was initially on BiPAP and was admitted to the ICU.  He was placed on antibiotics.  COVID, influenza, RVP all negative.  Improving, was transferred to the hospitalist on 12/7  Subjective / 24h Interval events: Feeling better today, breathing is better.  Assessment & Plan: Principal Problem:   Acute hypoxemic respiratory failure (HCC) Active Problems:   Essential hypertension   BPH (benign prostatic hyperplasia)   Mixed hyperlipidemia   Coronary artery disease involving native coronary artery of native heart without angina pectoris   Acute on chronic combined systolic and diastolic CHF (congestive heart failure) (HCC)   RLS (restless legs syndrome)   AKI (acute kidney injury) ([place])  Principal Problem Acute hypoxic respiratory failure suspected 2/2 CAP viral infection, Streptococcus bacteremia -Patient initially with respiratory distress requiring BiPAP.  He improved gradually, currently on 2 L nasal cannula.  Blood cultures showing Streptococcus on the BCID, preliminary.  Blood cultures also show gram-positive cocci, identification to follow.  Currently he is on cefepime, continue  Active Problems Acute kidney injury -Baseline sCr ~0.7. Up to 1.3 on admission. Suspect ATN vs prerenal in setting of decreased PO intake.  Received fluids but has some evidence of fluid overload and received Lasix over the last 2 days.  Continue Lasix x1 today.  Renal function is stable   Acute on chronic HFrEF (EF 40-45%, G1DD), CAD -received fluids on  presentation and also had mild superimposed interstitial edema on the chest x-ray.  Lasix x1 today.  Continue home metoprolol.  2D echo done 12/6 shows an EF of 30-35%  Essential hypertension-continue home metoprolol  Hx of depression- Resume home celexa and ativan prn    Chronic pain 2/2 lumbar stenosis  RLS - Resume home Norco and gabapentin    BPH - Resume home tamsulosin   Hyperlipidemia-continue statin  Scheduled Meds:  aspirin EC  81 mg Oral Daily   chlorhexidine  15 mL Mouth Rinse BID   Chlorhexidine Gluconate Cloth  6 each Topical Daily   citalopram  20 mg Oral Daily   gabapentin  600 mg Oral TID   mouth rinse  15 mL Mouth Rinse q12n4p   metoprolol succinate  25 mg Oral Daily   pantoprazole  40 mg Oral Daily   pravastatin  20 mg Oral q1800   tamsulosin  0.4 mg Oral Daily   Continuous Infusions:  ceFEPime (MAXIPIME) IV 2 g (02/26/21 0532)   PRN Meds:.acetaminophen, albuterol, docusate sodium, HYDROcodone-acetaminophen, LORazepam, ondansetron (ZOFRAN) IV, polyethylene glycol  Diet Orders (From admission, onward)     Start     Ordered   02/25/21 0958  Diet Heart Room service appropriate? Yes; Fluid consistency: Thin  Diet effective now       Question Answer Comment  Room service appropriate? Yes   Fluid consistency: Thin      02/25/21 0957            DVT prophylaxis: SCDs Start: 02/24/21 2258     Code Status: Full Code  Family Communication: no family at bedside   Status  is: Inpatient  Remains inpatient appropriate because: dyspnea, bacteremia  Level of care: Med-Surg  Consultants:  none  Procedures:  2D echo  Microbiology  Blood cultures 12/6 [facility]  Antimicrobials: Cefepime 12/6 >>    Objective: Vitals:   02/26/21 0600 02/26/21 0700 02/26/21 0749 02/26/21 0936  BP: 115/64 123/64  (!) 132/91  Pulse: 86 83  85  Resp: 19 18    Temp:   98.5 F (36.9 C)   TempSrc:   Oral   SpO2: 96% 94%    Weight:      Height:        Intake/Output  Summary (Last 24 hours) at 02/26/2021 0951 Last data filed at 02/26/2021 0430 Gross per 24 hour  Intake --  Output 1420 ml  Net -1420 ml   Filed Weights   02/24/21 1838 02/25/21 0100 02/26/21 0500  Weight: 82.6 kg 83.7 kg 83.5 kg    Examination:  Constitutional: NAD Eyes: no scleral icterus ENMT: Mucous membranes are moist.  Neck: normal, supple Respiratory: Faint bibasilar crackles, no wheezing Cardiovascular: Regular rate and rhythm, no murmurs / rubs / gallops. No LE edema.  Abdomen: non distended, no tenderness. Bowel sounds positive.  Musculoskeletal: no clubbing / cyanosis.  Skin: no rashes Neurologic: Nonfocal  Data Reviewed: I have independently reviewed following labs and imaging studies   CBC: Recent Labs  Lab 02/24/21 1850 02/24/21 1921 02/24/21 2012 02/25/21 0022 02/25/21 0137 02/26/21 0325  WBC 16.2*  --   --  24.2* 26.2* 19.0*  NEUTROABS 14.4*  --   --   --   --   --   HGB 16.4 16.3 12.2* 14.3 14.9 14.0  HCT 50.1 48.0 36.0* 43.9 44.1 42.0  MCV 96.7  --   --  98.2 95.7 97.0  PLT 223  --   --  184 190 106   Basic Metabolic Panel: Recent Labs  Lab 02/24/21 1850 02/24/21 1921 02/24/21 2012 02/25/21 0022 02/25/21 0137 02/25/21 1349 02/25/21 2237 02/26/21 0325  NA 135   < > 138  --  134* 135 134* 135  K 3.3*   < > 2.7*  --  3.7 4.0 4.2 4.0  CL 99  --  119*  --  101 99 100 100  CO2 23  --   --   --  21* 26 26 26   GLUCOSE 147*  --  124*  --  142* 115* 131* 115*  BUN 17  --  20  --  24* 33* 33* 28*  CREATININE 1.17  --  0.70 1.35* 1.32* 1.61* 1.43* 1.36*  CALCIUM 9.0  --   --   --  8.3* 8.0* 8.1* 8.1*  MG  --   --   --   --  2.1  --  2.2 2.3  PHOS  --   --   --   --  3.3  --   --  3.6   < > = values in this interval not displayed.   Liver Function Tests: Recent Labs  Lab 02/24/21 1850  AST 28  ALT 21  ALKPHOS 71  BILITOT 1.5*  PROT 7.8  ALBUMIN 4.1   Coagulation Profile: Recent Labs  Lab 02/24/21 1850  INR 1.2   HbA1C: No results  for input(s): HGBA1C in the last 72 hours. CBG: Recent Labs  Lab 02/25/21 0105 02/25/21 0337 02/25/21 0731  GLUCAP 143* 144* 118*    Recent Results (from the past 240 hour(s))  Resp Panel by RT-PCR (Flu A&B,  Covid) Nasopharyngeal Swab     Status: None   Collection Time: 02/24/21  6:19 AM   Specimen: Nasopharyngeal Swab; Nasopharyngeal(NP) swabs in vial transport medium  Result Value Ref Range Status   SARS Coronavirus 2 by RT PCR NEGATIVE NEGATIVE Final    Comment: (NOTE) SARS-CoV-2 target nucleic acids are NOT DETECTED.  The SARS-CoV-2 RNA is generally detectable in upper respiratory specimens during the acute phase of infection. The lowest concentration of SARS-CoV-2 viral copies this assay can detect is 138 copies/mL. A negative result does not preclude SARS-Cov-2 infection and should not be used as the sole basis for treatment or other patient management decisions. A negative result may occur with  improper specimen collection/handling, submission of specimen other than nasopharyngeal swab, presence of viral mutation(s) within the areas targeted by this assay, and inadequate number of viral copies(<138 copies/mL). A negative result must be combined with clinical observations, patient history, and epidemiological information. The expected result is Negative.  Fact Sheet for Patients:  EntrepreneurPulse.com.au  Fact Sheet for Healthcare Providers:  IncredibleEmployment.be  This test is no t yet approved or cleared by the [place] FDA and  has been authorized for detection and/or diagnosis of SARS-CoV-2 by FDA under an Emergency Use Authorization (EUA). This EUA will remain  in effect (meaning this test can be used) for the duration of the COVID-19 declaration under Section 564(b)(1) of the Act, 21 U.S.C.section 360bbb-3(b)(1), unless the authorization is terminated  or revoked sooner.       Influenza A by PCR NEGATIVE NEGATIVE  Final   Influenza B by PCR NEGATIVE NEGATIVE Final    Comment: (NOTE) The Xpert Xpress SARS-CoV-2/FLU/RSV plus assay is intended as an aid in the diagnosis of influenza from Nasopharyngeal swab specimens and should not be used as a sole basis for treatment. Nasal washings and aspirates are unacceptable for Xpert Xpress SARS-CoV-2/FLU/RSV testing.  Fact Sheet for Patients: EntrepreneurPulse.com.au  Fact Sheet for Healthcare Providers: IncredibleEmployment.be  This test is not yet approved or cleared by the [place] FDA and has been authorized for detection and/or diagnosis of SARS-CoV-2 by FDA under an Emergency Use Authorization (EUA). This EUA will remain in effect (meaning this test can be used) for the duration of the COVID-19 declaration under Section 564(b)(1) of the Act, 21 U.S.C. section 360bbb-3(b)(1), unless the authorization is terminated or revoked.  Performed at [facility], [place] [address] [place],  [address]   Resp Panel by RT-PCR (Flu A&B, Covid) Nasopharyngeal Swab     Status: None   Collection Time: 02/24/21  6:44 PM   Specimen: Nasopharyngeal Swab; Nasopharyngeal(NP) swabs in vial transport medium  Result Value Ref Range Status   SARS Coronavirus 2 by RT PCR NEGATIVE NEGATIVE Final    Comment: (NOTE) SARS-CoV-2 target nucleic acids are NOT DETECTED.  The SARS-CoV-2 RNA is generally detectable in upper respiratory specimens during the acute phase of infection. The lowest concentration of SARS-CoV-2 viral copies this assay can detect is 138 copies/mL. A negative result does not preclude SARS-Cov-2 infection and should not be used as the sole basis for treatment or other patient management decisions. A negative result may occur with  improper specimen collection/handling, submission of specimen other than nasopharyngeal swab, presence of viral mutation(s) within the areas targeted by this assay, and  inadequate number of viral copies(<138 copies/mL). A negative result must be combined with clinical observations, patient history, and epidemiological information. The expected result is Negative.  Fact Sheet for Patients:  EntrepreneurPulse.com.au  Fact Sheet for Healthcare Providers:  IncredibleEmployment.be  This test is no t yet approved or cleared by the [place] FDA and  has been authorized for detection and/or diagnosis of SARS-CoV-2 by FDA under an Emergency Use Authorization (EUA). This EUA will remain  in effect (meaning this test can be used) for the duration of the COVID-19 declaration under Section 564(b)(1) of the Act, 21 U.S.C.section 360bbb-3(b)(1), unless the authorization is terminated  or revoked sooner.       Influenza A by PCR NEGATIVE NEGATIVE Final   Influenza B by PCR NEGATIVE NEGATIVE Final    Comment: (NOTE) The Xpert Xpress SARS-CoV-2/FLU/RSV plus assay is intended as an aid in the diagnosis of influenza from Nasopharyngeal swab specimens and should not be used as a sole basis for treatment. Nasal washings and aspirates are unacceptable for Xpert Xpress SARS-CoV-2/FLU/RSV testing.  Fact Sheet for Patients: EntrepreneurPulse.com.au  Fact Sheet for Healthcare Providers: IncredibleEmployment.be  This test is not yet approved or cleared by the [place] FDA and has been authorized for detection and/or diagnosis of SARS-CoV-2 by FDA under an Emergency Use Authorization (EUA). This EUA will remain in effect (meaning this test can be used) for the duration of the COVID-19 declaration under Section 564(b)(1) of the Act, 21 U.S.C. section 360bbb-3(b)(1), unless the authorization is terminated or revoked.  Performed at [facility], [place] [address] [place], [place] [address]   Culture, blood (Routine x 2)     Status: None (Preliminary result)   Collection Time:  02/24/21  6:50 PM   Specimen: BLOOD  Result Value Ref Range Status   Specimen Description BLOOD RIGHT ANTECUBITAL  Final   Special Requests   Final    BOTTLES DRAWN AEROBIC AND ANAEROBIC Blood Culture adequate volume   Culture  Setup Time   Final    GRAM POSITIVE COCCI IN BOTH AEROBIC AND ANAEROBIC BOTTLES CRITICAL RESULT CALLED TO, READ BACK BY AND VERIFIED WITH: PHARMD E.BREWINGTON AT 1600 ON 02/25/2021 BY EP.    Culture   Final    GRAM POSITIVE COCCI IDENTIFICATION TO FOLLOW Performed at [facility], [place] [address], [place],  [address]    Report Status PENDING  Incomplete  Blood Culture ID Panel (Reflexed)     Status: Abnormal   Collection Time: 02/24/21  6:50 PM  Result Value Ref Range Status   Enterococcus faecalis NOT DETECTED NOT DETECTED Final   Enterococcus Faecium NOT DETECTED NOT DETECTED Final   Listeria monocytogenes NOT DETECTED NOT DETECTED Final   Staphylococcus species NOT DETECTED NOT DETECTED Final   Staphylococcus aureus (BCID) NOT DETECTED NOT DETECTED Final   Staphylococcus epidermidis NOT DETECTED NOT DETECTED Final   Staphylococcus lugdunensis NOT DETECTED NOT DETECTED Final   Streptococcus species DETECTED (A) NOT DETECTED Final    Comment: Not Enterococcus species, Streptococcus agalactiae, Streptococcus pyogenes, or Streptococcus pneumoniae. CRITICAL RESULT CALLED TO, READ BACK BY AND VERIFIED WITH: PHARMD EDEN BREWINGTON 02/25/2021 @1854  BY JW    Streptococcus agalactiae NOT DETECTED NOT DETECTED Final   Streptococcus pneumoniae NOT DETECTED NOT DETECTED Final   Streptococcus pyogenes NOT DETECTED NOT DETECTED Final   A.calcoaceticus-baumannii NOT DETECTED NOT DETECTED Final   Bacteroides fragilis NOT DETECTED NOT DETECTED Final   Enterobacterales NOT DETECTED NOT DETECTED Final   Enterobacter cloacae complex NOT DETECTED NOT DETECTED Final   Escherichia coli NOT DETECTED NOT DETECTED Final   Klebsiella aerogenes NOT DETECTED NOT  DETECTED Final   Klebsiella oxytoca NOT DETECTED NOT  DETECTED Final   Klebsiella pneumoniae NOT DETECTED NOT DETECTED Final   Proteus species NOT DETECTED NOT DETECTED Final   Salmonella species NOT DETECTED NOT DETECTED Final   Serratia marcescens NOT DETECTED NOT DETECTED Final   Haemophilus influenzae NOT DETECTED NOT DETECTED Final   Neisseria meningitidis NOT DETECTED NOT DETECTED Final   Pseudomonas aeruginosa NOT DETECTED NOT DETECTED Final   Stenotrophomonas maltophilia NOT DETECTED NOT DETECTED Final   Candida albicans NOT DETECTED NOT DETECTED Final   Candida auris NOT DETECTED NOT DETECTED Final   Candida glabrata NOT DETECTED NOT DETECTED Final   Candida krusei NOT DETECTED NOT DETECTED Final   Candida parapsilosis NOT DETECTED NOT DETECTED Final   Candida tropicalis NOT DETECTED NOT DETECTED Final   Cryptococcus neoformans/gattii NOT DETECTED NOT DETECTED Final    Comment: Performed at [facility], [place] [address], [place], [place] [address]  Culture, blood (Routine x 2)     Status: None (Preliminary result)   Collection Time: 02/24/21  6:59 PM   Specimen: BLOOD LEFT FOREARM  Result Value Ref Range Status   Specimen Description BLOOD LEFT FOREARM  Final   Special Requests   Final    BOTTLES DRAWN AEROBIC AND ANAEROBIC Blood Culture adequate volume   Culture  Setup Time   Final    GRAM POSITIVE COCCI ANAEROBIC BOTTLE ONLY CRITICAL VALUE NOTED.  VALUE IS CONSISTENT WITH PREVIOUSLY REPORTED AND CALLED VALUE.    Culture   Final    GRAM POSITIVE COCCI IDENTIFICATION TO FOLLOW Performed at [facility], [place] [address], [place], [place] [address]    Report Status PENDING  Incomplete  MRSA Next Gen by PCR, Nasal     Status: None   Collection Time: 02/25/21  3:16 AM   Specimen: Nasal Mucosa; Nasal Swab  Result Value Ref Range Status   MRSA by PCR Next Gen NOT DETECTED NOT DETECTED Final    Comment: (NOTE) The GeneXpert MRSA Assay (FDA approved for NASAL  specimens only), is one component of a comprehensive MRSA colonization surveillance program. It is not intended to diagnose MRSA infection nor to guide or monitor treatment for MRSA infections. Test performance is not FDA approved in patients less than 32 years old. Performed at [facility], [place] [address], [place],  [address]   Respiratory (~20 pathogens) panel by PCR     Status: None   Collection Time: 02/25/21 10:30 AM   Specimen: Nasopharyngeal Swab; Respiratory  Result Value Ref Range Status   Adenovirus NOT DETECTED NOT DETECTED Final   Coronavirus 229E NOT DETECTED NOT DETECTED Final    Comment: (NOTE) The Coronavirus on the Respiratory Panel, DOES NOT test for the novel  Coronavirus (2019 nCoV)    Coronavirus HKU1 NOT DETECTED NOT DETECTED Final   Coronavirus NL63 NOT DETECTED NOT DETECTED Final   Coronavirus OC43 NOT DETECTED NOT DETECTED Final   Metapneumovirus NOT DETECTED NOT DETECTED Final   Rhinovirus / Enterovirus NOT DETECTED NOT DETECTED Final   Influenza A NOT DETECTED NOT DETECTED Final   Influenza B NOT DETECTED NOT DETECTED Final   Parainfluenza Virus 1 NOT DETECTED NOT DETECTED Final   Parainfluenza Virus 2 NOT DETECTED NOT DETECTED Final   Parainfluenza Virus 3 NOT DETECTED NOT DETECTED Final   Parainfluenza Virus 4 NOT DETECTED NOT DETECTED Final   Respiratory Syncytial Virus NOT DETECTED NOT DETECTED Final   Bordetella pertussis NOT DETECTED NOT DETECTED Final   Bordetella Parapertussis NOT DETECTED NOT  DETECTED Final   Chlamydophila pneumoniae NOT DETECTED NOT DETECTED Final   Mycoplasma pneumoniae NOT DETECTED NOT DETECTED Final    Comment: Performed at [facility], [place] [address] [place], [place] [address]     Radiology Studies: No results found.   Marzetta Board, MD, PhD [facility]  Between 7 am - 7 pm I am available, please contact me via Amion (for emergencies) or Securechat (non urgent messages)  Between 7 pm  - 7 am I am not available, please contact night coverage MD/APP via Amion

## 2021-02-27 DIAGNOSIS — J9601 Acute respiratory failure with hypoxia: Secondary | ICD-10-CM

## 2021-02-27 LAB — CBC
HCT: 40.8 % (ref 39.0–52.0)
Hemoglobin: 13.7 g/dL (ref 13.0–17.0)
MCH: 32.2 pg (ref 26.0–34.0)
MCHC: 33.6 g/dL (ref 30.0–36.0)
MCV: 96 fL (ref 80.0–100.0)
Platelets: 193 10*3/uL (ref 150–400)
RBC: 4.25 MIL/uL (ref 4.22–5.81)
RDW: 13.2 % (ref 11.5–15.5)
WBC: 11.6 10*3/uL — ABNORMAL HIGH (ref 4.0–10.5)
nRBC: 0 % (ref 0.0–0.2)

## 2021-02-27 LAB — CULTURE, BLOOD (ROUTINE X 2)
Special Requests: ADEQUATE
Special Requests: ADEQUATE

## 2021-02-27 LAB — BASIC METABOLIC PANEL
Anion gap: 10 (ref 5–15)
BUN: 25 mg/dL — ABNORMAL HIGH (ref 8–23)
CO2: 24 mmol/L (ref 22–32)
Calcium: 8.4 mg/dL — ABNORMAL LOW (ref 8.9–10.3)
Chloride: 100 mmol/L (ref 98–111)
Creatinine, Ser: 1.23 mg/dL (ref 0.61–1.24)
GFR, Estimated: 57 mL/min — ABNORMAL LOW (ref >60)
Glucose, Bld: 113 mg/dL — ABNORMAL HIGH (ref 70–99)
Potassium: 3.7 mmol/L (ref 3.5–5.1)
Sodium: 134 mmol/L — ABNORMAL LOW (ref 135–145)

## 2021-02-27 MED ORDER — DEXTROSE 5 % IV SOLN
4.0000 10*6.[IU] | Freq: Four times a day (QID) | INTRAVENOUS | Status: DC
  Administered 2021-02-27 – 2021-03-01 (×8): 4 10*6.[IU] via INTRAVENOUS
  Filled 2021-02-27 (×13): qty 4

## 2021-02-27 NOTE — Consult Note (Signed)
[name] for Infectious Disease    Date of Admission:  02/24/2021   Total days of inpatient antibiotics 4        Reason for Consult: Strep intermedius bacteremia    Principal Problem:   Acute hypoxemic respiratory failure (HCC) Active Problems:   Essential hypertension   BPH (benign prostatic hyperplasia)   Mixed hyperlipidemia   Coronary artery disease involving native coronary artery of native heart without angina pectoris   Acute on chronic combined systolic and diastolic CHF (congestive heart failure) (HCC)   RLS (restless legs syndrome)   AKI (acute kidney injury) ([place])   Assessment: 88 YM with spinal stimulator for chronic back pain, HFrEF presented with respiratory distress and fever. Initially placed on Bipap in the ICU, now on room air. ID engaged per strep intermedius bacteremia.  #Strep intermedius bacteremia 2/2 seeding from dental procedure -Patient reported he has not been feeling well for the past couple weeks. He stated he had a dental procedure done for a tooth infection about 3-4 weeks. Suspect this was the event that led to bacteremia. He has likely been bacteremic for the past 2 weeks. As such recommend further evaluation with TEE(TTE showed no vegetations) to look for endocarditis.  Of note he has spina stimulator, I would have a low threshold to image his back as well. He has not complained of back pain and spinal stimulator site is not concerning for infection on exam.  Recommendations:  -D/C ceftriaxone -Start Penicillin -Ordered TEE -Repeat blood Cx -Low threshold to image back  #Acute Respiratory failure with concern for community acquired pneumonia vs acute on chronic heart failure -CXR showed diffuse interstitial opacity likely representing edema -Diuresed with lasix, he had recieved cefepime(2d)->ceftriaxone(1d) -Based on imaging and now the etiology of fever is thought to be bacteremia, I am suspect respiratory distress was 2/2 heart  failure.   Microbiology:   Antibiotics: Cefepime 12/5-12/6 Ceftriaxone 12/7-p Vancomycin 12/5-12/6  Cultures: Blood 02/24/21 blood Cx 2/2-strep intermedius   HPI: Ruben Barrera is a 87 y.o. male with Hx of HFrEF, CAD, CVA, HTN, chronic back pain requiring a spinal stimulator initially presented to Ed with fever and  "not feeling well." He left due to long wait times. Returned same day as he developed respiratory distress and was nauseous. Vomited multiple times that day. He was admitted for acute respiratory failure 2.2 suspected pneumonia. Placed on Bipap. He was found to strep intermedius bacteremia. TTE showed no valvular vegetations. ID came on board due to gram positive bacteremia.  Today, pt is resting in bed with wife at bedside. He reports not feeling well for the past two weeks. Wife reports he developed a fever few days prior to hospitalization. He had a dental procedure done 34 weeks ago for an infection. Also, reports having a spinal stimulator placed for back pain in April of this year. Currently, pt is on room air Review of Systems: Review of Systems  All other systems reviewed and are negative.  Past Medical History:  Diagnosis Date   Arthritis    Coronary artery disease    Depression    Dysrhythmia    "extra heart beat"   GERD (gastroesophageal reflux disease)    History of hiatal hernia    Hypercholesteremia    Hypertension    Lumbar spinal stenosis    RLS (restless legs syndrome)    takes ativan as needed   Stroke ([place]) 01/2020   minor - no deficits  Social History   Tobacco Use   Smoking status: Some Days    Types: Cigars   Smokeless tobacco: Never   Tobacco comments:    quit 1972- currently 1 cigar per week  Vaping Use   Vaping Use: Never used  Substance Use Topics   Alcohol use: Yes    Comment: occasionally beer/wine or scotch   Drug use: No    Comment: 1 cigar per week    Family History  Problem Relation Age of Onset   Stroke Mother     Hypertension Other        family history   Scheduled Meds:  aspirin EC  81 mg Oral Daily   chlorhexidine  15 mL Mouth Rinse BID   Chlorhexidine Gluconate Cloth  6 each Topical Daily   citalopram  20 mg Oral Daily   gabapentin  600 mg Oral TID   mouth rinse  15 mL Mouth Rinse q12n4p   metoprolol succinate  25 mg Oral Daily   pantoprazole  40 mg Oral Daily   pravastatin  20 mg Oral q1800   tamsulosin  0.4 mg Oral Daily   Continuous Infusions:  pencillin G potassium IV     PRN Meds:.acetaminophen, albuterol, docusate sodium, HYDROcodone-acetaminophen, LORazepam, ondansetron (ZOFRAN) IV, polyethylene glycol Allergies  Allergen Reactions   Simvastatin Other (See Comments)    Leg pains    Atorvastatin Other (See Comments)    Muscle aches   Benazepril Hcl Other (See Comments)    OBJECTIVE: Blood pressure (!) 155/67, pulse (!) 44, temperature 98.8 F (37.1 C), temperature source Oral, resp. rate 18, height 5\' 8"  (1.727 m), weight 83.5 kg, SpO2 98 %.  Physical Exam Constitutional:      General: He is not in acute distress.    Appearance: He is normal weight. He is not toxic-appearing.  HENT:     Head: Normocephalic and atraumatic.     Right Ear: External ear normal.     Left Ear: External ear normal.     Nose: No congestion or rhinorrhea.     Mouth/Throat:     Mouth: Mucous membranes are moist.     Pharynx: Oropharynx is clear.  Eyes:     Extraocular Movements: Extraocular movements intact.     Conjunctiva/sclera: Conjunctivae normal.     Pupils: Pupils are equal, round, and reactive to light.  Cardiovascular:     Rate and Rhythm: Normal rate and regular rhythm.     Heart sounds: No murmur heard.   No friction rub. No gallop.  Pulmonary:     Effort: Pulmonary effort is normal.     Breath sounds: Normal breath sounds.  Abdominal:     General: Abdomen is flat. Bowel sounds are normal.     Palpations: Abdomen is soft.  Musculoskeletal:        General: No swelling.  Normal range of motion.     Cervical back: Normal range of motion and neck supple.     Comments: Spinal stimulator no signs of infection.   Skin:    General: Skin is warm and dry.  Neurological:     General: No focal deficit present.     Mental Status: He is oriented to person, place, and time.  Psychiatric:        Mood and Affect: Mood normal.    Lab Results Lab Results  Component Value Date   WBC 11.6 (H) 02/27/2021   HGB 13.7 02/27/2021   HCT 40.8 02/27/2021   MCV 96.0  02/27/2021   PLT 193 02/27/2021    Lab Results  Component Value Date   CREATININE 1.23 02/27/2021   BUN 25 (H) 02/27/2021   NA 134 (L) 02/27/2021   K 3.7 02/27/2021   CL 100 02/27/2021   CO2 24 02/27/2021    Lab Results  Component Value Date   ALT 21 02/24/2021   AST 28 02/24/2021   ALKPHOS 71 02/24/2021   BILITOT 1.5 (H) 02/24/2021       Laurice Record, [facility] 02/27/2021, 1:56 PM

## 2021-02-27 NOTE — Progress Notes (Signed)
PROGRESS NOTE  Ruben Barrera:[id number] DOB: 1932-04-24 DOA: 02/24/2021 PCP: Lajean Manes, MD   LOS: 3 days   Brief Narrative / Interim history: 87 year old male with history of CAD, HTN, HLD, prior CVA who comes into the hospital with complaints of weakness, fatigue, fever and shortness of breath.  He was febrile to 103.5 on admission on 12/5, nausea, feeling weak and fatigued.  He was also found to have hypoxia requiring supplemental oxygen.  He was initially on BiPAP and was admitted to the ICU.  He was placed on antibiotics.  COVID, influenza, RVP all negative.  Improving, was transferred to the hospitalist on 12/7  Subjective / 24h Interval events: Feeling better today, breathing is better.  Assessment & Plan: Principal Problem:   Acute hypoxemic respiratory failure (HCC) Active Problems:   Essential hypertension   BPH (benign prostatic hyperplasia)   Mixed hyperlipidemia   Coronary artery disease involving native coronary artery of native heart without angina pectoris   Acute on chronic combined systolic and diastolic CHF (congestive heart failure) (HCC)   RLS (restless legs syndrome)   AKI (acute kidney injury) ([place])  Principal Problem Sepsis due to Streptococcus intermedius bacteremia - patient initially on broad spectrum antibiotics for unknown source, eventually blood cultures speciated strep intermedius. ID consulted. Currently on penicillin, patient had a dental work few weeks back and it is presumed that that is the cause.  -ID recommends, TEE, cardiology consulted  Active Problems Acute hypoxic respiratory failure due probably to acute on chronic CHF -Patient initially with respiratory distress requiring BiPAP, CXR showed fluid overload. He was diuresed with improvement in his respiratory status. Currently on room air.   Acute kidney injury -Baseline sCr ~0.7. Up to 1.3 on admission. Likely due to sepsis. Initially received fluids now diuresed. Cr stable today    Acute on chronic HFrEF (EF 40-45%, G1DD), CAD -received fluids on presentation and also had mild superimposed interstitial edema on the chest x-ray. Sepsis physiology resolved now diuresed. Currently euvolemic, on room air  Essential hypertension-continue home metoprolol  Hx of depression- Resume home celexa and ativan prn    Chronic pain 2/2 lumbar stenosis  RLS - Resume home Norco and gabapentin    BPH - Resume home tamsulosin   Hyperlipidemia-continue statin  Scheduled Meds:  aspirin EC  81 mg Oral Daily   chlorhexidine  15 mL Mouth Rinse BID   Chlorhexidine Gluconate Cloth  6 each Topical Daily   citalopram  20 mg Oral Daily   gabapentin  600 mg Oral TID   mouth rinse  15 mL Mouth Rinse q12n4p   metoprolol succinate  25 mg Oral Daily   pantoprazole  40 mg Oral Daily   pravastatin  20 mg Oral q1800   tamsulosin  0.4 mg Oral Daily   Continuous Infusions:  pencillin G potassium IV     PRN Meds:.acetaminophen, albuterol, docusate sodium, HYDROcodone-acetaminophen, LORazepam, ondansetron (ZOFRAN) IV, polyethylene glycol  Diet Orders (From admission, onward)     Start     Ordered   02/25/21 0958  Diet Heart Room service appropriate? Yes; Fluid consistency: Thin  Diet effective now       Question Answer Comment  Room service appropriate? Yes   Fluid consistency: Thin      02/25/21 0957            DVT prophylaxis: SCDs Start: 02/24/21 2258     Code Status: Full Code  Family Communication: no family at bedside   Status is: Inpatient  Remains inpatient appropriate because: bacteremia  Level of care: Med-Surg  Consultants:  none  Procedures:  2D echo  Microbiology  Blood cultures 12/6 GPC  Antimicrobials: Cefepime 12/6 >>    Objective: Vitals:   02/26/21 1449 02/26/21 2037 02/27/21 0534 02/27/21 0852  BP: 120/66 (!) 142/62 (!) 174/93 (!) 155/67  Pulse: 80 (!) 50 84 (!) 44  Resp:  16 15 18   Temp:  97.8 F (36.6 C) 98.3 F (36.8 C) 98.8 F  (37.1 C)  TempSrc:    Oral  SpO2: 97% 98% 93% 98%  Weight:      Height:        Intake/Output Summary (Last 24 hours) at 02/27/2021 1424 Last data filed at 02/27/2021 7619 Gross per 24 hour  Intake 100 ml  Output 500 ml  Net -400 ml    Filed Weights   02/24/21 1838 02/25/21 0100 02/26/21 0500  Weight: 82.6 kg 83.7 kg 83.5 kg    Examination:  Constitutional: nad Eyes: anicteric ENMT: mmm Neck: normal, supple Respiratory: CTA biL, no wheezing Cardiovascular: rrr, no mrg, no edema Abdomen: soft, nt, nd, bs+ Musculoskeletal: no clubbing / cyanosis.  Skin: no rashes Neurologic: non focal   Data Reviewed: I have independently reviewed following labs and imaging studies   CBC: Recent Labs  Lab 02/24/21 1850 02/24/21 1921 02/24/21 2012 02/25/21 0022 02/25/21 0137 02/26/21 0325 02/27/21 0240  WBC 16.2*  --   --  24.2* 26.2* 19.0* 11.6*  NEUTROABS 14.4*  --   --   --   --   --   --   HGB 16.4   < > 12.2* 14.3 14.9 14.0 13.7  HCT 50.1   < > 36.0* 43.9 44.1 42.0 40.8  MCV 96.7  --   --  98.2 95.7 97.0 96.0  PLT 223  --   --  184 190 179 193   < > = values in this interval not displayed.    Basic Metabolic Panel: Recent Labs  Lab 02/25/21 0137 02/25/21 1349 02/25/21 2237 02/26/21 0325 02/27/21 0240  NA 134* 135 134* 135 134*  K 3.7 4.0 4.2 4.0 3.7  CL 101 99 100 100 100  CO2 21* 26 26 26 24   GLUCOSE 142* 115* 131* 115* 113*  BUN 24* 33* 33* 28* 25*  CREATININE 1.32* 1.61* 1.43* 1.36* 1.23  CALCIUM 8.3* 8.0* 8.1* 8.1* 8.4*  MG 2.1  --  2.2 2.3  --   PHOS 3.3  --   --  3.6  --     Liver Function Tests: Recent Labs  Lab 02/24/21 1850  AST 28  ALT 21  ALKPHOS 71  BILITOT 1.5*  PROT 7.8  ALBUMIN 4.1    Coagulation Profile: Recent Labs  Lab 02/24/21 1850  INR 1.2    HbA1C: No results for input(s): HGBA1C in the last 72 hours. CBG: Recent Labs  Lab 02/25/21 0105 02/25/21 0337 02/25/21 0731  GLUCAP 143* 144* 118*     Recent Results  (from the past 240 hour(s))  Resp Panel by RT-PCR (Flu A&B, Covid) Nasopharyngeal Swab     Status: None   Collection Time: 02/24/21  6:19 AM   Specimen: Nasopharyngeal Swab; Nasopharyngeal(NP) swabs in vial transport medium  Result Value Ref Range Status   SARS Coronavirus 2 by RT PCR NEGATIVE NEGATIVE Final    Comment: (NOTE) SARS-CoV-2 target nucleic acids are NOT DETECTED.  The SARS-CoV-2 RNA is generally detectable in upper respiratory specimens during the acute phase of  infection. The lowest concentration of SARS-CoV-2 viral copies this assay can detect is 138 copies/mL. A negative result does not preclude SARS-Cov-2 infection and should not be used as the sole basis for treatment or other patient management decisions. A negative result may occur with  improper specimen collection/handling, submission of specimen other than nasopharyngeal swab, presence of viral mutation(s) within the areas targeted by this assay, and inadequate number of viral copies(<138 copies/mL). A negative result must be combined with clinical observations, patient history, and epidemiological information. The expected result is Negative.  Fact Sheet for Patients:  EntrepreneurPulse.com.au  Fact Sheet for Healthcare Providers:  IncredibleEmployment.be  This test is no t yet approved or cleared by the [place] FDA and  has been authorized for detection and/or diagnosis of SARS-CoV-2 by FDA under an Emergency Use Authorization (EUA). This EUA will remain  in effect (meaning this test can be used) for the duration of the COVID-19 declaration under Section 564(b)(1) of the Act, 21 U.S.C.section 360bbb-3(b)(1), unless the authorization is terminated  or revoked sooner.       Influenza A by PCR NEGATIVE NEGATIVE Final   Influenza B by PCR NEGATIVE NEGATIVE Final    Comment: (NOTE) The Xpert Xpress SARS-CoV-2/FLU/RSV plus assay is intended as an aid in the  diagnosis of influenza from Nasopharyngeal swab specimens and should not be used as a sole basis for treatment. Nasal washings and aspirates are unacceptable for Xpert Xpress SARS-CoV-2/FLU/RSV testing.  Fact Sheet for Patients: EntrepreneurPulse.com.au  Fact Sheet for Healthcare Providers: IncredibleEmployment.be  This test is not yet approved or cleared by the [place] FDA and has been authorized for detection and/or diagnosis of SARS-CoV-2 by FDA under an Emergency Use Authorization (EUA). This EUA will remain in effect (meaning this test can be used) for the duration of the COVID-19 declaration under Section 564(b)(1) of the Act, 21 U.S.C. section 360bbb-3(b)(1), unless the authorization is terminated or revoked.  Performed at [facility], [place] [address] [place], [place] [address]   Resp Panel by RT-PCR (Flu A&B, Covid) Nasopharyngeal Swab     Status: None   Collection Time: 02/24/21  6:44 PM   Specimen: Nasopharyngeal Swab; Nasopharyngeal(NP) swabs in vial transport medium  Result Value Ref Range Status   SARS Coronavirus 2 by RT PCR NEGATIVE NEGATIVE Final    Comment: (NOTE) SARS-CoV-2 target nucleic acids are NOT DETECTED.  The SARS-CoV-2 RNA is generally detectable in upper respiratory specimens during the acute phase of infection. The lowest concentration of SARS-CoV-2 viral copies this assay can detect is 138 copies/mL. A negative result does not preclude SARS-Cov-2 infection and should not be used as the sole basis for treatment or other patient management decisions. A negative result may occur with  improper specimen collection/handling, submission of specimen other than nasopharyngeal swab, presence of viral mutation(s) within the areas targeted by this assay, and inadequate number of viral copies(<138 copies/mL). A negative result must be combined with clinical observations, patient history, and  epidemiological information. The expected result is Negative.  Fact Sheet for Patients:  EntrepreneurPulse.com.au  Fact Sheet for Healthcare Providers:  IncredibleEmployment.be  This test is no t yet approved or cleared by the [place] FDA and  has been authorized for detection and/or diagnosis of SARS-CoV-2 by FDA under an Emergency Use Authorization (EUA). This EUA will remain  in effect (meaning this test can be used) for the duration of the COVID-19 declaration under Section 564(b)(1) of the Act, 21 U.S.C.section 360bbb-3(b)(1), unless the  authorization is terminated  or revoked sooner.       Influenza A by PCR NEGATIVE NEGATIVE Final   Influenza B by PCR NEGATIVE NEGATIVE Final    Comment: (NOTE) The Xpert Xpress SARS-CoV-2/FLU/RSV plus assay is intended as an aid in the diagnosis of influenza from Nasopharyngeal swab specimens and should not be used as a sole basis for treatment. Nasal washings and aspirates are unacceptable for Xpert Xpress SARS-CoV-2/FLU/RSV testing.  Fact Sheet for Patients: EntrepreneurPulse.com.au  Fact Sheet for Healthcare Providers: IncredibleEmployment.be  This test is not yet approved or cleared by the [place] FDA and has been authorized for detection and/or diagnosis of SARS-CoV-2 by FDA under an Emergency Use Authorization (EUA). This EUA will remain in effect (meaning this test can be used) for the duration of the COVID-19 declaration under Section 564(b)(1) of the Act, 21 U.S.C. section 360bbb-3(b)(1), unless the authorization is terminated or revoked.  Performed at [facility], [place] [address], [place], [place] [address]   Culture, blood (Routine x 2)     Status: Abnormal   Collection Time: 02/24/21  6:50 PM   Specimen: BLOOD  Result Value Ref Range Status   Specimen Description BLOOD RIGHT ANTECUBITAL  Final   Special Requests   Final     BOTTLES DRAWN AEROBIC AND ANAEROBIC Blood Culture adequate volume   Culture  Setup Time   Final    GRAM POSITIVE COCCI IN BOTH AEROBIC AND ANAEROBIC BOTTLES CRITICAL RESULT CALLED TO, READ BACK BY AND VERIFIED WITH: PHARMD E.BREWINGTON AT 1600 ON 02/25/2021 BY EP. Performed at [facility], [place] [address] [place], [place] [address]    Culture STREPTOCOCCUS INTERMEDIUS (A)  Final   Report Status 02/27/2021 FINAL  Final   Organism ID, Bacteria STREPTOCOCCUS INTERMEDIUS  Final      Susceptibility   Streptococcus intermedius - MIC*    PENICILLIN <=0.06 SENSITIVE Sensitive     CEFTRIAXONE <=0.12 SENSITIVE Sensitive     ERYTHROMYCIN <=0.12 SENSITIVE Sensitive     LEVOFLOXACIN 0.5 SENSITIVE Sensitive     VANCOMYCIN 0.25 SENSITIVE Sensitive     * STREPTOCOCCUS INTERMEDIUS  Blood Culture ID Panel (Reflexed)     Status: Abnormal   Collection Time: 02/24/21  6:50 PM  Result Value Ref Range Status   Enterococcus faecalis NOT DETECTED NOT DETECTED Final   Enterococcus Faecium NOT DETECTED NOT DETECTED Final   Listeria monocytogenes NOT DETECTED NOT DETECTED Final   Staphylococcus species NOT DETECTED NOT DETECTED Final   Staphylococcus aureus (BCID) NOT DETECTED NOT DETECTED Final   Staphylococcus epidermidis NOT DETECTED NOT DETECTED Final   Staphylococcus lugdunensis NOT DETECTED NOT DETECTED Final   Streptococcus species DETECTED (A) NOT DETECTED Final    Comment: Not Enterococcus species, Streptococcus agalactiae, Streptococcus pyogenes, or Streptococcus pneumoniae. CRITICAL RESULT CALLED TO, READ BACK BY AND VERIFIED WITH: PHARMD EDEN BREWINGTON 02/25/2021 @1854  BY JW    Streptococcus agalactiae NOT DETECTED NOT DETECTED Final   Streptococcus pneumoniae NOT DETECTED NOT DETECTED Final   Streptococcus pyogenes NOT DETECTED NOT DETECTED Final   A.calcoaceticus-baumannii NOT DETECTED NOT DETECTED Final   Bacteroides fragilis NOT DETECTED NOT DETECTED Final   Enterobacterales NOT  DETECTED NOT DETECTED Final   Enterobacter cloacae complex NOT DETECTED NOT DETECTED Final   Escherichia coli NOT DETECTED NOT DETECTED Final   Klebsiella aerogenes NOT DETECTED NOT DETECTED Final   Klebsiella oxytoca NOT DETECTED NOT DETECTED Final   Klebsiella pneumoniae NOT DETECTED NOT DETECTED Final   Proteus  species NOT DETECTED NOT DETECTED Final   Salmonella species NOT DETECTED NOT DETECTED Final   Serratia marcescens NOT DETECTED NOT DETECTED Final   Haemophilus influenzae NOT DETECTED NOT DETECTED Final   Neisseria meningitidis NOT DETECTED NOT DETECTED Final   Pseudomonas aeruginosa NOT DETECTED NOT DETECTED Final   Stenotrophomonas maltophilia NOT DETECTED NOT DETECTED Final   Candida albicans NOT DETECTED NOT DETECTED Final   Candida auris NOT DETECTED NOT DETECTED Final   Candida glabrata NOT DETECTED NOT DETECTED Final   Candida krusei NOT DETECTED NOT DETECTED Final   Candida parapsilosis NOT DETECTED NOT DETECTED Final   Candida tropicalis NOT DETECTED NOT DETECTED Final   Cryptococcus neoformans/gattii NOT DETECTED NOT DETECTED Final    Comment: Performed at [facility], [place] [address], [place], [place] [address]  Culture, blood (Routine x 2)     Status: Abnormal   Collection Time: 02/24/21  6:59 PM   Specimen: BLOOD LEFT FOREARM  Result Value Ref Range Status   Specimen Description BLOOD LEFT FOREARM  Final   Special Requests   Final    BOTTLES DRAWN AEROBIC AND ANAEROBIC Blood Culture adequate volume   Culture  Setup Time   Final    GRAM POSITIVE COCCI IN BOTH AEROBIC AND ANAEROBIC BOTTLES CRITICAL VALUE NOTED.  VALUE IS CONSISTENT WITH PREVIOUSLY REPORTED AND CALLED VALUE.    Culture (A)  Final    STREPTOCOCCUS INTERMEDIUS SUSCEPTIBILITIES PERFORMED ON PREVIOUS CULTURE WITHIN THE LAST 5 DAYS. Performed at [facility], [place] [address] [place], [place] [address]    Report Status 02/27/2021 FINAL  Final  MRSA Next Gen by PCR, Nasal     Status:  None   Collection Time: 02/25/21  3:16 AM   Specimen: Nasal Mucosa; Nasal Swab  Result Value Ref Range Status   MRSA by PCR Next Gen NOT DETECTED NOT DETECTED Final    Comment: (NOTE) The GeneXpert MRSA Assay (FDA approved for NASAL specimens only), is one component of a comprehensive MRSA colonization surveillance program. It is not intended to diagnose MRSA infection nor to guide or monitor treatment for MRSA infections. Test performance is not FDA approved in patients less than 70 years old. Performed at [facility], [place] [address], [place], [place] [address]   Respiratory (~20 pathogens) panel by PCR     Status: None   Collection Time: 02/25/21 10:30 AM   Specimen: Nasopharyngeal Swab; Respiratory  Result Value Ref Range Status   Adenovirus NOT DETECTED NOT DETECTED Final   Coronavirus 229E NOT DETECTED NOT DETECTED Final    Comment: (NOTE) The Coronavirus on the Respiratory Panel, DOES NOT test for the novel  Coronavirus (2019 nCoV)    Coronavirus HKU1 NOT DETECTED NOT DETECTED Final   Coronavirus NL63 NOT DETECTED NOT DETECTED Final   Coronavirus OC43 NOT DETECTED NOT DETECTED Final   Metapneumovirus NOT DETECTED NOT DETECTED Final   Rhinovirus / Enterovirus NOT DETECTED NOT DETECTED Final   Influenza A NOT DETECTED NOT DETECTED Final   Influenza B NOT DETECTED NOT DETECTED Final   Parainfluenza Virus 1 NOT DETECTED NOT DETECTED Final   Parainfluenza Virus 2 NOT DETECTED NOT DETECTED Final   Parainfluenza Virus 3 NOT DETECTED NOT DETECTED Final   Parainfluenza Virus 4 NOT DETECTED NOT DETECTED Final   Respiratory Syncytial Virus NOT DETECTED NOT DETECTED Final   Bordetella pertussis NOT DETECTED NOT DETECTED Final   Bordetella Parapertussis NOT DETECTED NOT DETECTED Final   Chlamydophila pneumoniae  NOT DETECTED NOT DETECTED Final   Mycoplasma pneumoniae NOT DETECTED NOT DETECTED Final    Comment: Performed at [facility], [place] [address], [place],  [place] [address]      Radiology Studies: No results found.   Marzetta Board, MD, PhD [facility]  Between 7 am - 7 pm I am available, please contact me via Amion (for emergencies) or Securechat (non urgent messages)  Between 7 pm - 7 am I am not available, please contact night coverage MD/APP via Amion

## 2021-02-27 NOTE — Care Management Important Message (Signed)
Important Message  Patient Details  Name: Ruben Barrera MRN: [id number] Date of Birth: 04-07-1932   Medicare Important Message Given:  Yes     Hannah Beat 02/27/2021, 11:17 AM

## 2021-02-27 NOTE — Progress Notes (Signed)
    [facility] has been requested to perform a transesophageal echocardiogram on 02/28/21 for Bacteremia.  After careful review of history and examination, the risks and benefits of transesophageal echocardiogram have been explained including risks of esophageal damage, perforation (1:10,000 risk), bleeding, pharyngeal hematoma as well as other potential complications associated with conscious sedation including aspiration, arrhythmia, respiratory failure and death. Alternatives to treatment were discussed, questions were answered. Patient is willing to proceed. Labs and vital stable.   Reino Bellis, NP-C 02/27/2021 3:35 PM

## 2021-02-27 NOTE — Plan of Care (Signed)

## 2021-02-27 NOTE — Progress Notes (Signed)
Physical Therapy Treatment Patient Details Name: Ruben Barrera MRN: [id number] DOB: 1932/05/11 Today's Date: 02/27/2021   History of Present Illness 87 yo male admitted 12/5 with tachypnea, respiratory failure and CAP. PMhx: HFpEF, CAD, AS, R CVA, HTN, HLD, RLS    PT Comments    The pt continues to present with deficits in dynamic stability, endurance, and safety awareness at this time. He required minG-minA to ambulate 100 ft in hallway without AD or UE support but did have x3 LOB requiring minA to correct. The pt also had SpO2 decrease from 95% on RA at rest to 84% after ambulation, recovered to 92% with guided breathing and standing rest. We discussed role of HHPT to improve dynamic stability and endurance, as well as monitor SpO2 with exertion as pt attempts to return to activity regimen, and importance of use of RW for BUE support at this time to reduce risk of falls with mobility in the home. The pt is at significant risk of falls currently, scoring only 6/24 on DGI with multiple LOB with challenges during the testing. Will continue to benefit from skilled PT acutely and following d/c to improve stability and activity tolerance.   Dynamic Gait Index (DGI): 6/24 (<19 indicates increased risk for falls)    Recommendations for follow up therapy are one component of a multi-disciplinary discharge planning process, led by the attending physician.  Recommendations may be updated based on patient status, additional functional criteria and insurance authorization.  Follow Up Recommendations  Home health PT     Assistance Recommended at Discharge Frequent or constant Supervision/Assistance  Equipment Recommendations  None recommended by PT (pt reports well equipped)    Recommendations for Other Services       Precautions / Restrictions Precautions Precautions: Fall Precaution Comments: watch O2 Restrictions Weight Bearing Restrictions: No     Mobility  Bed Mobility Overal bed  mobility: Modified Independent             General bed mobility comments: no assist or increased time    Transfers Overall transfer level: Needs assistance Equipment used: None Transfers: Sit to/from Stand;Bed to chair/wheelchair/BSC Sit to Stand: Supervision     Step pivot transfers: Supervision     General transfer comment: supervision for safety, pt with mild instability in standing, reaching for UE support.    Ambulation/Gait Ambulation/Gait assistance: Min guard Gait Distance (Feet): 100 Feet (+ 100 ft) Assistive device: None Gait Pattern/deviations: Decreased step length - right;Narrow base of support;Decreased stride length Gait velocity: decreased Gait velocity interpretation: <1.31 ft/sec, indicative of household ambulator   General Gait Details: pt with increased lateral sway and x3 minor LOB needing minA to steady. discussed use of RW after return home to improve safety. Pt desat to 85% on RA after 100 ft ambulation, recovered to 92% with standing rest, then dropped to 86% with return to room where he donned 1L O2 and SpO2 increased to 97%       Balance Overall balance assessment: Needs assistance Sitting-balance support: Single extremity supported;Bilateral upper extremity supported;Feet supported Sitting balance-Leahy Scale: Good Sitting balance - Comments: able to reach outside BOS   Standing balance support: No upper extremity supported Standing balance-Leahy Scale: Fair Standing balance comment: can static stand without assist, minG to minA with ambulation. pt reaching for UE support to steady.                 Standardized Balance Assessment Standardized Balance Assessment : Dynamic Gait Index   Dynamic Gait Index  Level Surface: Moderate Impairment Change in Gait Speed: Moderate Impairment Gait with Horizontal Head Turns: Moderate Impairment Gait with Vertical Head Turns: Severe Impairment Gait and Pivot Turn: Severe Impairment Step Over  Obstacle: Moderate Impairment Step Around Obstacles: Moderate Impairment Steps: Moderate Impairment Total Score: 6      Cognition Arousal/Alertness: Awake/alert Behavior During Therapy: WFL for tasks assessed/performed Overall Cognitive Status: Within Functional Limits for tasks assessed                                 General Comments: pt following all commands, but demos slightly impaired safety awareness, decreased insight to need for assist and fall risk        Exercises      General Comments General comments (skin integrity, edema, etc.): SpO2 94% on RA at rest, low of 84% after 100 ft gait, returned to 90s with standing rest and PLB, increased to 97% on 1L O2      Pertinent Vitals/Pain Pain Assessment: No/denies pain Pain Intervention(s): Monitored during session     PT Goals (current goals can now be found in the care plan section) Acute Rehab PT Goals Patient Stated Goal: return home today Progress towards PT goals: Progressing toward goals    Frequency    Min 3X/week      PT Plan Current plan remains appropriate       AM-PAC PT "6 Clicks" Mobility   Outcome Measure  Help needed turning from your back to your side while in a flat bed without using bedrails?: None Help needed moving from lying on your back to sitting on the side of a flat bed without using bedrails?: None Help needed moving to and from a bed to a chair (including a wheelchair)?: A Little Help needed standing up from a chair using your arms (e.g., wheelchair or bedside chair)?: A Little Help needed to walk in hospital room?: A Little Help needed climbing 3-5 steps with a railing? : A Little 6 Click Score: 20    End of Session Equipment Utilized During Treatment: Gait belt Activity Tolerance: Patient tolerated treatment well Patient left: in chair;with call bell/phone within reach;with chair alarm set;with family/visitor present Nurse Communication: Mobility status PT  Visit Diagnosis: Unsteadiness on feet (R26.81);Other abnormalities of gait and mobility (R26.89);Repeated falls (R29.6);History of falling (Z91.81)     Time: 2025-4270 PT Time Calculation (min) (ACUTE ONLY): 37 min  Charges:  $Gait Training: 23-37 mins $Therapeutic Activity: 8-22 mins                     West Carbo, PT, DPT   Acute Rehabilitation Department Pager #: [phone]   Sandra Cockayne 02/27/2021, 9:26 AM

## 2021-02-27 NOTE — Progress Notes (Addendum)
Pt had episodes of confusion per shift. Pt woke up screaming and yelling at staff. Pt also called wife and told her that we hadn't been in room all shift. Wife called and spoke with this RN, she was on phone during whole conversation. She stated that pt isn't normally like this. Pt wouldn't answer admission questions.   Pt is calmed down and didn't want to be bothered.

## 2021-02-28 ENCOUNTER — Inpatient Hospital Stay (HOSPITAL_COMMUNITY): Payer: PPO | Admitting: Certified Registered Nurse Anesthetist

## 2021-02-28 ENCOUNTER — Encounter (HOSPITAL_COMMUNITY): Payer: Self-pay | Admitting: Pulmonary Disease

## 2021-02-28 ENCOUNTER — Encounter (HOSPITAL_COMMUNITY)
Admission: EM | Disposition: A | Discharge status: Home / Self Care | Payer: Self-pay | Source: Home / Self Care | Attending: Internal Medicine

## 2021-02-28 ENCOUNTER — Inpatient Hospital Stay (HOSPITAL_COMMUNITY): Payer: PPO

## 2021-02-28 DIAGNOSIS — R7881 Bacteremia: Principal | ICD-10-CM | POA: Insufficient documentation

## 2021-02-28 DIAGNOSIS — B955 Unspecified streptococcus as the cause of diseases classified elsewhere: Secondary | ICD-10-CM | POA: Insufficient documentation

## 2021-02-28 DIAGNOSIS — I340 Nonrheumatic mitral (valve) insufficiency: Secondary | ICD-10-CM

## 2021-02-28 HISTORY — PX: TEE WITHOUT CARDIOVERSION: SHX5443

## 2021-02-28 LAB — ECHO TEE
AR max vel: 2.92 cm2
AV Area VTI: 1.72 cm2
AV Area mean vel: 2.97 cm2
AV Mean grad: 21 mmHg
AV Peak grad: 38.2 mmHg
Ao pk vel: 3.09 m/s

## 2021-02-28 SURGERY — ECHOCARDIOGRAM, TRANSESOPHAGEAL
Anesthesia: Monitor Anesthesia Care

## 2021-02-28 MED ORDER — PROPOFOL 500 MG/50ML IV EMUL
INTRAVENOUS | Status: DC | PRN
Start: 2021-02-28 — End: 2021-02-28
  Administered 2021-02-28: 100 ug/kg/min via INTRAVENOUS

## 2021-02-28 MED ORDER — PROPOFOL 10 MG/ML IV BOLUS
INTRAVENOUS | Status: DC | PRN
  Administered 2021-02-28: 10 mg via INTRAVENOUS
  Administered 2021-02-28: 20 mg via INTRAVENOUS
  Administered 2021-02-28: 10 mg via INTRAVENOUS

## 2021-02-28 MED ORDER — LIDOCAINE 2% (20 MG/ML) 5 ML SYRINGE
INTRAMUSCULAR | Status: DC | PRN
  Administered 2021-02-28: 100 mg via INTRAVENOUS

## 2021-02-28 MED ORDER — PENICILLIN G POTASSIUM IV (FOR PTA / DISCHARGE USE ONLY): 24.0000 10*6.[IU] | INTRAVENOUS | 0 refills | Status: DC

## 2021-02-28 MED ORDER — SODIUM CHLORIDE 0.9 % IV SOLN: INTRAVENOUS | Status: DC

## 2021-02-28 NOTE — Progress Notes (Deleted)
*  PRELIMINARY RESULTS* Echocardiogram Echocardiogram Transesophageal has been performed.  Ruben Barrera 02/28/2021, 5:46 PM

## 2021-02-28 NOTE — Anesthesia Procedure Notes (Signed)
Procedure Name: MAC Date/Time: 02/28/2021 3:00 PM Performed by: Moshe Salisbury, CRNA Pre-anesthesia Checklist: Patient identified, Emergency Drugs available, Suction available and Patient being monitored Oxygen Delivery Method: Nasal cannula Placement Confirmation: positive ETCO2 Dental Injury: Teeth and Oropharynx as per pre-operative assessment

## 2021-02-28 NOTE — H&P (View-Only) (Signed)
[facility] for Infectious Disease  Date of Admission:  02/24/2021   Total days of inpatient antibiotics 5  Principal Problem:   Acute hypoxemic respiratory failure (HCC) Active Problems:   Essential hypertension   BPH (benign prostatic hyperplasia)   Mixed hyperlipidemia   Coronary artery disease involving native coronary artery of native heart without angina pectoris   Acute on chronic combined systolic and diastolic CHF (congestive heart failure) (HCC)   RLS (restless legs syndrome)   AKI (acute kidney injury) ([place])          Assessment: 88 YM with spinal stimulator for chronic back pain, HFrEF presented with respiratory distress and fever. Initially placed on Bipap in the ICU, now on room air. ID engaged per strep intermedius bacteremia.   #Strep intermedius bacteremia 2/2 seeding from dental procedure -Patient reported he has not been feeling well for the past couple weeks. He stated he had a dental procedure done for a tooth infection about 3-4 weeks. Suspect this was the event that led to bacteremia. He has likely been bacteremic for the past 2 weeks. As such recommend further evaluation with TEE(TTE showed no vegetations) to look for endocarditis.  Of note he has spina stimulator, I would have a low threshold to image his back as well. He has not complained of back pain and spinal stimulator site is not concerning for infection on exam.  Recommendations:  -Continue Penicillin -Scheduled for TEE today -Follow repeat blood Cx from 12/8 to ensure clearance. IF tee negative complete 2 weeks of IV antibiotics from negative Cx(12/8-12/21).  -Follow-up with ID scheduled.    #Acute Respiratory failure with concern for community acquired pneumonia vs acute on chronic heart failure -CXR showed diffuse interstitial opacity likely representing edema -Diuresed with lasix, he had recieved cefepime(2d)->ceftriaxone(1d) -Based on imaging and now the etiology of fever is thought  to be bacteremia, I am suspect respiratory distress was 2/2 heart failure.    Microbiology:   Antibiotics: Cefepime 12/5-12/6 Ceftriaxone 12/7 Vancomycin 12/5-12/6 Penicillin 12/8-p   Cultures: Blood 02/24/21 blood Cx 2/2-strep intermedius  SUBJECTIVE: Pt is sitting in chair, wife at bedside. He is inquiring about discharge planning. I relayed that we are awsiting TEE and blood cultures.   Interval: Afebrile overnight.  Review of Systems: Review of Systems  All other systems reviewed and are negative.   Scheduled Meds:  aspirin EC  81 mg Oral Daily   chlorhexidine  15 mL Mouth Rinse BID   Chlorhexidine Gluconate Cloth  6 each Topical Daily   citalopram  20 mg Oral Daily   gabapentin  600 mg Oral TID   mouth rinse  15 mL Mouth Rinse q12n4p   metoprolol succinate  25 mg Oral Daily   pantoprazole  40 mg Oral Daily   pravastatin  20 mg Oral q1800   tamsulosin  0.4 mg Oral Daily   Continuous Infusions:  pencillin G potassium IV 4 Million Units (02/28/21 0901)   PRN Meds:.acetaminophen, albuterol, docusate sodium, HYDROcodone-acetaminophen, LORazepam, ondansetron (ZOFRAN) IV, polyethylene glycol Allergies  Allergen Reactions   Simvastatin Other (See Comments)    Leg pains    Atorvastatin Other (See Comments)    Muscle aches   Benazepril Hcl Other (See Comments)    OBJECTIVE: Vitals:   02/27/21 2000 02/28/21 0519 02/28/21 1011 02/28/21 1206  BP: (!) 186/90 (!) 158/104 119/70 (!) 153/62  Pulse: 73 (!) 56 60 (!) 56  Resp: 20 18 16 19   Temp: 97.8  F (36.6 C) 98.1 F (36.7 C) 98.6 F (37 C) 97.9 F (36.6 C)  TempSrc: Oral  Oral Oral  SpO2: 95% (!) 85% 95% 90%  Weight:      Height:       Body mass index is 27.99 kg/m.  Physical Exam Constitutional:      General: He is not in acute distress.    Appearance: He is normal weight. He is not toxic-appearing.  HENT:     Head: Normocephalic and atraumatic.     Right Ear: External ear normal.     Left Ear:  External ear normal.     Nose: No congestion or rhinorrhea.     Mouth/Throat:     Mouth: Mucous membranes are moist.     Pharynx: Oropharynx is clear.  Eyes:     Extraocular Movements: Extraocular movements intact.     Conjunctiva/sclera: Conjunctivae normal.     Pupils: Pupils are equal, round, and reactive to light.  Cardiovascular:     Rate and Rhythm: Normal rate and regular rhythm.     Heart sounds: No murmur heard.   No friction rub. No gallop.  Pulmonary:     Effort: Pulmonary effort is normal.     Breath sounds: Normal breath sounds.  Abdominal:     General: Abdomen is flat. Bowel sounds are normal.     Palpations: Abdomen is soft.  Musculoskeletal:        General: No swelling. Normal range of motion.     Cervical back: Normal range of motion and neck supple.  Skin:    General: Skin is warm and dry.  Neurological:     General: No focal deficit present.     Mental Status: He is oriented to person, place, and time.  Psychiatric:        Mood and Affect: Mood normal.      Lab Results Lab Results  Component Value Date   WBC 11.6 (H) 02/27/2021   HGB 13.7 02/27/2021   HCT 40.8 02/27/2021   MCV 96.0 02/27/2021   PLT 193 02/27/2021    Lab Results  Component Value Date   CREATININE 1.23 02/27/2021   BUN 25 (H) 02/27/2021   NA 134 (L) 02/27/2021   K 3.7 02/27/2021   CL 100 02/27/2021   CO2 24 02/27/2021    Lab Results  Component Value Date   ALT 21 02/24/2021   AST 28 02/24/2021   ALKPHOS 71 02/24/2021   BILITOT 1.5 (H) 02/24/2021        Laurice Record, [facility] 02/28/2021, 1:45 PM

## 2021-02-28 NOTE — Plan of Care (Signed)
  Problem: Education: Goal: Knowledge of General Education information will improve Description: Including pain rating scale, medication(s)/side effects and non-pharmacologic comfort measures Outcome: Progressing   Problem: Pain Managment: Goal: General experience of comfort will improve Outcome: Progressing   Problem: Safety: Goal: Ability to remain free from injury will improve Outcome: Progressing   

## 2021-02-28 NOTE — Transfer of Care (Signed)
Immediate Anesthesia Transfer of Care Note  Patient: KACEN MELLINGER  Procedure(s) Performed: TRANSESOPHAGEAL ECHOCARDIOGRAM (TEE)  Patient Location: Endoscopy Unit  Anesthesia Type:MAC  Level of Consciousness: drowsy and patient cooperative  Airway & Oxygen Therapy: Patient Spontanous Breathing and Patient connected to nasal cannula oxygen  Post-op Assessment: Report given to RN, Post -op Vital signs reviewed and stable and Patient moving all extremities  Post vital signs: Reviewed and stable  Last Vitals:  Vitals Value Taken Time  BP 144/71 02/28/21 1532  Temp    Pulse 91 02/28/21 1532  Resp 38 02/28/21 1532  SpO2 97 % 02/28/21 1532  Vitals shown include unvalidated device data.  Last Pain:  Vitals:   02/28/21 1420  TempSrc: Temporal  PainSc: 0-No pain         Complications: No notable events documented.

## 2021-02-28 NOTE — Progress Notes (Signed)
[facility] for Infectious Disease  Date of Admission:  02/24/2021   Total days of inpatient antibiotics 5  Principal Problem:   Acute hypoxemic respiratory failure (HCC) Active Problems:   Essential hypertension   BPH (benign prostatic hyperplasia)   Mixed hyperlipidemia   Coronary artery disease involving native coronary artery of native heart without angina pectoris   Acute on chronic combined systolic and diastolic CHF (congestive heart failure) (HCC)   RLS (restless legs syndrome)   AKI (acute kidney injury) ([place])          Assessment: 88 YM with spinal stimulator for chronic back pain, HFrEF presented with respiratory distress and fever. Initially placed on Bipap in the ICU, now on room air. ID engaged per strep intermedius bacteremia.   #Strep intermedius bacteremia 2/2 seeding from dental procedure -Patient reported he has not been feeling well for the past couple weeks. He stated he had a dental procedure done for a tooth infection about 3-4 weeks. Suspect this was the event that led to bacteremia. He has likely been bacteremic for the past 2 weeks. As such recommend further evaluation with TEE(TTE showed no vegetations) to look for endocarditis.  Of note he has spina stimulator, I would have a low threshold to image his back as well. He has not complained of back pain and spinal stimulator site is not concerning for infection on exam.  Recommendations:  -Continue Penicillin -Scheduled for TEE today -Follow repeat blood Cx from 12/8 to ensure clearance. IF tee negative complete 2 weeks of IV antibiotics from negative Cx(12/8-12/21).  -Follow-up with ID scheduled.    #Acute Respiratory failure with concern for community acquired pneumonia vs acute on chronic heart failure -CXR showed diffuse interstitial opacity likely representing edema -Diuresed with lasix, he had recieved cefepime(2d)->ceftriaxone(1d) -Based on imaging and now the etiology of fever is thought  to be bacteremia, I am suspect respiratory distress was 2/2 heart failure.    Microbiology:   Antibiotics: Cefepime 12/5-12/6 Ceftriaxone 12/7 Vancomycin 12/5-12/6 Penicillin 12/8-p   Cultures: Blood 02/24/21 blood Cx 2/2-strep intermedius  SUBJECTIVE: Pt is sitting in chair, wife at bedside. He is inquiring about discharge planning. I relayed that we are awsiting TEE and blood cultures.   Interval: Afebrile overnight.  Review of Systems: Review of Systems  All other systems reviewed and are negative.   Scheduled Meds:  aspirin EC  81 mg Oral Daily   chlorhexidine  15 mL Mouth Rinse BID   Chlorhexidine Gluconate Cloth  6 each Topical Daily   citalopram  20 mg Oral Daily   gabapentin  600 mg Oral TID   mouth rinse  15 mL Mouth Rinse q12n4p   metoprolol succinate  25 mg Oral Daily   pantoprazole  40 mg Oral Daily   pravastatin  20 mg Oral q1800   tamsulosin  0.4 mg Oral Daily   Continuous Infusions:  pencillin G potassium IV 4 Million Units (02/28/21 0901)   PRN Meds:.acetaminophen, albuterol, docusate sodium, HYDROcodone-acetaminophen, LORazepam, ondansetron (ZOFRAN) IV, polyethylene glycol Allergies  Allergen Reactions   Simvastatin Other (See Comments)    Leg pains    Atorvastatin Other (See Comments)    Muscle aches   Benazepril Hcl Other (See Comments)    OBJECTIVE: Vitals:   02/27/21 2000 02/28/21 0519 02/28/21 1011 02/28/21 1206  BP: (!) 186/90 (!) 158/104 119/70 (!) 153/62  Pulse: 73 (!) 56 60 (!) 56  Resp: 20 18 16 19   Temp: 97.8  F (36.6 C) 98.1 F (36.7 C) 98.6 F (37 C) 97.9 F (36.6 C)  TempSrc: Oral  Oral Oral  SpO2: 95% (!) 85% 95% 90%  Weight:      Height:       Body mass index is 27.99 kg/m.  Physical Exam Constitutional:      General: He is not in acute distress.    Appearance: He is normal weight. He is not toxic-appearing.  HENT:     Head: Normocephalic and atraumatic.     Right Ear: External ear normal.     Left Ear:  External ear normal.     Nose: No congestion or rhinorrhea.     Mouth/Throat:     Mouth: Mucous membranes are moist.     Pharynx: Oropharynx is clear.  Eyes:     Extraocular Movements: Extraocular movements intact.     Conjunctiva/sclera: Conjunctivae normal.     Pupils: Pupils are equal, round, and reactive to light.  Cardiovascular:     Rate and Rhythm: Normal rate and regular rhythm.     Heart sounds: No murmur heard.   No friction rub. No gallop.  Pulmonary:     Effort: Pulmonary effort is normal.     Breath sounds: Normal breath sounds.  Abdominal:     General: Abdomen is flat. Bowel sounds are normal.     Palpations: Abdomen is soft.  Musculoskeletal:        General: No swelling. Normal range of motion.     Cervical back: Normal range of motion and neck supple.  Skin:    General: Skin is warm and dry.  Neurological:     General: No focal deficit present.     Mental Status: He is oriented to person, place, and time.  Psychiatric:        Mood and Affect: Mood normal.      Lab Results Lab Results  Component Value Date   WBC 11.6 (H) 02/27/2021   HGB 13.7 02/27/2021   HCT 40.8 02/27/2021   MCV 96.0 02/27/2021   PLT 193 02/27/2021    Lab Results  Component Value Date   CREATININE 1.23 02/27/2021   BUN 25 (H) 02/27/2021   NA 134 (L) 02/27/2021   K 3.7 02/27/2021   CL 100 02/27/2021   CO2 24 02/27/2021    Lab Results  Component Value Date   ALT 21 02/24/2021   AST 28 02/24/2021   ALKPHOS 71 02/24/2021   BILITOT 1.5 (H) 02/24/2021        Laurice Record, [facility] 02/28/2021, 1:45 PM

## 2021-02-28 NOTE — Progress Notes (Signed)
  Echocardiogram Echocardiogram Transesophageal has been performed.  Ruben Barrera 02/28/2021, 5:47 PM

## 2021-02-28 NOTE — Plan of Care (Signed)

## 2021-02-28 NOTE — Progress Notes (Signed)
PROGRESS NOTE  Ruben Barrera DTO:[id number] DOB: [birth year] DOA: 02/24/2021 PCP: Lajean Manes, MD   LOS: 4 days   Brief Narrative / Interim history: 87 year old male with history of CAD, HTN, HLD, prior CVA who comes into the hospital with complaints of weakness, fatigue, fever and shortness of breath.  He was febrile to 103.5 on admission on 12/5, nausea, feeling weak and fatigued.  He was also found to have hypoxia requiring supplemental oxygen.  He was initially on BiPAP and was admitted to the ICU.  He was placed on antibiotics.  COVID, influenza, RVP all negative.  Improving, was transferred to the hospitalist on 12/7  Subjective / 24h Interval events: Wants to go home.  No complaints at this point.  Worked with PT this morning  Assessment & Plan: Principal Problem:   Acute hypoxemic respiratory failure ([place]) Active Problems:   Essential hypertension   BPH (benign prostatic hyperplasia)   Mixed hyperlipidemia   Coronary artery disease involving native coronary artery of native heart without angina pectoris   Acute on chronic combined systolic and diastolic CHF (congestive heart failure) ([facility])   RLS (restless legs syndrome)   AKI (acute kidney injury) ([place])  Principal Problem Sepsis due to Streptococcus intermedius bacteremia - patient initially on broad spectrum antibiotics for unknown source, eventually blood cultures speciated strep intermedius. ID consulted. Currently on penicillin, patient had a dental work few weeks back and it is presumed that that is the cause.  -ID recommends TEE, cardiology consulted, he is to have that today -Discussed with Dr. Candiss Norse, if cultures remain negative at 48 hours tomorrow he will need a PICC line for 2 weeks of antibiotics assuming TEE is negative, otherwise he will need a longer course.  Active Problems Acute hypoxic respiratory failure due probably to acute on chronic CHF -Patient initially with respiratory distress requiring BiPAP, CXR  showed fluid overload. He was diuresed with improvement in his respiratory status.  Remains on room air this morning  Acute kidney injury -Baseline sCr ~0.7. Up to 1.3 on admission. Likely due to sepsis. Initially received fluids now diuresed.  Creatinine improved   Acute on chronic HFrEF (EF 40-45%, G1DD), CAD -received fluids on presentation and also had mild superimposed interstitial edema on the chest x-ray. Sepsis physiology resolved now diuresed. Currently euvolemic, on room air  Essential hypertension-continue home metoprolol  Hx of depression- Resume home celexa and ativan prn    Chronic pain 2/2 lumbar stenosis -spinal stimulator in place.  No evidence of infection  RLS - Resume home Norco and gabapentin    BPH - Resume home tamsulosin   Hyperlipidemia-continue statin  Scheduled Meds:  aspirin EC  81 mg Oral Daily   chlorhexidine  15 mL Mouth Rinse BID   Chlorhexidine Gluconate Cloth  6 each Topical Daily   citalopram  20 mg Oral Daily   gabapentin  600 mg Oral TID   mouth rinse  15 mL Mouth Rinse q12n4p   metoprolol succinate  25 mg Oral Daily   pantoprazole  40 mg Oral Daily   pravastatin  20 mg Oral q1800   tamsulosin  0.4 mg Oral Daily   Continuous Infusions:  pencillin G potassium IV 4 Million Units (02/28/21 0901)   PRN Meds:.acetaminophen, albuterol, docusate sodium, HYDROcodone-acetaminophen, LORazepam, ondansetron (ZOFRAN) IV, polyethylene glycol  Diet Orders (From admission, onward)     Start     Ordered   02/25/21 0958  Diet Heart Room service appropriate? Yes; Fluid consistency: Thin  Diet effective  now       Question Answer Comment  Room service appropriate? Yes   Fluid consistency: Thin      02/25/21 0957            DVT prophylaxis: SCDs Start: 02/24/21 2258     Code Status: Full Code  Family Communication: Wife present at bedside  Status is: Inpatient  Remains inpatient appropriate because: bacteremia  Level of care:  Med-Surg  Consultants:  none  Procedures:  2D echo  Microbiology  Blood cultures 12/6 GPC  Antimicrobials: Cefepime 12/6 >> 12/7 Ceftriaxone 12/7-12/8 Penicillin 12/8   Objective: Vitals:   02/27/21 0534 02/27/21 0852 02/27/21 2000 02/28/21 0519  BP: (!) 174/93 (!) 155/67 (!) 186/90 (!) 158/104  Pulse: 84 (!) 44 73 (!) 56  Resp: 15 18 20 18   Temp: 98.3 F (36.8 C) 98.8 F (37.1 C) 97.8 F (36.6 C) 98.1 F (36.7 C)  TempSrc:  Oral Oral   SpO2: 93% 98% 95% (!) 85%  Weight:      Height:       No intake or output data in the 24 hours ending 02/28/21 1003  Filed Weights   02/24/21 1838 02/25/21 0100 02/26/21 0500  Weight: 82.6 kg 83.7 kg 83.5 kg    Examination:  Constitutional: No distress Eyes: No scleral icterus ENMT: Moist mucous membranes Neck: normal, supple Respiratory: Clear to auscultation bilaterally, no wheezing heard Cardiovascular: Regular rate and rhythm, no murmurs, no peripheral edema Abdomen: Nondistended, bowel sounds positive Musculoskeletal: no clubbing / cyanosis.  Skin: No rashes seen Neurologic: No focal deficits  Data Reviewed: I have independently reviewed following labs and imaging studies   CBC: Recent Labs  Lab 02/24/21 1850 02/24/21 1921 02/24/21 2012 02/25/21 0022 02/25/21 0137 02/26/21 0325 02/27/21 0240  WBC 16.2*  --   --  24.2* 26.2* 19.0* 11.6*  NEUTROABS 14.4*  --   --   --   --   --   --   HGB 16.4   < > 12.2* 14.3 14.9 14.0 13.7  HCT 50.1   < > 36.0* 43.9 44.1 42.0 40.8  MCV 96.7  --   --  98.2 95.7 97.0 96.0  PLT 223  --   --  184 190 179 193   < > = values in this interval not displayed.    Basic Metabolic Panel: Recent Labs  Lab 02/25/21 0137 02/25/21 1349 02/25/21 2237 02/26/21 0325 02/27/21 0240  NA 134* 135 134* 135 134*  K 3.7 4.0 4.2 4.0 3.7  CL 101 99 100 100 100  CO2 21* 26 26 26 24   GLUCOSE 142* 115* 131* 115* 113*  BUN 24* 33* 33* 28* 25*  CREATININE 1.32* 1.61* 1.43* 1.36* 1.23   CALCIUM 8.3* 8.0* 8.1* 8.1* 8.4*  MG 2.1  --  2.2 2.3  --   PHOS 3.3  --   --  3.6  --     Liver Function Tests: Recent Labs  Lab 02/24/21 1850  AST 28  ALT 21  ALKPHOS 71  BILITOT 1.5*  PROT 7.8  ALBUMIN 4.1    Coagulation Profile: Recent Labs  Lab 02/24/21 1850  INR 1.2    HbA1C: No results for input(s): HGBA1C in the last 72 hours. CBG: Recent Labs  Lab 02/25/21 0105 02/25/21 0337 02/25/21 0731  GLUCAP 143* 144* 118*     Recent Results (from the past 240 hour(s))  Resp Panel by RT-PCR (Flu A&B, Covid) Nasopharyngeal Swab     Status:  None   Collection Time: 02/24/21  6:19 AM   Specimen: Nasopharyngeal Swab; Nasopharyngeal(NP) swabs in vial transport medium  Result Value Ref Range Status   SARS Coronavirus 2 by RT PCR NEGATIVE NEGATIVE Final    Comment: (NOTE) SARS-CoV-2 target nucleic acids are NOT DETECTED.  The SARS-CoV-2 RNA is generally detectable in upper respiratory specimens during the acute phase of infection. The lowest concentration of SARS-CoV-2 viral copies this assay can detect is 138 copies/mL. A negative result does not preclude SARS-Cov-2 infection and should not be used as the sole basis for treatment or other patient management decisions. A negative result may occur with  improper specimen collection/handling, submission of specimen other than nasopharyngeal swab, presence of viral mutation(s) within the areas targeted by this assay, and inadequate number of viral copies(<138 copies/mL). A negative result must be combined with clinical observations, patient history, and epidemiological information. The expected result is Negative.  Fact Sheet for Patients:  EntrepreneurPulse.com.au  Fact Sheet for Healthcare Providers:  IncredibleEmployment.be  This test is no t yet approved or cleared by the [place] FDA and  has been authorized for detection and/or diagnosis of SARS-CoV-2 by FDA under an  Emergency Use Authorization (EUA). This EUA will remain  in effect (meaning this test can be used) for the duration of the COVID-19 declaration under Section 564(b)(1) of the Act, 21 U.S.C.section 360bbb-3(b)(1), unless the authorization is terminated  or revoked sooner.       Influenza A by PCR NEGATIVE NEGATIVE Final   Influenza B by PCR NEGATIVE NEGATIVE Final    Comment: (NOTE) The Xpert Xpress SARS-CoV-2/FLU/RSV plus assay is intended as an aid in the diagnosis of influenza from Nasopharyngeal swab specimens and should not be used as a sole basis for treatment. Nasal washings and aspirates are unacceptable for Xpert Xpress SARS-CoV-2/FLU/RSV testing.  Fact Sheet for Patients: EntrepreneurPulse.com.au  Fact Sheet for Healthcare Providers: IncredibleEmployment.be  This test is not yet approved or cleared by the [place] FDA and has been authorized for detection and/or diagnosis of SARS-CoV-2 by FDA under an Emergency Use Authorization (EUA). This EUA will remain in effect (meaning this test can be used) for the duration of the COVID-19 declaration under Section 564(b)(1) of the Act, 21 U.S.C. section 360bbb-3(b)(1), unless the authorization is terminated or revoked.  Performed at [facility], [place] [address] [place], [place] [address]   Resp Panel by RT-PCR (Flu A&B, Covid) Nasopharyngeal Swab     Status: None   Collection Time: 02/24/21  6:44 PM   Specimen: Nasopharyngeal Swab; Nasopharyngeal(NP) swabs in vial transport medium  Result Value Ref Range Status   SARS Coronavirus 2 by RT PCR NEGATIVE NEGATIVE Final    Comment: (NOTE) SARS-CoV-2 target nucleic acids are NOT DETECTED.  The SARS-CoV-2 RNA is generally detectable in upper respiratory specimens during the acute phase of infection. The lowest concentration of SARS-CoV-2 viral copies this assay can detect is 138 copies/mL. A negative result does not preclude  SARS-Cov-2 infection and should not be used as the sole basis for treatment or other patient management decisions. A negative result may occur with  improper specimen collection/handling, submission of specimen other than nasopharyngeal swab, presence of viral mutation(s) within the areas targeted by this assay, and inadequate number of viral copies(<138 copies/mL). A negative result must be combined with clinical observations, patient history, and epidemiological information. The expected result is Negative.  Fact Sheet for Patients:  EntrepreneurPulse.com.au  Fact Sheet for Healthcare Providers:  IncredibleEmployment.be  This test is no t yet approved or cleared by the [place] and  has been authorized for detection and/or diagnosis of SARS-CoV-2 by FDA under an Emergency Use Authorization (EUA). This EUA will remain  in effect (meaning this test can be used) for the duration of the COVID-19 declaration under Section 564(b)(1) of the Act, 21 U.S.C.section 360bbb-3(b)(1), unless the authorization is terminated  or revoked sooner.       Influenza A by PCR NEGATIVE NEGATIVE Final   Influenza B by PCR NEGATIVE NEGATIVE Final    Comment: (NOTE) The Xpert Xpress SARS-CoV-2/FLU/RSV plus assay is intended as an aid in the diagnosis of influenza from Nasopharyngeal swab specimens and should not be used as a sole basis for treatment. Nasal washings and aspirates are unacceptable for Xpert Xpress SARS-CoV-2/FLU/RSV testing.  Fact Sheet for Patients: EntrepreneurPulse.com.au  Fact Sheet for Healthcare Providers: IncredibleEmployment.be  This test is not yet approved or cleared by the [place] FDA and has been authorized for detection and/or diagnosis of SARS-CoV-2 by FDA under an Emergency Use Authorization (EUA). This EUA will remain in effect (meaning this test can be used) for the duration of  the COVID-19 declaration under Section 564(b)(1) of the Act, 21 U.S.C. section 360bbb-3(b)(1), unless the authorization is terminated or revoked.  Performed at [facility], [place] [address] [place], [place] [address]   Culture, blood (Routine x 2)     Status: Abnormal   Collection Time: 02/24/21  6:50 PM   Specimen: BLOOD  Result Value Ref Range Status   Specimen Description BLOOD RIGHT ANTECUBITAL  Final   Special Requests   Final    BOTTLES DRAWN AEROBIC AND ANAEROBIC Blood Culture adequate volume   Culture  Setup Time   Final    GRAM POSITIVE COCCI IN BOTH AEROBIC AND ANAEROBIC BOTTLES CRITICAL RESULT CALLED TO, READ BACK BY AND VERIFIED WITH: PHARMD E.BREWINGTON AT 1600 ON 02/25/2021 BY EP. Performed at [facility], [place] [address] [place], [place] [address]    Culture STREPTOCOCCUS INTERMEDIUS (A)  Final   Report Status 02/27/2021 FINAL  Final   Organism ID, Bacteria STREPTOCOCCUS INTERMEDIUS  Final      Susceptibility   Streptococcus intermedius - MIC*    PENICILLIN <=0.06 SENSITIVE Sensitive     CEFTRIAXONE <=0.12 SENSITIVE Sensitive     ERYTHROMYCIN <=0.12 SENSITIVE Sensitive     LEVOFLOXACIN 0.5 SENSITIVE Sensitive     VANCOMYCIN 0.25 SENSITIVE Sensitive     * STREPTOCOCCUS INTERMEDIUS  Blood Culture ID Panel (Reflexed)     Status: Abnormal   Collection Time: 02/24/21  6:50 PM  Result Value Ref Range Status   Enterococcus faecalis NOT DETECTED NOT DETECTED Final   Enterococcus Faecium NOT DETECTED NOT DETECTED Final   Listeria monocytogenes NOT DETECTED NOT DETECTED Final   Staphylococcus species NOT DETECTED NOT DETECTED Final   Staphylococcus aureus (BCID) NOT DETECTED NOT DETECTED Final   Staphylococcus epidermidis NOT DETECTED NOT DETECTED Final   Staphylococcus lugdunensis NOT DETECTED NOT DETECTED Final   Streptococcus species DETECTED (A) NOT DETECTED Final    Comment: Not Enterococcus species, Streptococcus agalactiae, Streptococcus pyogenes,  or Streptococcus pneumoniae. CRITICAL RESULT CALLED TO, READ BACK BY AND VERIFIED WITH: PHARMD EDEN BREWINGTON 02/25/2021 @1854  BY JW    Streptococcus agalactiae NOT DETECTED NOT DETECTED Final   Streptococcus pneumoniae NOT DETECTED NOT DETECTED Final   Streptococcus pyogenes NOT DETECTED NOT DETECTED Final   A.calcoaceticus-baumannii NOT DETECTED NOT DETECTED Final  Bacteroides fragilis NOT DETECTED NOT DETECTED Final   Enterobacterales NOT DETECTED NOT DETECTED Final   Enterobacter cloacae complex NOT DETECTED NOT DETECTED Final   Escherichia coli NOT DETECTED NOT DETECTED Final   Klebsiella aerogenes NOT DETECTED NOT DETECTED Final   Klebsiella oxytoca NOT DETECTED NOT DETECTED Final   Klebsiella pneumoniae NOT DETECTED NOT DETECTED Final   Proteus species NOT DETECTED NOT DETECTED Final   Salmonella species NOT DETECTED NOT DETECTED Final   Serratia marcescens NOT DETECTED NOT DETECTED Final   Haemophilus influenzae NOT DETECTED NOT DETECTED Final   Neisseria meningitidis NOT DETECTED NOT DETECTED Final   Pseudomonas aeruginosa NOT DETECTED NOT DETECTED Final   Stenotrophomonas maltophilia NOT DETECTED NOT DETECTED Final   Candida albicans NOT DETECTED NOT DETECTED Final   Candida auris NOT DETECTED NOT DETECTED Final   Candida glabrata NOT DETECTED NOT DETECTED Final   Candida krusei NOT DETECTED NOT DETECTED Final   Candida parapsilosis NOT DETECTED NOT DETECTED Final   Candida tropicalis NOT DETECTED NOT DETECTED Final   Cryptococcus neoformans/gattii NOT DETECTED NOT DETECTED Final    Comment: Performed at [facility], [place] [address] [place], [place] [address]  Culture, blood (Routine x 2)     Status: Abnormal   Collection Time: 02/24/21  6:59 PM   Specimen: BLOOD LEFT FOREARM  Result Value Ref Range Status   Specimen Description BLOOD LEFT FOREARM  Final   Special Requests   Final    BOTTLES DRAWN AEROBIC AND ANAEROBIC Blood Culture adequate volume   Culture   Setup Time   Final    GRAM POSITIVE COCCI IN BOTH AEROBIC AND ANAEROBIC BOTTLES CRITICAL VALUE NOTED.  VALUE IS CONSISTENT WITH PREVIOUSLY REPORTED AND CALLED VALUE.    Culture (A)  Final    STREPTOCOCCUS INTERMEDIUS SUSCEPTIBILITIES PERFORMED ON PREVIOUS CULTURE WITHIN THE LAST 5 DAYS. Performed at [facility], [place] [address], [place], [place] [address]    Report Status 02/27/2021 FINAL  Final  MRSA Next Gen by PCR, Nasal     Status: None   Collection Time: 02/25/21  3:16 AM   Specimen: Nasal Mucosa; Nasal Swab  Result Value Ref Range Status   MRSA by PCR Next Gen NOT DETECTED NOT DETECTED Final    Comment: (NOTE) The GeneXpert MRSA Assay (FDA approved for NASAL specimens only), is one component of a comprehensive MRSA colonization surveillance program. It is not intended to diagnose MRSA infection nor to guide or monitor treatment for MRSA infections. Test performance is not FDA approved in patients less than 50 years old. Performed at [facility], [place] [address], [place], [place] [address]   Respiratory (~20 pathogens) panel by PCR     Status: None   Collection Time: 02/25/21 10:30 AM   Specimen: Nasopharyngeal Swab; Respiratory  Result Value Ref Range Status   Adenovirus NOT DETECTED NOT DETECTED Final   Coronavirus 229E NOT DETECTED NOT DETECTED Final    Comment: (NOTE) The Coronavirus on the Respiratory Panel, DOES NOT test for the novel  Coronavirus (2019 nCoV)    Coronavirus HKU1 NOT DETECTED NOT DETECTED Final   Coronavirus NL63 NOT DETECTED NOT DETECTED Final   Coronavirus OC43 NOT DETECTED NOT DETECTED Final   Metapneumovirus NOT DETECTED NOT DETECTED Final   Rhinovirus / Enterovirus NOT DETECTED NOT DETECTED Final   Influenza A NOT DETECTED NOT DETECTED Final   Influenza B NOT DETECTED NOT DETECTED Final   Parainfluenza Virus 1 NOT DETECTED NOT  DETECTED Final   Parainfluenza Virus 2 NOT DETECTED NOT DETECTED Final   Parainfluenza Virus 3 NOT  DETECTED NOT DETECTED Final   Parainfluenza Virus 4 NOT DETECTED NOT DETECTED Final   Respiratory Syncytial Virus NOT DETECTED NOT DETECTED Final   Bordetella pertussis NOT DETECTED NOT DETECTED Final   Bordetella Parapertussis NOT DETECTED NOT DETECTED Final   Chlamydophila pneumoniae NOT DETECTED NOT DETECTED Final   Mycoplasma pneumoniae NOT DETECTED NOT DETECTED Final    Comment: Performed at [facility], [place] [address], [place], [place] [address]      Radiology Studies: No results found.   Marzetta Board, MD, PhD [facility]  Between 7 am - 7 pm I am available, please contact me via Amion (for emergencies) or Securechat (non urgent messages)  Between 7 pm - 7 am I am not available, please contact night coverage MD/APP via Amion

## 2021-02-28 NOTE — Interval H&P Note (Signed)
History and Physical Interval Note:  02/28/2021 2:35 PM  [name]  has presented today for surgery, with the diagnosis of bacteremia.  The various methods of treatment have been discussed with the patient and family. After consideration of risks, benefits and other options for treatment, the patient has consented to  Procedure(s): TRANSESOPHAGEAL ECHOCARDIOGRAM (TEE) (N/A) as a surgical intervention.  The patient's history has been reviewed, patient examined, no change in status, stable for surgery.  I have reviewed the patient's chart and labs.  Questions were answered to the patient's satisfaction.     Ruben Barrera

## 2021-02-28 NOTE — Progress Notes (Signed)
PHARMACY CONSULT NOTE FOR:  OUTPATIENT  PARENTERAL ANTIBIOTIC THERAPY (OPAT)  Indication: Streptococcal MV Endocarditis Regimen: Penicillin G 24 MU continuous infusion daily End date: 04/09/21  IV antibiotic discharge orders are pended. To discharging provider:  please sign these orders via discharge navigator,  Select New Orders & click on the button choice - Manage This Unsigned Work.     Thank you for allowing pharmacy to be a part of this patient's care.  Alycia Rossetti, PharmD, BCPS Clinical Pharmacist 02/28/2021 3:57 PM   **Pharmacist phone directory can now be found on [email or web address] ([email or web address]).  Listed under [place].

## 2021-02-28 NOTE — Anesthesia Preprocedure Evaluation (Signed)
Anesthesia Evaluation  Patient identified by MRN, date of birth, ID band Patient awake    Reviewed: Allergy & Precautions, NPO status , Patient's Chart, lab work & pertinent test results, reviewed documented beta blocker date and time   History of Anesthesia Complications Negative for: history of anesthetic complications  Airway Mallampati: IV  TM Distance: >3 FB Neck ROM: Full    Dental  (+) Dental Advisory Given   Pulmonary Current Smoker and Patient abstained from smoking.,  07/08/2020 SARS coronavirus NEG   breath sounds clear to auscultation       Cardiovascular hypertension, Pt. on medications and Pt. on home beta blockers (-) angina+ CAD (mod ASCAD) and + DOE  + dysrhythmias Supra Ventricular Tachycardia and Ventricular Tachycardia + Valvular Problems/Murmurs AS  Rhythm:Regular Rate:Normal + Systolic murmurs 07/6977 ECHO: EF 30-35%, global hypokinesis, severe LVH, mild MR, mild-mod AS with mean grad 25 mmHg,    Neuro/Psych Depression Back pain: narcotics CVA, No Residual Symptoms    GI/Hepatic Neg liver ROS, GERD  Medicated and Controlled,  Endo/Other  negative endocrine ROS  Renal/GU Renal InsufficiencyRenal disease     Musculoskeletal  (+) Arthritis ,   Abdominal   Peds  Hematology negative hematology ROS (+)   Anesthesia Other Findings   Reproductive/Obstetrics                             Anesthesia Physical  Anesthesia Plan  ASA: 4  Anesthesia Plan: MAC   Post-op Pain Management:    Induction:   PONV Risk Score and Plan: 1 and Ondansetron and Propofol infusion  Airway Management Planned: Natural Airway and Nasal Cannula  Additional Equipment:   Intra-op Plan:   Post-operative Plan:   Informed Consent: I have reviewed the patients History and Physical, chart, labs and discussed the procedure including the risks, benefits and alternatives for the proposed anesthesia  with the patient or authorized representative who has indicated his/her understanding and acceptance.     Dental advisory given  Plan Discussed with: CRNA and Surgeon  Anesthesia Plan Comments:         Anesthesia Quick Evaluation

## 2021-02-28 NOTE — Procedures (Signed)
     Transesophageal Echocardiogram Note  TREV BOLEY [id number] 08/10/1932  Procedure: Transesophageal Echocardiogram Indications: Bacteremia, concern for endocarditis  Procedure Details Consent: Obtained Time Out: Verified patient identification, verified procedure, site/side was marked, verified correct patient position, special equipment/implants available, Radiology Safety Procedures followed,  medications/allergies/relevent history reviewed, required imaging and test results available.  Performed  Medications: Propofol: 227mg   Left Ventrical:  EF 30-35%  Mitral Valve: There are small, mobile, fibrinous masses noted on both the anterior and posterior mitral valve leaflets. The masses have a different echodensity than the valve itself which raises concern for endocarditis versus degenerative valve changes. There is mild functional mitral regurgitation with no evidence of significant valve destruction.  Aortic Valve: Tricuspid. Severe calcification and thickening. Moderate aortic stenosis. Trivial AI. No vegetations visualized.  Tricuspid Valve: Mild TR. No vegetations visualized  Pulmonic Valve: Normal, trivial PI. No vegetations visualized  Left Atrium/ Left atrial appendage: No LAA thrombus  Atrial septum: No PFO visualized by color doppler  Aorta: Grade III plaque   Complications: No apparent complications Patient did tolerate procedure well.  Freada Bergeron, MD 02/28/2021, 3:25 PM

## 2021-02-28 NOTE — Progress Notes (Signed)
Physical Therapy Treatment Patient Details Name: Ruben Barrera MRN: [id number] DOB: 02-Jun-1932 Today's Date: 02/28/2021   History of Present Illness 87 yo male admitted 12/5 with tachypnea, respiratory failure and CAP. Plan for TEE 02/28/21. PMhx: HFpEF, CAD, AS, R CVA, HTN, HLD, RLS    PT Comments    Patient progressing well towards PT goals. Pt anxiously awaiting TEE later today and eager to return home tonight, "if I have to walk home, I will." Improved ambulation distance with use of rollator which is similar to his 3 wheeled walker at home. Encouraged to use for all mobility until strength/mobility improve. Tolerated stair training with min guard assist for safety. Sp02 remained >92% on RA with activity. Will continue to follow and progress as tolerated for higher level balance and endurance.     Recommendations for follow up therapy are one component of a multi-disciplinary discharge planning process, led by the attending physician.  Recommendations may be updated based on patient status, additional functional criteria and insurance authorization.  Follow Up Recommendations  Home health PT     Assistance Recommended at Discharge Frequent or constant Supervision/Assistance  Equipment Recommendations  None recommended by PT    Recommendations for Other Services       Precautions / Restrictions Precautions Precautions: Fall Restrictions Weight Bearing Restrictions: No     Mobility  Bed Mobility               General bed mobility comments: Up in chair upon PT arrival.    Transfers Overall transfer level: Needs assistance Equipment used: Rollator (4 wheels) Transfers: Sit to/from Stand Sit to Stand: Supervision           General transfer comment: SUpervision for safety. Stood from [employer], from table in gym x2.    Ambulation/Gait Ambulation/Gait assistance: Min guard Gait Distance (Feet): 120 Feet (x2 bouts) Assistive device: Rollator (4 wheels) Gait  Pattern/deviations: Decreased stride length;Step-through pattern;Decreased step length - right;Decreased step length - left;Antalgic   Gait velocity interpretation: 1.31 - 2.62 ft/sec, indicative of limited community ambulator   General Gait Details: Mildly unsteady gait using rollator with complaints of bil knee discomfort but no overt LOB. Sp02 remained >92% on RA during activity. 2/4 DOE.   Stairs Stairs: Yes Stairs assistance: Min guard Stair Management: Alternating pattern;One rail Right Number of Stairs: 5 (x2 bouts) General stair comments: Cues for technique/safety. Seated rest break before and after stairs.   Wheelchair Mobility    Modified Rankin (Stroke Patients Only)       Balance Overall balance assessment: Needs assistance Sitting-balance support: Feet supported;No upper extremity supported Sitting balance-Leahy Scale: Good     Standing balance support: During functional activity Standing balance-Leahy Scale: Fair Standing balance comment: can static stand without assist, does better with UE support for walking                            Cognition Arousal/Alertness: Awake/alert Behavior During Therapy: WFL for tasks assessed/performed Overall Cognitive Status: Within Functional Limits for tasks assessed                                 General Comments: for basic mobility; decreased insight to need for assist/using rollator        Exercises      General Comments General comments (skin integrity, edema, etc.): Sp02 remained >92% on RA with activity during  session.      Pertinent Vitals/Pain Pain Assessment: Faces Faces Pain Scale: Hurts a little bit Pain Location: bil knees (hx of knee replacements_ Pain Descriptors / Indicators: Discomfort Pain Intervention(s): Monitored during session    Home Living                          Prior Function            PT Goals (current goals can now be found in the care  plan section) Progress towards PT goals: Progressing toward goals    Frequency    Min 3X/week      PT Plan Current plan remains appropriate    Co-evaluation              AM-PAC PT "6 Clicks" Mobility   Outcome Measure  Help needed turning from your back to your side while in a flat bed without using bedrails?: None Help needed moving from lying on your back to sitting on the side of a flat bed without using bedrails?: None Help needed moving to and from a bed to a chair (including a wheelchair)?: A Little Help needed standing up from a chair using your arms (e.g., wheelchair or bedside chair)?: A Little Help needed to walk in hospital room?: A Little Help needed climbing 3-5 steps with a railing? : A Little 6 Click Score: 20    End of Session Equipment Utilized During Treatment: Gait belt Activity Tolerance: Patient tolerated treatment well Patient left: in chair;with call bell/phone within reach;with chair alarm set Nurse Communication: Mobility status PT Visit Diagnosis: Unsteadiness on feet (R26.81);Other abnormalities of gait and mobility (R26.89);Repeated falls (R29.6);History of falling (Z91.81)     Time: 7628-3151 PT Time Calculation (min) (ACUTE ONLY): 17 min  Charges:  $Gait Training: 8-22 mins                     Marisa Severin, PT, DPT Acute Rehabilitation Services Pager [phone] Office [phone]      [place] 02/28/2021, 9:35 AM

## 2021-03-01 ENCOUNTER — Inpatient Hospital Stay: Payer: Self-pay

## 2021-03-01 MED ORDER — SODIUM CHLORIDE 0.9% FLUSH: 10.0000 mL | Freq: Two times a day (BID) | INTRAVENOUS | Status: DC

## 2021-03-01 MED ORDER — SODIUM CHLORIDE 0.9% FLUSH: 10.0000 mL | INTRAVENOUS | Status: DC | PRN

## 2021-03-01 NOTE — Discharge Summary (Signed)
Physician Discharge Summary  Ruben Barrera VWP:[id number] DOB: 1933-03-21 DOA: 02/24/2021  PCP: Lajean Manes, MD  Admit date: 02/24/2021 Discharge date: 03/01/2021  Admitted From: home Disposition:  home  Recommendations for Outpatient Follow-up:  Follow up with PCP in 1-2 weeks Follow up with ID in 4 weeks  Home Health: PT, RN Equipment/Devices: PICC line  Discharge Condition: stable CODE STATUS: Full code Diet recommendation: heart healthy  HPI: Per admitting MD, This is an 87 year old white male that presented to the emergency room from home.  The patient initially presented at 0500 hrs. on 02/24/2021 to the emergency room for fever of 103.5, nausea, generally not feeling well.  The patient felt he was waiting too long and left the emergency room.  Then on the evening of 1205/22 the patient became tachypneic and developed respiratory distress.  He remained febrile and had also been very nauseous through the afternoon.  He vomited multiple times with any p.o. intake.  Hospital Course / Discharge diagnoses: Principal Problem Sepsis due to Streptococcus intermedius bacteremia - patient initially on broad spectrum antibiotics for unknown source, eventually blood cultures speciated strep intermedius. ID consulted. Currently on penicillin, patient had a dental work few weeks back and it is presumed that that is the cause. He underwent a TEE which showed findings concerning for endocarditis. I have discussed case with Dr Roxan Hockey with cardiothoracic surgery, no indication for surgery currently. ID recommends 6 weeks of IV antibiotics and a PICC line was placed prior to discharge. He had [place] set up. Follow up with ID as an outpatient. Surveillance cultures negative at the time of discharge   Active Problems Acute hypoxic respiratory failure due probably to acute on chronic CHF -Patient initially with respiratory distress requiring BiPAP, CXR showed fluid overload. He was diuresed with  improvement in his respiratory status.  Remains on room air. Continue home Lasix Acute kidney injury -Baseline sCr ~0.7. Up to 1.3 on admission. Likely due to sepsis. Initially received fluids now diuresed.  Creatinine improved Acute on chronic HFrEF, CAD -received fluids on presentation and also had mild superimposed interstitial edema on the chest x-ray. Sepsis physiology resolved, and he was diuresed. Euvolemic on dc. Continue home medications. Outpatient follow up with cardiology  Essential hypertension-continue home regime Hx of depression- continue home regimen  Chronic pain 2/2 lumbar stenosis -spinal stimulator in place.  No evidence of infection RLS - Resume home Norco and gabapentin  BPH - continue home medications Hyperlipidemia-continue statin  Sepsis ruled out   Discharge Instructions  Discharge Instructions     [facility] pharmacist to adjust dose for Vancomycin, Aminoglycosides and other anti-infective therapies as requested by physician.   Complete by: As directed    [facility] to provide Cath Flo 561m   Complete by: As directed    Administer for PICC line occlusion and as ordered by physician for other access device issues.   Anaphylaxis Kit: Provided to treat any anaphylactic reaction to the medication being provided to the patient if First Dose or when requested by physician   Complete by: As directed    Epinephrine 14mml vial / amp: Administer 0.61m15m0.61ml54mubcutaneously once for moderate to severe anaphylaxis, nurse to call physician and pharmacy when reaction occurs and call 911 if needed for immediate care   Diphenhydramine 50mg72mIV vial: Administer 25-50mg 36mM PRN for first dose reaction, rash, itching, mild reaction, nurse to call physician and pharmacy when reaction occurs   Sodium Chloride 0.9% NS 500ml I5m  Administer if needed for hypovolemic blood pressure drop or as ordered by physician after call to physician with anaphylactic  reaction   Change dressing on IV access line weekly and PRN   Complete by: As directed    Flush IV access with Sodium Chloride 0.9% and Heparin 10 units/ml or 100 units/ml   Complete by: As directed    Home infusion instructions - [facility]   Complete by: As directed    Instructions: Flush IV access with Sodium Chloride 0.9% and Heparin 10units/ml or 100units/ml   Change dressing on IV access line: Weekly and PRN   Instructions Cath Flo 21m: Administer for PICC Line occlusion and as ordered by physician for other access device   [facility] pharmacist to adjust dose for: Vancomycin, Aminoglycosides and other anti-infective therapies as requested by physician   Method of administration may be changed at the discretion of home infusion pharmacist based upon assessment of the patient and/or caregiver's ability to self-administer the medication ordered   Complete by: As directed       Allergies as of 03/01/2021       Reactions   Simvastatin Other (See Comments)   Leg pains   Atorvastatin Other (See Comments)   Muscle aches   Benazepril Hcl Other (See Comments)        Medication List     STOP taking these medications    aspirin 81 MG EC tablet       TAKE these medications    Baclofen 5 MG Tabs Take 5 mg by mouth 3 (three) times daily as needed. What changed: reasons to take this   celecoxib 200 MG capsule Commonly known as: CELEBREX Take 200 mg by mouth daily.   citalopram 20 MG tablet Commonly known as: CELEXA Take 20 mg by mouth daily.   clopidogrel 75 MG tablet Commonly known as: PLAVIX Take 1 tablet (75 mg total) by mouth daily.   ezetimibe 10 MG tablet Commonly known as: ZETIA Take 1 tablet (10 mg total) by mouth every evening.   fluticasone 50 MCG/ACT nasal spray Commonly known as: FLONASE Place 1 spray into both nostrils daily as needed for allergies.   furosemide 20 MG tablet Commonly known as: LASIX Take 1 tablet (20 mg  total) by mouth daily.   gabapentin 600 MG tablet Commonly known as: NEURONTIN Take 1 tablet (600 mg total) by mouth 3 (three) times daily. What changed:  when to take this additional instructions   gabapentin 400 MG capsule Commonly known as: NEURONTIN Take 400 mg by mouth at bedtime as needed (pain). What changed: Another medication with the same name was changed. Make sure you understand how and when to take each.   HYDROcodone-acetaminophen 7.5-325 MG tablet Commonly known as: NORCO Take 1 tablet by mouth every 8 (eight) hours as needed for moderate pain.   LORazepam 1 MG tablet Commonly known as: ATIVAN Take 1 tablet (1 mg total) by mouth every 8 (eight) hours as needed for anxiety.   Magnesium 250 MG Tabs Take 250 mg by mouth daily in the afternoon.   metoprolol succinate 25 MG 24 hr tablet Commonly known as: TOPROL-XL Take 25 mg by mouth daily.   MULTIPLE VITAMIN PO Take 1 tablet by mouth daily.   omeprazole 20 MG capsule Commonly known as: PRILOSEC Take 20 mg by mouth daily.   penicillin G  IVPB Inject 24 Million Units into the vein daily. Indication:  Streptococcal MV Endocarditis First Dose: Yes Last Day  of Therapy:  04/09/21 Labs - Once weekly:  CBC/D and BMP, Labs - Every other week:  ESR and CRP Method of administration: Elastomeric (Continuous infusion) Method of administration may be changed at the discretion of home infusion pharmacist based upon assessment of the patient and/or caregiver's ability to self-administer the medication ordered.   pravastatin 20 MG tablet Commonly known as: PRAVACHOL Take 1 tablet (20 mg total) by mouth daily at 6 PM.   saccharomyces boulardii 250 MG capsule Commonly known as: FLORASTOR Take 250 mg by mouth daily in the afternoon.   senna-docusate 8.6-50 MG tablet Commonly known as: Senokot-S Take 1 tablet by mouth 2 (two) times daily. What changed:  how much to take when to take this   tamsulosin 0.4 MG Caps  capsule Commonly known as: FLOMAX Take 1 capsule (0.4 mg total) by mouth daily. What changed: when to take this   triamcinolone cream 0.1 % Commonly known as: KENALOG Apply 1 application topically daily as needed (irritation).               Discharge Care Instructions  (From admission, onward)           Start     Ordered   02/28/21 0000  Change dressing on IV access line weekly and PRN  (Home infusion instructions - [facility] )        02/28/21 1613            Follow-up Information     Lajean Manes, MD Follow up.   Specialty: Internal Medicine Contact information: [address] [place] [address] [phone]         Werner Lean, MD .   Specialty: Cardiology Contact information: [address] [place] [address] [phone]         [facility] Follow up.   Specialty: Home Health Services Contact information: [place] [address] [phone]         [facility] Follow up.   Why: IV antibiotics to be delivered to home Contact information: [address],  [place], [place] [address] [phone]        Laurice Record, MD Follow up in 4 week(s).   Specialty: Internal Medicine Why: Follow up with infectious disease MD Contact information: [address] [place] [address] [phone]                 Consultations: ID  Procedures/Studies:  DG Chest 2 View  Result Date: 02/24/2021 CLINICAL DATA:  Shortness of breath and fevers. EXAM: CHEST - 2 VIEW COMPARISON:  07/10/2020 FINDINGS: Dorsal column stimulator is noted with lead terminating in the lower thoracic canal. Mild cardiac enlargement, unchanged. Diffusely increased interstitial markings are noted bilaterally. No pleural effusion. No focal airspace opacities. There are degenerative changes involving both  glenohumeral joints. IMPRESSION: Diffusely increased interstitial markings compatible with mild CHF. Electronically Signed   By: Kerby Moors M.D.   On: 02/24/2021 06:55   DG Chest Port 1 View  Result Date: 02/25/2021 CLINICAL DATA:  Acute respiratory failure. EXAM: PORTABLE CHEST 1 VIEW COMPARISON:  02/24/2021 FINDINGS: The heart is enlarged but stable. Stable tortuosity and calcification of the thoracic aorta. Chronic appearing underlying bronchitic type lung changes with probable superimposed interstitial edema. No pleural effusions or focal infiltrates. Stable advanced degenerative changes involving both shoulders. A spinal cord stimulator is noted in  the midthoracic region. IMPRESSION: 1. Stable cardiac enlargement. 2. Chronic appearing bronchitic type lung changes with probable superimposed interstitial edema. Electronically Signed   By: Marijo Sanes M.D.   On: 02/25/2021 08:36   DG Chest Port 1 View  Result Date: 02/24/2021 CLINICAL DATA:  Fever EXAM: PORTABLE CHEST 1 VIEW COMPARISON:  02/24/2021, 07/10/2020 FINDINGS: Cardiomegaly with mild diffuse interstitial opacities suggestive of interstitial edema. No consolidation, pleural effusion or pneumothorax. Aortic atherosclerosis. Ascending thoracic stimulator leads. IMPRESSION: Cardiomegaly with mild diffuse interstitial opacity most likely representing edema Electronically Signed   By: Donavan Foil M.D.   On: 02/24/2021 19:25   ECHOCARDIOGRAM COMPLETE  Result Date: 02/25/2021    ECHOCARDIOGRAM REPORT   Patient Name:   Ruben Barrera Date of Exam: 02/25/2021 Medical Rec #:  [id number]      Height:       68.0 in Accession #:    [id number]     Weight:       184.5 lb Date of Birth:  May 20, 1932       BSA:          1.975 m Patient Age:    67 years       BP:           113/47 mmHg Patient Gender: M              HR:           77 bpm. Exam Location:  Inpatient Procedure: 2D Echo, Cardiac Doppler, Color Doppler and Intracardiac            Opacification  Agent Indications:    Acutre respiratory distress R06.03  History:        Patient has prior history of Echocardiogram examinations, most                 recent 11/21/2020. CAD, Stroke, Aortic Valve Disease; Risk                 Factors:Hypertension and Dyslipidemia.  Sonographer:    Darlina Sicilian RDCS Referring Phys: [place]  Sonographer Comments: Image acquisition challenging due to respiratory motion. IMPRESSIONS  1. Left ventricular ejection fraction, by estimation, is 30-35%. The left ventricle has moderately decreased function. The left ventricle demonstrates regional wall motion abnormalities (apical hypokinesis with contrast). The left ventricular internal cavity size was moderately dilated. Left ventricular diastolic parameters are indeterminate.  2. The aortic valve is calcified. Aortic valve regurgitation is not visualized. At least moderate aortic valve stenosis. Aortic valve area, by VTI measures 0.50 cm. Aortic valve mean gradient measures 29.0 mmHg. DVI 0.16 and LV Stroke volume 16. Suspect low flow low gradient AS.  3. Right ventricular systolic function is normal. The right ventricular size is normal. There is normal pulmonary artery systolic pressure. The estimated right ventricular systolic pressure is 99.7 mmHg.  4. Left atrial size was severely dilated.  5. The mitral valve is grossly normal. Mild to moderate mitral valve regurgitation. Moderate mitral annular calcification. Comparison(s): A prior study was performed on 11/21/20. Further LV dilation, Difficult LV comprasion (first study in series with contrast). FINDINGS  Left Ventricle: Left ventricular ejection fraction, by estimation, is 30 to 35%. The left ventricle has moderately decreased function. The left ventricle demonstrates regional wall motion abnormalities. Definity contrast agent was given IV to delineate the left ventricular endocardial borders. The left ventricular internal cavity size was moderately dilated.  There is no left ventricular hypertrophy. Left ventricular diastolic parameters are indeterminate.  LV Wall Scoring: The basal inferolateral segment, apical lateral segment, apical septal segment, apical anterior segment, and apical inferior segment are hypokinetic. Right Ventricle: The right ventricular size is normal. No increase in right ventricular wall thickness. Right ventricular systolic function is normal. There is normal pulmonary artery systolic pressure. The tricuspid regurgitant velocity is 2.25 m/s, and  with an assumed right atrial pressure of 8 mmHg, the estimated right ventricular systolic pressure is 02.7 mmHg. Left Atrium: Left atrial size was severely dilated. Right Atrium: Right atrial size was normal in size. Pericardium: There is no evidence of pericardial effusion. Mitral Valve: The mitral valve is grossly normal. Moderate mitral annular calcification. Mild to moderate mitral valve regurgitation. Tricuspid Valve: The tricuspid valve is normal in structure. Tricuspid valve regurgitation is mild. Aortic Valve: The aortic valve is calcified. Aortic valve regurgitation is not visualized. Moderate aortic stenosis is present. Aortic valve mean gradient measures 29.0 mmHg. Aortic valve peak gradient measures 43.0 mmHg. Aortic valve area, by VTI measures 0.50 cm. Pulmonic Valve: The pulmonic valve was not well visualized. Pulmonic valve regurgitation is not visualized. No evidence of pulmonic stenosis. Aorta: The aortic root is normal in size and structure. IAS/Shunts: The atrial septum is grossly normal.  LEFT VENTRICLE PLAX 2D LVIDd:         6.70 cm      Diastology LVIDs:         5.90 cm      LV e' medial:    3.16 cm/s LV PW:         1.10 cm      LV E/e' medial:  16.4 LV IVS:        1.00 cm      LV e' lateral:   4.53 cm/s LVOT diam:     2.00 cm      LV E/e' lateral: 11.5 LV SV:         31 LV SV Index:   16 LVOT Area:     3.14 cm  LV Volumes (MOD) LV vol d, MOD A2C: 163.0 ml LV vol d, MOD A4C:  232.0 ml LV vol s, MOD A2C: 104.0 ml LV vol s, MOD A4C: 149.0 ml LV SV MOD A2C:     59.0 ml LV SV MOD A4C:     232.0 ml LV SV MOD BP:      67.3 ml RIGHT VENTRICLE RV S prime:     14.10 cm/s TAPSE (M-mode): 1.2 cm LEFT ATRIUM           Index        RIGHT ATRIUM          Index LA diam:      5.20 cm 2.63 cm/m   RA Area:     9.32 cm LA Vol (A2C): 53.1 ml 26.89 ml/m  RA Volume:   14.50 ml 7.34 ml/m LA Vol (A4C): 96.1 ml 48.66 ml/m  AORTIC VALVE AV Area (Vmax):    0.45 cm AV Area (Vmean):   0.45 cm AV Area (VTI):     0.50 cm AV Vmax:           328.00 cm/s AV Vmean:          213.333 cm/s AV VTI:            0.619 m AV Peak Grad:      43.0 mmHg AV Mean Grad:      29.0 mmHg LVOT Vmax:         46.50 cm/s  LVOT Vmean:        30.500 cm/s LVOT VTI:          0.099 m LVOT/AV VTI ratio: 0.16  AORTA Ao Root diam: 3.20 cm MITRAL VALVE               TRICUSPID VALVE MV Area (PHT): 2.68 cm    TR Peak grad:   20.2 mmHg MV Decel Time: 283 msec    TR Vmax:        225.00 cm/s MV E velocity: 51.90 cm/s MV A velocity: 49.30 cm/s  SHUNTS MV E/A ratio:  1.05        Systemic VTI:  0.10 m                            Systemic Diam: 2.00 cm Rudean Haskell MD Electronically signed by Rudean Haskell MD Signature Date/Time: 02/25/2021/10:33:19 AM    Final    ECHO TEE  Result Date: 02/28/2021    TRANSESOPHOGEAL ECHO REPORT   Patient Name:   Ruben Barrera Date of Exam: 02/28/2021 Medical Rec #:  [id number]      Height:       68.0 in Accession #:    [id number]     Weight:       184.1 lb Date of Birth:  [birth year]       BSA:          1.973 m Patient Age:    61 years       BP:           158/104 mmHg Patient Gender: M              HR:           100 bpm. Exam Location:  Inpatient Procedure: 3D Echo, Cardiac Doppler, Color Doppler and Transesophageal Echo Indications:     I35.0 Nonrheumatic aortic (valve) stenosis. Bacteremia  History:         Patient has prior history of Echocardiogram examinations, most                  recent 02/25/2021.  CHF, Abnormal ECG, Aortic Valve Disease,                  Arrythmias:LBBB, Signs/Symptoms:Bacteremia; Risk                  Factors:Hypertension, Dyslipidemia and Current Smoker. Moderate                  aortic stenosis.  Sonographer:     Roseanna Rainbow RDCS Referring Phys:  [id number] [facility] Diagnosing Phys: Gwyndolyn Kaufman MD PROCEDURE: After discussion of the risks and benefits of a TEE, an informed consent was obtained from the patient. The transesophogeal probe was passed without difficulty through the esophogus of the patient. Imaged were obtained with the patient in a left lateral decubitus position. Sedation performed by different physician. The patient was monitored while under deep sedation. Anesthestetic sedation was provided intravenously by Anesthesiology: 271m of Propofol. The patient's vital signs; including heart rate, blood pressure, and oxygen saturation; remained stable throughout the procedure. The patient developed no complications during the procedure. IMPRESSIONS  1. There are small, fibrinous, independently mobile densities visualized on both the anterior and posterior mitral valve leaflets. They appear less echobright than the mitral valve leaflets which favors mitral valve vegetations (infective endocarditis) over degenerative valve changes. There is mild functional mitral regurgitation without evidence of  valve destruction.  2. Left ventricular ejection fraction, by estimation, is 30 to 35%. The left ventricle has moderately decreased function. The left ventricular internal cavity size was mildly dilated.  3. Right ventricular systolic function is normal. The right ventricular size is normal.  4. Left atrial size was severely dilated. No left atrial/left atrial appendage thrombus was detected.  5. The aortic valve is tricuspid. There is severe calcifcation of the aortic valve. There is severe thickening of the aortic valve. Aortic valve regurgitation is trivial. There is moderate aortic  stenosis with AVA 1.3cm2 by continuity, mean gradient 72mHg, Vmax 326m, DI 0.33. Gradients likely underestimated on current study due to suboptimal doppler interrogation. Conclusion(s)/Recommendation(s): Findings are concerning for vegetation/infective endocarditis as detailed above. FINDINGS  Left Ventricle: Left ventricular ejection fraction, by estimation, is 30 to 35%. The left ventricle has moderately decreased function. The left ventricular internal cavity size was mildly dilated. Right Ventricle: The right ventricular size is normal. No increase in right ventricular wall thickness. Right ventricular systolic function is normal. Left Atrium: Left atrial size was severely dilated. No left atrial/left atrial appendage thrombus was detected. Right Atrium: Right atrial size was normal in size. Pericardium: There is no evidence of pericardial effusion. Mitral Valve: There are small fibrinous, mobile densities visualized on both the anterior and posterior mitral valve leaflets. The densities are less echobright than the mitral valve leaflets favoring mitral valve vegetation over degenerative valve changes. There is mild functional mitral regurgitation with no evidence of valve destruction. The mitral valve is abnormal. Moderate mitral annular calcification. Mild mitral valve regurgitation. Tricuspid Valve: The tricuspid valve is normal in structure. Tricuspid valve regurgitation is trivial. There is no evidence of tricuspid valve vegetation. Aortic Valve: The aortic valve is tricuspid. There is severe calcifcation of the aortic valve. There is severe thickening of the aortic valve. Aortic valve regurgitation is trivial. Moderate aortic stenosis is present. Aortic valve mean gradient measures  21.0 mmHg. Aortic valve peak gradient measures 38.2 mmHg.  Pulmonic Valve: The pulmonic valve was normal in structure. Pulmonic valve regurgitation is trivial. There is no evidence of pulmonic valve vegetation. Aorta: The  aortic root is normal in size and structure. IAS/Shunts: The atrial septum is grossly normal.  LEFT VENTRICLE PLAX 2D LVOT diam:     2.50 cm LV SV:         110 LV SV Index:   56 LVOT Area:     4.91 cm  AORTIC VALVE AV Area (Vmax):    2.92 cm AV Area (Vmean):   2.97 cm AV Area (VTI):     1.72 cm AV Vmax:           309.00 cm/s AV Vmean:          214.500 cm/s AV VTI:            0.641 m AV Peak Grad:      38.2 mmHg AV Mean Grad:      21.0 mmHg LVOT Vmax:         184.00 cm/s LVOT Vmean:        130.000 cm/s LVOT VTI:          0.224 m LVOT/AV VTI ratio: 0.35  SHUNTS Systemic VTI:  0.22 m Systemic Diam: 2.50 cm HeGwyndolyn KaufmanD Electronically signed by HeGwyndolyn KaufmanD Signature Date/Time: 02/28/2021/5:36:32 PM    Final    [place] SITE RITE  Result Date: 03/01/2021 If Site Rite image not attached, placement could not be confirmed  due to current cardiac rhythm.    Subjective: - no chest pain, shortness of breath, no abdominal pain, nausea or vomiting.   Discharge Exam: BP (!) 167/85 (BP Location: Left Arm)   Pulse 90   Temp 97.6 F (36.4 C) (Oral)   Resp 18   Ht '5\' 8"'  (1.727 m)   Wt 84 kg   SpO2 99%   BMI 28.16 kg/m   General: Pt is alert, awake, not in acute distress Cardiovascular: RRR, S1/S2 +, no rubs, no gallops Respiratory: CTA bilaterally, no wheezing, no rhonchi Abdominal: Soft, NT, ND, bowel sounds + Extremities: no edema, no cyanosis    The results of significant diagnostics from this hospitalization (including imaging, microbiology, ancillary and laboratory) are listed below for reference.     Microbiology: Recent Results (from the past 240 hour(s))  Resp Panel by RT-PCR (Flu A&B, Covid) Nasopharyngeal Swab     Status: None   Collection Time: 02/24/21  6:19 AM   Specimen: Nasopharyngeal Swab; Nasopharyngeal(NP) swabs in vial transport medium  Result Value Ref Range Status   SARS Coronavirus 2 by RT PCR NEGATIVE NEGATIVE Final    Comment: (NOTE) SARS-CoV-2 target  nucleic acids are NOT DETECTED.  The SARS-CoV-2 RNA is generally detectable in upper respiratory specimens during the acute phase of infection. The lowest concentration of SARS-CoV-2 viral copies this assay can detect is 138 copies/mL. A negative result does not preclude SARS-Cov-2 infection and should not be used as the sole basis for treatment or other patient management decisions. A negative result may occur with  improper specimen collection/handling, submission of specimen other than nasopharyngeal swab, presence of viral mutation(s) within the areas targeted by this assay, and inadequate number of viral copies(<138 copies/mL). A negative result must be combined with clinical observations, patient history, and epidemiological information. The expected result is Negative.  Fact Sheet for Patients:  EntrepreneurPulse.com.au  Fact Sheet for Healthcare Providers:  IncredibleEmployment.be  This test is no t yet approved or cleared by the [place] FDA and  has been authorized for detection and/or diagnosis of SARS-CoV-2 by FDA under an Emergency Use Authorization (EUA). This EUA will remain  in effect (meaning this test can be used) for the duration of the COVID-19 declaration under Section 564(b)(1) of the Act, 21 U.S.C.section 360bbb-3(b)(1), unless the authorization is terminated  or revoked sooner.       Influenza A by PCR NEGATIVE NEGATIVE Final   Influenza B by PCR NEGATIVE NEGATIVE Final    Comment: (NOTE) The Xpert Xpress SARS-CoV-2/FLU/RSV plus assay is intended as an aid in the diagnosis of influenza from Nasopharyngeal swab specimens and should not be used as a sole basis for treatment. Nasal washings and aspirates are unacceptable for Xpert Xpress SARS-CoV-2/FLU/RSV testing.  Fact Sheet for Patients: EntrepreneurPulse.com.au  Fact Sheet for Healthcare  Providers: IncredibleEmployment.be  This test is not yet approved or cleared by the [place] FDA and has been authorized for detection and/or diagnosis of SARS-CoV-2 by FDA under an Emergency Use Authorization (EUA). This EUA will remain in effect (meaning this test can be used) for the duration of the COVID-19 declaration under Section 564(b)(1) of the Act, 21 U.S.C. section 360bbb-3(b)(1), unless the authorization is terminated or revoked.  Performed at [facility], [place] [address], [place], [place] [address]   Resp Panel by RT-PCR (Flu A&B, Covid) Nasopharyngeal Swab     Status: None   Collection Time: 02/24/21  6:44 PM   Specimen: Nasopharyngeal Swab; Nasopharyngeal(NP) swabs  in vial transport medium  Result Value Ref Range Status   SARS Coronavirus 2 by RT PCR NEGATIVE NEGATIVE Final    Comment: (NOTE) SARS-CoV-2 target nucleic acids are NOT DETECTED.  The SARS-CoV-2 RNA is generally detectable in upper respiratory specimens during the acute phase of infection. The lowest concentration of SARS-CoV-2 viral copies this assay can detect is 138 copies/mL. A negative result does not preclude SARS-Cov-2 infection and should not be used as the sole basis for treatment or other patient management decisions. A negative result may occur with  improper specimen collection/handling, submission of specimen other than nasopharyngeal swab, presence of viral mutation(s) within the areas targeted by this assay, and inadequate number of viral copies(<138 copies/mL). A negative result must be combined with clinical observations, patient history, and epidemiological information. The expected result is Negative.  Fact Sheet for Patients:  EntrepreneurPulse.com.au  Fact Sheet for Healthcare Providers:  IncredibleEmployment.be  This test is no t yet approved or cleared by the [place] FDA and  has been authorized for  detection and/or diagnosis of SARS-CoV-2 by FDA under an Emergency Use Authorization (EUA). This EUA will remain  in effect (meaning this test can be used) for the duration of the COVID-19 declaration under Section 564(b)(1) of the Act, 21 U.S.C.section 360bbb-3(b)(1), unless the authorization is terminated  or revoked sooner.       Influenza A by PCR NEGATIVE NEGATIVE Final   Influenza B by PCR NEGATIVE NEGATIVE Final    Comment: (NOTE) The Xpert Xpress SARS-CoV-2/FLU/RSV plus assay is intended as an aid in the diagnosis of influenza from Nasopharyngeal swab specimens and should not be used as a sole basis for treatment. Nasal washings and aspirates are unacceptable for Xpert Xpress SARS-CoV-2/FLU/RSV testing.  Fact Sheet for Patients: EntrepreneurPulse.com.au  Fact Sheet for Healthcare Providers: IncredibleEmployment.be  This test is not yet approved or cleared by the [place] FDA and has been authorized for detection and/or diagnosis of SARS-CoV-2 by FDA under an Emergency Use Authorization (EUA). This EUA will remain in effect (meaning this test can be used) for the duration of the COVID-19 declaration under Section 564(b)(1) of the Act, 21 U.S.C. section 360bbb-3(b)(1), unless the authorization is terminated or revoked.  Performed at [facility], [place] [address] [place], [place] [address]   Culture, blood (Routine x 2)     Status: Abnormal   Collection Time: 02/24/21  6:50 PM   Specimen: BLOOD  Result Value Ref Range Status   Specimen Description BLOOD RIGHT ANTECUBITAL  Final   Special Requests   Final    BOTTLES DRAWN AEROBIC AND ANAEROBIC Blood Culture adequate volume   Culture  Setup Time   Final    GRAM POSITIVE COCCI IN BOTH AEROBIC AND ANAEROBIC BOTTLES CRITICAL RESULT CALLED TO, READ BACK BY AND VERIFIED WITH: PHARMD E.BREWINGTON AT 1600 ON 02/25/2021 BY EP. Performed at [facility], [place] [address],  [place], [place] [address]    Culture STREPTOCOCCUS INTERMEDIUS (A)  Final   Report Status 02/27/2021 FINAL  Final   Organism ID, Bacteria STREPTOCOCCUS INTERMEDIUS  Final      Susceptibility   Streptococcus intermedius - MIC*    PENICILLIN <=0.06 SENSITIVE Sensitive     CEFTRIAXONE <=0.12 SENSITIVE Sensitive     ERYTHROMYCIN <=0.12 SENSITIVE Sensitive     LEVOFLOXACIN 0.5 SENSITIVE Sensitive     VANCOMYCIN 0.25 SENSITIVE Sensitive     * STREPTOCOCCUS INTERMEDIUS  Blood Culture ID Panel (Reflexed)  Status: Abnormal   Collection Time: 02/24/21  6:50 PM  Result Value Ref Range Status   Enterococcus faecalis NOT DETECTED NOT DETECTED Final   Enterococcus Faecium NOT DETECTED NOT DETECTED Final   Listeria monocytogenes NOT DETECTED NOT DETECTED Final   Staphylococcus species NOT DETECTED NOT DETECTED Final   Staphylococcus aureus (BCID) NOT DETECTED NOT DETECTED Final   Staphylococcus epidermidis NOT DETECTED NOT DETECTED Final   Staphylococcus lugdunensis NOT DETECTED NOT DETECTED Final   Streptococcus species DETECTED (A) NOT DETECTED Final    Comment: Not Enterococcus species, Streptococcus agalactiae, Streptococcus pyogenes, or Streptococcus pneumoniae. CRITICAL RESULT CALLED TO, READ BACK BY AND VERIFIED WITH: PHARMD EDEN BREWINGTON 02/25/2021 '@1854'  BY JW    Streptococcus agalactiae NOT DETECTED NOT DETECTED Final   Streptococcus pneumoniae NOT DETECTED NOT DETECTED Final   Streptococcus pyogenes NOT DETECTED NOT DETECTED Final   A.calcoaceticus-baumannii NOT DETECTED NOT DETECTED Final   Bacteroides fragilis NOT DETECTED NOT DETECTED Final   Enterobacterales NOT DETECTED NOT DETECTED Final   Enterobacter cloacae complex NOT DETECTED NOT DETECTED Final   Escherichia coli NOT DETECTED NOT DETECTED Final   Klebsiella aerogenes NOT DETECTED NOT DETECTED Final   Klebsiella oxytoca NOT DETECTED NOT DETECTED Final   Klebsiella pneumoniae NOT DETECTED NOT DETECTED Final   Proteus species  NOT DETECTED NOT DETECTED Final   Salmonella species NOT DETECTED NOT DETECTED Final   Serratia marcescens NOT DETECTED NOT DETECTED Final   Haemophilus influenzae NOT DETECTED NOT DETECTED Final   Neisseria meningitidis NOT DETECTED NOT DETECTED Final   Pseudomonas aeruginosa NOT DETECTED NOT DETECTED Final   Stenotrophomonas maltophilia NOT DETECTED NOT DETECTED Final   Candida albicans NOT DETECTED NOT DETECTED Final   Candida auris NOT DETECTED NOT DETECTED Final   Candida glabrata NOT DETECTED NOT DETECTED Final   Candida krusei NOT DETECTED NOT DETECTED Final   Candida parapsilosis NOT DETECTED NOT DETECTED Final   Candida tropicalis NOT DETECTED NOT DETECTED Final   Cryptococcus neoformans/gattii NOT DETECTED NOT DETECTED Final    Comment: Performed at [facility], [address], [place], [place] [address]  Culture, blood (Routine x 2)     Status: Abnormal   Collection Time: 02/24/21  6:59 PM   Specimen: BLOOD LEFT FOREARM  Result Value Ref Range Status   Specimen Description BLOOD LEFT FOREARM  Final   Special Requests   Final    BOTTLES DRAWN AEROBIC AND ANAEROBIC Blood Culture adequate volume   Culture  Setup Time   Final    GRAM POSITIVE COCCI IN BOTH AEROBIC AND ANAEROBIC BOTTLES CRITICAL VALUE NOTED.  VALUE IS CONSISTENT WITH PREVIOUSLY REPORTED AND CALLED VALUE.    Culture (A)  Final    STREPTOCOCCUS INTERMEDIUS SUSCEPTIBILITIES PERFORMED ON PREVIOUS CULTURE WITHIN THE LAST 5 DAYS. Performed at [facility], [place] [address], [place], [place] [address]    Report Status 02/27/2021 FINAL  Final  MRSA Next Gen by PCR, Nasal     Status: None   Collection Time: 02/25/21  3:16 AM   Specimen: Nasal Mucosa; Nasal Swab  Result Value Ref Range Status   MRSA by PCR Next Gen NOT DETECTED NOT DETECTED Final    Comment: (NOTE) The GeneXpert MRSA Assay (FDA approved for NASAL specimens only), is one component of a comprehensive MRSA colonization  surveillance program. It is not intended to diagnose MRSA infection nor to guide or monitor treatment for MRSA infections. Test performance is not FDA approved in patients less than  78 years old. Performed at [facility], [place] [address] [place], [place] [address]   Respiratory (~20 pathogens) panel by PCR     Status: None   Collection Time: 02/25/21 10:30 AM   Specimen: Nasopharyngeal Swab; Respiratory  Result Value Ref Range Status   Adenovirus NOT DETECTED NOT DETECTED Final   Coronavirus 229E NOT DETECTED NOT DETECTED Final    Comment: (NOTE) The Coronavirus on the Respiratory Panel, DOES NOT test for the novel  Coronavirus (2019 nCoV)    Coronavirus HKU1 NOT DETECTED NOT DETECTED Final   Coronavirus NL63 NOT DETECTED NOT DETECTED Final   Coronavirus OC43 NOT DETECTED NOT DETECTED Final   Metapneumovirus NOT DETECTED NOT DETECTED Final   Rhinovirus / Enterovirus NOT DETECTED NOT DETECTED Final   Influenza A NOT DETECTED NOT DETECTED Final   Influenza B NOT DETECTED NOT DETECTED Final   Parainfluenza Virus 1 NOT DETECTED NOT DETECTED Final   Parainfluenza Virus 2 NOT DETECTED NOT DETECTED Final   Parainfluenza Virus 3 NOT DETECTED NOT DETECTED Final   Parainfluenza Virus 4 NOT DETECTED NOT DETECTED Final   Respiratory Syncytial Virus NOT DETECTED NOT DETECTED Final   Bordetella pertussis NOT DETECTED NOT DETECTED Final   Bordetella Parapertussis NOT DETECTED NOT DETECTED Final   Chlamydophila pneumoniae NOT DETECTED NOT DETECTED Final   Mycoplasma pneumoniae NOT DETECTED NOT DETECTED Final    Comment: Performed at [facility], [place] [address], [place], [place] [address]  Culture, blood (routine x 2)     Status: None (Preliminary result)   Collection Time: 02/27/21  8:57 AM   Specimen: BLOOD  Result Value Ref Range Status   Specimen Description BLOOD LEFT ANTECUBITAL  Final   Special Requests   Final    BOTTLES DRAWN AEROBIC AND ANAEROBIC Blood Culture adequate  volume   Culture   Final    NO GROWTH 2 DAYS Performed at [facility], [place] [address], [place], [place] [address]    Report Status PENDING  Incomplete  Culture, blood (routine x 2)     Status: None (Preliminary result)   Collection Time: 02/27/21  9:09 AM   Specimen: BLOOD LEFT HAND  Result Value Ref Range Status   Specimen Description BLOOD LEFT HAND  Final   Special Requests   Final    BOTTLES DRAWN AEROBIC AND ANAEROBIC Blood Culture adequate volume   Culture   Final    NO GROWTH 2 DAYS Performed at [facility], [place] [address] [place], [place] [address]    Report Status PENDING  Incomplete     Labs: Basic Metabolic Panel: Recent Labs  Lab 02/25/21 0137 02/25/21 1349 02/25/21 2237 02/26/21 0325 02/27/21 0240  NA 134* 135 134* 135 134*  K 3.7 4.0 4.2 4.0 3.7  CL 101 99 100 100 100  CO2 21* '26 26 26 24  ' GLUCOSE 142* 115* 131* 115* 113*  BUN 24* 33* 33* 28* 25*  CREATININE 1.32* 1.61* 1.43* 1.36* 1.23  CALCIUM 8.3* 8.0* 8.1* 8.1* 8.4*  MG 2.1  --  2.2 2.3  --   PHOS 3.3  --   --  3.6  --    Liver Function Tests: Recent Labs  Lab 02/24/21 1850  AST 28  ALT 21  ALKPHOS 71  BILITOT 1.5*  PROT 7.8  ALBUMIN 4.1   CBC: Recent Labs  Lab 02/24/21 1850 02/24/21 1921 02/24/21 2012 02/25/21 0022 02/25/21 0137 02/26/21 0325 02/27/21 0240  WBC 16.2*  --   --  24.2* 26.2* 19.0* 11.6*  NEUTROABS 14.4*  --   --   --   --   --   --   HGB 16.4   < > 12.2* 14.3 14.9 14.0 13.7  HCT 50.1   < > 36.0* 43.9 44.1 42.0 40.8  MCV 96.7  --   --  98.2 95.7 97.0 96.0  PLT 223  --   --  184 190 179 193   < > = values in this interval not displayed.   CBG: Recent Labs  Lab 02/25/21 0105 02/25/21 0337 02/25/21 0731  GLUCAP 143* 144* 118*   Hgb A1c No results for input(s): HGBA1C in the last 72 hours. Lipid Profile No results for input(s): CHOL, HDL, LDLCALC, TRIG, CHOLHDL, LDLDIRECT in the last 72 hours. Thyroid function studies No results for input(s):  TSH, T4TOTAL, T3FREE, THYROIDAB in the last 72 hours.  Invalid input(s): FREET3 Urinalysis    Component Value Date/Time   COLORURINE YELLOW 02/24/2021 0620   APPEARANCEUR CLEAR 02/24/2021 0620   LABSPEC 1.011 02/24/2021 0620   PHURINE 7.0 02/24/2021 0620   GLUCOSEU NEGATIVE 02/24/2021 0620   HGBUR SMALL (A) 02/24/2021 0620   BILIRUBINUR NEGATIVE 02/24/2021 0620   KETONESUR NEGATIVE 02/24/2021 0620   PROTEINUR NEGATIVE 02/24/2021 0620   NITRITE NEGATIVE 02/24/2021 0620   LEUKOCYTESUR NEGATIVE 02/24/2021 0620    FURTHER DISCHARGE INSTRUCTIONS:   Get Medicines reviewed and adjusted: Please take all your medications with you for your next visit with your Primary MD   Laboratory/radiological data: Please request your Primary MD to go over all hospital tests and procedure/radiological results at the follow up, please ask your Primary MD to get all Hospital records sent to his/her office.   In some cases, they will be blood work, cultures and biopsy results pending at the time of your discharge. Please request that your primary care M.D. goes through all the records of your hospital data and follows up on these results.   Also Note the following: If you experience worsening of your admission symptoms, develop shortness of breath, life threatening emergency, suicidal or homicidal thoughts you must seek medical attention immediately by calling 911 or calling your MD immediately  if symptoms less severe.   You must read complete instructions/literature along with all the possible adverse reactions/side effects for all the Medicines you take and that have been prescribed to you. Take any new Medicines after you have completely understood and accpet all the possible adverse reactions/side effects.    Do not drive when taking Pain medications or sleeping medications (Benzodaizepines)   Do not take more than prescribed Pain, Sleep and Anxiety Medications. It is not advisable to combine  anxiety,sleep and pain medications without talking with your primary care practitioner   Special Instructions: If you have smoked or chewed Tobacco  in the last 2 yrs please stop smoking, stop any regular Alcohol  and or any Recreational drug use.   Wear Seat belts while driving.   Please note: You were cared for by a hospitalist during your hospital stay. Once you are discharged, your primary care physician will handle any further medical issues. Please note that NO REFILLS for any discharge medications will be authorized once you are discharged, as it is imperative that you return to your primary care physician (or establish a relationship with a primary care physician if you do not have one) for your post hospital discharge needs so that they can reassess your need for medications and monitor your lab  values.  Time coordinating discharge: 40 minutes  SIGNED:  Marzetta Board, MD, PhD 03/01/2021, 5:13 PM

## 2021-03-01 NOTE — Progress Notes (Signed)
Peripherally Inserted Central Catheter Placement  The IV Nurse has discussed with the patient and/or persons authorized to consent for the patient, the purpose of this procedure and the potential benefits and risks involved with this procedure.  The benefits include less needle sticks, lab draws from the catheter, and the patient may be discharged home with the catheter. Risks include, but not limited to, infection, bleeding, blood clot (thrombus formation), and puncture of an artery; nerve damage and irregular heartbeat and possibility to perform a PICC exchange if needed/ordered by physician.  Alternatives to this procedure were also discussed.  Bard Power PICC patient education guide, fact sheet on infection prevention and patient information card has been provided to patient /or left at bedside.    PICC Placement Documentation  PICC Single Lumen 61/68/37 Right Basilic 38 cm 2 cm (Active)  Indication for Insertion or Continuance of Line Home intravenous therapies (PICC only) 03/01/21 1800  Exposed Catheter (cm) 2 cm 03/01/21 1800  Site Assessment Clean;Dry;Intact 03/01/21 1800  Line Status Flushed;Saline locked;Blood return noted 03/01/21 1800  Dressing Type Transparent;Securing device 03/01/21 1800  Dressing Status Clean;Dry;Intact 03/01/21 1800  Antimicrobial disc in place? Yes 03/01/21 1800  Safety Lock Not Applicable 29/02/11 1552  Line Care Connections checked and tightened 03/01/21 1800  Line Adjustment (NICU/IV Team Only) No 03/01/21 1800  Dressing Intervention New dressing;Antimicrobial disc changed;Securement device changed 03/01/21 1800  Dressing Change Due 03/08/21 03/01/21 1800       [place] 03/01/2021, 6:06 PM

## 2021-03-01 NOTE — Progress Notes (Signed)
Pt taught discharge education, belongings collected and PICC Line in place

## 2021-03-01 NOTE — TOC Progression Note (Signed)
Transition of Care [facility]) - Progression Note    Patient Details  Name: Ruben Barrera MRN: [id number] Date of Birth: 1932/04/24  Transition of [facility]) CM/SW Contact  Bartholomew Crews, RN Phone Number: [phone] 03/01/2021, 9:03 AM  Clinical Narrative:     Follow up to previous CM transition planning. Confirmed with [facility] that they are following for home antibiotic infusion. Pam with [facility] has already spoken with wife and antibiotics are pending delivery once discharge date confirmed. Confirmed with [facility] following for [facility] RN, PT - HH and face to face orders in place. Notified by MD of possible readiness to transition home pending negative blood cultures and PICC placement. TOC following for transition needs.   Expected Discharge Plan: Home/Self Care    Expected Discharge Plan and Services Expected Discharge Plan: Home/Self Care   Discharge Planning Services: CM Consult   Living arrangements for the past 2 months: Single Family Home                 DME Arranged: Other see comment (? lt abx need / [facility]) DME Agency: Other - Comment Date DME Agency Contacted: 02/28/21 Time DME Agency Contacted: [phone] Representative spoke with at DME Agency: Pam HH Arranged: RN, OT, PT [place] Agency: [place] Date [place]: 02/28/21 Time [place]: 1115 Representative spoke with at [place]: [place] ([place]) Interventions    Readmission Risk Interventions No flowsheet data found.

## 2021-03-01 NOTE — Plan of Care (Signed)

## 2021-03-01 NOTE — TOC Transition Note (Signed)
Transition of Care [facility]) - CM/SW Discharge Note   Patient Details  Name: ABAS LEICHT MRN: [id number] Date of Birth: 10-10-1932  Transition [facility]) CM/SW Contact:  Bartholomew Crews, RN Phone Number: [phone] 03/01/2021, 4:33 PM   Clinical Narrative:     Patient to transition home pending PICC placement. Spoke with Pam with [facility]. Patient's wife has been well trained and can connect antibiotic to PICC line when patient gets home.         Patient Goals and CMS Choice     Choice offered to / list presented to : Spouse  Discharge Placement                       Discharge Plan and Services   Discharge Planning Services: CM Consult            DME Arranged: Other see comment (? lt abx need / [facility]) DME Agency: Other - Comment Date DME Agency Contacted: 02/28/21 Time DME Agency Contacted: [phone] Representative spoke with at DME Agency: Pam HH Arranged: RN, OT, PT [place] Agency: [place] Date [place]: 02/28/21 Time [place]: 1115 Representative spoke with at [place]: [place] ([place]) Interventions     Readmission Risk Interventions No flowsheet data found.

## 2021-03-03 ENCOUNTER — Encounter (HOSPITAL_COMMUNITY): Payer: Self-pay | Admitting: Cardiology

## 2021-03-03 ENCOUNTER — Telehealth: Payer: Self-pay | Admitting: Internal Medicine

## 2021-03-03 DIAGNOSIS — G8929 Other chronic pain: Secondary | ICD-10-CM | POA: Diagnosis not present

## 2021-03-03 DIAGNOSIS — Z452 Encounter for adjustment and management of vascular access device: Secondary | ICD-10-CM | POA: Diagnosis not present

## 2021-03-03 DIAGNOSIS — G2581 Restless legs syndrome: Secondary | ICD-10-CM | POA: Diagnosis not present

## 2021-03-03 DIAGNOSIS — Z792 Long term (current) use of antibiotics: Secondary | ICD-10-CM | POA: Diagnosis not present

## 2021-03-03 DIAGNOSIS — I700 Atherosclerosis of aorta: Secondary | ICD-10-CM | POA: Diagnosis not present

## 2021-03-03 DIAGNOSIS — N179 Acute kidney failure, unspecified: Secondary | ICD-10-CM | POA: Diagnosis not present

## 2021-03-03 DIAGNOSIS — J9601 Acute respiratory failure with hypoxia: Secondary | ICD-10-CM | POA: Diagnosis not present

## 2021-03-03 DIAGNOSIS — M19011 Primary osteoarthritis, right shoulder: Secondary | ICD-10-CM | POA: Diagnosis not present

## 2021-03-03 DIAGNOSIS — I088 Other rheumatic multiple valve diseases: Secondary | ICD-10-CM | POA: Diagnosis not present

## 2021-03-03 DIAGNOSIS — I447 Left bundle-branch block, unspecified: Secondary | ICD-10-CM | POA: Diagnosis not present

## 2021-03-03 DIAGNOSIS — I110 Hypertensive heart disease with heart failure: Secondary | ICD-10-CM | POA: Diagnosis not present

## 2021-03-03 DIAGNOSIS — F32A Depression, unspecified: Secondary | ICD-10-CM | POA: Diagnosis not present

## 2021-03-03 DIAGNOSIS — Z9682 Presence of neurostimulator: Secondary | ICD-10-CM | POA: Diagnosis not present

## 2021-03-03 DIAGNOSIS — Z5181 Encounter for therapeutic drug level monitoring: Secondary | ICD-10-CM | POA: Diagnosis not present

## 2021-03-03 DIAGNOSIS — I5043 Acute on chronic combined systolic (congestive) and diastolic (congestive) heart failure: Secondary | ICD-10-CM | POA: Diagnosis not present

## 2021-03-03 DIAGNOSIS — I708 Atherosclerosis of other arteries: Secondary | ICD-10-CM | POA: Diagnosis not present

## 2021-03-03 DIAGNOSIS — Z7902 Long term (current) use of antithrombotics/antiplatelets: Secondary | ICD-10-CM | POA: Diagnosis not present

## 2021-03-03 DIAGNOSIS — N400 Enlarged prostate without lower urinary tract symptoms: Secondary | ICD-10-CM | POA: Diagnosis not present

## 2021-03-03 DIAGNOSIS — Z9181 History of falling: Secondary | ICD-10-CM | POA: Diagnosis not present

## 2021-03-03 DIAGNOSIS — E782 Mixed hyperlipidemia: Secondary | ICD-10-CM | POA: Diagnosis not present

## 2021-03-03 DIAGNOSIS — M48061 Spinal stenosis, lumbar region without neurogenic claudication: Secondary | ICD-10-CM | POA: Diagnosis not present

## 2021-03-03 DIAGNOSIS — I2510 Atherosclerotic heart disease of native coronary artery without angina pectoris: Secondary | ICD-10-CM | POA: Diagnosis not present

## 2021-03-03 DIAGNOSIS — M19012 Primary osteoarthritis, left shoulder: Secondary | ICD-10-CM | POA: Diagnosis not present

## 2021-03-03 NOTE — Telephone Encounter (Signed)
Ruben Barrera called to say patient HR is between 48-52. he just got out the hopsital on the 10th. Looking to get the patient seen but nothing is available. Please advise

## 2021-03-03 NOTE — Telephone Encounter (Signed)
Spoke with [employer] nurse with [facility].  She reports that pt HR ranges 48-52.  Pt noted with increased sleepiness d/t infectious process.  Unable to tell if pt is symptomatic from HR.  Last EKG in Epic is from 02/25/21 which shows frequent bigeminy.  MD would like pt to be seen 03/04/21 on his DOD slot.  Spouse reports unable to come in for OV on 03/04/21.  Pt scheduled for 03/07/21 with Dr. Acie Fredrickson spouse made aware this will be a DOD OV.

## 2021-03-03 NOTE — Telephone Encounter (Signed)
Left a message requesting that Ruben Barrera call our office.

## 2021-03-04 ENCOUNTER — Ambulatory Visit: Payer: PPO | Admitting: Internal Medicine

## 2021-03-04 ENCOUNTER — Telehealth: Payer: Self-pay

## 2021-03-04 LAB — CULTURE, BLOOD (ROUTINE X 2)
Culture: NO GROWTH
Culture: NO GROWTH
Special Requests: ADEQUATE
Special Requests: ADEQUATE

## 2021-03-04 NOTE — Anesthesia Postprocedure Evaluation (Signed)
Anesthesia Post Note  Patient: Ruben Barrera  Procedure(s) Performed: TRANSESOPHAGEAL ECHOCARDIOGRAM (TEE)     Patient location during evaluation: PACU Anesthesia Type: MAC Level of consciousness: awake and alert Pain management: pain level controlled Vital Signs Assessment: post-procedure vital signs reviewed and stable Respiratory status: spontaneous breathing, nonlabored ventilation, respiratory function stable and patient connected to nasal cannula oxygen Cardiovascular status: stable and blood pressure returned to baseline Postop Assessment: no apparent nausea or vomiting Anesthetic complications: no   No notable events documented.  Last Vitals:  Vitals:   03/01/21 1213 03/01/21 1553  BP: (!) 132/53 (!) 167/85  Pulse: 78 90  Resp: 20 18  Temp: 36.4 C 36.4 C  SpO2: 98% 99%    Last Pain:  Vitals:   03/01/21 1553  TempSrc: Oral  PainSc:                  Tiajuana Amass

## 2021-03-04 NOTE — Telephone Encounter (Signed)
Beth nurse with [facility] called in to report pt wife has called numerous times.  Wife reports that pt is SOB and has 6 pound weight gain since Sunday BP 149/106.  I reviewed this information with Dr. Gasper Sells who request that pt take an additional dose of furosemide (total of 40 mg PO QD) daily until Friday's DOD appointment.  Eustaquio Maize will call wife and give wife this information.

## 2021-03-05 DIAGNOSIS — R7881 Bacteremia: Secondary | ICD-10-CM | POA: Diagnosis not present

## 2021-03-05 DIAGNOSIS — A419 Sepsis, unspecified organism: Secondary | ICD-10-CM | POA: Diagnosis not present

## 2021-03-06 DIAGNOSIS — F330 Major depressive disorder, recurrent, mild: Secondary | ICD-10-CM | POA: Diagnosis not present

## 2021-03-06 DIAGNOSIS — E7800 Pure hypercholesterolemia, unspecified: Secondary | ICD-10-CM | POA: Diagnosis not present

## 2021-03-06 DIAGNOSIS — F322 Major depressive disorder, single episode, severe without psychotic features: Secondary | ICD-10-CM | POA: Diagnosis not present

## 2021-03-06 DIAGNOSIS — I5022 Chronic systolic (congestive) heart failure: Secondary | ICD-10-CM | POA: Diagnosis not present

## 2021-03-06 DIAGNOSIS — I10 Essential (primary) hypertension: Secondary | ICD-10-CM | POA: Diagnosis not present

## 2021-03-07 ENCOUNTER — Encounter: Payer: Self-pay | Admitting: Cardiovascular Disease

## 2021-03-07 ENCOUNTER — Ambulatory Visit: Payer: PPO | Admitting: Cardiovascular Disease

## 2021-03-07 ENCOUNTER — Other Ambulatory Visit: Payer: Self-pay

## 2021-03-07 VITALS — BP 138/72 | HR 86 | Ht 68.0 in | Wt 188.0 lb

## 2021-03-07 DIAGNOSIS — I471 Supraventricular tachycardia: Secondary | ICD-10-CM

## 2021-03-07 NOTE — Telephone Encounter (Signed)
FYI--Patient's wife is following up. She states yesterday the patient took an extra dose of Furosemide 40 MG as advised, but this morning he would not take an extra dose.

## 2021-03-07 NOTE — Patient Instructions (Signed)
Medication Instructions:  Your physician recommends that you continue on your current medications as directed. Please refer to the Current Medication list given to you today.  *If you need a refill on your cardiac medications before your next appointment, please call your pharmacy*  Follow-Up: At [facility], you and your health needs are our priority.  As part of our continuing mission to provide you with exceptional heart care, we have created designated Provider Care Teams.  These Care Teams include your primary Cardiologist (physician) and Advanced Practice Providers (APPs -  Physician Assistants and Nurse Practitioners) who all work together to provide you with the care you need, when you need it.  Your next appointment:   4-6 week(s)  The format for your next appointment:   In Person  Provider:   Werner Lean, MD

## 2021-03-07 NOTE — Telephone Encounter (Signed)
Called pt to follow up on extra dose of lasix ordered.  Pt reports medication was taken.  Pt wants to know if office could prescribe a sleep aid.  RN advised pt to follow up with PCP office. No further questions or concerns.

## 2021-03-07 NOTE — Progress Notes (Signed)
**Note Ruben-Identified via Obfuscation** Cardiology Office Note:    Date:  03/07/2021   ID:  Ruben Barrera, DOB [birth year], MRN [id number]  PCP:  Lajean Manes, MD   [facility] Providers Cardiologist:  Werner Lean, MD {  Referring MD: Lajean Manes, MD   Chief Complaint  Patient presents with   Endocarditis     History of Present Illness:    Seen with wife , Ruben Barrera is a 87 y.o. male with a hx of coronary artery disease, hyperlipidemia, hypertension.  He was recently admitted to the hospital with a fever of 103.5.  Blood cultures grew a Streptococcus intermedius.  Transesophageal echo revealed multiple vegetations on his mitral valve.  He was seen by Dr. Johney Frame and also by Dr. Roxan Hockey for surgical consultation.  Dr. Roxan Hockey recommended 6 weeks of IV antibiotics.    Wife is giving  IV Abx via PICC line  Fever is better  Main complaint is about a pulled R groin pull that occurred during a PT session.  He is tolerating the IV ABX well  Feeling better   Past Medical History:  Diagnosis Date   Arthritis    Coronary artery disease    Depression    Dysrhythmia    "extra heart beat"   GERD (gastroesophageal reflux disease)    History of hiatal hernia    Hypercholesteremia    Hypertension    Lumbar spinal stenosis    RLS (restless legs syndrome)    takes ativan as needed   Stroke ([place]) 01/2020   minor - no deficits    Past Surgical History:  Procedure Laterality Date   BACK SURGERY     COLONOSCOPY     EYE SURGERY Bilateral    cataracts removed    LUMBAR LAMINECTOMY/DECOMPRESSION MICRODISCECTOMY N/A 08/28/2015   Procedure: Lumbar Four-Five decompressive lumbar laminectomy;  Surgeon: Jovita Gamma, MD;  Location: [place] [facility];  Service: Neurosurgery;  Laterality: N/A;   REPLACEMENT TOTAL KNEE BILATERAL Bilateral 11/05/2003   RIGHT/LEFT HEART CATH AND CORONARY ANGIOGRAPHY N/A 03/28/2020   Procedure: RIGHT/LEFT HEART CATH AND CORONARY ANGIOGRAPHY;  Surgeon: Burnell Blanks, MD;  Location: [place] [facility];  Service: Cardiovascular;  Laterality: N/A;   TEE WITHOUT CARDIOVERSION N/A 02/28/2021   Procedure: TRANSESOPHAGEAL ECHOCARDIOGRAM (TEE);  Surgeon: Freada Bergeron, MD;  Location: [place];  Service: Cardiovascular;  Laterality: N/A;   THORACIC DISCECTOMY N/A 07/10/2020   Procedure: Spinal cord stimulator via  - Thoracic Eight-Thoracic Nine, Thoracic Nine-Thoracic Ten Laminectomy;  Surgeon: Eustace Moore, MD;  Location: [place];  Service: Neurosurgery;  Laterality: N/A;  3C   TOE AMPUTATION Right 2010   second toe   TONSILLECTOMY      Current Medications: Current Meds  Medication Sig   Baclofen 5 MG TABS Take 5 mg by mouth 3 (three) times daily as needed.   celecoxib (CELEBREX) 200 MG capsule Take 200 mg by mouth daily.   citalopram (CELEXA) 20 MG tablet Take 20 mg by mouth daily.   clopidogrel (PLAVIX) 75 MG tablet Take 1 tablet (75 mg total) by mouth daily.   ezetimibe (ZETIA) 10 MG tablet Take 1 tablet (10 mg total) by mouth every evening.   fluticasone (FLONASE) 50 MCG/ACT nasal spray Place 1 spray into both nostrils daily as needed for allergies.    furosemide (LASIX) 20 MG tablet Take 1 tablet (20 mg total) by mouth daily.   gabapentin (NEURONTIN) 400 MG capsule Take 400 mg by mouth at bedtime as needed (  pain).   gabapentin (NEURONTIN) 600 MG tablet Take 1 tablet (600 mg total) by mouth 3 (three) times daily.   HYDROcodone-acetaminophen (NORCO) 7.5-325 MG tablet Take 1 tablet by mouth every 8 (eight) hours as needed for moderate pain.   LORazepam (ATIVAN) 1 MG tablet Take 1 tablet (1 mg total) by mouth every 8 (eight) hours as needed for anxiety.   Magnesium 250 MG TABS Take 250 mg by mouth daily in the afternoon.   metoprolol succinate (TOPROL-XL) 25 MG 24 hr tablet Take 25 mg by mouth daily.   MULTIPLE VITAMIN PO Take 1 tablet by mouth daily.    omeprazole (PRILOSEC) 20 MG capsule Take 20 mg by mouth daily.   penicillin G  IVPB Inject 24 Million Units into the vein daily. Indication:  Streptococcal MV Endocarditis First Dose: Yes Last Day of Therapy:  04/09/21 Labs - Once weekly:  CBC/D and BMP, Labs - Every other week:  ESR and CRP Method of administration: Elastomeric (Continuous infusion) Method of administration may be changed at the discretion of home infusion pharmacist based upon assessment of the patient and/or caregiver's ability to self-administer the medication ordered.   pravastatin (PRAVACHOL) 20 MG tablet Take 1 tablet (20 mg total) by mouth daily at 6 PM.   saccharomyces boulardii (FLORASTOR) 250 MG capsule Take 250 mg by mouth daily in the afternoon.   senna-docusate (SENOKOT-S) 8.6-50 MG tablet Take 1 tablet by mouth 2 (two) times daily. (Patient taking differently: Take 2 tablets by mouth at bedtime.)   tamsulosin (FLOMAX) 0.4 MG CAPS capsule Take 1 capsule (0.4 mg total) by mouth daily. (Patient taking differently: Take 0.4 mg by mouth daily after supper.)   triamcinolone (KENALOG) 0.1 % Apply 1 application topically daily as needed (irritation).     Allergies:   Simvastatin, Atorvastatin, and Benazepril hcl   Social History   Socioeconomic History   Marital status: Married    Spouse name: Not on file   Number of children: Not on file   Years of education: Not on file   Highest education level: Not on file  Occupational History   Not on file  Tobacco Use   Smoking status: Some Days    Types: Cigars   Smokeless tobacco: Never   Tobacco comments:    quit 1972- currently 1 cigar per week  Vaping Use   Vaping Use: Never used  Substance and Sexual Activity   Alcohol use: Yes    Comment: occasionally beer/wine or scotch   Drug use: No    Comment: 1 cigar per week   Sexual activity: Not Currently  Other Topics Concern   Not on file  Social History Narrative   Right handed   Lives with wife one story home   Social Determinants of Health   Financial Resource Strain: Not on file   Food Insecurity: Not on file  Transportation Needs: Not on file  Physical Activity: Not on file  Stress: Not on file  Social Connections: Not on file     Family History: The patient's family history includes Hypertension in an other family member; Stroke in his mother.  ROS:   Please see the history of present illness.     All other systems reviewed and are negative.  EKGs/Labs/Other Studies Reviewed:    The following studies were reviewed today:   EKG:  Dec. 16, 2022:   NSR at 86.  No ST or T wave changes.   Recent Labs: 03/19/2020: NT-Pro BNP 1,910 02/24/2021: ALT  21; B Natriuretic Peptide 630.4 02/26/2021: Magnesium 2.3 02/27/2021: BUN 25; Creatinine, Ser 1.23; Hemoglobin 13.7; Platelets 193; Potassium 3.7; Sodium 134  Recent Lipid Panel    Component Value Date/Time   CHOL 204 (H) 02/21/2020 0253   TRIG 199 (H) 02/21/2020 0253   HDL 42 02/21/2020 0253   CHOLHDL 4.9 02/21/2020 0253   VLDL 40 02/21/2020 0253   LDLCALC 122 (H) 02/21/2020 0253     Risk Assessment/Calculations:          Physical Exam:    VS:  BP 138/72 (BP Location: Left Arm, Patient Position: Sitting, Cuff Size: Normal)    Pulse 86    Ht _0  (1.727 m)    Wt 188 lb (85.3 kg)    SpO2 95%    BMI 28.59 kg/m     Wt Readings from Last 3 Encounters:  03/07/21 188 lb (85.3 kg)  03/01/21 185 lb 3 oz (84 kg)  12/06/20 182 lb (82.6 kg)     GEN:  Well nourished, well developed in no acute distress HEENT: Normal NECK: No JVD; No carotid bruits LYMPHATICS: No lymphadenopathy CARDIAC: RRR, no murmurs, rubs, gallops RESPIRATORY:  Clear to auscultation without rales, wheezing or rhonchi  ABDOMEN: Soft, non-tender, non-distended MUSCULOSKELETAL:  No edema; No deformity  SKIN: Warm and dry NEUROLOGIC:  Alert and oriented x 3 PSYCHIATRIC:  Normal affect   ASSESSMENT:    No diagnosis found. PLAN:    In order of problems listed above:  Bacterial endocarditis: Mr. Thammavong was admitted to the  hospital a week or so ago with fever of 103.5.  He grew out a Streptococcus species in his blood.  TEE revealed multiple vegetations on his mitral valve.  He was discharged with a PICC line with IV antibiotics for 6 weeks.  He will be reassessed in 6 weeks.  For now he seems to be doing well.  He is tolerating the antibiotics.  His wife is able to treat out his antibiotics at home.  We will have him return to see Dr. Andria Frames Shaker in the next 4 to 6 weeks.  He will likely need a repeat transesophageal echo at that time.  He will give [place] a call if he has any recurrent symptoms.           Medication Adjustments/Labs and Tests Ordered: Current medicines are reviewed at length with the patient today.  Concerns regarding medicines are outlined above.  No orders of the defined types were placed in this encounter.  No orders of the defined types were placed in this encounter.   There are no Patient Instructions on file for this visit.   Signed, Mertie Moores, MD  03/07/2021 1:43 PM    [facility]

## 2021-03-10 DIAGNOSIS — Z5181 Encounter for therapeutic drug level monitoring: Secondary | ICD-10-CM | POA: Diagnosis not present

## 2021-03-10 DIAGNOSIS — Z792 Long term (current) use of antibiotics: Secondary | ICD-10-CM | POA: Diagnosis not present

## 2021-03-10 NOTE — Telephone Encounter (Signed)
Beth with [facility] calling to clarify if the patient is to continue on the extra dose of furosemide. She states the patient's wife said he lost about 5 lbs and has no more swelling in his legs. She says his wife told her the patient continued to extra dose of furosemide until Saturday, but yesterday he went back to one 20 mg tablet daily. Phone: [phone]

## 2021-03-10 NOTE — Telephone Encounter (Signed)
Called Beth with [facility], she reports pt weight is down 5 lbs, no swelling to legs and SOB has decreased.  I advised her that pt can return to furosemide 20 mg PO QD.  I also called spoke with wife and advised her of recommendation.  Wife expresses pt breathing is back at baseline for him.  Verbalizes understanding that pt is to return to furosemide 20 mg PO QD all questions answered.  Advised pt to call the office if SOB returns.

## 2021-03-12 DIAGNOSIS — R7881 Bacteremia: Secondary | ICD-10-CM | POA: Diagnosis not present

## 2021-03-12 DIAGNOSIS — A419 Sepsis, unspecified organism: Secondary | ICD-10-CM | POA: Diagnosis not present

## 2021-03-18 DIAGNOSIS — I088 Other rheumatic multiple valve diseases: Secondary | ICD-10-CM | POA: Diagnosis not present

## 2021-03-18 DIAGNOSIS — N400 Enlarged prostate without lower urinary tract symptoms: Secondary | ICD-10-CM | POA: Diagnosis not present

## 2021-03-18 DIAGNOSIS — I447 Left bundle-branch block, unspecified: Secondary | ICD-10-CM | POA: Diagnosis not present

## 2021-03-18 DIAGNOSIS — I700 Atherosclerosis of aorta: Secondary | ICD-10-CM | POA: Diagnosis not present

## 2021-03-18 DIAGNOSIS — E782 Mixed hyperlipidemia: Secondary | ICD-10-CM | POA: Diagnosis not present

## 2021-03-18 DIAGNOSIS — I110 Hypertensive heart disease with heart failure: Secondary | ICD-10-CM | POA: Diagnosis not present

## 2021-03-18 DIAGNOSIS — M48061 Spinal stenosis, lumbar region without neurogenic claudication: Secondary | ICD-10-CM | POA: Diagnosis not present

## 2021-03-18 DIAGNOSIS — I2510 Atherosclerotic heart disease of native coronary artery without angina pectoris: Secondary | ICD-10-CM | POA: Diagnosis not present

## 2021-03-18 DIAGNOSIS — F32A Depression, unspecified: Secondary | ICD-10-CM | POA: Diagnosis not present

## 2021-03-18 DIAGNOSIS — Z9682 Presence of neurostimulator: Secondary | ICD-10-CM | POA: Diagnosis not present

## 2021-03-18 DIAGNOSIS — Z452 Encounter for adjustment and management of vascular access device: Secondary | ICD-10-CM | POA: Diagnosis not present

## 2021-03-18 DIAGNOSIS — N179 Acute kidney failure, unspecified: Secondary | ICD-10-CM | POA: Diagnosis not present

## 2021-03-18 DIAGNOSIS — Z792 Long term (current) use of antibiotics: Secondary | ICD-10-CM | POA: Diagnosis not present

## 2021-03-18 DIAGNOSIS — I708 Atherosclerosis of other arteries: Secondary | ICD-10-CM | POA: Diagnosis not present

## 2021-03-18 DIAGNOSIS — A419 Sepsis, unspecified organism: Secondary | ICD-10-CM | POA: Diagnosis not present

## 2021-03-18 DIAGNOSIS — Z9181 History of falling: Secondary | ICD-10-CM | POA: Diagnosis not present

## 2021-03-18 DIAGNOSIS — G8929 Other chronic pain: Secondary | ICD-10-CM | POA: Diagnosis not present

## 2021-03-18 DIAGNOSIS — G2581 Restless legs syndrome: Secondary | ICD-10-CM | POA: Diagnosis not present

## 2021-03-18 DIAGNOSIS — I5043 Acute on chronic combined systolic (congestive) and diastolic (congestive) heart failure: Secondary | ICD-10-CM | POA: Diagnosis not present

## 2021-03-18 DIAGNOSIS — M19011 Primary osteoarthritis, right shoulder: Secondary | ICD-10-CM | POA: Diagnosis not present

## 2021-03-18 DIAGNOSIS — Z7902 Long term (current) use of antithrombotics/antiplatelets: Secondary | ICD-10-CM | POA: Diagnosis not present

## 2021-03-18 DIAGNOSIS — J9601 Acute respiratory failure with hypoxia: Secondary | ICD-10-CM | POA: Diagnosis not present

## 2021-03-18 DIAGNOSIS — M19012 Primary osteoarthritis, left shoulder: Secondary | ICD-10-CM | POA: Diagnosis not present

## 2021-03-19 ENCOUNTER — Ambulatory Visit: Payer: PPO | Admitting: Internal Medicine

## 2021-03-19 DIAGNOSIS — I350 Nonrheumatic aortic (valve) stenosis: Secondary | ICD-10-CM | POA: Diagnosis not present

## 2021-03-19 DIAGNOSIS — R7881 Bacteremia: Secondary | ICD-10-CM | POA: Diagnosis not present

## 2021-03-19 DIAGNOSIS — Z79899 Other long term (current) drug therapy: Secondary | ICD-10-CM | POA: Diagnosis not present

## 2021-03-19 DIAGNOSIS — I330 Acute and subacute infective endocarditis: Secondary | ICD-10-CM | POA: Diagnosis not present

## 2021-03-19 DIAGNOSIS — A419 Sepsis, unspecified organism: Secondary | ICD-10-CM | POA: Diagnosis not present

## 2021-03-19 DIAGNOSIS — I10 Essential (primary) hypertension: Secondary | ICD-10-CM | POA: Diagnosis not present

## 2021-03-19 DIAGNOSIS — R739 Hyperglycemia, unspecified: Secondary | ICD-10-CM | POA: Diagnosis not present

## 2021-03-21 ENCOUNTER — Other Ambulatory Visit: Payer: Self-pay

## 2021-03-21 ENCOUNTER — Emergency Department (HOSPITAL_COMMUNITY): Payer: PPO

## 2021-03-21 ENCOUNTER — Emergency Department (HOSPITAL_COMMUNITY)
Admission: EM | Admit: 2021-03-21 | Discharge: 2021-03-22 | Disposition: A | Discharge status: Home / Self Care | Payer: PPO | Attending: Emergency Medicine | Admitting: Emergency Medicine

## 2021-03-21 DIAGNOSIS — T82898A Other specified complication of vascular prosthetic devices, implants and grafts, initial encounter: Secondary | ICD-10-CM

## 2021-03-21 DIAGNOSIS — I5042 Chronic combined systolic (congestive) and diastolic (congestive) heart failure: Secondary | ICD-10-CM | POA: Insufficient documentation

## 2021-03-21 DIAGNOSIS — F17210 Nicotine dependence, cigarettes, uncomplicated: Secondary | ICD-10-CM | POA: Insufficient documentation

## 2021-03-21 DIAGNOSIS — I2510 Atherosclerotic heart disease of native coronary artery without angina pectoris: Secondary | ICD-10-CM | POA: Insufficient documentation

## 2021-03-21 DIAGNOSIS — T82868A Thrombosis of vascular prosthetic devices, implants and grafts, initial encounter: Secondary | ICD-10-CM | POA: Insufficient documentation

## 2021-03-21 DIAGNOSIS — Z452 Encounter for adjustment and management of vascular access device: Secondary | ICD-10-CM

## 2021-03-21 DIAGNOSIS — I110 Hypertensive heart disease with heart failure: Secondary | ICD-10-CM | POA: Diagnosis not present

## 2021-03-21 DIAGNOSIS — I10 Essential (primary) hypertension: Secondary | ICD-10-CM | POA: Diagnosis not present

## 2021-03-21 DIAGNOSIS — Z955 Presence of coronary angioplasty implant and graft: Secondary | ICD-10-CM | POA: Insufficient documentation

## 2021-03-21 DIAGNOSIS — Z79899 Other long term (current) drug therapy: Secondary | ICD-10-CM | POA: Diagnosis not present

## 2021-03-21 DIAGNOSIS — J9811 Atelectasis: Secondary | ICD-10-CM | POA: Diagnosis not present

## 2021-03-21 NOTE — ED Triage Notes (Signed)
Pt states difficulty flushing PICC line right arm today. Unable to be flushed by home health RN, recommended ED visit. PICC line last used earlier today. PICC line in place for IV ABX

## 2021-03-21 NOTE — ED Provider Notes (Signed)
Emergency Medicine Provider Triage Evaluation Note  Ruben Barrera , a 87 y.o. male  was evaluated in triage.  Pt complains of picc line problem. States picc line will not flush  Review of Systems  Positive: Picc line will not flush Negative: fever  Physical Exam  BP (!) 180/86    Pulse 76    Temp 97.7 F (36.5 C) (Oral)    Resp 16    SpO2 95%  Gen:   Awake, no distress   Resp:  Normal effort  MSK:   Moves extremities without difficulty   Medical Decision Making  Medically screening exam initiated at 10:56 PM.  Appropriate orders placed.  [name] was informed that the remainder of the evaluation will be completed by another provider, this initial triage assessment does not replace that evaluation, and the importance of remaining in the ED until their evaluation is complete.     Rodney Booze, PA-C 03/21/21 2257    Veryl Speak, MD 03/21/21 2308

## 2021-03-22 MED ORDER — ALTEPLASE 100 MG IV SOLR
2.0000 mg | Freq: Once | INTRAVENOUS | Status: DC
  Filled 2021-03-22: qty 100

## 2021-03-22 MED ORDER — ALTEPLASE 2 MG IJ SOLR: 2.0000 mg | Freq: Once | INTRAMUSCULAR | Status: DC

## 2021-03-22 NOTE — ED Notes (Signed)
New order placed with IV team.  IV team paged.

## 2021-03-22 NOTE — ED Notes (Signed)
Pt stated he was in a hurry and could read the discharge instructions on mychart.  PA notified.

## 2021-03-22 NOTE — ED Notes (Signed)
IV team re-paged.

## 2021-03-22 NOTE — Discharge Instructions (Addendum)
Your evaluated in the emergency room for an issue with your PICC line.  Your evaluated by IV team and had resolution of this issue.  You elected to go home for your scheduled antibiotics.  If you have ongoing issues with your PICC line please return to the emergency room.

## 2021-03-22 NOTE — ED Provider Notes (Addendum)
[facility] EMERGENCY DEPARTMENT Provider Note   CSN: [id number] Arrival date & time: 03/21/21  2106     History Chief Complaint  Patient presents with   Vascular Access Problem    [name] is a 87 y.o. male.  87 year old male presents today for evaluation of occlusion of PICC line.  Patient reports yesterday afternoon for his scheduled home antibiotic dose his wife was unable to get his PICC line to flush.  Home health nurse also came out and was unable to get the PICC line to flush so he was referred to the emergency room.  He denies any fever, chills or any other complaints.  He is on IV antibiotics secondary to bacteremia from Streptococcus intermedius.  He is on 6 week course.  Reports earlier in the week he might of pulled the PICC line as he was getting up from his chair.  However he has been using his PICC line since that incident without issue.  The history is provided by the patient. No language interpreter was used.      Past Medical History:  Diagnosis Date   Arthritis    Coronary artery disease    Depression    Dysrhythmia    "extra heart beat"   GERD (gastroesophageal reflux disease)    History of hiatal hernia    Hypercholesteremia    Hypertension    Lumbar spinal stenosis    RLS (restless legs syndrome)    takes ativan as needed   Stroke ([place]) 01/2020   minor - no deficits    Patient Active Problem List   Diagnosis Date Noted   Bacteremia due to Streptococcus 02/28/2021   Acute on chronic combined systolic and diastolic CHF (congestive heart failure) ([place]) 02/26/2021   RLS (restless legs syndrome) 02/26/2021   AKI (acute kidney injury) ([place]) 02/26/2021   Acute hypoxemic respiratory failure ([place]) 02/24/2021   S/P insertion of spinal cord stimulator 07/10/2020   Frequent PVCs 04/15/2020   Preop cardiovascular exam 04/15/2020   HFrEF (heart failure with reduced ejection fraction) ([place]) 04/15/2020   SVT (supraventricular  tachycardia) ([place]) 04/15/2020   Coronary artery disease involving native coronary artery of native heart without angina pectoris 04/15/2020   Moderate to severe aortic stenosis 03/19/2020   Dyslipidemia    Chronic systolic congestive heart failure (HCC)    Acute ischemic cerebrovascular accident (CVA) involving anterior cerebral artery territory ([place]) 02/22/2020   Mixed hyperlipidemia    Tobacco abuse    Depression    Acute stroke due to occlusion of right cerebellar artery ([place]) 02/20/2020   Erythrocytosis 02/20/2020   BPH (benign prostatic hyperplasia) 02/20/2020   HNP (herniated nucleus pulposus), lumbar 01/13/2016   Lumbar stenosis with neurogenic claudication 08/28/2015   DOE (dyspnea on exertion) 05/01/2014   Essential hypertension 05/01/2014   Left shoulder pain 05/05/2011    Past Surgical History:  Procedure Laterality Date   BACK SURGERY     COLONOSCOPY     EYE SURGERY Bilateral    cataracts removed    LUMBAR LAMINECTOMY/DECOMPRESSION MICRODISCECTOMY N/A 08/28/2015   Procedure: Lumbar Four-Five decompressive lumbar laminectomy;  Surgeon: Jovita Gamma, MD;  Location: [facility];  Service: Neurosurgery;  Laterality: N/A;   REPLACEMENT TOTAL KNEE BILATERAL Bilateral 11/05/2003   RIGHT/LEFT HEART CATH AND CORONARY ANGIOGRAPHY N/A 03/28/2020   Procedure: RIGHT/LEFT HEART CATH AND CORONARY ANGIOGRAPHY;  Surgeon: Burnell Blanks, MD;  Location: [place] CV LAB;  Service: Cardiovascular;  Laterality: N/A;   TEE WITHOUT CARDIOVERSION  N/A 02/28/2021   Procedure: TRANSESOPHAGEAL ECHOCARDIOGRAM (TEE);  Surgeon: Freada Bergeron, MD;  Location: [facility];  Service: Cardiovascular;  Laterality: N/A;   THORACIC DISCECTOMY N/A 07/10/2020   Procedure: Spinal cord stimulator via  - Thoracic Eight-Thoracic Nine, Thoracic Nine-Thoracic Ten Laminectomy;  Surgeon: Eustace Moore, MD;  Location: [facility];  Service: Neurosurgery;  Laterality: N/A;  3C   TOE AMPUTATION Right 2010    second toe   TONSILLECTOMY         Family History  Problem Relation Age of Onset   Stroke Mother    Hypertension Other        family history    Social History   Tobacco Use   Smoking status: Some Days    Types: Cigars   Smokeless tobacco: Never   Tobacco comments:    quit 1972- currently 1 cigar per week  Vaping Use   Vaping Use: Never used  Substance Use Topics   Alcohol use: Yes    Comment: occasionally beer/wine or scotch   Drug use: No    Comment: 1 cigar per week    Home Medications Prior to Admission medications   Medication Sig Start Date End Date Taking? Authorizing Provider  Baclofen 5 MG TABS Take 5 mg by mouth 3 (three) times daily as needed. 04/04/20   Raulkar, Clide Deutscher, MD  celecoxib (CELEBREX) 200 MG capsule Take 200 mg by mouth daily. 03/15/20   [provider]  citalopram (CELEXA) 20 MG tablet Take 20 mg by mouth daily.    [provider]  clopidogrel (PLAVIX) 75 MG tablet Take 1 tablet (75 mg total) by mouth daily. 10/15/20   Pieter Partridge, DO  ezetimibe (ZETIA) 10 MG tablet Take 1 tablet (10 mg total) by mouth every evening. 02/26/20   Angiulli, Lavon Paganini, PA-C  fluticasone (FLONASE) 50 MCG/ACT nasal spray Place 1 spray into both nostrils daily as needed for allergies.     [provider]  furosemide (LASIX) 20 MG tablet Take 1 tablet (20 mg total) by mouth daily. 03/21/20   Chandrasekhar, Lyda Kalata A, MD  gabapentin (NEURONTIN) 400 MG capsule Take 400 mg by mouth at bedtime as needed (pain). 03/15/20   [provider]  gabapentin (NEURONTIN) 600 MG tablet Take 1 tablet (600 mg total) by mouth 3 (three) times daily. 02/26/20   Angiulli, Lavon Paganini, PA-C  HYDROcodone-acetaminophen (NORCO) 7.5-325 MG tablet Take 1 tablet by mouth every 8 (eight) hours as needed for moderate pain. 02/26/20   Angiulli, Lavon Paganini, PA-C  LORazepam (ATIVAN) 1 MG tablet Take 1 tablet (1 mg total) by mouth every 8 (eight) hours as needed for anxiety.  02/26/20   Angiulli, Lavon Paganini, PA-C  Magnesium 250 MG TABS Take 250 mg by mouth daily in the afternoon.    [provider]  metoprolol succinate (TOPROL-XL) 25 MG 24 hr tablet Take 25 mg by mouth daily. 03/27/20   Rudean Haskell A, MD  MULTIPLE VITAMIN PO Take 1 tablet by mouth daily.     [provider]  omeprazole (PRILOSEC) 20 MG capsule Take 20 mg by mouth daily.    [provider]  penicillin G IVPB Inject 24 Million Units into the vein daily. Indication:  Streptococcal MV Endocarditis First Dose: Yes Last Day of Therapy:  04/09/21 Labs - Once weekly:  CBC/D and BMP, Labs - Every other week:  ESR and CRP Method of administration: Elastomeric (Continuous infusion) Method of administration may be changed at the discretion  of home infusion pharmacist based upon assessment of the patient and/or caregiver's ability to self-administer the medication ordered. 02/28/21   Caren Griffins, MD  pravastatin (PRAVACHOL) 20 MG tablet Take 1 tablet (20 mg total) by mouth daily at 6 PM. 02/26/20   Angiulli, Lavon Paganini, PA-C  saccharomyces boulardii (FLORASTOR) 250 MG capsule Take 250 mg by mouth daily in the afternoon. 05/06/18   [provider]  senna-docusate (SENOKOT-S) 8.6-50 MG tablet Take 1 tablet by mouth 2 (two) times daily. Patient taking differently: Take 2 tablets by mouth at bedtime. 02/26/20   Angiulli, Lavon Paganini, PA-C  tamsulosin (FLOMAX) 0.4 MG CAPS capsule Take 1 capsule (0.4 mg total) by mouth daily. Patient taking differently: Take 0.4 mg by mouth daily after supper. 02/26/20   Angiulli, Lavon Paganini, PA-C  triamcinolone (KENALOG) 0.1 % Apply 1 application topically daily as needed (irritation).    [provider]    Allergies    Simvastatin, Atorvastatin, and Benazepril hcl  Review of Systems   Review of Systems  Constitutional:  Negative for chills and fever.  Respiratory:  Negative for shortness of breath.   Gastrointestinal:  Negative for  abdominal pain.  All other systems reviewed and are negative.  Physical Exam Updated Vital Signs BP (!) 165/105 (BP Location: Left Arm)    Pulse 80    Temp 97.7 F (36.5 C) (Oral)    Resp 18    SpO2 95%   Physical Exam Vitals and nursing note reviewed.  Constitutional:      General: He is not in acute distress.    Appearance: Normal appearance. He is not ill-appearing.  HENT:     Head: Normocephalic and atraumatic.     Nose: Nose normal.  Eyes:     General: No scleral icterus.    Extraocular Movements: Extraocular movements intact.     Conjunctiva/sclera: Conjunctivae normal.  Cardiovascular:     Rate and Rhythm: Normal rate and regular rhythm.     Pulses: Normal pulses.     Heart sounds: Normal heart sounds.  Pulmonary:     Effort: Pulmonary effort is normal. No respiratory distress.     Breath sounds: Normal breath sounds. No wheezing or rales.  Abdominal:     General: There is no distension.     Tenderness: There is no abdominal tenderness.  Musculoskeletal:        General: Normal range of motion.     Cervical back: Normal range of motion.  Skin:    General: Skin is warm and dry.  Neurological:     General: No focal deficit present.     Mental Status: He is alert. Mental status is at baseline.    ED Results / Procedures / Treatments   Labs (all labs ordered are listed, but only abnormal results are displayed) Labs Reviewed - No data to display  EKG None  Radiology DG Chest 1 View  Result Date: 03/21/2021 CLINICAL DATA:  PICC line will not flush. EXAM: CHEST  1 VIEW COMPARISON:  February 25, 2021 FINDINGS: A right-sided PICC line is seen with its distal tip adjacent to the superior mediastinum on the right. Mild, chronic appearing increased lung markings are seen with very mild areas of bibasilar atelectasis. There is no evidence of a pleural effusion or pneumothorax. The heart size and mediastinal contours are within normal limits. Degenerative changes are noted  throughout the thoracic spine. IMPRESSION: 1. Right-sided PICC line positioning as described above. 2. Mild, chronic appearing increased lung  markings with very mild bibasilar atelectasis. Electronically Signed   By: Virgina Norfolk M.D.   On: 03/21/2021 23:35    Procedures Procedures   Medications Ordered in ED Medications  alteplase (CATHFLO ACTIVASE) injection 2 mg (has no administration in time range)    ED Course  I have reviewed the triage vital signs and the nursing notes.  Pertinent labs & imaging results that were available during my care of the patient were reviewed by me and considered in my medical decision making (see chart for details).    MDM Rules/Calculators/A&P                         Patient presented for occluded PICC line.  Patient was evaluated by IV team had hardware on PICC line switched with resolution of the occlusion.  PICC line appears to be in place without any trauma or visual issue.  Per nurse it appeared to have been a cap issue.  Given extensive wait time patient elected to go home and do home antibiotic treatment as opposed to getting his dose here.  Given he is stable and has supply at home this is reasonable.  Return precautions discussed with patient and wife.  They voiced understanding and are in agreement with plan.     Final Clinical Impression(s) / ED Diagnoses Final diagnoses:  Occlusion of peripherally inserted central catheter (PICC) line, initial encounter [facility])    Rx / [place] Orders ED Discharge Orders     None        Evlyn Courier, PA-C 03/22/21 1216    Evlyn Courier, PA-C 03/22/21 1217    Charlesetta Shanks, MD 03/26/21 1030

## 2021-03-23 DIAGNOSIS — A419 Sepsis, unspecified organism: Secondary | ICD-10-CM | POA: Diagnosis not present

## 2021-03-23 DIAGNOSIS — R7881 Bacteremia: Secondary | ICD-10-CM | POA: Diagnosis not present

## 2021-03-24 DIAGNOSIS — Z7902 Long term (current) use of antithrombotics/antiplatelets: Secondary | ICD-10-CM | POA: Diagnosis not present

## 2021-03-24 DIAGNOSIS — Z9682 Presence of neurostimulator: Secondary | ICD-10-CM | POA: Diagnosis not present

## 2021-03-24 DIAGNOSIS — I088 Other rheumatic multiple valve diseases: Secondary | ICD-10-CM | POA: Diagnosis not present

## 2021-03-24 DIAGNOSIS — I5043 Acute on chronic combined systolic (congestive) and diastolic (congestive) heart failure: Secondary | ICD-10-CM | POA: Diagnosis not present

## 2021-03-24 DIAGNOSIS — Z792 Long term (current) use of antibiotics: Secondary | ICD-10-CM | POA: Diagnosis not present

## 2021-03-24 DIAGNOSIS — F32A Depression, unspecified: Secondary | ICD-10-CM | POA: Diagnosis not present

## 2021-03-24 DIAGNOSIS — N400 Enlarged prostate without lower urinary tract symptoms: Secondary | ICD-10-CM | POA: Diagnosis not present

## 2021-03-24 DIAGNOSIS — I708 Atherosclerosis of other arteries: Secondary | ICD-10-CM | POA: Diagnosis not present

## 2021-03-24 DIAGNOSIS — I447 Left bundle-branch block, unspecified: Secondary | ICD-10-CM | POA: Diagnosis not present

## 2021-03-24 DIAGNOSIS — G2581 Restless legs syndrome: Secondary | ICD-10-CM | POA: Diagnosis not present

## 2021-03-24 DIAGNOSIS — M19011 Primary osteoarthritis, right shoulder: Secondary | ICD-10-CM | POA: Diagnosis not present

## 2021-03-24 DIAGNOSIS — M48061 Spinal stenosis, lumbar region without neurogenic claudication: Secondary | ICD-10-CM | POA: Diagnosis not present

## 2021-03-24 DIAGNOSIS — M19012 Primary osteoarthritis, left shoulder: Secondary | ICD-10-CM | POA: Diagnosis not present

## 2021-03-24 DIAGNOSIS — Z452 Encounter for adjustment and management of vascular access device: Secondary | ICD-10-CM | POA: Diagnosis not present

## 2021-03-24 DIAGNOSIS — I2510 Atherosclerotic heart disease of native coronary artery without angina pectoris: Secondary | ICD-10-CM | POA: Diagnosis not present

## 2021-03-24 DIAGNOSIS — N179 Acute kidney failure, unspecified: Secondary | ICD-10-CM | POA: Diagnosis not present

## 2021-03-24 DIAGNOSIS — I700 Atherosclerosis of aorta: Secondary | ICD-10-CM | POA: Diagnosis not present

## 2021-03-24 DIAGNOSIS — E782 Mixed hyperlipidemia: Secondary | ICD-10-CM | POA: Diagnosis not present

## 2021-03-24 DIAGNOSIS — G8929 Other chronic pain: Secondary | ICD-10-CM | POA: Diagnosis not present

## 2021-03-24 DIAGNOSIS — J9601 Acute respiratory failure with hypoxia: Secondary | ICD-10-CM | POA: Diagnosis not present

## 2021-03-24 DIAGNOSIS — Z9181 History of falling: Secondary | ICD-10-CM | POA: Diagnosis not present

## 2021-03-24 DIAGNOSIS — I110 Hypertensive heart disease with heart failure: Secondary | ICD-10-CM | POA: Diagnosis not present

## 2021-03-25 DIAGNOSIS — Z792 Long term (current) use of antibiotics: Secondary | ICD-10-CM | POA: Diagnosis not present

## 2021-03-25 DIAGNOSIS — Z5181 Encounter for therapeutic drug level monitoring: Secondary | ICD-10-CM | POA: Diagnosis not present

## 2021-03-26 ENCOUNTER — Ambulatory Visit: Payer: PPO | Admitting: Internal Medicine

## 2021-03-26 ENCOUNTER — Encounter: Payer: Self-pay | Admitting: Internal Medicine

## 2021-03-26 ENCOUNTER — Other Ambulatory Visit: Payer: Self-pay

## 2021-03-26 VITALS — BP 147/89 | HR 71 | Temp 97.3°F | Wt 186.0 lb

## 2021-03-26 DIAGNOSIS — I339 Acute and subacute endocarditis, unspecified: Secondary | ICD-10-CM | POA: Diagnosis not present

## 2021-03-26 DIAGNOSIS — A419 Sepsis, unspecified organism: Secondary | ICD-10-CM | POA: Diagnosis not present

## 2021-03-26 DIAGNOSIS — R7881 Bacteremia: Secondary | ICD-10-CM | POA: Diagnosis not present

## 2021-03-26 NOTE — Progress Notes (Signed)
Patient: Ruben Barrera  DOB: 01-17-1933 MRN: [id number] PCP: Lajean Manes, MD      Patient Active Problem List   Diagnosis Date Noted   Bacteremia due to Streptococcus 02/28/2021   Acute on chronic combined systolic and diastolic CHF (congestive heart failure) ([place]) 02/26/2021   RLS (restless legs syndrome) 02/26/2021   AKI (acute kidney injury) ([place]) 02/26/2021   Acute hypoxemic respiratory failure ([place]) 02/24/2021   S/P insertion of spinal cord stimulator 07/10/2020   Frequent PVCs 04/15/2020   Preop cardiovascular exam 04/15/2020   HFrEF (heart failure with reduced ejection fraction) ([place]) 04/15/2020   SVT (supraventricular tachycardia) ([place]) 04/15/2020   Coronary artery disease involving native coronary artery of native heart without angina pectoris 04/15/2020   Moderate to severe aortic stenosis 03/19/2020   Dyslipidemia    Chronic systolic congestive heart failure (HCC)    Acute ischemic cerebrovascular accident (CVA) involving anterior cerebral artery territory ([place]) 02/22/2020   Mixed hyperlipidemia    Tobacco abuse    Depression    Acute stroke due to occlusion of right cerebellar artery ([place]) 02/20/2020   Erythrocytosis 02/20/2020   BPH (benign prostatic hyperplasia) 02/20/2020   HNP (herniated nucleus pulposus), lumbar 01/13/2016   Lumbar stenosis with neurogenic claudication 08/28/2015   DOE (dyspnea on exertion) 05/01/2014   Essential hypertension 05/01/2014   Left shoulder pain 05/05/2011     HPI  Ruben Barrera is a 88 y.o. M with PMHX as below presents for hospital follow-up for Strep intermedius bacteremia with endocarditis. He was admitted 12/5-12/10 to [facility]. He presented with fever and respiratory distress. Found to have strep intermedius bacteremia. He noted that he had a dental procedure about 3-4 weeks prior to admission. Suspected this is what lead to bacteremia. TEE obtained which showed small mobile mass on the anterior and posterior  leaflets without evidence of valve destruction. Pt discharged on 6 week of antibiotics with penicillin via PICC line for endocarditis.  Interim: Seen in the ED 12/30 for PICC line issues. Line was not flushing, cap exchanged.   Today: He reports feeling well. No issues with PICC line currently.  Review of Systems  All other systems reviewed and are negative.  Past Medical History:  Diagnosis Date   Arthritis    Coronary artery disease    Depression    Dysrhythmia    "extra heart beat"   GERD (gastroesophageal reflux disease)    History of hiatal hernia    Hypercholesteremia    Hypertension    Lumbar spinal stenosis    RLS (restless legs syndrome)    takes ativan as needed   Stroke ([place]) 01/2020   minor - no deficits    Outpatient Medications Prior to Visit  Medication Sig Dispense Refill   Baclofen 5 MG TABS Take 5 mg by mouth 3 (three) times daily as needed. 90 tablet 0   celecoxib (CELEBREX) 200 MG capsule Take 200 mg by mouth daily.     citalopram (CELEXA) 20 MG tablet Take 20 mg by mouth daily.     clopidogrel (PLAVIX) 75 MG tablet Take 1 tablet (75 mg total) by mouth daily. 30 tablet 5   ezetimibe (ZETIA) 10 MG tablet Take 1 tablet (10 mg total) by mouth every evening. 30 tablet 0   fluticasone (FLONASE) 50 MCG/ACT nasal spray Place 1 spray into both nostrils daily as needed for allergies.      furosemide (LASIX) 20 MG tablet Take 1 tablet (20 mg total) by  mouth daily. 90 tablet 3   gabapentin (NEURONTIN) 400 MG capsule Take 400 mg by mouth at bedtime as needed (pain).     gabapentin (NEURONTIN) 600 MG tablet Take 1 tablet (600 mg total) by mouth 3 (three) times daily. 90 tablet 0   HYDROcodone-acetaminophen (NORCO) 7.5-325 MG tablet Take 1 tablet by mouth every 8 (eight) hours as needed for moderate pain. 20 tablet 0   LORazepam (ATIVAN) 1 MG tablet Take 1 tablet (1 mg total) by mouth every 8 (eight) hours as needed for anxiety. 10 tablet 0   Magnesium 250 MG TABS Take 250  mg by mouth daily in the afternoon.     metoprolol succinate (TOPROL-XL) 25 MG 24 hr tablet Take 25 mg by mouth daily.     MULTIPLE VITAMIN PO Take 1 tablet by mouth daily.      omeprazole (PRILOSEC) 20 MG capsule Take 20 mg by mouth daily.     penicillin G IVPB Inject 24 Million Units into the vein daily. Indication:  Streptococcal MV Endocarditis First Dose: Yes Last Day of Therapy:  04/09/21 Labs - Once weekly:  CBC/D and BMP, Labs - Every other week:  ESR and CRP Method of administration: Elastomeric (Continuous infusion) Method of administration may be changed at the discretion of home infusion pharmacist based upon assessment of the patient and/or caregiver's ability to self-administer the medication ordered. 40 Units 0   pravastatin (PRAVACHOL) 20 MG tablet Take 1 tablet (20 mg total) by mouth daily at 6 PM. 30 tablet 0   saccharomyces boulardii (FLORASTOR) 250 MG capsule Take 250 mg by mouth daily in the afternoon.     senna-docusate (SENOKOT-S) 8.6-50 MG tablet Take 1 tablet by mouth 2 (two) times daily. (Patient taking differently: Take 2 tablets by mouth at bedtime.)     tamsulosin (FLOMAX) 0.4 MG CAPS capsule Take 1 capsule (0.4 mg total) by mouth daily. (Patient taking differently: Take 0.4 mg by mouth daily after supper.) 30 capsule 0   triamcinolone (KENALOG) 0.1 % Apply 1 application topically daily as needed (irritation).     No facility-administered medications prior to visit.     Allergies  Allergen Reactions   Simvastatin Other (See Comments)    Leg pains    Atorvastatin Other (See Comments)    Muscle aches   Benazepril Hcl Other (See Comments)    Social History   Tobacco Use   Smoking status: Some Days    Types: Cigars   Smokeless tobacco: Never   Tobacco comments:    quit 1972- currently 1 cigar per week  Vaping Use   Vaping Use: Never used  Substance Use Topics   Alcohol use: Yes    Comment: occasionally beer/wine or scotch   Drug use: No    Comment:  1 cigar per week    Family History  Problem Relation Age of Onset   Stroke Mother    Hypertension Other        family history    Objective:  There were no vitals filed for this visit. There is no height or weight on file to calculate BMI.  Physical Exam Constitutional:      General: He is not in acute distress.    Appearance: He is normal weight. He is not toxic-appearing.  HENT:     Head: Normocephalic and atraumatic.     Right Ear: External ear normal.     Left Ear: External ear normal.     Nose: No congestion or rhinorrhea.  Mouth/Throat:     Mouth: Mucous membranes are moist.     Pharynx: Oropharynx is clear.  Eyes:     Extraocular Movements: Extraocular movements intact.     Conjunctiva/sclera: Conjunctivae normal.     Pupils: Pupils are equal, round, and reactive to light.  Cardiovascular:     Rate and Rhythm: Normal rate and regular rhythm.     Heart sounds: No murmur heard.   No friction rub. No gallop.  Pulmonary:     Effort: Pulmonary effort is normal.     Breath sounds: Normal breath sounds.  Abdominal:     General: Abdomen is flat. Bowel sounds are normal.     Palpations: Abdomen is soft.  Musculoskeletal:        General: No swelling. Normal range of motion.     Cervical back: Normal range of motion and neck supple.  Skin:    General: Skin is warm and dry.     Comments: Right PICC line C/D/I  Neurological:     General: No focal deficit present.     Mental Status: He is oriented to person, place, and time.  Psychiatric:        Mood and Affect: Mood normal.    Lab Results: Lab Results  Component Value Date   WBC 11.6 (H) 02/27/2021   HGB 13.7 02/27/2021   HCT 40.8 02/27/2021   MCV 96.0 02/27/2021   PLT 193 02/27/2021    Lab Results  Component Value Date   CREATININE 1.23 02/27/2021   BUN 25 (H) 02/27/2021   NA 134 (L) 02/27/2021   K 3.7 02/27/2021   CL 100 02/27/2021   CO2 24 02/27/2021    Lab Results  Component Value Date   ALT 21  02/24/2021   AST 28 02/24/2021   ALKPHOS 71 02/24/2021   BILITOT 1.5 (H) 02/24/2021     Assessment & Plan:    #Strep intermedius bacteremia with mitral valve endocarditis 2/2 seeding from dental procedure -Continue penicillin x 6 weeks from negative Cx(12/8-1/18/23) -Pull PICC after last dose of antibiotics -Follow up in 1 month, plan to do surveillance blood Cx at that point.   Laurice Record, MD [facility]   03/26/21  2:59 PM

## 2021-03-31 DIAGNOSIS — Z5181 Encounter for therapeutic drug level monitoring: Secondary | ICD-10-CM | POA: Diagnosis not present

## 2021-03-31 DIAGNOSIS — Z792 Long term (current) use of antibiotics: Secondary | ICD-10-CM | POA: Diagnosis not present

## 2021-04-02 DIAGNOSIS — Z9689 Presence of other specified functional implants: Secondary | ICD-10-CM | POA: Diagnosis not present

## 2021-04-02 DIAGNOSIS — M961 Postlaminectomy syndrome, not elsewhere classified: Secondary | ICD-10-CM | POA: Diagnosis not present

## 2021-04-02 DIAGNOSIS — G894 Chronic pain syndrome: Secondary | ICD-10-CM | POA: Diagnosis not present

## 2021-04-04 DIAGNOSIS — A419 Sepsis, unspecified organism: Secondary | ICD-10-CM | POA: Diagnosis not present

## 2021-04-04 DIAGNOSIS — R7881 Bacteremia: Secondary | ICD-10-CM | POA: Diagnosis not present

## 2021-04-07 DIAGNOSIS — I5043 Acute on chronic combined systolic (congestive) and diastolic (congestive) heart failure: Secondary | ICD-10-CM | POA: Diagnosis not present

## 2021-04-07 DIAGNOSIS — N179 Acute kidney failure, unspecified: Secondary | ICD-10-CM | POA: Diagnosis not present

## 2021-04-07 DIAGNOSIS — Z5181 Encounter for therapeutic drug level monitoring: Secondary | ICD-10-CM | POA: Diagnosis not present

## 2021-04-07 DIAGNOSIS — I447 Left bundle-branch block, unspecified: Secondary | ICD-10-CM | POA: Diagnosis not present

## 2021-04-07 DIAGNOSIS — Z7902 Long term (current) use of antithrombotics/antiplatelets: Secondary | ICD-10-CM | POA: Diagnosis not present

## 2021-04-07 DIAGNOSIS — Z9181 History of falling: Secondary | ICD-10-CM | POA: Diagnosis not present

## 2021-04-07 DIAGNOSIS — J9601 Acute respiratory failure with hypoxia: Secondary | ICD-10-CM | POA: Diagnosis not present

## 2021-04-07 DIAGNOSIS — M19011 Primary osteoarthritis, right shoulder: Secondary | ICD-10-CM | POA: Diagnosis not present

## 2021-04-07 DIAGNOSIS — M48061 Spinal stenosis, lumbar region without neurogenic claudication: Secondary | ICD-10-CM | POA: Diagnosis not present

## 2021-04-07 DIAGNOSIS — E782 Mixed hyperlipidemia: Secondary | ICD-10-CM | POA: Diagnosis not present

## 2021-04-07 DIAGNOSIS — I2510 Atherosclerotic heart disease of native coronary artery without angina pectoris: Secondary | ICD-10-CM | POA: Diagnosis not present

## 2021-04-07 DIAGNOSIS — I700 Atherosclerosis of aorta: Secondary | ICD-10-CM | POA: Diagnosis not present

## 2021-04-07 DIAGNOSIS — I088 Other rheumatic multiple valve diseases: Secondary | ICD-10-CM | POA: Diagnosis not present

## 2021-04-07 DIAGNOSIS — G8929 Other chronic pain: Secondary | ICD-10-CM | POA: Diagnosis not present

## 2021-04-07 DIAGNOSIS — Z792 Long term (current) use of antibiotics: Secondary | ICD-10-CM | POA: Diagnosis not present

## 2021-04-07 DIAGNOSIS — I708 Atherosclerosis of other arteries: Secondary | ICD-10-CM | POA: Diagnosis not present

## 2021-04-07 DIAGNOSIS — N400 Enlarged prostate without lower urinary tract symptoms: Secondary | ICD-10-CM | POA: Diagnosis not present

## 2021-04-07 DIAGNOSIS — M19012 Primary osteoarthritis, left shoulder: Secondary | ICD-10-CM | POA: Diagnosis not present

## 2021-04-07 DIAGNOSIS — I110 Hypertensive heart disease with heart failure: Secondary | ICD-10-CM | POA: Diagnosis not present

## 2021-04-07 DIAGNOSIS — Z452 Encounter for adjustment and management of vascular access device: Secondary | ICD-10-CM | POA: Diagnosis not present

## 2021-04-07 DIAGNOSIS — G2581 Restless legs syndrome: Secondary | ICD-10-CM | POA: Diagnosis not present

## 2021-04-07 DIAGNOSIS — Z9682 Presence of neurostimulator: Secondary | ICD-10-CM | POA: Diagnosis not present

## 2021-04-07 DIAGNOSIS — F32A Depression, unspecified: Secondary | ICD-10-CM | POA: Diagnosis not present

## 2021-04-08 ENCOUNTER — Encounter: Payer: Self-pay | Admitting: Physician Assistant

## 2021-04-08 ENCOUNTER — Ambulatory Visit: Payer: PPO | Admitting: Physician Assistant

## 2021-04-08 ENCOUNTER — Ambulatory Visit: Payer: PPO | Admitting: Neurology

## 2021-04-08 ENCOUNTER — Other Ambulatory Visit: Payer: Self-pay

## 2021-04-08 VITALS — BP 139/65 | HR 96 | Resp 20 | Ht 68.0 in | Wt 180.0 lb

## 2021-04-08 DIAGNOSIS — G459 Transient cerebral ischemic attack, unspecified: Secondary | ICD-10-CM

## 2021-04-08 MED ORDER — CLOPIDOGREL BISULFATE 75 MG PO TABS: 75.0000 mg | ORAL_TABLET | Freq: Every day | ORAL | 5 refills | Status: DC

## 2021-04-08 NOTE — Progress Notes (Signed)
NEUROLOGY FOLLOW UP OFFICE NOTE  Ruben Barrera [id number]  Assessment/Plan:   Recent transient ischemic attack presenting as expressive aphasia Right cerebellar peduncle infarct, likely secondary to small vessel disease Hypertension Hyperlipidemia History of left carotid artery disease - recent carotid ultrasound does not reveal hemodynamically significant ICA stenosis  No change in management: - Plavix - Statin BP is controlled, he is not taking any antihypertensives Continue physical therapy for strength and balance.  Subjective:    Ruben Barrera is an 88 year old right-handed male with HTN, HLD, cigar smoker, and lumbar stenosis with chronic back pain and spinal cord stimulator who follows up for recent stroke.     UPDATE Current medications:  Plavix 53m daily,  pravastatin 2108m Zetia   On 10/07/2020, he was unable to get words out/effortful speech for about 10 minutes.  No slurred speech, facial droop or unilateral weakness or numbness.  He reports that he was under some stress and had also overexerted himself exercising on the treadmill.  ASA was changed to Plavix.  Carotid ultrasound on 10/08/2020 showed 1-39% stenosis of the bilateral ICAs, antegrade flow of vertebral artery bilaterally.  Currently feeling well.  And he denies any new TIA or stroke symptoms. Since his last visit, the patient was admitted in the hospital for sepsis due to Streptococcus intermedius bacteremia, requiring ID involvement, TEE showed findings concerning for endocarditis, he was placed on 6 weeks of IV antibiotics and a peak line prior to discharge, he is due for his last dose of vancomycin tomorrow.  During that admission, he also had acute hypoxic respiratory failure due to probably acute on chronic CHF, acute kidney injury, which would required ICU placement.  Upon discharge, he is being followed by cardiology.   HISTORY:   Initially saw him for bilateral lower extremity numbness.  Patient has  chronic low back pain and spinal stenosis status post PLIF L4-5 and has had numerous lumbar spine MRIs.  Last MRI on 08/20/2018 personally reviewed showed severe spinal canal and bi foraminal stenosis at L3-4, right foraminal stenosis at L1-2 and PLIF L4-5 without significant stenosis. Following his second COVID vaccine, he developed worsening bilateral sciatic pain and weakness.  He also developed numbness of both feet as well as burning, which is worse when standing on hard surfaces and improved when barefoot or when he is off his feet.  During that time, he exhibited dizziness and slurred speech and went to the ED on 05/24/2019 where MRI of brain was negative for acute infarct and CTA of head and neck showed no hemodynamically significant stenosis or occlusion.  It was thought to be a reaction to the vaccine.  He had an epidural injection on 05/26/2019 which was ineffective.  He was sent to pain management where he was started on hydrocodone and gabapentin.  He underwent a second injection in early June, which helped the radicular pain but continues to have numbness and burning in the feet.  He reportedly was checked for diabetes and his number was "mildly elevated".  Neuropathy labs from June 2021 included negative ANA, sed rate 5, B12 398, folate >24.8, SPEP/IFE negative for monoclonal protein.  NCV-EMG in August showed chronic bilateral L5-S1 radiculopathy and chronic right L3-4 radiculopathy with no evidence of large fiber sensorimotor polyneuropathy.   [facility] cerebellar stroke, presenting with recurrent double vision and difficulty writing his name.  CT of head showed hypodensity in the right cerebellar peduncle.  Follow  up MRI of brain showed possible subacute rounded infarct in the right cerebellar peduncle.  MRI of brain with contrast demonstrated no abnormal enhancement.  CTA of head and neck showed 70% stenosis at the left ICA origin and high-grade right P2  and left P3 stenosis.  2D echocardiogram showed EF 30-35% with global hypokinesis.  LDL was 122 and Hgb A1c 5.6.  He was started on ASA 47m and Plavix 791mdaily for 3 weeks followed by ASA alone.  Was on Zetia.  Unable to tolerate simvastatin.  In hospital, low-dose pravastatin 2043mas added.  Cardiology consulted for ischemic workup and outpatient cardiac monitoring to rule out a fib.  Vascular surgery and cardiology follow up was ordered.  Cardiac event monitor was negative for atrial fibrillation.  Overall, symptoms have resolved.  During the day, when he is tired, he may slur his speech and feel a little more off-balance.  PAST MEDICAL HISTORY: Past Medical History:  Diagnosis Date   Arthritis    Coronary artery disease    Depression    Dysrhythmia    "extra heart beat"   GERD (gastroesophageal reflux disease)    History of hiatal hernia    Hypercholesteremia    Hypertension    Lumbar spinal stenosis    RLS (restless legs syndrome)    takes ativan as needed   Stroke ([place]   minor - no deficits    MEDICATIONS: Current Outpatient Medications on File Prior to Visit  Medication Sig Dispense Refill   Baclofen 5 MG TABS Take 5 mg by mouth 3 (three) times daily as needed. 90 tablet 0   celecoxib (CELEBREX) 200 MG capsule Take 200 mg by mouth daily.     citalopram (CELEXA) 20 MG tablet Take 20 mg by mouth daily.     ezetimibe (ZETIA) 10 MG tablet Take 1 tablet (10 mg total) by mouth every evening. 30 tablet 0   fluticasone (FLONASE) 50 MCG/ACT nasal spray Place 1 spray into both nostrils daily as needed for allergies.      furosemide (LASIX) 20 MG tablet Take 1 tablet (20 mg total) by mouth daily. 90 tablet 3   gabapentin (NEURONTIN) 400 MG capsule Take 400 mg by mouth at bedtime as needed (pain).     gabapentin (NEURONTIN) 600 MG tablet Take 1 tablet (600 mg total) by mouth 3 (three) times daily. 90 tablet 0   HYDROcodone-acetaminophen (NORCO) 7.5-325 MG tablet Take 1 tablet  by mouth every 8 (eight) hours as needed for moderate pain. 20 tablet 0   LORazepam (ATIVAN) 1 MG tablet Take 1 tablet (1 mg total) by mouth every 8 (eight) hours as needed for anxiety. 10 tablet 0   Magnesium 250 MG TABS Take 250 mg by mouth daily in the afternoon.     MULTIPLE VITAMIN PO Take 1 tablet by mouth daily.      omeprazole (PRILOSEC) 20 MG capsule Take 20 mg by mouth daily.     penicillin G IVPB Inject 24 Million Units into the vein daily. Indication:  Streptococcal MV Endocarditis First Dose: Yes Last Day of Therapy:  04/09/21 Labs - Once weekly:  CBC/D and BMP, Labs - Every other week:  ESR and CRP Method of administration: Elastomeric (Continuous infusion) Method of administration may be changed at the discretion of home infusion pharmacist based upon assessment of the patient and/or caregiver's ability to self-administer the medication ordered. 40 Units 0   pravastatin (PRAVACHOL) 20 MG tablet Take 1 tablet (20 mg total)  by mouth daily at 6 PM. 30 tablet 0   saccharomyces boulardii (FLORASTOR) 250 MG capsule Take 250 mg by mouth daily in the afternoon.     senna-docusate (SENOKOT-S) 8.6-50 MG tablet Take 1 tablet by mouth 2 (two) times daily. (Patient taking differently: Take 2 tablets by mouth at bedtime.)     tamsulosin (FLOMAX) 0.4 MG CAPS capsule Take 1 capsule (0.4 mg total) by mouth daily. (Patient taking differently: Take 0.4 mg by mouth daily after supper.) 30 capsule 0   triamcinolone (KENALOG) 0.1 % Apply 1 application topically daily as needed (irritation).     metoprolol succinate (TOPROL-XL) 25 MG 24 hr tablet Take 25 mg by mouth daily. (Patient not taking: Reported on 04/08/2021)     No current facility-administered medications on file prior to visit.    ALLERGIES: Allergies  Allergen Reactions   Simvastatin Other (See Comments)    Leg pains    Atorvastatin Other (See Comments)    Muscle aches   Benazepril Hcl Other (See Comments)   Losartan Other (See  Comments)   Pravastatin Other (See Comments)   Rosuvastatin Other (See Comments)   Spironolactone Other (See Comments)    FAMILY HISTORY: Family History  Problem Relation Age of Onset   Stroke Mother    Hypertension Other        family history      Objective:  BP 139/65    Pulse 96    Resp 20    Ht '5\' 8"'  (1.727 m)    Wt 180 lb (81.6 kg)    SpO2 96%    BMI 27.37 kg/m   General: No acute distress.  Patient appears well-groomed.   Head:  Normocephalic/atraumatic Eyes:  Fundi examined but not visualized Neck: supple, no paraspinal tenderness, full range of motion Heart:  Regular rate and rhythm Lungs:  Clear to auscultation bilaterally Back: No paraspinal tenderness Neurological Exam: alert and oriented to person, place, and time.  Speech fluent and not dysarthric, language intact.  CN II-XII intact. Bulk and tone normal, muscle strength 5/5 throughout.  Sensation to light touch intact.  Deep tendon reflexes 2+ throughout, toes downgoing.  Finger to nose testing intact.  Wide based gait.  Romberg with sway.  He uses a right cane to ambulate.  Sharene Butters, PA-C   CC: Lajean Manes, MD

## 2021-04-08 NOTE — Patient Instructions (Addendum)
Continue clopidogrel 75mg  daily.  Otherwise, no change in medications  6 month follow up

## 2021-04-09 ENCOUNTER — Telehealth: Payer: Self-pay

## 2021-04-09 NOTE — Telephone Encounter (Signed)
Thank you :)

## 2021-04-09 NOTE — Telephone Encounter (Signed)
Pull PICC orders given to [facility] with [facility] per Dr. Keturah Barre 03/26/21 office note.   Beryle Flock, RN

## 2021-04-10 ENCOUNTER — Telehealth: Payer: Self-pay

## 2021-04-10 NOTE — Telephone Encounter (Signed)
I spoke with the patient advising him to go the ED to have his insertion site evaluated where the picc line was removed. Patient refused to go the the ED because of the wait time. Patient scheduled with Dr. West Bali tomorrow morning to have the site evaluated. Patient advised if any fevers or worsening of the site he needs to go the ED. Patient verbalized understanding.  Ruben Barrera T Brooks Sailors

## 2021-04-10 NOTE — Telephone Encounter (Signed)
-----   Message from Beryle Flock, RN sent at 04/10/2021  3:47 PM EST ----- Regarding: FW: Can you call and tell him he needs to go to ED? Thank you! ----- Message ----- From: Laurice Record, MD Sent: 04/10/2021   3:44 PM EST To: Beryle Flock, RN Subject: RE:                                            Would send to ed to get evaluated ----- Message ----- From: Beryle Flock, RN Sent: 04/10/2021   3:37 PM EST To: Laurice Record, MD  Dr. Candiss Norse, please see the following message from Carolynn Sayers with [facility]:   "Hi Team.  Reaching back out on our patient [name]. I just had a call from Sharyon Cable, RN with [facility] providing care for pt. She removed the PICC today but states the site looks very angry with a slight yellow discharge at the insertion site. Pt does not have ID appt until February. Let us know if you would like any further orders or instructions for the RN to give to the patient. I have added Adela Lank with [facility] to follow to help pass on orders to the RN if needed.  Thank you.   Pam"   Let me know what you need [place] to do, thanks!  Jinny Blossom

## 2021-04-11 ENCOUNTER — Other Ambulatory Visit: Payer: Self-pay

## 2021-04-11 ENCOUNTER — Ambulatory Visit: Payer: PPO | Admitting: Infectious Diseases

## 2021-04-11 ENCOUNTER — Encounter: Payer: Self-pay | Admitting: Infectious Diseases

## 2021-04-11 VITALS — BP 134/87 | HR 72 | Temp 97.9°F | Wt 177.0 lb

## 2021-04-11 DIAGNOSIS — I10 Essential (primary) hypertension: Secondary | ICD-10-CM | POA: Diagnosis not present

## 2021-04-11 DIAGNOSIS — T80219A Unspecified infection due to central venous catheter, initial encounter: Secondary | ICD-10-CM | POA: Diagnosis not present

## 2021-04-11 DIAGNOSIS — I339 Acute and subacute endocarditis, unspecified: Secondary | ICD-10-CM | POA: Diagnosis not present

## 2021-04-11 DIAGNOSIS — F330 Major depressive disorder, recurrent, mild: Secondary | ICD-10-CM | POA: Diagnosis not present

## 2021-04-11 DIAGNOSIS — I5022 Chronic systolic (congestive) heart failure: Secondary | ICD-10-CM | POA: Diagnosis not present

## 2021-04-11 DIAGNOSIS — L03113 Cellulitis of right upper limb: Secondary | ICD-10-CM

## 2021-04-11 DIAGNOSIS — E7800 Pure hypercholesterolemia, unspecified: Secondary | ICD-10-CM | POA: Diagnosis not present

## 2021-04-11 DIAGNOSIS — Z5181 Encounter for therapeutic drug level monitoring: Secondary | ICD-10-CM

## 2021-04-11 MED ORDER — DOXYCYCLINE HYCLATE 100 MG PO TABS: 100.0000 mg | ORAL_TABLET | Freq: Two times a day (BID) | ORAL | 0 refills | Status: DC

## 2021-04-11 NOTE — Progress Notes (Addendum)
Patient Active Problem List   Diagnosis Date Noted   Bacteremia due to Streptococcus 02/28/2021   Acute on chronic combined systolic and diastolic CHF (congestive heart failure) ([place]) 02/26/2021   RLS (restless legs syndrome) 02/26/2021   AKI (acute kidney injury) ([place]) 02/26/2021   Acute hypoxemic respiratory failure ([place]) 02/24/2021   S/P insertion of spinal cord stimulator 07/10/2020   Frequent PVCs 04/15/2020   Preop cardiovascular exam 04/15/2020   HFrEF (heart failure with reduced ejection fraction) ([place]) 04/15/2020   SVT (supraventricular tachycardia) ([place]) 04/15/2020   Coronary artery disease involving native coronary artery of native heart without angina pectoris 04/15/2020   Moderate to severe aortic stenosis 03/19/2020   Dyslipidemia    Chronic systolic congestive heart failure (HCC)    Acute ischemic cerebrovascular accident (CVA) involving anterior cerebral artery territory ([place]) 02/22/2020   Mixed hyperlipidemia    Tobacco abuse    Depression    Acute stroke due to occlusion of right cerebellar artery ([place]) 02/20/2020   Erythrocytosis 02/20/2020   BPH (benign prostatic hyperplasia) 02/20/2020   HNP (herniated nucleus pulposus), lumbar 01/13/2016   Lumbar stenosis with neurogenic claudication 08/28/2015   DOE (dyspnea on exertion) 05/01/2014   Essential hypertension 05/01/2014   Left shoulder pain 05/05/2011   Current Outpatient Medications on File Prior to Visit  Medication Sig Dispense Refill   Baclofen 5 MG TABS Take 5 mg by mouth 3 (three) times daily as needed. 90 tablet 0   celecoxib (CELEBREX) 200 MG capsule Take 200 mg by mouth daily.     citalopram (CELEXA) 20 MG tablet Take 20 mg by mouth daily.     clopidogrel (PLAVIX) 75 MG tablet Take 1 tablet (75 mg total) by mouth daily. 30 tablet 5   ezetimibe (ZETIA) 10 MG tablet Take 1 tablet (10 mg total) by mouth every evening. 30 tablet 0   fluticasone (FLONASE) 50 MCG/ACT nasal spray Place 1 spray  into both nostrils daily as needed for allergies.      furosemide (LASIX) 20 MG tablet Take 1 tablet (20 mg total) by mouth daily. 90 tablet 3   gabapentin (NEURONTIN) 400 MG capsule Take 400 mg by mouth at bedtime as needed (pain).     gabapentin (NEURONTIN) 600 MG tablet Take 1 tablet (600 mg total) by mouth 3 (three) times daily. 90 tablet 0   HYDROcodone-acetaminophen (NORCO) 7.5-325 MG tablet Take 1 tablet by mouth every 8 (eight) hours as needed for moderate pain. 20 tablet 0   LORazepam (ATIVAN) 1 MG tablet Take 1 tablet (1 mg total) by mouth every 8 (eight) hours as needed for anxiety. 10 tablet 0   Magnesium 250 MG TABS Take 250 mg by mouth daily in the afternoon.     metoprolol succinate (TOPROL-XL) 25 MG 24 hr tablet Take 25 mg by mouth daily.     MULTIPLE VITAMIN PO Take 1 tablet by mouth daily.      omeprazole (PRILOSEC) 20 MG capsule Take 20 mg by mouth daily.     pravastatin (PRAVACHOL) 20 MG tablet Take 1 tablet (20 mg total) by mouth daily at 6 PM. 30 tablet 0   saccharomyces boulardii (FLORASTOR) 250 MG capsule Take 250 mg by mouth daily in the afternoon.     senna-docusate (SENOKOT-S) 8.6-50 MG tablet Take 1 tablet by mouth 2 (two) times daily. (Patient taking differently: Take 2 tablets by mouth at bedtime.)     tamsulosin (FLOMAX) 0.4 MG CAPS  capsule Take 1 capsule (0.4 mg total) by mouth daily. (Patient taking differently: Take 0.4 mg by mouth daily after supper.) 30 capsule 0   triamcinolone (KENALOG) 0.1 % Apply 1 application topically daily as needed (irritation).     amoxicillin (AMOXIL) 500 MG capsule SMARTSIG:4 Capsule(s) By Mouth     No current facility-administered medications on file prior to visit.   Subjective: 88 YO male with PMH of CAD, Depression, , GERD, HLD, HTN, Lumbar spinal stenosis with spinal stimulator RLS, Bilateral TKR, CVA, RT 2nd toe amputation who was recently admitted in the hospital in December for Strep intermedius bacteremia/endocarditis.  Patient was discharged on IV penicillin for 6 weeks through rt arm PICC. IV abtx was completed on 12/19 after which PICC was removed on home health.   Patient accompanied by wife. Tells when bandage was pulled -  there was small redness, with tiny bit of pus that was wiped off. Denies fevers, chills and swelling. Denies arm swelling/tenderness or cord palpation. No concerns otherwise.   Review of Systems: ROS Negative for fevers, chills  Negative for nausea, vomiting and diarrhea  All other systems reviewed and negative except as above   Past Medical History:  Diagnosis Date   Arthritis    Coronary artery disease    Depression    Dysrhythmia    "extra heart beat"   GERD (gastroesophageal reflux disease)    History of hiatal hernia    Hypercholesteremia    Hypertension    Lumbar spinal stenosis    RLS (restless legs syndrome)    takes ativan as needed   Stroke ([place]) 01/2020   minor - no deficits   Past Surgical History:  Procedure Laterality Date   BACK SURGERY     COLONOSCOPY     EYE SURGERY Bilateral    cataracts removed    LUMBAR LAMINECTOMY/DECOMPRESSION MICRODISCECTOMY N/A 08/28/2015   Procedure: Lumbar Four-Five decompressive lumbar laminectomy;  Surgeon: Jovita Gamma, MD;  Location: [facility];  Service: Neurosurgery;  Laterality: N/A;   REPLACEMENT TOTAL KNEE BILATERAL Bilateral 11/05/2003   RIGHT/LEFT HEART CATH AND CORONARY ANGIOGRAPHY N/A 03/28/2020   Procedure: RIGHT/LEFT HEART CATH AND CORONARY ANGIOGRAPHY;  Surgeon: Burnell Blanks, MD;  Location: [facility];  Service: Cardiovascular;  Laterality: N/A;   TEE WITHOUT CARDIOVERSION N/A 02/28/2021   Procedure: TRANSESOPHAGEAL ECHOCARDIOGRAM (TEE);  Surgeon: Freada Bergeron, MD;  Location: [facility];  Service: Cardiovascular;  Laterality: N/A;   THORACIC DISCECTOMY N/A 07/10/2020   Procedure: Spinal cord stimulator via  - Thoracic Eight-Thoracic Nine, Thoracic Nine-Thoracic Ten Laminectomy;   Surgeon: Eustace Moore, MD;  Location: [facility];  Service: Neurosurgery;  Laterality: N/A;  3C   TOE AMPUTATION Right 2010   second toe   TONSILLECTOMY      Social History   Tobacco Use   Smoking status: Some Days    Types: Cigars   Smokeless tobacco: Never   Tobacco comments:    quit 1972- currently 1 cigar per week  Vaping Use   Vaping Use: Never used  Substance Use Topics   Alcohol use: Yes    Comment: occasionally beer/wine or scotch   Drug use: No    Comment: 1 cigar per week    Family History  Problem Relation Age of Onset   Stroke Mother    Hypertension Other        family history    Allergies  Allergen Reactions   Simvastatin Other (See Comments)    Leg  pains    Atorvastatin Other (See Comments)    Muscle aches   Benazepril Hcl Other (See Comments)   Losartan Other (See Comments)   Pravastatin Other (See Comments)   Rosuvastatin Other (See Comments)   Spironolactone Other (See Comments)    Health Maintenance  Topic Date Due   Pneumonia Vaccine 85+ Years old (1 - PCV) Never done   Zoster Vaccines- Shingrix (1 of 2) 09/28/1951   COVID-19 Vaccine (4 - Booster) 07/19/2019   TETANUS/TDAP  05/17/2022   INFLUENZA VACCINE  Completed   HPV VACCINES  Aged Out    Objective:  Vitals:   04/11/21 0932  BP: 134/87  Pulse: 72  Temp: 97.9 F (36.6 C)  TempSrc: Oral  SpO2: 95%  Weight: 177 lb (80.3 kg)   Body mass index is 26.91 kg/m.  Physical Exam Constitutional:      Appearance: Normal appearance.  HENT:     Head: Normocephalic and atraumatic.      Mouth: Mucous membranes are moist.  Eyes:    Conjunctiva/sclera: Conjunctivae normal.     Pupils: Pupils are equal, round  Cardiovascular:     Rate and Rhythm: Normal rate and regular rhythm.     Heart sounds:   Pulmonary:     Effort: Pulmonary effort is normal.     Breath sounds: Normal breath sounds.   Abdominal:     General:     Palpations: Abdomen is soft.   Musculoskeletal:         General: Normal range of motion.   Skin: Left arm - No cord palpable. No swelling/tenderness. Neurovascular status intact     Neurological:     General:     Mental Status: awake, alert and oriented to person, place, and time.   Psychiatric:        Mood and Affect: Mood normal.   Lab Results Lab Results  Component Value Date   WBC 11.6 (H) 02/27/2021   HGB 13.7 02/27/2021   HCT 40.8 02/27/2021   MCV 96.0 02/27/2021   PLT 193 02/27/2021    Lab Results  Component Value Date   CREATININE 1.23 02/27/2021   BUN 25 (H) 02/27/2021   NA 134 (L) 02/27/2021   K 3.7 02/27/2021   CL 100 02/27/2021   CO2 24 02/27/2021    Lab Results  Component Value Date   ALT 21 02/24/2021   AST 28 02/24/2021   ALKPHOS 71 02/24/2021   BILITOT 1.5 (H) 02/24/2021    Lab Results  Component Value Date   CHOL 204 (H) 02/21/2020   HDL 42 02/21/2020   LDLCALC 122 (H) 02/21/2020   TRIG 199 (H) 02/21/2020   CHOLHDL 4.9 02/21/2020   No results found for: LABRPR, RPRTITER No results found for: HIV1RNAQUANT, HIV1RNAVL, CD4TABS  Pertinent Imaging 02/28/21  IMPRESSIONS   1. There are small, fibrinous, independently mobile densities visualized  on both the anterior and posterior mitral valve leaflets. They appear less  echobright than the mitral valve leaflets which favors mitral valve  vegetations (infective endocarditis)  over degenerative valve changes. There is mild functional mitral  regurgitation without evidence of valve destruction.   2. Left ventricular ejection fraction, by estimation, is 30 to 35%. The  left ventricle has moderately decreased function. The left ventricular  internal cavity size was mildly dilated.   3. Right ventricular systolic function is normal. The right ventricular  size is normal.   4. Left atrial size was severely dilated. No left atrial/left atrial  appendage thrombus was detected.   5. The aortic valve is tricuspid. There is severe calcifcation of the   aortic valve. There is severe thickening of the aortic valve. Aortic valve  regurgitation is trivial. There is moderate aortic stenosis with AVA  1.3cm2 by continuity, mean gradient  50mmHg, Vmax 70m/s, DI 0.33. Gradients likely underestimated on current  study due to suboptimal doppler interrogation.   Conclusion(s)/Recommendation(s): Findings are concerning for  vegetation/infective endocarditis as detailed above.   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 Professional time spent includes the following activities: Preparing to see the patient (review of tests), Obtaining and/or reviewing separately obtained history (admission/discharge record), Performing a medically appropriate examination and/or evaluation , Ordering medications/tests/procedures, referring and communicating with other health care professionals, Documenting clinical information in the EMR, Independently interpreting results (not separately reported), Communicating results to the patient/family/caregiver, Counseling and educating the patient/family/caregiver and Care coordination (not separately reported).   Wilber Oliphant, [facility] 04/11/2021, 9:48 AM

## 2021-04-12 DIAGNOSIS — I339 Acute and subacute endocarditis, unspecified: Secondary | ICD-10-CM | POA: Insufficient documentation

## 2021-04-12 DIAGNOSIS — L03113 Cellulitis of right upper limb: Secondary | ICD-10-CM | POA: Insufficient documentation

## 2021-04-12 DIAGNOSIS — Z5181 Encounter for therapeutic drug level monitoring: Secondary | ICD-10-CM | POA: Insufficient documentation

## 2021-04-12 DIAGNOSIS — T80219A Unspecified infection due to central venous catheter, initial encounter: Secondary | ICD-10-CM | POA: Insufficient documentation

## 2021-04-12 NOTE — Addendum Note (Signed)
Addended by: Rosiland Oz on: 04/12/2021 08:29 AM   Modules accepted: Orders

## 2021-04-14 DIAGNOSIS — M79672 Pain in left foot: Secondary | ICD-10-CM | POA: Diagnosis not present

## 2021-04-14 DIAGNOSIS — I739 Peripheral vascular disease, unspecified: Secondary | ICD-10-CM | POA: Diagnosis not present

## 2021-04-14 DIAGNOSIS — M79671 Pain in right foot: Secondary | ICD-10-CM | POA: Diagnosis not present

## 2021-04-14 DIAGNOSIS — L603 Nail dystrophy: Secondary | ICD-10-CM | POA: Diagnosis not present

## 2021-04-17 NOTE — Progress Notes (Signed)
Cardiology Office Note:    Date:  04/18/2021   ID:  Ruben Barrera, DOB [birth year], MRN [id number]  PCP:  Lajean Manes, MD  [facility] Cardiologist:  Werner Lean, MD  [place] Electrophysiologist:  None   CC: Follow up mitral valve endcarditis.  History of Present Illness:    Ruben Barrera is a 88 y.o. male with a hx of HTN, HLD, Non-obstructive CAD without intervention, HFrEF,  LFLG-AS, right CVA stroke, frequent PVCs and SVT, and Tobacco Abuse (occasional cigar) who presented for evaluation 04/19/20.  In interval had LHC 03/28/20 with diagnosis of CAD and AS.   In interim of this visit, patient EP eval- no plans for more aggressive NSVT therapy; per EP and given age no plans for ICD.  Had back surgery 07/10/20.  I had also discuss his MRA with Dr. Felipa Eth (we DC/ed this medication).  Since last visit he has had echo showing that is again consistent with Moderate to Severe LFLG AS.  He has had interval 12/22 admission and eval for infective endocarditis (saw Dr. Acie Fredrickson, Johney Frame, and Ranger in interim).  Patient notes that he is doing poorly.  He is still recovering from sepsis.  He has symptomatic bradycardia that required him to stop his AB nodal agent. Is still recovering from sepsis and has leg weakness. Despite this can still bike for 30 minutes.  No chest pain or pressure.  No SOB/DOE and no PND/Orthopnea. Notes that he is urinating frequently.  Would like to trial lasix 20 mg q48 hours.  I reviewed red flag symptoms with him and his fluid intake.   Past Medical History:  Diagnosis Date   Arthritis    Coronary artery disease    Depression    Dysrhythmia    "extra heart beat"   GERD (gastroesophageal reflux disease)    History of hiatal hernia    Hypercholesteremia    Hypertension    Lumbar spinal stenosis    RLS (restless legs syndrome)    takes ativan as needed   Stroke ([place]) 01/2020   minor - no deficits    Past Surgical History:   Procedure Laterality Date   BACK SURGERY     COLONOSCOPY     EYE SURGERY Bilateral    cataracts removed    LUMBAR LAMINECTOMY/DECOMPRESSION MICRODISCECTOMY N/A 08/28/2015   Procedure: Lumbar Four-Five decompressive lumbar laminectomy;  Surgeon: Jovita Gamma, MD;  Location: [place] [facility];  Service: Neurosurgery;  Laterality: N/A;   REPLACEMENT TOTAL KNEE BILATERAL Bilateral 11/05/2003   RIGHT/LEFT HEART CATH AND CORONARY ANGIOGRAPHY N/A 03/28/2020   Procedure: RIGHT/LEFT HEART CATH AND CORONARY ANGIOGRAPHY;  Surgeon: Burnell Blanks, MD;  Location: [place] [facility];  Service: Cardiovascular;  Laterality: N/A;   TEE WITHOUT CARDIOVERSION N/A 02/28/2021   Procedure: TRANSESOPHAGEAL ECHOCARDIOGRAM (TEE);  Surgeon: Freada Bergeron, MD;  Location: [place];  Service: Cardiovascular;  Laterality: N/A;   THORACIC DISCECTOMY N/A 07/10/2020   Procedure: Spinal cord stimulator via  - Thoracic Eight-Thoracic Nine, Thoracic Nine-Thoracic Ten Laminectomy;  Surgeon: Eustace Moore, MD;  Location: [facility] [place];  Service: Neurosurgery;  Laterality: N/A;  3C   TOE AMPUTATION Right 2010   second toe   TONSILLECTOMY      Current Medications: Current Meds  Medication Sig   amoxicillin (AMOXIL) 500 MG capsule SMARTSIG:4 Capsule(s) By Mouth   Baclofen 5 MG TABS Take 5 mg by mouth 3 (three) times daily as needed.   celecoxib (CELEBREX) 200 MG capsule Take 200  mg by mouth daily.   citalopram (CELEXA) 20 MG tablet Take 20 mg by mouth daily.   clopidogrel (PLAVIX) 75 MG tablet Take 1 tablet (75 mg total) by mouth daily.   doxycycline (VIBRA-TABS) 100 MG tablet Take 1 tablet (100 mg total) by mouth 2 (two) times daily.   ezetimibe (ZETIA) 10 MG tablet Take 1 tablet (10 mg total) by mouth every evening.   fluticasone (FLONASE) 50 MCG/ACT nasal spray Place 1 spray into both nostrils daily as needed for allergies.    furosemide (LASIX) 20 MG tablet Take 1 tablet (20 mg total) by mouth every other day.    gabapentin (NEURONTIN) 400 MG capsule Take 400 mg by mouth at bedtime as needed (pain).   gabapentin (NEURONTIN) 600 MG tablet Take 1 tablet (600 mg total) by mouth 3 (three) times daily.   HYDROcodone-acetaminophen (NORCO) 7.5-325 MG tablet Take 1 tablet by mouth every 8 (eight) hours as needed for moderate pain.   LORazepam (ATIVAN) 1 MG tablet Take 1 tablet (1 mg total) by mouth every 8 (eight) hours as needed for anxiety.   Magnesium 250 MG TABS Take 250 mg by mouth daily in the afternoon.   MULTIPLE VITAMIN PO Take 1 tablet by mouth daily.    omeprazole (PRILOSEC) 20 MG capsule Take 20 mg by mouth daily.   pravastatin (PRAVACHOL) 20 MG tablet Take 1 tablet (20 mg total) by mouth daily at 6 PM.   saccharomyces boulardii (FLORASTOR) 250 MG capsule Take 250 mg by mouth daily in the afternoon.   senna-docusate (SENOKOT-S) 8.6-50 MG tablet Take 1 tablet by mouth 2 (two) times daily.   tamsulosin (FLOMAX) 0.4 MG CAPS capsule Take 1 capsule (0.4 mg total) by mouth daily.   traZODone (DESYREL) 50 MG tablet Take 25 mg by mouth as needed.   triamcinolone (KENALOG) 0.1 % Apply 1 application topically daily as needed (irritation).   [DISCONTINUED] furosemide (LASIX) 20 MG tablet Take 1 tablet (20 mg total) by mouth daily.     Allergies:   Simvastatin, Atorvastatin, Benazepril hcl, Losartan, Pravastatin, Rosuvastatin, and Spironolactone   Social History   Socioeconomic History   Marital status: Married    Spouse name: Not on file   Number of children: Not on file   Years of education: Not on file   Highest education level: Not on file  Occupational History   Not on file  Tobacco Use   Smoking status: Some Days    Types: Cigars   Smokeless tobacco: Never   Tobacco comments:    quit 1972- currently 1 cigar per week  Vaping Use   Vaping Use: Never used  Substance and Sexual Activity   Alcohol use: Yes    Comment: occasionally beer/wine or scotch   Drug use: No    Comment: 1 cigar per  week   Sexual activity: Not Currently  Other Topics Concern   Not on file  Social History Narrative   Right handed   Lives with wife one story home   Social Determinants of Health   Financial Resource Strain: Not on file  Food Insecurity: Not on file  Transportation Needs: Not on file  Physical Activity: Not on file  Stress: Not on file  Social Connections: Not on file    Social: Wife Comes to visit and takes notes for him, Former Teacher, English as a foreign language of [employer]; rin 2022 65th wedding anniversary  (2022), former high school quarterback  Family History: The patient's family history includes Hypertension in an other  family member; Stroke in his mother.  No history of congestive heart failure  ROS:   Please see the history of present illness.    All other systems reviewed and are negative.  EKGs/Labs/Other Studies Reviewed:    The following studies were reviewed today:  EKG:   02/21/20:  Sinus Rhythm Rate 84 with Rare PVCs, and PACs, QTc 514 ms  Cardiac Event Monitoring: Date 03/25/2020 Personally reviewed Results:   Patient had a minimum heart rate of 44 bpm, maximum heart rate of 171 bpm, and average heart rate of 68 bpm. Predominant underlying rhythm was sinus rhythm. Three runs of ventricular tachycardia occurred lasting 11 beats at longest with a max rate of 143 bpm. Eighty-seven runs of supraventricular tachycardia occurred, lasting 17.1 seconds at longest with a max rate of 171 bpm at fastest. Isolated PACs were occasional (4.2%), with rare couplets and triplets present. Isolated PVCs were frequent (5.1%), with rare couplets, triplets, bigeminy, and trigeminy present. No evidence of complete heart block. No triggered and diary events.   Frequent PVCs, SVT, with runs of NSVT.   Transthoracic Echocardiogram: Date:11/21/20 Results: 1. Left ventricular ejection fraction, by estimation, is 40 to 45%. The  left ventricle has mildly decreased function. The left ventricle   demonstrates regional wall motion abnormalities (see scoring  diagram/findings for description). The left ventricular   internal cavity size was moderately dilated. Left ventricular diastolic  parameters are consistent with Grade I diastolic dysfunction (impaired  relaxation).   2. Right ventricular systolic function is mildly reduced. The right  ventricular size is mildly enlarged.   3. Left atrial size was severely dilated.   4. The mitral valve is normal in structure. Mild mitral valve  regurgitation. No evidence of mitral stenosis.   5. The aortic valve has an indeterminant number of cusps. There is severe  calcifcation of the aortic valve. There is severe thickening of the aortic  valve. Aortic valve regurgitation is not visualized. Severe aortic valve  stenosis. Aortic valve area, by   VTI measures 0.76 cm. Aortic valve mean gradient measures 23.0 mmHg.  Aortic valve Vmax measures 3.21 m/s. Mean AVG and peak velocity are  consistent with moderate AS but the SVI is low at 30 and the dimensionless  index is low at 0.22. Suspect this  respresents low flow low gradient severe AS in setting of reduced EF.   6. The inferior vena cava is normal in size with greater than 50%  respiratory variability, suggesting right atrial pressure of 3 mmHg.   7. Compared to study of 02/2020, the mean transaortic gradient has  decreased from 25 to 51mmHg but the dimensionless index has decreased from  0.28 to 0.22 and SVI has decreased from 40 to 30. LVF appears improved  from prior study.   FINDINGS   Left Ventricle: LVMI 197 g/m2 RWT 0.54. Left ventricular ejection fraction, by estimation, is 30 to 35%.  The left ventricle has moderately decreased function. The left ventricle  demonstrates global hypokinesis. The left ventricular internal cavity size  was mildly dilated. There is severe concentric left ventricular hypertrophy. Left ventricular diastolic parameters are indeterminate.   NM Stress  Testing : Date: 02/21/2020 Results:  Notes Recorded by Dorothy Spark, MD on 05/10/2014 at 2:50 PM Normal stress test  Left/Right Heart Catheterizations: Date: 03/28/2020 Results: Prox Cx lesion is 30% stenosed. Prox LAD to Mid LAD lesion is 60% stenosed. Dist LAD lesion is 60% stenosed.   1. The LAD is a large vessel  that courses to the apex. Moderate, heavily calcified proximal to mid stenosis that does is eccentric and does not appear to flow limiting. Focal mid to distal moderate stenosis.  2. Moderate disease in the intermediate branch 3. Mild plaque in the Circumflex 4. Large, dominant RCA with no obstructive disease 5. Low flow/low gradient moderate to severe aortic stenosis (Cath data: Mean gradient 16.105mmHg, peak to peak gradient 16 mmHg). By echo his dimensionless index is 0.28 and SVI is 40.    Recommendations: He appears to have low flow/low gradient moderate to severe aortic stenosis. He has moderate CAD but in the absence of angina, would not treat the calcific disease in the proximal and mid LAD. He is asymptomatic at this time.    Recent Labs: 02/24/2021: ALT 21; B Natriuretic Peptide 630.4 02/26/2021: Magnesium 2.3 02/27/2021: BUN 25; Creatinine, Ser 1.23; Hemoglobin 13.7; Platelets 193; Potassium 3.7; Sodium 134  Recent Lipid Panel    Component Value Date/Time   CHOL 204 (H) 02/21/2020 0253   TRIG 199 (H) 02/21/2020 0253   HDL 42 02/21/2020 0253   CHOLHDL 4.9 02/21/2020 0253   VLDL 40 02/21/2020 0253   LDLCALC 122 (H) 02/21/2020 0253     Physical Exam:    VS:  BP 135/80    Pulse 79    Ht 5\' 8"  (1.727 m)    Wt 82.1 kg    SpO2 97%    BMI 27.52 kg/m     Wt Readings from Last 3 Encounters:  04/18/21 82.1 kg  04/11/21 80.3 kg  04/08/21 81.6 kg    GEN: Elderly Male HEENT: Normal NECK: No JVD CARDIAC: RRR, III/VI systolic crescendo murmur without rubs or gallops, late S2, IRIR beats RESPIRATORY:  Clear to auscultation without rales, wheezing or rhonchi   ABDOMEN: Soft, non-tender, non-distended MUSCULOSKELETAL:  No edema; No deformity  SKIN: Warm and dry NEUROLOGIC:  Alert and oriented x 3 PSYCHIATRIC:  Normal affect   ASSESSMENT:    1. Frequent PVCs   2. HFrEF (heart failure with reduced ejection fraction) ([place])   3. Coronary artery disease involving native coronary artery of native heart without angina pectoris      PLAN:    Infective endocarditis of the mitral valve: - TEE in follow up (consented) - will eval mitral valve and 3d LVEF   Heart Failure Reduced Ejection Fraction  Low Flow Low Gradient  moderate to severe aortic stenosis - NYHA class I, Stage B, euvolemic, etiology likely multifocal; CAD may be a component, also had frequent PVCs  (historically) - Diuretic regimen: lasix 20 mg PO trial of   - Strict I/Os, daily weights, and fluid restriction of < 2 L  - unable to tolerate BB for symptomatic bradycardia - ARB related symptomatic hypotension stopped; no plans to ARNI challenge - MRA stopped by PCP in the past - After TEE will price check [email or web address] for potential start  We have discussed that we have limited goal directed medical therapy interventions due to hypotension and bradycardia (ARB, BB, MRA).  LVEF continues to worsen. - will cc Drs. Lovena Le and Bridger; I am not sure that TAVR or CRT-P then BB would be enough given him LV recovery, but will attempt to explore all options  Coronary Artery Disease; Nonobstructive HLD - asymptomatic - anatomy: Prox LAD 60%, mid LAD 60%, prox Cir 30% - continue ASA 81 mg -goal LDL < 70 (multiple statin intolerances in the past) -continue Zetia and pravastatin - gave education on dietary changes  NSVT  and Frequent PVCs - seen by  EP  Tobacco Abuse-> - Down to two cigars a week (Pre-Contemplative for further cessation)  Will schedule follow up after his echo  Time Spent Directly with Patient:   I have spent a total of 40 minutes with the patient reviewing notes,  imaging, EKGs, labs and examining the patient as well as establishing an assessment and plan that was discussed personally with the patient.  > 50% of time was spent in direct patient care and family and reviewing imaging with patient.    Medication Adjustments/Labs and Tests Ordered: Current medicines are reviewed at length with the patient today.  Concerns regarding medicines are outlined above.  No orders of the defined types were placed in this encounter.   Meds ordered this encounter  Medications   furosemide (LASIX) 20 MG tablet    Sig: Take 1 tablet (20 mg total) by mouth every other day.    Dispense:  45 tablet    Refill:  3     Patient Instructions  Medication Instructions:  Your physician has recommended you make the following change in your medication:  TAKE: furosemide (lasix) 20 mg by mouth every other day.  *If you need a refill on your cardiac medications before your next appointment, please call your pharmacy*   Lab Work: NONE If you have labs (blood work) drawn today and your tests are completely normal, you will receive your results only by: [place] (if you have MyChart) OR A paper copy in the mail If you have any lab test that is abnormal or we need to change your treatment, we will call you to review the results.   Testing/Procedures: Your physician has requested that you have a TEE. During a TEE, sound waves are used to create images of your heart. It provides your doctor with information about the size and shape of your heart and how well your hearts chambers and valves are working. In this test, a transducer is attached to the end of a flexible tube thats guided down your throat and into your esophagus (the tube leading from you mouth to your stomach) to get a more detailed image of your heart. You are not awake for the procedure. Please see the instruction sheet given to you today. For further information please visit [email or web address].      Follow-Up: to be determined by Dr. Gasper Sells  At [facility], you and your health needs are our priority.  As part of our continuing mission to provide you with exceptional heart care, we have created designated Provider Care Teams.  These Care Teams include your primary Cardiologist (physician) and Advanced Practice Providers (APPs -  Physician Assistants and Nurse Practitioners) who all work together to provide you with the care you need, when you need it.      Provider:   Werner Lean, MD     Other Instructions   You are scheduled for a TEE on Wednesday May 28, 2021 with Dr. Gasper Sells.  Please arrive at the [facility] (Main Entrance A) at [facility]: [address] [place], [place] [address] at 7:30 am.   DIET: Nothing to eat or drink after midnight except a sip of water with medications (see medication instructions below)  FYI: For your safety, and to allow [place] to monitor your vital signs accurately during the surgery/procedure we request that   if you have artificial nails, gel coating, SNS etc. Please have those removed prior to your surgery/procedure. Not  having the nail coverings /polish removed may result in cancellation or delay of your surgery/procedure.   Medication Instructions: Hold Furosemide (Lasix) on the day of TEE   Labs:   Your lab work will be done at the hospital prior to your procedure - you will need to arrive (at 7:30 am)1  hours ahead of your procedure  You must have a responsible person to drive you home and stay in the waiting area during your procedure. Failure to do so could result in cancellation.  Bring your insurance cards.  *Special Note: Every effort is made to have your procedure done on time. Occasionally there are emergencies that occur at the hospital that may cause delays. Please be patient if a delay does occur.      Signed, Werner Lean, MD  04/18/2021 12:54 PM    [place]

## 2021-04-18 ENCOUNTER — Telehealth (INDEPENDENT_AMBULATORY_CARE_PROVIDER_SITE_OTHER): Payer: PPO | Admitting: Infectious Diseases

## 2021-04-18 ENCOUNTER — Encounter: Payer: Self-pay | Admitting: Internal Medicine

## 2021-04-18 ENCOUNTER — Ambulatory Visit: Payer: PPO | Admitting: Internal Medicine

## 2021-04-18 ENCOUNTER — Other Ambulatory Visit: Payer: Self-pay

## 2021-04-18 VITALS — BP 135/80 | HR 79 | Ht 68.0 in | Wt 181.0 lb

## 2021-04-18 DIAGNOSIS — I493 Ventricular premature depolarization: Secondary | ICD-10-CM

## 2021-04-18 DIAGNOSIS — Z5181 Encounter for therapeutic drug level monitoring: Secondary | ICD-10-CM

## 2021-04-18 DIAGNOSIS — L03113 Cellulitis of right upper limb: Secondary | ICD-10-CM

## 2021-04-18 DIAGNOSIS — I5020 Unspecified systolic (congestive) heart failure: Secondary | ICD-10-CM

## 2021-04-18 DIAGNOSIS — I2510 Atherosclerotic heart disease of native coronary artery without angina pectoris: Secondary | ICD-10-CM

## 2021-04-18 DIAGNOSIS — I339 Acute and subacute endocarditis, unspecified: Secondary | ICD-10-CM | POA: Diagnosis not present

## 2021-04-18 MED ORDER — FUROSEMIDE 20 MG PO TABS: 20.0000 mg | ORAL_TABLET | ORAL | 3 refills | Status: DC

## 2021-04-18 NOTE — Patient Instructions (Addendum)
Medication Instructions:  Your physician has recommended you make the following change in your medication:  TAKE: furosemide (lasix) 20 mg by mouth every other day.  *If you need a refill on your cardiac medications before your next appointment, please call your pharmacy*   Lab Work: NONE If you have labs (blood work) drawn today and your tests are completely normal, you will receive your results only by: [place] (if you have MyChart) OR A paper copy in the mail If you have any lab test that is abnormal or we need to change your treatment, we will call you to review the results.   Testing/Procedures: Your physician has requested that you have a TEE. During a TEE, sound waves are used to create images of your heart. It provides your doctor with information about the size and shape of your heart and how well your hearts chambers and valves are working. In this test, a transducer is attached to the end of a flexible tube thats guided down your throat and into your esophagus (the tube leading from you mouth to your stomach) to get a more detailed image of your heart. You are not awake for the procedure. Please see the instruction sheet given to you today. For further information please visit [email or web address].     Follow-Up: to be determined by Dr. Gasper Sells  At [facility], you and your health needs are our priority.  As part of our continuing mission to provide you with exceptional heart care, we have created designated Provider Care Teams.  These Care Teams include your primary Cardiologist (physician) and Advanced Practice Providers (APPs -  Physician Assistants and Nurse Practitioners) who all work together to provide you with the care you need, when you need it.      Provider:   Werner Lean, MD     Other Instructions   You are scheduled for a TEE on Wednesday May 28, 2021 with Dr. Gasper Sells.  Please arrive at the [facility] (Main Entrance A) at  [facility]: [address] [place], [place] [address] at 7:30 am.   DIET: Nothing to eat or drink after midnight except a sip of water with medications (see medication instructions below)  FYI: For your safety, and to allow [place] to monitor your vital signs accurately during the surgery/procedure we request that   if you have artificial nails, gel coating, SNS etc. Please have those removed prior to your surgery/procedure. Not having the nail coverings /polish removed may result in cancellation or delay of your surgery/procedure.   Medication Instructions: Hold Furosemide (Lasix) on the day of TEE   Labs:   Your lab work will be done at the hospital prior to your procedure - you will need to arrive (at 7:30 am)1  hours ahead of your procedure  You must have a responsible person to drive you home and stay in the waiting area during your procedure. Failure to do so could result in cancellation.  Bring your insurance cards.  *Special Note: Every effort is made to have your procedure done on time. Occasionally there are emergencies that occur at the hospital that may cause delays. Please be patient if a delay does occur.

## 2021-04-18 NOTE — Progress Notes (Addendum)
Virtual Visit via Telephone Note  I connected withNAME@ on 04/18/21 at  9:45 AM EST by a telephone enabled telemedicine application and verified that I am speaking with the correct person using two identifiers.  Location: Patient: Home  Provider: [facility]   I discussed the limitations of evaluation and management by telemedicine and the availability of in person appointments. The patient expressed understanding and agreed to proceed.  [facility]  Patient Active Problem List   Diagnosis Date Noted   Acute endocarditis 04/12/2021   Cellulitis of right upper extremity 04/12/2021   Infection of peripherally inserted central venous catheter (PICC) 04/12/2021   Medication monitoring encounter 04/12/2021   Bacteremia due to Streptococcus 02/28/2021   Acute on chronic combined systolic and diastolic CHF (congestive heart failure) ([place]) 02/26/2021   RLS (restless legs syndrome) 02/26/2021   AKI (acute kidney injury) ([place]) 02/26/2021   Acute hypoxemic respiratory failure ([place]) 02/24/2021   S/P insertion of spinal cord stimulator 07/10/2020   Frequent PVCs 04/15/2020   Preop cardiovascular exam 04/15/2020   HFrEF (heart failure with reduced ejection fraction) ([place]) 04/15/2020   SVT (supraventricular tachycardia) ([place]) 04/15/2020   Coronary artery disease involving native coronary artery of native heart without angina pectoris 04/15/2020   Moderate to severe aortic stenosis 03/19/2020   Dyslipidemia    Chronic systolic congestive heart failure (HCC)    Acute ischemic cerebrovascular accident (CVA) involving anterior cerebral artery territory ([place]) 02/22/2020   Mixed hyperlipidemia    Tobacco abuse    Depression    Acute stroke due to occlusion of right cerebellar artery ([place]) 02/20/2020   Erythrocytosis 02/20/2020   BPH (benign prostatic hyperplasia) 02/20/2020   HNP (herniated nucleus pulposus), lumbar 01/13/2016   Lumbar stenosis with neurogenic claudication  08/28/2015   DOE (dyspnea on exertion) 05/01/2014   Essential hypertension 05/01/2014   Left shoulder pain 05/05/2011    Patient's Medications  New Prescriptions   No medications on file  Previous Medications   AMOXICILLIN (AMOXIL) 500 MG CAPSULE    SMARTSIG:4 Capsule(s) By Mouth   BACLOFEN 5 MG TABS    Take 5 mg by mouth 3 (three) times daily as needed.   CELECOXIB (CELEBREX) 200 MG CAPSULE    Take 200 mg by mouth daily.   CITALOPRAM (CELEXA) 20 MG TABLET    Take 20 mg by mouth daily.   CLOPIDOGREL (PLAVIX) 75 MG TABLET    Take 1 tablet (75 mg total) by mouth daily.   DOXYCYCLINE (VIBRA-TABS) 100 MG TABLET    Take 1 tablet (100 mg total) by mouth 2 (two) times daily.   EZETIMIBE (ZETIA) 10 MG TABLET    Take 1 tablet (10 mg total) by mouth every evening.   FLUTICASONE (FLONASE) 50 MCG/ACT NASAL SPRAY    Place 1 spray into both nostrils daily as needed for allergies.    FUROSEMIDE (LASIX) 20 MG TABLET    Take 1 tablet (20 mg total) by mouth daily.   GABAPENTIN (NEURONTIN) 400 MG CAPSULE    Take 400 mg by mouth at bedtime as needed (pain).   GABAPENTIN (NEURONTIN) 600 MG TABLET    Take 1 tablet (600 mg total) by mouth 3 (three) times daily.   HYDROCODONE-ACETAMINOPHEN (NORCO) 7.5-325 MG TABLET    Take 1 tablet by mouth every 8 (eight) hours as needed for moderate pain.   LORAZEPAM (ATIVAN) 1 MG TABLET    Take 1 tablet (1 mg total) by mouth every 8 (eight) hours as needed for anxiety.  MAGNESIUM 250 MG TABS    Take 250 mg by mouth daily in the afternoon.   METOPROLOL SUCCINATE (TOPROL-XL) 25 MG 24 HR TABLET    Take 25 mg by mouth daily.   MULTIPLE VITAMIN PO    Take 1 tablet by mouth daily.    OMEPRAZOLE (PRILOSEC) 20 MG CAPSULE    Take 20 mg by mouth daily.   PRAVASTATIN (PRAVACHOL) 20 MG TABLET    Take 1 tablet (20 mg total) by mouth daily at 6 PM.   SACCHAROMYCES BOULARDII (FLORASTOR) 250 MG CAPSULE    Take 250 mg by mouth daily in the afternoon.   SENNA-DOCUSATE (SENOKOT-S) 8.6-50 MG  TABLET    Take 1 tablet by mouth 2 (two) times daily.   TAMSULOSIN (FLOMAX) 0.4 MG CAPS CAPSULE    Take 1 capsule (0.4 mg total) by mouth daily.   TRIAMCINOLONE (KENALOG) 0.1 %    Apply 1 application topically daily as needed (irritation).  Modified Medications   No medications on file  Discontinued Medications   No medications on file    History of Present Illness: This was supposed to be a video visit but had to be turned to a phone visit as patient was unable to join in video.   Here for follow up for possible cellulitis at site of PICC line removal in the rt arm Patient says redness has improved, has a scab in the center. No swelling. Taking Doxycycline as prescribed without missing doses. He feels generalized weakness in the legs and is working out upto 45 minutes a day.  Denies fevers, chills and sweats. I requested to him if he can sent a picture via mychart to which he tried and unsuccessful. Discussed with worsening symptoms and signs to come for in person evaluation. He already has a fu appt with Dr Candiss Norse on 05/06/20 and advised to keep that appt.   ROS Negative for fevers, chills Negative for nausea, vomiting and diarrhea All other systems reviewed and negative except as above  Past Medical History:  Diagnosis Date   Arthritis    Coronary artery disease    Depression    Dysrhythmia    "extra heart beat"   GERD (gastroesophageal reflux disease)    History of hiatal hernia    Hypercholesteremia    Hypertension    Lumbar spinal stenosis    RLS (restless legs syndrome)    takes ativan as needed   Stroke ([place]) 01/2020   minor - no deficits   Past Surgical History:  Procedure Laterality Date   BACK SURGERY     COLONOSCOPY     EYE SURGERY Bilateral    cataracts removed    LUMBAR LAMINECTOMY/DECOMPRESSION MICRODISCECTOMY N/A 08/28/2015   Procedure: Lumbar Four-Five decompressive lumbar laminectomy;  Surgeon: Jovita Gamma, MD;  Location: [place] [facility];  Service:  Neurosurgery;  Laterality: N/A;   REPLACEMENT TOTAL KNEE BILATERAL Bilateral 11/05/2003   RIGHT/LEFT HEART CATH AND CORONARY ANGIOGRAPHY N/A 03/28/2020   Procedure: RIGHT/LEFT HEART CATH AND CORONARY ANGIOGRAPHY;  Surgeon: Burnell Blanks, MD;  Location: [place];  Service: Cardiovascular;  Laterality: N/A;   TEE WITHOUT CARDIOVERSION N/A 02/28/2021   Procedure: TRANSESOPHAGEAL ECHOCARDIOGRAM (TEE);  Surgeon: Freada Bergeron, MD;  Location: [facility];  Service: Cardiovascular;  Laterality: N/A;   THORACIC DISCECTOMY N/A 07/10/2020   Procedure: Spinal cord stimulator via  - Thoracic Eight-Thoracic Nine, Thoracic Nine-Thoracic Ten Laminectomy;  Surgeon: Eustace Moore, MD;  Location: [facility];  Service: Neurosurgery;  Laterality: N/A;  3C   TOE AMPUTATION Right 2010   second toe   TONSILLECTOMY       Social History   Tobacco Use   Smoking status: Some Days    Types: Cigars   Smokeless tobacco: Never   Tobacco comments:    quit 1972- currently 1 cigar per week  Vaping Use   Vaping Use: Never used  Substance Use Topics   Alcohol use: Yes    Comment: occasionally beer/wine or scotch   Drug use: No    Comment: 1 cigar per week    Family History  Problem Relation Age of Onset   Stroke Mother    Hypertension Other        family history    Allergies  Allergen Reactions   Simvastatin Other (See Comments)    Leg pains    Atorvastatin Other (See Comments)    Muscle aches   Benazepril Hcl Other (See Comments)   Losartan Other (See Comments)   Pravastatin Other (See Comments)   Rosuvastatin Other (See Comments)   Spironolactone Other (See Comments)    Health Maintenance  Topic Date Due   Pneumonia Vaccine 75+ Years old (1 - PCV) Never done   Zoster Vaccines- Shingrix (1 of 2) 09/28/1951   COVID-19 Vaccine (4 - Booster) 07/19/2019   TETANUS/TDAP  05/17/2022   INFLUENZA VACCINE  Completed   HPV VACCINES  Aged Out    Observations/Objective: Somewhat  hard of hearing   Assessment and Plan: 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 The patient was provided an opportunity to ask questions and all were answered. The patient agreed with the plan and demonstrated an understanding of the instructions.   The patient was advised to call back or seek an in-person evaluation if the symptoms worsen or if the condition fails to improve as anticipated.  I provided 25  minutes of non-face-to-face time during this encounter.  Wilber Oliphant, [facility] Phone [phone] Fax no. [phone]  04/18/2021, 10:05 AM

## 2021-04-23 DIAGNOSIS — R269 Unspecified abnormalities of gait and mobility: Secondary | ICD-10-CM | POA: Diagnosis not present

## 2021-04-28 DIAGNOSIS — B356 Tinea cruris: Secondary | ICD-10-CM | POA: Diagnosis not present

## 2021-05-06 ENCOUNTER — Ambulatory Visit: Payer: PPO | Admitting: Internal Medicine

## 2021-05-16 ENCOUNTER — Encounter: Payer: Self-pay | Admitting: Infectious Diseases

## 2021-05-16 ENCOUNTER — Other Ambulatory Visit: Payer: Self-pay

## 2021-05-16 ENCOUNTER — Ambulatory Visit: Payer: PPO | Admitting: Infectious Diseases

## 2021-05-16 VITALS — BP 126/77 | HR 82 | Temp 97.8°F | Ht 69.0 in | Wt 179.0 lb

## 2021-05-16 DIAGNOSIS — I339 Acute and subacute endocarditis, unspecified: Secondary | ICD-10-CM | POA: Diagnosis not present

## 2021-05-16 DIAGNOSIS — I350 Nonrheumatic aortic (valve) stenosis: Secondary | ICD-10-CM | POA: Diagnosis not present

## 2021-05-16 DIAGNOSIS — L03113 Cellulitis of right upper limb: Secondary | ICD-10-CM | POA: Diagnosis not present

## 2021-05-16 NOTE — Progress Notes (Signed)
Patient Active Problem List   Diagnosis Date Noted   Acute endocarditis 04/12/2021   Cellulitis of right upper extremity 04/12/2021   Infection of peripherally inserted central venous catheter (PICC) 04/12/2021   Medication monitoring encounter 04/12/2021   Bacteremia due to Streptococcus 02/28/2021   Acute on chronic combined systolic and diastolic CHF (congestive heart failure) ([place]) 02/26/2021   RLS (restless legs syndrome) 02/26/2021   AKI (acute kidney injury) ([place]) 02/26/2021   Acute hypoxemic respiratory failure ([place]) 02/24/2021   S/P insertion of spinal cord stimulator 07/10/2020   Frequent PVCs 04/15/2020   Preop cardiovascular exam 04/15/2020   HFrEF (heart failure with reduced ejection fraction) ([place]) 04/15/2020   SVT (supraventricular tachycardia) ([place]) 04/15/2020   Coronary artery disease involving native coronary artery of native heart without angina pectoris 04/15/2020   Moderate to severe aortic stenosis 03/19/2020   Dyslipidemia    Chronic systolic congestive heart failure (HCC)    Acute ischemic cerebrovascular accident (CVA) involving anterior cerebral artery territory ([place]) 02/22/2020   Mixed hyperlipidemia    Tobacco abuse    Depression    Acute stroke due to occlusion of right cerebellar artery ([place]) 02/20/2020   Erythrocytosis 02/20/2020   BPH (benign prostatic hyperplasia) 02/20/2020   HNP (herniated nucleus pulposus), lumbar 01/13/2016   Lumbar stenosis with neurogenic claudication 08/28/2015   DOE (dyspnea on exertion) 05/01/2014   Essential hypertension 05/01/2014   Left shoulder pain 05/05/2011   Current Outpatient Medications on File Prior to Visit  Medication Sig Dispense Refill   amoxicillin (AMOXIL) 500 MG capsule SMARTSIG:4 Capsule(s) By Mouth     celecoxib (CELEBREX) 200 MG capsule Take 200 mg by mouth daily.     citalopram (CELEXA) 20 MG tablet Take 20 mg by mouth daily.     clopidogrel (PLAVIX) 75 MG tablet Take 1 tablet (75 mg  total) by mouth daily. 30 tablet 5   doxycycline (VIBRA-TABS) 100 MG tablet Take 1 tablet (100 mg total) by mouth 2 (two) times daily. 20 tablet 0   ezetimibe (ZETIA) 10 MG tablet Take 1 tablet (10 mg total) by mouth every evening. 30 tablet 0   fluticasone (FLONASE) 50 MCG/ACT nasal spray Place 1 spray into both nostrils daily as needed for allergies.      furosemide (LASIX) 20 MG tablet Take 1 tablet (20 mg total) by mouth every other day. 45 tablet 3   gabapentin (NEURONTIN) 600 MG tablet Take 1 tablet (600 mg total) by mouth 3 (three) times daily. 90 tablet 0   HYDROcodone-acetaminophen (NORCO) 7.5-325 MG tablet Take 1 tablet by mouth every 8 (eight) hours as needed for moderate pain. 20 tablet 0   Magnesium 250 MG TABS Take 250 mg by mouth daily in the afternoon.     MULTIPLE VITAMIN PO Take 1 tablet by mouth daily.      omeprazole (PRILOSEC) 20 MG capsule Take 20 mg by mouth daily.     pravastatin (PRAVACHOL) 20 MG tablet Take 1 tablet (20 mg total) by mouth daily at 6 PM. 30 tablet 0   saccharomyces boulardii (FLORASTOR) 250 MG capsule Take 250 mg by mouth daily in the afternoon.     senna-docusate (SENOKOT-S) 8.6-50 MG tablet Take 1 tablet by mouth 2 (two) times daily.     tamsulosin (FLOMAX) 0.4 MG CAPS capsule Take 1 capsule (0.4 mg total) by mouth daily. 30 capsule 0   triamcinolone (KENALOG) 0.1 % Apply 1 application topically daily as needed (irritation).  traZODone (DESYREL) 50 MG tablet Take 25 mg by mouth as needed.     No current facility-administered medications on file prior to visit.    Subjective: Here for follow up in the setting of strep intermedius MV endocarditis and cellulitis at the PICC line removal site. He has completed course of doxycycline and cellulitis in the rt arm has resolved. He has been following Cardiology and there is a plan for fu TEE. He has recently seen dentist in the beginning of February. Discussed about need for dental prophylaxis. He tells he  has been able to walk first time today by himself without any support.No concerns otherwise.    Review of Systems: ROS Denies fevers, chills, sweats Denies nausea, vomiting and diarrhea All systems reviewed and negative except stated as above  Past Medical History:  Diagnosis Date   Arthritis    Coronary artery disease    Depression    Dysrhythmia    "extra heart beat"   GERD (gastroesophageal reflux disease)    History of hiatal hernia    Hypercholesteremia    Hypertension    Lumbar spinal stenosis    RLS (restless legs syndrome)    takes ativan as needed   Stroke ([place]) 01/2020   minor - no deficits   Past Surgical History:  Procedure Laterality Date   BACK SURGERY     COLONOSCOPY     EYE SURGERY Bilateral    cataracts removed    LUMBAR LAMINECTOMY/DECOMPRESSION MICRODISCECTOMY N/A 08/28/2015   Procedure: Lumbar Four-Five decompressive lumbar laminectomy;  Surgeon: Jovita Gamma, MD;  Location: [place];  Service: Neurosurgery;  Laterality: N/A;   REPLACEMENT TOTAL KNEE BILATERAL Bilateral 11/05/2003   RIGHT/LEFT HEART CATH AND CORONARY ANGIOGRAPHY N/A 03/28/2020   Procedure: RIGHT/LEFT HEART CATH AND CORONARY ANGIOGRAPHY;  Surgeon: Burnell Blanks, MD;  Location: [place];  Service: Cardiovascular;  Laterality: N/A;   TEE WITHOUT CARDIOVERSION N/A 02/28/2021   Procedure: TRANSESOPHAGEAL ECHOCARDIOGRAM (TEE);  Surgeon: Freada Bergeron, MD;  Location: [facility];  Service: Cardiovascular;  Laterality: N/A;   THORACIC DISCECTOMY N/A 07/10/2020   Procedure: Spinal cord stimulator via  - Thoracic Eight-Thoracic Nine, Thoracic Nine-Thoracic Ten Laminectomy;  Surgeon: Eustace Moore, MD;  Location: [facility];  Service: Neurosurgery;  Laterality: N/A;  3C   TOE AMPUTATION Right 2010   second toe   TONSILLECTOMY      Social History   Tobacco Use   Smoking status: Some Days    Types: Cigars   Smokeless tobacco: Never   Tobacco comments:    quit 1972-  currently 1 cigar per week  Vaping Use   Vaping Use: Never used  Substance Use Topics   Alcohol use: Yes    Comment: occasionally beer/wine or scotch   Drug use: No    Comment: 1 cigar per week    Family History  Problem Relation Age of Onset   Stroke Mother    Hypertension Other        family history    Allergies  Allergen Reactions   Simvastatin Other (See Comments)    Leg pains    Atorvastatin Other (See Comments)    Muscle aches   Benazepril Hcl Other (See Comments)   Losartan Other (See Comments)   Pravastatin Other (See Comments)   Rosuvastatin Other (See Comments)   Spironolactone Other (See Comments)    Health Maintenance  Topic Date Due   Pneumonia Vaccine 43+ Years old (1 - PCV) Never done  Zoster Vaccines- Shingrix (1 of 2) 09/28/1951   COVID-19 Vaccine (4 - Booster) 07/19/2019   TETANUS/TDAP  05/17/2022   INFLUENZA VACCINE  Completed   HPV VACCINES  Aged Out    Objective: BP 126/77    Pulse 82    Temp 97.8 F (36.6 C) (Oral)    Ht 5\' 9"  (1.753 m)    Wt 179 lb (81.2 kg) Comment: stated   SpO2 96%    BMI 26.43 kg/m   Physical Exam Constitutional:      Appearance: Normal appearance.  HENT:     Head: Normocephalic and atraumatic.      Mouth: Mucous membranes are moist.  Eyes:    Conjunctiva/sclera: Conjunctivae normal.     Pupils: Pupils are equal, round  Cardiovascular:     Rate and Rhythm: Normal rate and regular rhythm.     Heart sounds:   Pulmonary:     Effort: Pulmonary effort is normal.     Breath sounds:   Abdominal:     General: Non distended     Palpations: soft.   Musculoskeletal:        General: Normal range of motion.   Skin:    General: Skin is warm and dry.     Comments:  Neurological:     General: grossly non focal     Mental Status: awake, alert and oriented to person, place, and time.   Psychiatric:        Mood and Affect: Mood normal.   Lab Results Lab Results  Component Value Date   WBC 11.6 (H)  02/27/2021   HGB 13.7 02/27/2021   HCT 40.8 02/27/2021   MCV 96.0 02/27/2021   PLT 193 02/27/2021    Lab Results  Component Value Date   CREATININE 1.23 02/27/2021   BUN 25 (H) 02/27/2021   NA 134 (L) 02/27/2021   K 3.7 02/27/2021   CL 100 02/27/2021   CO2 24 02/27/2021    Lab Results  Component Value Date   ALT 21 02/24/2021   AST 28 02/24/2021   ALKPHOS 71 02/24/2021   BILITOT 1.5 (H) 02/24/2021    Lab Results  Component Value Date   CHOL 204 (H) 02/21/2020   HDL 42 02/21/2020   LDLCALC 122 (H) 02/21/2020   TRIG 199 (H) 02/21/2020   CHOLHDL 4.9 02/21/2020   No results found for: LABRPR, RPRTITER No results found for: HIV1RNAQUANT, HIV1RNAVL, CD4TABS   Problem List Items Addressed This Visit       Cardiovascular and Mediastinum   Moderate to severe aortic stenosis   Acute endocarditis - Primary     Other   Cellulitis of right upper extremity    # 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  Wilber Oliphant, [facility] 05/16/2021, 3:02 PM

## 2021-05-20 ENCOUNTER — Encounter (HOSPITAL_COMMUNITY): Payer: Self-pay | Admitting: Internal Medicine

## 2021-05-23 DIAGNOSIS — R269 Unspecified abnormalities of gait and mobility: Secondary | ICD-10-CM | POA: Diagnosis not present

## 2021-05-27 ENCOUNTER — Encounter (HOSPITAL_COMMUNITY): Payer: Self-pay | Admitting: Internal Medicine

## 2021-05-27 NOTE — Anesthesia Preprocedure Evaluation (Addendum)
Anesthesia Evaluation  ?Patient identified by MRN, date of birth, ID band ?Patient awake ? ? ? ?Reviewed: ?Allergy & Precautions, H&P , NPO status , Patient's Chart, lab work & pertinent test results ? ?Airway ?Mallampati: II ? ?TM Distance: >3 FB ?Neck ROM: Full ? ? ? Dental ?no notable dental hx. ?(+) Teeth Intact, Dental Advisory Given ?  ?Pulmonary ?Current Smoker and Patient abstained from smoking.,  ?  ?Pulmonary exam normal ?breath sounds clear to auscultation ? ? ? ? ? ? Cardiovascular ?Exercise Tolerance: Good ?hypertension, Pt. on medications ?+ CAD, +CHF and + DOE  ?+ Valvular Problems/Murmurs AS  ?Rhythm:Regular Rate:Normal ? ? ?  ?Neuro/Psych ?Depression CVA   ? GI/Hepatic ?Neg liver ROS, hiatal hernia, GERD  Medicated,  ?Endo/Other  ?negative endocrine ROS ? Renal/GU ?Renal disease  ?negative genitourinary ?  ?Musculoskeletal ? ?(+) Arthritis , Osteoarthritis,   ? Abdominal ?  ?Peds ? Hematology ?negative hematology ROS ?(+)   ?Anesthesia Other Findings ? ? Reproductive/Obstetrics ?negative OB ROS ? ?  ? ? ? ? ? ? ? ? ? ? ? ? ? ?  ?  ? ? ? ? ? ? ?Anesthesia Physical ?Anesthesia Plan ? ?ASA: 3 ? ?Anesthesia Plan: MAC  ? ?Post-op Pain Management: Minimal or no pain anticipated  ? ?Induction: Intravenous ? ?PONV Risk Score and Plan: 0 and Propofol infusion ? ?Airway Management Planned: Nasal Cannula and Natural Airway ? ?Additional Equipment:  ? ?Intra-op Plan:  ? ?Post-operative Plan:  ? ?Informed Consent: I have reviewed the patients History and Physical, chart, labs and discussed the procedure including the risks, benefits and alternatives for the proposed anesthesia with the patient or authorized representative who has indicated his/her understanding and acceptance.  ? ? ? ?Dental advisory given ? ?Plan Discussed with: CRNA ? ?Anesthesia Plan Comments:   ? ? ? ? ? ?Anesthesia Quick Evaluation ? ?

## 2021-05-28 ENCOUNTER — Encounter (HOSPITAL_COMMUNITY)
Admission: RE | Disposition: A | Discharge status: Home / Self Care | Payer: Self-pay | Source: Home / Self Care | Attending: Internal Medicine

## 2021-05-28 ENCOUNTER — Ambulatory Visit (HOSPITAL_BASED_OUTPATIENT_CLINIC_OR_DEPARTMENT_OTHER)
Admission: RE | Admit: 2021-05-28 | Discharge: 2021-05-28 | Disposition: A | Discharge status: Home / Self Care | Payer: PPO | Source: Ambulatory Visit | Attending: Internal Medicine | Admitting: Internal Medicine

## 2021-05-28 ENCOUNTER — Encounter (HOSPITAL_COMMUNITY): Payer: Self-pay | Admitting: Internal Medicine

## 2021-05-28 ENCOUNTER — Ambulatory Visit (HOSPITAL_BASED_OUTPATIENT_CLINIC_OR_DEPARTMENT_OTHER): Payer: PPO | Admitting: Anesthesiology

## 2021-05-28 ENCOUNTER — Ambulatory Visit (HOSPITAL_COMMUNITY): Payer: PPO | Admitting: Anesthesiology

## 2021-05-28 ENCOUNTER — Ambulatory Visit (HOSPITAL_COMMUNITY)
Admission: RE | Admit: 2021-05-28 | Discharge: 2021-05-28 | Disposition: A | Discharge status: Home / Self Care | Payer: PPO | Attending: Internal Medicine | Admitting: Internal Medicine

## 2021-05-28 DIAGNOSIS — I472 Ventricular tachycardia, unspecified: Secondary | ICD-10-CM | POA: Insufficient documentation

## 2021-05-28 DIAGNOSIS — I330 Acute and subacute infective endocarditis: Secondary | ICD-10-CM | POA: Diagnosis not present

## 2021-05-28 DIAGNOSIS — K219 Gastro-esophageal reflux disease without esophagitis: Secondary | ICD-10-CM | POA: Insufficient documentation

## 2021-05-28 DIAGNOSIS — Z79899 Other long term (current) drug therapy: Secondary | ICD-10-CM | POA: Diagnosis not present

## 2021-05-28 DIAGNOSIS — I700 Atherosclerosis of aorta: Secondary | ICD-10-CM | POA: Diagnosis not present

## 2021-05-28 DIAGNOSIS — Z7982 Long term (current) use of aspirin: Secondary | ICD-10-CM | POA: Insufficient documentation

## 2021-05-28 DIAGNOSIS — I509 Heart failure, unspecified: Secondary | ICD-10-CM | POA: Diagnosis not present

## 2021-05-28 DIAGNOSIS — F17290 Nicotine dependence, other tobacco product, uncomplicated: Secondary | ICD-10-CM | POA: Diagnosis not present

## 2021-05-28 DIAGNOSIS — I5022 Chronic systolic (congestive) heart failure: Secondary | ICD-10-CM | POA: Insufficient documentation

## 2021-05-28 DIAGNOSIS — I2510 Atherosclerotic heart disease of native coronary artery without angina pectoris: Secondary | ICD-10-CM | POA: Insufficient documentation

## 2021-05-28 DIAGNOSIS — M1990 Unspecified osteoarthritis, unspecified site: Secondary | ICD-10-CM | POA: Diagnosis not present

## 2021-05-28 DIAGNOSIS — I493 Ventricular premature depolarization: Secondary | ICD-10-CM | POA: Diagnosis not present

## 2021-05-28 DIAGNOSIS — I350 Nonrheumatic aortic (valve) stenosis: Secondary | ICD-10-CM | POA: Diagnosis not present

## 2021-05-28 DIAGNOSIS — I081 Rheumatic disorders of both mitral and tricuspid valves: Secondary | ICD-10-CM | POA: Diagnosis not present

## 2021-05-28 DIAGNOSIS — I340 Nonrheumatic mitral (valve) insufficiency: Secondary | ICD-10-CM | POA: Diagnosis not present

## 2021-05-28 DIAGNOSIS — Z8673 Personal history of transient ischemic attack (TIA), and cerebral infarction without residual deficits: Secondary | ICD-10-CM | POA: Diagnosis not present

## 2021-05-28 DIAGNOSIS — I5020 Unspecified systolic (congestive) heart failure: Secondary | ICD-10-CM | POA: Diagnosis not present

## 2021-05-28 DIAGNOSIS — E785 Hyperlipidemia, unspecified: Secondary | ICD-10-CM | POA: Diagnosis not present

## 2021-05-28 DIAGNOSIS — I110 Hypertensive heart disease with heart failure: Secondary | ICD-10-CM | POA: Insufficient documentation

## 2021-05-28 HISTORY — PX: TEE WITHOUT CARDIOVERSION: SHX5443

## 2021-05-28 LAB — ECHO TEE
AR max vel: 1.89 cm2
AV Area VTI: 1.46 cm2
AV Area mean vel: 1.5 cm2
AV Mean grad: 12 mmHg
AV Peak grad: 17.5 mmHg
Ao pk vel: 2.09 m/s

## 2021-05-28 SURGERY — ECHOCARDIOGRAM, TRANSESOPHAGEAL
Anesthesia: Monitor Anesthesia Care

## 2021-05-28 MED ORDER — PROPOFOL 500 MG/50ML IV EMUL
INTRAVENOUS | Status: DC | PRN
  Administered 2021-05-28: 125 ug/kg/min via INTRAVENOUS

## 2021-05-28 MED ORDER — PROPOFOL 10 MG/ML IV BOLUS
INTRAVENOUS | Status: DC | PRN
  Administered 2021-05-28: 20 mg via INTRAVENOUS

## 2021-05-28 MED ORDER — EPHEDRINE SULFATE-NACL 50-0.9 MG/10ML-% IV SOSY
PREFILLED_SYRINGE | INTRAVENOUS | Status: DC | PRN
Start: 2021-05-28 — End: 2021-05-28
  Administered 2021-05-28: 5 mg via INTRAVENOUS

## 2021-05-28 MED ORDER — SODIUM CHLORIDE 0.9 % IV SOLN: INTRAVENOUS | Status: DC

## 2021-05-28 NOTE — Anesthesia Postprocedure Evaluation (Signed)
Anesthesia Post Note ? ?Patient: Ruben Barrera ? ?Procedure(s) Performed: TRANSESOPHAGEAL ECHOCARDIOGRAM (TEE) ? ?  ? ?Patient location during evaluation: Endoscopy ?Anesthesia Type: MAC ?Level of consciousness: awake and alert ?Pain management: pain level controlled ?Vital Signs Assessment: post-procedure vital signs reviewed and stable ?Respiratory status: spontaneous breathing, nonlabored ventilation and respiratory function stable ?Cardiovascular status: stable and blood pressure returned to baseline ?Postop Assessment: no apparent nausea or vomiting ?Anesthetic complications: no ? ? ?No notable events documented. ? ?Last Vitals:  ?Vitals:  ? 05/28/21 0920 05/28/21 0928  ?BP: 135/73 (!) 144/63  ?Pulse: 81 79  ?Resp: 18 (!) 22  ?Temp:    ?SpO2: 95% 96%  ?  ?Last Pain:  ?Vitals:  ? 05/28/21 0928  ?TempSrc:   ?PainSc: 0-No pain  ? ? ?  ?  ?  ?  ?  ?  ? ?Bowdy Bair,W. EDMOND ? ? ? ? ?

## 2021-05-28 NOTE — Transfer of Care (Signed)
Immediate Anesthesia Transfer of Care Note ? ?Patient: Ruben Barrera ? ?Procedure(s) Performed: TRANSESOPHAGEAL ECHOCARDIOGRAM (TEE) ? ?Patient Location: Endoscopy Unit ? ?Anesthesia Type:MAC ? ?Level of Consciousness: awake and drowsy ? ?Airway & Oxygen Therapy: Patient Spontanous Breathing and Patient connected to nasal cannula oxygen ? ?Post-op Assessment: Report given to RN and Post -op Vital signs reviewed and stable ? ?Post vital signs: Reviewed and stable ? ?Last Vitals:  ?Vitals Value Taken Time  ?BP    ?Temp    ?Pulse    ?Resp 32 05/28/21 0907  ?SpO2    ?Vitals shown include unvalidated device data. ? ?Last Pain:  ?Vitals:  ? 05/28/21 0800  ?TempSrc: Temporal  ?PainSc: 0-No pain  ?   ? ?  ? ?Complications: No notable events documented. ?

## 2021-05-28 NOTE — Anesthesia Procedure Notes (Signed)
Procedure Name: [place] ?Date/Time: 05/28/2021 8:38 AM ?Performed by: Inda Coke, CRNA ?Pre-anesthesia Checklist: Patient identified, Emergency Drugs available, Suction available, Timeout performed and Patient being monitored ?Patient Re-evaluated:Patient Re-evaluated prior to induction ?Oxygen Delivery Method: Nasal cannula ?Induction Type: IV induction ?Dental Injury: Teeth and Oropharynx as per pre-operative assessment  ? ? ? ? ?

## 2021-05-28 NOTE — Progress Notes (Signed)
?  Echocardiogram ?Echocardiogram Transesophageal has been performed. ? ?Ruben Barrera ?05/28/2021, 9:27 AM ?

## 2021-05-28 NOTE — H&P (Signed)
**Note Ruben-Identified via Obfuscation** Cardiology Office Note:    Date:  05/28/2021   ID:  Ruben Barrera, DOB 1932-12-02, MRN [id number]  PCP:  Lajean Manes, MD  [facility] Cardiologist:  Werner Lean, MD  [place] Electrophysiologist:  None   CC: Preop for TEE in follow up of up mitral valve endcarditis.  History of Present Illness:    Ruben Barrera is a 88 y.o. male with a hx of HTN, HLD, Non-obstructive CAD without intervention, HFrEF,  LFLG-AS, right CVA stroke, frequent PVCs and SVT, and Tobacco Abuse (occasional cigar) who presented for evaluation 04/19/20.  In interval had LHC 03/28/20 with diagnosis of CAD and AS.   In interim of this visit, patient EP eval- no plans for more aggressive NSVT therapy; per EP and given age no plans for ICD.  Had back surgery 07/10/20.  I had also discuss his MRA with Dr. Felipa Eth (we DC/ed this medication).  Since last visit he has had echo showing that is again consistent with Moderate to Severe LFLG AS.  He has had interval 12/22 admission and eval for infective endocarditis (saw Dr. Acie Fredrickson, Johney Frame, and Hanson in interim).  At 2023 office visit decreased lasix.  Patient is feeling similar to prior to TEE.  On the decreased lasix feels much better. Ectopy has improved.  Still doing the bike.   Past Medical History:  Diagnosis Date   Arthritis    Coronary artery disease    Depression    Dysrhythmia    "extra heart beat"   GERD (gastroesophageal reflux disease)    History of hiatal hernia    Hypercholesteremia    Hypertension    Lumbar spinal stenosis    RLS (restless legs syndrome)    takes ativan as needed   Stroke ([place]) 01/2020   minor - no deficits    Past Surgical History:  Procedure Laterality Date   BACK SURGERY     COLONOSCOPY     EYE SURGERY Bilateral    cataracts removed    LUMBAR LAMINECTOMY/DECOMPRESSION MICRODISCECTOMY N/A 08/28/2015   Procedure: Lumbar Four-Five decompressive lumbar laminectomy;  Surgeon: Jovita Gamma, MD;   Location: [place] NEURO ORS;  Service: Neurosurgery;  Laterality: N/A;   REPLACEMENT TOTAL KNEE BILATERAL Bilateral 11/05/2003   RIGHT/LEFT HEART CATH AND CORONARY ANGIOGRAPHY N/A 03/28/2020   Procedure: RIGHT/LEFT HEART CATH AND CORONARY ANGIOGRAPHY;  Surgeon: Burnell Blanks, MD;  Location: [place] CV LAB;  Service: Cardiovascular;  Laterality: N/A;   TEE WITHOUT CARDIOVERSION N/A 02/28/2021   Procedure: TRANSESOPHAGEAL ECHOCARDIOGRAM (TEE);  Surgeon: Freada Bergeron, MD;  Location: [place];  Service: Cardiovascular;  Laterality: N/A;   THORACIC DISCECTOMY N/A 07/10/2020   Procedure: Spinal cord stimulator via  - Thoracic Eight-Thoracic Nine, Thoracic Nine-Thoracic Ten Laminectomy;  Surgeon: Eustace Moore, MD;  Location: [place];  Service: Neurosurgery;  Laterality: N/A;  3C   TOE AMPUTATION Right 2010   second toe   TONSILLECTOMY      Current Medications: Current Meds  Medication Sig   amoxicillin (AMOXIL) 500 MG capsule Take 2,000 mg by mouth See admin instructions. Take 1 hour for dental procedures   celecoxib (CELEBREX) 200 MG capsule Take 200 mg by mouth daily.   ciclopirox (LOPROX) 0.77 % cream Apply 1 application topically daily.   citalopram (CELEXA) 20 MG tablet Take 20 mg by mouth daily.   clopidogrel (PLAVIX) 75 MG tablet Take 1 tablet (75 mg total) by mouth daily.   ezetimibe (ZETIA) 10 MG tablet Take 1 tablet (  10 mg total) by mouth every evening.   fluticasone (FLONASE) 50 MCG/ACT nasal spray Place 1 spray into both nostrils daily as needed for allergies.    furosemide (LASIX) 20 MG tablet Take 1 tablet (20 mg total) by mouth every other day.   gabapentin (NEURONTIN) 400 MG capsule Take 400 mg by mouth at bedtime.   gabapentin (NEURONTIN) 600 MG tablet Take 1 tablet (600 mg total) by mouth 3 (three) times daily. (Patient taking differently: Take 600 mg by mouth 2 (two) times daily. Throughout the day, NOT at bedtime)   HYDROcodone-acetaminophen (NORCO) 7.5-325 MG  tablet Take 1 tablet by mouth every 8 (eight) hours as needed for moderate pain.   Magnesium 250 MG TABS Take 250 mg by mouth daily in the afternoon.   MULTIPLE VITAMIN PO Take 1 tablet by mouth daily.    omeprazole (PRILOSEC) 20 MG capsule Take 20 mg by mouth daily.   pravastatin (PRAVACHOL) 20 MG tablet Take 1 tablet (20 mg total) by mouth daily at 6 PM.   saccharomyces boulardii (FLORASTOR) 250 MG capsule Take 250 mg by mouth daily in the afternoon.   senna-docusate (SENOKOT-S) 8.6-50 MG tablet Take 1 tablet by mouth 2 (two) times daily.   tamsulosin (FLOMAX) 0.4 MG CAPS capsule Take 1 capsule (0.4 mg total) by mouth daily.   traZODone (DESYREL) 50 MG tablet Take 25 mg by mouth at bedtime as needed for sleep.   triamcinolone (KENALOG) 0.1 % Apply 1 application topically daily as needed (irritation).     Allergies:   Simvastatin, Atorvastatin, Benazepril hcl, Losartan, Pravastatin, Rosuvastatin, and Spironolactone   Social History   Socioeconomic History   Marital status: Married    Spouse name: Not on file   Number of children: Not on file   Years of education: Not on file   Highest education level: Not on file  Occupational History   Not on file  Tobacco Use   Smoking status: Some Days    Types: Cigars   Smokeless tobacco: Never   Tobacco comments:    quit 1972- currently 1 cigar per week  Vaping Use   Vaping Use: Never used  Substance and Sexual Activity   Alcohol use: Yes    Comment: very occasionally beer/wine or scotch   Drug use: No    Comment: 1 cigar per week   Sexual activity: Not Currently  Other Topics Concern   Not on file  Social History Narrative   Right handed   Lives with wife one story home   Social Determinants of Health   Financial Resource Strain: Not on file  Food Insecurity: Not on file  Transportation Needs: Not on file  Physical Activity: Not on file  Stress: Not on file  Social Connections: Not on file    Social: Wife Comes to visit and  takes notes for him, Former Teacher, English as a foreign language of [employer]; rin 2022 65th wedding anniversary  (2022), former high school quarterback  Family History: The patient's family history includes Hypertension in an other family member; Stroke in his mother.  No history of congestive heart failure  ROS:   Please see the history of present illness.    All other systems reviewed and are negative.  EKGs/Labs/Other Studies Reviewed:    The following studies were reviewed today:  EKG:   02/21/20:  Sinus Rhythm Rate 84 with Rare PVCs, and PACs, QTc 514 ms  Cardiac Event Monitoring: Date 03/25/2020 Personally reviewed Results:   Patient had a minimum heart rate  of 44 bpm, maximum heart rate of 171 bpm, and average heart rate of 68 bpm. Predominant underlying rhythm was sinus rhythm. Three runs of ventricular tachycardia occurred lasting 11 beats at longest with a max rate of 143 bpm. Eighty-seven runs of supraventricular tachycardia occurred, lasting 17.1 seconds at longest with a max rate of 171 bpm at fastest. Isolated PACs were occasional (4.2%), with rare couplets and triplets present. Isolated PVCs were frequent (5.1%), with rare couplets, triplets, bigeminy, and trigeminy present. No evidence of complete heart block. No triggered and diary events.   Frequent PVCs, SVT, with runs of NSVT.   Transthoracic Echocardiogram: Date:11/21/20 Results: 1. Left ventricular ejection fraction, by estimation, is 40 to 45%. The  left ventricle has mildly decreased function. The left ventricle  demonstrates regional wall motion abnormalities (see scoring  diagram/findings for description). The left ventricular   internal cavity size was moderately dilated. Left ventricular diastolic  parameters are consistent with Grade I diastolic dysfunction (impaired  relaxation).   2. Right ventricular systolic function is mildly reduced. The right  ventricular size is mildly enlarged.   3. Left atrial size was severely  dilated.   4. The mitral valve is normal in structure. Mild mitral valve  regurgitation. No evidence of mitral stenosis.   5. The aortic valve has an indeterminant number of cusps. There is severe  calcifcation of the aortic valve. There is severe thickening of the aortic  valve. Aortic valve regurgitation is not visualized. Severe aortic valve  stenosis. Aortic valve area, by   VTI measures 0.76 cm. Aortic valve mean gradient measures 23.0 mmHg.  Aortic valve Vmax measures 3.21 m/s. Mean AVG and peak velocity are  consistent with moderate AS but the SVI is low at 30 and the dimensionless  index is low at 0.22. Suspect this  respresents low flow low gradient severe AS in setting of reduced EF.   6. The inferior vena cava is normal in size with greater than 50%  respiratory variability, suggesting right atrial pressure of 3 mmHg.   7. Compared to study of 02/2020, the mean transaortic gradient has  decreased from 25 to 86mHg but the dimensionless index has decreased from  0.28 to 0.22 and SVI has decreased from 40 to 30. LVF appears improved  from prior study.   FINDINGS   Left Ventricle: LVMI 197 g/m2 RWT 0.54. Left ventricular ejection fraction, by estimation, is 30 to 35%.  The left ventricle has moderately decreased function. The left ventricle  demonstrates global hypokinesis. The left ventricular internal cavity size  was mildly dilated. There is severe concentric left ventricular hypertrophy. Left ventricular diastolic parameters are indeterminate.   NM Stress Testing : Date: 02/21/2020 Results:  Notes Recorded by KDorothy Spark MD on 05/10/2014 at 2:50 PM Normal stress test  Left/Right Heart Catheterizations: Date: 03/28/2020 Results: Prox Cx lesion is 30% stenosed. Prox LAD to Mid LAD lesion is 60% stenosed. Dist LAD lesion is 60% stenosed.   1. The LAD is a large vessel that courses to the apex. Moderate, heavily calcified proximal to mid stenosis that does is  eccentric and does not appear to flow limiting. Focal mid to distal moderate stenosis.  2. Moderate disease in the intermediate branch 3. Mild plaque in the Circumflex 4. Large, dominant RCA with no obstructive disease 5. Low flow/low gradient moderate to severe aortic stenosis (Cath data: Mean gradient 16.323mg, peak to peak gradient 16 mmHg). By echo his dimensionless index is 0.28 and SVI is 40.  Recommendations: He appears to have low flow/low gradient moderate to severe aortic stenosis. He has moderate CAD but in the absence of angina, would not treat the calcific disease in the proximal and mid LAD. He is asymptomatic at this time.    Recent Labs: 02/24/2021: ALT 21; B Natriuretic Peptide 630.4 02/26/2021: Magnesium 2.3 02/27/2021: BUN 25; Creatinine, Ser 1.23; Hemoglobin 13.7; Platelets 193; Potassium 3.7; Sodium 134  Recent Lipid Panel    Component Value Date/Time   CHOL 204 (H) 02/21/2020 0253   TRIG 199 (H) 02/21/2020 0253   HDL 42 02/21/2020 0253   CHOLHDL 4.9 02/21/2020 0253   VLDL 40 02/21/2020 0253   LDLCALC 122 (H) 02/21/2020 0253   Physical Exam:    VS:  BP 137/64    Pulse 81    Temp 97.8 F (36.6 C) (Temporal)    Resp 13    Ht '5\' 9"'$  (1.753 m)    Wt 80.3 kg    SpO2 97%    BMI 26.14 kg/m     Wt Readings from Last 3 Encounters:  05/28/21 80.3 kg  05/16/21 81.2 kg  04/18/21 82.1 kg    GEN: Elderly Male HEENT: Normal NECK: No JVD CARDIAC: RRR, III/VI systolic crescendo murmur without rubs or gallops,  RESPIRATORY:  Clear to auscultation without rales, wheezing or rhonchi  ABDOMEN: Soft, non-tender, non-distended MUSCULOSKELETAL:  No edema; No deformity  SKIN: Warm and dry NEUROLOGIC:  Alert and oriented x 3 PSYCHIATRIC:  Normal affect   ASSESSMENT:    No diagnosis found.  PLAN:    Infective endocarditis of the mitral valve: - planned for TEE  Heart Failure Reduced Ejection Fraction  Low Flow Low Gradient  moderate to severe aortic stenosis - NYHA  class I, Stage B, euvolemic, etiology likely multifocal; CAD may be a component, also had frequent PVCs  (historically) - Diuretic regimen: lasix 20 mg PO q48 - Strict I/Os, daily weights, and fluid restriction of < 2 L  - unable to tolerate BB for symptomatic bradycardia - ARB related symptomatic hypotension stopped; no plans to ARNI challenge - MRA stopped by PCP in the past - After TEE will price check SGLT2i for potential start (will not start during same ate today)  We have discussed that we have limited goal directed medical therapy interventions due to hypotension and bradycardia (ARB, BB, MRA).  LVEF continues to worsen. - no plans for CRT upgrade unless LV function falls below 30% (CRT-P) after review with EP If worsening gradients we will discuss with TAVR team, if healed vegetations  Coronary Artery Disease; Nonobstructive HLD - asymptomatic - anatomy: Prox LAD 60%, mid LAD 60%, prox Cir 30% - continue ASA 81 mg -goal LDL < 70 (multiple statin intolerances in the past) -continue Zetia and pravastatin - gave education on dietary changes  NSVT and Frequent PVCs - seen by  EP  Tobacco Abuse-> - Down to two cigars a week (Pre-Contemplative for further cessation)  Will schedule likely fall follow up      Medication Adjustments/Labs and Tests Ordered: Current medicines are reviewed at length with the patient today.  Concerns regarding medicines are outlined above.  Orders Placed This Encounter  Procedures   Diet NPO time specified   Informed Consent Details: Physician/Practitioner Attestation; Transcribe to consent form and obtain patient signature   Cardiac monitoring   Vital signs   Measure blood pressure   Verify informed consent   Remove and safely store all jewelry.  Tape rings in place that  cannot be removed.   Patient to wear a single hospital gown - Ask patient to remove dentures, if any   Positioning instruction   Echo machine on patient's right. Verify  adequate EKG signal.   When patient fully awake discontinue Oxygen, change IV to saline lock and activity Ad Lib - Notify physician when completed   Notify physician (specify)   Pre-admission testing diagnosis   Pulse oximetry, continuous   Oxygen therapy Mode or (Route): Nasal cannula; Liters Per Minute: 2   ECHO TEE   Insert peripheral IV    Meds ordered this encounter  Medications   0.9 %  sodium chloride infusion     There are no outpatient Patient Instructions on file for this admission.   Signed, Werner Lean, MD  05/28/2021 8:08 AM    [place]

## 2021-05-28 NOTE — CV Procedure (Signed)
? ? ?  TRANSESOPHAGEAL ECHOCARDIOGRAM  ? ?NAME:  ROMMEL HOGSTON    ?MRN: [id number] ?DOB:  1932-05-03    ?ADMIT DATE: 05/28/2021 ? ?INDICATIONS: ?Mitral Valve Endocarditis (follow up) ? ?PROCEDURE:  ? ?Informed consent was obtained prior to the procedure. The risks, benefits and alternatives for the procedure were discussed and the patient comprehended these risks.  Risks include, but are not limited to, cough, sore throat, vomiting, nausea, somnolence, esophageal and stomach trauma or perforation, bleeding, low blood pressure, aspiration, pneumonia, infection, trauma to the teeth and death.   ? ?Procedural time out performed. The oropharynx was anesthetized with topical 1% benzocaine.   ? ?Anesthesia was administered by Dr. Ola Spurr and team. The patient's heart rate, blood pressure, and oxygen saturation are monitored continuously during the procedure.  ? ?The transesophageal probe was inserted in the esophagus and stomach without difficulty and multiple views were obtained.  ? ?COMPLICATIONS:   ? ?There were no immediate complications. ? ?KEY FINDINGS: ? ?Moderate Aortic Stenosis. ?Heart Failure with reduced ejection fraction LVEF ~ 30% ?No evidence of endocarditis.  ?Full report to follow. ?Further management per primary team.  ? ?Rudean Haskell, MD ?[place]  ?9:08 AM ? ? ?

## 2021-05-28 NOTE — Discharge Instructions (Signed)

## 2021-05-29 ENCOUNTER — Telehealth: Payer: Self-pay

## 2021-05-29 ENCOUNTER — Encounter (HOSPITAL_COMMUNITY): Payer: Self-pay | Admitting: Internal Medicine

## 2021-05-29 NOTE — Telephone Encounter (Signed)
Called pt reviewed MD comments and scheduled OV for 12/08/21 at 11:20 am.   ?

## 2021-05-29 NOTE — Telephone Encounter (Signed)
-----   Message from Werner Lean, MD sent at 05/28/2021  9:12 AM EST ----- ?Regarding: Summer to Fall Follow UP ?LVEF is still decreased, AS is stable, patient feels great on decreased lasix. ?Can we see him summer/fall for LVEF follow up and to discuss verociguat. ? ?Thanks, ?MAC ? ? ?

## 2021-06-17 DIAGNOSIS — M961 Postlaminectomy syndrome, not elsewhere classified: Secondary | ICD-10-CM | POA: Diagnosis not present

## 2021-06-17 DIAGNOSIS — F1120 Opioid dependence, uncomplicated: Secondary | ICD-10-CM | POA: Diagnosis not present

## 2021-06-17 DIAGNOSIS — Z9689 Presence of other specified functional implants: Secondary | ICD-10-CM | POA: Diagnosis not present

## 2021-06-17 DIAGNOSIS — G894 Chronic pain syndrome: Secondary | ICD-10-CM | POA: Diagnosis not present

## 2021-07-01 DIAGNOSIS — R269 Unspecified abnormalities of gait and mobility: Secondary | ICD-10-CM | POA: Diagnosis not present

## 2021-07-04 DIAGNOSIS — Z96652 Presence of left artificial knee joint: Secondary | ICD-10-CM | POA: Diagnosis not present

## 2021-07-04 DIAGNOSIS — Z96651 Presence of right artificial knee joint: Secondary | ICD-10-CM | POA: Diagnosis not present

## 2021-07-04 DIAGNOSIS — Z96653 Presence of artificial knee joint, bilateral: Secondary | ICD-10-CM | POA: Diagnosis not present

## 2021-07-08 DIAGNOSIS — I5022 Chronic systolic (congestive) heart failure: Secondary | ICD-10-CM | POA: Diagnosis not present

## 2021-07-08 DIAGNOSIS — I10 Essential (primary) hypertension: Secondary | ICD-10-CM | POA: Diagnosis not present

## 2021-07-08 DIAGNOSIS — E7800 Pure hypercholesterolemia, unspecified: Secondary | ICD-10-CM | POA: Diagnosis not present

## 2021-07-08 DIAGNOSIS — F322 Major depressive disorder, single episode, severe without psychotic features: Secondary | ICD-10-CM | POA: Diagnosis not present

## 2021-07-21 ENCOUNTER — Encounter: Payer: Self-pay | Admitting: Podiatry

## 2021-07-21 ENCOUNTER — Ambulatory Visit: Payer: PPO | Admitting: Podiatry

## 2021-07-21 DIAGNOSIS — G629 Polyneuropathy, unspecified: Secondary | ICD-10-CM | POA: Diagnosis not present

## 2021-07-21 DIAGNOSIS — M7751 Other enthesopathy of right foot: Secondary | ICD-10-CM | POA: Diagnosis not present

## 2021-07-21 MED ORDER — TRIAMCINOLONE ACETONIDE 10 MG/ML IJ SUSP
10.0000 mg | Freq: Once | INTRAMUSCULAR | Status: AC
  Administered 2021-07-21: 10 mg

## 2021-07-21 NOTE — Progress Notes (Signed)
**Note Ruben-Identified via Obfuscation** Subjective:  ? ?Patient ID: Ruben Barrera, male   DOB: 88 y.o.   MRN: [id number]  ? ?HPI ?Patient presents stating he has been diagnosed with neuropathy and he also gets pain that seems to be mostly in his right ankle and forefoot but the ankle seems worse with mild discomfort left not to the same degree.  Patient also has structural bunion deformity that is a problem and patient does not smoke likes to be active ? ? ?Review of Systems  ?All other systems reviewed and are negative. ? ? ?   ?Objective:  ?Physical Exam ?Vitals and nursing note reviewed.  ?Constitutional:   ?   Appearance: He is well-developed.  ?Pulmonary:  ?   Effort: Pulmonary effort is normal.  ?Musculoskeletal:     ?   General: Normal range of motion.  ?Skin: ?   General: Skin is warm.  ?Neurological:  ?   Mental Status: He is alert.  ?  ?Neurovascular status intact muscle strength was found to be adequate range of motion is moderately diminished.  Patient does have mild diminishment sharp dull vibratory was noted to have discomfort which appears to be exquisite in the right sinus tarsi ankle and mild forefoot pain not to the same degree.  Patient has good digital perfusion well oriented x3 ? ?   ?Assessment:  ?Inflammatory sinus tarsitis right with inflammation fluid buildup along with forefoot pain and neuropathic pain ? ?   ?Plan:  ?H&P reviewed all conditions discussed with him.  At this point we will get a focus on the ankle and I did do sterile prep and I injected the sinus tarsi right 3 mg Kenalog 5 mg Xylocaine and advised on anti-inflammatories and support shoes.  Patient will be seen back for [place] to recheck is encouraged to call with questions concerns ? ?X-rays did indicate arthritis of the ankle joint subtalar not severe and moderate flatfoot deformity bunion deformity ?   ? ? ?

## 2021-08-05 DIAGNOSIS — H35371 Puckering of macula, right eye: Secondary | ICD-10-CM | POA: Diagnosis not present

## 2021-08-05 DIAGNOSIS — H04123 Dry eye syndrome of bilateral lacrimal glands: Secondary | ICD-10-CM | POA: Diagnosis not present

## 2021-08-05 DIAGNOSIS — H52203 Unspecified astigmatism, bilateral: Secondary | ICD-10-CM | POA: Diagnosis not present

## 2021-08-05 DIAGNOSIS — H524 Presbyopia: Secondary | ICD-10-CM | POA: Diagnosis not present

## 2021-08-11 DIAGNOSIS — R0981 Nasal congestion: Secondary | ICD-10-CM | POA: Diagnosis not present

## 2021-09-03 ENCOUNTER — Telehealth: Payer: Self-pay | Admitting: Internal Medicine

## 2021-09-03 DIAGNOSIS — G894 Chronic pain syndrome: Secondary | ICD-10-CM | POA: Diagnosis not present

## 2021-09-03 DIAGNOSIS — Z9689 Presence of other specified functional implants: Secondary | ICD-10-CM | POA: Diagnosis not present

## 2021-09-03 DIAGNOSIS — F1120 Opioid dependence, uncomplicated: Secondary | ICD-10-CM | POA: Diagnosis not present

## 2021-09-03 NOTE — Telephone Encounter (Signed)
Called pt in regards to low HR reading.  Pt reports was sitting reading the paper when watch alerted that HR was low 40's.  Pt denies feeling dizzy, lightheaded, and feeling like was going to pass out.  HR has only read low once.  Pt also called PCP who advised pt to only take 1/2 dose of metoprolol.  I advised pt that metoprolol was stopped previously.  Pt reports that PCP placed back on metoprolol months ago.  Does not know the dosage.  Will call back in with this information. Pt also beginning to notice SOB with increased activity.   Advised pt if HR reads low and pt becomes lightheaded or feels like is going to faint to become concerned and notify our office. Pt thanked me for call.

## 2021-09-03 NOTE — Telephone Encounter (Signed)
New Message:     Patient had some concerns and he wants Dr Gasper Sells to decide if he needs to be seen.   Last night he had a low heart rate.  STAT if HR is under 50 or over 120 (normal HR is 60-100 beats per minute)  What is your heart rate? It was 40 and it lasted for about 5 minutes this registered on his watch  Do you have a log of your heart rate readings (document readings)?   Do you have any other symptoms? Patient says he have been having some tightness in his his chest when he walks outdoors. He says it have not been happeing when he walks inside- patient is not sure if he needs o be seen

## 2021-09-10 DIAGNOSIS — R42 Dizziness and giddiness: Secondary | ICD-10-CM | POA: Diagnosis not present

## 2021-09-11 ENCOUNTER — Ambulatory Visit: Payer: PPO | Attending: Internal Medicine | Admitting: Physical Therapy

## 2021-09-11 ENCOUNTER — Encounter: Payer: Self-pay | Admitting: Physical Therapy

## 2021-09-11 DIAGNOSIS — R42 Dizziness and giddiness: Secondary | ICD-10-CM | POA: Diagnosis not present

## 2021-09-11 DIAGNOSIS — H8112 Benign paroxysmal vertigo, left ear: Secondary | ICD-10-CM | POA: Insufficient documentation

## 2021-09-11 DIAGNOSIS — H8111 Benign paroxysmal vertigo, right ear: Secondary | ICD-10-CM | POA: Diagnosis not present

## 2021-09-11 DIAGNOSIS — R2681 Unsteadiness on feet: Secondary | ICD-10-CM | POA: Diagnosis not present

## 2021-09-11 NOTE — Patient Instructions (Addendum)
Self Treatment for Horizontal Cupulolithiasis - Left Side Affected    Lie on left side all night. In morning, roll onto right side and turn head down toward bed for 2 minutes. Sit up from this position. Repeat sequence nightly.    Rolling (Active)    Lie on back, legs straight. Draw left leg up and roll to other side. Complete _1_ sets of _10__ repetitions. Perform __2-3_ sessions per day.   Roll from supine to Rt and Lt sides 10 times each - 2-3x/day

## 2021-09-11 NOTE — Therapy (Signed)
OUTPATIENT PHYSICAL THERAPY VESTIBULAR EVALUATION     Patient Name: Ruben Barrera MRN: [id number] DOB:1932/12/12, 88 y.o., male Today's Date: 09/11/2021  PCP: Lajean Manes, MD REFERRING PROVIDER: Kathalene Frames, MD    PT End of Session - 09/11/21 2018     Visit Number 1    Number of Visits 5   eval + 4 visits   Authorization Type HTA    Authorization Time Period 09-11-21 - 11-11-21    PT Start Time 1403    PT Stop Time 1450    PT Time Calculation (min) 47 min    Activity Tolerance Patient tolerated treatment well    Behavior During Therapy [facility] for tasks assessed/performed             Past Medical History:  Diagnosis Date   Arthritis    Coronary artery disease    Depression    Dysrhythmia    "extra heart beat"   GERD (gastroesophageal reflux disease)    History of hiatal hernia    Hypercholesteremia    Hypertension    Lumbar spinal stenosis    RLS (restless legs syndrome)    takes ativan as needed   Stroke ([place]) 01/2020   minor - no deficits   Past Surgical History:  Procedure Laterality Date   BACK SURGERY     COLONOSCOPY     EYE SURGERY Bilateral    cataracts removed    LUMBAR LAMINECTOMY/DECOMPRESSION MICRODISCECTOMY N/A 08/28/2015   Procedure: Lumbar Four-Five decompressive lumbar laminectomy;  Surgeon: Jovita Gamma, MD;  [facility]: [facility];  Service: Neurosurgery;  Laterality: N/A;   REPLACEMENT TOTAL KNEE BILATERAL Bilateral 11/05/2003   RIGHT/LEFT HEART CATH AND CORONARY ANGIOGRAPHY N/A 03/28/2020   Procedure: RIGHT/LEFT HEART CATH AND CORONARY ANGIOGRAPHY;  Surgeon: Burnell Blanks, MD;  Location: [place] [facility];  Service: Cardiovascular;  Laterality: N/A;   TEE WITHOUT CARDIOVERSION N/A 02/28/2021   Procedure: TRANSESOPHAGEAL ECHOCARDIOGRAM (TEE);  Surgeon: Freada Bergeron, MD;  Location: [facility] ENDOSCOPY;  Service: Cardiovascular;  Laterality: N/A;   TEE WITHOUT CARDIOVERSION N/A 05/28/2021   Procedure: TRANSESOPHAGEAL  ECHOCARDIOGRAM (TEE);  Surgeon: Werner Lean, MD;  Location: [facility] ENDOSCOPY;  Service: Cardiovascular;  Laterality: N/A;   THORACIC DISCECTOMY N/A 07/10/2020   Procedure: Spinal cord stimulator via  - Thoracic Eight-Thoracic Nine, Thoracic Nine-Thoracic Ten Laminectomy;  Surgeon: Eustace Moore, MD;  Location: [place];  Service: Neurosurgery;  Laterality: N/A;  3C   TOE AMPUTATION Right 2010   second toe   TONSILLECTOMY     Patient Active Problem List   Diagnosis Date Noted   Bacterial endocarditis    Acute endocarditis 04/12/2021   Medication monitoring encounter 04/12/2021   Bacteremia due to Streptococcus 02/28/2021   Acute on chronic combined systolic and diastolic CHF (congestive heart failure) ([place]) 02/26/2021   RLS (restless legs syndrome) 02/26/2021   AKI (acute kidney injury) ([place]) 02/26/2021   Acute hypoxemic respiratory failure ([place]) 02/24/2021   S/P insertion of spinal cord stimulator 07/10/2020   Frequent PVCs 04/15/2020   Preop cardiovascular exam 04/15/2020   HFrEF (heart failure with reduced ejection fraction) ([place]) 04/15/2020   SVT (supraventricular tachycardia) ([place]) 04/15/2020   Coronary artery disease involving native coronary artery of native heart without angina pectoris 04/15/2020   Nonrheumatic aortic valve stenosis 03/19/2020   Dyslipidemia    Chronic systolic congestive heart failure ([place])    Acute ischemic cerebrovascular accident (CVA) involving anterior cerebral artery territory ([place]) 02/22/2020   Mixed hyperlipidemia  Tobacco abuse    Depression    Acute stroke due to occlusion of right cerebellar artery ([place]) 02/20/2020   Erythrocytosis 02/20/2020   BPH (benign prostatic hyperplasia) 02/20/2020   HNP (herniated nucleus pulposus), lumbar 01/13/2016   Lumbar stenosis with neurogenic claudication 08/28/2015   DOE (dyspnea on exertion) 05/01/2014   Essential hypertension 05/01/2014   Left shoulder pain 05/05/2011    ONSET DATE: September 08, 2021  REFERRING DIAG: Vertigo:  Dizziness  THERAPY DIAG:  BPPV (benign paroxysmal positional vertigo), left - Plan: PT plan of care cert/re-cert  Dizziness and giddiness - Plan: PT plan of care cert/re-cert  Unsteadiness on feet - Plan: PT plan of care cert/re-cert  Rationale for Evaluation and Treatment Rehabilitation  SUBJECTIVE:   SUBJECTIVE STATEMENT: Pt reports the vertigo started on Monday this week while he was doing some leg exercises; pt reports he has had vertigo in the past - many years ago when he had anxiety with a job Pt accompanied by:  wife, [name]  PERTINENT HISTORY:   Ruben Barrera is a 88 y.o. male with a hx of HTN, HLD, Non-obstructive CAD without intervention, HFrEF,  LFLG-AS, right CVA stroke, frequent PVCs and SVT ; has a spinal pain stimulator implant   PAIN:  Are you having pain? No  PRECAUTIONS: Other: vertigo/unsteadiness   WEIGHT BEARING RESTRICTIONS No  FALLS: Has patient fallen in last 6 months? No  LIVING ENVIRONMENT: Lives with: lives with their spouse Lives in: House/apartment  PLOF: Independent  PATIENT GOALS resolve the vertigo   OBJECTIVE:   GAIT: Gait pattern: WFL Distance walked: 37' Assistive device utilized: None Level of assistance: Modified independence  PATIENT SURVEYS:  FOTO was not captured by front office   VESTIBULAR ASSESSMENT   GENERAL OBSERVATION: pt amb. Without device    SYMPTOM BEHAVIOR:   Subjective history: Pt states he was given exercises by his doctor to do for the vertigo - states he did them yesterday and again this morning and he thinks it has helped   Non-Vestibular symptoms:  N/A   Type of dizziness: Spinning/Vertigo   Frequency: usually daily   Duration: secs to minutes   Aggravating factors: Induced by position change: rolling to the left   Relieving factors: head stationary and lying supine   Progression of symptoms: better      POSITIONAL TESTING: Left Dix-Hallpike: no  nystagmus Left Roll Test: apogeotropic nystagmus    VESTIBULAR TREATMENT:  Canalith Repositioning:   BBQ Roll Left: Number of Reps: 2, Response to Treatment: symptoms improved, and Comment: pt continued to have Lt ageotropic nystagmus on 2nd rep; pt reported feeling slightly nauseous and felt very tired after 2nd rep     PATIENT EDUCATION: Education details: HEP - rolling 10 times Rt to Lt, 2 times/day Person educated: Patient and Spouse Education method: Explanation, Demonstration, and Handouts Education comprehension: verbalized understanding   GOALS: Goals reviewed with patient? Yes   LONG TERM GOALS: Target date: 10/09/2021    Pt will have (-) Lt horizontal roll test to indicate resolution of Lt BPPV horizontal cupulolithiasis.  Baseline:  Goal status: INITIAL  2.  Pt will perform bed mobility with no c/o dizziness. Baseline:  Goal status: INITIAL  3.  Pt will report no vertigo with any mobility.  Baseline:  Goal status: INITIAL  4.  Independent in HEP for habituation and self treatment of BPPV. Baseline:  Goal status: INITIAL  ASSESSMENT:  CLINICAL IMPRESSION: Patient is a 88 y.o. gentleman who was seen  today for physical therapy evaluation and treatment for vertigo.  Pt had (+) Lt horizontal roll test with ageotropic horizontal nystagmus, indicative of Lt BPPV cupulolithiasis.  Bar-b-que roll was performed for 2 reps with slight improvement noted on 2nd rep.  Pt will benefit from PT to address vertigo/BPPV.    OBJECTIVE IMPAIRMENTS decreased balance and dizziness.   ACTIVITY LIMITATIONS carrying, bending, bed mobility, and dressing  PARTICIPATION LIMITATIONS: meal prep, cleaning, laundry, driving, shopping, and community activity  PERSONAL FACTORS 1 comorbidity:    REHAB POTENTIAL: Good  CLINICAL DECISION MAKING: Stable/uncomplicated  EVALUATION COMPLEXITY: Low   PLAN: PT FREQUENCY: 1x/week  PT DURATION: 4 weeks  PLANNED INTERVENTIONS: Therapeutic  exercises, Therapeutic activity, Neuromuscular re-education, Balance training, Gait training, Patient/Family education, and Vestibular training  PLAN FOR NEXT SESSION: recheck Lt horizontal BPPV    Zarina Pe, [place], PT 09/11/2021, 9:07 PM

## 2021-09-16 ENCOUNTER — Ambulatory Visit: Payer: PPO | Admitting: Physical Therapy

## 2021-09-16 DIAGNOSIS — H8112 Benign paroxysmal vertigo, left ear: Secondary | ICD-10-CM | POA: Diagnosis not present

## 2021-09-16 DIAGNOSIS — H8111 Benign paroxysmal vertigo, right ear: Secondary | ICD-10-CM

## 2021-09-16 DIAGNOSIS — R42 Dizziness and giddiness: Secondary | ICD-10-CM

## 2021-09-16 NOTE — Therapy (Signed)
OUTPATIENT PHYSICAL THERAPY VESTIBULAR TREATMENT     Patient Name: Ruben Barrera MRN: [id number] DOB:1932-03-26, 88 y.o., male Today's Date: 09/17/2021  PCP: Ruben Manes, MD REFERRING PROVIDER: Kathalene Frames, MD    PT End of Session - 09/17/21 1231     Visit Number 2    Number of Visits 5   eval + 4 visits   Authorization Type HTA    Authorization Time Period 09-11-21 - 11-11-21    PT Start Time 1532    PT Stop Time 1612    PT Time Calculation (min) 40 min    Activity Tolerance Patient tolerated treatment well    Behavior During Therapy [facility] for tasks assessed/performed              Past Medical History:  Diagnosis Date   Arthritis    Coronary artery disease    Depression    Dysrhythmia    "extra heart beat"   GERD (gastroesophageal reflux disease)    History of hiatal hernia    Hypercholesteremia    Hypertension    Lumbar spinal stenosis    RLS (restless legs syndrome)    takes ativan as needed   Stroke ([place]) 01/2020   minor - no deficits   Past Surgical History:  Procedure Laterality Date   BACK SURGERY     COLONOSCOPY     EYE SURGERY Bilateral    cataracts removed    LUMBAR LAMINECTOMY/DECOMPRESSION MICRODISCECTOMY N/A 08/28/2015   Procedure: Lumbar Four-Five decompressive lumbar laminectomy;  Surgeon: Ruben Gamma, MD;  Location: [facility];  Service: Neurosurgery;  Laterality: N/A;   REPLACEMENT TOTAL KNEE BILATERAL Bilateral 11/05/2003   RIGHT/LEFT HEART CATH AND CORONARY ANGIOGRAPHY N/A 03/28/2020   Procedure: RIGHT/LEFT HEART CATH AND CORONARY ANGIOGRAPHY;  Surgeon: Ruben Blanks, MD;  Location: [place] [facility];  Service: Cardiovascular;  Laterality: N/A;   TEE WITHOUT CARDIOVERSION N/A 02/28/2021   Procedure: TRANSESOPHAGEAL ECHOCARDIOGRAM (TEE);  Surgeon: Ruben Bergeron, MD;  Location: [facility] ENDOSCOPY;  Service: Cardiovascular;  Laterality: N/A;   TEE WITHOUT CARDIOVERSION N/A 05/28/2021   Procedure: TRANSESOPHAGEAL  ECHOCARDIOGRAM (TEE);  Surgeon: Ruben Lean, MD;  Location: [facility] ENDOSCOPY;  Service: Cardiovascular;  Laterality: N/A;   THORACIC DISCECTOMY N/A 07/10/2020   Procedure: Spinal cord stimulator via  - Thoracic Eight-Thoracic Nine, Thoracic Nine-Thoracic Ten Laminectomy;  Surgeon: Ruben Moore, MD;  Location: [place];  Service: Neurosurgery;  Laterality: N/A;  3C   TOE AMPUTATION Right 2010   second toe   TONSILLECTOMY     Patient Active Problem List   Diagnosis Date Noted   Bacterial endocarditis    Acute endocarditis 04/12/2021   Medication monitoring encounter 04/12/2021   Bacteremia due to Streptococcus 02/28/2021   Acute on chronic combined systolic and diastolic CHF (congestive heart failure) ([place]) 02/26/2021   RLS (restless legs syndrome) 02/26/2021   AKI (acute kidney injury) ([place]) 02/26/2021   Acute hypoxemic respiratory failure ([place]) 02/24/2021   S/P insertion of spinal cord stimulator 07/10/2020   Frequent PVCs 04/15/2020   Preop cardiovascular exam 04/15/2020   HFrEF (heart failure with reduced ejection fraction) ([place]) 04/15/2020   SVT (supraventricular tachycardia) ([place]) 04/15/2020   Coronary artery disease involving native coronary artery of native heart without angina pectoris 04/15/2020   Nonrheumatic aortic valve stenosis 03/19/2020   Dyslipidemia    Chronic systolic congestive heart failure ([place])    Acute ischemic cerebrovascular accident (CVA) involving anterior cerebral artery territory ([place]) 02/22/2020   Mixed hyperlipidemia  Tobacco abuse    Depression    Acute stroke due to occlusion of right cerebellar artery ([place]) 02/20/2020   Erythrocytosis 02/20/2020   BPH (benign prostatic hyperplasia) 02/20/2020   HNP (herniated nucleus pulposus), lumbar 01/13/2016   Lumbar stenosis with neurogenic claudication 08/28/2015   DOE (dyspnea on exertion) 05/01/2014   Essential hypertension 05/01/2014   Left shoulder pain 05/05/2011    ONSET DATE: September 08, 2021  REFERRING DIAG: Vertigo:  Dizziness  THERAPY DIAG:  BPPV (benign paroxysmal positional vertigo), right  Dizziness and giddiness  Rationale for Evaluation and Treatment Rehabilitation  SUBJECTIVE:   SUBJECTIVE STATEMENT: Pt reports he had no vertigo after treatment last Thursday until he started feeling something Sunday night; reports the vertigo was really severe when he got up out of bed this morning - states he went down to the floor from sitting on his bed (Controlled fall)   Pt accompanied by:  wife, Ruben Barrera  PERTINENT HISTORY:   Ruben Barrera is a 88 y.o. male with a hx of HTN, HLD, Non-obstructive CAD without intervention, HFrEF,  LFLG-AS, right CVA stroke, frequent PVCs and SVT ; has a spinal pain stimulator implant   PAIN:  Are you having pain? No  PRECAUTIONS: Other: vertigo/unsteadiness   WEIGHT BEARING RESTRICTIONS No  FALLS: Has patient fallen in last 6 months? No  LIVING ENVIRONMENT: Lives with: lives with their spouse Lives in: House/apartment  PLOF: Independent  PATIENT GOALS resolve the vertigo   OBJECTIVE:    VESTIBULAR ASSESSMENT   NeuroRe-ed:      POSITIONAL TESTING: pt had (+) Rt sidelying test with Rt rotary upbeating nystagmus and c/o moderate vertigo in test position: (-) Lt sidelying test with no nystagmus and no c/o vertigo    Rt Dix-Hallpike test (+) with Rt rotary upbeating nystagmus and c/o severe vertigo in test position   Lt Dix-Hallpike test (-) with no nystagmus and no c/o vertigo  VESTIBULAR TREATMENT:  Canalith Repositioning:  Epley maneuver for Rt BPPV posterior canalithiasis; performed 3 reps with no nystagmus and only min. Dizziness reported on 3rd rep    PATIENT EDUCATION: Education details: HEP -  Instructed pt in Ruben Barrera exercises for self treatment prn Person educated: Patient and Spouse Education method: Explanation, Demonstration, and Handouts Education comprehension: verbalized  understanding   GOALS: Goals reviewed with patient? Yes   LONG TERM GOALS: Target date: 10/09/2021    Pt will have (-) Lt horizontal roll test to indicate resolution of Lt BPPV horizontal cupulolithiasis.  Baseline:  Goal status: INITIAL  2.  Pt will perform bed mobility with no c/o dizziness. Baseline:  Goal status: INITIAL  3.  Pt will report no vertigo with any mobility.  Baseline:  Goal status: INITIAL  4.  Independent in HEP for habituation and self treatment of BPPV. Baseline:  Goal status: INITIAL  ASSESSMENT:  CLINICAL IMPRESSION: Patient had negative horizontal roll test indicating resolution of Lt BPPV horizontal cupulolithiasis.  Pt did have (+) Rt sidelying and Rt Dix-Hallpike tests with Rt rotary upbeating nystagmus and c/o vertigo in test positions.  Pt was treated with 3 reps of Epley maneuver and BPPV appeared to be resolved on 3rd rep.  Will cont to assess & treat as indicated.    OBJECTIVE IMPAIRMENTS decreased balance and dizziness.   ACTIVITY LIMITATIONS carrying, bending, bed mobility, and dressing  PARTICIPATION LIMITATIONS: meal prep, cleaning, laundry, driving, shopping, and community activity  PERSONAL FACTORS 1 comorbidity:    REHAB POTENTIAL: Good  CLINICAL  DECISION MAKING: Stable/uncomplicated  EVALUATION COMPLEXITY: Low   PLAN: PT FREQUENCY: 1x/week  PT DURATION: 4 weeks  PLANNED INTERVENTIONS: Therapeutic exercises, Therapeutic activity, Neuromuscular re-education, Balance training, Gait training, Patient/Family education, and Vestibular training  PLAN FOR NEXT SESSION: recheck Rt BPPV    Wm Sahagun, [place], PT 09/17/2021, 12:33 PM

## 2021-09-17 ENCOUNTER — Encounter: Payer: Self-pay | Admitting: Physical Therapy

## 2021-10-02 ENCOUNTER — Ambulatory Visit: Payer: PPO | Attending: Internal Medicine | Admitting: Physical Therapy

## 2021-10-02 DIAGNOSIS — H8111 Benign paroxysmal vertigo, right ear: Secondary | ICD-10-CM | POA: Diagnosis not present

## 2021-10-02 NOTE — Therapy (Addendum)
OUTPATIENT PHYSICAL THERAPY VESTIBULAR TREATMENT     Patient Name: Ruben Barrera MRN: [id number] DOB:[birth year], 88 y.o., male Today's Date: 10/03/2021  PCP: Lajean Manes, MD REFERRING PROVIDER: Kathalene Frames, MD    PT End of Session - 10/03/21 1010     Visit Number 3    Number of Visits 5   eval + 4 visits   Authorization Type HTA    Authorization Time Period 09-11-21 - 11-11-21    PT Start Time 0846    PT Stop Time 0928    PT Time Calculation (min) 42 min    Activity Tolerance Patient tolerated treatment well    Behavior During Therapy [facility] for tasks assessed/performed               Past Medical History:  Diagnosis Date   Arthritis    Coronary artery disease    Depression    Dysrhythmia    "extra heart beat"   GERD (gastroesophageal reflux disease)    History of hiatal hernia    Hypercholesteremia    Hypertension    Lumbar spinal stenosis    RLS (restless legs syndrome)    takes ativan as needed   Stroke ([place]) 01/2020   minor - no deficits   Past Surgical History:  Procedure Laterality Date   BACK SURGERY     COLONOSCOPY     EYE SURGERY Bilateral    cataracts removed    LUMBAR LAMINECTOMY/DECOMPRESSION MICRODISCECTOMY N/A 08/28/2015   Procedure: Lumbar Four-Five decompressive lumbar laminectomy;  Surgeon: Jovita Gamma, MD;  Location: [facility];  Service: Neurosurgery;  Laterality: N/A;   REPLACEMENT TOTAL KNEE BILATERAL Bilateral 11/05/2003   RIGHT/LEFT HEART CATH AND CORONARY ANGIOGRAPHY N/A 03/28/2020   Procedure: RIGHT/LEFT HEART CATH AND CORONARY ANGIOGRAPHY;  Surgeon: Burnell Blanks, MD;  Location: [place] [facility];  Service: Cardiovascular;  Laterality: N/A;   TEE WITHOUT CARDIOVERSION N/A 02/28/2021   Procedure: TRANSESOPHAGEAL ECHOCARDIOGRAM (TEE);  Surgeon: Freada Bergeron, MD;  Location: [facility] ENDOSCOPY;  Service: Cardiovascular;  Laterality: N/A;   TEE WITHOUT CARDIOVERSION N/A 05/28/2021   Procedure: TRANSESOPHAGEAL  ECHOCARDIOGRAM (TEE);  Surgeon: Werner Lean, MD;  Location: [facility] ENDOSCOPY;  Service: Cardiovascular;  Laterality: N/A;   THORACIC DISCECTOMY N/A 07/10/2020   Procedure: Spinal cord stimulator via  - Thoracic Eight-Thoracic Nine, Thoracic Nine-Thoracic Ten Laminectomy;  Surgeon: Eustace Moore, MD;  Location: [place];  Service: Neurosurgery;  Laterality: N/A;  3C   TOE AMPUTATION Right 2010   second toe   TONSILLECTOMY     Patient Active Problem List   Diagnosis Date Noted   Bacterial endocarditis    Acute endocarditis 04/12/2021   Medication monitoring encounter 04/12/2021   Bacteremia due to Streptococcus 02/28/2021   Acute on chronic combined systolic and diastolic CHF (congestive heart failure) ([place]) 02/26/2021   RLS (restless legs syndrome) 02/26/2021   AKI (acute kidney injury) ([place]) 02/26/2021   Acute hypoxemic respiratory failure ([place]) 02/24/2021   S/P insertion of spinal cord stimulator 07/10/2020   Frequent PVCs 04/15/2020   Preop cardiovascular exam 04/15/2020   HFrEF (heart failure with reduced ejection fraction) ([place]) 04/15/2020   SVT (supraventricular tachycardia) ([place]) 04/15/2020   Coronary artery disease involving native coronary artery of native heart without angina pectoris 04/15/2020   Nonrheumatic aortic valve stenosis 03/19/2020   Dyslipidemia    Chronic systolic congestive heart failure ([place])    Acute ischemic cerebrovascular accident (CVA) involving anterior cerebral artery territory ([place]) 02/22/2020   Mixed hyperlipidemia  Tobacco abuse    Depression    Acute stroke due to occlusion of right cerebellar artery ([place]) 02/20/2020   Erythrocytosis 02/20/2020   BPH (benign prostatic hyperplasia) 02/20/2020   HNP (herniated nucleus pulposus), lumbar 01/13/2016   Lumbar stenosis with neurogenic claudication 08/28/2015   DOE (dyspnea on exertion) 05/01/2014   Essential hypertension 05/01/2014   Left shoulder pain 05/05/2011    ONSET DATE: September 08, 2021  REFERRING DIAG: Vertigo:  Dizziness  THERAPY DIAG:  BPPV (benign paroxysmal positional vertigo), right  Rationale for Evaluation and Treatment Rehabilitation  SUBJECTIVE:   SUBJECTIVE STATEMENT: Pt reports the vertigo is much improved but says it is still there some; states he gets dizzy when he lies down to go to bed but it only lasts a few seconds and then goes away; continues to have light-headedness when he stands up after having been seated for a while   Pt accompanied by:  wife, [name]  PERTINENT HISTORY:   Ruben Barrera is a 88 y.o. male with a hx of HTN, HLD, Non-obstructive CAD without intervention, HFrEF,  LFLG-AS, right CVA stroke, frequent PVCs and SVT ; has a spinal pain stimulator implant   PAIN:  Are you having pain? No  PRECAUTIONS: Other: vertigo/unsteadiness   WEIGHT BEARING RESTRICTIONS No  FALLS: Has patient fallen in last 6 months? No  LIVING ENVIRONMENT: Lives with: lives with their spouse Lives in: House/apartment  PLOF: Independent  PATIENT GOALS resolve the vertigo   OBJECTIVE:    VESTIBULAR ASSESSMENT   NeuroRe-ed:      POSITIONAL TESTING: pt had (+) Rt sidelying test with Rt rotary upbeating nystagmus and c/o moderate vertigo in test position:     Rt Dix-Hallpike test (+) with Rt rotary upbeating nystagmus and c/o severe vertigo in test position     VESTIBULAR TREATMENT:  Canalith Repositioning:  Epley maneuver for Rt BPPV posterior canalithiasis; performed 2 reps with no nystagmus and no c/o vertigo in any position of the Epley maneuver   Self Care:  Instructed pt's wife in how to assist in performing Epley at home for self treatment of BPPV prn - pt's wife performed Epley for Rt BPPV with verbal cues for technique and hand placement  Instructed pt to perform ankle pumps bil. LE's 10 reps in seated position prior to standing to assist with reducing severity of light-headedness experienced with sit to stand transfer after  prolonged sitting; pt verbalized and demonstrated understanding    PATIENT EDUCATION: Education details: HEP -  Instructed pt in [place] exercises for self treatment prn Person educated: Patient and Spouse Education method: Explanation, Demonstration, and Handouts Education comprehension: verbalized understanding   GOALS: Goals reviewed with patient? Yes   LONG TERM GOALS: Target date: 10/09/2021    Pt will have (-) Lt horizontal roll test to indicate resolution of Lt BPPV horizontal cupulolithiasis.  Baseline:  Goal status: INITIAL  2.  Pt will perform bed mobility with no c/o dizziness. Baseline:  Goal status: INITIAL  3.  Pt will report no vertigo with any mobility.  Baseline:  Goal status: INITIAL  4.  Independent in HEP for habituation and self treatment of BPPV. Baseline:  Goal status: INITIAL  ASSESSMENT:  CLINICAL IMPRESSION: Pt had no nystagmus and no c/o vertigo on 2nd rep of Epley maneuver for Rt BPPV posterior canalithiasis, indicative of resolution.  Wife was instructed in how to correctly assist/perform Epley for pt at home for self-treatment prn.  Rt BPPV appears to be fully  resolved at this time.  Pt does describe symptoms of light-headedness/dizziness which may be related to orthostatic hypotension.  Pt states he did not take BP medication this morning due to early PT appt time; requests a follow up appt to do orthostatic assessment when BP meds have been taken.  Cont with POC.   OBJECTIVE IMPAIRMENTS decreased balance and dizziness.   ACTIVITY LIMITATIONS carrying, bending, bed mobility, and dressing  PARTICIPATION LIMITATIONS: meal prep, cleaning, laundry, driving, shopping, and community activity  PERSONAL FACTORS 1 comorbidity:    REHAB POTENTIAL: Good  CLINICAL DECISION MAKING: Stable/uncomplicated  EVALUATION COMPLEXITY: Low   PLAN: PT FREQUENCY: 1x/week  PT DURATION: 4 weeks  PLANNED INTERVENTIONS: Therapeutic exercises,  Therapeutic activity, Neuromuscular re-education, Balance training, Gait training, Patient/Family education, and Vestibular training  PLAN FOR NEXT SESSION: do orthostatic assessment   Aalia Greulich, [place], PT 10/03/2021, 10:13 AM

## 2021-10-02 NOTE — Patient Instructions (Addendum)
How to Perform the Epley Maneuver The Epley maneuver is an exercise that relieves symptoms of vertigo. Vertigo is the feeling that you or your surroundings are moving when they are not. When you feel vertigo, you may feel like the room is spinning and may have trouble walking. The Epley maneuver is used for a type of vertigo caused by a calcium deposit in a part of the inner ear. The maneuver involves changing head positions to help the deposit move out of the area. You can do this maneuver at home whenever you have symptoms of vertigo. You can repeat it in 24 hours if your vertigo has not gone away. Even though the Epley maneuver may relieve your vertigo for a few weeks, it is possible that your symptoms will return. This maneuver relieves vertigo, but it does not relieve dizziness. What are the risks? If it is done correctly, the Epley maneuver is considered safe. Sometimes it can lead to dizziness or nausea that goes away after a short time. If you develop other symptoms--such as changes in vision, weakness, or numbness--stop doing the maneuver and call your health care provider. Supplies needed: A bed or table. A pillow. How to do the Epley maneuver     Sit on the edge of a bed or table with your back straight and your legs extended or hanging over the edge of the bed or table. Turn your head halfway toward the affected ear or side as told by your health care provider. Lie backward quickly with your head turned until you are lying flat on your back. Your head should dangle (head-hanging position). You may want to position a pillow under your shoulders. Hold this position for at least 30 seconds. If you feel dizzy or have symptoms of vertigo, continue to hold the position until the symptoms stop. Turn your head to the opposite direction until your unaffected ear is facing down. Your head should continue to dangle. Hold this position for at least 30 seconds. If you feel dizzy or have symptoms  of vertigo, continue to hold the position until the symptoms stop. Turn your whole body to the same side as your head so that you are positioned on your side. Your head will now be nearly facedown and no longer needs to dangle. Hold for at least 30 seconds. If you feel dizzy or have symptoms of vertigo, continue to hold the position until the symptoms stop. Sit back up. You can repeat the maneuver in 24 hours if your vertigo does not go away. Follow these instructions at home: For 24 hours after doing the Epley maneuver: Keep your head in an upright position. When lying down to sleep or rest, keep your head raised (elevated) with two or more pillows. Avoid excessive neck movements. Activity Do not drive or use machinery if you feel dizzy. After doing the Epley maneuver, return to your normal activities as told by your health care provider. Ask your health care provider what activities are safe for you. General instructions Drink enough fluid to keep your urine pale yellow. Do not drink alcohol. Take over-the-counter and prescription medicines only as told by your health care provider. Keep all follow-up visits. This is important. Preventing vertigo symptoms Ask your health care provider if there is anything you should do at home to prevent vertigo. He or she may recommend that you: Keep your head elevated with two or more pillows while you sleep. Do not sleep on the side of your affected ear. Get  up slowly from bed. Avoid sudden movements during the day. Avoid extreme head positions or movement, such as looking up or bending over. Contact a health care provider if: Your vertigo gets worse. You have other symptoms, including: Nausea. Vomiting. Headache. Get help right away if you: Have vision changes. Have a headache or neck pain that is severe or getting worse. Cannot stop vomiting. Have new numbness or weakness in any part of your body. These symptoms may represent a serious  problem that is an emergency. Do not wait to see if the symptoms will go away. Get medical help right away. Call your local emergency services (911 in the [place] Do not drive yourself to the hospital. Summary Vertigo is the feeling that you or your surroundings are moving when they are not. The Epley maneuver is an exercise that relieves symptoms of vertigo. If the Epley maneuver is done correctly, it is considered safe. This information is not intended to replace advice given to you by your health care provider. Make sure you discuss any questions you have with your health care provider. Document Revised: 02/07/2020 Document Reviewed: 02/07/2020 Elsevier Patient Education  [place] for Right Posterior / Anterior Canalithiasis    Sitting on bed: 1. Turn head 45 right. (a) Lie back slowly, shoulders on pillow, head on bed. (b) Hold __30__ seconds. 2. Keeping head on bed, turn head 90 left. Hold __30__ seconds. 3. Roll to left, head on 45 angle down toward bed. Hold __30__ seconds. 4. Sit up on left side of bed. Repeat _3___ times per session. Do _2___ sessions per day.  Copyright  [employer]. All rights reserved.

## 2021-10-03 ENCOUNTER — Encounter: Payer: Self-pay | Admitting: Physical Therapy

## 2021-10-03 DIAGNOSIS — G4700 Insomnia, unspecified: Secondary | ICD-10-CM | POA: Diagnosis not present

## 2021-10-03 DIAGNOSIS — R2689 Other abnormalities of gait and mobility: Secondary | ICD-10-CM | POA: Diagnosis not present

## 2021-10-06 ENCOUNTER — Encounter: Payer: Self-pay | Admitting: Physician Assistant

## 2021-10-06 ENCOUNTER — Ambulatory Visit (INDEPENDENT_AMBULATORY_CARE_PROVIDER_SITE_OTHER): Payer: PPO | Admitting: Physician Assistant

## 2021-10-06 VITALS — BP 141/80 | HR 76 | Resp 18 | Ht 71.0 in | Wt 180.0 lb

## 2021-10-06 DIAGNOSIS — Z79899 Other long term (current) drug therapy: Secondary | ICD-10-CM | POA: Diagnosis not present

## 2021-10-06 DIAGNOSIS — E7800 Pure hypercholesterolemia, unspecified: Secondary | ICD-10-CM | POA: Diagnosis not present

## 2021-10-06 DIAGNOSIS — R7303 Prediabetes: Secondary | ICD-10-CM | POA: Diagnosis not present

## 2021-10-06 DIAGNOSIS — G459 Transient cerebral ischemic attack, unspecified: Secondary | ICD-10-CM | POA: Diagnosis not present

## 2021-10-06 DIAGNOSIS — I10 Essential (primary) hypertension: Secondary | ICD-10-CM | POA: Diagnosis not present

## 2021-10-06 DIAGNOSIS — I639 Cerebral infarction, unspecified: Secondary | ICD-10-CM | POA: Diagnosis not present

## 2021-10-06 NOTE — Progress Notes (Signed)
NEUROLOGY FOLLOW UP OFFICE NOTE  Ruben Barrera [id number]  Assessment/Plan:   Recent TIA presenting as expressive aphasia Right cerebellar peduncle infarct, likely secondary to small vessel disease Hypertension Hyperlipidemia History of left carotid artery disease, with no significant ICA stenosis per carotid ultrasound   Plan Continue Plavix 75 mg daily and statin with Lipitor 20 mg daily and Zetia Continue close monitoring of blood pressure Follow up as needed    Subjective:   Ruben Barrera is an 88 year old man with  a history of hypertension, hyperlipidemia, cigar smoker, lumbar stenosis with chronic back pain and spinal cord stimulator, who follows up for recent stroke. HE denies any recurrent TIA symptoms. He had one episode of benign positional vertigo in 08/2021 improved with vestibular therapy "I am being taught exercises that help, I do them 3 times a day". Denies dizziness or amaurosis fugax. Denies headaches, dysarthria or dysphagia. No confusion or seizures. Denies any chest pain, or shortness of breath. Denies any fever or chills, or night sweats. He takes Plavix daily and denies missing any doses. Patient is very active, exercising at his home gym, for 40 mins.  He has know peripheral neuropathy treated with gabapentin. No back pain at this time.   Initial visitInitially saw him for bilateral lower extremity numbness.  Patient has chronic low back pain and spinal stenosis status post PLIF L4-5 and has had numerous lumbar spine MRIs.  Last MRI on 08/20/2018 personally reviewed showed severe spinal canal and bi foraminal stenosis at L3-4, right foraminal stenosis at L1-2 and PLIF L4-5 without significant stenosis. Following his second COVID vaccine, he developed worsening bilateral sciatic pain and weakness.  He also developed numbness of both feet as well as burning, which is worse when standing on hard surfaces and improved when barefoot or when he is off his feet.  During  that time, he exhibited dizziness and slurred speech and went to the ED on 05/24/2019 where MRI of brain was negative for acute infarct and CTA of head and neck showed no hemodynamically significant stenosis or occlusion.  It was thought to be a reaction to the vaccine.  He had an epidural injection on 05/26/2019 which was ineffective.  He was sent to pain management where he was started on hydrocodone and gabapentin.  He underwent a second injection in early June, which helped the radicular pain but continues to have numbness and burning in the feet.  He reportedly was checked for diabetes and his number was "mildly elevated".  Neuropathy labs from June 2021 included negative ANA, sed rate 5, B12 398, folate >24.8, SPEP/IFE negative for monoclonal protein.  NCV-EMG in August showed chronic bilateral L5-S1 radiculopathy and chronic right L3-4 radiculopathy with no evidence of large fiber sensorimotor polyneuropathy.   He was admitted to [facility] on 02/20/2020 for cerebellar stroke, presenting with recurrent double vision and difficulty writing his name.  CT of head showed hypodensity in the right cerebellar peduncle.  Follow up MRI of brain showed possible subacute rounded infarct in the right cerebellar peduncle.  MRI of brain with contrast demonstrated no abnormal enhancement.  CTA of head and neck showed 70% stenosis at the left ICA origin and high-grade right P2 and left P3 stenosis.  2D echocardiogram showed EF 30-35% with global hypokinesis.  LDL was 122 and Hgb A1c 5.6.  He was started on ASA '81mg'$  and Plavix '75mg'$  daily for 3 weeks followed by ASA alone.  Was on Zetia.  Unable to tolerate simvastatin.  In hospital, low-dose pravastatin '20mg'$  was added.  Cardiology consulted for ischemic workup and outpatient cardiac monitoring to rule out a fib.  Vascular surgery and cardiology follow up was ordered.  Cardiac event monitor was negative for atrial fibrillation.  Overall, symptoms have resolved.  During  the day, when he is tired, he may slur his speech and feel a little more off-balance.   Update 04/08/2021 On 10/07/2020, he was unable to get words out/effortful speech for about 10 minutes.  No slurred speech, facial droop or unilateral weakness or numbness.  He reports that he was under some stress and had also overexerted himself exercising on the treadmill.  ASA was changed to Plavix.  Carotid ultrasound on 10/08/2020 showed 1-39% stenosis of the bilateral ICAs, antegrade flow of vertebral artery bilaterally.  Currently feeling well.  And he denies any new TIA or stroke symptoms. Since his last visit, the patient was admitted in the hospital for sepsis due to Streptococcus intermedius bacteremia, requiring ID involvement, TEE showed findings concerning for endocarditis, he was placed on 6 weeks of IV antibiotics and a peak line prior to discharge, he is due for his last dose of vancomycin tomorrow.  During that admission, he also had acute hypoxic respiratory failure due to probably acute on chronic CHF, acute kidney injury, which would required ICU placement.  Upon discharge, he is being followed by cardiology.  PAST MEDICAL HISTORY: Past Medical History:  Diagnosis Date   Arthritis    Coronary artery disease    Depression    Dysrhythmia    "extra heart beat"   GERD (gastroesophageal reflux disease)    History of hiatal hernia    Hypercholesteremia    Hypertension    Lumbar spinal stenosis    RLS (restless legs syndrome)    takes ativan as needed   Stroke ([place]) 01/2020   minor - no deficits    MEDICATIONS: Current Outpatient Medications on File Prior to Visit  Medication Sig Dispense Refill   amoxicillin (AMOXIL) 500 MG capsule Take 2,000 mg by mouth See admin instructions. Take 1 hour for dental procedures     celecoxib (CELEBREX) 200 MG capsule Take 200 mg by mouth daily.     ciclopirox (LOPROX) 0.77 % cream Apply 1 application topically daily.     citalopram (CELEXA) 20 MG tablet  Take 20 mg by mouth daily.     clopidogrel (PLAVIX) 75 MG tablet Take 1 tablet (75 mg total) by mouth daily. 30 tablet 5   doxycycline (VIBRA-TABS) 100 MG tablet Take 1 tablet (100 mg total) by mouth 2 (two) times daily. (Patient not taking: Reported on 05/21/2021) 20 tablet 0   ezetimibe (ZETIA) 10 MG tablet Take 1 tablet (10 mg total) by mouth every evening. 30 tablet 0   fluticasone (FLONASE) 50 MCG/ACT nasal spray Place 1 spray into both nostrils daily as needed for allergies.      furosemide (LASIX) 20 MG tablet Take 1 tablet (20 mg total) by mouth every other day. 45 tablet 3   gabapentin (NEURONTIN) 400 MG capsule Take 400 mg by mouth at bedtime.     gabapentin (NEURONTIN) 600 MG tablet Take 1 tablet (600 mg total) by mouth 3 (three) times daily. (Patient taking differently: Take 600 mg by mouth 2 (two) times daily. Throughout the day, NOT at bedtime) 90 tablet 0   HYDROcodone-acetaminophen (NORCO) 7.5-325 MG tablet Take 1 tablet by mouth every 8 (eight) hours as needed for moderate pain. 20 tablet 0  Magnesium 250 MG TABS Take 250 mg by mouth daily in the afternoon.     MULTIPLE VITAMIN PO Take 1 tablet by mouth daily.      omeprazole (PRILOSEC) 20 MG capsule Take 20 mg by mouth daily.     pravastatin (PRAVACHOL) 20 MG tablet Take 1 tablet (20 mg total) by mouth daily at 6 PM. 30 tablet 0   saccharomyces boulardii (FLORASTOR) 250 MG capsule Take 250 mg by mouth daily in the afternoon.     senna-docusate (SENOKOT-S) 8.6-50 MG tablet Take 1 tablet by mouth 2 (two) times daily.     tamsulosin (FLOMAX) 0.4 MG CAPS capsule Take 1 capsule (0.4 mg total) by mouth daily. 30 capsule 0   traZODone (DESYREL) 50 MG tablet Take 25 mg by mouth at bedtime as needed for sleep.     triamcinolone (KENALOG) 0.1 % Apply 1 application topically daily as needed (irritation).     No current facility-administered medications on file prior to visit.    ALLERGIES: Allergies  Allergen Reactions   Simvastatin  Other (See Comments)    Leg pains    Atorvastatin Other (See Comments)    Muscle aches   Benazepril Hcl Other (See Comments)    Pt does not know reaction    Losartan Other (See Comments)    Pt does not have reaction    Pravastatin Other (See Comments)    Leg pains    Rosuvastatin Other (See Comments)    Leg pains    Spironolactone Other (See Comments)    Pt does not reaction     FAMILY HISTORY: Family History  Problem Relation Age of Onset   Stroke Mother    Hypertension Other        family history      Objective:   General: No acute distress.  Patient appears well  groomed.   Head:  Normocephalic/atraumatic Eyes:  Fundi examined but not visualized Neck: supple, no paraspinal tenderness, full range of motion Heart:  Regular rate and rhythm Lungs:  Clear to auscultation bilaterally Back: No paraspinal tenderness Neurological Exam: alert and oriented to person, place, and time. Attention span and concentration intact, recent and remote memory intact, fund of knowledge intact.  Speech fluent and not dysarthric, language intact.  CN II-XII intact. Bulk and tone normal, muscle strength 5/5 throughout.  Sensation to light touch, temperature and vibration intact.  Deep tendon reflexes 2+ throughout, toes downgoing.  Finger to nose and heel to shin testing intact.  Wide based gait, Romberg negative.He uses a right cane to ambulate   Sharene Butters, PA-C   CC: Kathalene Frames, MD

## 2021-10-06 NOTE — Patient Instructions (Addendum)
Continue clopidogrel '75mg'$  daily.  Otherwise, no change in medications follow up as needed

## 2021-10-10 ENCOUNTER — Ambulatory Visit
Admission: RE | Admit: 2021-10-10 | Discharge: 2021-10-10 | Disposition: A | Discharge status: Home / Self Care | Payer: PPO | Source: Ambulatory Visit | Attending: Internal Medicine | Admitting: Internal Medicine

## 2021-10-10 ENCOUNTER — Other Ambulatory Visit: Payer: Self-pay | Admitting: Internal Medicine

## 2021-10-10 ENCOUNTER — Telehealth: Payer: Self-pay | Admitting: Internal Medicine

## 2021-10-10 DIAGNOSIS — R0602 Shortness of breath: Secondary | ICD-10-CM

## 2021-10-10 DIAGNOSIS — F419 Anxiety disorder, unspecified: Secondary | ICD-10-CM | POA: Diagnosis not present

## 2021-10-10 DIAGNOSIS — I10 Essential (primary) hypertension: Secondary | ICD-10-CM | POA: Diagnosis not present

## 2021-10-10 DIAGNOSIS — G4700 Insomnia, unspecified: Secondary | ICD-10-CM | POA: Diagnosis not present

## 2021-10-10 DIAGNOSIS — Z03818 Encounter for observation for suspected exposure to other biological agents ruled out: Secondary | ICD-10-CM | POA: Diagnosis not present

## 2021-10-10 NOTE — Telephone Encounter (Signed)
Called pt to inquire metoprolol dose. Reports takes metoprolol 25 mg PO QD. Added this to medication list.

## 2021-10-10 NOTE — Telephone Encounter (Signed)
Patient called stating his PCP doubled up his lasix every other everyday. The PCP also suggested he take an anxiety pill. Patient called to let us know.

## 2021-10-16 ENCOUNTER — Encounter: Payer: Self-pay | Admitting: Podiatry

## 2021-10-16 ENCOUNTER — Ambulatory Visit (INDEPENDENT_AMBULATORY_CARE_PROVIDER_SITE_OTHER): Payer: PPO | Admitting: Podiatry

## 2021-10-16 DIAGNOSIS — B351 Tinea unguium: Secondary | ICD-10-CM | POA: Diagnosis not present

## 2021-10-16 DIAGNOSIS — M79675 Pain in left toe(s): Secondary | ICD-10-CM

## 2021-10-16 DIAGNOSIS — M79674 Pain in right toe(s): Secondary | ICD-10-CM | POA: Diagnosis not present

## 2021-10-16 NOTE — Progress Notes (Signed)
**Note Ruben-Identified via Obfuscation** Subjective:   Patient ID: Ruben Barrera, male   DOB: 88 y.o.   MRN: [id number]   HPI Patient states the capsule of his right ankle is feeling a lot better and now his nails are increasingly sore thick he cannot cut them and they become painful    ROS      Objective:  Physical Exam  Neurovascular status intact with patient found to have thick deformed nailbeds 1-5 both feet that are incurvated and painful when pressed.     Assessment:  Mycotic nail infection with pain 1-5 both feet left      Plan:  Debrided painful nailbeds 1-5 both feet no angiogenic bleeding reappoint routine care

## 2021-10-17 ENCOUNTER — Observation Stay (HOSPITAL_BASED_OUTPATIENT_CLINIC_OR_DEPARTMENT_OTHER)
Admission: EM | Admit: 2021-10-17 | Discharge: 2021-10-18 | Disposition: A | Discharge status: Home / Self Care | Payer: PPO | Attending: Emergency Medicine | Admitting: Emergency Medicine

## 2021-10-17 ENCOUNTER — Emergency Department (HOSPITAL_BASED_OUTPATIENT_CLINIC_OR_DEPARTMENT_OTHER): Payer: PPO

## 2021-10-17 ENCOUNTER — Encounter (HOSPITAL_BASED_OUTPATIENT_CLINIC_OR_DEPARTMENT_OTHER): Payer: Self-pay

## 2021-10-17 ENCOUNTER — Other Ambulatory Visit: Payer: Self-pay

## 2021-10-17 DIAGNOSIS — Z20822 Contact with and (suspected) exposure to covid-19: Secondary | ICD-10-CM | POA: Diagnosis not present

## 2021-10-17 DIAGNOSIS — I5021 Acute systolic (congestive) heart failure: Secondary | ICD-10-CM | POA: Diagnosis not present

## 2021-10-17 DIAGNOSIS — I493 Ventricular premature depolarization: Secondary | ICD-10-CM | POA: Diagnosis not present

## 2021-10-17 DIAGNOSIS — I5023 Acute on chronic systolic (congestive) heart failure: Secondary | ICD-10-CM | POA: Diagnosis not present

## 2021-10-17 DIAGNOSIS — I5033 Acute on chronic diastolic (congestive) heart failure: Secondary | ICD-10-CM | POA: Diagnosis present

## 2021-10-17 DIAGNOSIS — I2510 Atherosclerotic heart disease of native coronary artery without angina pectoris: Secondary | ICD-10-CM | POA: Diagnosis present

## 2021-10-17 DIAGNOSIS — F419 Anxiety disorder, unspecified: Secondary | ICD-10-CM | POA: Diagnosis not present

## 2021-10-17 DIAGNOSIS — I10 Essential (primary) hypertension: Secondary | ICD-10-CM | POA: Diagnosis present

## 2021-10-17 DIAGNOSIS — R0602 Shortness of breath: Secondary | ICD-10-CM | POA: Diagnosis not present

## 2021-10-17 DIAGNOSIS — E7800 Pure hypercholesterolemia, unspecified: Secondary | ICD-10-CM | POA: Diagnosis present

## 2021-10-17 DIAGNOSIS — R9431 Abnormal electrocardiogram [ECG] [EKG]: Secondary | ICD-10-CM | POA: Diagnosis not present

## 2021-10-17 DIAGNOSIS — Z79899 Other long term (current) drug therapy: Secondary | ICD-10-CM | POA: Diagnosis not present

## 2021-10-17 DIAGNOSIS — G629 Polyneuropathy, unspecified: Secondary | ICD-10-CM | POA: Insufficient documentation

## 2021-10-17 DIAGNOSIS — N1831 Chronic kidney disease, stage 3a: Secondary | ICD-10-CM | POA: Diagnosis present

## 2021-10-17 DIAGNOSIS — J9601 Acute respiratory failure with hypoxia: Secondary | ICD-10-CM | POA: Insufficient documentation

## 2021-10-17 DIAGNOSIS — J189 Pneumonia, unspecified organism: Secondary | ICD-10-CM | POA: Diagnosis not present

## 2021-10-17 DIAGNOSIS — Z96653 Presence of artificial knee joint, bilateral: Secondary | ICD-10-CM | POA: Diagnosis not present

## 2021-10-17 DIAGNOSIS — J810 Acute pulmonary edema: Secondary | ICD-10-CM | POA: Diagnosis not present

## 2021-10-17 DIAGNOSIS — F17290 Nicotine dependence, other tobacco product, uncomplicated: Secondary | ICD-10-CM | POA: Diagnosis not present

## 2021-10-17 DIAGNOSIS — Z7902 Long term (current) use of antithrombotics/antiplatelets: Secondary | ICD-10-CM | POA: Insufficient documentation

## 2021-10-17 DIAGNOSIS — G4700 Insomnia, unspecified: Secondary | ICD-10-CM | POA: Diagnosis not present

## 2021-10-17 DIAGNOSIS — I509 Heart failure, unspecified: Secondary | ICD-10-CM

## 2021-10-17 DIAGNOSIS — N400 Enlarged prostate without lower urinary tract symptoms: Secondary | ICD-10-CM | POA: Insufficient documentation

## 2021-10-17 DIAGNOSIS — R0981 Nasal congestion: Secondary | ICD-10-CM | POA: Diagnosis not present

## 2021-10-17 DIAGNOSIS — I5043 Acute on chronic combined systolic (congestive) and diastolic (congestive) heart failure: Principal | ICD-10-CM | POA: Insufficient documentation

## 2021-10-17 DIAGNOSIS — F322 Major depressive disorder, single episode, severe without psychotic features: Secondary | ICD-10-CM | POA: Diagnosis not present

## 2021-10-17 DIAGNOSIS — R778 Other specified abnormalities of plasma proteins: Secondary | ICD-10-CM | POA: Insufficient documentation

## 2021-10-17 DIAGNOSIS — Z8673 Personal history of transient ischemic attack (TIA), and cerebral infarction without residual deficits: Secondary | ICD-10-CM | POA: Diagnosis not present

## 2021-10-17 DIAGNOSIS — I639 Cerebral infarction, unspecified: Secondary | ICD-10-CM | POA: Diagnosis present

## 2021-10-17 DIAGNOSIS — I130 Hypertensive heart and chronic kidney disease with heart failure and stage 1 through stage 4 chronic kidney disease, or unspecified chronic kidney disease: Secondary | ICD-10-CM | POA: Insufficient documentation

## 2021-10-17 DIAGNOSIS — R0902 Hypoxemia: Secondary | ICD-10-CM | POA: Diagnosis not present

## 2021-10-17 LAB — CBC
HCT: 43.7 % (ref 39.0–52.0)
Hemoglobin: 14.5 g/dL (ref 13.0–17.0)
MCH: 31.7 pg (ref 26.0–34.0)
MCHC: 33.2 g/dL (ref 30.0–36.0)
MCV: 95.6 fL (ref 80.0–100.0)
Platelets: 231 10*3/uL (ref 150–400)
RBC: 4.57 MIL/uL (ref 4.22–5.81)
RDW: 14 % (ref 11.5–15.5)
WBC: 7.6 10*3/uL (ref 4.0–10.5)
nRBC: 0 % (ref 0.0–0.2)

## 2021-10-17 LAB — BASIC METABOLIC PANEL
Anion gap: 10 (ref 5–15)
BUN: 26 mg/dL — ABNORMAL HIGH (ref 8–23)
CO2: 22 mmol/L (ref 22–32)
Calcium: 8.9 mg/dL (ref 8.9–10.3)
Chloride: 105 mmol/L (ref 98–111)
Creatinine, Ser: 1.25 mg/dL — ABNORMAL HIGH (ref 0.61–1.24)
GFR, Estimated: 56 mL/min — ABNORMAL LOW (ref >60)
Glucose, Bld: 104 mg/dL — ABNORMAL HIGH (ref 70–99)
Potassium: 4.5 mmol/L (ref 3.5–5.1)
Sodium: 137 mmol/L (ref 135–145)

## 2021-10-17 LAB — HEPATIC FUNCTION PANEL
ALT: 28 U/L (ref 0–44)
AST: 33 U/L (ref 15–41)
Albumin: 4.5 g/dL (ref 3.5–5.0)
Alkaline Phosphatase: 56 U/L (ref 38–126)
Bilirubin, Direct: 0.2 mg/dL (ref 0.0–0.2)
Indirect Bilirubin: 0.8 mg/dL (ref 0.3–0.9)
Total Bilirubin: 1 mg/dL (ref 0.3–1.2)
Total Protein: 6.8 g/dL (ref 6.5–8.1)

## 2021-10-17 LAB — RESP PANEL BY RT-PCR (FLU A&B, COVID) ARPGX2
Influenza A by PCR: NEGATIVE
Influenza B by PCR: NEGATIVE
SARS Coronavirus 2 by RT PCR: NEGATIVE

## 2021-10-17 LAB — BRAIN NATRIURETIC PEPTIDE: B Natriuretic Peptide: 1366.3 pg/mL — ABNORMAL HIGH (ref 0.0–100.0)

## 2021-10-17 LAB — TROPONIN I (HIGH SENSITIVITY)
Troponin I (High Sensitivity): 22 ng/L — ABNORMAL HIGH (ref <18)
Troponin I (High Sensitivity): 21 ng/L — ABNORMAL HIGH (ref <18)

## 2021-10-17 MED ORDER — FUROSEMIDE 10 MG/ML IJ SOLN
40.0000 mg | Freq: Once | INTRAMUSCULAR | Status: AC
  Administered 2021-10-17: 40 mg via INTRAVENOUS
  Filled 2021-10-17: qty 4

## 2021-10-17 MED ORDER — POLYETHYLENE GLYCOL 3350 17 G PO PACK: 17.0000 g | PACK | Freq: Every day | ORAL | Status: DC | PRN

## 2021-10-17 MED ORDER — PROCHLORPERAZINE EDISYLATE 10 MG/2ML IJ SOLN
10.0000 mg | Freq: Four times a day (QID) | INTRAMUSCULAR | Status: DC | PRN
Start: 2021-10-17 — End: 2021-10-18

## 2021-10-17 MED ORDER — FLUTICASONE PROPIONATE 50 MCG/ACT NA SUSP
1.0000 | Freq: Every day | NASAL | Status: DC | PRN
Start: 2021-10-17 — End: 2021-10-18

## 2021-10-17 MED ORDER — CLOPIDOGREL BISULFATE 75 MG PO TABS
75.0000 mg | ORAL_TABLET | Freq: Every day | ORAL | Status: DC
  Administered 2021-10-18: 75 mg via ORAL
  Filled 2021-10-17: qty 1

## 2021-10-17 MED ORDER — CITALOPRAM HYDROBROMIDE 20 MG PO TABS
20.0000 mg | ORAL_TABLET | Freq: Every day | ORAL | Status: DC
  Administered 2021-10-18: 20 mg via ORAL
  Filled 2021-10-17: qty 1

## 2021-10-17 MED ORDER — ADULT MULTIVITAMIN W/MINERALS CH
1.0000 | ORAL_TABLET | Freq: Every day | ORAL | Status: DC
  Administered 2021-10-18: 1 via ORAL
  Filled 2021-10-17: qty 1

## 2021-10-17 MED ORDER — EZETIMIBE 10 MG PO TABS: 10.0000 mg | ORAL_TABLET | Freq: Every evening | ORAL | Status: DC

## 2021-10-17 MED ORDER — GABAPENTIN 400 MG PO CAPS
800.0000 mg | ORAL_CAPSULE | Freq: Every day | ORAL | Status: DC
  Administered 2021-10-17: 800 mg via ORAL
  Filled 2021-10-17: qty 2

## 2021-10-17 MED ORDER — PANTOPRAZOLE SODIUM 40 MG PO TBEC
40.0000 mg | DELAYED_RELEASE_TABLET | Freq: Every day | ORAL | Status: DC
  Administered 2021-10-17 – 2021-10-18 (×2): 40 mg via ORAL
  Filled 2021-10-17 (×2): qty 1

## 2021-10-17 MED ORDER — ENOXAPARIN SODIUM 40 MG/0.4ML IJ SOSY
40.0000 mg | PREFILLED_SYRINGE | INTRAMUSCULAR | Status: DC
Start: 2021-10-17 — End: 2021-10-18
  Administered 2021-10-17: 40 mg via SUBCUTANEOUS
  Filled 2021-10-17: qty 0.4

## 2021-10-17 MED ORDER — PRAVASTATIN SODIUM 10 MG PO TABS: 20.0000 mg | ORAL_TABLET | Freq: Every day | ORAL | Status: DC

## 2021-10-17 MED ORDER — FUROSEMIDE 10 MG/ML IJ SOLN
40.0000 mg | Freq: Every day | INTRAMUSCULAR | Status: DC
Start: 2021-10-17 — End: 2021-10-18
  Administered 2021-10-17: 40 mg via INTRAVENOUS
  Filled 2021-10-17: qty 4

## 2021-10-17 MED ORDER — MAGNESIUM OXIDE -MG SUPPLEMENT 400 (240 MG) MG PO TABS
200.0000 mg | ORAL_TABLET | Freq: Every day | ORAL | Status: DC
  Administered 2021-10-18: 200 mg via ORAL
  Filled 2021-10-17: qty 1

## 2021-10-17 MED ORDER — MELATONIN 5 MG PO TABS
5.0000 mg | ORAL_TABLET | Freq: Every evening | ORAL | Status: DC | PRN
  Administered 2021-10-17: 5 mg via ORAL
  Filled 2021-10-17: qty 1

## 2021-10-17 MED ORDER — ACETAMINOPHEN 325 MG PO TABS
650.0000 mg | ORAL_TABLET | Freq: Four times a day (QID) | ORAL | Status: DC | PRN
  Administered 2021-10-17: 650 mg via ORAL
  Filled 2021-10-17: qty 2

## 2021-10-17 MED ORDER — TAMSULOSIN HCL 0.4 MG PO CAPS
0.4000 mg | ORAL_CAPSULE | Freq: Every day | ORAL | Status: DC
  Administered 2021-10-18: 0.4 mg via ORAL
  Filled 2021-10-17: qty 1

## 2021-10-17 NOTE — ED Notes (Signed)
[facility] called for Transportation at this Time.

## 2021-10-17 NOTE — ED Notes (Signed)
[facility] at bedside 

## 2021-10-17 NOTE — ED Notes (Signed)
Report called to [place].  States will call back for any questions

## 2021-10-17 NOTE — ED Notes (Signed)
This RT walked with [name] around the nurses station.  He dropped to 88% briefly and recovered quickly to 94%.  He is SOB with a RR=22, but denies feeling SOB.

## 2021-10-17 NOTE — ED Triage Notes (Signed)
Patient here POV from PCP Office.  Endorses SOB for Approximately SOB and states it may have worsened since it began.   Possible Mild Cough. No Known Fevers. No Pain.   Visited PCP today and sent for Evaluation. States SPO2 dropped to 82% upon Ambulation.   NAD Noted during Triage. A&Ox4. GCS 15. Ambulatory.

## 2021-10-17 NOTE — ED Provider Notes (Signed)
[facility] EMERGENCY DEPT Provider Note   CSN: [id number] Arrival date & time: 10/17/21  1315     History {Add pertinent medical, surgical, social history, OB history to HPI:1} Chief Complaint  Patient presents with   Shortness of Breath    Ruben Barrera is a 88 y.o. male with past medical history of CHF with most recent echo 05/28/2021 with an EF of 30%, stroke, polyneuropathy, endocarditis in December 2022 from dental procedure, who presents today for evaluation of shortness of breath. He was seen by his PCP on 7/21, at that point he had reportedly a chest x-ray showing pulmonary congestion and possible pneumonia. He was placed on doxycycline 100 mg for 5 days and his furosemide was increased from 20 mg daily to 40 mg every other day.  He states that the rattle that he was hearing in his chest improved after that treatment but he still feels short of breath. Patient states that he walks on the treadmill or uses a bicycle for 40+ minutes multiple times a week and normally after that when he checks his oxygen at in the high 90s. According to notes sent with patient from the PCP at rest he was 90% and when he ambulated in the clinic he dropped down to 81%.  Patient denies any leg swelling.  He denies any fevers.  He denies any known sick contacts.  HPI     Home Medications Prior to Admission medications   Medication Sig Start Date End Date Taking? Authorizing Provider  amoxicillin (AMOXIL) 500 MG capsule Take 2,000 mg by mouth See admin instructions. Take 1 hour for dental procedures Patient not taking: Reported on 10/06/2021 04/02/21   [provider]  celecoxib (CELEBREX) 200 MG capsule Take 200 mg by mouth daily. 03/15/20   [provider]  ciclopirox (LOPROX) 0.77 % cream Apply 1 application topically daily. 04/28/21   [provider]  citalopram (CELEXA) 20 MG tablet Take 20 mg by mouth daily.    [provider]  clopidogrel  (PLAVIX) 75 MG tablet Take 1 tablet (75 mg total) by mouth daily. 04/08/21   Rondel Jumbo, PA-C  doxycycline (VIBRA-TABS) 100 MG tablet Take 1 tablet (100 mg total) by mouth 2 (two) times daily. Patient not taking: Reported on 05/21/2021 04/11/21   Rosiland Oz, MD  ezetimibe (ZETIA) 10 MG tablet Take 1 tablet (10 mg total) by mouth every evening. 02/26/20   Angiulli, Lavon Paganini, PA-C  fluticasone (FLONASE) 50 MCG/ACT nasal spray Place 1 spray into both nostrils daily as needed for allergies.     [provider]  furosemide (LASIX) 20 MG tablet Take 1 tablet (20 mg total) by mouth every other day. Patient taking differently: Take 20 mg by mouth every other day. Per pt call in PCP increased furosemide to 40 mg PO BID QOD. 04/18/21   Chandrasekhar, Mahesh A, MD  gabapentin (NEURONTIN) 400 MG capsule Take 400 mg by mouth at bedtime. Patient not taking: Reported on 10/06/2021    [provider]  gabapentin (NEURONTIN) 600 MG tablet Take 1 tablet (600 mg total) by mouth 3 (three) times daily. Patient taking differently: Take 600 mg by mouth 2 (two) times daily. Throughout the day, NOT at bedtime(Currently taken ONE tab at HS) 02/26/20   Angiulli, Lavon Paganini, PA-C  HYDROcodone-acetaminophen (NORCO) 7.5-325 MG tablet Take 1 tablet by mouth every 8 (eight) hours as needed for moderate pain. 02/26/20   Angiulli, Lavon Paganini, PA-C  Magnesium 250 MG TABS Take  250 mg by mouth daily in the afternoon.    [provider]  metoprolol succinate (TOPROL-XL) 25 MG 24 hr tablet Take 12.5 mg by mouth daily.    [provider]  MULTIPLE VITAMIN PO Take 1 tablet by mouth daily.     [provider]  omeprazole (PRILOSEC) 20 MG capsule Take 20 mg by mouth daily.    [provider]  pravastatin (PRAVACHOL) 20 MG tablet Take 1 tablet (20 mg total) by mouth daily at 6 PM. 02/26/20   Angiulli, Lavon Paganini, PA-C  saccharomyces boulardii (FLORASTOR) 250 MG capsule Take 250 mg by mouth  daily in the afternoon. 05/06/18   [provider]  senna-docusate (SENOKOT-S) 8.6-50 MG tablet Take 1 tablet by mouth 2 (two) times daily. 02/26/20   Angiulli, Lavon Paganini, PA-C  tamsulosin (FLOMAX) 0.4 MG CAPS capsule Take 1 capsule (0.4 mg total) by mouth daily. 02/26/20   Angiulli, Lavon Paganini, PA-C  traZODone (DESYREL) 50 MG tablet Take 25 mg by mouth at bedtime as needed for sleep. 03/07/21   [provider]  triamcinolone (KENALOG) 0.1 % Apply 1 application topically daily as needed (irritation).    [provider]      Allergies    Simvastatin, Atorvastatin, Benazepril hcl, Losartan, Pravastatin, Rosuvastatin, and Spironolactone    Review of Systems   Review of Systems  Physical Exam Updated Vital Signs BP 126/74 (BP Location: Right Arm)   Pulse 83   Temp 98.1 F (36.7 C)   Resp (!) 22   Ht '5\' 11"'$  (1.803 m)   Wt 81.6 kg   SpO2 96%   BMI 25.09 kg/m  Physical Exam Vitals and nursing note reviewed.  Constitutional:      General: He is not in acute distress.    Appearance: He is not ill-appearing.  HENT:     Head: Normocephalic and atraumatic.  Eyes:     Conjunctiva/sclera: Conjunctivae normal.  Cardiovascular:     Rate and Rhythm: Normal rate and regular rhythm.     Pulses: Normal pulses.     Heart sounds: Murmur heard.  Pulmonary:     Effort: Pulmonary effort is normal. No respiratory distress.     Breath sounds: Examination of the right-middle field reveals rales. Examination of the left-middle field reveals rales. Examination of the right-lower field reveals rales. Examination of the left-lower field reveals rales. Rales present.  Chest:     Chest wall: No tenderness.  Abdominal:     General: There is no distension.     Palpations: Abdomen is soft.  Musculoskeletal:     Cervical back: Normal range of motion and neck supple.     Right lower leg: No tenderness. No edema.     Left lower leg: No tenderness. No edema.     Comments: No obvious  acute injury  Skin:    General: Skin is warm and dry.  Neurological:     Mental Status: He is alert.     Comments: Awake and alert, answers all questions appropriately.  Speech is not slurred.  Psychiatric:        Mood and Affect: Mood normal.        Behavior: Behavior normal.     ED Results / Procedures / Treatments   Labs (all labs ordered are listed, but only abnormal results are displayed) Labs Reviewed  BASIC METABOLIC PANEL - Abnormal; Notable for the following components:      Result Value   Glucose, Bld 104 (*)  BUN 26 (*)    Creatinine, Ser 1.25 (*)    GFR, Estimated 56 (*)    All other components within normal limits  RESP PANEL BY RT-PCR (FLU A&B, COVID) ARPGX2  CBC  HEPATIC FUNCTION PANEL  BRAIN NATRIURETIC PEPTIDE  TROPONIN I (HIGH SENSITIVITY)    EKG EKG Interpretation  Date/Time:  Friday October 17 2021 13:26:07 EDT Ventricular Rate:  84 PR Interval:  140 QRS Duration: 110 QT Interval:  422 QTC Calculation: 498 R Axis:   -43 Text Interpretation: Sinus rhythm with frequent Premature ventricular complexes Left axis deviation When compared with ECG of 27-Feb-2021 16:57, Sinus rhythm has replaced Ectopic atrial rhythm QRS axis Shifted right Confirmed by Lennice Sites ([id number]) on 10/17/2021 2:12:55 PM  Radiology DG Chest Portable 1 View  Result Date: 10/17/2021 CLINICAL DATA:  Shortness of breath EXAM: PORTABLE CHEST 1 VIEW COMPARISON:  Chest x-ray dated July 21st 2023 FINDINGS: Unchanged cardiac and mediastinal contours. Spinal stimulator device lead overlying the thoracic spine. Mild bilateral heterogeneous opacities, similar to prior exam. Ill-defined right hemidiaphragm which is likely due to atelectasis. No large pleural effusion or pneumothorax. IMPRESSION: 1. Mild bilateral heterogeneous opacities, similar to prior exam and likely due to pulmonary edema. 2. Right basilar atelectasis. Electronically Signed   By: Yetta Glassman M.D.   On: 10/17/2021 14:30     Procedures Procedures  {Document cardiac monitor, telemetry assessment procedure when appropriate:1}  Medications Ordered in ED Medications - No data to display  ED Course/ Medical Decision Making/ A&P Clinical Course as of 10/17/21 1603  Fri Oct 17, 2021  1452 I personally witnessed patient ambulating in the hallway with RT.  He dropped to 88% on room air and became symptomatically short  of breath. [EH]  1453 B Natriuretic Peptide(!): 1,366.3 6 months ago was in the 600s.  This is despite increased Lasix at home for 1 week. [EH]  1601 Creatinine(!): 1.25 Baseline [EH]  1601 CBC No anemia nor leukocytosis [EH]  1602 DG Chest Portable 1 View Bilateral changes consistent with pulmonary edema.  [EH]    Clinical Course User Index [EH] Lorin Glass, PA-C                           Medical Decision Making Amount and/or Complexity of Data Reviewed Labs: ordered. Decision-making details documented in ED Course. Radiology: ordered. Decision-making details documented in ED Course.   ***  {Document critical care time when appropriate:1} {Document review of labs and clinical decision tools ie heart score, Chads2Vasc2 etc:1}  {Document your independent review of radiology images, and any outside records:1} {Document your discussion with family members, caretakers, and with consultants:1} {Document social determinants of health affecting pt's care:1} {Document your decision making why or why not admission, treatments were needed:1} Final Clinical Impression(s) / ED Diagnoses Final diagnoses:  None    Rx / DC Orders ED Discharge Orders     None

## 2021-10-17 NOTE — H&P (Signed)
History and Physical  Ruben Barrera PIR:[id number] DOB: [birth year] DOA: 10/17/2021  Referring physician: Accepted by Dr. Roosevelt Locks, [facility], Hospitalist service.  PCP: Kathalene Frames, MD  Outpatient Specialists: Cardiology, neurology. Patient coming from: Home through [facility] ED.  Chief Complaint: Shortness of breath.  HPI: Ruben Barrera is a 88 y.o. male with medical history significant for essential hypertension, hyperlipidemia, coronary artery disease, history of endocarditis 6 months ago, recent TIA (presented with expressive aphasia), right cerebellar peduncle infarct, history of carotid artery disease with no significant ICA stenosis, who initially presented to [facility] ED with complaints of progressively worsening dyspnea with minimal exertion of 2 weeks duration.  Also endorses audible wheezing.  No chest pain.    He presented to his primary care provider today due to his symptomatology.  While at his PCPs office he was noted to be hypoxic with O2 saturation of 82% on room air upon ambulation.  He was advised to go to the ED for further evaluation.    In the ED, O2 saturation improved on 2 L nasal cannula to the mid 90s.  Chest x-ray remarkable for mild pulmonary edema, BNP greater than 1300.  [facility], hospitalist service, was asked to admit for acute on chronic HFrEF.  The patient was accepted by Dr. Roosevelt Locks, [facility], as a direct admit to [facility] telemetry medical unit, as observation status.    At the time of this visit, the patient reports his shortness of breath is improved but persistent.  Not on oxygen supplementation at baseline and currently on 2 L to maintain O2 saturation greater than 92%.  Denies chest pain or palpitations at the time of this exam.  Frequent PVCs noted on the monitor in the room.  Twelve-lead EKG ordered and is pending.  Denies subjective fevers.  ED Course: Tmax 98.1.  BP 149/103, pulse 57, respiratory rate 20, O2 saturation 97% on 2 L.  Lab studies remarkable for BUN  26, creatinine 1.25, baseline creatinine of 56.  BNP 1366.3, high-sensitivity troponin 22, 21.  CBC essentially unremarkable.  Review of Systems: Review of systems as noted in the HPI. All other systems reviewed and are negative.   Past Medical History:  Diagnosis Date   Arthritis    Coronary artery disease    Depression    Dysrhythmia    "extra heart beat"   GERD (gastroesophageal reflux disease)    History of hiatal hernia    Hypercholesteremia    Hypertension    Lumbar spinal stenosis    RLS (restless legs syndrome)    takes ativan as needed   Stroke ([place]) 01/2020   minor - no deficits   Past Surgical History:  Procedure Laterality Date   BACK SURGERY     COLONOSCOPY     EYE SURGERY Bilateral    cataracts removed    LUMBAR LAMINECTOMY/DECOMPRESSION MICRODISCECTOMY N/A 08/28/2015   Procedure: Lumbar Four-Five decompressive lumbar laminectomy;  Surgeon: Jovita Gamma, MD;  Location: [facility];  Service: Neurosurgery;  Laterality: N/A;   REPLACEMENT TOTAL KNEE BILATERAL Bilateral 11/05/2003   RIGHT/LEFT HEART CATH AND CORONARY ANGIOGRAPHY N/A 03/28/2020   Procedure: RIGHT/LEFT HEART CATH AND CORONARY ANGIOGRAPHY;  Surgeon: Burnell Blanks, MD;  Location: [facility] CV LAB;  Service: Cardiovascular;  Laterality: N/A;   TEE WITHOUT CARDIOVERSION N/A 02/28/2021   Procedure: TRANSESOPHAGEAL ECHOCARDIOGRAM (TEE);  Surgeon: Freada Bergeron, MD;  Location: [facility] ENDOSCOPY;  Service: Cardiovascular;  Laterality: N/A;   TEE WITHOUT CARDIOVERSION N/A 05/28/2021   Procedure: TRANSESOPHAGEAL ECHOCARDIOGRAM (  TEE);  Surgeon: Werner Lean, MD;  Location: [facility] ENDOSCOPY;  Service: Cardiovascular;  Laterality: N/A;   THORACIC DISCECTOMY N/A 07/10/2020   Procedure: Spinal cord stimulator via  - Thoracic Eight-Thoracic Nine, Thoracic Nine-Thoracic Ten Laminectomy;  Surgeon: Eustace Moore, MD;  Location: [place] [facility];  Service: Neurosurgery;  Laterality: N/A;  3C   TOE AMPUTATION Right  2010   second toe   TONSILLECTOMY      Social History:  reports that he has been smoking cigars. He has never used smokeless tobacco. He reports current alcohol use. He reports that he does not use drugs.   Allergies  Allergen Reactions   Simvastatin Other (See Comments)    Leg pains    Atorvastatin Other (See Comments)    Muscle aches   Benazepril Hcl Other (See Comments)    Pt does not know reaction    Losartan Other (See Comments)    Pt does not have reaction    Pravastatin Other (See Comments)    Leg pains    Rosuvastatin Other (See Comments)    Leg pains    Spironolactone Other (See Comments)    Pt does not reaction     Family History  Problem Relation Age of Onset   Stroke Mother    Hypertension Other        family history      Prior to Admission medications   Medication Sig Start Date End Date Taking? Authorizing Provider  amoxicillin (AMOXIL) 500 MG capsule Take 2,000 mg by mouth See admin instructions. Take 1 hour for dental procedures Patient not taking: Reported on 10/06/2021 04/02/21   [provider]  celecoxib (CELEBREX) 200 MG capsule Take 200 mg by mouth daily. 03/15/20   [provider]  ciclopirox (LOPROX) 0.77 % cream Apply 1 application topically daily. 04/28/21   [provider]  citalopram (CELEXA) 20 MG tablet Take 20 mg by mouth daily.    [provider]  clopidogrel (PLAVIX) 75 MG tablet Take 1 tablet (75 mg total) by mouth daily. 04/08/21   Rondel Jumbo, PA-C  doxycycline (VIBRA-TABS) 100 MG tablet Take 1 tablet (100 mg total) by mouth 2 (two) times daily. Patient not taking: Reported on 05/21/2021 04/11/21   Rosiland Oz, MD  ezetimibe (ZETIA) 10 MG tablet Take 1 tablet (10 mg total) by mouth every evening. 02/26/20   Angiulli, Lavon Paganini, PA-C  fluticasone (FLONASE) 50 MCG/ACT nasal spray Place 1 spray into both nostrils daily as needed for allergies.     [provider]  furosemide (LASIX) 20 MG  tablet Take 1 tablet (20 mg total) by mouth every other day. Patient taking differently: Take 20 mg by mouth every other day. Per pt call in PCP increased furosemide to 40 mg PO BID QOD. 04/18/21   Chandrasekhar, Mahesh A, MD  gabapentin (NEURONTIN) 400 MG capsule Take 400 mg by mouth at bedtime. Patient not taking: Reported on 10/06/2021    [provider]  gabapentin (NEURONTIN) 600 MG tablet Take 1 tablet (600 mg total) by mouth 3 (three) times daily. Patient taking differently: Take 600 mg by mouth 2 (two) times daily. Throughout the day, NOT at bedtime(Currently taken ONE tab at HS) 02/26/20   Angiulli, Lavon Paganini, PA-C  HYDROcodone-acetaminophen (NORCO) 7.5-325 MG tablet Take 1 tablet by mouth every 8 (eight) hours as needed for moderate pain. 02/26/20   Angiulli, Lavon Paganini, PA-C  Magnesium 250 MG TABS Take 250 mg by mouth daily in the afternoon.  [provider]  metoprolol succinate (TOPROL-XL) 25 MG 24 hr tablet Take 12.5 mg by mouth daily.    [provider]  MULTIPLE VITAMIN PO Take 1 tablet by mouth daily.     [provider]  omeprazole (PRILOSEC) 20 MG capsule Take 20 mg by mouth daily.    [provider]  pravastatin (PRAVACHOL) 20 MG tablet Take 1 tablet (20 mg total) by mouth daily at 6 PM. 02/26/20   Angiulli, Lavon Paganini, PA-C  saccharomyces boulardii (FLORASTOR) 250 MG capsule Take 250 mg by mouth daily in the afternoon. 05/06/18   [provider]  senna-docusate (SENOKOT-S) 8.6-50 MG tablet Take 1 tablet by mouth 2 (two) times daily. 02/26/20   Angiulli, Lavon Paganini, PA-C  tamsulosin (FLOMAX) 0.4 MG CAPS capsule Take 1 capsule (0.4 mg total) by mouth daily. 02/26/20   Angiulli, Lavon Paganini, PA-C  traZODone (DESYREL) 50 MG tablet Take 25 mg by mouth at bedtime as needed for sleep. 03/07/21   [provider]  triamcinolone (KENALOG) 0.1 % Apply 1 application topically daily as needed (irritation).    [provider]     Physical Exam: BP (!) 149/103 (BP Location: Right Arm)   Pulse (!) 57   Temp 98.1 F (36.7 C) (Oral)   Resp 20   Ht '5\' 11"'$  (1.803 m)   Wt 81.6 kg   SpO2 97%   BMI 25.09 kg/m   General: 88 y.o. year-old male well developed well nourished in no acute distress.  Alert and oriented x3. Cardiovascular: Regular rate and rhythm with no rubs or gallops.  No thyromegaly or JVD noted.  No lower extremity edema. 2/4 pulses in all 4 extremities. Respiratory: Mild diffuse rales bilaterally.  Poor inspiratory effort. Abdomen: Soft nontender nondistended with normal bowel sounds x4 quadrants. Muskuloskeletal: No cyanosis, clubbing or edema noted bilaterally Neuro: CN II-XII intact, strength, sensation, reflexes Skin: No ulcerative lesions noted or rashes Psychiatry: Judgement and insight appear normal. Mood is appropriate for condition and setting          Labs on Admission:  Basic Metabolic Panel: Recent Labs  Lab 10/17/21 1336  NA 137  K 4.5  CL 105  CO2 22  GLUCOSE 104*  BUN 26*  CREATININE 1.25*  CALCIUM 8.9   Liver Function Tests: Recent Labs  Lab 10/17/21 1333  AST 33  ALT 28  ALKPHOS 56  BILITOT 1.0  PROT 6.8  ALBUMIN 4.5   No results for input(s): "LIPASE", "AMYLASE" in the last 168 hours. No results for input(s): "AMMONIA" in the last 168 hours. CBC: Recent Labs  Lab 10/17/21 1336  WBC 7.6  HGB 14.5  HCT 43.7  MCV 95.6  PLT 231   Cardiac Enzymes: No results for input(s): "CKTOTAL", "CKMB", "CKMBINDEX", "TROPONINI" in the last 168 hours.  BNP (last 3 results) Recent Labs    02/24/21 1850 10/17/21 1333  BNP 630.4* 1,366.3*    ProBNP (last 3 results) No results for input(s): "PROBNP" in the last 8760 hours.  CBG: No results for input(s): "GLUCAP" in the last 168 hours.  Radiological Exams on Admission: DG Chest Portable 1 View  Result Date: 10/17/2021 CLINICAL DATA:  Shortness of breath EXAM: PORTABLE CHEST 1 VIEW COMPARISON:  Chest x-ray  dated July 21st 2023 FINDINGS: Unchanged cardiac and mediastinal contours. Spinal stimulator device lead overlying the thoracic spine. Mild bilateral heterogeneous opacities, similar to prior exam. Ill-defined right hemidiaphragm which is likely due to atelectasis. No large pleural effusion or pneumothorax.  IMPRESSION: 1. Mild bilateral heterogeneous opacities, similar to prior exam and likely due to pulmonary edema. 2. Right basilar atelectasis. Electronically Signed   By: Yetta Glassman M.D.   On: 10/17/2021 14:30    EKG: I independently viewed the EKG done and my findings are as followed: Sinus rhythm rate of 84.  With frequent PVCs.  QTc 498.  Assessment/Plan Present on Admission: **None**  Principal Problem:   CHF (congestive heart failure) (HCC)  Acute on chronic combined diastolic and systolic CHF Last TEE done on 05/28/2021 revealed LVEF 30% with grade 2 diastolic dysfunction.  TEE was done due to acute endocarditis about 6 months ago. Presents with dyspnea with minimal exertion, elevated BNP greater than 1300, pulmonary edema seen on chest x-ray, personally reviewed. Continue IV diuresing, IV Lasix 40 mg daily. Replace electrolytes as indicated Monitor strict I's and O's and daily weight Cardiology consulted  Elevated troponin, suspect demand ischemia in the setting of acute hypoxia Denies chest pain High-sensitivity troponin peaked at 22 and down-trended Deferred further management to cardiology Closely monitor on telemetry.  Acute hypoxic respiratory failure secondary to pulmonary edema, likely cardiogenic Not on oxygen supplementation at baseline Currently requiring 2 L to maintain O2 saturation greater than 92% Wean off oxygen supplementation as tolerated. Incentive spirometry Mobilize as tolerated.  History of endocarditis 6 months ago Frequent PVCs Appears the patient was on p.o. amoxicillin and doxycycline, no longer taking. Last TEE was on 05/28/21. Defer repeat 2D  echo and further management to cardiology.  Coronary artery disease Resume home Plavix, Zetia and pravastatin. Denies any chest pain. Closely monitor on telemetry.  History of CVA/TIA Resume home Plavix, Zetia and Pravastatin  CKD 3A Appears to be at his baseline creatinine and GFR. Avoid nephrotoxic agents and hypotension. Monitor urine output with strict I's and O's.  Hyperlipidemia Resume home Zetia and pravastatin.  BPH Resume home tamsulosin. Monitor urine output Bladder scan if evidence of acute urinary retention.  Situational insomnia Melatonin as needed  Prolonged Qtc Avoid QTc prolonging agents Optimize magnesium and potassium levels Repeat twelve-lead EKG in the morning  GERD Resume home PPI.  Essential hypertension Intermittent bradycardia Bradycardic at this time with heart rate fluctuating between 44 and 84 Management per cardiology Continue to closely monitor vital signs and on telemetry.  Polyneuropathy Resume home regimen  Chronic anxiety/depression Resume home regimen     DVT prophylaxis: Subcu Lovenox daily  Code Status: Full code  Family Communication: None at bedside  Disposition Plan: Admitted to telemetry medical unit as observation status by Dr. Roosevelt Locks, [facility], hospitalist service.  Consults called: Cardiology.  Admission status: Observation status, however the patient might require more than 2 midnights for further evaluation and treatment of present condition.  Will be reassessed in the morning.   Status is: Observation    Kayleen Memos MD [facility] Pager [phone]  If 7PM-7AM, please contact night-coverage [email or web address] Password [facility]  10/17/2021, 7:41 PM

## 2021-10-17 NOTE — Consult Note (Addendum)
Cardiology Consultation:   Patient ID: Ruben Barrera MRN: [id number]; DOB: 02-25-1933  Admit date: 10/17/2021 Date of Consult: 10/17/2021  PCP:  Kathalene Frames, MD   [facility] Providers Cardiologist:  Werner Lean, MD        Patient Profile:   Ruben Barrera is a 88 y.o. male with a hx of hypertension, hyperlipidemia, nonobstructive coronary disease, heart failure with ejection fraction, multiple revisions arctic stenosis, frequent PVCs and history of NSVT and SVT, lumbar stenosis status post spinal stimulator bilateral total knee replacement, history of right-sided CVA without residual deficits who is being seen 10/17/2021 for the evaluation of decompensated heart failure at the request of hospitalist team.  History of Present Illness:   Ruben Barrera is a very active active gentleman who looks younger than stated age.  Patient reports worsening over the last several weeks particularly over the last several days.  Patient reports PND, orthopnea and bendopnea.  Initially patient presented to the hospital patient was found to be in decompensated heart failure with tachypnea, hypoxia evidence of pulmonary chest x-ray and elevated troponin.     Past Medical History:  Diagnosis Date   Arthritis    Coronary artery disease    Depression    Dysrhythmia    "extra heart beat"   GERD (gastroesophageal reflux disease)    History of hiatal hernia    Hypercholesteremia    Hypertension    Lumbar spinal stenosis    RLS (restless legs syndrome)    takes ativan as needed   Stroke ([place]) 01/2020   minor - no deficits    Past Surgical History:  Procedure Laterality Date   BACK SURGERY     COLONOSCOPY     EYE SURGERY Bilateral    cataracts removed    LUMBAR LAMINECTOMY/DECOMPRESSION MICRODISCECTOMY N/A 08/28/2015   Procedure: Lumbar Four-Five decompressive lumbar laminectomy;  Surgeon: Jovita Gamma, MD;  Location: [place] [facility];  Service: Neurosurgery;  Laterality: N/A;    REPLACEMENT TOTAL KNEE BILATERAL Bilateral 11/05/2003   RIGHT/LEFT HEART CATH AND CORONARY ANGIOGRAPHY N/A 03/28/2020   Procedure: RIGHT/LEFT HEART CATH AND CORONARY ANGIOGRAPHY;  Surgeon: Burnell Blanks, MD;  Location: [place] CV LAB;  Service: Cardiovascular;  Laterality: N/A;   TEE WITHOUT CARDIOVERSION N/A 02/28/2021   Procedure: TRANSESOPHAGEAL ECHOCARDIOGRAM (TEE);  Surgeon: Freada Bergeron, MD;  Location: [facility] ENDOSCOPY;  Service: Cardiovascular;  Laterality: N/A;   TEE WITHOUT CARDIOVERSION N/A 05/28/2021   Procedure: TRANSESOPHAGEAL ECHOCARDIOGRAM (TEE);  Surgeon: Werner Lean, MD;  Location: [facility] ENDOSCOPY;  Service: Cardiovascular;  Laterality: N/A;   THORACIC DISCECTOMY N/A 07/10/2020   Procedure: Spinal cord stimulator via  - Thoracic Eight-Thoracic Nine, Thoracic Nine-Thoracic Ten Laminectomy;  Surgeon: Eustace Moore, MD;  Location: [place];  Service: Neurosurgery;  Laterality: N/A;  3C   TOE AMPUTATION Right 2010   second toe   TONSILLECTOMY       Home Medications:  Prior to Admission medications   Medication Sig Start Date End Date Taking? Authorizing Provider  celecoxib (CELEBREX) 200 MG capsule Take 200 mg by mouth daily. 03/15/20  Yes [provider]  citalopram (CELEXA) 20 MG tablet Take 20 mg by mouth daily.   Yes [provider]  clopidogrel (PLAVIX) 75 MG tablet Take 1 tablet (75 mg total) by mouth daily. 04/08/21  Yes Rondel Jumbo, PA-C  ezetimibe (ZETIA) 10 MG tablet Take 1 tablet (10 mg total) by mouth every evening. 02/26/20  Yes Angiulli, Lavon Paganini, PA-C  fluticasone (FLONASE) 50 MCG/ACT nasal spray Place 1 spray into both nostrils daily as needed for allergies.    Yes [provider]  furosemide (LASIX) 20 MG tablet Take 1 tablet (20 mg total) by mouth every other day. 04/18/21  Yes Chandrasekhar, Mahesh A, MD  gabapentin (NEURONTIN) 800 MG tablet Take 800 mg by mouth daily as needed (For pain).   Yes [provider]  HYDROcodone-acetaminophen (NORCO) 7.5-325 MG tablet Take 1 tablet by mouth every 8 (eight) hours as needed for moderate pain. 02/26/20  Yes Angiulli, Lavon Paganini, PA-C  LORazepam (ATIVAN) 1 MG tablet Take 1 mg by mouth at bedtime as needed for sleep. 10/03/21  Yes [provider]  Magnesium 250 MG TABS Take 250 mg by mouth daily in the afternoon.   Yes [provider]  metoprolol succinate (TOPROL-XL) 25 MG 24 hr tablet Take 12.5 mg by mouth daily.   Yes [provider]  MULTIPLE VITAMIN PO Take 1 tablet by mouth daily.    Yes [provider]  omeprazole (PRILOSEC) 20 MG capsule Take 20 mg by mouth daily.   Yes [provider]  senna-docusate (SENOKOT-S) 8.6-50 MG tablet Take 1 tablet by mouth 2 (two) times daily. 02/26/20  Yes Angiulli, Lavon Paganini, PA-C  tamsulosin (FLOMAX) 0.4 MG CAPS capsule Take 1 capsule (0.4 mg total) by mouth daily. 02/26/20  Yes Angiulli, Lavon Paganini, PA-C  traZODone (DESYREL) 50 MG tablet Take 25 mg by mouth at bedtime as needed for sleep. 03/07/21  Yes [provider]  triamcinolone (KENALOG) 0.1 % Apply 1 application topically daily as needed (irritation).   Yes [provider]  amoxicillin (AMOXIL) 500 MG capsule Take 2,000 mg by mouth See admin instructions. Take 1 hour for dental procedures Patient not taking: Reported on 10/06/2021 04/02/21   [provider]  doxycycline (VIBRA-TABS) 100 MG tablet Take 1 tablet (100 mg total) by mouth 2 (two) times daily. Patient not taking: Reported on 05/21/2021 04/11/21   Rosiland Oz, MD  gabapentin (NEURONTIN) 600 MG tablet Take 1 tablet (600 mg total) by mouth 3 (three) times daily. Patient not taking: Reported on 10/17/2021 02/26/20   Angiulli, Lavon Paganini, PA-C  pravastatin (PRAVACHOL) 20 MG tablet Take 1 tablet (20 mg total) by mouth daily at 6 PM. Patient not taking: Reported on 10/17/2021 02/26/20   Angiulli, Lavon Paganini, PA-C  saccharomyces boulardii  (FLORASTOR) 250 MG capsule Take 250 mg by mouth daily in the afternoon. Patient not taking: Reported on 10/17/2021 05/06/18   [provider]    Inpatient Medications: Scheduled Meds:  citalopram  20 mg Oral Daily   clopidogrel  75 mg Oral Daily   enoxaparin (LOVENOX) injection  40 mg Subcutaneous Q24H   ezetimibe  10 mg Oral QPM   furosemide  40 mg Intravenous Daily   gabapentin  300 mg Oral TID   Magnesium  250 mg Oral Q1500   Multiple Vitamin   Oral Daily   pantoprazole  40 mg Oral Daily   [START ON 10/18/2021] pravastatin  20 mg Oral q1800   tamsulosin  0.4 mg Oral Daily   Continuous Infusions:  PRN Meds: acetaminophen, fluticasone, melatonin, polyethylene glycol, prochlorperazine  Allergies:    Allergies  Allergen Reactions   Simvastatin Other (See Comments)    Leg pains    Atorvastatin Other (See Comments)    Muscle aches   Benazepril Hcl Other (See Comments)    Pt does not know reaction    Losartan  Other (See Comments)    Pt does not have reaction    Pravastatin Other (See Comments)    Leg pains    Rosuvastatin Other (See Comments)    Leg pains    Spironolactone Other (See Comments)    Pt does not reaction     Social History:   Social History   Socioeconomic History   Marital status: Married    Spouse name: Not on file   Number of children: Not on file   Years of education: Not on file   Highest education level: Not on file  Occupational History   Not on file  Tobacco Use   Smoking status: Some Days    Types: Cigars   Smokeless tobacco: Never   Tobacco comments:    quit 1972- currently 1 cigar per week  Vaping Use   Vaping Use: Never used  Substance and Sexual Activity   Alcohol use: Yes    Comment: very occasionally beer/wine or scotch   Drug use: No    Comment: 1 cigar per week   Sexual activity: Not Currently  Other Topics Concern   Not on file  Social History Narrative   Right handed   Lives with wife one story home   Social  Determinants of Health   Financial Resource Strain: Not on file  Food Insecurity: Not on file  Transportation Needs: Not on file  Physical Activity: Not on file  Stress: Not on file  Social Connections: Not on file  Intimate Partner Violence: Not on file    Family History:    Otherwise negative Family History  Problem Relation Age of Onset   Stroke Mother    Hypertension Other        family history     ROS:  Please see the history of present illness.  Negative than stated above All other ROS reviewed and negative.     Physical Exam/Data:   Vitals:   10/17/21 1700 10/17/21 1747 10/17/21 1747 10/17/21 2004  BP: (!) 149/103  (!) 149/103 (!) 160/97  Pulse: 93  (!) 57 (!) 45  Resp: (!) '21  20 15  '$ Temp:  98.1 F (36.7 C)  97.7 F (36.5 C)  TempSrc:  Oral  Oral  SpO2: (!) 88%  97% 97%  Weight:      Height:       No intake or output data in the 24 hours ending 10/17/21 2050    10/17/2021    1:31 PM 10/06/2021    9:05 AM 05/28/2021    8:00 AM  Last 3 Weights  Weight (lbs) 179 lb 14.3 oz 180 lb 177 lb  Weight (kg) 81.6 kg 81.647 kg 80.287 kg     Body mass index is 25.09 kg/m.  General:  Well nourished, well developed, in no acute distress, laying in bed with his head elevated 45 degrees HEENT: normal Neck: noted JVD Vascular: No carotid bruits; Distal pulses 2+ bilaterally Cardiac:  normal S1, S2; RRR;RUSB 3/6, rad to carotids, JVD to the general Lungs: no wheezing, rhonchi, bilateral rales in the lower thirds  abd: soft, nontender, no hepatomegaly  Ext: no edema Musculoskeletal:  No deformities, BUE and BLE strength normal and equal Skin: warm and dry  Neuro:  CNs 2-12 intact, no focal abnormalities noted Psych:  Normal affect   EKG:  The EKG was personally reviewed and demonstrates: Sinus rhythm with PVC bigeminy reported Telemetry:  Telemetry was personally reviewed and demonstrates: Frequent PVCs with overwhelming majority being  monomorphic, bigeminy  pattern,  Relevant CV Studies: Date:11/21/20 Results: 1. Left ventricular ejection fraction, by estimation, is 40 to 45%. The  left ventricle has mildly decreased function. The left ventricle  demonstrates regional wall motion abnormalities (see scoring  diagram/findings for description). The left ventricular   internal cavity size was moderately dilated. Left ventricular diastolic  parameters are consistent with Grade I diastolic dysfunction (impaired  relaxation).   2. Right ventricular systolic function is mildly reduced. The right  ventricular size is mildly enlarged.   3. Left atrial size was severely dilated.   4. The mitral valve is normal in structure. Mild mitral valve  regurgitation. No evidence of mitral stenosis.   5. The aortic valve has an indeterminant number of cusps. There is severe  calcifcation of the aortic valve. There is severe thickening of the aortic  valve. Aortic valve regurgitation is not visualized. Severe aortic valve  stenosis. Aortic valve area, by   VTI measures 0.76 cm. Aortic valve mean gradient measures 23.0 mmHg.  Aortic valve Vmax measures 3.21 m/s. Mean AVG and peak velocity are  consistent with moderate AS but the SVI is low at 30 and the dimensionless  index is low at 0.22. Suspect this  respresents low flow low gradient severe AS in setting of reduced EF.   6. The inferior vena cava is normal in size with greater than 50%  respiratory variability, suggesting right atrial pressure of 3 mmHg.   7. Compared to study of 02/2020, the mean transaortic gradient has  decreased from 25 to 31mHg but the dimensionless index has decreased from  0.28 to 0.22 and SVI has decreased from 40 to 30. LVF appears improved  from prior study.   FINDINGS   Left Ventricle: LVMI 197 g/m2 RWT 0.54. Left ventricular ejection fraction, by estimation, is 30 to 35%.  The left ventricle has moderately decreased function. The left ventricle  demonstrates global  hypokinesis. The left ventricular internal cavity size  was mildly dilated. There is severe concentric left ventricular hypertrophy. Left ventricular diastolic parameters are indeterminate.    NM Stress Testing : Date: 02/21/2020 Results:  Notes Recorded by KDorothy Spark MD on 05/10/2014 at 2:50 PM Normal stress test   Left/Right Heart Catheterizations: Date: 03/28/2020 Results: Prox Cx lesion is 30% stenosed. Prox LAD to Mid LAD lesion is 60% stenosed. Dist LAD lesion is 60% stenosed.   1. The LAD is a large vessel that courses to the apex. Moderate, heavily calcified proximal to mid stenosis that does is eccentric and does not appear to flow limiting. Focal mid to distal moderate stenosis.  2. Moderate disease in the intermediate branch 3. Mild plaque in the Circumflex 4. Large, dominant RCA with no obstructive disease 5. Low flow/low gradient moderate to severe aortic stenosis (Cath data: Mean gradient 16.347mg, peak to peak gradient 16 mmHg). By echo his dimensionless index is 0.28 and SVI is 40.    Recommendations: He appears to have low flow/low gradient moderate to severe aortic stenosis. He has moderate CAD but in the absence of angina, would not treat the calcific disease in the proximal and mid LAD. He is asymptomatic at this time.   TEE 05/28/2021  Left ventricular ejection fraction, by estimation, is 30%. Left  ventricular ejection fraction by 3D volume is 28 %. The left ventricle has  severely decreased function. The left ventricle demonstrates global  hypokinesis. The left ventricular internal  cavity size was mildly dilated. Left ventricular diastolic function could  not be evaluated.   2. Right ventricular systolic function is normal. The right ventricular  size is normal.   3. Left atrial size was moderately dilated. No left atrial/left atrial  appendage thrombus was detected. The LAA emptying velocity was 47 cm/s.   4. Right atrial size was mildly dilated.   5.  The mitral valve is degenerative. There are two small echodensities of  atrial surface of the mitral valve. Differential in the setting of prior  infective endocarditis includes small healed vegetations. Mild MR on this  study.   6. The aortic valve is tricuspid. Aortic valve regurgitation is not  visualized. Aortic valve area, by VTI measures 1.46 cm. Aortic valve mean  gradient measures 12.0 mmHg. DVI 0.4 at suboptimal angles; likely moderate  aortic stenosis.   7. There is mild (Grade II) plaque involving the descending aorta.  Laboratory Data:  High Sensitivity Troponin:   Recent Labs  Lab 10/17/21 1336 10/17/21 1548  TROPONINIHS 22* 21*     Chemistry Recent Labs  Lab 10/17/21 1336  NA 137  K 4.5  CL 105  CO2 22  GLUCOSE 104*  BUN 26*  CREATININE 1.25*  CALCIUM 8.9  GFRNONAA 56*  ANIONGAP 10    Recent Labs  Lab 10/17/21 1333  PROT 6.8  ALBUMIN 4.5  AST 33  ALT 28  ALKPHOS 56  BILITOT 1.0   Lipids No results for input(s): "CHOL", "TRIG", "HDL", "LABVLDL", "LDLCALC", "CHOLHDL" in the last 168 hours.  Hematology Recent Labs  Lab 10/17/21 1336  WBC 7.6  RBC 4.57  HGB 14.5  HCT 43.7  MCV 95.6  MCH 31.7  MCHC 33.2  RDW 14.0  PLT 231   Thyroid No results for input(s): "TSH", "FREET4" in the last 168 hours.  BNP Recent Labs  Lab 10/17/21 1333  BNP 1,366.3*    DDimer No results for input(s): "DDIMER" in the last 168 hours.   Radiology/Studies:  DG Chest Portable 1 View  Result Date: 10/17/2021 CLINICAL DATA:  Shortness of breath EXAM: PORTABLE CHEST 1 VIEW COMPARISON:  Chest x-ray dated July 21st 2023 FINDINGS: Unchanged cardiac and mediastinal contours. Spinal stimulator device lead overlying the thoracic spine. Mild bilateral heterogeneous opacities, similar to prior exam. Ill-defined right hemidiaphragm which is likely due to atelectasis. No large pleural effusion or pneumothorax. IMPRESSION: 1. Mild bilateral heterogeneous opacities, similar to  prior exam and likely due to pulmonary edema. 2. Right basilar atelectasis. Electronically Signed   By: Yetta Glassman M.D.   On: 10/17/2021 14:30     Assessment and Plan:   Acute decompensated heart failure in the setting of known HFrEF nonischemic cardiomyopathy complicated by pulmonary edema Previously unable to tolerate GDMT because of hypotension and bradycardia Borderline warm and wet NYHA class III stage C Continuous diuresis Lasix 40 mg IV twice daily, goal net -2 to 2.5 L a day. Potassium 4, Mg2 Consider initiating low-dose spironolactone once patient is closer to euvolemia. 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  Patient verbalized understanding particularly to improve his symptoms and prevent frequent hospitalizations  3 LFLG based on TEE- only moderate AS 4. Sp mitral IE- resolved- minor degenerative changes.  Continue current antibiotics prophylaxis prior to invasive procedures. 5.  Nonobstructive coronary disease. Continue with home aspirin and Zetia-pravastatin  Risk Assessment/Risk Scores:        New York Heart Association (NYHA) Functional Class NYHA Class III        For questions or updates, please contact [facility] Please consult [email or web address] for contact info under    Signed, Warren Danes, MD  10/17/2021 8:50 PM

## 2021-10-18 DIAGNOSIS — I350 Nonrheumatic aortic (valve) stenosis: Secondary | ICD-10-CM

## 2021-10-18 DIAGNOSIS — I5043 Acute on chronic combined systolic (congestive) and diastolic (congestive) heart failure: Secondary | ICD-10-CM

## 2021-10-18 DIAGNOSIS — I4729 Other ventricular tachycardia: Secondary | ICD-10-CM | POA: Diagnosis not present

## 2021-10-18 DIAGNOSIS — I2583 Coronary atherosclerosis due to lipid rich plaque: Secondary | ICD-10-CM

## 2021-10-18 DIAGNOSIS — I2510 Atherosclerotic heart disease of native coronary artery without angina pectoris: Secondary | ICD-10-CM | POA: Diagnosis not present

## 2021-10-18 DIAGNOSIS — E7800 Pure hypercholesterolemia, unspecified: Secondary | ICD-10-CM | POA: Diagnosis present

## 2021-10-18 DIAGNOSIS — I639 Cerebral infarction, unspecified: Secondary | ICD-10-CM | POA: Diagnosis present

## 2021-10-18 DIAGNOSIS — N1831 Chronic kidney disease, stage 3a: Secondary | ICD-10-CM | POA: Diagnosis present

## 2021-10-18 DIAGNOSIS — I10 Essential (primary) hypertension: Secondary | ICD-10-CM | POA: Diagnosis present

## 2021-10-18 DIAGNOSIS — I5033 Acute on chronic diastolic (congestive) heart failure: Secondary | ICD-10-CM

## 2021-10-18 LAB — CBC WITH DIFFERENTIAL/PLATELET
Abs Immature Granulocytes: 0.03 10*3/uL (ref 0.00–0.07)
Basophils Absolute: 0.1 10*3/uL (ref 0.0–0.1)
Basophils Relative: 1 %
Eosinophils Absolute: 0.2 10*3/uL (ref 0.0–0.5)
Eosinophils Relative: 2 %
HCT: 46.3 % (ref 39.0–52.0)
Hemoglobin: 16.3 g/dL (ref 13.0–17.0)
Immature Granulocytes: 0 %
Lymphocytes Relative: 20 %
Lymphs Abs: 1.7 10*3/uL (ref 0.7–4.0)
MCH: 33.1 pg (ref 26.0–34.0)
MCHC: 35.2 g/dL (ref 30.0–36.0)
MCV: 94.1 fL (ref 80.0–100.0)
Monocytes Absolute: 1 10*3/uL (ref 0.1–1.0)
Monocytes Relative: 12 %
Neutro Abs: 5.4 10*3/uL (ref 1.7–7.7)
Neutrophils Relative %: 65 %
Platelets: 281 10*3/uL (ref 150–400)
RBC: 4.92 MIL/uL (ref 4.22–5.81)
RDW: 13.6 % (ref 11.5–15.5)
WBC: 8.4 10*3/uL (ref 4.0–10.5)
nRBC: 0 % (ref 0.0–0.2)

## 2021-10-18 LAB — COMPREHENSIVE METABOLIC PANEL
ALT: 36 U/L (ref 0–44)
AST: 37 U/L (ref 15–41)
Albumin: 4.5 g/dL (ref 3.5–5.0)
Alkaline Phosphatase: 62 U/L (ref 38–126)
Anion gap: 11 (ref 5–15)
BUN: 24 mg/dL — ABNORMAL HIGH (ref 8–23)
CO2: 30 mmol/L (ref 22–32)
Calcium: 9.3 mg/dL (ref 8.9–10.3)
Chloride: 101 mmol/L (ref 98–111)
Creatinine, Ser: 1.41 mg/dL — ABNORMAL HIGH (ref 0.61–1.24)
GFR, Estimated: 48 mL/min — ABNORMAL LOW (ref >60)
Glucose, Bld: 126 mg/dL — ABNORMAL HIGH (ref 70–99)
Potassium: 3.8 mmol/L (ref 3.5–5.1)
Sodium: 142 mmol/L (ref 135–145)
Total Bilirubin: 1.1 mg/dL (ref 0.3–1.2)
Total Protein: 7.2 g/dL (ref 6.5–8.1)

## 2021-10-18 LAB — PHOSPHORUS: Phosphorus: 4.3 mg/dL (ref 2.5–4.6)

## 2021-10-18 LAB — MAGNESIUM: Magnesium: 2.2 mg/dL (ref 1.7–2.4)

## 2021-10-18 MED ORDER — FUROSEMIDE 20 MG PO TABS
20.0000 mg | ORAL_TABLET | Freq: Every day | ORAL | Status: DC
  Administered 2021-10-18: 20 mg via ORAL
  Filled 2021-10-18: qty 1

## 2021-10-18 MED ORDER — HYDROCODONE-ACETAMINOPHEN 7.5-325 MG PO TABS
1.0000 | ORAL_TABLET | Freq: Three times a day (TID) | ORAL | Status: DC | PRN
  Administered 2021-10-18: 1 via ORAL
  Filled 2021-10-18: qty 1

## 2021-10-18 MED ORDER — TRAZODONE HCL 50 MG PO TABS
50.0000 mg | ORAL_TABLET | Freq: Once | ORAL | Status: AC
Start: 2021-10-18 — End: 2021-10-18
  Administered 2021-10-18: 50 mg via ORAL
  Filled 2021-10-18: qty 1

## 2021-10-18 MED ORDER — LIVING BETTER WITH HEART FAILURE BOOK: Freq: Once | Status: AC

## 2021-10-18 MED ORDER — FUROSEMIDE 20 MG PO TABS: 20.0000 mg | ORAL_TABLET | Freq: Every day | ORAL | 0 refills | Status: AC

## 2021-10-18 MED ORDER — METOPROLOL SUCCINATE ER 25 MG PO TB24: 12.5000 mg | ORAL_TABLET | Freq: Every day | ORAL | 0 refills | Status: DC

## 2021-10-18 MED ORDER — LORAZEPAM 1 MG PO TABS: 1.0000 mg | ORAL_TABLET | Freq: Every evening | ORAL | Status: DC | PRN

## 2021-10-18 MED ORDER — TRAZODONE HCL 50 MG PO TABS: 25.0000 mg | ORAL_TABLET | Freq: Every evening | ORAL | Status: DC | PRN

## 2021-10-18 MED ORDER — METOPROLOL SUCCINATE ER 25 MG PO TB24
12.5000 mg | ORAL_TABLET | Freq: Every day | ORAL | Status: DC
  Administered 2021-10-18: 12.5 mg via ORAL
  Filled 2021-10-18: qty 1

## 2021-10-18 NOTE — Progress Notes (Signed)
Pt is yelling into hallway c/o BLE pain 10/10. Pt is demanding his home medications restarted. Stated he takes norco, trazodone, and ativan daily. Pt also phoned his wife this am asking her to bring his home meds to him because he needs them and we are not treating his pain and anxiety. Primary RN this am Langley Gauss aware of the above and paged attending MD regarding pt's requests.

## 2021-10-18 NOTE — Assessment & Plan Note (Signed)
No angina, no acute coronary syndrome.  Plan to continue with antiplatelet therapy and statin.

## 2021-10-18 NOTE — Assessment & Plan Note (Signed)
Continue with statin therapy.  ?

## 2021-10-18 NOTE — Assessment & Plan Note (Signed)
Patient has been placed on metoprolol, follow up blood pressure as outpatient.  If blood pressure tolerates consider re attempt RAS inhibition considering reduced LV systolic function.

## 2021-10-18 NOTE — Hospital Course (Signed)
Mr. Neuharth was admitted to the hospital with the working diagnosis of decompensated heart failure.   88 yo male with the past medical history of heart failure, hypertension, coronary artery disease, recent CVA, endocarditis 6 mo ago, who presented with dyspnea. Reported 2 weeks of worsening dyspnea, associated with wheezing. On the day of hospitalization he was seen by his primary care provider and was found hypoxic with 02 saturation 82% on room air and was referred to the ED for further evaluation. On his initial physical examination his blood pressure was 149/103. HR 57, RR 20, temp 98.1 and 02 saturation 97% on 2 L/min per supplemental 02 per [place]. Lungs had diffuse rales with no wheezing, poor inspiratory effort, heart with S1 and S2 present and rhythmic, abdomen not distended and no lower extremity edema.   NA 137, K 4,5 Cl 105 bicarbonate 22 glucose 104 bun 26 cr 1,25  BNP 1,366 High sensitive troponin 22 and 21  Wbc 7,6 hgb 14.7 plt 231  Sars covid 19 negative   Chest radiograph with cardiomegaly and bilateral increased lung markings.   EKG 84 bpm, left axis deviation, normal intervals, sinus rhythm with positive PVC in a bigeminy pattern, no significant ST segment or T wave changes.

## 2021-10-18 NOTE — Assessment & Plan Note (Addendum)
TEE from 05/2021 reduced LV systolic function with EF 30%, global hypokinesis, internal cavity has mild dilatation. RV systolic function preserved. Moderate aortic stenosis.   Patient was placed on IV furosemide, negative fluid balance was achieved. -2.810 with improvement in his symptoms.  Systolic blood pressure 754 to 160 mmHg.   Patient will be discharged home with instruction to continue diuresis at home with daily furosemide Metoprolol has been started due to frequent PVC (high burden). To consider starting SGLT 2 inh as outpatient.  In the past he had hypotension with RAS inhibition   Patient with non ischemic cardiomyopathy with high burden PVC with positive non sustained VT.  Follow up response to B blocker.  Suspected inaccurate bradycardia on outpatient heart monitoring due to PVC.

## 2021-10-18 NOTE — Progress Notes (Signed)
   10/18/21 0539  Charting Type  Focused Reassessment Changes Noted Respiratory  Respiratory  Respiratory (WDL) X  Respiratory Pattern Irregular;Apnea (observed while patient sleeping)  Bilateral Breath Sounds Diminished  R Upper  Breath Sounds Rhonchi  L Upper Breath Sounds Rhonchi  Chest Assessment Chest expansion symmetrical   Pt denies history of sleep apnea. Denies use of CPAP. Pt desat to low 80's while on 2-3LNC during apneic episodes. Returns to >90% shortly after.

## 2021-10-18 NOTE — Progress Notes (Signed)
Progress Note  Patient Name: Ruben Barrera Date of Encounter: 10/18/2021  [facility] Cardiologist: Werner Lean, MD   Subjective   Feels greatly improved.  Does not have orthopnea.  Excellent diuresis overnight. Stable renal parameters.  Gets very anxious in the evening (sounds like he may have some sundowning). Extremely frequent PVCs on monitor. Note that his previous reported bradycardia was based on smart watch pulse reports, not smart watch recorded ECG tracings. On physical exam, his PVCs clearly produce a "pulse deficit". Note that this morning vital sign recorded heart rate is 44, but on telemetry the patient was in ventricular bigeminy with a rate of 88 bpm.  Inpatient Medications    Scheduled Meds:  citalopram  20 mg Oral Daily   clopidogrel  75 mg Oral Daily   enoxaparin (LOVENOX) injection  40 mg Subcutaneous Q24H   ezetimibe  10 mg Oral QPM   furosemide  40 mg Intravenous Daily   gabapentin  800 mg Oral QHS   magnesium oxide  200 mg Oral Daily   multivitamin with minerals  1 tablet Oral Daily   pantoprazole  40 mg Oral Daily   pravastatin  20 mg Oral q1800   tamsulosin  0.4 mg Oral Daily   Continuous Infusions:  PRN Meds: acetaminophen, fluticasone, HYDROcodone-acetaminophen, LORazepam, melatonin, polyethylene glycol, prochlorperazine, traZODone   Vital Signs    Vitals:   10/18/21 0535 10/18/21 0620 10/18/21 0636 10/18/21 0819  BP:   (!) 158/74 (!) 136/57  Pulse:   (!) 44   Resp:   18 16  Temp:   (!) 97.4 F (36.3 C) (!) 97.4 F (36.3 C)  TempSrc:   Oral Oral  SpO2: 92%  97% 93%  Weight:  78.5 kg    Height:        Intake/Output Summary (Last 24 hours) at 10/18/2021 0823 Last data filed at 10/18/2021 0700 Gross per 24 hour  Intake 240 ml  Output 3050 ml  Net -2810 ml      10/18/2021    6:20 AM 10/18/2021    4:15 AM 10/17/2021    1:31 PM  Last 3 Weights  Weight (lbs) 173 lb 1.6 oz 173 lb 11.6 oz 179 lb 14.3 oz  Weight (kg)  78.518 kg 78.8 kg 81.6 kg      Telemetry    Sinus rhythm with exceedingly frequent PVCs, ventricular couplets, runs of nonsustained VT and occasional brief 4-8 beat runs of SVT.  There is no evidence of true bradycardia.- Personally Reviewed  ECG    SR with ventricular bigeminy, LVH/incomplete LBBB (QRS 114 ms), marked lateral ST depression, T wave inversion - Personally Reviewed  Physical Exam   GEN: No acute distress.   Neck: No JVD Cardiac: RRR with extremely frequent ectopy and periods of bigeminy, no diastolic murmurs, rubs, or gallops.  There is a 2/6 mid -peaking aortic ejection murmur heard best on post PVC beats. Respiratory: Clear to auscultation bilaterally. GI: Soft, nontender, non-distended  MS: No edema; No deformity. Neuro:  Nonfocal  Psych: Normal affect   Labs    High Sensitivity Troponin:   Recent Labs  Lab 10/17/21 1336 10/17/21 1548  TROPONINIHS 22* 21*     Chemistry Recent Labs  Lab 10/17/21 1333 10/17/21 1336 10/18/21 0042  NA  --  137 142  K  --  4.5 3.8  CL  --  105 101  CO2  --  22 30  GLUCOSE  --  104* 126*  BUN  --  26* 24*  CREATININE  --  1.25* 1.41*  CALCIUM  --  8.9 9.3  MG  --   --  2.2  PROT 6.8  --  7.2  ALBUMIN 4.5  --  4.5  AST 33  --  37  ALT 28  --  36  ALKPHOS 56  --  62  BILITOT 1.0  --  1.1  GFRNONAA  --  56* 48*  ANIONGAP  --  10 11    Lipids No results for input(s): "CHOL", "TRIG", "HDL", "LABVLDL", "LDLCALC", "CHOLHDL" in the last 168 hours.  Hematology Recent Labs  Lab 10/17/21 1336 10/18/21 0042  WBC 7.6 8.4  RBC 4.57 4.92  HGB 14.5 16.3  HCT 43.7 46.3  MCV 95.6 94.1  MCH 31.7 33.1  MCHC 33.2 35.2  RDW 14.0 13.6  PLT 231 281   Thyroid No results for input(s): "TSH", "FREET4" in the last 168 hours.  BNP Recent Labs  Lab 10/17/21 1333  BNP 1,366.3*    DDimer No results for input(s): "DDIMER" in the last 168 hours.   Radiology    DG Chest Portable 1 View  Result Date: 10/17/2021 CLINICAL  DATA:  Shortness of breath EXAM: PORTABLE CHEST 1 VIEW COMPARISON:  Chest x-ray dated July 21st 2023 FINDINGS: Unchanged cardiac and mediastinal contours. Spinal stimulator device lead overlying the thoracic spine. Mild bilateral heterogeneous opacities, similar to prior exam. Ill-defined right hemidiaphragm which is likely due to atelectasis. No large pleural effusion or pneumothorax. IMPRESSION: 1. Mild bilateral heterogeneous opacities, similar to prior exam and likely due to pulmonary edema. 2. Right basilar atelectasis. Electronically Signed   By: Yetta Glassman M.D.   On: 10/17/2021 14:30    Cardiac Studies   ECHO 11/21/20  1. Left ventricular ejection fraction, by estimation, is 40 to 45%. The  left ventricle has mildly decreased function. The left ventricle  demonstrates regional wall motion abnormalities (see scoring  diagram/findings for description). The left ventricular   internal cavity size was moderately dilated. Left ventricular diastolic  parameters are consistent with Grade I diastolic dysfunction (impaired  relaxation).   2. Right ventricular systolic function is mildly reduced. The right  ventricular size is mildly enlarged.   3. Left atrial size was severely dilated.   4. The mitral valve is normal in structure. Mild mitral valve  regurgitation. No evidence of mitral stenosis.   5. The aortic valve has an indeterminant number of cusps. There is severe  calcifcation of the aortic valve. There is severe thickening of the aortic  valve. Aortic valve regurgitation is not visualized. Severe aortic valve  stenosis. Aortic valve area, by   VTI measures 0.76 cm. Aortic valve mean gradient measures 23.0 mmHg.  Aortic valve Vmax measures 3.21 m/s. Mean AVG and peak velocity are  consistent with moderate AS but the SVI is low at 30 and the dimensionless  index is low at 0.22. Suspect this  respresents low flow low gradient severe AS in setting of reduced EF.   6. The inferior  vena cava is normal in size with greater than 50%  respiratory variability, suggesting right atrial pressure of 3 mmHg.   7. Compared to study of 02/2020, the mean transaortic gradient has  decreased from 25 to 55mHg but the dimensionless index has decreased from  0.28 to 0.22 and SVI has decreased from 40 to 30. LVF appears improved  from prior study.    NM Stress Testing : Date: 02/21/2020 Results:  Notes Recorded by  Dorothy Spark, MD on 05/10/2014 at 2:50 PM Normal stress test   Left/Right Heart Catheterizations: Date: 03/28/2020 Results: Prox Cx lesion is 30% stenosed. Prox LAD to Mid LAD lesion is 60% stenosed. Dist LAD lesion is 60% stenosed.   1. The LAD is a large vessel that courses to the apex. Moderate, heavily calcified proximal to mid stenosis that does is eccentric and does not appear to flow limiting. Focal mid to distal moderate stenosis.  2. Moderate disease in the intermediate branch 3. Mild plaque in the Circumflex 4. Large, dominant RCA with no obstructive disease 5. Low flow/low gradient moderate to severe aortic stenosis (Cath data: Mean gradient 16.58mHg, peak to peak gradient 16 mmHg). By echo his dimensionless index is 0.28 and SVI is 40.    Recommendations: He appears to have low flow/low gradient moderate to severe aortic stenosis. He has moderate CAD but in the absence of angina, would not treat the calcific disease in the proximal and mid LAD. He is asymptomatic at this time.  TEE 05/28/2021  Left ventricular ejection fraction, by estimation, is 30%. Left  ventricular ejection fraction by 3D volume is 28 %. The left ventricle has  severely decreased function. The left ventricle demonstrates global  hypokinesis. The left ventricular internal  cavity size was mildly dilated. Left ventricular diastolic function could  not be evaluated.   2. Right ventricular systolic function is normal. The right ventricular  size is normal.   3. Left atrial size was  moderately dilated. No left atrial/left atrial  appendage thrombus was detected. The LAA emptying velocity was 47 cm/s.   4. Right atrial size was mildly dilated.   5. The mitral valve is degenerative. There are two small echodensities of  atrial surface of the mitral valve. Differential in the setting of prior  infective endocarditis includes small healed vegetations. Mild MR on this  study.   6. The aortic valve is tricuspid. Aortic valve regurgitation is not  visualized. Aortic valve area, by VTI measures 1.46 cm. Aortic valve mean  gradient measures 12.0 mmHg. DVI 0.4 at suboptimal angles; likely moderate  aortic stenosis.   7. There is mild (Grade II) plaque involving the descending aorta.   Cardiac MRI 04/30/2020  1. Moderate left ventricular dilation with moderately decreased function, LVEF approximated to 30%. No late gadolinium enhancement appreciated.   2.  Grossly normal RV function.   3.  Severe left atrial dilation.   4.  Velocity encoding artifact consistent with aortic stenosis   5.  Significant cardiac motion artifact.   Patient Profile     88y.o. 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  No longer has orthopnea.  His weight is already less than his previously estimated "dry weight" (per patient 178 pounds).  I think he can switch back to p.o. diuretics and may be ready for discharge later today.  Was not taking diuretics on a daily  basis prior to this admission, but rather taking them every other day.  He has not really been paying attention to  sodium restriction in his diet.  He loves to eat deli meats.  Had a lengthy discussion regarding salt rich foods and how to avoid them.  Also reinforced the importance of daily weight monitoring.  It is my impression that his "dry weight" should be reestimated to be 170-173 pounds.  Note that ARB was discontinued due to symptomatic hypotension.  Aldosterone antagonist has also been stopped in the past.  Consider starting SGLT2 inhibitor. Nonischemic cardiomyopathy: He has exceedingly frequent PVCs.  On my estimation review of his telemetry suggests 20-30% of beats are ventricular ectopy.  This is enough to cause PVC cardiomyopathy or at least to worsen pre-existing heart failure.  Note absence of myocardial scarring on gadolinium enhanced cardiac MRI 2022.   PVCs/NSVT:  I do not see any evidence of true bradycardia.  Clearly both his smart watch optical sensor and the automatic blood pressure cuff underestimate his true heart rate during periods of bigeminy.  I looked at his event monitor from last year and there was no evidence of meaningful bradycardia even though he was taking a low-dose of beta-blocker.  I think that his bradycardia is artifactual due to under-counting of premature ventricular beats.  Restart metoprolol succinate 12.5 mg daily and increase this gradually for arrhythmia suppression.  I instructed the patient to use the ECG feature of his smart watch to record his heart rate, rather than the automatically reported heart rates based on the optical sensor on the back of the watch. AS: Probably no more than moderate based on evaluation with TEE recently. CAD: Moderate/nonobstructive by cardiac catheterization in 2022.  He does not have angina pectoris.  His coronary disease cannot explain the cardiomyopathy.  On antiplatelet agent and on statin.  I am not sure whether he has some  cognitive defects that become apparent in the evening, but he gets very agitated at night.  He takes both lorazepam for anxiety and opiates for back pain.  These were withheld last night and caused a lot of distress.  He may do better in an outpatient setting and I would encourage early discharge.  [facility] will sign off.   Medication Recommendations:  Start metoprolol succinate 12.5 mg daily Change furosemide to 20 mg daily Continue other medications unchanged.  Consider initiation of SGLT2 inhibitor as an outpatient. Other recommendations (labs, testing, etc):  Sodium restricted diet.  Avoid any ingredients that have more than 10% daily recommended sodium per serving.   Daily weights.  Call office if gains more than 3 pounds in 24 hours or more than 5 pounds in a week.  Also call office if weight exceeds 178 pounds at any point. Please monitor heart rate with smart watch.  If the smart watch reports bradycardia (heart rate less than 50 bpm) please confirm this with an ECG recording and send that recording to cardiology office via MyChart Follow up as an outpatient: Already has appointment with Dr. Glenford Bayley on 12/08/2021, we will try to get an earlier transition of care heart failure clinic appointment as well.    For questions or updates, please contact [facility] Please consult [email or web address] for contact info under        Signed, Sanda Klein, MD  10/18/2021, 8:23 AM

## 2021-10-18 NOTE — Discharge Summary (Addendum)
Physician Discharge Summary   Patient: Ruben Barrera MRN: [id number] DOB: [birth year]  Admit date:     10/17/2021  Discharge date: 10/18/21  Discharge Physician: Ruben Barrera Ruben Barrera   PCP: Ruben Frames, MD   Recommendations at discharge:    Patient has been placed on metoprolol for high burden PVC. Instructed to take furosemide 20 mg daily and increase to 40 mg daily in case of volume overload, 2 to 3 lbs weight gain in 24 hrs or 5 lbs in 7 days.  To consider starting SGLT 2 inh as outpatient and if blood pressure tolerated RAS inhibition.  Follow up with Ruben Barrera in 7 to 10 days Follow up with Cardiology as scheduled   Discharge Diagnoses: Principal Problem:   Acute on chronic diastolic CHF (congestive heart failure) ([place]) Active Problems:   Coronary artery disease   Chronic kidney disease, stage 3a ([place])   CVA (cerebral vascular accident) ([place])   Hypercholesteremia   Hypertension  Resolved Problems:   * No resolved hospital problems. [facility] Course: Ruben Barrera was admitted to the hospital with the working diagnosis of decompensated heart failure.   88 yo male with the past medical history of heart failure, hypertension, coronary artery disease, recent CVA, endocarditis 6 mo ago, who presented with dyspnea. Reported 2 weeks of worsening dyspnea, associated with wheezing. On the day of hospitalization he was seen by his primary care provider and was found hypoxic with 02 saturation 82% on room air and was referred to the ED for further evaluation. On his initial physical examination his blood pressure was 149/103. HR 57, RR 20, temp 98.1 and 02 saturation 97% on 2 L/min per supplemental 02 per [place]. Lungs had diffuse rales with no wheezing, poor inspiratory effort, heart with S1 and S2 present and rhythmic, abdomen not distended and no lower extremity edema.   NA 137, K 4,5 Cl 105 bicarbonate 22 glucose 104 bun 26 cr 1,25  BNP 1,366 High sensitive troponin 22  and 21  Wbc 7,6 hgb 14.7 plt 231  Sars covid 19 negative   Chest radiograph with cardiomegaly and bilateral increased lung markings.   EKG 84 bpm, left axis deviation, normal intervals, sinus rhythm with positive PVC in a bigeminy pattern, no significant ST segment or T wave changes.     Assessment and Plan: * Acute on chronic diastolic CHF (congestive heart failure) (HCC) TEE from 05/2021 reduced LV systolic function with EF 30%, global hypokinesis, internal cavity has mild dilatation. RV systolic function preserved. Moderate aortic stenosis.   Patient was placed on IV furosemide, negative fluid balance was achieved. -2.810 with improvement in his symptoms.  Systolic blood pressure 834 to 160 mmHg.   Patient will be discharged home with instruction to continue diuresis at home with daily furosemide Metoprolol has been started due to frequent PVC (high burden). To consider starting SGLT 2 inh as outpatient.  In the past he had hypotension with RAS inhibition   Patient with non ischemic cardiomyopathy with high burden PVC with positive non sustained VT.  Follow up response to B blocker.  Suspected inaccurate bradycardia on outpatient heart monitoring due to PVC.    Coronary artery disease No angina, no acute coronary syndrome.  Plan to continue with antiplatelet therapy and statin.   Chronic kidney disease, stage 3a ([place]) Renal function with serum cr at 1,41 with K at 3,8 and serum bicarbonate at 30 Plan to continue diuresis with furosemide, follow up renal function as outpatient.  Base serum cr is 1.3 to 1,4.   CVA (cerebral vascular accident) ([place]) Stable, follow up as outpatient. Continue with clopidogrel and statin.   Hypercholesteremia Continue with statin therapy.   Hypertension Patient has been placed on metoprolol, follow up blood pressure as outpatient.  If blood pressure tolerates consider re attempt RAS inhibition considering reduced LV systolic function.           Consultants: cardiology  Procedures performed: none   Disposition: Home Diet recommendation:  Cardiac diet DISCHARGE MEDICATION: Allergies as of 10/18/2021       Reactions   Simvastatin Other (See Comments)   Leg pains   Atorvastatin Other (See Comments)   Muscle aches   Benazepril Hcl Other (See Comments)   Pt does not know reaction    Losartan Other (See Comments)   Pt does not have reaction    Pravastatin Other (See Comments)   Leg pains    Rosuvastatin Other (See Comments)   Leg pains    Spironolactone Other (See Comments)   Pt does not reaction         Medication List     STOP taking these medications    amoxicillin 500 MG capsule Commonly known as: AMOXIL   celecoxib 200 MG capsule Commonly known as: CELEBREX   doxycycline 100 MG tablet Commonly known as: VIBRA-TABS   pravastatin 20 MG tablet Commonly known as: PRAVACHOL   saccharomyces boulardii 250 MG capsule Commonly known as: FLORASTOR       TAKE these medications    citalopram 20 MG tablet Commonly known as: CELEXA Take 20 mg by mouth daily.   clopidogrel 75 MG tablet Commonly known as: PLAVIX Take 1 tablet (75 mg total) by mouth daily.   ezetimibe 10 MG tablet Commonly known as: ZETIA Take 1 tablet (10 mg total) by mouth every evening.   fluticasone 50 MCG/ACT nasal spray Commonly known as: FLONASE Place 1 spray into both nostrils daily as needed for allergies.   furosemide 20 MG tablet Commonly known as: LASIX Take 1 tablet (20 mg total) by mouth daily. Take 2 tablets daily in case of weight gain 2 to 3 lbs in 24 hrs or 5 lbs in 7 days. Start taking on: October 19, 2021 What changed:  when to take this additional instructions   gabapentin 800 MG tablet Commonly known as: NEURONTIN Take 800 mg by mouth daily as needed (For pain).   HYDROcodone-acetaminophen 7.5-325 MG tablet Commonly known as: NORCO Take 1 tablet by mouth every 8 (eight) hours as needed for  moderate pain.   LORazepam 1 MG tablet Commonly known as: ATIVAN Take 1 mg by mouth at bedtime as needed for sleep.   Magnesium 250 MG Tabs Take 250 mg by mouth daily in the afternoon.   metoprolol succinate 25 MG 24 hr tablet Commonly known as: TOPROL-XL Take 0.5 tablets (12.5 mg total) by mouth daily. Start taking on: October 19, 2021   MULTIPLE VITAMIN PO Take 1 tablet by mouth daily.   omeprazole 20 MG capsule Commonly known as: PRILOSEC Take 20 mg by mouth daily.   senna-docusate 8.6-50 MG tablet Commonly known as: Senokot-S Take 1 tablet by mouth 2 (two) times daily.   tamsulosin 0.4 MG Caps capsule Commonly known as: FLOMAX Take 1 capsule (0.4 mg total) by mouth daily.   traZODone 50 MG tablet Commonly known as: DESYREL Take 25 mg by mouth at bedtime as needed for sleep.   triamcinolone cream 0.1 % Commonly known as:  KENALOG Apply 1 application topically daily as needed (irritation).        Discharge Exam: Filed Weights   10/17/21 1331 10/18/21 0415 10/18/21 0620  Weight: 81.6 kg 78.8 kg 78.5 kg   BP (!) 149/96 (BP Location: Right Arm)   Pulse (!) 36   Temp (!) 97.5 F (36.4 C) (Oral)   Resp 19   Ht '5\' 11"'$  (1.803 m)   Wt 78.5 kg   SpO2 96%   BMI 24.14 kg/m   Patient is feeling better, back to his baseline  Neurology awake and alert ENT with no pallor Cardiovascular with S1 and S2 present and rhythmic, positive extra beats, with no gallops, or rubs, positive systolic murmur 1-6/1 at the base.  No JVD No lower extremity edema Respiratory with rales at bases but not wheezing Abdomen not distended  Condition at discharge: stable  The results of significant diagnostics from this hospitalization (including imaging, microbiology, ancillary and laboratory) are listed below for reference.   Imaging Studies: DG Chest Portable 1 View  Result Date: 10/17/2021 CLINICAL DATA:  Shortness of breath EXAM: PORTABLE CHEST 1 VIEW COMPARISON:  Chest x-ray  dated July 21st 2023 FINDINGS: Unchanged cardiac and mediastinal contours. Spinal stimulator device lead overlying the thoracic spine. Mild bilateral heterogeneous opacities, similar to prior exam. Ill-defined right hemidiaphragm which is likely due to atelectasis. No large pleural effusion or pneumothorax. IMPRESSION: 1. Mild bilateral heterogeneous opacities, similar to prior exam and likely due to pulmonary edema. 2. Right basilar atelectasis. Electronically Signed   By: Yetta Glassman M.D.   On: 10/17/2021 14:30   DG Chest 2 View  Result Date: 10/10/2021 CLINICAL DATA:  Shortness of breath EXAM: CHEST - 2 VIEW COMPARISON:  03/21/2021, 02/25/2021, 07/10/2020 FINDINGS: Cardiomegaly. No pleural effusion. Mild diffuse heterogeneous interstitial opacity with mild patchy ground-glass opacity at the bases. No pneumothorax. Bilateral shoulder calcifications suggesting loose bodies. Ascending thoracic stimulator leads. IMPRESSION: Cardiomegaly with mild central congestion. Heterogeneous interstitial and ground-glass opacities could reflect low-grade edema though pneumonia could also produce this appearance. Electronically Signed   By: Donavan Foil M.D.   On: 10/10/2021 15:53    Microbiology: Results for orders placed or performed during the hospital encounter of 10/17/21  Resp Panel by RT-PCR (Flu A&B, Covid) Anterior Nasal Swab     Status: None   Collection Time: 10/17/21  2:10 PM   Specimen: Anterior Nasal Swab  Result Value Ref Range Status   SARS Coronavirus 2 by RT PCR NEGATIVE NEGATIVE Final    Comment: (NOTE) SARS-CoV-2 target nucleic acids are NOT DETECTED.  The SARS-CoV-2 RNA is generally detectable in upper respiratory specimens during the acute phase of infection. The lowest concentration of SARS-CoV-2 viral copies this assay can detect is 138 copies/mL. A negative result does not preclude SARS-Cov-2 infection and should not be used as the sole basis for treatment or other patient  management decisions. A negative result may occur with  improper specimen collection/handling, submission of specimen other than nasopharyngeal swab, presence of viral mutation(s) within the areas targeted by this assay, and inadequate number of viral copies(<138 copies/mL). A negative result must be combined with clinical observations, patient history, and epidemiological information. The expected result is Negative.  Fact Sheet for Patients:  EntrepreneurPulse.com.au  Fact Sheet for Healthcare Providers:  IncredibleEmployment.be  This test is no t yet approved or cleared by the [place] FDA and  has been authorized for detection and/or diagnosis of SARS-CoV-2 by FDA under an Emergency Use Authorization (EUA).  This EUA will remain  in effect (meaning this test can be used) for the duration of the COVID-19 declaration under Section 564(b)(1) of the Act, 21 U.S.C.section 360bbb-3(b)(1), unless the authorization is terminated  or revoked sooner.       Influenza A by PCR NEGATIVE NEGATIVE Final   Influenza B by PCR NEGATIVE NEGATIVE Final    Comment: (NOTE) The Xpert Xpress SARS-CoV-2/FLU/RSV plus assay is intended as an aid in the diagnosis of influenza from Nasopharyngeal swab specimens and should not be used as a sole basis for treatment. Nasal washings and aspirates are unacceptable for Xpert Xpress SARS-CoV-2/FLU/RSV testing.  Fact Sheet for Patients: EntrepreneurPulse.com.au  Fact Sheet for Healthcare Providers: IncredibleEmployment.be  This test is not yet approved or cleared by the [place] FDA and has been authorized for detection and/or diagnosis of SARS-CoV-2 by FDA under an Emergency Use Authorization (EUA). This EUA will remain in effect (meaning this test can be used) for the duration of the COVID-19 declaration under Section 564(b)(1) of the Act, 21 U.S.C. section 360bbb-3(b)(1),  unless the authorization is terminated or revoked.  Performed at [facility], [address] [place], [place] [address]     Labs: CBC: Recent Labs  Lab 10/17/21 1336 10/18/21 0042  WBC 7.6 8.4  NEUTROABS  --  5.4  HGB 14.5 16.3  HCT 43.7 46.3  MCV 95.6 94.1  PLT 231 865   Basic Metabolic Panel: Recent Labs  Lab 10/17/21 1336 10/18/21 0042  NA 137 142  K 4.5 3.8  CL 105 101  CO2 22 30  GLUCOSE 104* 126*  BUN 26* 24*  CREATININE 1.25* 1.41*  CALCIUM 8.9 9.3  MG  --  2.2  PHOS  --  4.3   Liver Function Tests: Recent Labs  Lab 10/17/21 1333 10/18/21 0042  AST 33 37  ALT 28 36  ALKPHOS 56 62  BILITOT 1.0 1.1  PROT 6.8 7.2  ALBUMIN 4.5 4.5   CBG: No results for input(s): "GLUCAP" in the last 168 hours.  Discharge time spent: greater than 30 minutes.  Signed: Tawni Millers, MD [facility] 10/18/2021

## 2021-10-18 NOTE — Assessment & Plan Note (Signed)
Renal function with serum cr at 1,41 with K at 3,8 and serum bicarbonate at 30 Plan to continue diuresis with furosemide, follow up renal function as outpatient.  Base serum cr is 1.3 to 1,4.

## 2021-10-18 NOTE — Progress Notes (Signed)
Pt is stating he "can't do this anymore" referring to staying inpatient. He is also complaining of inability to sleep and not continuing his home med list. He is becoming agitated. This RN has explained plan of care various times and importance of compliance. Notified attending and floor coverage MD of the above. One time order of pt's trazodone given. Pt continues to be restless but is not agitated or threatening to leave at this time. Fall precautions in place. Call light in reach.

## 2021-10-18 NOTE — Assessment & Plan Note (Addendum)
Stable, follow up as outpatient. Continue with clopidogrel and statin.

## 2021-10-21 ENCOUNTER — Telehealth: Payer: Self-pay | Admitting: Internal Medicine

## 2021-10-21 NOTE — Telephone Encounter (Signed)
Per discharge summary, pt is to keep cardiology apt already scheduled on 12/08/21 and to f/u with pcp in 7-10 days. I spoke with patient, he will see his pcp tomorrow

## 2021-10-21 NOTE — Telephone Encounter (Signed)
Patient called to see if one of the PA or NP wanted to see him sooner that 9/18 due to hospital f/u. Please call back to confirm

## 2021-10-22 DIAGNOSIS — R0981 Nasal congestion: Secondary | ICD-10-CM | POA: Diagnosis not present

## 2021-10-22 DIAGNOSIS — I5022 Chronic systolic (congestive) heart failure: Secondary | ICD-10-CM | POA: Diagnosis not present

## 2021-10-22 DIAGNOSIS — F419 Anxiety disorder, unspecified: Secondary | ICD-10-CM | POA: Diagnosis not present

## 2021-10-22 DIAGNOSIS — G4700 Insomnia, unspecified: Secondary | ICD-10-CM | POA: Diagnosis not present

## 2021-10-22 DIAGNOSIS — I493 Ventricular premature depolarization: Secondary | ICD-10-CM | POA: Diagnosis not present

## 2021-10-28 ENCOUNTER — Ambulatory Visit: Payer: PPO | Admitting: Internal Medicine

## 2021-11-10 DIAGNOSIS — Z79899 Other long term (current) drug therapy: Secondary | ICD-10-CM | POA: Diagnosis not present

## 2021-11-19 DIAGNOSIS — G894 Chronic pain syndrome: Secondary | ICD-10-CM | POA: Diagnosis not present

## 2021-11-19 DIAGNOSIS — F1120 Opioid dependence, uncomplicated: Secondary | ICD-10-CM | POA: Diagnosis not present

## 2021-11-19 DIAGNOSIS — Z9689 Presence of other specified functional implants: Secondary | ICD-10-CM | POA: Diagnosis not present

## 2021-11-23 ENCOUNTER — Other Ambulatory Visit: Payer: Self-pay | Admitting: Physician Assistant

## 2021-12-01 DIAGNOSIS — R7303 Prediabetes: Secondary | ICD-10-CM | POA: Diagnosis not present

## 2021-12-01 DIAGNOSIS — I5022 Chronic systolic (congestive) heart failure: Secondary | ICD-10-CM | POA: Diagnosis not present

## 2021-12-01 DIAGNOSIS — R42 Dizziness and giddiness: Secondary | ICD-10-CM | POA: Diagnosis not present

## 2021-12-02 ENCOUNTER — Ambulatory Visit (HOSPITAL_COMMUNITY): Payer: PPO | Attending: Internal Medicine

## 2021-12-02 DIAGNOSIS — I350 Nonrheumatic aortic (valve) stenosis: Secondary | ICD-10-CM | POA: Diagnosis not present

## 2021-12-02 LAB — ECHOCARDIOGRAM COMPLETE
AR max vel: 0.61 cm2
AV Area VTI: 0.59 cm2
AV Area mean vel: 0.58 cm2
AV Mean grad: 37 mmHg
AV Peak grad: 62.3 mmHg
Ao pk vel: 3.95 m/s
Area-P 1/2: 4.06 cm2
Calc EF: 25.1 %
S' Lateral: 5.8 cm
Single Plane A2C EF: 21.5 %
Single Plane A4C EF: 28.5 %

## 2021-12-03 DIAGNOSIS — R454 Irritability and anger: Secondary | ICD-10-CM | POA: Diagnosis not present

## 2021-12-03 DIAGNOSIS — I5022 Chronic systolic (congestive) heart failure: Secondary | ICD-10-CM | POA: Diagnosis not present

## 2021-12-07 NOTE — Progress Notes (Unsigned)
**Note Ruben-Identified via Obfuscation** Cardiology Office Note:    Date:  12/08/2021   ID:  Ruben Barrera, DOB 1932/07/16, MRN [id number]  PCP:  Kathalene Frames, MD  [facility] Cardiologist:  Werner Lean, MD  [place] Electrophysiologist:  None   CC: Follow up hospitalization  History of Present Illness:    Ruben Barrera is a 88 y.o. male with a hx of HTN, HLD, Non-obstructive CAD without intervention, HFrEF,  LFLG-AS, right CVA stroke, frequent PVCs and SVT, and Tobacco Abuse (occasional cigar) who presented for evaluation 04/19/20.   2022: In interval had LHC 03/28/20 with diagnosis of CAD and AS.   EP eval- no plans for more aggressive NSVT therapy; per EP and given age no plans for ICD or CRT-P.  Had back surgery 07/10/20.  I had also discuss his MRA with Dr. Felipa Eth (we DC/ed this medication).  Since last visit he has had echo showing that is again consistent with Moderate to Severe LFLG AS.  He has had interval 12/22 admission and eval for infective endocarditis (saw Dr. Acie Fredrickson, Johney Frame, and [employer] in interim). 2023: has TEE with healed vegetations.  Has summer admission with SOB needing diuresis.  LVEF has further declined.  Patient notes that he is doing fine.   Since last visit notes that he is back to exercise biking four to five times a week. Dr. Sallyanne Kuster helped him with his Apple Watch and it is triggering less.  No chest pain or pressure. No SOB/DOE and no PND/Orthopnea. No weight gain or leg swelling. No palpitations or syncope.  Over the past two years he has never had any cardiac symptoms. He is still very active.   No fevers, chills, night sweats.   Past Medical History:  Diagnosis Date   Arthritis    Coronary artery disease    Depression    Dysrhythmia    "extra heart beat"   GERD (gastroesophageal reflux disease)    History of hiatal hernia    Hypercholesteremia    Hypertension    Lumbar spinal stenosis    RLS (restless legs syndrome)    takes ativan as needed    Stroke ([place]) 01/2020   minor - no deficits    Past Surgical History:  Procedure Laterality Date   BACK SURGERY     COLONOSCOPY     EYE SURGERY Bilateral    cataracts removed    LUMBAR LAMINECTOMY/DECOMPRESSION MICRODISCECTOMY N/A 08/28/2015   Procedure: Lumbar Four-Five decompressive lumbar laminectomy;  Surgeon: Jovita Gamma, MD;  Location: [facility];  Service: Neurosurgery;  Laterality: N/A;   REPLACEMENT TOTAL KNEE BILATERAL Bilateral 11/05/2003   RIGHT/LEFT HEART CATH AND CORONARY ANGIOGRAPHY N/A 03/28/2020   Procedure: RIGHT/LEFT HEART CATH AND CORONARY ANGIOGRAPHY;  Surgeon: Burnell Blanks, MD;  Location: [place] CV LAB;  Service: Cardiovascular;  Laterality: N/A;   TEE WITHOUT CARDIOVERSION N/A 02/28/2021   Procedure: TRANSESOPHAGEAL ECHOCARDIOGRAM (TEE);  Surgeon: Freada Bergeron, MD;  Location: [facility] ENDOSCOPY;  Service: Cardiovascular;  Laterality: N/A;   TEE WITHOUT CARDIOVERSION N/A 05/28/2021   Procedure: TRANSESOPHAGEAL ECHOCARDIOGRAM (TEE);  Surgeon: Werner Lean, MD;  Location: [facility] ENDOSCOPY;  Service: Cardiovascular;  Laterality: N/A;   THORACIC DISCECTOMY N/A 07/10/2020   Procedure: Spinal cord stimulator via  - Thoracic Eight-Thoracic Nine, Thoracic Nine-Thoracic Ten Laminectomy;  Surgeon: Eustace Moore, MD;  Location: [place];  Service: Neurosurgery;  Laterality: N/A;  3C   TOE AMPUTATION Right 2010   second toe   TONSILLECTOMY  Current Medications: Current Meds  Medication Sig   citalopram (CELEXA) 20 MG tablet Take 20 mg by mouth daily.   clopidogrel (PLAVIX) 75 MG tablet Take 1 tablet (75 mg total) by mouth daily.   ezetimibe (ZETIA) 10 MG tablet Take 1 tablet (10 mg total) by mouth every evening.   fluticasone (FLONASE) 50 MCG/ACT nasal spray Place 1 spray into both nostrils daily as needed for allergies.    furosemide (LASIX) 20 MG tablet Take 1 tablet (20 mg total) by mouth daily. Take 2 tablets daily in case of weight gain 2 to 3  lbs in 24 hrs or 5 lbs in 7 days.   gabapentin (NEURONTIN) 600 MG tablet Take 600-1,200 mg by mouth every morning.   HYDROcodone-acetaminophen (NORCO) 7.5-325 MG tablet Take 1 tablet by mouth every 8 (eight) hours as needed for moderate pain.   JARDIANCE 10 MG TABS tablet Take 10 mg by mouth daily.   LORazepam (ATIVAN) 1 MG tablet Take 1 mg by mouth at bedtime as needed for sleep.   metoprolol succinate (TOPROL-XL) 25 MG 24 hr tablet Take 0.5 tablets (12.5 mg total) by mouth daily.   MULTIPLE VITAMIN PO Take 1 tablet by mouth daily.    omeprazole (PRILOSEC) 20 MG capsule Take 20 mg by mouth daily.   pravastatin (PRAVACHOL) 20 MG tablet daily.   senna-docusate (SENOKOT-S) 8.6-50 MG tablet Take 1 tablet by mouth 2 (two) times daily.   tamsulosin (FLOMAX) 0.4 MG CAPS capsule Take 1 capsule (0.4 mg total) by mouth daily.   traZODone (DESYREL) 50 MG tablet Take 25 mg by mouth at bedtime as needed for sleep.   triamcinolone (KENALOG) 0.1 % Apply 1 application topically daily as needed (irritation).     Allergies:   Simvastatin, Atorvastatin, Benazepril hcl, Losartan, Pravastatin, Rosuvastatin, and Spironolactone   Social History   Socioeconomic History   Marital status: Married    Spouse name: Not on file   Number of children: Not on file   Years of education: Not on file   Highest education level: Not on file  Occupational History   Not on file  Tobacco Use   Smoking status: Some Days    Types: Cigars   Smokeless tobacco: Never   Tobacco comments:    quit 1972- currently 1 cigar per week  Vaping Use   Vaping Use: Never used  Substance and Sexual Activity   Alcohol use: Yes    Comment: very occasionally beer/wine or scotch   Drug use: No    Comment: 1 cigar per week   Sexual activity: Not Currently  Other Topics Concern   Not on file  Social History Narrative   Right handed   Lives with wife one story home   Social Determinants of Health   Financial Resource Strain: Not on  file  Food Insecurity: Not on file  Transportation Needs: Not on file  Physical Activity: Not on file  Stress: Not on file  Social Connections: Not on file    Social: Wife Comes to visit and takes notes for him, Former Teacher, English as a foreign language of [employer]; rin 2022 65th wedding anniversary  (2022), former high school quarterback  Family History: The patient's family history includes Hypertension in an other family member; Stroke in his mother.  No history of congestive heart failure  ROS:   Please see the history of present illness.    All other systems reviewed and are negative.  EKGs/Labs/Other Studies Reviewed:    The following studies were reviewed  today:  EKG:   02/21/20:  Sinus Rhythm Rate 84 with Rare PVCs, and PACs, QTc 514 ms  Cardiac Event Monitoring: Date 03/25/2020 Personally reviewed Results:   Patient had a minimum heart rate of 44 bpm, maximum heart rate of 171 bpm, and average heart rate of 68 bpm. Predominant underlying rhythm was sinus rhythm. Three runs of ventricular tachycardia occurred lasting 11 beats at longest with a max rate of 143 bpm. Eighty-seven runs of supraventricular tachycardia occurred, lasting 17.1 seconds at longest with a max rate of 171 bpm at fastest. Isolated PACs were occasional (4.2%), with rare couplets and triplets present. Isolated PVCs were frequent (5.1%), with rare couplets, triplets, bigeminy, and trigeminy present. No evidence of complete heart block. No triggered and diary events.   Frequent PVCs, SVT, with runs of NSVT.   ECHO COMPLETE WO IMAGING ENHANCING AGENT 12/02/2021  Narrative ECHOCARDIOGRAM REPORT    Patient Name:   ELENA COTHERN Date of Exam: 12/02/2021 Medical Rec #:  [id number]      Height:       71.0 in Accession #:    [id number]     Weight:       173.1 lb Date of Birth:  July 13, 1932       BSA:          1.983 m Patient Age:    52 years       BP:           149/96 mmHg Patient Gender: M              HR:           71  bpm. Exam Location:  [address]  Procedure: 2D Echo, Color Doppler and Cardiac Doppler  Indications:    Moderate to severe Aortic Stenosis I35.0  History:        Patient has prior history of Echocardiogram examinations, most recent 02/25/2021. CAD, Aortic Valve Disease; Risk Factors:Hypertension.  Sonographer:    Mikki Santee RDCS Referring Phys: [facility] A Magda Muise  IMPRESSIONS   1. Aortic stenosis is now severe. 2. Left ventricular ejection fraction, by estimation, is 20 to 25%. The left ventricle has severely decreased function. The left ventricle has no regional wall motion abnormalities. There is mild left ventricular hypertrophy. Left ventricular diastolic parameters are consistent with Grade II diastolic dysfunction (pseudonormalization). 3. Right ventricular systolic function is normal. The right ventricular size is normal. 4. Left atrial size was moderately dilated. 5. The mitral valve is normal in structure. Mild mitral valve regurgitation. No evidence of mitral stenosis. 6. DI - 0.13 severe. The aortic valve is normal in structure. Aortic valve regurgitation is not visualized. Severe aortic valve stenosis. Aortic valve area, by VTI measures 0.59 cm. Aortic valve mean gradient measures 37.0 mmHg. Aortic valve Vmax measures 3.94 m/s. 7. The inferior vena cava is normal in size with greater than 50% respiratory variability, suggesting right atrial pressure of 3 mmHg.  Comparison(s): Prior images reviewed side by side. The left ventricular function is worsened. Prior mean gradient 23 mmHg.  FINDINGS Left Ventricle: Left ventricular ejection fraction, by estimation, is 20 to 25%. The left ventricle has severely decreased function. The left ventricle has no regional wall motion abnormalities. The left ventricular internal cavity size was normal in size. There is mild left ventricular hypertrophy. Left ventricular diastolic parameters are consistent with Grade II  diastolic dysfunction (pseudonormalization).  Right Ventricle: The right ventricular size is normal. No increase in right ventricular wall thickness. Right ventricular  systolic function is normal.  Left Atrium: Left atrial size was moderately dilated.  Right Atrium: Right atrial size was normal in size.  Pericardium: There is no evidence of pericardial effusion.  Mitral Valve: The mitral valve is normal in structure. Mild mitral valve regurgitation. No evidence of mitral valve stenosis.  Tricuspid Valve: The tricuspid valve is normal in structure. Tricuspid valve regurgitation is mild . No evidence of tricuspid stenosis.  Aortic Valve: DI - 0.13 severe. The aortic valve is normal in structure. Aortic valve regurgitation is not visualized. Severe aortic stenosis is present. Aortic valve mean gradient measures 37.0 mmHg. Aortic valve peak gradient measures 62.3 mmHg. Aortic valve area, by VTI measures 0.59 cm.  Pulmonic Valve: The pulmonic valve was normal in structure. Pulmonic valve regurgitation is trivial. No evidence of pulmonic stenosis.  Aorta: The aortic root is normal in size and structure.  Venous: The inferior vena cava was not well visualized. The inferior vena cava is normal in size with greater than 50% respiratory variability, suggesting right atrial pressure of 3 mmHg.  IAS/Shunts: No atrial level shunt detected by color flow Doppler.   LEFT VENTRICLE PLAX 2D LVIDd:         6.50 cm      Diastology LVIDs:         5.80 cm      LV e' medial:    3.83 cm/s LV PW:         1.10 cm      LV E/e' medial:  24.4 LV IVS:        1.20 cm      LV e' lateral:   5.85 cm/s LVOT diam:     2.30 cm      LV E/e' lateral: 15.9 LV SV:         55 LV SV Index:   28 LVOT Area:     4.15 cm  LV Volumes (MOD) LV vol d, MOD A2C: 130.0 ml LV vol d, MOD A4C: 172.0 ml LV vol s, MOD A2C: 102.0 ml LV vol s, MOD A4C: 123.0 ml LV SV MOD A2C:     28.0 ml LV SV MOD A4C:     172.0 ml LV SV MOD BP:       37.6 ml  RIGHT VENTRICLE RV Basal diam:  3.40 cm RV Mid diam:    3.50 cm RV S prime:     13.70 cm/s TAPSE (M-mode): 1.5 cm  LEFT ATRIUM              Index        RIGHT ATRIUM           Index LA diam:        5.20 cm  2.62 cm/m   RA Area:     19.00 cm LA Vol (A2C):   115.0 ml 57.99 ml/m  RA Volume:   50.00 ml  25.21 ml/m LA Vol (A4C):   99.2 ml  50.02 ml/m LA Biplane Vol: 114.0 ml 57.49 ml/m AORTIC VALVE AV Area (Vmax):    0.61 cm AV Area (Vmean):   0.58 cm AV Area (VTI):     0.59 cm AV Vmax:           394.50 cm/s AV Vmean:          286.500 cm/s AV VTI:            0.935 m AV Peak Grad:      62.3 mmHg AV Mean Grad:  37.0 mmHg LVOT Vmax:         58.05 cm/s LVOT Vmean:        40.200 cm/s LVOT VTI:          0.133 m LVOT/AV VTI ratio: 0.14  AORTA Ao Root diam: 3.40 cm Ao Asc diam:  3.20 cm  MITRAL VALVE               TRICUSPID VALVE MV Area (PHT): 4.06 cm    TR Peak grad:   30.9 mmHg MV Decel Time: 187 msec    TR Vmax:        278.00 cm/s MV E velocity: 93.30 cm/s MV A velocity: 68.70 cm/s  SHUNTS MV E/A ratio:  1.36        Systemic VTI:  0.13 m Systemic Diam: 2.30 cm  [name] MD Electronically signed by [name] MD Signature Date/Time: 12/02/2021/2:44:58 PM    Final     NM Stress Testing : Date: 02/21/2020 Results:  Notes Recorded by Dorothy Spark, MD on 05/10/2014 at 2:50 PM Normal stress test  Left/Right Heart Catheterizations: Date: 03/28/2020 Results: Prox Cx lesion is 30% stenosed. Prox LAD to Mid LAD lesion is 60% stenosed. Dist LAD lesion is 60% stenosed.   1. The LAD is a large vessel that courses to the apex. Moderate, heavily calcified proximal to mid stenosis that does is eccentric and does not appear to flow limiting. Focal mid to distal moderate stenosis.  2. Moderate disease in the intermediate branch 3. Mild plaque in the Circumflex 4. Large, dominant RCA with no obstructive disease 5. Low flow/low gradient moderate to  severe aortic stenosis (Cath data: Mean gradient 16.38mHg, peak to peak gradient 16 mmHg). By echo his dimensionless index is 0.28 and SVI is 40.    Recommendations: He appears to have low flow/low gradient moderate to severe aortic stenosis. He has moderate CAD but in the absence of angina, would not treat the calcific disease in the proximal and mid LAD. He is asymptomatic at this time.    Recent Labs: 10/17/2021: B Natriuretic Peptide 1,366.3 10/18/2021: ALT 36; BUN 24; Creatinine, Ser 1.41; Hemoglobin 16.3; Magnesium 2.2; Platelets 281; Potassium 3.8; Sodium 142  Recent Lipid Panel    Component Value Date/Time   CHOL 204 (H) 02/21/2020 0253   TRIG 199 (H) 02/21/2020 0253   HDL 42 02/21/2020 0253   CHOLHDL 4.9 02/21/2020 0253   VLDL 40 02/21/2020 0253   LDLCALC 122 (H) 02/21/2020 0253     Physical Exam:    VS:  BP (!) 98/56   Pulse 63   Ht '5\' 10"'$  (1.778 m)   Wt 172 lb 6.4 oz (78.2 kg)   SpO2 96%   BMI 24.74 kg/m     Wt Readings from Last 3 Encounters:  12/08/21 172 lb 6.4 oz (78.2 kg)  10/18/21 173 lb 1.6 oz (78.5 kg)  10/06/21 180 lb (81.6 kg)    GEN: Elderly Male HEENT: Normal NECK: No JVD CARDIAC:  III/VI systolic crescendo murmur without rubs or gallops, late S2, IRIR beats RESPIRATORY:  Clear to auscultation without rales, wheezing or rhonchi  ABDOMEN: Soft, non-tender, non-distended MUSCULOSKELETAL:  trivial bilateral edema; No deformity  SKIN: Warm and dry NEUROLOGIC:  Alert and oriented x 3 PSYCHIATRIC:  Normal affect   ASSESSMENT:    1. Moderate to severe aortic stenosis   2. Coronary artery disease due to lipid rich plaque   3. Frequent PVCs   4. HFrEF (heart failure with  reduced ejection fraction) (HCC)     PLAN:     Heart Failure Reduced Ejection Fraction  Low Flow Low Gradient  moderate to severe aortic stenosis - NYHA class I, Stage B, euvolemic, etiology likely multifocal;  new drop in LVEF may be related to LVLG-AS - unable to tolerate BB  for symptomatic bradycardia; while he does not remember this I do not believe he would tolerate more that his low dose BB - ARNI and ARB not tolerated in the past due to hypotension; I am not sure of the accuracy of his 7/17 BP or his BP at time of echo; we have used manual cuffs in the past and he has often had low blood pressures - MRA stopped by PCP in the past because of symptomatic hypotension - He is truly appears to be asymptomatic but I do not have another good reason for him have a significant drop in LVEF; he would be will to meet our structural team, will refer  Coronary Artery Disease; Nonobstructive HLD - asymptomatic - anatomy: Prox LAD 60%, mid LAD 60%, prox Cir 30% - continue ASA 81 mg - goal LDL < 70 (multiple statin intolerances in the past) - continue Zetia and pravastatin  NSVT and Frequent PVCs - seen by  EP, no plans for further changes  Tobacco Abuse-> - Down to two cigars a week (Pre-Contemplative for further cessation)  If no plans for structural procedure, will plan 6 months; if further HF exacerbations will add digoxin (low dose)  Time Spent Directly with Patient:   I have spent a total of 40 minutes with the patient reviewing notes, imaging, EKGs, labs and examining the patient as well as establishing an assessment and plan that was discussed personally with the patient.  > 50% of time was spent in direct patient care and wife and reviewing imaging with patient.   Medication Adjustments/Labs and Tests Ordered: Current medicines are reviewed at length with the patient today.  Concerns regarding medicines are outlined above.  Orders Placed This Encounter  Procedures   EKG 12-Lead    No orders of the defined types were placed in this encounter.    Patient Instructions  Medication Instructions:  Your physician recommends that you continue on your current medications as directed. Please refer to the Current Medication list given to you today.  *If you  need a refill on your cardiac medications before your next appointment, please call your pharmacy*   Lab Work: NONE If you have labs (blood work) drawn today and your tests are completely normal, you will receive your results only by: [place] (if you have MyChart) OR A paper copy in the mail If you have any lab test that is abnormal or we need to change your treatment, we will call you to review the results.   Testing/Procedures: Your physician has referred you to see a Dr. Angelena Form for a Consult.    Follow-Up: To be determined At [facility], you and your health needs are our priority.  As part of our continuing mission to provide you with exceptional heart care, we have created designated Provider Care Teams.  These Care Teams include your primary Cardiologist (physician) and Advanced Practice Providers (APPs -  Physician Assistants and Nurse Practitioners) who all work together to provide you with the care you need, when you need it.  Other Instructions   Important Information About Sugar         Signed, Werner Lean, MD  12/08/2021  12:50 PM    [facility]

## 2021-12-08 ENCOUNTER — Encounter: Payer: Self-pay | Admitting: Internal Medicine

## 2021-12-08 ENCOUNTER — Ambulatory Visit: Payer: PPO | Attending: Internal Medicine | Admitting: Internal Medicine

## 2021-12-08 VITALS — BP 98/56 | HR 63 | Ht 70.0 in | Wt 172.4 lb

## 2021-12-08 DIAGNOSIS — I471 Supraventricular tachycardia: Secondary | ICD-10-CM

## 2021-12-08 DIAGNOSIS — I493 Ventricular premature depolarization: Secondary | ICD-10-CM

## 2021-12-08 DIAGNOSIS — I5022 Chronic systolic (congestive) heart failure: Secondary | ICD-10-CM | POA: Diagnosis not present

## 2021-12-08 DIAGNOSIS — I350 Nonrheumatic aortic (valve) stenosis: Secondary | ICD-10-CM

## 2021-12-08 DIAGNOSIS — I2583 Coronary atherosclerosis due to lipid rich plaque: Secondary | ICD-10-CM

## 2021-12-08 DIAGNOSIS — I5020 Unspecified systolic (congestive) heart failure: Secondary | ICD-10-CM

## 2021-12-08 DIAGNOSIS — I2510 Atherosclerotic heart disease of native coronary artery without angina pectoris: Secondary | ICD-10-CM | POA: Diagnosis not present

## 2021-12-08 DIAGNOSIS — E782 Mixed hyperlipidemia: Secondary | ICD-10-CM | POA: Diagnosis not present

## 2021-12-08 NOTE — Patient Instructions (Addendum)
Medication Instructions:  Your physician recommends that you continue on your current medications as directed. Please refer to the Current Medication list given to you today.  *If you need a refill on your cardiac medications before your next appointment, please call your pharmacy*   Lab Work: NONE If you have labs (blood work) drawn today and your tests are completely normal, you will receive your results only by: [place] (if you have MyChart) OR A paper copy in the mail If you have any lab test that is abnormal or we need to change your treatment, we will call you to review the results.   Testing/Procedures: Your physician has referred you to see a Dr. Angelena Form for a Consult.    Follow-Up: To be determined At [facility], you and your health needs are our priority.  As part of our continuing mission to provide you with exceptional heart care, we have created designated Provider Care Teams.  These Care Teams include your primary Cardiologist (physician) and Advanced Practice Providers (APPs -  Physician Assistants and Nurse Practitioners) who all work together to provide you with the care you need, when you need it.  Other Instructions   Important Information About Sugar

## 2021-12-14 NOTE — Progress Notes (Signed)
[facility] Consult Note  Chief Complaint  Patient presents with   New Patient (Initial Visit)    Severe aortic stenosis   History of Present Illness: 88 yo male with history of moderate CAD, non-ischemic cardiomyopathy, depression, GERD, hiatal hernia, HTN, hyperlipidemia, prior CVA, SVT, PVCs and aortic stenosis who is here today as a new consult, referred by Dr. Gasper Sells, for further discussion regarding his aortic stenosis and possible TAVR. He has been followed by Dr. Gasper Sells for low flow/low gradient aortic stenosis. Echo 12/02/21 with LVEF=20-25%, mild MR. The aortic valve is thickened with poor leaflet mobility. Mean gradient 37 mmHg, peak gradient 62 mmHg. AVA 0.59 cm2, DI 0.13. This is consistent with severe AS. His LV function has been slowly falling over the past year. His LVEF was 40% by echo in September 2022. Cardiac cath January 2022 with moderate calcified CAD (50-60% proximal to mid LAD stenosis, 50-60% distal LAD stenosis, 50% intermediate branch stenosis, 30% proximal Circumflex stenosis).   He tells me today that he feels well. Her does have progressive fatigue. He has no chest pain, dyspnea, dizziness, near syncope or LE edema. He lives in [place], [place] with his wife. 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 He has partial dentures. He is here today with his wife.   Primary Care Physician: Kathalene Frames, MD Primary Cardiologist: Gasper Sells Referring Cardiologist: Gasper Sells  Past Medical History:  Diagnosis Date   Arthritis    Coronary artery disease    Depression    Dysrhythmia    "extra heart beat"   GERD (gastroesophageal reflux disease)    History of hiatal hernia    Hypercholesteremia    Hypertension    Lumbar spinal stenosis    RLS (restless legs syndrome)    takes ativan as needed   Stroke ([place]) 01/2020   minor - no deficits    Past Surgical History:  Procedure Laterality Date   BACK SURGERY     COLONOSCOPY      EYE SURGERY Bilateral    cataracts removed    LUMBAR LAMINECTOMY/DECOMPRESSION MICRODISCECTOMY N/A 08/28/2015   Procedure: Lumbar Four-Five decompressive lumbar laminectomy;  Surgeon: Jovita Gamma, MD;  Location: [facility];  Service: Neurosurgery;  Laterality: N/A;   REPLACEMENT TOTAL KNEE BILATERAL Bilateral 11/05/2003   RIGHT/LEFT HEART CATH AND CORONARY ANGIOGRAPHY N/A 03/28/2020   Procedure: RIGHT/LEFT HEART CATH AND CORONARY ANGIOGRAPHY;  Surgeon: Burnell Blanks, MD;  Location: [facility];  Service: Cardiovascular;  Laterality: N/A;   TEE WITHOUT CARDIOVERSION N/A 02/28/2021   Procedure: TRANSESOPHAGEAL ECHOCARDIOGRAM (TEE);  Surgeon: Freada Bergeron, MD;  Location[facility];  Service: Cardiovascular;  Laterality: N/A;   TEE WITHOUT CARDIOVERSION N/A 05/28/2021   Procedure: TRANSESOPHAGEAL ECHOCARDIOGRAM (TEE);  Surgeon: Werner Lean, MD;  Location: [facility] ENDOSCOPY;  Service: Cardiovascular;  Laterality: N/A;   THORACIC DISCECTOMY N/A 07/10/2020   Procedure: Spinal cord stimulator via  - Thoracic Eight-Thoracic Nine, Thoracic Nine-Thoracic Ten Laminectomy;  Surgeon: Eustace Moore, MD;  Location: [facility];  Service: Neurosurgery;  Laterality: N/A;  3C   TOE AMPUTATION Right 2010   second toe   TONSILLECTOMY      Current Outpatient Medications  Medication Sig Dispense Refill   citalopram (CELEXA) 20 MG tablet Take 20 mg by mouth daily.     clopidogrel (PLAVIX) 75 MG tablet Take 1 tablet (75 mg total) by mouth daily. 30 tablet 5   ezetimibe (ZETIA) 10 MG tablet Take 1 tablet (10 mg total)  by mouth every evening. 30 tablet 0   fluticasone (FLONASE) 50 MCG/ACT nasal spray Place 1 spray into both nostrils daily as needed for allergies.      furosemide (LASIX) 20 MG tablet Take 1 tablet (20 mg total) by mouth daily. Take 2 tablets daily in case of weight gain 2 to 3 lbs in 24 hrs or 5 lbs in 7 days. 30 tablet 0   gabapentin (NEURONTIN) 600 MG tablet Take  600-1,200 mg by mouth every morning.     HYDROcodone-acetaminophen (NORCO) 7.5-325 MG tablet Take 1 tablet by mouth every 8 (eight) hours as needed for moderate pain. 20 tablet 0   JARDIANCE 10 MG TABS tablet Take 10 mg by mouth daily.     LORazepam (ATIVAN) 1 MG tablet Take 1 mg by mouth at bedtime as needed for sleep.     metoprolol succinate (TOPROL-XL) 25 MG 24 hr tablet Take 0.5 tablets (12.5 mg total) by mouth daily. 15 tablet 0   MULTIPLE VITAMIN PO Take 1 tablet by mouth daily.      omeprazole (PRILOSEC) 20 MG capsule Take 20 mg by mouth daily.     pravastatin (PRAVACHOL) 20 MG tablet daily.     senna-docusate (SENOKOT-S) 8.6-50 MG tablet Take 1 tablet by mouth 2 (two) times daily.     tamsulosin (FLOMAX) 0.4 MG CAPS capsule Take 1 capsule (0.4 mg total) by mouth daily. 30 capsule 0   traZODone (DESYREL) 50 MG tablet Take 25 mg by mouth at bedtime as needed for sleep.     triamcinolone (KENALOG) 0.1 % Apply 1 application topically daily as needed (irritation).     No current facility-administered medications for this visit.    Allergies  Allergen Reactions   Simvastatin Other (See Comments)    Leg pains    Atorvastatin Other (See Comments)    Muscle aches   Benazepril Hcl Other (See Comments)    Pt does not know reaction    Losartan Other (See Comments)    Pt does not have reaction    Pravastatin Other (See Comments)    Leg pains    Rosuvastatin Other (See Comments)    Leg pains    Spironolactone Other (See Comments)    Pt does not reaction     Social History   Socioeconomic History   Marital status: Married    Spouse name: Not on file   Number of children: 2   Years of education: Not on file   Highest education level: Not on file  Occupational History   Occupation: Customer service manager  Tobacco Use   Smoking status: Some Days    Types: Cigars   Smokeless tobacco: Never   Tobacco comments:    quit 1972- currently 1 cigar per week  Vaping Use   Vaping Use:  Never used  Substance and Sexual Activity   Alcohol use: Yes    Comment: very occasionally beer/wine or scotch   Drug use: No    Comment: 1 cigar per week   Sexual activity: Not Currently  Other Topics Concern   Not on file  Social History Narrative   Right handed   Lives with wife one story home   Social Determinants of Health   Financial Resource Strain: Not on file  Food Insecurity: Not on file  Transportation Needs: Not on file  Physical Activity: Not on file  Stress: Not on file  Social Connections: Not on file  Intimate Partner Violence: Not on file  Family History  Problem Relation Age of Onset   Stroke Mother    Hypertension Other        family history    Review of Systems:  As stated in the HPI and otherwise negative.   BP 90/60   Pulse (!) 50   Ht '5\' 10"'$  (1.778 m)   Wt 173 lb 3.2 oz (78.6 kg)   SpO2 97%   BMI 24.85 kg/m   Physical Examination: General: Well developed, well nourished, NAD  HEENT: OP clear, mucus membranes moist  SKIN: warm, dry. No rashes. Neuro: No focal deficits  Musculoskeletal: Muscle strength 5/5 all ext  Psychiatric: Mood and affect normal  Neck: No JVD, no carotid bruits, no thyromegaly, no lymphadenopathy.  Lungs:Clear bilaterally, no wheezes, rhonci, crackles Cardiovascular: Regular rate and rhythm. Systolic murmur.  Abdomen:Soft. Bowel sounds present. Non-tender.  Extremities: No lower extremity edema. Pulses are 2 + in the bilateral DP/PT.  EKG:  EKG is not ordered today. The ekg from 12/08/21 show sinus, PVCs, Non-specific  T wave abn  Echo 12/02/21:  1. Aortic stenosis is now severe.   2. Left ventricular ejection fraction, by estimation, is 20 to 25%. The  left ventricle has severely decreased function. The left ventricle has no  regional wall motion abnormalities. There is mild left ventricular  hypertrophy. Left ventricular diastolic  parameters are consistent with Grade II diastolic dysfunction   (pseudonormalization).   3. Right ventricular systolic function is normal. The right ventricular  size is normal.   4. Left atrial size was moderately dilated.   5. The mitral valve is normal in structure. Mild mitral valve  regurgitation. No evidence of mitral stenosis.   6. DI - 0.13 severe. The aortic valve is normal in structure. Aortic  valve regurgitation is not visualized. Severe aortic valve stenosis.  Aortic valve area, by VTI measures 0.59 cm. Aortic valve mean gradient  measures 37.0 mmHg. Aortic valve Vmax  measures 3.94 m/s.   7. The inferior vena cava is normal in size with greater than 50%  respiratory variability, suggesting right atrial pressure of 3 mmHg.   Comparison(s): Prior images reviewed side by side. The left ventricular  function is worsened. Prior mean gradient 23 mmHg.   FINDINGS   Left Ventricle: Left ventricular ejection fraction, by estimation, is 20  to 25%. The left ventricle has severely decreased function. The left  ventricle has no regional wall motion abnormalities. The left ventricular  internal cavity size was normal in  size. There is mild left ventricular hypertrophy. Left ventricular  diastolic parameters are consistent with Grade II diastolic dysfunction  (pseudonormalization).   Right Ventricle: The right ventricular size is normal. No increase in  right ventricular wall thickness. Right ventricular systolic function is  normal.   Left Atrium: Left atrial size was moderately dilated.   Right Atrium: Right atrial size was normal in size.   Pericardium: There is no evidence of pericardial effusion.   Mitral Valve: The mitral valve is normal in structure. Mild mitral valve  regurgitation. No evidence of mitral valve stenosis.   Tricuspid Valve: The tricuspid valve is normal in structure. Tricuspid  valve regurgitation is mild . No evidence of tricuspid stenosis.   Aortic Valve: DI - 0.13 severe. The aortic valve is normal in  structure.  Aortic valve regurgitation is not visualized. Severe aortic stenosis is  present. Aortic valve mean gradient measures 37.0 mmHg. Aortic valve peak  gradient measures 62.3 mmHg. Aortic   valve area,  by VTI measures 0.59 cm.   Pulmonic Valve: The pulmonic valve was normal in structure. Pulmonic valve  regurgitation is trivial. No evidence of pulmonic stenosis.   Aorta: The aortic root is normal in size and structure.   Venous: The inferior vena cava was not well visualized. The inferior vena  cava is normal in size with greater than 50% respiratory variability,  suggesting right atrial pressure of 3 mmHg.   IAS/Shunts: No atrial level shunt detected by color flow Doppler.      LEFT VENTRICLE  PLAX 2D  LVIDd:         6.50 cm      Diastology  LVIDs:         5.80 cm      LV e' medial:    3.83 cm/s  LV PW:         1.10 cm      LV E/e' medial:  24.4  LV IVS:        1.20 cm      LV e' lateral:   5.85 cm/s  LVOT diam:     2.30 cm      LV E/e' lateral: 15.9  LV SV:         55  LV SV Index:   28  LVOT Area:     4.15 cm     LV Volumes (MOD)  LV vol d, MOD A2C: 130.0 ml  LV vol d, MOD A4C: 172.0 ml  LV vol s, MOD A2C: 102.0 ml  LV vol s, MOD A4C: 123.0 ml  LV SV MOD A2C:     28.0 ml  LV SV MOD A4C:     172.0 ml  LV SV MOD BP:      37.6 ml   RIGHT VENTRICLE  RV Basal diam:  3.40 cm  RV Mid diam:    3.50 cm  RV S prime:     13.70 cm/s  TAPSE (M-mode): 1.5 cm   LEFT ATRIUM              Index        RIGHT ATRIUM           Index  LA diam:        5.20 cm  2.62 cm/m   RA Area:     19.00 cm  LA Vol (A2C):   115.0 ml 57.99 ml/m  RA Volume:   50.00 ml  25.21 ml/m  LA Vol (A4C):   99.2 ml  50.02 ml/m  LA Biplane Vol: 114.0 ml 57.49 ml/m   AORTIC VALVE  AV Area (Vmax):    0.61 cm  AV Area (Vmean):   0.58 cm  AV Area (VTI):     0.59 cm  AV Vmax:           394.50 cm/s  AV Vmean:          286.500 cm/s  AV VTI:            0.935 m  AV Peak Grad:      62.3 mmHg  AV  Mean Grad:      37.0 mmHg  LVOT Vmax:         58.05 cm/s  LVOT Vmean:        40.200 cm/s  LVOT VTI:          0.133 m  LVOT/AV VTI ratio: 0.14     AORTA  Ao Root diam: 3.40 cm  Ao Asc diam:  3.20 cm   MITRAL VALVE               TRICUSPID VALVE  MV Area (PHT): 4.06 cm    TR Peak grad:   30.9 mmHg  MV Decel Time: 187 msec    TR Vmax:        278.00 cm/s  MV E velocity: 93.30 cm/s  MV A velocity: 68.70 cm/s  SHUNTS  MV E/A ratio:  1.36        Systemic VTI:  0.13 m                             Systemic Diam: 2.30 cm   Cardiac cath January 2022: Prox Cx lesion is 30% stenosed. Prox LAD to Mid LAD lesion is 60% stenosed. Dist LAD lesion is 60% stenosed.   1. The LAD is a large vessel that courses to the apex. Moderate, heavily calcified proximal to mid stenosis that does is eccentric and does not appear to flow limiting. Focal mid to distal moderate stenosis.  2. Moderate disease in the intermediate branch 3. Mild plaque in the Circumflex 4. Large, dominant RCA with no obstructive disease 5. Low flow/low gradient moderate to severe aortic stenosis (Cath data: Mean gradient 16.85mHg, peak to peak gradient 16 mmHg). By echo his dimensionless index is 0.28 and SVI is 40.    Recommendations: He appears to have low flow/low gradient moderate to severe aortic stenosis. He has moderate CAD but in the absence of angina, would not treat the calcific disease in the proximal and mid LAD. He is asymptomatic at this time. Will review results with Dr. CGasper Sellsand will decide if we should proceed with workup for TAVR at this time.    Recommendations  Antiplatelet/Anticoag He appears to have low flow/low gradient moderate to severe aortic stenosis. He has moderate CAD but in the absence of angina, would not treat the calcific disease in the proximal and mid LAD. He is asymptomatic at this time. Will review results with Dr. CGasper Sellsand will decide if we should proceed with workup for TAVR at this  time.   Indications  Nonrheumatic aortic valve stenosis [I35.0 (ICD-10-CM)]   Procedural Details  Technical Details Indication: 88yo male with history of cardiomyopathy, low flow/low gradient severe aortic stenosis.   Procedure: The risks, benefits, complications, treatment options, and expected outcomes were discussed with the patient. The patient and/or family concurred with the proposed plan, giving informed consent. The patient was brought to the cath lab after IV hydration was given. The patient was sedated with Versed and Fentanyl. I was unable to pass a wire from the IV catheter present in the right antecubital vein. The right groin was prepped and draped. Using u/s guidance, a 7 French sheath was placed in the right femoral vein. Right heart catheterization performed with a balloon tipped catheter. The right wrist was prepped and draped in a sterile fashion. 1% lidocaine was used for local anesthesia. Using the modified Seldinger access technique, a 5 French sheath was placed in the right radial artery. 3 mg Verapamil was given through the sheath. 4000 units IV heparin was given. Due to severe tortuosity in the innominate artery, I could not easily manipulate the catheters. I then placed a Destination sheath from the radial artery. I was able to engage the left main with a JL3.5 catheter but could not torque the JR4 catheter into the RCA. A 5 French sheath was  placed in the right femoral artery using u/s guidance. Due to severe tortuosity in the right common iliac artery, I placed a long 6 [place] Destination sheath into the descending aorta. I was then able to engage the RCA with a JR4 catheter. I crossed the aortic valve with an AL-1 and a straight wire. The sheath was removed from the right radial artery and a Terumo hemostasis band was applied at the arteriotomy site on the right wrist.    Estimated blood loss <50 mL.   During this procedure medications were administered to achieve and  maintain moderate conscious sedation while the patient's heart rate, blood pressure, and oxygen saturation were continuously monitored and I was present face-to-face 100% of this time.   Medications (Filter: Administrations occurring from [phone] to 1037 on 03/28/20)  important  Continuous medications are totaled by the amount administered until 03/28/20 1037.   Heparin (Porcine) in NaCl 1000-0.9 UT/500ML-% SOLN (mL) Total volume:  1,500 mL  Date/Time Rate/Dose/Volume Action   03/28/20 0850 500 mL Given   0850 500 mL Given   1035 500 mL Given    lidocaine (PF) (XYLOCAINE) 1 % injection (mL) Total volume:  40 mL  Date/Time Rate/Dose/Volume Action   03/28/20 0908 20 mL Given   1001 20 mL Given    Radial Cocktail/Verapamil only (mL) Total volume:  10 mL  Date/Time Rate/Dose/Volume Action   03/28/20 0924 10 mL Given    Radial Cocktail/Verapamil only (mL) Total volume:  10 mL  Date/Time Rate/Dose/Volume Action   03/28/20 0932 10 mL Given    heparin sodium (porcine) injection (Units) Total dose:  4,000 Units  Date/Time Rate/Dose/Volume Action   03/28/20 0938 4,000 Units Given    midazolam (VERSED) injection (mg) Total dose:  2 mg  Date/Time Rate/Dose/Volume Action   03/28/20 0905 1 mg Given   0946 1 mg Given    fentaNYL (SUBLIMAZE) injection (mcg) Total dose:  50 mcg  Date/Time Rate/Dose/Volume Action   03/28/20 0905 25 mcg Given   0946 25 mcg Given    Radial Cocktail/Verapamil only (mL) Total volume:  10 mL  Date/Time Rate/Dose/Volume Action   03/28/20 0949 10 mL Given    iohexol (OMNIPAQUE) 350 MG/ML injection (mL) Total volume:  75 mL  Date/Time Rate/Dose/Volume Action   03/28/20 1034 75 mL Given    Sedation Time  Sedation Time Physician-1: 1 hour 20 minutes Contrast  Medication Name Total Dose  iohexol (OMNIPAQUE) 350 MG/ML injection 75 mL   Radiation/Fluoro  Fluoro time: 25.6 (min) DAP: 26.7 (Gycm2) Cumulative Air Kerma: 403.4  (mGy) Complications  Complications documented before study signed (03/28/2020 10:49 AM)   RIGHT/LEFT HEART CATH AND CORONARY ANGIOGRAPHY  None Documented by Burnell Blanks, MD 03/28/2020 10:42 AM  Date Found: 03/28/2020  Time Range: Intraprocedure       Coronary Findings  Diagnostic Dominance: Right Left Anterior Descending  Vessel is large.  Prox LAD to Mid LAD lesion is 60% stenosed. The lesion is eccentric. The lesion is calcified.  Dist LAD lesion is 60% stenosed.    Left Circumflex  Prox Cx lesion is 30% stenosed.    Right Coronary Artery  Vessel is large.    Intervention   No interventions have been documented.   Coronary Diagrams  Diagnostic Dominance: Right  Intervention  Implants     No implant documentation for this case.   Syngo Images   Show images for CARDIAC CATHETERIZATION Images on Long Term Storage   Show images for [place], [place]  D Link to Procedure Log  Procedure Log    Hemo Data  Flowsheet Row Most Recent Value  Fick Cardiac Output 4.9 L/min  Fick Cardiac Output Index 2.63 (L/min)/BSA  Aortic Mean Gradient 16.3 mmHg  Aortic Peak Gradient 16 mmHg  Aortic Valve Area 1.24  Aortic Value Area Index 0.67 cm2/BSA  RA A Wave 5 mmHg  RA V Wave 5 mmHg  RA Mean 5 mmHg  RV Systolic Pressure 29 mmHg  RV Diastolic Pressure 3 mmHg  RV EDP 5 mmHg  PA Systolic Pressure 32 mmHg  PA Diastolic Pressure 17 mmHg  PA Mean 22 mmHg  PW A Wave 9 mmHg  PW V Wave 7 mmHg  PW Mean 5 mmHg  AO Systolic Pressure 263 mmHg  AO Diastolic Pressure 70 mmHg  AO Mean 95 mmHg  LV Systolic Pressure 785 mmHg  LV Diastolic Pressure 12 mmHg  LV EDP 16 mmHg  AOp Systolic Pressure 885 mmHg  AOp Diastolic Pressure 71 mmHg  AOp Mean Pressure 94 mmHg  LVp Systolic Pressure 027 mmHg  LVp Diastolic Pressure 5 mmHg  LVp EDP Pressure 11 mmHg  QP/QS 1  TPVR Index 8.38 HRUI  TSVR Index 36.16 HRUI  PVR SVR Ratio 0.19  TPVR/TSVR Ratio 0.23    Recent  Labs: 10/17/2021: B Natriuretic Peptide 1,366.3 10/18/2021: ALT 36; BUN 24; Creatinine, Ser 1.41; Hemoglobin 16.3; Magnesium 2.2; Platelets 281; Potassium 3.8; Sodium 142    Wt Readings from Last 3 Encounters:  12/15/21 173 lb 3.2 oz (78.6 kg)  12/08/21 172 lb 6.4 oz (78.2 kg)  10/18/21 173 lb 1.6 oz (78.5 kg)     Assessment and Plan:   1. Severe Aortic Valve Stenosis: He has severe, stage D2 low flow/low gradient aortic valve stenosis. NYHA class II. I have personally reviewed the echo images. The aortic valve is thickened, calcified with limited leaflet mobility. His LV function continues to fall with no other good reason for this. I think he would benefit from AVR. Given advanced age, he is not a good candidate for conventional AVR by surgical approach. I think he may be a good candidate for TAVR.   I have reviewed the natural history of aortic stenosis with the patient and their family members  who are present today. We have discussed the limitations of medical therapy and the poor prognosis associated with symptomatic aortic stenosis. We have reviewed potential treatment options, including palliative medical therapy, conventional surgical aortic valve replacement, and transcatheter aortic valve replacement. We discussed treatment options in the context of the patient's specific comorbid medical conditions.   He would like to proceed with planning for TAVR but he would like to think about this for a few days before scheduling the next step. I will not plan to repeat his cardiac cath. Risks and benefits of the valve procedure are reviewed with the patient. We will arrange pre TAVR CT scans and he will then be referred to see one of the CT surgeons on our TAVR team.   Labs/ tests ordered today include:  No orders of the defined types were placed in this encounter.  Disposition:   F/U with the valve team.    Signed, Lauree Chandler, MD, [facility] 12/15/2021 2:50 PM    [address], [place], [place]  [address] Phone: [phone]; Fax: [phone]

## 2021-12-15 ENCOUNTER — Encounter: Payer: Self-pay | Admitting: Cardiovascular Disease

## 2021-12-15 ENCOUNTER — Ambulatory Visit: Payer: PPO | Attending: Cardiovascular Disease | Admitting: Cardiovascular Disease

## 2021-12-15 VITALS — BP 90/60 | HR 50 | Ht 70.0 in | Wt 173.2 lb

## 2021-12-15 DIAGNOSIS — I350 Nonrheumatic aortic (valve) stenosis: Secondary | ICD-10-CM | POA: Diagnosis not present

## 2021-12-15 NOTE — Patient Instructions (Signed)
Medication Instructions:  No changes *If you need a refill on your cardiac medications before your next appointment, please call your pharmacy*   Lab Work: none If you have labs (blood work) drawn today and your tests are completely normal, you will receive your results only by: [place] (if you have MyChart) OR A paper copy in the mail If you have any lab test that is abnormal or we need to change your treatment, we will call you to review the results.   Testing/Procedures: None ordered today.   Follow-Up: Per Structural Heart Valve Team   Important Information About Sugar

## 2021-12-15 NOTE — Progress Notes (Addendum)
Pre Surgical Assessment: 5 M Walk Test  44M=16.29f  5 Meter Walk Test- trial 1: 6.06 seconds 5 Meter Walk Test- trial 2: 4.86 seconds 5 Meter Walk Test- trial 3: 4.58 seconds 5 Meter Walk Test Average: 5.16 seconds  Procedure Type: Isolated AVR Perioperative Outcome Estimate % Operative Mortality 5.48% Morbidity & Mortality 13.5% Stroke 2.08% Renal Failure 1.48% Reoperation 3.81% Prolonged Ventilation 8.05% Deep Sternal Wound Infection 0.037% [facility] 7.61% Short Hospital Stay (<6 days)* 25.4%

## 2021-12-20 DIAGNOSIS — F0630 Mood disorder due to known physiological condition, unspecified: Secondary | ICD-10-CM | POA: Diagnosis not present

## 2021-12-23 ENCOUNTER — Other Ambulatory Visit: Payer: Self-pay | Admitting: Physician Assistant

## 2021-12-23 ENCOUNTER — Encounter: Payer: Self-pay | Admitting: Physician Assistant

## 2021-12-23 DIAGNOSIS — I350 Nonrheumatic aortic (valve) stenosis: Secondary | ICD-10-CM

## 2021-12-24 DIAGNOSIS — I2510 Atherosclerotic heart disease of native coronary artery without angina pectoris: Secondary | ICD-10-CM | POA: Diagnosis not present

## 2021-12-24 DIAGNOSIS — I5020 Unspecified systolic (congestive) heart failure: Secondary | ICD-10-CM | POA: Diagnosis not present

## 2021-12-24 DIAGNOSIS — I350 Nonrheumatic aortic (valve) stenosis: Secondary | ICD-10-CM | POA: Diagnosis not present

## 2021-12-26 DIAGNOSIS — I5022 Chronic systolic (congestive) heart failure: Secondary | ICD-10-CM | POA: Diagnosis not present

## 2021-12-26 DIAGNOSIS — I350 Nonrheumatic aortic (valve) stenosis: Secondary | ICD-10-CM | POA: Diagnosis not present

## 2021-12-26 DIAGNOSIS — J40 Bronchitis, not specified as acute or chronic: Secondary | ICD-10-CM | POA: Diagnosis not present

## 2021-12-26 IMAGING — CT CT ANGIO HEAD
1 of 11 series · 14 of 47 positions shown · IV contrast (omnipaque)
Comparison: CT and brain MRI from yesterday.

CLINICAL DATA: Stroke workup

EXAM:
CT ANGIOGRAPHY HEAD AND NECK
TECHNIQUE: Multidetector CT imaging of the head and neck was performed using
the standard protocol during bolus administration of intravenous
contrast. Multiplanar CT image reconstructions and MIPs were
obtained to evaluate the vascular anatomy. Carotid stenosis
measurements (when applicable) are obtained utilizing NASCET
criteria, using the distal internal carotid diameter as the
denominator.
CONTRAST:  75mL OMNIPAQUE IOHEXOL 350 MG/ML SOLN

[Series 7: thin · axial · 0.49mm/px · z∈[-216,+62]mm · 14 of 642 slices shown]
[im 43/642  brain]
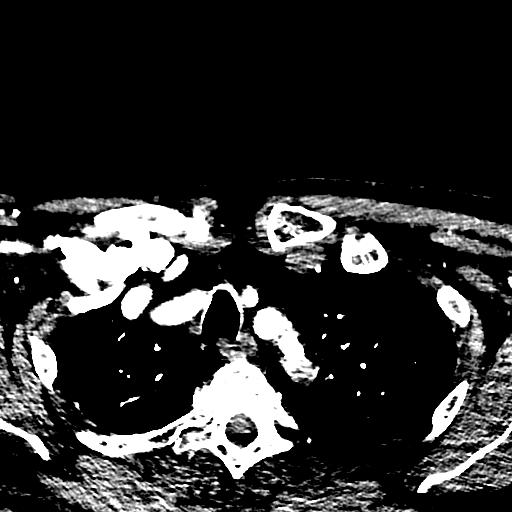
[im 86/642  bone]
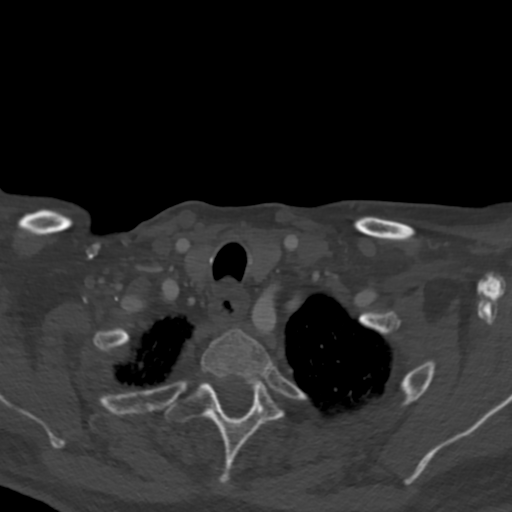
[im 129/642  brain]
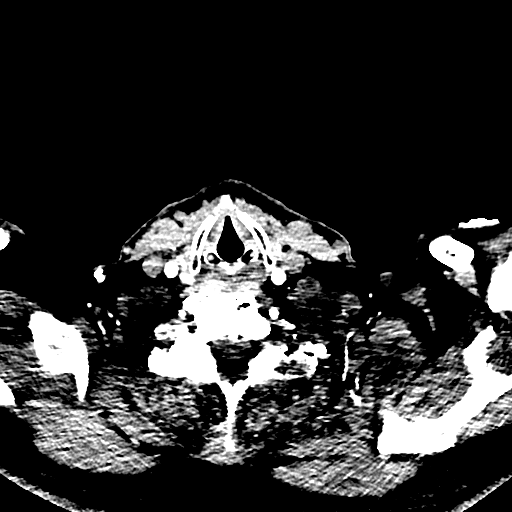
[im 171/642  bone]
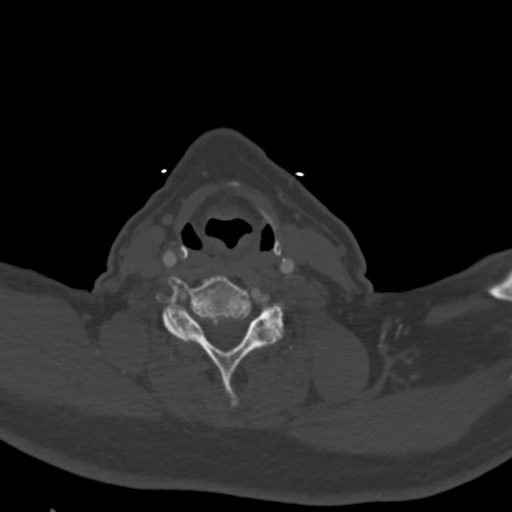
[im 214/642  brain]
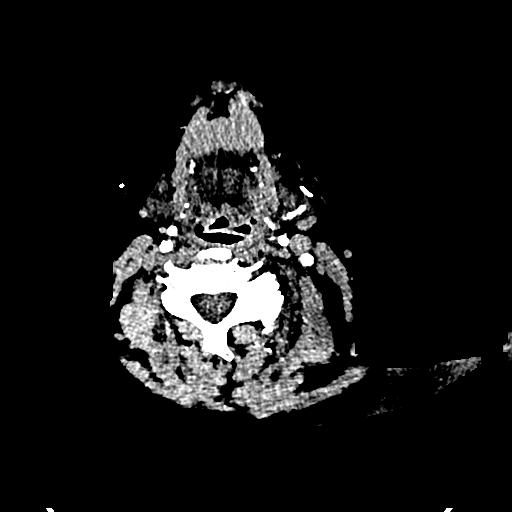
[im 257/642  bone]
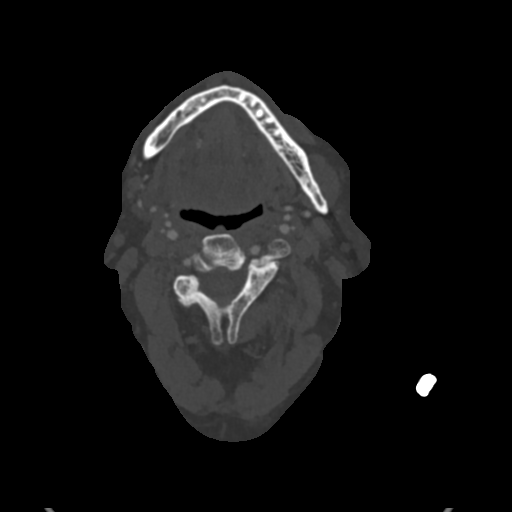
[im 300/642  brain]
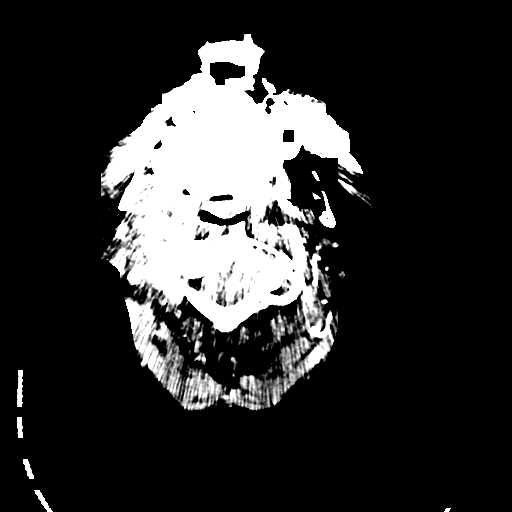
[im 342/642  bone]
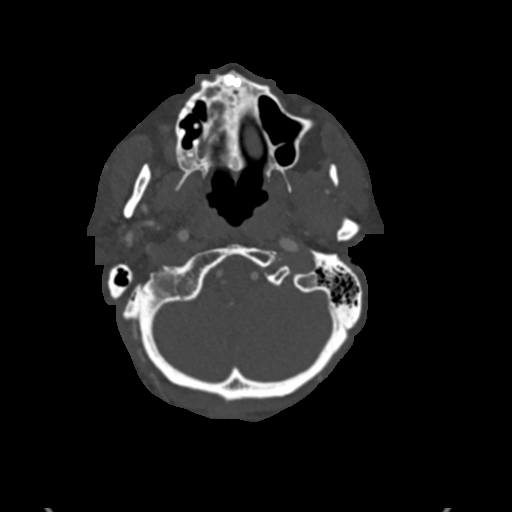
[im 385/642  brain]
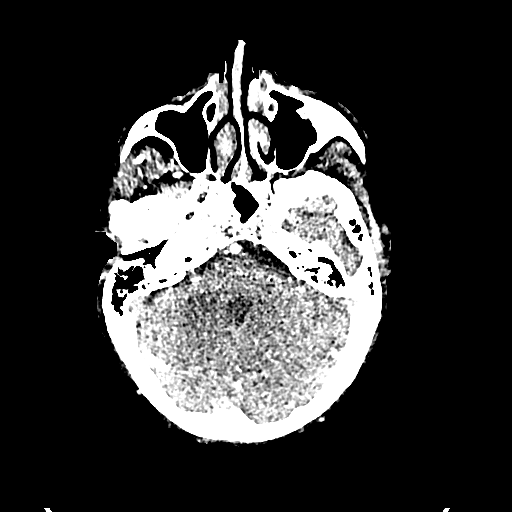
[im 428/642  bone]
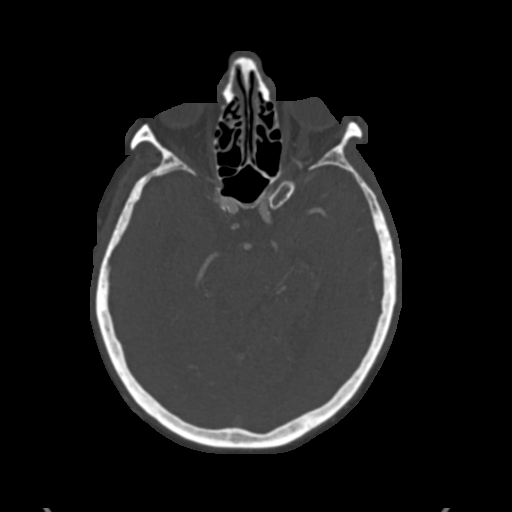
[im 471/642  brain]
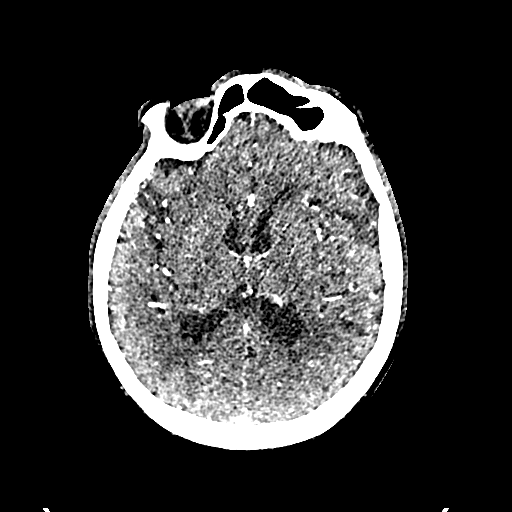
[im 513/642  bone]
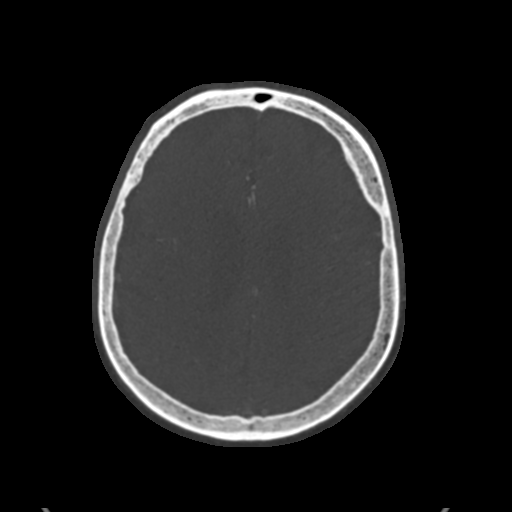
[im 556/642  brain]
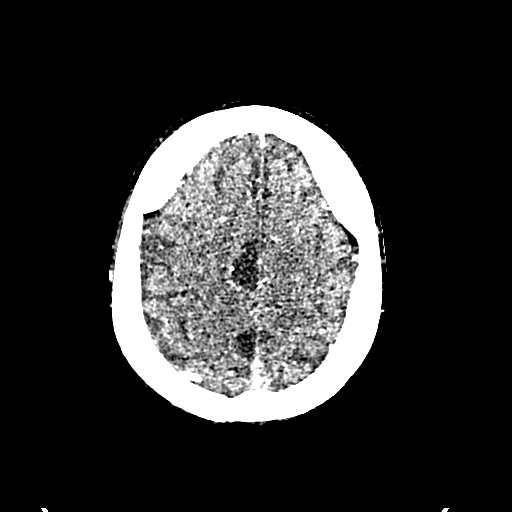
[im 599/642  bone]
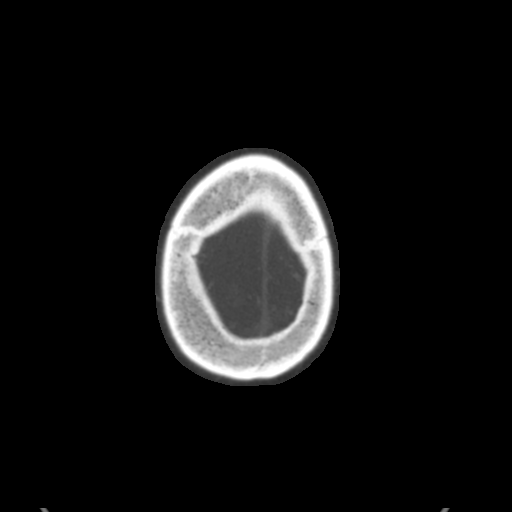

[14 of 47 positions shown; findings below may reference images not displayed]

FINDINGS: CTA NECK FINDINGS

Aortic arch: Atheromatous plaque.  Three vessel branching.

Right carotid system: Scattered mainly calcified atheromatous plaque
primarily at the bifurcation. No flow limiting stenosis, ulceration,
or beading.

Left carotid system: Calcified plaque primarily at the
bifurcation/bulb with at least 70% stenosis as measured on sagittal
reformats. No ulceration or beading.

Vertebral arteries: Proximal subclavian atherosclerosis without flow
limiting stenosis. The left vertebral artery is dominant. Vertebral
arteries are smooth and widely patent to the dura.

Skeleton: Advanced, generalized disc and facet degeneration with
C3-4 anterolisthesis. Partially covered left glenohumeral joint with
intra-articular bodies in the medial recess.

Other neck: No masslike or inflammatory finding seen.

Upper chest: No acute finding

Review of the MIP images confirms the above findings

CTA HEAD FINDINGS

Anterior circulation: Calcified plaque along the carotid siphons. No
branch occlusion, beading, or proximal flow limiting stenosis.
Negative for aneurysm.

Posterior circulation: Left dominant vertebral artery. Patent
bilateral PICA. Patent bilateral superior cerebellar arteries with
symmetric enhancement. A faint right AICA is present. Advanced right
P2 segment stenosis. High-grade left P3 segment stenosis. No beading
or aneurysm.

Venous sinuses: Unremarkable in the arterial phase

Anatomic variants: None significant

Review of the MIP images confirms the above findings
IMPRESSION: 1. No emergent finding.  No evident cerebellar branch occlusion.
2. At least 70% left ICA origin stenosis.
3. High-grade right P2 and left P3 atheromatous stenoses.

## 2022-01-02 ENCOUNTER — Ambulatory Visit (HOSPITAL_COMMUNITY)
Admission: RE | Admit: 2022-01-02 | Discharge: 2022-01-02 | Disposition: A | Discharge status: Home / Self Care | Payer: PPO | Source: Ambulatory Visit | Attending: Physician Assistant | Admitting: Physician Assistant

## 2022-01-02 DIAGNOSIS — I700 Atherosclerosis of aorta: Secondary | ICD-10-CM | POA: Diagnosis not present

## 2022-01-02 DIAGNOSIS — I350 Nonrheumatic aortic (valve) stenosis: Secondary | ICD-10-CM | POA: Insufficient documentation

## 2022-01-02 DIAGNOSIS — R911 Solitary pulmonary nodule: Secondary | ICD-10-CM | POA: Diagnosis not present

## 2022-01-02 DIAGNOSIS — J9811 Atelectasis: Secondary | ICD-10-CM | POA: Diagnosis not present

## 2022-01-02 MED ORDER — IOHEXOL 350 MG/ML SOLN
100.0000 mL | Freq: Once | INTRAVENOUS | Status: AC | PRN
  Administered 2022-01-02: 100 mL via INTRAVENOUS

## 2022-01-05 DIAGNOSIS — J0100 Acute maxillary sinusitis, unspecified: Secondary | ICD-10-CM | POA: Diagnosis not present

## 2022-01-05 DIAGNOSIS — I5022 Chronic systolic (congestive) heart failure: Secondary | ICD-10-CM | POA: Diagnosis not present

## 2022-01-05 DIAGNOSIS — I350 Nonrheumatic aortic (valve) stenosis: Secondary | ICD-10-CM | POA: Diagnosis not present

## 2022-01-21 NOTE — Progress Notes (Signed)
[address]       [place] [address]             [phone]                    Pearse D Blasco [place] Medical Record #[id number] Date of Birth: 1932/09/06  Referring: Werner Lean* Primary Care: Kathalene Frames, MD Primary Cardiologist: Werner Lean, MD  Chief Complaint:   No chief complaint on file.   History of Present Illness:    Ruben Barrera is a 88 y.o. male with a hx of HTN, HLD, Non-obstructive CAD without intervention, HFrEF,  LFLG-AS, right CVA stroke, frequent PVCs and SVT who presents for evaluation for aortic stenosis. In early 2022 had [place] 03/28/20 with diagnosis of CAD and AS.   He has had interval 12/22 admission and eval for infective endocarditis (saw Dr. Acie Fredrickson, Johney Frame, and Camas in interim). 2023: has TEE with healed vegetations.  EF has further declined.  Has had 3 months of SOB. Not able to do long walks any more.    It looks that the episode of endocarditis is over - he has not had any fevers,chills, night sweats.  Past Medical History:  Diagnosis Date   Arthritis    Coronary artery disease    Depression    Dysrhythmia    "extra heart beat"   GERD (gastroesophageal reflux disease)    History of hiatal hernia    Hypercholesteremia    Hypertension    Lumbar spinal stenosis    RLS (restless legs syndrome)    takes ativan as needed   Stroke ([place]) 01/2020   minor - no deficits    Past Surgical History:  Procedure Laterality Date   BACK SURGERY     COLONOSCOPY     EYE SURGERY Bilateral    cataracts removed    LUMBAR LAMINECTOMY/DECOMPRESSION MICRODISCECTOMY N/A 08/28/2015   Procedure: Lumbar Four-Five decompressive lumbar laminectomy;  Surgeon: Jovita Gamma, MD;  Location: [facility];  Service: Neurosurgery;  Laterality: N/A;   REPLACEMENT TOTAL KNEE BILATERAL Bilateral 11/05/2003   RIGHT/LEFT HEART CATH AND CORONARY ANGIOGRAPHY N/A 03/28/2020   Procedure: RIGHT/LEFT HEART CATH AND  CORONARY ANGIOGRAPHY;  Surgeon: Burnell Blanks, MD;  Location: [facility];  Service: Cardiovascular;  Laterality: N/A;   TEE WITHOUT CARDIOVERSION N/A 02/28/2021   Procedure: TRANSESOPHAGEAL ECHOCARDIOGRAM (TEE);  Surgeon: Freada Bergeron, MD;  Location: [facility] ENDOSCOPY;  Service: Cardiovascular;  Laterality: N/A;   TEE WITHOUT CARDIOVERSION N/A 05/28/2021   Procedure: TRANSESOPHAGEAL ECHOCARDIOGRAM (TEE);  Surgeon: Werner Lean, MD;  Location: [facility] ENDOSCOPY;  Service: Cardiovascular;  Laterality: N/A;   THORACIC DISCECTOMY N/A 07/10/2020   Procedure: Spinal cord stimulator via  - Thoracic Eight-Thoracic Nine, Thoracic Nine-Thoracic Ten Laminectomy;  Surgeon: Eustace Moore, MD;  Location: [facility];  Service: Neurosurgery;  Laterality: N/A;  3C   TOE AMPUTATION Right 2010   second toe   TONSILLECTOMY      Family History  Problem Relation Age of Onset   Stroke Mother    Hypertension Other        family history     Social History   Tobacco Use  Smoking Status Some Days   Types: Cigars  Smokeless Tobacco Never  Tobacco Comments   quit 1972- currently 1 cigar per week    Social History   Substance and Sexual Activity  Alcohol Use Yes   Comment: very occasionally  beer/wine or scotch     Allergies  Allergen Reactions   Simvastatin Other (See Comments)    Leg pains    Atorvastatin Other (See Comments)    Muscle aches   Benazepril Hcl Other (See Comments)    Pt does not know reaction    Losartan Other (See Comments)    Pt does not have reaction    Pravastatin Other (See Comments)    Leg pains    Rosuvastatin Other (See Comments)    Leg pains    Spironolactone Other (See Comments)    Pt does not reaction     Current Outpatient Medications  Medication Sig Dispense Refill   citalopram (CELEXA) 20 MG tablet Take 20 mg by mouth daily.     clopidogrel (PLAVIX) 75 MG tablet Take 1 tablet (75 mg total) by mouth daily. 30 tablet 5   ezetimibe (ZETIA)  10 MG tablet Take 1 tablet (10 mg total) by mouth every evening. 30 tablet 0   fluticasone (FLONASE) 50 MCG/ACT nasal spray Place 1 spray into both nostrils daily as needed for allergies.      furosemide (LASIX) 20 MG tablet Take 1 tablet (20 mg total) by mouth daily. Take 2 tablets daily in case of weight gain 2 to 3 lbs in 24 hrs or 5 lbs in 7 days. 30 tablet 0   gabapentin (NEURONTIN) 600 MG tablet Take 600-1,200 mg by mouth every morning.     HYDROcodone-acetaminophen (NORCO) 7.5-325 MG tablet Take 1 tablet by mouth every 8 (eight) hours as needed for moderate pain. 20 tablet 0   JARDIANCE 10 MG TABS tablet Take 10 mg by mouth daily.     LORazepam (ATIVAN) 1 MG tablet Take 1 mg by mouth at bedtime as needed for sleep.     metoprolol succinate (TOPROL-XL) 25 MG 24 hr tablet Take 0.5 tablets (12.5 mg total) by mouth daily. 15 tablet 0   MULTIPLE VITAMIN PO Take 1 tablet by mouth daily.      omeprazole (PRILOSEC) 20 MG capsule Take 20 mg by mouth daily.     pravastatin (PRAVACHOL) 20 MG tablet daily.     senna-docusate (SENOKOT-S) 8.6-50 MG tablet Take 1 tablet by mouth 2 (two) times daily.     tamsulosin (FLOMAX) 0.4 MG CAPS capsule Take 1 capsule (0.4 mg total) by mouth daily. 30 capsule 0   traZODone (DESYREL) 50 MG tablet Take 25 mg by mouth at bedtime as needed for sleep.     triamcinolone (KENALOG) 0.1 % Apply 1 application topically daily as needed (irritation).     No current facility-administered medications for this visit.    ROS 14 point ROS reviewed and negative except as per HPI   PHYSICAL EXAMINATION: There were no vitals taken for this visit.  Gen: NAD Neuro: Alert and oriented Resp: Nonlaboured Abd: Soft, ntnd Extr: WWP  Diagnostic Studies & Laboratory data:     Recent Radiology Findings:   CT CORONARY MORPH W/CTA COR W/SCORE W/CA W/CM &/OR WO/CM  Addendum Date: 01/02/2022   ADDENDUM REPORT: 01/02/2022 14:56 CLINICAL DATA:  Aortic Valve pathology with assessment  for TAVR EXAM: Cardiac TAVR CT TECHNIQUE: The patient was scanned on a Siemens Force 161 slice scanner. A 120 kV retrospective scan was triggered in the descending thoracic aorta at 111 HU's. Gantry rotation speed was 270 msecs and collimation was .9 mm. No beta blockade or nitro were given. The 3D data set was reconstructed in 5% intervals of the R-R cycle. Systolic  and diastolic phases were analyzed on a dedicated work station using MPR, MIP and VRT modes. The patient received 100 cc of contrast. FINDINGS: Aortic Valve: Severely thickened aortic valve with heavy calcification and reduced excursion the planimeter valve area is 1.07 Sq cm consistent with moderate to severe aortic stenosis Number of leaflets: 3 LVOT calcification: None Annular calcification: Mild Aortic Valve Calcium Score: 2025 Presence of severe basal septal hypertrophy: no Perimembranous septal diameter: 9.6  Mm Mitral Valve: Moderate scattered mitral annular calcification Aortic Annulus Measurements- 30% Major annulus diameter: 30 mm Minor annulus diameter:25 mm Annular perimeter: 84 mm Annular area: 5.48 cm2 Aortic Root Measurements Sinotubular Junction: 28 mm Ascending Thoracic Aorta: 30 mm Aortic Arch: 29 mm Aortic atherosclerosis. Sinus of Valsalva Measurements: Right coronary cusp width: 31 mm Left coronary cusp width: 32 mm Non coronary cusp width: 32 mm Coronary Artery Height above Annulus: Left Main: 15 mm Left SoV height: 23 mm Right Coronary: 17 mm Right SoV height: 22 mm Optimum Fluoroscopic Angle for Delivery: RAO 20 CAU 14 Cusp overlap view angle: RAO 0 CRA 17 Valves for structural team consideration: 29 mm Sapien Valve (Measurements on the lower end for a 29 mm Valve, LVOT is tubular and does not narrow) Sufficient sinus measurements for a 34 mm Evolute Valve Non TAVR Valve Findings: Coronary Arteries: Normal coronary origin. Study not completed with nitroglycerin. Coronary Calcium Score: Left main: 0 Left anterior descending  artery: 578 Left circumflex artery: 552 Right coronary artery: 545 Total: 1675 Systemic veins: Normal anatomy Main Pulmonary artery: Severely dilated 35 mm Pulmonary veins: Normal anatomy Left atrial appendage: Patent Interatrial septum: No communications Left ventricle: Normal size Left atrium: Dilated Right ventricle: Normal size Right atrium: Normal size Pericardium: No calcifications Extra Cardiac Findings as per separate reporting. Notable artifacts: Slab artifact from patient motion IMPRESSION: 1. Severe Aortic stenosis. Findings pertinent to TAVR procedure are detailed above. RECOMMENDATIONS: The proposed cut-off value of 1,651 AU yielded a 93 % sensitivity and 75 % specificity in grading AS severity in patients with classical low-flow, low-gradient AS. Proposed different cut-off values to define severe AS for men and women as 2,065 AU and 1,274 AU, respectively. The joint European and American recommendations for the assessment of AS consider the aortic valve calcium score as a continuum - a very high calcium score suggests severe AS and a low calcium score suggests severe AS is unlikely. Kerman Passey, et al. 2017 ESC/EACTS Guidelines for the management of valvular heart disease. Eur Heart J [phone] Coronary artery calcium (CAC) score is a strong predictor of incident coronary heart disease (CHD) and provides predictive information beyond traditional risk factors. CAC scoring is reasonable to use in the decision to withhold, postpone, or initiate statin therapy in intermediate-risk or selected borderline-risk asymptomatic adults (age 27-75 years and LDL-C >=70 to <190 mg/dL) who do not have diabetes or established atherosclerotic cardiovascular disease (ASCVD).* In intermediate-risk (10-year ASCVD risk >=7.5% to <20%) adults or selected borderline-risk (10-year ASCVD risk >=5% to <7.5%) adults in whom a CAC score is measured for the purpose of making a treatment decision the  following recommendations have been made: If CAC = 0, it is reasonable to withhold statin therapy and reassess in 5 to 10 years, as long as higher risk conditions are absent (diabetes mellitus, family history of premature CHD in first degree relatives (males <55 years; females <65 years), cigarette smoking, LDL >=190 mg/dL or other independent risk factors). If CAC is 1 to 99,  it is reasonable to initiate statin therapy for patients >=72 years of age. If CAC is >=100 or >=75th percentile, it is reasonable to initiate statin therapy at any age. Cardiology referral should be considered for patients with CAC scores >=400 or >=75th percentile. *2018 AHA/ACC/AACVPR/AAPA/ABC/ACPM/ADA/AGS/APhA/ASPC/NLA/PCNA Guideline on the Management of Blood Cholesterol: A Report of the American College of Cardiology/American Heart Association Task Force on Clinical Practice Guidelines. J Am Coll Cardiol. 2019;73(24):3168-3209. Mahesh  Chandrasekhar Electronically Signed   By: Rudean Haskell M.D.   On: 01/02/2022 14:56   Result Date: 01/02/2022 EXAM: OVER-READ INTERPRETATION  CT CHEST The following report is a limited chest CT over-read performed by radiologist Dr. Yetta Glassman of [facility], [place] on 01/02/2022. This over-read does not include interpretation of cardiac or coronary anatomy or pathology. The cardiac TAVR interpretation by the cardiologist is attached. COMPARISON:  None Available. FINDINGS: Extracardiac findings will be described separately under dictation for contemporaneously obtained CTA chest, abdomen and pelvis. IMPRESSION: Please see separate dictation for contemporaneously obtained CTA chest, abdomen and pelvis dated 01/02/2022 for full description of relevant extracardiac findings. Electronically Signed: By: Yetta Glassman M.D. On: 01/02/2022 12:42   CT ANGIO ABDOMEN PELVIS  W &/OR WO CONTRAST  Result Date: 01/02/2022 CLINICAL DATA:  Preop evaluation for aortic valve replacement EXAM: CT  ANGIOGRAPHY CHEST, ABDOMEN AND PELVIS TECHNIQUE: Non-contrast CT of the chest was initially obtained. Multidetector CT imaging through the chest, abdomen and pelvis was performed using the standard protocol during bolus administration of intravenous contrast. Multiplanar reconstructed images and MIPs were obtained and reviewed to evaluate the vascular anatomy. RADIATION DOSE REDUCTION: This exam was performed according to the departmental dose-optimization program which includes automated exposure control, adjustment of the mA and/or kV according to patient size and/or use of iterative reconstruction technique. CONTRAST:  161m OMNIPAQUE IOHEXOL 350 MG/ML SOLN COMPARISON:  None Available. FINDINGS: CTA CHEST FINDINGS Cardiovascular: Cardiomegaly. No pericardial effusion. Normal caliber thoracic aorta with moderate atherosclerotic disease. Left main and three-vessel coronary artery calcifications. Aortic valve thickening and calcifications. No suspicious filling defects of the central pulmonary arteries. Mediastinum/Nodes: Mild diffuse circumferential esophageal wall thickening. Enlarged right hilar lymph node measuring 1 5 cm in short axis on series 4, image 62 enlarged subcarinal lymph node measuring 1.1 cm in short axis on image 63. Lungs/Pleura: Central airways are patent. No consolidation, pleural effusion or pneumothorax. Bibasilar atelectasis. Solid subpleural pulmonary nodule of the right middle lobe measuring 4 mm on series 5, image 72. Musculoskeletal: No chest wall abnormality. No acute or significant osseous findings. CTA ABDOMEN AND PELVIS FINDINGS Hepatobiliary: No focal liver abnormality is seen. No gallstones, gallbladder wall thickening, or biliary dilatation. Pancreas: Unremarkable. No pancreatic ductal dilatation or surrounding inflammatory changes. Spleen: Normal in size without focal abnormality. Adrenals/Urinary Tract: Bilateral adrenal glands are unremarkable. No hydronephrosis or  nephrolithiasis. Exophytic low-attenuation right renal lesion, compatible with a simple cysts, no further follow-up imaging is recommended. Bladder is unremarkable. Stomach/Bowel: Stomach is within normal limits. Appendix appears normal. Severe sigmoid diverticulosis. No evidence of bowel wall thickening, distention, or inflammatory changes. Vascular/lymphatic: No pathologically enlarged lymph nodes seen in the abdomen or pelvis. Normal caliber thoracic aorta with severe calcified and noncalcified plaque. Reproductive: Prostatomegaly. Other: Small fat containing left inguinal hernia. No abdominopelvic ascites. Musculoskeletal: No acute osseous findings. Posterior fusion of L4-L5. Severe degenerative changes of the lumbar spine. Partially visualized spinal stimulator device lead at the level of the lower thoracic spine. VASCULAR MEASUREMENTS PERTINENT TO TAVR: AORTA: Minimal Aortic Diameter-17.2  mm Severity of Aortic Calcification-severe RIGHT PELVIS: Right Common Iliac Artery - Minimal Diameter-8.3 mm Tortuosity-severe Calcification-moderate Right External Iliac Artery - Minimal Diameter-7.9 mm Tortuosity-moderate Calcification-none Right Common Femoral Artery - Minimal Diameter-7.8 mm Tortuosity-none Calcification-none LEFT PELVIS: Left Common Iliac Artery - Minimal Diameter-10.2 mm Tortuosity-moderate Calcification-mild Left External Iliac Artery - Minimal Diameter-7.3 mm Tortuosity-moderate Calcification-none Left Common Femoral Artery - Minimal Diameter-6.9 mm Tortuosity-none Calcification-mild Review of the MIP images confirms the above findings. IMPRESSION: Vascular: 1. Vascular findings and measurements pertinent to potential TAVR procedure, as detailed above. 2. Thickening and calcification of the aortic valve, compatible with reported clinical history of aortic stenosis. 3. Severe aortic and moderate iliac atherosclerosis. Left main and 3 vessel coronary artery disease. Nonvascular: 1. Mild diffuse  circumferential esophageal wall thickening, findings can be seen in the setting of esophagitis. 2. Enlarged right hilar and subcarinal lymph nodes, likely reactive. Recommend follow-up chest CT in 3 months to ensure resolution. 3. Solid subpleural pulmonary nodule of the right middle lobe measuring 4 mm. No follow-up needed if patient is low-risk.This recommendation follows the consensus statement: Guidelines for Management of Incidental Pulmonary Nodules Detected on CT Images: From the Fleischner Society 2017; Radiology 2017; 284:228-243. Electronically Signed   By: Yetta Glassman M.D.   On: 01/02/2022 12:41   CT ANGIO CHEST AORTA W/CM & OR WO/CM  Result Date: 01/02/2022 CLINICAL DATA:  Preop evaluation for aortic valve replacement EXAM: CT ANGIOGRAPHY CHEST, ABDOMEN AND PELVIS TECHNIQUE: Non-contrast CT of the chest was initially obtained. Multidetector CT imaging through the chest, abdomen and pelvis was performed using the standard protocol during bolus administration of intravenous contrast. Multiplanar reconstructed images and MIPs were obtained and reviewed to evaluate the vascular anatomy. RADIATION DOSE REDUCTION: This exam was performed according to the departmental dose-optimization program which includes automated exposure control, adjustment of the mA and/or kV according to patient size and/or use of iterative reconstruction technique. CONTRAST:  192m OMNIPAQUE IOHEXOL 350 MG/ML SOLN COMPARISON:  None Available. FINDINGS: CTA CHEST FINDINGS Cardiovascular: Cardiomegaly. No pericardial effusion. Normal caliber thoracic aorta with moderate atherosclerotic disease. Left main and three-vessel coronary artery calcifications. Aortic valve thickening and calcifications. No suspicious filling defects of the central pulmonary arteries. Mediastinum/Nodes: Mild diffuse circumferential esophageal wall thickening. Enlarged right hilar lymph node measuring 1 5 cm in short axis on series 4, image 62 enlarged  subcarinal lymph node measuring 1.1 cm in short axis on image 63. Lungs/Pleura: Central airways are patent. No consolidation, pleural effusion or pneumothorax. Bibasilar atelectasis. Solid subpleural pulmonary nodule of the right middle lobe measuring 4 mm on series 5, image 72. Musculoskeletal: No chest wall abnormality. No acute or significant osseous findings. CTA ABDOMEN AND PELVIS FINDINGS Hepatobiliary: No focal liver abnormality is seen. No gallstones, gallbladder wall thickening, or biliary dilatation. Pancreas: Unremarkable. No pancreatic ductal dilatation or surrounding inflammatory changes. Spleen: Normal in size without focal abnormality. Adrenals/Urinary Tract: Bilateral adrenal glands are unremarkable. No hydronephrosis or nephrolithiasis. Exophytic low-attenuation right renal lesion, compatible with a simple cysts, no further follow-up imaging is recommended. Bladder is unremarkable. Stomach/Bowel: Stomach is within normal limits. Appendix appears normal. Severe sigmoid diverticulosis. No evidence of bowel wall thickening, distention, or inflammatory changes. Vascular/lymphatic: No pathologically enlarged lymph nodes seen in the abdomen or pelvis. Normal caliber thoracic aorta with severe calcified and noncalcified plaque. Reproductive: Prostatomegaly. Other: Small fat containing left inguinal hernia. No abdominopelvic ascites. Musculoskeletal: No acute osseous findings. Posterior fusion of L4-L5. Severe degenerative changes of the lumbar spine. Partially visualized spinal stimulator  device lead at the level of the lower thoracic spine. VASCULAR MEASUREMENTS PERTINENT TO TAVR: AORTA: Minimal Aortic Diameter-17.2 mm Severity of Aortic Calcification-severe RIGHT PELVIS: Right Common Iliac Artery - Minimal Diameter-8.3 mm Tortuosity-severe Calcification-moderate Right External Iliac Artery - Minimal Diameter-7.9 mm Tortuosity-moderate Calcification-none Right Common Femoral Artery - Minimal Diameter-7.8  mm Tortuosity-none Calcification-none LEFT PELVIS: Left Common Iliac Artery - Minimal Diameter-10.2 mm Tortuosity-moderate Calcification-mild Left External Iliac Artery - Minimal Diameter-7.3 mm Tortuosity-moderate Calcification-none Left Common Femoral Artery - Minimal Diameter-6.9 mm Tortuosity-none Calcification-mild Review of the MIP images confirms the above findings. IMPRESSION: Vascular: 1. Vascular findings and measurements pertinent to potential TAVR procedure, as detailed above. 2. Thickening and calcification of the aortic valve, compatible with reported clinical history of aortic stenosis. 3. Severe aortic and moderate iliac atherosclerosis. Left main and 3 vessel coronary artery disease. Nonvascular: 1. Mild diffuse circumferential esophageal wall thickening, findings can be seen in the setting of esophagitis. 2. Enlarged right hilar and subcarinal lymph nodes, likely reactive. Recommend follow-up chest CT in 3 months to ensure resolution. 3. Solid subpleural pulmonary nodule of the right middle lobe measuring 4 mm. No follow-up needed if patient is low-risk.This recommendation follows the consensus statement: Guidelines for Management of Incidental Pulmonary Nodules Detected on CT Images: From the Fleischner Society 2017; Radiology 2017; 284:228-243. Electronically Signed   By: Yetta Glassman M.D.   On: 01/02/2022 12:41       I have independently reviewed the above radiology studies  and reviewed the findings with the patient.   Recent Lab Findings: Lab Results  Component Value Date   WBC 8.4 10/18/2021   HGB 16.3 10/18/2021   HCT 46.3 10/18/2021   PLT 281 10/18/2021   GLUCOSE 126 (H) 10/18/2021   CHOL 204 (H) 02/21/2020   TRIG 199 (H) 02/21/2020   HDL 42 02/21/2020   LDLCALC 122 (H) 02/21/2020   ALT 36 10/18/2021   AST 37 10/18/2021   NA 142 10/18/2021   K 3.8 10/18/2021   CL 101 10/18/2021   CREATININE 1.41 (H) 10/18/2021   BUN 24 (H) 10/18/2021   CO2 30 10/18/2021   TSH  0.757 02/23/2020   INR 1.2 02/24/2021   HGBA1C 5.6 02/21/2020     Assessment / Plan:   Ruben Barrera is a 88 y.o. male with a hx of HTN, HLD, Non-obstructive CAD without intervention, HFrEF,  LFLG-AS, right CVA stroke, frequent PVCs and SVT who presents for evaluation for symptomatic aortic stenosis.  Meets criteria for AV replacement STS risk of 5.5%.  Risks/benefits/alternatives discussed at length along with expected hospital course.   Valve: 26 Edwards S3 Approach: Transfemoral. Right is more tortious. Consider left sided access as 1st choice.  Bailout: yes NYHA: II  Misc procedural: Has a history of mitral endocarditis     I  spent 30 minutes with  the patient face to face in counseling and coordination of care.    Pierre Bali Georgia Delsignore 01/21/2022 4:51 PM

## 2022-01-22 ENCOUNTER — Encounter: Payer: Self-pay | Admitting: Cardiothoracic Surgery

## 2022-01-22 ENCOUNTER — Institutional Professional Consult (permissible substitution) (INDEPENDENT_AMBULATORY_CARE_PROVIDER_SITE_OTHER): Payer: PPO | Admitting: Cardiothoracic Surgery

## 2022-01-22 VITALS — BP 110/70 | HR 41 | Resp 20 | Ht 70.0 in | Wt 172.4 lb

## 2022-01-22 DIAGNOSIS — I350 Nonrheumatic aortic (valve) stenosis: Secondary | ICD-10-CM

## 2022-01-26 ENCOUNTER — Encounter: Payer: Self-pay | Admitting: Podiatry

## 2022-01-26 ENCOUNTER — Ambulatory Visit: Payer: PPO | Admitting: Podiatry

## 2022-01-26 DIAGNOSIS — B351 Tinea unguium: Secondary | ICD-10-CM | POA: Diagnosis not present

## 2022-01-26 DIAGNOSIS — M79674 Pain in right toe(s): Secondary | ICD-10-CM

## 2022-01-26 DIAGNOSIS — M79675 Pain in left toe(s): Secondary | ICD-10-CM

## 2022-01-26 DIAGNOSIS — D689 Coagulation defect, unspecified: Secondary | ICD-10-CM | POA: Insufficient documentation

## 2022-01-26 DIAGNOSIS — N1831 Chronic kidney disease, stage 3a: Secondary | ICD-10-CM

## 2022-01-26 DIAGNOSIS — G629 Polyneuropathy, unspecified: Secondary | ICD-10-CM

## 2022-01-26 NOTE — Progress Notes (Signed)
This patient returns to my office for at risk foot care.  This patient requires this care by a professional since this patient will be at risk due to having CKD, diabetes and amputation second toe right foot. This patient is unable to cut nails himself since the patient cannot reach his nails.These nails are painful walking and wearing shoes.  This patient presents for at risk foot care today.  General Appearance  Alert, conversant and in no acute stress.  Vascular  Dorsalis pedis and posterior tibial  pulses are palpable  bilaterally.  Capillary return is within normal limits  bilaterally. Temperature is within normal limits  bilaterally.  Neurologic  Senn-Weinstein monofilament wire test within normal limits  bilaterally. Muscle power within normal limits bilaterally.  Nails Thick disfigured discolored nails with subungual debris  from hallux to fifth toes bilaterally. No evidence of bacterial infection or drainage bilaterally.  Orthopedic  No limitations of motion  feet .  No crepitus or effusions noted.  No bony pathology or digital deformities noted. Severe  HAV  B/L.  Amputation second toe right foot.  Skin  normotropic skin with no porokeratosis noted bilaterally.  No signs of infections or ulcers noted.     Onychomycosis  Pain in right toes  Pain in left toes  Consent was obtained for treatment procedures.   Mechanical debridement of nails 1-5  bilaterally performed with a nail nipper.  Filed with dremel without incident.    Return office visit   3 months                   Told patient to return for periodic foot care and evaluation due to potential at risk complications.   Gardiner Barefoot DPM

## 2022-01-29 ENCOUNTER — Other Ambulatory Visit: Payer: Self-pay

## 2022-01-29 DIAGNOSIS — I350 Nonrheumatic aortic (valve) stenosis: Secondary | ICD-10-CM

## 2022-02-04 ENCOUNTER — Encounter: Payer: Self-pay | Admitting: Podiatry

## 2022-02-04 ENCOUNTER — Ambulatory Visit: Payer: PPO | Admitting: Podiatry

## 2022-02-04 DIAGNOSIS — M7751 Other enthesopathy of right foot: Secondary | ICD-10-CM | POA: Diagnosis not present

## 2022-02-04 MED ORDER — TRIAMCINOLONE ACETONIDE 10 MG/ML IJ SUSP: 10.0000 mg | Freq: Once | INTRAMUSCULAR | Status: AC

## 2022-02-04 MED ORDER — TRIAMCINOLONE ACETONIDE 10 MG/ML IJ SUSP
10.0000 mg | Freq: Once | INTRAMUSCULAR | Status: AC
  Administered 2022-02-04: 10 mg

## 2022-02-05 DIAGNOSIS — Z6826 Body mass index (BMI) 26.0-26.9, adult: Secondary | ICD-10-CM | POA: Diagnosis not present

## 2022-02-05 DIAGNOSIS — R7303 Prediabetes: Secondary | ICD-10-CM | POA: Diagnosis not present

## 2022-02-05 DIAGNOSIS — I350 Nonrheumatic aortic (valve) stenosis: Secondary | ICD-10-CM | POA: Diagnosis not present

## 2022-02-05 DIAGNOSIS — F1120 Opioid dependence, uncomplicated: Secondary | ICD-10-CM | POA: Diagnosis not present

## 2022-02-05 DIAGNOSIS — Z9689 Presence of other specified functional implants: Secondary | ICD-10-CM | POA: Diagnosis not present

## 2022-02-05 DIAGNOSIS — I10 Essential (primary) hypertension: Secondary | ICD-10-CM | POA: Diagnosis not present

## 2022-02-05 DIAGNOSIS — N1831 Chronic kidney disease, stage 3a: Secondary | ICD-10-CM | POA: Diagnosis not present

## 2022-02-05 DIAGNOSIS — G894 Chronic pain syndrome: Secondary | ICD-10-CM | POA: Diagnosis not present

## 2022-02-05 DIAGNOSIS — I5020 Unspecified systolic (congestive) heart failure: Secondary | ICD-10-CM | POA: Diagnosis not present

## 2022-02-05 DIAGNOSIS — I2510 Atherosclerotic heart disease of native coronary artery without angina pectoris: Secondary | ICD-10-CM | POA: Diagnosis not present

## 2022-02-05 DIAGNOSIS — R42 Dizziness and giddiness: Secondary | ICD-10-CM | POA: Diagnosis not present

## 2022-02-05 NOTE — Progress Notes (Signed)
**Note Ruben-Identified via Obfuscation** Subjective:   Patient ID: Ruben Barrera, male   DOB: 88 y.o.   MRN: [id number]   HPI Patient states he has a lot of pain in his right forefoot and states that the ankle that we had worked on previously is doing well   ROS      Objective:  Physical Exam  Neuro vascular status intact with pain inflammation of the right forefoot around the fourth MPJ with moderate fluid buildup around the joint with ankle that is done much better after previous treatment     Assessment:  Inflammatory capsulitis fourth MPJ right with fluid buildup around the joint surface     Plan:  H&P reviewed condition went ahead today did sterile prep and did a periarticular injection around the fourth MPJ 3 mg dexamethasone Kenalog 5 mg Xylocaine advised on rigid bottom shoes reappoint as symptoms indicate we can do this periodically as needed  X-rays indicate no signs of stress fracture

## 2022-02-06 ENCOUNTER — Telehealth: Payer: Self-pay | Admitting: Cardiovascular Disease

## 2022-02-06 ENCOUNTER — Encounter (HOSPITAL_COMMUNITY)
Admission: RE | Admit: 2022-02-06 | Discharge: 2022-02-06 | Disposition: A | Discharge status: Home / Self Care | Payer: PPO | Source: Ambulatory Visit | Attending: Cardiovascular Disease | Admitting: Cardiovascular Disease

## 2022-02-06 ENCOUNTER — Ambulatory Visit (HOSPITAL_COMMUNITY)
Admission: RE | Admit: 2022-02-06 | Discharge: 2022-02-06 | Disposition: A | Discharge status: Home / Self Care | Payer: PPO | Source: Ambulatory Visit | Attending: Cardiovascular Disease | Admitting: Cardiovascular Disease

## 2022-02-06 DIAGNOSIS — I350 Nonrheumatic aortic (valve) stenosis: Secondary | ICD-10-CM | POA: Diagnosis not present

## 2022-02-06 DIAGNOSIS — Z01818 Encounter for other preprocedural examination: Secondary | ICD-10-CM | POA: Diagnosis not present

## 2022-02-06 DIAGNOSIS — Z1152 Encounter for screening for COVID-19: Secondary | ICD-10-CM | POA: Diagnosis not present

## 2022-02-06 LAB — COMPREHENSIVE METABOLIC PANEL
ALT: 15 U/L (ref 0–44)
AST: 28 U/L (ref 15–41)
Albumin: 4.2 g/dL (ref 3.5–5.0)
Alkaline Phosphatase: 63 U/L (ref 38–126)
Anion gap: 9 (ref 5–15)
BUN: 18 mg/dL (ref 8–23)
CO2: 25 mmol/L (ref 22–32)
Calcium: 8.8 mg/dL — ABNORMAL LOW (ref 8.9–10.3)
Chloride: 103 mmol/L (ref 98–111)
Creatinine, Ser: 1.26 mg/dL — ABNORMAL HIGH (ref 0.61–1.24)
GFR, Estimated: 55 mL/min — ABNORMAL LOW (ref >60)
Glucose, Bld: 106 mg/dL — ABNORMAL HIGH (ref 70–99)
Potassium: 4.4 mmol/L (ref 3.5–5.1)
Sodium: 137 mmol/L (ref 135–145)
Total Bilirubin: 0.7 mg/dL (ref 0.3–1.2)
Total Protein: 7.2 g/dL (ref 6.5–8.1)

## 2022-02-06 LAB — URINALYSIS, ROUTINE W REFLEX MICROSCOPIC
Bilirubin Urine: NEGATIVE
Glucose, UA: 150 mg/dL — AB
Hgb urine dipstick: NEGATIVE
Ketones, ur: NEGATIVE mg/dL
Leukocytes,Ua: NEGATIVE
Nitrite: NEGATIVE
Protein, ur: NEGATIVE mg/dL
Specific Gravity, Urine: 1.011 (ref 1.005–1.030)
pH: 5 (ref 5.0–8.0)

## 2022-02-06 LAB — CBC
HCT: 49.8 % (ref 39.0–52.0)
Hemoglobin: 16.1 g/dL (ref 13.0–17.0)
MCH: 31.9 pg (ref 26.0–34.0)
MCHC: 32.3 g/dL (ref 30.0–36.0)
MCV: 98.6 fL (ref 80.0–100.0)
Platelets: 218 10*3/uL (ref 150–400)
RBC: 5.05 MIL/uL (ref 4.22–5.81)
RDW: 14.1 % (ref 11.5–15.5)
WBC: 8.8 10*3/uL (ref 4.0–10.5)
nRBC: 0 % (ref 0.0–0.2)

## 2022-02-06 LAB — SURGICAL PCR SCREEN
MRSA, PCR: NEGATIVE
Staphylococcus aureus: NEGATIVE

## 2022-02-06 LAB — SARS CORONAVIRUS 2 (TAT 6-24 HRS): SARS Coronavirus 2: NEGATIVE

## 2022-02-06 LAB — PROTIME-INR
INR: 1.1 (ref 0.8–1.2)
Prothrombin Time: 13.9 seconds (ref 11.4–15.2)

## 2022-02-06 NOTE — Progress Notes (Signed)
Pt given letter from office with instructions as well as CHG instructions. Soap given.

## 2022-02-06 NOTE — Telephone Encounter (Signed)
Melissa from [facility] is calling to inquire on if this patient will need extra blood for his upcoming procedure. Please advise.

## 2022-02-06 NOTE — Telephone Encounter (Signed)
I spoke with Ruben Barrera and made her aware that the pt will need 2 units for TAVR.

## 2022-02-09 MED ORDER — POTASSIUM CHLORIDE 2 MEQ/ML IV SOLN
80.0000 meq | INTRAVENOUS | Status: DC
  Filled 2022-02-09 (×2): qty 40

## 2022-02-09 MED ORDER — CEFAZOLIN SODIUM-DEXTROSE 2-4 GM/100ML-% IV SOLN
2.0000 g | INTRAVENOUS | Status: AC
  Administered 2022-02-10: 2 g via INTRAVENOUS
  Filled 2022-02-09: qty 100

## 2022-02-09 MED ORDER — DEXMEDETOMIDINE HCL IN NACL 400 MCG/100ML IV SOLN
0.1000 ug/kg/h | INTRAVENOUS | Status: AC
  Administered 2022-02-10: 78.2 ug via INTRAVENOUS
  Filled 2022-02-09: qty 100

## 2022-02-09 MED ORDER — MAGNESIUM SULFATE 50 % IJ SOLN
40.0000 meq | INTRAMUSCULAR | Status: DC
  Filled 2022-02-09 (×2): qty 9.85

## 2022-02-09 MED ORDER — NOREPINEPHRINE 4 MG/250ML-% IV SOLN
0.0000 ug/min | INTRAVENOUS | Status: AC
  Administered 2022-02-10: 1 ug/min via INTRAVENOUS
  Filled 2022-02-09: qty 250

## 2022-02-09 MED ORDER — HEPARIN 30,000 UNITS/1000 ML (OHS) CELLSAVER SOLUTION
Status: DC
  Filled 2022-02-09 (×2): qty 1000

## 2022-02-10 ENCOUNTER — Inpatient Hospital Stay (HOSPITAL_COMMUNITY): Payer: PPO | Admitting: Anesthesiology

## 2022-02-10 ENCOUNTER — Inpatient Hospital Stay (HOSPITAL_COMMUNITY): Payer: PPO | Admitting: Physician Assistant

## 2022-02-10 ENCOUNTER — Other Ambulatory Visit: Payer: Self-pay | Admitting: Cardiology

## 2022-02-10 ENCOUNTER — Inpatient Hospital Stay (HOSPITAL_COMMUNITY): Payer: PPO

## 2022-02-10 ENCOUNTER — Encounter (HOSPITAL_COMMUNITY): Payer: Self-pay | Admitting: Cardiovascular Disease

## 2022-02-10 ENCOUNTER — Other Ambulatory Visit: Payer: Self-pay

## 2022-02-10 ENCOUNTER — Inpatient Hospital Stay (HOSPITAL_COMMUNITY)
Admission: RE | Admit: 2022-02-10 | Discharge: 2022-02-11 | DRG: 267 | Disposition: A | Discharge status: Home / Self Care | Payer: PPO | Source: Ambulatory Visit | Attending: Cardiovascular Disease | Admitting: Cardiovascular Disease

## 2022-02-10 ENCOUNTER — Encounter (HOSPITAL_COMMUNITY)
Admission: RE | Disposition: A | Discharge status: Home / Self Care | Payer: Self-pay | Source: Ambulatory Visit | Attending: Cardiovascular Disease

## 2022-02-10 DIAGNOSIS — M1990 Unspecified osteoarthritis, unspecified site: Secondary | ICD-10-CM | POA: Diagnosis present

## 2022-02-10 DIAGNOSIS — I2510 Atherosclerotic heart disease of native coronary artery without angina pectoris: Secondary | ICD-10-CM

## 2022-02-10 DIAGNOSIS — I493 Ventricular premature depolarization: Secondary | ICD-10-CM

## 2022-02-10 DIAGNOSIS — I350 Nonrheumatic aortic (valve) stenosis: Principal | ICD-10-CM | POA: Diagnosis present

## 2022-02-10 DIAGNOSIS — I130 Hypertensive heart and chronic kidney disease with heart failure and stage 1 through stage 4 chronic kidney disease, or unspecified chronic kidney disease: Secondary | ICD-10-CM | POA: Diagnosis not present

## 2022-02-10 DIAGNOSIS — R0689 Other abnormalities of breathing: Secondary | ICD-10-CM | POA: Diagnosis not present

## 2022-02-10 DIAGNOSIS — F17290 Nicotine dependence, other tobacco product, uncomplicated: Secondary | ICD-10-CM | POA: Diagnosis present

## 2022-02-10 DIAGNOSIS — F32A Depression, unspecified: Secondary | ICD-10-CM | POA: Diagnosis not present

## 2022-02-10 DIAGNOSIS — Z8673 Personal history of transient ischemic attack (TIA), and cerebral infarction without residual deficits: Secondary | ICD-10-CM | POA: Diagnosis not present

## 2022-02-10 DIAGNOSIS — I6381 Other cerebral infarction due to occlusion or stenosis of small artery: Secondary | ICD-10-CM | POA: Diagnosis not present

## 2022-02-10 DIAGNOSIS — Z79899 Other long term (current) drug therapy: Secondary | ICD-10-CM

## 2022-02-10 DIAGNOSIS — K219 Gastro-esophageal reflux disease without esophagitis: Secondary | ICD-10-CM | POA: Diagnosis present

## 2022-02-10 DIAGNOSIS — Z720 Tobacco use: Secondary | ICD-10-CM | POA: Diagnosis present

## 2022-02-10 DIAGNOSIS — I10 Essential (primary) hypertension: Secondary | ICD-10-CM | POA: Diagnosis not present

## 2022-02-10 DIAGNOSIS — F17200 Nicotine dependence, unspecified, uncomplicated: Secondary | ICD-10-CM | POA: Diagnosis not present

## 2022-02-10 DIAGNOSIS — Z8249 Family history of ischemic heart disease and other diseases of the circulatory system: Secondary | ICD-10-CM

## 2022-02-10 DIAGNOSIS — I330 Acute and subacute infective endocarditis: Secondary | ICD-10-CM | POA: Diagnosis present

## 2022-02-10 DIAGNOSIS — Z952 Presence of prosthetic heart valve: Secondary | ICD-10-CM

## 2022-02-10 DIAGNOSIS — I4439 Other atrioventricular block: Secondary | ICD-10-CM | POA: Diagnosis not present

## 2022-02-10 DIAGNOSIS — N1831 Chronic kidney disease, stage 3a: Secondary | ICD-10-CM | POA: Diagnosis not present

## 2022-02-10 DIAGNOSIS — I110 Hypertensive heart disease with heart failure: Secondary | ICD-10-CM | POA: Diagnosis not present

## 2022-02-10 DIAGNOSIS — Z7902 Long term (current) use of antithrombotics/antiplatelets: Secondary | ICD-10-CM | POA: Diagnosis not present

## 2022-02-10 DIAGNOSIS — Z7984 Long term (current) use of oral hypoglycemic drugs: Secondary | ICD-10-CM

## 2022-02-10 DIAGNOSIS — G459 Transient cerebral ischemic attack, unspecified: Secondary | ICD-10-CM | POA: Diagnosis not present

## 2022-02-10 DIAGNOSIS — Z96653 Presence of artificial knee joint, bilateral: Secondary | ICD-10-CM | POA: Diagnosis not present

## 2022-02-10 DIAGNOSIS — I471 Supraventricular tachycardia, unspecified: Secondary | ICD-10-CM | POA: Diagnosis present

## 2022-02-10 DIAGNOSIS — R590 Localized enlarged lymph nodes: Secondary | ICD-10-CM | POA: Diagnosis present

## 2022-02-10 DIAGNOSIS — Z9689 Presence of other specified functional implants: Secondary | ICD-10-CM

## 2022-02-10 DIAGNOSIS — I509 Heart failure, unspecified: Secondary | ICD-10-CM

## 2022-02-10 DIAGNOSIS — R911 Solitary pulmonary nodule: Secondary | ICD-10-CM | POA: Diagnosis not present

## 2022-02-10 DIAGNOSIS — F17210 Nicotine dependence, cigarettes, uncomplicated: Secondary | ICD-10-CM

## 2022-02-10 DIAGNOSIS — I441 Atrioventricular block, second degree: Secondary | ICD-10-CM | POA: Diagnosis not present

## 2022-02-10 DIAGNOSIS — Z006 Encounter for examination for normal comparison and control in clinical research program: Secondary | ICD-10-CM | POA: Diagnosis not present

## 2022-02-10 DIAGNOSIS — I5022 Chronic systolic (congestive) heart failure: Secondary | ICD-10-CM | POA: Diagnosis present

## 2022-02-10 DIAGNOSIS — E782 Mixed hyperlipidemia: Secondary | ICD-10-CM | POA: Diagnosis not present

## 2022-02-10 DIAGNOSIS — Z79891 Long term (current) use of opiate analgesic: Secondary | ICD-10-CM

## 2022-02-10 DIAGNOSIS — Z823 Family history of stroke: Secondary | ICD-10-CM

## 2022-02-10 DIAGNOSIS — Z89421 Acquired absence of other right toe(s): Secondary | ICD-10-CM | POA: Diagnosis not present

## 2022-02-10 DIAGNOSIS — H538 Other visual disturbances: Secondary | ICD-10-CM | POA: Diagnosis not present

## 2022-02-10 DIAGNOSIS — Z888 Allergy status to other drugs, medicaments and biological substances status: Secondary | ICD-10-CM | POA: Diagnosis not present

## 2022-02-10 DIAGNOSIS — H532 Diplopia: Secondary | ICD-10-CM | POA: Diagnosis not present

## 2022-02-10 DIAGNOSIS — G2581 Restless legs syndrome: Secondary | ICD-10-CM | POA: Diagnosis present

## 2022-02-10 DIAGNOSIS — I4510 Unspecified right bundle-branch block: Secondary | ICD-10-CM | POA: Diagnosis not present

## 2022-02-10 DIAGNOSIS — I4891 Unspecified atrial fibrillation: Secondary | ICD-10-CM | POA: Diagnosis not present

## 2022-02-10 HISTORY — PX: TRANSCATHETER AORTIC VALVE REPLACEMENT, TRANSFEMORAL: SHX6400

## 2022-02-10 HISTORY — PX: INTRAOPERATIVE TRANSTHORACIC ECHOCARDIOGRAM: SHX6523

## 2022-02-10 HISTORY — DX: Presence of prosthetic heart valve: Z95.2

## 2022-02-10 LAB — ECHOCARDIOGRAM LIMITED
AR max vel: 5.16 cm2
AV Area VTI: 4.35 cm2
AV Area mean vel: 5.1 cm2
AV Mean grad: 2 mmHg
AV Peak grad: 4.5 mmHg
Ao pk vel: 1.06 m/s
S' Lateral: 5.5 cm

## 2022-02-10 LAB — POCT I-STAT, CHEM 8
BUN: 20 mg/dL (ref 8–23)
Calcium, Ion: 1.2 mmol/L (ref 1.15–1.40)
Chloride: 102 mmol/L (ref 98–111)
Creatinine, Ser: 1 mg/dL (ref 0.61–1.24)
Glucose, Bld: 118 mg/dL — ABNORMAL HIGH (ref 70–99)
HCT: 44 % (ref 39.0–52.0)
Hemoglobin: 15 g/dL (ref 13.0–17.0)
Potassium: 4.3 mmol/L (ref 3.5–5.1)
Sodium: 139 mmol/L (ref 135–145)
TCO2: 25 mmol/L (ref 22–32)

## 2022-02-10 LAB — TYPE AND SCREEN
ABO/RH(D): A POS
Antibody Screen: NEGATIVE

## 2022-02-10 SURGERY — IMPLANTATION, AORTIC VALVE, TRANSCATHETER, FEMORAL APPROACH
Anesthesia: Monitor Anesthesia Care

## 2022-02-10 MED ORDER — SODIUM CHLORIDE 0.9 % IV SOLN
INTRAVENOUS | Status: AC | PRN
  Administered 2022-02-10: 10 mL/h via INTRAVENOUS

## 2022-02-10 MED ORDER — TRAMADOL HCL 50 MG PO TABS: 50.0000 mg | ORAL_TABLET | ORAL | Status: DC | PRN

## 2022-02-10 MED ORDER — CEFAZOLIN SODIUM-DEXTROSE 2-4 GM/100ML-% IV SOLN
2.0000 g | Freq: Three times a day (TID) | INTRAVENOUS | Status: AC
  Administered 2022-02-10 (×2): 2 g via INTRAVENOUS
  Filled 2022-02-10 (×2): qty 100

## 2022-02-10 MED ORDER — PROPOFOL 500 MG/50ML IV EMUL
INTRAVENOUS | Status: DC | PRN
  Administered 2022-02-10: 10 ug/kg/min via INTRAVENOUS

## 2022-02-10 MED ORDER — TRAZODONE HCL 50 MG PO TABS
50.0000 mg | ORAL_TABLET | Freq: Every day | ORAL | Status: DC
  Administered 2022-02-10: 50 mg via ORAL
  Filled 2022-02-10: qty 1

## 2022-02-10 MED ORDER — ACETAMINOPHEN 650 MG RE SUPP: 650.0000 mg | Freq: Four times a day (QID) | RECTAL | Status: DC | PRN

## 2022-02-10 MED ORDER — CHLORHEXIDINE GLUCONATE CLOTH 2 % EX PADS
6.0000 | MEDICATED_PAD | Freq: Every day | CUTANEOUS | Status: DC
  Administered 2022-02-10 – 2022-02-11 (×2): 6 via TOPICAL

## 2022-02-10 MED ORDER — HEPARIN SODIUM (PORCINE) 1000 UNIT/ML IJ SOLN
INTRAMUSCULAR | Status: DC | PRN
  Administered 2022-02-10: 12000 [IU] via INTRAVENOUS

## 2022-02-10 MED ORDER — TAMSULOSIN HCL 0.4 MG PO CAPS
0.4000 mg | ORAL_CAPSULE | Freq: Every day | ORAL | Status: DC
  Administered 2022-02-10 – 2022-02-11 (×2): 0.4 mg via ORAL
  Filled 2022-02-10 (×2): qty 1

## 2022-02-10 MED ORDER — SODIUM CHLORIDE 0.9 % IV SOLN: INTRAVENOUS | Status: AC

## 2022-02-10 MED ORDER — SODIUM CHLORIDE 0.9 % IV SOLN: 250.0000 mL | INTRAVENOUS | Status: DC

## 2022-02-10 MED ORDER — CHLORHEXIDINE GLUCONATE 0.12 % MT SOLN
15.0000 mL | Freq: Once | OROMUCOSAL | Status: DC
  Filled 2022-02-10: qty 15

## 2022-02-10 MED ORDER — SODIUM CHLORIDE 0.9% FLUSH: 3.0000 mL | INTRAVENOUS | Status: DC | PRN

## 2022-02-10 MED ORDER — EZETIMIBE 10 MG PO TABS
10.0000 mg | ORAL_TABLET | Freq: Every evening | ORAL | Status: DC
  Administered 2022-02-10: 10 mg via ORAL
  Filled 2022-02-10: qty 1

## 2022-02-10 MED ORDER — NITROGLYCERIN IN D5W 200-5 MCG/ML-% IV SOLN: 0.0000 ug/min | INTRAVENOUS | Status: DC

## 2022-02-10 MED ORDER — CHLORHEXIDINE GLUCONATE 4 % EX LIQD: 60.0000 mL | Freq: Once | CUTANEOUS | Status: DC

## 2022-02-10 MED ORDER — EMPAGLIFLOZIN 10 MG PO TABS
10.0000 mg | ORAL_TABLET | Freq: Every day | ORAL | Status: DC
  Administered 2022-02-10 – 2022-02-11 (×2): 10 mg via ORAL
  Filled 2022-02-10 (×3): qty 1

## 2022-02-10 MED ORDER — HEPARIN (PORCINE) IN NACL 1000-0.9 UT/500ML-% IV SOLN
INTRAVENOUS | Status: AC
  Filled 2022-02-10: qty 500

## 2022-02-10 MED ORDER — SODIUM CHLORIDE 0.9 % IV SOLN: INTRAVENOUS | Status: DC

## 2022-02-10 MED ORDER — PHENYLEPHRINE 80 MCG/ML (10ML) SYRINGE FOR IV PUSH (FOR BLOOD PRESSURE SUPPORT)
PREFILLED_SYRINGE | INTRAVENOUS | Status: DC | PRN
  Administered 2022-02-10: 160 ug via INTRAVENOUS

## 2022-02-10 MED ORDER — CLOPIDOGREL BISULFATE 75 MG PO TABS
75.0000 mg | ORAL_TABLET | Freq: Every day | ORAL | Status: DC
  Administered 2022-02-10 – 2022-02-11 (×2): 75 mg via ORAL
  Filled 2022-02-10 (×2): qty 1

## 2022-02-10 MED ORDER — LIDOCAINE HCL (PF) 1 % IJ SOLN
INTRAMUSCULAR | Status: AC
  Filled 2022-02-10: qty 30

## 2022-02-10 MED ORDER — CITALOPRAM HYDROBROMIDE 20 MG PO TABS
20.0000 mg | ORAL_TABLET | Freq: Every day | ORAL | Status: DC
  Administered 2022-02-10 – 2022-02-11 (×2): 20 mg via ORAL
  Filled 2022-02-10 (×2): qty 1

## 2022-02-10 MED ORDER — LACTATED RINGERS IV SOLN: INTRAVENOUS | Status: DC | PRN

## 2022-02-10 MED ORDER — SODIUM CHLORIDE 0.9 % IV SOLN
INTRAVENOUS | Status: AC
  Administered 2022-02-10: 50 mL/h via INTRAVENOUS

## 2022-02-10 MED ORDER — HEPARIN SODIUM (PORCINE) 1000 UNIT/ML IJ SOLN
INTRAMUSCULAR | Status: AC
  Filled 2022-02-10: qty 10

## 2022-02-10 MED ORDER — CHLORHEXIDINE GLUCONATE 4 % EX LIQD: 30.0000 mL | CUTANEOUS | Status: DC

## 2022-02-10 MED ORDER — SUGAMMADEX SODIUM 200 MG/2ML IV SOLN
INTRAVENOUS | Status: DC | PRN
  Administered 2022-02-10: 200 mg via INTRAVENOUS

## 2022-02-10 MED ORDER — SODIUM CHLORIDE 0.9 % IV SOLN
0.0125 ug/kg/min | INTRAVENOUS | Status: DC
  Filled 2022-02-10: qty 1000

## 2022-02-10 MED ORDER — FENTANYL CITRATE (PF) 250 MCG/5ML IJ SOLN
INTRAMUSCULAR | Status: DC | PRN
  Administered 2022-02-10 (×2): 25 ug via INTRAVENOUS

## 2022-02-10 MED ORDER — HEPARIN (PORCINE) IN NACL 1000-0.9 UT/500ML-% IV SOLN
INTRAVENOUS | Status: DC | PRN
  Administered 2022-02-10 (×3): 500 mL

## 2022-02-10 MED ORDER — LACTATED RINGERS IV SOLN: INTRAVENOUS | Status: DC

## 2022-02-10 MED ORDER — PROPOFOL 10 MG/ML IV BOLUS
INTRAVENOUS | Status: DC | PRN
  Administered 2022-02-10: 10 mg via INTRAVENOUS
  Administered 2022-02-10: 80 mg via INTRAVENOUS

## 2022-02-10 MED ORDER — PRAVASTATIN SODIUM 40 MG PO TABS
20.0000 mg | ORAL_TABLET | Freq: Every day | ORAL | Status: DC
  Administered 2022-02-10 – 2022-02-11 (×2): 20 mg via ORAL
  Filled 2022-02-10 (×2): qty 1

## 2022-02-10 MED ORDER — LORAZEPAM 1 MG PO TABS
1.0000 mg | ORAL_TABLET | Freq: Every day | ORAL | Status: DC
  Administered 2022-02-10: 1 mg via ORAL
  Filled 2022-02-10: qty 1

## 2022-02-10 MED ORDER — GABAPENTIN 600 MG PO TABS
600.0000 mg | ORAL_TABLET | Freq: Every morning | ORAL | Status: DC
  Administered 2022-02-11: 600 mg via ORAL
  Filled 2022-02-10: qty 1

## 2022-02-10 MED ORDER — LIDOCAINE 2% (20 MG/ML) 5 ML SYRINGE
INTRAMUSCULAR | Status: DC | PRN
  Administered 2022-02-10: 20 mg via INTRAVENOUS
  Administered 2022-02-10: 60 mg via INTRAVENOUS

## 2022-02-10 MED ORDER — ONDANSETRON HCL 4 MG/2ML IJ SOLN
INTRAMUSCULAR | Status: DC | PRN
  Administered 2022-02-10: 4 mg via INTRAVENOUS

## 2022-02-10 MED ORDER — SODIUM CHLORIDE 0.9 % IV SOLN: 250.0000 mL | INTRAVENOUS | Status: DC | PRN

## 2022-02-10 MED ORDER — SODIUM CHLORIDE 0.9% FLUSH
3.0000 mL | Freq: Two times a day (BID) | INTRAVENOUS | Status: DC
  Administered 2022-02-10 – 2022-02-11 (×2): 3 mL via INTRAVENOUS

## 2022-02-10 MED ORDER — SUCCINYLCHOLINE CHLORIDE 200 MG/10ML IV SOSY
PREFILLED_SYRINGE | INTRAVENOUS | Status: DC | PRN
  Administered 2022-02-10: 100 mg via INTRAVENOUS

## 2022-02-10 MED ORDER — OXYCODONE HCL 5 MG PO TABS
5.0000 mg | ORAL_TABLET | ORAL | Status: DC | PRN
  Administered 2022-02-10: 5 mg via ORAL
  Administered 2022-02-10 – 2022-02-11 (×3): 10 mg via ORAL
  Filled 2022-02-10 (×4): qty 2

## 2022-02-10 MED ORDER — ACETAMINOPHEN 325 MG PO TABS
650.0000 mg | ORAL_TABLET | Freq: Four times a day (QID) | ORAL | Status: DC | PRN
  Administered 2022-02-10: 650 mg via ORAL
  Filled 2022-02-10: qty 2

## 2022-02-10 MED ORDER — IOHEXOL 350 MG/ML SOLN
INTRAVENOUS | Status: DC | PRN
  Administered 2022-02-10: 40 mL

## 2022-02-10 MED ORDER — PANTOPRAZOLE SODIUM 40 MG PO TBEC
40.0000 mg | DELAYED_RELEASE_TABLET | Freq: Every day | ORAL | Status: DC
  Administered 2022-02-10 – 2022-02-11 (×2): 40 mg via ORAL
  Filled 2022-02-10 (×2): qty 1

## 2022-02-10 MED ORDER — ONDANSETRON HCL 4 MG/2ML IJ SOLN: 4.0000 mg | Freq: Four times a day (QID) | INTRAMUSCULAR | Status: DC | PRN

## 2022-02-10 MED ORDER — PROTAMINE SULFATE 10 MG/ML IV SOLN
INTRAVENOUS | Status: DC | PRN
  Administered 2022-02-10: 90 mg via INTRAVENOUS
  Administered 2022-02-10: 30 mg via INTRAVENOUS

## 2022-02-10 MED ORDER — MORPHINE SULFATE (PF) 2 MG/ML IV SOLN: 1.0000 mg | INTRAVENOUS | Status: DC | PRN

## 2022-02-10 MED ORDER — ROCURONIUM BROMIDE 100 MG/10ML IV SOLN
INTRAVENOUS | Status: DC | PRN
  Administered 2022-02-10: 10 mg via INTRAVENOUS
  Administered 2022-02-10: 50 mg via INTRAVENOUS

## 2022-02-10 SURGICAL SUPPLY — 34 items
BAG SNAP BAND KOVER 36X36 (MISCELLANEOUS) ×4 IMPLANT
CABLE ADAPT PACING TEMP 12FT (ADAPTER) IMPLANT
CATH 26 ULTRA DELIVERY (CATHETERS) IMPLANT
CATH DIAG 6FR JR4 (CATHETERS) IMPLANT
CATH DIAG 6FR PIGTAIL ANGLED (CATHETERS) IMPLANT
CATH INFINITI 5FR ANG PIGTAIL (CATHETERS) IMPLANT
CATH INFINITI 6F AL2 (CATHETERS) IMPLANT
CATH S G BIP PACING (CATHETERS) IMPLANT
CATH VISTA GUIDE 6FR JR4 (CATHETERS) IMPLANT
CLOSURE MYNX CONTROL 6F/7F (Vascular Products) IMPLANT
CLOSURE PERCLOSE PROSTYLE (VASCULAR PRODUCTS) IMPLANT
CRIMPER (MISCELLANEOUS) IMPLANT
DEVICE INFLATION ATRION QL2530 (MISCELLANEOUS) IMPLANT
GUIDEWIRE SAFE TJ AMPLATZ EXST (WIRE) IMPLANT
KIT HEART LEFT (KITS) ×2 IMPLANT
KIT MICROPUNCTURE NIT STIFF (SHEATH) IMPLANT
KIT SAPIAN 3 ULTRA RESILIA 26 (Valve) IMPLANT
PACK CARDIAC CATHETERIZATION (CUSTOM PROCEDURE TRAY) ×2 IMPLANT
SHEATH BRITE TIP 6FR 35CM (SHEATH) IMPLANT
SHEATH BRITE TIP 7FR 35CM (SHEATH) IMPLANT
SHEATH INTRODUCER SET 20-26 (SHEATH) IMPLANT
SHEATH PINNACLE 6F 10CM (SHEATH) IMPLANT
SHEATH PINNACLE 8F 10CM (SHEATH) IMPLANT
SHEATH PROBE COVER 6X72 (BAG) IMPLANT
SLEEVE REPOSITIONING LENGTH 30 (MISCELLANEOUS) IMPLANT
STOPCOCK MORSE 400PSI 3WAY (MISCELLANEOUS) ×4 IMPLANT
SYR MEDRAD MARK V 150ML (SYRINGE) IMPLANT
TRANSDUCER W/STOPCOCK (MISCELLANEOUS) ×4 IMPLANT
TUBING CONTRAST HIGH PRESS 48 (TUBING) IMPLANT
WIRE EMERALD 3MM-J .035X150CM (WIRE) IMPLANT
WIRE EMERALD 3MM-J .035X260CM (WIRE) IMPLANT
WIRE EMERALD ST .035X260CM (WIRE) ×2 IMPLANT
WIRE SAFARI SM CURVE 275 (WIRE) IMPLANT
WIRE STIFF LUNDERQUIST 260CM (WIRE) IMPLANT

## 2022-02-10 NOTE — Discharge Summary (Addendum)
[place] VALVE TEAM  Discharge Summary    Patient ID: TALIB HEADLEY MRN: [id number]; DOB: 1932/03/30  Admit date: 02/10/2022 Discharge date: 02/11/2022  Primary Care Provider: Kathalene Frames, MD  Primary Cardiologist: Werner Lean, MD / Dr. Angelena Form, MD & Dr. Tenny Craw, MD (TAVR)  Discharge Diagnoses    Principal Problem:   S/P TAVR (transcatheter aortic valve replacement) Active Problems:   Mixed hyperlipidemia   Tobacco abuse   Chronic systolic congestive heart failure (HCC)   Frequent PVCs   SVT (supraventricular tachycardia)   S/P insertion of spinal cord stimulator   Bacterial endocarditis   Coronary artery disease   Chronic kidney disease, stage 3a (HCC)   Severe aortic valve stenosis  Allergies Allergies  Allergen Reactions   Simvastatin Other (See Comments)    Leg pains    Atorvastatin Other (See Comments)    Muscle aches   Benazepril Hcl Other (See Comments)    Pt does not know reaction    Losartan Other (See Comments)    Pt does not have reaction    Pravastatin Other (See Comments)    Leg pains    Rosuvastatin Other (See Comments)    Leg pains    Spironolactone Other (See Comments)    Pt does not reaction     Diagnostic Studies/Procedures    [facility]  TAVR OPERATIVE NOTE     Date of Procedure:                02/10/2022   Preoperative Diagnosis:      Severe Aortic Stenosis    Postoperative Diagnosis:    Same    Procedure:        Transcatheter Aortic Valve Replacement - Transfemoral Approach             Edwards Sapien 3 THV (size 26 mm, model # [id number], serial # [id number])              Co-Surgeons:                        Lauree Chandler, MD and Justice Rocher , MD    Anesthesiologist:                  Smith Robert   Echocardiographer:              Gasper Sells   Pre-operative Echo Findings: Severe aortic stenosis Moderate left ventricular systolic dysfunction    Post-operative Echo Findings:       Trivial paravalvular leak Moderate left ventricular systolic dysfunction _____________    Echo 02/11/22: completed but pending formal read at the time of discharge   History of Present Illness     Ruben Barrera is a 88 y.o. male with a history of LAFB, RBBB, moderate CAD, NICM, depression, GERD, hiatal hernia, HTN, HLD, prior CVA, SVT, PVCs, mitral endocarditis 02/2021, progressive LV dysfunction and severe LFLG aortic stenosis who presented to [facility] on 02/10/22 for planned TAVR.   Mr. Goren is followed by Dr. Gasper Sells for his cardiology care. He was found to have low flow/low gradient aortic stenosis in early 2022. Echocardiogram 12/02/21 showed an LVEF at 20-25% with mild MR, aortic valve is thickening with poor leaflet mobility with a mean gradient 37 mmHg, peak gradient 62 mmHg, AVA 0.59 cm2, and DI 0.13 consistent with severe AS. His LV function had been slowly falling over the past year despite GDMT. [facility] 03/2020  showed moderate calcified CAD (50-60% proximal to mid LAD stenosis, 50-60% distal LAD stenosis, 50% intermediate branch stenosis, 30% proximal Circumflex stenosis) with continued medical management recommended. He saw Dr. Gasper Sells 12/08/21 at which time he had no good explanation for his continued drop in EF and he was referred to the structural heart team for further evaluation of his aortic stenosis.   The patient was evaluated by the multidisciplinary valve team and felt to have severe, symptomatic aortic stenosis and to be a suitable candidate for TAVR, which was set up for 02/10/22. Of note PAT ECG showed a new RBBB in addition to previous LAFB.  Hospital Course     Consultants: none  Severe AS: s/p successful TAVR with a 26 mm Edwards Sapien 3 Ultra Resilia  THV via the TF approach on 02/10/22. Post operative echo completed but pending formal read. Groin sites are stable. Resumed on home Plavix '75mg'$  daily. Plan for discharge  home today with close follow up in the office next week.    2:1 AV block: pt went into transient HAVB after TAVR. Of note, his PAT ECG showed an underlying RBBB/LAFB. Temp wire was left in place overnight. He has not paced overnight or today and it was pulled early this AM. ECG with sinus, RBBB, LAFB and PVCs but no high grade heart block. Plan to resume home Toprol XL now given frequent ectopy and monitor. Would like to keep another 24 hours to monitor but pt is adamant about getting home to be with his wife. We will discharge him late this afternoon with a Zio AT.   PVCS: noted to have frequent PVCs and NSVT. Resumed on home Toprol XL.  CAD: cath 03/2020 showed moderate calcified CAD (50-60% proximal to mid LAD stenosis, 50-60% distal LAD stenosis, 50% intermediate branch stenosis, 30% proximal Circumflex stenosis). Continue medical therapy.   Lymphadenopathy: pre TAVR CTs showed enlarged right hilar and subcarinal lymph nodes, likely reactive. Recommend follow-up chest CT in 3 months to ensure resolution. This will be discussed in the outpatient setting.  Pulmonary nodule: pre TAVR CTs showed a solid subpleural pulmonary nodule of the right middle lobe measuring 4 mm. No follow-up needed if patient is low-risk. This will be discussed in the outpatient setting.  ADDEND: Reassessed the patient prior to discharge and continues to have increased PVCs on telemetry. Will plan to increase Toprol to '25mg'$  (from 12.'5mg'$ ) QD. BP stable. ZIO placed therefore can monitor recurrence of high grade block and quantify PVC burden.  _____________  Discharge Vitals Blood pressure (!) 134/94, pulse 82, temperature 97.7 F (36.5 C), temperature source Oral, resp. rate 16, height '5\' 10"'$  (1.778 m), weight 83.1 kg, SpO2 93 %.  Filed Weights   02/09/22 2200 02/10/22 0655 02/11/22 0500  Weight: 78.2 kg 78.5 kg 83.1 kg    GEN: Well nourished, well developed, in no acute distress HEENT: normal Neck: no JVD or  masses Cardiac: RRR; no murmurs, rubs, or gallops,no edema  Respiratory:  clear to auscultation bilaterally, normal work of breathing GI: soft, nontender, nondistended, + BS MS: no deformity or atrophy Skin: warm and dry, no rash.  Groin sites clear without hematoma or ecchymosis  Neuro:  Alert and Oriented x 3, Strength and sensation are intact Psych: euthymic mood, full affect   Labs & Radiologic Studies    CBC Recent Labs    02/10/22 1310 02/11/22 0527  WBC  --  12.8*  HGB 15.0 15.1  HCT 44.0 45.0  MCV  --  96.4  PLT  --  671   Basic Metabolic Panel Recent Labs    02/10/22 1310 02/11/22 0251 02/11/22 0527  NA 139 136  --   K 4.3 5.0  --   CL 102 108  --   CO2  --  15*  --   GLUCOSE 118* 96  --   BUN 20 20  --   CREATININE 1.00 1.05  --   CALCIUM  --  8.2*  --   MG  --   --  2.2   Liver Function Tests No results for input(s): "AST", "ALT", "ALKPHOS", "BILITOT", "PROT", "ALBUMIN" in the last 72 hours. No results for input(s): "LIPASE", "AMYLASE" in the last 72 hours. Cardiac Enzymes No results for input(s): "CKTOTAL", "CKMB", "CKMBINDEX", "TROPONINI" in the last 72 hours. BNP Invalid input(s): "POCBNP" D-Dimer No results for input(s): "DDIMER" in the last 72 hours. Hemoglobin A1C No results for input(s): "HGBA1C" in the last 72 hours. Fasting Lipid Panel No results for input(s): "CHOL", "HDL", "LDLCALC", "TRIG", "CHOLHDL", "LDLDIRECT" in the last 72 hours. Thyroid Function Tests No results for input(s): "TSH", "T4TOTAL", "T3FREE", "THYROIDAB" in the last 72 hours.  Invalid input(s): "FREET3" _____________  ECHOCARDIOGRAM COMPLETE  Result Date: 02/11/2022    ECHOCARDIOGRAM REPORT   Patient Name:   Ruben Barrera Date of Exam: 02/11/2022 Medical Rec #:  [id number]      Height:       70.0 in Accession #:    [id number]     Weight:       183.2 lb Date of Birth:  1932-04-16       BSA:          2.011 m Patient Age:    88 years       BP:           135/78 mmHg  Patient Gender: M              HR:           77 bpm. Exam Location:  Inpatient Procedure: 2D Echo, Cardiac Doppler, Color Doppler and Intracardiac            Opacification Agent Indications:    Post TAVR evaluation V43.3 / Z95.2  History:        Patient has prior history of Echocardiogram examinations, most                 recent 02/10/2022. CAD, Stroke; Risk Factors:Hypertension and                 GERD.                 Aortic Valve: 26 mm Sapien prosthetic, stented (TAVR) valve is                 present in the aortic position. Procedure Date: 02/10/22.  Sonographer:    Bernadene Person RDCS Referring Phys: [phone] Brannan Cassedy D Zadyn Yardley  Sonographer Comments: Image acquisition challenging due to uncooperative patient. IMPRESSIONS  1. Left ventricular ejection fraction, by estimation, is 25 to 30%. The left ventricle has severely decreased function. The left ventricle demonstrates global hypokinesis. The left ventricular internal cavity size was severely dilated. Left ventricular diastolic parameters are consistent with Grade II diastolic dysfunction (pseudonormalization).  2. Right ventricular systolic function is hyperdynamic. The right ventricular size is normal. There is normal pulmonary artery systolic pressure.  3. Left atrial size was severely dilated.  4. Right atrial size was mildly dilated.  5. The  mitral valve is grossly normal. Mild mitral valve regurgitation. No evidence of mitral stenosis.  6. The aortic valve has been replaced. Aortic valve regurgitation is trivial, paravalvular and seen in the PLAX view.. There is a 26 mm Sapien prosthetic (TAVR) valve present in the aortic position. Procedure Date: 02/10/22. Erfective orifice area, by VTI measures 2.00 cm. Aortic valve mean gradient measures 13.0 mmHg. Peak gradient 22 m Hg. DVI 0.41. Acceleration time 88 ms. Comparison(s): Stable gradients. Slight PVL despite extra CC given at deployment. Increased gradient despites low EF like reflects mild patient  prosthesis mismatch. FINDINGS  Left Ventricle: Left ventricular ejection fraction, by estimation, is 25 to 30%. The left ventricle has severely decreased function. The left ventricle demonstrates global hypokinesis. Definity contrast agent was given IV to delineate the left ventricular endocardial borders. The left ventricular internal cavity size was severely dilated. There is borderline left ventricular hypertrophy. Left ventricular diastolic parameters are consistent with Grade II diastolic dysfunction (pseudonormalization). Right Ventricle: The right ventricular size is normal. No increase in right ventricular wall thickness. Right ventricular systolic function is hyperdynamic. There is normal pulmonary artery systolic pressure. The tricuspid regurgitant velocity is 2.43 m/s, and with an assumed right atrial pressure of 3 mmHg, the estimated right ventricular systolic pressure is 93.2 mmHg. Left Atrium: Left atrial size was severely dilated. Right Atrium: Right atrial size was mildly dilated. Pericardium: Trivial pericardial effusion is present. The pericardial effusion is posterior and lateral to the left ventricle. Mitral Valve: The mitral valve is grossly normal. Mild mitral valve regurgitation. No evidence of mitral valve stenosis. Tricuspid Valve: The tricuspid valve is normal in structure. Tricuspid valve regurgitation is mild . No evidence of tricuspid stenosis. Aortic Valve: The aortic valve has been repaired/replaced. Aortic valve regurgitation is trivial. Aortic valve mean gradient measures 13.0 mmHg. Aortic valve peak gradient measures 18.5 mmHg. Aortic valve area, by VTI measures 2.00 cm. There is a 26 mm Sapien prosthetic, stented (TAVR) valve present in the aortic position. Procedure Date: 02/10/22. Pulmonic Valve: The pulmonic valve was normal in structure. Pulmonic valve regurgitation is trivial. No evidence of pulmonic stenosis. Aorta: The aortic root and ascending aorta are structurally  normal, with no evidence of dilitation. IAS/Shunts: No atrial level shunt detected by color flow Doppler.  LEFT VENTRICLE PLAX 2D LVIDd:         6.90 cm      Diastology LVIDs:         5.40 cm      LV e' medial:    3.77 cm/s LV PW:         0.90 cm      LV E/e' medial:  20.3 LV IVS:        0.80 cm      LV e' lateral:   11.60 cm/s LVOT diam:     2.50 cm      LV E/e' lateral: 6.6 LV SV:         84 LV SV Index:   42 LVOT Area:     4.91 cm  LV Volumes (MOD) LV vol d, MOD A2C: 169.0 ml LV vol d, MOD A4C: 184.0 ml LV vol s, MOD A2C: 107.0 ml LV vol s, MOD A4C: 100.0 ml LV SV MOD A2C:     62.0 ml LV SV MOD A4C:     184.0 ml LV SV MOD BP:      72.7 ml RIGHT VENTRICLE RV S prime:     11.30 cm/s TAPSE (M-mode): 2.0  cm LEFT ATRIUM              Index        RIGHT ATRIUM           Index LA diam:        5.80 cm  2.88 cm/m   RA Area:     13.40 cm LA Vol (A2C):   99.3 ml  49.38 ml/m  RA Volume:   27.40 ml  13.63 ml/m LA Vol (A4C):   110.0 ml 54.70 ml/m LA Biplane Vol: 105.0 ml 52.21 ml/m  AORTIC VALVE AV Area (Vmax):    2.09 cm AV Area (Vmean):   2.02 cm AV Area (VTI):     2.00 cm AV Vmax:           215.00 cm/s AV Vmean:          149.667 cm/s AV VTI:            0.420 m AV Peak Grad:      18.5 mmHg AV Mean Grad:      13.0 mmHg LVOT Vmax:         91.53 cm/s LVOT Vmean:        61.733 cm/s LVOT VTI:          0.171 m LVOT/AV VTI ratio: 0.41  AORTA Ao Root diam: 2.90 cm Ao Asc diam:  3.20 cm MITRAL VALVE               TRICUSPID VALVE MV Area (PHT): 3.03 cm    TR Peak grad:   23.6 mmHg MV Decel Time: 250 msec    TR Vmax:        243.00 cm/s MV E velocity: 76.40 cm/s MV A velocity: 45.60 cm/s  SHUNTS MV E/A ratio:  1.68        Systemic VTI:  0.17 m                            Systemic Diam: 2.50 cm Rudean Haskell MD Electronically signed by Rudean Haskell MD Signature Date/Time: 02/11/2022/1:01:50 PM    Final    ECHOCARDIOGRAM LIMITED  Result Date: 02/10/2022    ECHOCARDIOGRAM LIMITED REPORT   Patient Name:   Ruben Barrera Date of Exam: 02/10/2022 Medical Rec #:  [id number]      Height:       70.0 in Accession #:    [id number]     Weight:       173.0 lb Date of Birth:  Jul 31, 1932       BSA:          1.963 m Patient Age:    69 years       BP:           141/100 mmHg Patient Gender: M              HR:           94 bpm. Exam Location:  Inpatient Procedure: Limited Echo, Cardiac Doppler and Color Doppler Indications:    I35.0 Nonrheumatic aortic (valve) stenosis  History:        Patient has prior history of Echocardiogram examinations, most                 recent 12/02/2021. CAD, Abnormal ECG, Aortic Valve Disease,                 Arrythmias:SVT; Risk Factors:Dyslipidemia and Current Smoker.  Severe aortic stenosis.                 Aortic Valve: 26 mm Sapien prosthetic, stented (TAVR) valve is                 present in the aortic position. Procedure Date: 02/10/2022.  Sonographer:    Roseanna Rainbow RDCS Referring Phys: Burnell Blanks  Sonographer Comments: TAVR procedure IMPRESSIONS  1. Prior to procedure. Severe aortic stenosis. No AI. DVI 0.26. Mean gradient 27 mm Hg, and Peak gradient 46 mm Hg. EOA 0.88 cm2.  2. There is a 26 mm Sapien prosthetic (TAVR) valve present in the aortic position. Procedure Date: 02/10/2022. Trivial PVL at the PSAX one o'clock position (seen after wire removal). Mean gradient 2 mmHg, Peak gradient 4 mm Hg, AVA 4.25 cm2. Normal DVI.  3. Left ventricular ejection fraction, by estimation, is 25 to 30%. The left ventricle has severely decreased function. The left ventricular internal cavity size was mildly dilated.  4. Right ventricular systolic function is low normal.  5. The mitral valve is abnormal. Moderate mitral valve regurgitation. No evidence of mitral stenosis. Comparison(s): Successfull TAVR placement. FINDINGS  Left Ventricle: Left ventricular ejection fraction, by estimation, is 25 to 30%. The left ventricle has severely decreased function. The left ventricular internal cavity  size was mildly dilated. There is no left ventricular hypertrophy. Right Ventricle: Right ventricular systolic function is low normal. Mitral Valve: The mitral valve is abnormal. Moderate mitral valve regurgitation. No evidence of mitral valve stenosis. Tricuspid Valve: The tricuspid valve is not well visualized. Tricuspid valve regurgitation is not demonstrated. No evidence of tricuspid stenosis. Aortic Valve: Aortic valve mean gradient measures 2.0 mmHg. Aortic valve peak gradient measures 4.5 mmHg. Aortic valve area, by VTI measures 4.35 cm. There is a 26 mm Sapien prosthetic, stented (TAVR) valve present in the aortic position. Procedure Date: 02/10/2022. Additional Comments: Spectral Doppler performed. Color Doppler performed.  LEFT VENTRICLE PLAX 2D LVIDd:         6.30 cm LVIDs:         5.50 cm LV PW:         1.00 cm LV IVS:        1.30 cm LVOT diam:     2.60 cm LV SV:         94 LV SV Index:   48 LVOT Area:     5.31 cm  AORTIC VALVE AV Area (Vmax):    5.16 cm AV Area (Vmean):   5.10 cm AV Area (VTI):     4.35 cm AV Vmax:           106.00 cm/s AV Vmean:          69.400 cm/s AV VTI:            0.216 m AV Peak Grad:      4.5 mmHg AV Mean Grad:      2.0 mmHg LVOT Vmax:         103.00 cm/s LVOT Vmean:        66.600 cm/s LVOT VTI:          0.177 m LVOT/AV VTI ratio: 0.82  SHUNTS Systemic VTI:  0.18 m Systemic Diam: 2.60 cm Rudean Haskell MD Electronically signed by Rudean Haskell MD Signature Date/Time: 02/10/2022/2:01:19 PM    Final    Structural Heart Procedure  Result Date: 02/10/2022 See surgical note for result.  DG Chest 2 View  Result Date: 02/08/2022 CLINICAL DATA:  Preop exam for TAVR.  History of aortic stenosis. EXAM: CHEST - 2 VIEW COMPARISON:  October 17, 2021 FINDINGS: The heart size and mediastinal contours are stable. The aorta is tortuous. The heart size is enlarged. Spinal stimulator device overlies the thoracic spine unchanged. Both lungs are clear. The visualized skeletal  structures are stable. IMPRESSION: No active cardiopulmonary disease. Cardiomegaly. Electronically Signed   By: Abelardo Diesel M.D.   On: 02/08/2022 08:53   Disposition   Pt is being discharged home today in good condition.  Follow-up Plans & Appointments    Follow-up Information     Eileen Stanford, PA-C Follow up on 02/18/2022.   Specialties: Cardiology, Radiology Why: @ 3:30pm. Please arrive at 3:15pm for your appointment Contact information: [address] [place] [id number] [phone]                Discharge Instructions     Call MD for:  difficulty breathing, headache or visual disturbances   Complete by: As directed    Call MD for:  extreme fatigue   Complete by: As directed    Call MD for:  hives   Complete by: As directed    Call MD for:  persistant dizziness or light-headedness   Complete by: As directed    Call MD for:  persistant nausea and vomiting   Complete by: As directed    Call MD for:  redness, tenderness, or signs of infection (pain, swelling, redness, odor or green/yellow discharge around incision site)   Complete by: As directed    Call MD for:  severe uncontrolled pain   Complete by: As directed    Call MD for:  temperature >100.4   Complete by: As directed    Diet - low sodium heart healthy   Complete by: As directed    Discharge instructions   Complete by: As directed    ACTIVITY AND EXERCISE  Daily activity and exercise are an important part of your recovery. People recover at different rates depending on their general health and type of valve procedure.  Most people recovering from TAVR feel better relatively quickly   No lifting, pushing, pulling more than 10 pounds (examples to avoid: groceries, vacuuming, gardening, golfing):             - For one week with a procedure through the groin.             - For six weeks for procedures through the chest wall or neck. NOTE: You will typically see one of our  providers 7-14 days after your procedure to discuss [place] the above activities.      DRIVING  Do not drive until you are seen for follow up and cleared by a provider. Generally, we ask patient to not drive for 1 week after their procedure.  If you have been told by your doctor in the past that you may not drive, you must talk with him/her before you begin driving again.   DRESSING  Groin site: you may leave the clear dressing over the site for up to one week or until it falls off.   HYGIENE  If you had a femoral (leg) procedure, you may take a shower when you return home. After the shower, pat the site dry. Do NOT use powder, oils or lotions in your groin area until the site has completely healed.  If you had a chest procedure, you may shower when you return home unless  specifically instructed not to by your discharging practitioner.             - DO NOT scrub incision; pat dry with a towel.             - DO NOT apply any lotions, oils, powders to the incision.             - No tub baths / swimming for at least 2 weeks.  If you notice any fevers, chills, increased pain, swelling, bleeding or pus, please contact your doctor.   ADDITIONAL INFORMATION  If you are going to have an upcoming dental procedure, please contact our office as you will require antibiotics ahead of time to prevent infection on your heart valve.    If you have any questions or concerns you can call the structural heart phone during normal business hours 8am-4pm. If you have an urgent need after hours or weekends please call [phone] to talk to the on call provider for general cardiology. If you have an emergency that requires immediate attention, please call 911.    After TAVR Checklist  Check  Test Description  Follow up appointment in 1-2 weeks  You will see our structural heart advanced practice provider. Your incision sites will be checked and you will be cleared to drive and resume all normal  activities if you are doing well.    1 month echo and follow up  You will have an echo to check on your new heart valve and be seen back in the office by a structural heart advanced practice provider.  Follow up with your primary cardiologist You will need to be seen by your primary cardiologist in the following 3-6 months after your 1 month appointment in the valve clinic.   1 year echo and follow up You will have another echo to check on your heart valve after 1 year and be seen back in the office by a structural heart advanced practice provider. This your last structural heart visit.  Bacterial endocarditis prophylaxis  You will have to take antibiotics for the rest of your life before all dental procedures (even teeth cleanings) to protect your heart valve. Antibiotics are also required before some surgeries. Please check with your cardiologist before scheduling any surgeries. Also, please make sure to tell us if you have a penicillin allergy as you will require an alternative antibiotic.   Increase activity slowly   Complete by: As directed       Discharge Medications   Allergies as of 02/11/2022       Reactions   Simvastatin Other (See Comments)   Leg pains   Atorvastatin Other (See Comments)   Muscle aches   Benazepril Hcl Other (See Comments)   Pt does not know reaction    Losartan Other (See Comments)   Pt does not have reaction    Pravastatin Other (See Comments)   Leg pains    Rosuvastatin Other (See Comments)   Leg pains    Spironolactone Other (See Comments)   Pt does not reaction         Medication List     TAKE these medications    citalopram 20 MG tablet Commonly known as: CELEXA Take 20 mg by mouth daily.   clopidogrel 75 MG tablet Commonly known as: PLAVIX Take 1 tablet (75 mg total) by mouth daily.   ezetimibe 10 MG tablet Commonly known as: ZETIA Take 1 tablet (10 mg total) by mouth every evening.  fluticasone 50 MCG/ACT nasal spray Commonly  known as: FLONASE Place 1 spray into both nostrils daily as needed for allergies.   furosemide 20 MG tablet Commonly known as: LASIX Take 1 tablet (20 mg total) by mouth daily. Take 2 tablets daily in case of weight gain 2 to 3 lbs in 24 hrs or 5 lbs in 7 days.   gabapentin 600 MG tablet Commonly known as: NEURONTIN Take 600 mg by mouth every morning.   HYDROcodone-acetaminophen 7.5-325 MG tablet Commonly known as: NORCO Take 1 tablet by mouth every 8 (eight) hours as needed for moderate pain. What changed: when to take this   Jardiance 10 MG Tabs tablet Generic drug: empagliflozin Take 10 mg by mouth daily.   LORazepam 1 MG tablet Commonly known as: ATIVAN Take 1 mg by mouth at bedtime.   meclizine 12.5 MG tablet Commonly known as: ANTIVERT Take 12.5 mg by mouth 3 (three) times daily as needed for dizziness.   metoprolol succinate 25 MG 24 hr tablet Commonly known as: TOPROL-XL Take 1 tablet (25 mg total) by mouth daily. Start taking on: February 12, 2022   Multiple Vitamin tablet Take 1 tablet by mouth daily.   omeprazole 20 MG capsule Commonly known as: PRILOSEC Take 20 mg by mouth daily.   pravastatin 20 MG tablet Commonly known as: PRAVACHOL Take 20 mg by mouth daily.   senna-docusate 8.6-50 MG tablet Commonly known as: Senokot-S Take 1 tablet by mouth 2 (two) times daily. What changed: when to take this   tamsulosin 0.4 MG Caps capsule Commonly known as: FLOMAX Take 1 capsule (0.4 mg total) by mouth daily.   traZODone 50 MG tablet Commonly known as: DESYREL Take 50 mg by mouth at bedtime.   triamcinolone cream 0.1 % Commonly known as: KENALOG Apply 1 application topically daily as needed (irritation).         Outstanding Labs/Studies   none  Duration of Discharge Encounter   Greater than 30 minutes including physician time.  Signed, Kathyrn Drown, NP 02/11/2022, 2:28 PM [phone]   I have personally seen and examined this patient.  I agree with the assessment and plan as outlined above.  Doing well post tavr. No heart block. PVCs on tele. Will remove pacing wire. Discharge later today if stable.   Kathyrn Drown, MD, [facility] 02/11/2022 2:28 PM

## 2022-02-10 NOTE — Addendum Note (Signed)
Addendum  created 02/10/22 1449 by Effie Berkshire, MD   Clinical Note Signed, Delete clinical note

## 2022-02-10 NOTE — Anesthesia Procedure Notes (Signed)
Procedure Name: MAC Date/Time: 02/10/2022 10:19 AM  Performed by: Rande Brunt, CRNAPre-anesthesia Checklist: Patient identified, Emergency Drugs available, Suction available and Patient being monitored Patient Re-evaluated:Patient Re-evaluated prior to induction Oxygen Delivery Method: Simple face mask Preoxygenation: Pre-oxygenation with 100% oxygen Induction Type: IV induction Placement Confirmation: positive ETCO2 and CO2 detector Dental Injury: Teeth and Oropharynx as per pre-operative assessment

## 2022-02-10 NOTE — Progress Notes (Signed)
  Echocardiogram 2D Echocardiogram has been performed.  Ruben Barrera 02/10/2022, 12:06 PM

## 2022-02-10 NOTE — Addendum Note (Signed)
Addendum  created 02/10/22 1618 by Rande Brunt, CRNA   Intraprocedure Event edited

## 2022-02-10 NOTE — Anesthesia Procedure Notes (Signed)
Arterial Line Insertion Performed by: Valda Favia, CRNA, CRNA  Patient location: Pre-op. Preanesthetic checklist: patient identified, IV checked, site marked, risks and benefits discussed, surgical consent, monitors and equipment checked, pre-op evaluation, timeout performed and anesthesia consent Lidocaine 1% used for infiltration Right, radial was placed Catheter size: 20 G Hand hygiene performed , maximum sterile barriers used  and Seldinger technique used Allen's test indicative of satisfactory collateral circulation Attempts: 2 Procedure performed without using ultrasound guided technique. Following insertion, dressing applied and Biopatch. Post procedure assessment: normal  Patient tolerated the procedure well with no immediate complications.

## 2022-02-10 NOTE — H&P (Signed)
[address]       [place] [address]             [phone]                                                   Ruben Barrera Medical Record #[id number] Date of Birth: 04/26/1932   Referring: Werner Lean* Primary Care: Kathalene Frames, MD Primary Cardiologist: Werner Lean, MD   Chief Complaint:   No chief complaint on file.     History of Present Illness:    Ruben Barrera is a 88 y.o. male with a hx of HTN, HLD, Non-obstructive CAD without intervention, HFrEF,  LFLG-AS, right CVA stroke, frequent PVCs and SVT who presents for evaluation for aortic stenosis. In early 2022 had [facility] 03/28/20 with diagnosis of CAD and AS.   He has had interval 12/22 admission and eval for infective endocarditis (saw Dr. Acie Fredrickson, Johney Frame, and [place] in interim). 2023: has TEE with healed vegetations.  EF has further declined.   Has had 3 months of SOB. Not able to do long walks any more.      It looks that the episode of endocarditis is over - he has not had any fevers,chills, night sweats.       Past Medical History:  Diagnosis Date   Arthritis     Coronary artery disease     Depression     Dysrhythmia      "extra heart beat"   GERD (gastroesophageal reflux disease)     History of hiatal hernia     Hypercholesteremia     Hypertension     Lumbar spinal stenosis     RLS (restless legs syndrome)      takes ativan as needed   Stroke ([place]) 01/2020    minor - no deficits           Past Surgical History:  Procedure Laterality Date   BACK SURGERY       COLONOSCOPY       EYE SURGERY Bilateral      cataracts removed    LUMBAR LAMINECTOMY/DECOMPRESSION MICRODISCECTOMY N/A 08/28/2015    Procedure: Lumbar Four-Five decompressive lumbar laminectomy;  Surgeon: Jovita Gamma, MD;  Location: [facility];  Service: Neurosurgery;  Laterality: N/A;   REPLACEMENT TOTAL KNEE BILATERAL Bilateral 11/05/2003   RIGHT/LEFT HEART CATH AND CORONARY  ANGIOGRAPHY N/A 03/28/2020    Procedure: RIGHT/LEFT HEART CATH AND CORONARY ANGIOGRAPHY;  Surgeon: Burnell Blanks, MD;  Location: [facility];  Service: Cardiovascular;  Laterality: N/A;   TEE WITHOUT CARDIOVERSION N/A 02/28/2021    Procedure: TRANSESOPHAGEAL ECHOCARDIOGRAM (TEE);  Surgeon: Freada Bergeron, MD;  Location: [facility] ENDOSCOPY;  Service: Cardiovascular;  Laterality: N/A;   TEE WITHOUT CARDIOVERSION N/A 05/28/2021    Procedure: TRANSESOPHAGEAL ECHOCARDIOGRAM (TEE);  Surgeon: Werner Lean, MD;  Location: [facility];  Service: Cardiovascular;  Laterality: N/A;   THORACIC DISCECTOMY N/A 07/10/2020    Procedure: Spinal cord stimulator via  - Thoracic Eight-Thoracic Nine, Thoracic Nine-Thoracic Ten Laminectomy;  Surgeon: Eustace Moore, MD;  Location: [facility];  Service: Neurosurgery;  Laterality: N/A;  3C   TOE AMPUTATION Right 2010    second toe   TONSILLECTOMY               Family  History  Problem Relation Age of Onset   Stroke Mother     Hypertension Other          family history        Social History        Tobacco Use  Smoking Status Some Days   Types: Cigars  Smokeless Tobacco Never  Tobacco Comments    quit 1972- currently 1 cigar per week    Social History        Substance and Sexual Activity  Alcohol Use Yes    Comment: very occasionally beer/wine or scotch             Allergies  Allergen Reactions   Simvastatin Other (See Comments)      Leg pains     Atorvastatin Other (See Comments)      Muscle aches   Benazepril Hcl Other (See Comments)      Pt does not know reaction    Losartan Other (See Comments)      Pt does not have reaction    Pravastatin Other (See Comments)      Leg pains    Rosuvastatin Other (See Comments)      Leg pains    Spironolactone Other (See Comments)      Pt does not reaction             Current Outpatient Medications  Medication Sig Dispense Refill   citalopram (CELEXA) 20 MG tablet Take 20 mg  by mouth daily.       clopidogrel (PLAVIX) 75 MG tablet Take 1 tablet (75 mg total) by mouth daily. 30 tablet 5   ezetimibe (ZETIA) 10 MG tablet Take 1 tablet (10 mg total) by mouth every evening. 30 tablet 0   fluticasone (FLONASE) 50 MCG/ACT nasal spray Place 1 spray into both nostrils daily as needed for allergies.        furosemide (LASIX) 20 MG tablet Take 1 tablet (20 mg total) by mouth daily. Take 2 tablets daily in case of weight gain 2 to 3 lbs in 24 hrs or 5 lbs in 7 days. 30 tablet 0   gabapentin (NEURONTIN) 600 MG tablet Take 600-1,200 mg by mouth every morning.       HYDROcodone-acetaminophen (NORCO) 7.5-325 MG tablet Take 1 tablet by mouth every 8 (eight) hours as needed for moderate pain. 20 tablet 0   JARDIANCE 10 MG TABS tablet Take 10 mg by mouth daily.       LORazepam (ATIVAN) 1 MG tablet Take 1 mg by mouth at bedtime as needed for sleep.       metoprolol succinate (TOPROL-XL) 25 MG 24 hr tablet Take 0.5 tablets (12.5 mg total) by mouth daily. 15 tablet 0   MULTIPLE VITAMIN PO Take 1 tablet by mouth daily.        omeprazole (PRILOSEC) 20 MG capsule Take 20 mg by mouth daily.       pravastatin (PRAVACHOL) 20 MG tablet daily.       senna-docusate (SENOKOT-S) 8.6-50 MG tablet Take 1 tablet by mouth 2 (two) times daily.       tamsulosin (FLOMAX) 0.4 MG CAPS capsule Take 1 capsule (0.4 mg total) by mouth daily. 30 capsule 0   traZODone (DESYREL) 50 MG tablet Take 25 mg by mouth at bedtime as needed for sleep.       triamcinolone (KENALOG) 0.1 % Apply 1 application topically daily as needed (irritation).        No current facility-administered medications  for this visit.      ROS 14 point ROS reviewed and negative except as per HPI     PHYSICAL EXAMINATION: There were no vitals taken for this visit.   Gen: NAD Neuro: Alert and oriented Resp: Nonlaboured Abd: Soft, ntnd Extr: WWP   Diagnostic Studies & Laboratory data:     Recent Radiology Findings:    Imaging  Results  CT CORONARY MORPH W/CTA COR W/SCORE W/CA W/CM &/OR WO/CM   Addendum Date: 01/02/2022   ADDENDUM REPORT: 01/02/2022 14:56 CLINICAL DATA:  Aortic Valve pathology with assessment for TAVR EXAM: Cardiac TAVR CT TECHNIQUE: The patient was scanned on a Siemens Force 144 slice scanner. A 120 kV retrospective scan was triggered in the descending thoracic aorta at 111 HU's. Gantry rotation speed was 270 msecs and collimation was .9 mm. No beta blockade or nitro were given. The 3D data set was reconstructed in 5% intervals of the R-R cycle. Systolic and diastolic phases were analyzed on a dedicated work station using MPR, MIP and VRT modes. The patient received 100 cc of contrast. FINDINGS: Aortic Valve: Severely thickened aortic valve with heavy calcification and reduced excursion the planimeter valve area is 1.07 Sq cm consistent with moderate to severe aortic stenosis Number of leaflets: 3 LVOT calcification: None Annular calcification: Mild Aortic Valve Calcium Score: 2025 Presence of severe basal septal hypertrophy: no Perimembranous septal diameter: 9.6  Mm Mitral Valve: Moderate scattered mitral annular calcification Aortic Annulus Measurements- 30% Major annulus diameter: 30 mm Minor annulus diameter:25 mm Annular perimeter: 84 mm Annular area: 5.48 cm2 Aortic Root Measurements Sinotubular Junction: 28 mm Ascending Thoracic Aorta: 30 mm Aortic Arch: 29 mm Aortic atherosclerosis. Sinus of Valsalva Measurements: Right coronary cusp width: 31 mm Left coronary cusp width: 32 mm Non coronary cusp width: 32 mm Coronary Artery Height above Annulus: Left Main: 15 mm Left SoV height: 23 mm Right Coronary: 17 mm Right SoV height: 22 mm Optimum Fluoroscopic Angle for Delivery: RAO 20 CAU 14 Cusp overlap view angle: RAO 0 CRA 17 Valves for structural team consideration: 29 mm Sapien Valve (Measurements on the lower end for a 29 mm Valve, LVOT is tubular and does not narrow) Sufficient sinus measurements for a 34 mm  Evolute Valve Non TAVR Valve Findings: Coronary Arteries: Normal coronary origin. Study not completed with nitroglycerin. Coronary Calcium Score: Left main: 0 Left anterior descending artery: 578 Left circumflex artery: 552 Right coronary artery: 545 Total: 1675 Systemic veins: Normal anatomy Main Pulmonary artery: Severely dilated 35 mm Pulmonary veins: Normal anatomy Left atrial appendage: Patent Interatrial septum: No communications Left ventricle: Normal size Left atrium: Dilated Right ventricle: Normal size Right atrium: Normal size Pericardium: No calcifications Extra Cardiac Findings as per separate reporting. Notable artifacts: Slab artifact from patient motion IMPRESSION: 1. Severe Aortic stenosis. Findings pertinent to TAVR procedure are detailed above. RECOMMENDATIONS: The proposed cut-off value of 1,651 AU yielded a 93 % sensitivity and 75 % specificity in grading AS severity in patients with classical low-flow, low-gradient AS. Proposed different cut-off values to define severe AS for men and women as 2,065 AU and 1,274 AU, respectively. The joint European and American recommendations for the assessment of AS consider the aortic valve calcium score as a continuum - a very high calcium score suggests severe AS and a low calcium score suggests severe AS is unlikely. Kerman Passey, et al. 2017 ESC/EACTS Guidelines for the management of valvular heart disease. Eur Heart J [phone] Coronary artery calcium (  CAC) score is a strong predictor of incident coronary heart disease (CHD) and provides predictive information beyond traditional risk factors. CAC scoring is reasonable to use in the decision to withhold, postpone, or initiate statin therapy in intermediate-risk or selected borderline-risk asymptomatic adults (age 84-75 years and LDL-C >=70 to <190 mg/dL) who do not have diabetes or established atherosclerotic cardiovascular disease (ASCVD).* In intermediate-risk (10-year ASCVD risk  >=7.5% to <20%) adults or selected borderline-risk (10-year ASCVD risk >=5% to <7.5%) adults in whom a CAC score is measured for the purpose of making a treatment decision the following recommendations have been made: If CAC = 0, it is reasonable to withhold statin therapy and reassess in 5 to 10 years, as long as higher risk conditions are absent (diabetes mellitus, family history of premature CHD in first degree relatives (males <55 years; females <65 years), cigarette smoking, LDL >=190 mg/dL or other independent risk factors). If CAC is 1 to 99, it is reasonable to initiate statin therapy for patients >=75 years of age. If CAC is >=100 or >=75th percentile, it is reasonable to initiate statin therapy at any age. Cardiology referral should be considered for patients with CAC scores >=400 or >=75th percentile. *2018 AHA/ACC/AACVPR/AAPA/ABC/ACPM/ADA/AGS/APhA/ASPC/NLA/PCNA Guideline on the Management of Blood Cholesterol: A Report of the American College of Cardiology/American Heart Association Task Force on Clinical Practice Guidelines. J Am Coll Cardiol. 2019;73(24):3168-3209. Mahesh  Chandrasekhar Electronically Signed   By: Rudean Haskell M.D.   On: 01/02/2022 14:56    Result Date: 01/02/2022 EXAM: OVER-READ INTERPRETATION  CT CHEST The following report is a limited chest CT over-read performed by radiologist Dr. Yetta Glassman of [facility], [place] on 01/02/2022. This over-read does not include interpretation of cardiac or coronary anatomy or pathology. The cardiac TAVR interpretation by the cardiologist is attached. COMPARISON:  None Available. FINDINGS: Extracardiac findings will be described separately under dictation for contemporaneously obtained CTA chest, abdomen and pelvis. IMPRESSION: Please see separate dictation for contemporaneously obtained CTA chest, abdomen and pelvis dated 01/02/2022 for full description of relevant extracardiac findings. Electronically Signed: By: Yetta Glassman M.D. On: 01/02/2022 12:42    CT ANGIO ABDOMEN PELVIS  W &/OR WO CONTRAST   Result Date: 01/02/2022 CLINICAL DATA:  Preop evaluation for aortic valve replacement EXAM: CT ANGIOGRAPHY CHEST, ABDOMEN AND PELVIS TECHNIQUE: Non-contrast CT of the chest was initially obtained. Multidetector CT imaging through the chest, abdomen and pelvis was performed using the standard protocol during bolus administration of intravenous contrast. Multiplanar reconstructed images and MIPs were obtained and reviewed to evaluate the vascular anatomy. RADIATION DOSE REDUCTION: This exam was performed according to the departmental dose-optimization program which includes automated exposure control, adjustment of the mA and/or kV according to patient size and/or use of iterative reconstruction technique. CONTRAST:  19m OMNIPAQUE IOHEXOL 350 MG/ML SOLN COMPARISON:  None Available. FINDINGS: CTA CHEST FINDINGS Cardiovascular: Cardiomegaly. No pericardial effusion. Normal caliber thoracic aorta with moderate atherosclerotic disease. Left main and three-vessel coronary artery calcifications. Aortic valve thickening and calcifications. No suspicious filling defects of the central pulmonary arteries. Mediastinum/Nodes: Mild diffuse circumferential esophageal wall thickening. Enlarged right hilar lymph node measuring 1 5 cm in short axis on series 4, image 62 enlarged subcarinal lymph node measuring 1.1 cm in short axis on image 63. Lungs/Pleura: Central airways are patent. No consolidation, pleural effusion or pneumothorax. Bibasilar atelectasis. Solid subpleural pulmonary nodule of the right middle lobe measuring 4 mm on series 5, image 72. Musculoskeletal: No chest wall abnormality. No acute or  significant osseous findings. CTA ABDOMEN AND PELVIS FINDINGS Hepatobiliary: No focal liver abnormality is seen. No gallstones, gallbladder wall thickening, or biliary dilatation. Pancreas: Unremarkable. No pancreatic ductal dilatation or  surrounding inflammatory changes. Spleen: Normal in size without focal abnormality. Adrenals/Urinary Tract: Bilateral adrenal glands are unremarkable. No hydronephrosis or nephrolithiasis. Exophytic low-attenuation right renal lesion, compatible with a simple cysts, no further follow-up imaging is recommended. Bladder is unremarkable. Stomach/Bowel: Stomach is within normal limits. Appendix appears normal. Severe sigmoid diverticulosis. No evidence of bowel wall thickening, distention, or inflammatory changes. Vascular/lymphatic: No pathologically enlarged lymph nodes seen in the abdomen or pelvis. Normal caliber thoracic aorta with severe calcified and noncalcified plaque. Reproductive: Prostatomegaly. Other: Small fat containing left inguinal hernia. No abdominopelvic ascites. Musculoskeletal: No acute osseous findings. Posterior fusion of L4-L5. Severe degenerative changes of the lumbar spine. Partially visualized spinal stimulator device lead at the level of the lower thoracic spine. VASCULAR MEASUREMENTS PERTINENT TO TAVR: AORTA: Minimal Aortic Diameter-17.2 mm Severity of Aortic Calcification-severe RIGHT PELVIS: Right Common Iliac Artery - Minimal Diameter-8.3 mm Tortuosity-severe Calcification-moderate Right External Iliac Artery - Minimal Diameter-7.9 mm Tortuosity-moderate Calcification-none Right Common Femoral Artery - Minimal Diameter-7.8 mm Tortuosity-none Calcification-none LEFT PELVIS: Left Common Iliac Artery - Minimal Diameter-10.2 mm Tortuosity-moderate Calcification-mild Left External Iliac Artery - Minimal Diameter-7.3 mm Tortuosity-moderate Calcification-none Left Common Femoral Artery - Minimal Diameter-6.9 mm Tortuosity-none Calcification-mild Review of the MIP images confirms the above findings. IMPRESSION: Vascular: 1. Vascular findings and measurements pertinent to potential TAVR procedure, as detailed above. 2. Thickening and calcification of the aortic valve, compatible with reported  clinical history of aortic stenosis. 3. Severe aortic and moderate iliac atherosclerosis. Left main and 3 vessel coronary artery disease. Nonvascular: 1. Mild diffuse circumferential esophageal wall thickening, findings can be seen in the setting of esophagitis. 2. Enlarged right hilar and subcarinal lymph nodes, likely reactive. Recommend follow-up chest CT in 3 months to ensure resolution. 3. Solid subpleural pulmonary nodule of the right middle lobe measuring 4 mm. No follow-up needed if patient is low-risk.This recommendation follows the consensus statement: Guidelines for Management of Incidental Pulmonary Nodules Detected on CT Images: From the Fleischner Society 2017; Radiology 2017; 284:228-243. Electronically Signed   By: Yetta Glassman M.D.   On: 01/02/2022 12:41    CT ANGIO CHEST AORTA W/CM & OR WO/CM   Result Date: 01/02/2022 CLINICAL DATA:  Preop evaluation for aortic valve replacement EXAM: CT ANGIOGRAPHY CHEST, ABDOMEN AND PELVIS TECHNIQUE: Non-contrast CT of the chest was initially obtained. Multidetector CT imaging through the chest, abdomen and pelvis was performed using the standard protocol during bolus administration of intravenous contrast. Multiplanar reconstructed images and MIPs were obtained and reviewed to evaluate the vascular anatomy. RADIATION DOSE REDUCTION: This exam was performed according to the departmental dose-optimization program which includes automated exposure control, adjustment of the mA and/or kV according to patient size and/or use of iterative reconstruction technique. CONTRAST:  154m OMNIPAQUE IOHEXOL 350 MG/ML SOLN COMPARISON:  None Available. FINDINGS: CTA CHEST FINDINGS Cardiovascular: Cardiomegaly. No pericardial effusion. Normal caliber thoracic aorta with moderate atherosclerotic disease. Left main and three-vessel coronary artery calcifications. Aortic valve thickening and calcifications. No suspicious filling defects of the central pulmonary arteries.  Mediastinum/Nodes: Mild diffuse circumferential esophageal wall thickening. Enlarged right hilar lymph node measuring 1 5 cm in short axis on series 4, image 62 enlarged subcarinal lymph node measuring 1.1 cm in short axis on image 63. Lungs/Pleura: Central airways are patent. No consolidation, pleural effusion or pneumothorax. Bibasilar atelectasis. Solid subpleural pulmonary  nodule of the right middle lobe measuring 4 mm on series 5, image 72. Musculoskeletal: No chest wall abnormality. No acute or significant osseous findings. CTA ABDOMEN AND PELVIS FINDINGS Hepatobiliary: No focal liver abnormality is seen. No gallstones, gallbladder wall thickening, or biliary dilatation. Pancreas: Unremarkable. No pancreatic ductal dilatation or surrounding inflammatory changes. Spleen: Normal in size without focal abnormality. Adrenals/Urinary Tract: Bilateral adrenal glands are unremarkable. No hydronephrosis or nephrolithiasis. Exophytic low-attenuation right renal lesion, compatible with a simple cysts, no further follow-up imaging is recommended. Bladder is unremarkable. Stomach/Bowel: Stomach is within normal limits. Appendix appears normal. Severe sigmoid diverticulosis. No evidence of bowel wall thickening, distention, or inflammatory changes. Vascular/lymphatic: No pathologically enlarged lymph nodes seen in the abdomen or pelvis. Normal caliber thoracic aorta with severe calcified and noncalcified plaque. Reproductive: Prostatomegaly. Other: Small fat containing left inguinal hernia. No abdominopelvic ascites. Musculoskeletal: No acute osseous findings. Posterior fusion of L4-L5. Severe degenerative changes of the lumbar spine. Partially visualized spinal stimulator device lead at the level of the lower thoracic spine. VASCULAR MEASUREMENTS PERTINENT TO TAVR: AORTA: Minimal Aortic Diameter-17.2 mm Severity of Aortic Calcification-severe RIGHT PELVIS: Right Common Iliac Artery - Minimal Diameter-8.3 mm  Tortuosity-severe Calcification-moderate Right External Iliac Artery - Minimal Diameter-7.9 mm Tortuosity-moderate Calcification-none Right Common Femoral Artery - Minimal Diameter-7.8 mm Tortuosity-none Calcification-none LEFT PELVIS: Left Common Iliac Artery - Minimal Diameter-10.2 mm Tortuosity-moderate Calcification-mild Left External Iliac Artery - Minimal Diameter-7.3 mm Tortuosity-moderate Calcification-none Left Common Femoral Artery - Minimal Diameter-6.9 mm Tortuosity-none Calcification-mild Review of the MIP images confirms the above findings. IMPRESSION: Vascular: 1. Vascular findings and measurements pertinent to potential TAVR procedure, as detailed above. 2. Thickening and calcification of the aortic valve, compatible with reported clinical history of aortic stenosis. 3. Severe aortic and moderate iliac atherosclerosis. Left main and 3 vessel coronary artery disease. Nonvascular: 1. Mild diffuse circumferential esophageal wall thickening, findings can be seen in the setting of esophagitis. 2. Enlarged right hilar and subcarinal lymph nodes, likely reactive. Recommend follow-up chest CT in 3 months to ensure resolution. 3. Solid subpleural pulmonary nodule of the right middle lobe measuring 4 mm. No follow-up needed if patient is low-risk.This recommendation follows the consensus statement: Guidelines for Management of Incidental Pulmonary Nodules Detected on CT Images: From the Fleischner Society 2017; Radiology 2017; 284:228-243. Electronically Signed   By: Yetta Glassman M.D.   On: 01/02/2022 12:41           I have independently reviewed the above radiology studies  and reviewed the findings with the patient.    Recent Lab Findings: Recent Labs       Lab Results  Component Value Date    WBC 8.4 10/18/2021    HGB 16.3 10/18/2021    HCT 46.3 10/18/2021    PLT 281 10/18/2021    GLUCOSE 126 (H) 10/18/2021    CHOL 204 (H) 02/21/2020    TRIG 199 (H) 02/21/2020    HDL 42 02/21/2020     LDLCALC 122 (H) 02/21/2020    ALT 36 10/18/2021    AST 37 10/18/2021    NA 142 10/18/2021    K 3.8 10/18/2021    CL 101 10/18/2021    CREATININE 1.41 (H) 10/18/2021    BUN 24 (H) 10/18/2021    CO2 30 10/18/2021    TSH 0.757 02/23/2020    INR 1.2 02/24/2021    HGBA1C 5.6 02/21/2020          Assessment / Plan:   Ruben Barrera is a 88  y.o. male with a hx of HTN, HLD, Non-obstructive CAD without intervention, HFrEF,  LFLG-AS, right CVA stroke, frequent PVCs and SVT who presents for evaluation for symptomatic aortic stenosis.   Meets criteria for AV replacement STS risk of 5.5%.   Risks/benefits/alternatives discussed at length along with expected hospital course.    Valve: 26 Edwards S3 Approach: Transfemoral. Right is more tortious. Consider left sided access as 1st choice.  Bailout: yes NYHA: II  Misc procedural: Has a history of mitral endocarditis      I  spent 30 minutes with  the patient face to face in counseling and coordination of care.     Pierre Bali Citlali Gautney 01/21/2022 4:51 PM

## 2022-02-10 NOTE — CV Procedure (Signed)
[facility]  TAVR OPERATIVE NOTE   Date of Procedure:  02/10/2022  Preoperative Diagnosis: Severe Aortic Stenosis   Postoperative Diagnosis: Same   Procedure:   Transcatheter Aortic Valve Replacement - Transfemoral Approach  Edwards Sapien 3 THV (size 26 mm, model # [id number], serial # [id number])   Co-Surgeons:  Lauree Chandler, MD and Justice Rocher , MD   Anesthesiologist:  Smith Robert  Echocardiographer:  Gasper Sells  Pre-operative Echo Findings: Severe aortic stenosis Moderate left ventricular systolic dysfunction  Post-operative Echo Findings:       Trivial paravalvular leak Moderate left ventricular systolic dysfunction  BRIEF CLINICAL NOTE AND INDICATIONS FOR SURGERY  88 yo male with history of moderate CAD, non-ischemic cardiomyopathy, depression, GERD, hiatal hernia, HTN, hyperlipidemia, prior CVA, SVT, PVCs and severe aortic stenosis here today for TAVR. He has been followed by Dr. Gasper Sells for low flow/low gradient aortic stenosis. Echo 12/02/21 with LVEF=20-25%, mild MR. The aortic valve is thickened with poor leaflet mobility. Mean gradient 37 mmHg, peak gradient 62 mmHg. AVA 0.59 cm2, DI 0.13. This is consistent with severe AS. His LV function has been slowly falling over the past year. His LVEF was 40% by echo in September 2022. Cardiac cath January 2022 with moderate calcified CAD (50-60% proximal to mid LAD stenosis, 50-60% distal LAD stenosis, 50% intermediate branch stenosis, 30% proximal Circumflex stenosis).   During the course of the patient's preoperative work up they have been evaluated comprehensively by a multidisciplinary team of specialists coordinated through the [facility] in the [facility].  They have been demonstrated to suffer from symptomatic severe aortic stenosis as noted above. The patient has been counseled extensively as to the relative risks and benefits of all options for  the treatment of severe aortic stenosis including long term medical therapy, conventional surgery for aortic valve replacement, and transcatheter aortic valve replacement.  The patient has been independently evaluated by Dr. Tenny Craw with CT surgery and they are felt to be at high risk for conventional surgical aortic valve replacement. The surgeon indicated the patient would be a poor candidate for conventional surgery. Based upon review of all of the patient's preoperative diagnostic tests they are felt to be candidate for transcatheter aortic valve replacement using the transfemoral approach as an alternative to high risk conventional surgery.    Following the decision to proceed with transcatheter aortic valve replacement, a discussion has been held regarding what types of management strategies would be attempted intraoperatively in the event of life-threatening complications, including whether or not the patient would be considered a candidate for the use of cardiopulmonary bypass and/or conversion to open sternotomy for attempted surgical intervention.  The patient has been advised of a variety of complications that might develop peculiar to this approach including but not limited to risks of death, stroke, paravalvular leak, aortic dissection or other major vascular complications, aortic annulus rupture, device embolization, cardiac rupture or perforation, acute myocardial infarction, arrhythmia, heart block or bradycardia requiring permanent pacemaker placement, congestive heart failure, respiratory failure, renal failure, pneumonia, infection, other late complications related to structural valve deterioration or migration, or other complications that might ultimately cause a temporary or permanent loss of functional independence or other long term morbidity.  The patient provides full informed consent for the procedure as described and all questions were answered preoperatively.  DETAILS OF THE OPERATIVE  PROCEDURE  PREPARATION:   The patient is brought to the operating room on the above mentioned date and central  monitoring was established by the anesthesia team including placement of a radial arterial line. The patient is placed in the supine position on the operating table.  Intravenous antibiotics are administered. General anesthesia is used.   Baseline transthoracic echocardiogram was performed. The patient's chest, abdomen, both groins, and both lower extremities are prepared and draped in a sterile manner. A time out procedure is performed.  PERIPHERAL ACCESS:   Using the modified Seldinger technique, femoral arterial and venous access were obtained with placement of a 6 Fr sheath in the artery and a 7 Fr sheath in the vein on the right side using u/s guidance.  A pigtail diagnostic catheter was passed through the femoral arterial sheath under fluoroscopic guidance into the aortic root.  A temporary transvenous pacemaker catheter was passed through the femoral venous sheath under fluoroscopic guidance into the right ventricle.  The pacemaker was tested to ensure stable lead placement and pacemaker capture. Aortic root angiography was performed in order to determine the optimal angiographic angle for valve deployment. Of note, there was difficulty navigating the tortuous iliac system on the right. A 35 cm 6 French sheath was used and a Lunderquist wire was used to advance this sheath into the descending aorta.   TRANSFEMORAL ACCESS:  A micropuncture kit was used to gain access to the left femoral artery using u/s guidance. Position confirmed with angiography. Pre-closure with double ProGlide closure devices. The patient was heparinized systemically and ACT verified > 250 seconds.    A 14 Fr transfemoral E-sheath was introduced into the left femoral artery after progressively dilating over an Amplatz superstiff wire. An AL-2 catheter was used to direct a straight-tip exchange length wire across the  native aortic valve into the left ventricle. This was exchanged out for a pigtail catheter and position was confirmed in the LV apex. Simultaneous LV and Ao pressures were recorded.  The pigtail catheter was then exchanged for a Safari wire in the LV apex.   TRANSCATHETER HEART VALVE DEPLOYMENT:  An Edwards Sapien 3 THV (size 26 mm) was prepared and crimped per manufacturer's guidelines, and the proper orientation of the valve is confirmed on the [employer] delivery system. The valve was advanced through the introducer sheath using normal technique until in an appropriate position in the abdominal aorta beyond the sheath tip. The balloon was then retracted and using the fine-tuning wheel was centered on the valve. The valve was then advanced across the aortic arch using appropriate flexion of the catheter. The valve was carefully positioned across the aortic valve annulus. The Commander catheter was retracted using normal technique. Once final position of the valve has been confirmed by angiographic assessment, the valve is deployed while temporarily holding ventilation and during rapid ventricular pacing to maintain systolic blood pressure < 50 mmHg and pulse pressure < 10 mmHg. The balloon inflation is held for >3 seconds after reaching full deployment volume. Once the balloon has fully deflated the balloon is retracted into the ascending aorta and valve function is assessed using TTE. There is felt to be trivial paravalvular leak and no central aortic insufficiency.  The patient's hemodynamic recovery following valve deployment is good.  The deployment balloon and guidewire are both removed. Echo demostrated acceptable post-procedural gradients, stable mitral valve function, and trivial AI.   PROCEDURE COMPLETION:  The sheath was then removed and closure devices were completed. Protamine was administered once femoral arterial repair was complete. The pigtail catheter and femoral arterial sheath was  removed with a Mynx closure  device placed in the artery. There was evidence of high grade heart block. The pacemaker was left in place.   The patient tolerated the procedure well and is transported to the surgical intensive care in stable condition. There were no immediate intraoperative complications. All sponge instrument and needle counts are verified correct at completion of the operation.   No blood products were administered during the operation.  The patient received a total of 40 mL of intravenous contrast during the procedure.  LVEDP: 15 mmHg  Lauree Chandler MD, [facility] 02/10/2022 8:25 AM

## 2022-02-10 NOTE — Anesthesia Preprocedure Evaluation (Addendum)
Anesthesia Evaluation  Patient identified by MRN, date of birth, ID band Patient awake    Reviewed: Allergy & Precautions, NPO status , Patient's Chart, lab work & pertinent test results  Airway Mallampati: III  TM Distance: >3 FB Neck ROM: Full    Dental  (+) Teeth Intact, Dental Advisory Given   Pulmonary Current Smoker and Patient abstained from smoking.   breath sounds clear to auscultation       Cardiovascular hypertension, Pt. on home beta blockers + CAD and +CHF  + dysrhythmias Atrial Fibrillation  Rhythm:Regular Rate:Normal + Systolic murmurs Echo: 1. Aortic stenosis is now severe.   2. Left ventricular ejection fraction, by estimation, is 20 to 25%. The  left ventricle has severely decreased function. The left ventricle has no  regional wall motion abnormalities. There is mild left ventricular  hypertrophy. Left ventricular diastolic  parameters are consistent with Grade II diastolic dysfunction  (pseudonormalization).   3. Right ventricular systolic function is normal. The right ventricular  size is normal.   4. Left atrial size was moderately dilated.   5. The mitral valve is normal in structure. Mild mitral valve  regurgitation. No evidence of mitral stenosis.   6. DI - 0.13 severe. The aortic valve is normal in structure. Aortic  valve regurgitation is not visualized. Severe aortic valve stenosis.  Aortic valve area, by VTI measures 0.59 cm. Aortic valve mean gradient  measures 37.0 mmHg. Aortic valve Vmax  measures 3.94 m/s.   7. The inferior vena cava is normal in size with greater than 50%  respiratory variability, suggesting right atrial pressure of 3 mmHg.     Neuro/Psych  PSYCHIATRIC DISORDERS  Depression    CVA    GI/Hepatic Neg liver ROS, hiatal hernia,GERD  Medicated,,  Endo/Other  diabetes, Type 2, Oral Hypoglycemic Agents    Renal/GU Renal disease     Musculoskeletal  (+) Arthritis ,     Abdominal   Peds  Hematology negative hematology ROS (+)   Anesthesia Other Findings   Reproductive/Obstetrics                             Anesthesia Physical Anesthesia Plan  ASA: 4  Anesthesia Plan: MAC   Post-op Pain Management: Minimal or no pain anticipated   Induction: Intravenous  PONV Risk Score and Plan: 0 and Propofol infusion  Airway Management Planned: Natural Airway and Simple Face Mask  Additional Equipment: Arterial line  Intra-op Plan:   Post-operative Plan:   Informed Consent: I have reviewed the patients History and Physical, chart, labs and discussed the procedure including the risks, benefits and alternatives for the proposed anesthesia with the patient or authorized representative who has indicated his/her understanding and acceptance.       Plan Discussed with: CRNA  Anesthesia Plan Comments:        Anesthesia Quick Evaluation

## 2022-02-10 NOTE — Transfer of Care (Signed)
Immediate Anesthesia Transfer of Care Note  Patient: Ruben Barrera  Procedure(s) Performed: Transcatheter Aortic Valve Replacement, Transfemoral INTRAOPERATIVE TRANSTHORACIC ECHOCARDIOGRAM  Patient Location: PACU  Anesthesia Type:General  Level of Consciousness: drowsy and patient cooperative  Airway & Oxygen Therapy: Patient Spontanous Breathing and Patient connected to face mask oxygen  Post-op Assessment: Report given to RN, Post -op Vital signs reviewed and stable, and Patient moving all extremities  Post vital signs: Reviewed and stable  Last Vitals:  Vitals Value Taken Time  BP    Temp    Pulse    Resp    SpO2      Last Pain: There were no vitals filed for this visit.       Complications: There were no known notable events for this encounter.

## 2022-02-10 NOTE — Anesthesia Procedure Notes (Addendum)
Procedure Name: Intubation Date/Time: 02/10/2022 10:53 AM  Performed by: Effie Berkshire, MDPre-anesthesia Checklist: Patient identified, Patient being monitored, Timeout performed, Emergency Drugs available and Suction available Patient Re-evaluated:Patient Re-evaluated prior to induction Oxygen Delivery Method: Circle system utilized Preoxygenation: Pre-oxygenation with 100% oxygen Induction Type: IV induction Ventilation: Mask ventilation without difficulty Laryngoscope Size: Mac and 4 Grade View: Grade III Tube type: Oral Tube size: 7.5 mm Number of attempts: 1 Airway Equipment and Method: Bougie stylet Placement Confirmation: ETT inserted through vocal cords under direct vision, positive ETCO2 and breath sounds checked- equal and bilateral Secured at: 23 cm Tube secured with: Tape Dental Injury: Teeth and Oropharynx as per pre-operative assessment  Difficulty Due To: Difficult Airway- due to anterior larynx and Difficult Airway- due to limited oral opening Comments: Attempted intubation by Junie Bame CRNA with [facility] with grade 2b/III view. Tried bougie and was unsuccessful. Dr. Smith Robert with same view with MAC 4 blade. Bougie used and successful intubation with + ETCO2, chest rise and fog.

## 2022-02-10 NOTE — Anesthesia Postprocedure Evaluation (Deleted)
Anesthesia Post Note  Patient: Ruben Barrera  Procedure(s) Performed: Transcatheter Aortic Valve Replacement, Transfemoral INTRAOPERATIVE TRANSTHORACIC ECHOCARDIOGRAM     Patient location during evaluation: PACU Anesthesia Type: MAC Level of consciousness: awake and alert Pain management: pain level controlled Vital Signs Assessment: post-procedure vital signs reviewed and stable Respiratory status: spontaneous breathing, nonlabored ventilation, respiratory function stable and patient connected to nasal cannula oxygen Cardiovascular status: stable and blood pressure returned to baseline Postop Assessment: no apparent nausea or vomiting Anesthetic complications: no   There were no known notable events for this encounter.  Last Vitals:  Vitals:   02/10/22 1335 02/10/22 1340  BP: 130/70 (!) 123/111  Pulse: 76 71  Resp: 13 15  Temp: 36.7 C   SpO2: 97% 94%    Last Pain:  Vitals:   02/10/22 1254  TempSrc: Temporal  PainSc: 0-No pain                 Effie Berkshire

## 2022-02-10 NOTE — Anesthesia Postprocedure Evaluation (Signed)
Anesthesia Post Note  Patient: Ruben Barrera  Procedure(s) Performed: Transcatheter Aortic Valve Replacement, Transfemoral INTRAOPERATIVE TRANSTHORACIC ECHOCARDIOGRAM     Patient location during evaluation: PACU Anesthesia Type: General Level of consciousness: awake and alert Pain management: pain level controlled Vital Signs Assessment: post-procedure vital signs reviewed and stable Respiratory status: spontaneous breathing, nonlabored ventilation, respiratory function stable and patient connected to nasal cannula oxygen Cardiovascular status: blood pressure returned to baseline and stable Postop Assessment: no apparent nausea or vomiting Anesthetic complications: no   There were no known notable events for this encounter.  Last Vitals:  Vitals:   02/10/22 1335 02/10/22 1340  BP: 130/70 (!) 123/111  Pulse: 76 71  Resp: 13 15  Temp: 36.7 C   SpO2: 97% 94%    Last Pain:  Vitals:   02/10/22 1254  TempSrc: Temporal  PainSc: 0-No pain                 Effie Berkshire

## 2022-02-10 NOTE — Interval H&P Note (Signed)
**Note Ruben-Identified via Obfuscation** History and Physical Interval Note:  02/10/2022 7:09 AM  Ruben Barrera  has presented today for surgery, with the diagnosis of Severe Aortic Stenosis.  The various methods of treatment have been discussed with the patient and family. After consideration of risks, benefits and other options for treatment, the patient has consented to  Procedure(s): Transcatheter Aortic Valve Replacement, Transfemoral (N/A) INTRAOPERATIVE TRANSTHORACIC ECHOCARDIOGRAM (N/A) as a surgical intervention.  The patient's history has been reviewed, patient examined, no change in status, stable for surgery.  I have reviewed the patient's chart and labs.  Questions were answered to the patient's satisfaction.     Pierre Bali Ruben Barrera

## 2022-02-10 NOTE — Progress Notes (Signed)
  [place] VALVE TEAM  Patient doing well s/p TAVR. He is hemodynamically stable. Groin sites are stable. Patient with post procedure 2:1 AV block and HRs in the 30-40's. Temp wire left in place with rate at 60bpm. Good capture. ECG with NSR and known RBBB. Plan to transfer to [place] and early ambulation after bedrest completed and hopeful discharge over the next 24-48 hours.   Kathyrn Drown NP-C Structural Heart Team  Pager: [phone] Phone: [phone]

## 2022-02-10 NOTE — Discharge Instructions (Signed)

## 2022-02-11 ENCOUNTER — Other Ambulatory Visit: Payer: Self-pay

## 2022-02-11 ENCOUNTER — Emergency Department (HOSPITAL_COMMUNITY)
Admission: EM | Admit: 2022-02-11 | Discharge: 2022-02-12 | Discharge status: Against Medical Advice | Payer: PPO | Attending: Emergency Medicine | Admitting: Emergency Medicine

## 2022-02-11 ENCOUNTER — Emergency Department (HOSPITAL_COMMUNITY): Payer: PPO

## 2022-02-11 ENCOUNTER — Telehealth: Payer: Self-pay | Admitting: Home Health

## 2022-02-11 ENCOUNTER — Inpatient Hospital Stay (HOSPITAL_COMMUNITY): Payer: PPO

## 2022-02-11 ENCOUNTER — Encounter (HOSPITAL_COMMUNITY): Payer: Self-pay | Admitting: Cardiovascular Disease

## 2022-02-11 ENCOUNTER — Inpatient Hospital Stay (HOSPITAL_BASED_OUTPATIENT_CLINIC_OR_DEPARTMENT_OTHER)
Admit: 2022-02-11 | Discharge: 2022-02-11 | Disposition: A | Discharge status: Home / Self Care | Payer: PPO | Attending: Physician Assistant | Admitting: Physician Assistant

## 2022-02-11 DIAGNOSIS — I10 Essential (primary) hypertension: Secondary | ICD-10-CM | POA: Insufficient documentation

## 2022-02-11 DIAGNOSIS — Z952 Presence of prosthetic heart valve: Secondary | ICD-10-CM | POA: Diagnosis not present

## 2022-02-11 DIAGNOSIS — I130 Hypertensive heart and chronic kidney disease with heart failure and stage 1 through stage 4 chronic kidney disease, or unspecified chronic kidney disease: Secondary | ICD-10-CM | POA: Diagnosis not present

## 2022-02-11 DIAGNOSIS — I2510 Atherosclerotic heart disease of native coronary artery without angina pectoris: Secondary | ICD-10-CM | POA: Diagnosis not present

## 2022-02-11 DIAGNOSIS — Z006 Encounter for examination for normal comparison and control in clinical research program: Secondary | ICD-10-CM | POA: Diagnosis not present

## 2022-02-11 DIAGNOSIS — I6381 Other cerebral infarction due to occlusion or stenosis of small artery: Secondary | ICD-10-CM | POA: Diagnosis not present

## 2022-02-11 DIAGNOSIS — I493 Ventricular premature depolarization: Secondary | ICD-10-CM

## 2022-02-11 DIAGNOSIS — G459 Transient cerebral ischemic attack, unspecified: Secondary | ICD-10-CM | POA: Diagnosis not present

## 2022-02-11 DIAGNOSIS — I441 Atrioventricular block, second degree: Secondary | ICD-10-CM | POA: Diagnosis not present

## 2022-02-11 DIAGNOSIS — Z79899 Other long term (current) drug therapy: Secondary | ICD-10-CM | POA: Diagnosis not present

## 2022-02-11 DIAGNOSIS — H532 Diplopia: Secondary | ICD-10-CM | POA: Diagnosis not present

## 2022-02-11 DIAGNOSIS — I5022 Chronic systolic (congestive) heart failure: Secondary | ICD-10-CM | POA: Diagnosis not present

## 2022-02-11 DIAGNOSIS — I350 Nonrheumatic aortic (valve) stenosis: Secondary | ICD-10-CM | POA: Diagnosis not present

## 2022-02-11 DIAGNOSIS — H538 Other visual disturbances: Secondary | ICD-10-CM | POA: Diagnosis present

## 2022-02-11 LAB — CBC
HCT: 45 % (ref 39.0–52.0)
HCT: 45.4 % (ref 39.0–52.0)
Hemoglobin: 15.1 g/dL (ref 13.0–17.0)
Hemoglobin: 14.8 g/dL (ref 13.0–17.0)
MCH: 32.3 pg (ref 26.0–34.0)
MCH: 31.9 pg (ref 26.0–34.0)
MCHC: 33.6 g/dL (ref 30.0–36.0)
MCHC: 32.6 g/dL (ref 30.0–36.0)
MCV: 96.4 fL (ref 80.0–100.0)
MCV: 97.8 fL (ref 80.0–100.0)
Platelets: 173 10*3/uL (ref 150–400)
Platelets: 165 10*3/uL (ref 150–400)
RBC: 4.67 MIL/uL (ref 4.22–5.81)
RBC: 4.64 MIL/uL (ref 4.22–5.81)
RDW: 13.9 % (ref 11.5–15.5)
RDW: 14.1 % (ref 11.5–15.5)
WBC: 12.8 10*3/uL — ABNORMAL HIGH (ref 4.0–10.5)
WBC: 11 10*3/uL — ABNORMAL HIGH (ref 4.0–10.5)
nRBC: 0 % (ref 0.0–0.2)
nRBC: 0 % (ref 0.0–0.2)

## 2022-02-11 LAB — COMPREHENSIVE METABOLIC PANEL
ALT: 12 U/L (ref 0–44)
AST: 30 U/L (ref 15–41)
Albumin: 3.9 g/dL (ref 3.5–5.0)
Alkaline Phosphatase: 59 U/L (ref 38–126)
Anion gap: 11 (ref 5–15)
BUN: 20 mg/dL (ref 8–23)
CO2: 21 mmol/L — ABNORMAL LOW (ref 22–32)
Calcium: 8.7 mg/dL — ABNORMAL LOW (ref 8.9–10.3)
Chloride: 105 mmol/L (ref 98–111)
Creatinine, Ser: 1.57 mg/dL — ABNORMAL HIGH (ref 0.61–1.24)
GFR, Estimated: 42 mL/min — ABNORMAL LOW (ref >60)
Glucose, Bld: 159 mg/dL — ABNORMAL HIGH (ref 70–99)
Potassium: 4.2 mmol/L (ref 3.5–5.1)
Sodium: 137 mmol/L (ref 135–145)
Total Bilirubin: 0.7 mg/dL (ref 0.3–1.2)
Total Protein: 6.5 g/dL (ref 6.5–8.1)

## 2022-02-11 LAB — I-STAT CHEM 8, ED
BUN: 23 mg/dL (ref 8–23)
Calcium, Ion: 0.94 mmol/L — ABNORMAL LOW (ref 1.15–1.40)
Chloride: 106 mmol/L (ref 98–111)
Creatinine, Ser: 1.5 mg/dL — ABNORMAL HIGH (ref 0.61–1.24)
Glucose, Bld: 156 mg/dL — ABNORMAL HIGH (ref 70–99)
HCT: 44 % (ref 39.0–52.0)
Hemoglobin: 15 g/dL (ref 13.0–17.0)
Potassium: 4.2 mmol/L (ref 3.5–5.1)
Sodium: 137 mmol/L (ref 135–145)
TCO2: 20 mmol/L — ABNORMAL LOW (ref 22–32)

## 2022-02-11 LAB — ECHOCARDIOGRAM COMPLETE
AR max vel: 2.09 cm2
AV Area VTI: 2 cm2
AV Area mean vel: 2.02 cm2
AV Mean grad: 13 mmHg
AV Peak grad: 18.5 mmHg
Ao pk vel: 2.15 m/s
Area-P 1/2: 3.03 cm2
Calc EF: 41.2 %
Height: 70 in
S' Lateral: 5.4 cm
Single Plane A2C EF: 36.7 %
Single Plane A4C EF: 45.7 %
Weight: 2931.24 oz

## 2022-02-11 LAB — DIFFERENTIAL
Abs Immature Granulocytes: 0.05 10*3/uL (ref 0.00–0.07)
Basophils Absolute: 0 10*3/uL (ref 0.0–0.1)
Basophils Relative: 0 %
Eosinophils Absolute: 0.1 10*3/uL (ref 0.0–0.5)
Eosinophils Relative: 1 %
Immature Granulocytes: 1 %
Lymphocytes Relative: 13 %
Lymphs Abs: 1.4 10*3/uL (ref 0.7–4.0)
Monocytes Absolute: 1.2 10*3/uL — ABNORMAL HIGH (ref 0.1–1.0)
Monocytes Relative: 11 %
Neutro Abs: 8.3 10*3/uL — ABNORMAL HIGH (ref 1.7–7.7)
Neutrophils Relative %: 74 %

## 2022-02-11 LAB — PROTIME-INR
INR: 1.2 (ref 0.8–1.2)
Prothrombin Time: 14.9 seconds (ref 11.4–15.2)

## 2022-02-11 LAB — BASIC METABOLIC PANEL
Anion gap: 13 (ref 5–15)
BUN: 20 mg/dL (ref 8–23)
CO2: 15 mmol/L — ABNORMAL LOW (ref 22–32)
Calcium: 8.2 mg/dL — ABNORMAL LOW (ref 8.9–10.3)
Chloride: 108 mmol/L (ref 98–111)
Creatinine, Ser: 1.05 mg/dL (ref 0.61–1.24)
GFR, Estimated: >60 mL/min (ref >60)
Glucose, Bld: 96 mg/dL (ref 70–99)
Potassium: 5 mmol/L (ref 3.5–5.1)
Sodium: 136 mmol/L (ref 135–145)

## 2022-02-11 LAB — APTT: aPTT: 29 seconds (ref 24–36)

## 2022-02-11 LAB — MAGNESIUM: Magnesium: 2.2 mg/dL (ref 1.7–2.4)

## 2022-02-11 LAB — ETHANOL: Alcohol, Ethyl (B): <10 mg/dL (ref <10)

## 2022-02-11 MED ORDER — FUROSEMIDE 20 MG PO TABS
20.0000 mg | ORAL_TABLET | Freq: Every day | ORAL | Status: DC
  Administered 2022-02-11: 20 mg via ORAL
  Filled 2022-02-11: qty 1

## 2022-02-11 MED ORDER — PERFLUTREN LIPID MICROSPHERE
1.0000 mL | INTRAVENOUS | Status: AC | PRN
  Administered 2022-02-11: 4 mL via INTRAVENOUS

## 2022-02-11 MED ORDER — METOPROLOL SUCCINATE ER 25 MG PO TB24
12.5000 mg | ORAL_TABLET | Freq: Every day | ORAL | Status: DC
  Administered 2022-02-11: 12.5 mg via ORAL
  Filled 2022-02-11: qty 1

## 2022-02-11 MED ORDER — METOPROLOL SUCCINATE ER 25 MG PO TB24: 25.0000 mg | ORAL_TABLET | Freq: Every day | ORAL | 2 refills | Status: DC

## 2022-02-11 MED FILL — Lidocaine HCl Local Preservative Free (PF) Inj 1%: INTRAMUSCULAR | Qty: 30 | Status: AC

## 2022-02-11 NOTE — Telephone Encounter (Signed)
NA

## 2022-02-11 NOTE — Progress Notes (Signed)
Dr. Gasper Sells to read.

## 2022-02-11 NOTE — Progress Notes (Addendum)
CARDIAC REHAB PHASE I   PRE:  Rate/Rhythm: 33 SR with PVCs  BP:  Sitting: 120/71      SaO2: 94 RA  MODE:  Ambulation: 270 ft   POST:  Rate/Rhythm: 103 2nd degree HB  BP:  Sitting: 135/70      SaO2: 94 RA  Pt ambulated with RW with standby assistance. Pt denies CP and SOB, RPE 8. Pt educated on wt restriction, no baths/daily wash-ups, ex guidelines (continue HEP), heart healthy diet, and CRPII. Pt is not interested in CRPII.   Ruben Barrera  11:04 AM 02/11/2022

## 2022-02-11 NOTE — Telephone Encounter (Signed)
Patient called after-hours line states that he had been discharged from the hospital today after TAVR procedure .  He was feeling fine until 7:25 PM, experienced sudden onset of double vision while he was watching TV, symptom lasted 1-2 minutes resolved, he is currently feeling well.  Symptoms concerning for TIA.  Advised patient had to the nearest ER for immediate evaluation.  He is agreeable.

## 2022-02-11 NOTE — Progress Notes (Signed)
Echocardiogram 2D Echocardiogram has been performed.  Ruben Barrera 02/11/2022, 12:06 PM

## 2022-02-11 NOTE — ED Notes (Signed)
The pt signed out ama he would not wait for  vitals he reports that he is just too tired to wait for all that they wanted to do

## 2022-02-11 NOTE — ED Triage Notes (Addendum)
PER EMS: pt is from home with c/o 2 min episode of double vision in both eyes. No blurrydouble vision now but does report feeling dizzy. Symptom onset at [place] today. A&OX4. He had an aortic valve replacement yesterday and was d/c'd from hospital this morning.  BP- 158/70, HR-80, CBG-134, 97% RA  Hx of stroke

## 2022-02-11 NOTE — Care Management (Signed)
  Transition of Care [facility]) Screening Note   Patient Details  Name: CHERRY WITTWER Date of Birth: Aug 20, 1932   Transition of Care [facility]) CM/SW Contact:    Bethena Roys, RN Phone Number: 02/11/2022, 11:14 AM    Transition of Care Department [facility]) has reviewed the patient and no TOC needs have been identified at this time. We will continue to monitor patient advancement through interdisciplinary progression rounds. If new patient transition needs arise, please place a TOC consult.

## 2022-02-11 NOTE — Progress Notes (Signed)
Nursing staff was concerned that patient was having periods of apnea while sleeping. Placed patient on CPAP via auto-mode with oxygen set at 2lpm. Will continue to monitor patient.

## 2022-02-11 NOTE — ED Provider Notes (Signed)
[facility] EMERGENCY DEPARTMENT Provider Note   CSN: [id number] Arrival date & time: 02/11/22  2023     History {Add pertinent medical, surgical, social history, OB history to HPI:1} Chief Complaint  Patient presents with   Blurred Vision    Ruben Barrera is a 88 y.o. male.  HPI     Home Medications Prior to Admission medications   Medication Sig Start Date End Date Taking? Authorizing Provider  citalopram (CELEXA) 20 MG tablet Take 20 mg by mouth daily.    [provider]  clopidogrel (PLAVIX) 75 MG tablet Take 1 tablet (75 mg total) by mouth daily. 04/08/21   Rondel Jumbo, PA-C  ezetimibe (ZETIA) 10 MG tablet Take 1 tablet (10 mg total) by mouth every evening. 02/26/20   Angiulli, Lavon Paganini, PA-C  fluticasone (FLONASE) 50 MCG/ACT nasal spray Place 1 spray into both nostrils daily as needed for allergies.     [provider]  furosemide (LASIX) 20 MG tablet Take 1 tablet (20 mg total) by mouth daily. Take 2 tablets daily in case of weight gain 2 to 3 lbs in 24 hrs or 5 lbs in 7 days. 10/19/21 02/04/22  Arrien, Jimmy Picket, MD  gabapentin (NEURONTIN) 600 MG tablet Take 600 mg by mouth every morning. 11/23/21   [provider]  HYDROcodone-acetaminophen (NORCO) 7.5-325 MG tablet Take 1 tablet by mouth every 8 (eight) hours as needed for moderate pain. Patient taking differently: Take 1 tablet by mouth 3 (three) times daily. 02/26/20   Angiulli, Lavon Paganini, PA-C  JARDIANCE 10 MG TABS tablet Take 10 mg by mouth daily. 11/18/21   [provider]  LORazepam (ATIVAN) 1 MG tablet Take 1 mg by mouth at bedtime. 10/03/21   [provider]  meclizine (ANTIVERT) 12.5 MG tablet Take 12.5 mg by mouth 3 (three) times daily as needed for dizziness.    [provider]  metoprolol succinate (TOPROL-XL) 25 MG 24 hr tablet Take 1 tablet (25 mg total) by mouth daily. 02/12/22   Kathyrn Drown D, NP  Multiple Vitamin tablet Take 1  tablet by mouth daily.    [provider]  omeprazole (PRILOSEC) 20 MG capsule Take 20 mg by mouth daily.    [provider]  pravastatin (PRAVACHOL) 20 MG tablet Take 20 mg by mouth daily. 12/04/21   [provider]  senna-docusate (SENOKOT-S) 8.6-50 MG tablet Take 1 tablet by mouth 2 (two) times daily. Patient taking differently: Take 1 tablet by mouth daily. 02/26/20   Angiulli, Lavon Paganini, PA-C  tamsulosin (FLOMAX) 0.4 MG CAPS capsule Take 1 capsule (0.4 mg total) by mouth daily. 02/26/20   Angiulli, Lavon Paganini, PA-C  traZODone (DESYREL) 50 MG tablet Take 50 mg by mouth at bedtime. 03/07/21   [provider]  triamcinolone (KENALOG) 0.1 % Apply 1 application topically daily as needed (irritation).    [provider]      Allergies    Simvastatin, Atorvastatin, Benazepril hcl, Losartan, Pravastatin, Rosuvastatin, and Spironolactone    Review of Systems   Review of Systems  Physical Exam Updated Vital Signs BP (!) 150/106 (BP Location: Left Arm)   Pulse 81   Temp 97.9 F (36.6 C) (Oral)   Resp 20   Ht '5\' 10"'$  (1.778 m)   Wt 78.5 kg   SpO2 96%   BMI 24.82 kg/m  Physical Exam  ED Results / Procedures / Treatments   Labs (all labs ordered are listed, but only abnormal  results are displayed) Labs Reviewed  CBC - Abnormal; Notable for the following components:      Result Value   WBC 11.0 (*)    All other components within normal limits  DIFFERENTIAL - Abnormal; Notable for the following components:   Neutro Abs 8.3 (*)    Monocytes Absolute 1.2 (*)    All other components within normal limits  COMPREHENSIVE METABOLIC PANEL - Abnormal; Notable for the following components:   CO2 21 (*)    Glucose, Bld 159 (*)    Creatinine, Ser 1.57 (*)    Calcium 8.7 (*)    GFR, Estimated 42 (*)    All other components within normal limits  I-STAT CHEM 8, ED - Abnormal; Notable for the following components:   Creatinine, Ser 1.50 (*)    Glucose,  Bld 156 (*)    Calcium, Ion 0.94 (*)    TCO2 20 (*)    All other components within normal limits  ETHANOL  PROTIME-INR  APTT  RAPID URINE DRUG SCREEN, HOSP PERFORMED  URINALYSIS, ROUTINE W REFLEX MICROSCOPIC    EKG EKG Interpretation  Date/Time:  Wednesday February 11 2022 20:40:43 EST Ventricular Rate:  78 PR Interval:  142 QRS Duration: 158 QT Interval:  450 QTC Calculation: 513 R Axis:   -84 Text Interpretation: Sinus rhythm with occasional Premature ventricular complexes Right bundle branch block Left anterior fascicular block *** Bifascicular block *** Septal infarct , age undetermined Abnormal ECG Confirmed by Ripley Fraise [id number]) on 02/11/2022 11:08:46 PM  Radiology CT HEAD WO CONTRAST  Result Date: 02/11/2022 CLINICAL DATA:  Transient ischemic attack (TIA) Blurry vision. EXAM: CT HEAD WITHOUT CONTRAST TECHNIQUE: Contiguous axial images were obtained from the base of the skull through the vertex without intravenous contrast. RADIATION DOSE REDUCTION: This exam was performed according to the departmental dose-optimization program which includes automated exposure control, adjustment of the mA and/or kV according to patient size and/or use of iterative reconstruction technique. COMPARISON:  02/21/2020 FINDINGS: Brain: No intracranial hemorrhage, mass effect, or midline shift. Stable degree of atrophy and chronic small vessel ischemic change. No hydrocephalus. The basilar cisterns are patent. Right cerebellar peduncle encephalomalacia at site of prior acute infarct. Tiny remote lacunar infarct in the right cerebellum. No evidence of acute ischemia. No extra-axial or intracranial fluid collection. Vascular: Atherosclerosis of skullbase vasculature without hyperdense vessel or abnormal calcification. Skull: No fracture or focal lesion. Sinuses/Orbits: Minor mucosal thickening of the paranasal sinuses. No sinus fluid level. No mastoid effusion. Bilateral cataract resection. Other:  None. IMPRESSION: 1. No acute intracranial abnormality. 2. Stable atrophy and chronic small vessel ischemic change. 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 Electronically Signed   By: Keith Rake M.D.   On: 02/11/2022 21:26   ECHOCARDIOGRAM COMPLETE  Result Date: 02/11/2022    ECHOCARDIOGRAM REPORT   Patient Name:   Ruben Barrera Date of Exam: 02/11/2022 Medical Rec #:  [id number]      Height:       70.0 in Accession #:    [id number]     Weight:       183.2 lb Date of Birth:  12-26-1932       BSA:          2.011 m Patient Age:    42 years       BP:           135/78 mmHg Patient Gender: M              HR:  77 bpm. Exam Location:  Inpatient Procedure: 2D Echo, Cardiac Doppler, Color Doppler and Intracardiac            Opacification Agent Indications:    Post TAVR evaluation V43.3 / Z95.2  History:        Patient has prior history of Echocardiogram examinations, most                 recent 02/10/2022. CAD, Stroke; Risk Factors:Hypertension and                 GERD.                 Aortic Valve: 26 mm Sapien prosthetic, stented (TAVR) valve is                 present in the aortic position. Procedure Date: 02/10/22.  Sonographer:    Bernadene Person RDCS Referring Phys: [phone] JILL D MCDANIEL  Sonographer Comments: Image acquisition challenging due to uncooperative patient. IMPRESSIONS  1. Left ventricular ejection fraction, by estimation, is 25 to 30%. The left ventricle has severely decreased function. The left ventricle demonstrates global hypokinesis. The left ventricular internal cavity size was severely dilated. Left ventricular diastolic parameters are consistent with Grade II diastolic dysfunction (pseudonormalization).  2. Right ventricular systolic function is hyperdynamic. The right ventricular size is normal. There is normal pulmonary artery systolic pressure.  3. Left atrial size was severely dilated.  4. Right atrial size was mildly dilated.  5. The mitral valve is  grossly normal. Mild mitral valve regurgitation. No evidence of mitral stenosis.  6. The aortic valve has been replaced. Aortic valve regurgitation is trivial, paravalvular and seen in the PLAX view.. There is a 26 mm Sapien prosthetic (TAVR) valve present in the aortic position. Procedure Date: 02/10/22. Erfective orifice area, by VTI measures 2.00 cm. Aortic valve mean gradient measures 13.0 mmHg. Peak gradient 22 m Hg. DVI 0.41. Acceleration time 88 ms. Comparison(s): Stable gradients. Slight PVL despite extra CC given at deployment. Increased gradient despites low EF like reflects mild patient prosthesis mismatch. FINDINGS  Left Ventricle: Left ventricular ejection fraction, by estimation, is 25 to 30%. The left ventricle has severely decreased function. The left ventricle demonstrates global hypokinesis. Definity contrast agent was given IV to delineate the left ventricular endocardial borders. The left ventricular internal cavity size was severely dilated. There is borderline left ventricular hypertrophy. Left ventricular diastolic parameters are consistent with Grade II diastolic dysfunction (pseudonormalization). Right Ventricle: The right ventricular size is normal. No increase in right ventricular wall thickness. Right ventricular systolic function is hyperdynamic. There is normal pulmonary artery systolic pressure. The tricuspid regurgitant velocity is 2.43 m/s, and with an assumed right atrial pressure of 3 mmHg, the estimated right ventricular systolic pressure is 06.2 mmHg. Left Atrium: Left atrial size was severely dilated. Right Atrium: Right atrial size was mildly dilated. Pericardium: Trivial pericardial effusion is present. The pericardial effusion is posterior and lateral to the left ventricle. Mitral Valve: The mitral valve is grossly normal. Mild mitral valve regurgitation. No evidence of mitral valve stenosis. Tricuspid Valve: The tricuspid valve is normal in structure. Tricuspid valve  regurgitation is mild . No evidence of tricuspid stenosis. Aortic Valve: The aortic valve has been repaired/replaced. Aortic valve regurgitation is trivial. Aortic valve mean gradient measures 13.0 mmHg. Aortic valve peak gradient measures 18.5 mmHg. Aortic valve area, by VTI measures 2.00 cm. There is a 26 mm Sapien prosthetic, stented (TAVR) valve present in the aortic position. Procedure  Date: 02/10/22. Pulmonic Valve: The pulmonic valve was normal in structure. Pulmonic valve regurgitation is trivial. No evidence of pulmonic stenosis. Aorta: The aortic root and ascending aorta are structurally normal, with no evidence of dilitation. IAS/Shunts: No atrial level shunt detected by color flow Doppler.  LEFT VENTRICLE PLAX 2D LVIDd:         6.90 cm      Diastology LVIDs:         5.40 cm      LV e' medial:    3.77 cm/s LV PW:         0.90 cm      LV E/e' medial:  20.3 LV IVS:        0.80 cm      LV e' lateral:   11.60 cm/s LVOT diam:     2.50 cm      LV E/e' lateral: 6.6 LV SV:         84 LV SV Index:   42 LVOT Area:     4.91 cm  LV Volumes (MOD) LV vol d, MOD A2C: 169.0 ml LV vol d, MOD A4C: 184.0 ml LV vol s, MOD A2C: 107.0 ml LV vol s, MOD A4C: 100.0 ml LV SV MOD A2C:     62.0 ml LV SV MOD A4C:     184.0 ml LV SV MOD BP:      72.7 ml RIGHT VENTRICLE RV S prime:     11.30 cm/s TAPSE (M-mode): 2.0 cm LEFT ATRIUM              Index        RIGHT ATRIUM           Index LA diam:        5.80 cm  2.88 cm/m   RA Area:     13.40 cm LA Vol (A2C):   99.3 ml  49.38 ml/m  RA Volume:   27.40 ml  13.63 ml/m LA Vol (A4C):   110.0 ml 54.70 ml/m LA Biplane Vol: 105.0 ml 52.21 ml/m  AORTIC VALVE AV Area (Vmax):    2.09 cm AV Area (Vmean):   2.02 cm AV Area (VTI):     2.00 cm AV Vmax:           215.00 cm/s AV Vmean:          149.667 cm/s AV VTI:            0.420 m AV Peak Grad:      18.5 mmHg AV Mean Grad:      13.0 mmHg LVOT Vmax:         91.53 cm/s LVOT Vmean:        61.733 cm/s LVOT VTI:          0.171 m LVOT/AV VTI  ratio: 0.41  AORTA Ao Root diam: 2.90 cm Ao Asc diam:  3.20 cm MITRAL VALVE               TRICUSPID VALVE MV Area (PHT): 3.03 cm    TR Peak grad:   23.6 mmHg MV Decel Time: 250 msec    TR Vmax:        243.00 cm/s MV E velocity: 76.40 cm/s MV A velocity: 45.60 cm/s  SHUNTS MV E/A ratio:  1.68        Systemic VTI:  0.17 m  Systemic Diam: 2.50 cm Rudean Haskell MD Electronically signed by Rudean Haskell MD Signature Date/Time: 02/11/2022/1:01:50 PM    Final    ECHOCARDIOGRAM LIMITED  Result Date: 02/10/2022    ECHOCARDIOGRAM LIMITED REPORT   Patient Name:   Ruben Barrera Date of Exam: 02/10/2022 Medical Rec #:  [id number]      Height:       70.0 in Accession #:    [id number]     Weight:       173.0 lb Date of Birth:  03-Sep-1932       BSA:          1.963 m Patient Age:    84 years       BP:           141/100 mmHg Patient Gender: M              HR:           94 bpm. Exam Location:  Inpatient Procedure: Limited Echo, Cardiac Doppler and Color Doppler Indications:    I35.0 Nonrheumatic aortic (valve) stenosis  History:        Patient has prior history of Echocardiogram examinations, most                 recent 12/02/2021. CAD, Abnormal ECG, Aortic Valve Disease,                 Arrythmias:SVT; Risk Factors:Dyslipidemia and Current Smoker.                 Severe aortic stenosis.                 Aortic Valve: 26 mm Sapien prosthetic, stented (TAVR) valve is                 present in the aortic position. Procedure Date: 02/10/2022.  Sonographer:    Roseanna Rainbow RDCS Referring Phys: Burnell Blanks  Sonographer Comments: TAVR procedure IMPRESSIONS  1. Prior to procedure. Severe aortic stenosis. No AI. DVI 0.26. Mean gradient 27 mm Hg, and Peak gradient 46 mm Hg. EOA 0.88 cm2.  2. There is a 26 mm Sapien prosthetic (TAVR) valve present in the aortic position. Procedure Date: 02/10/2022. Trivial PVL at the PSAX one o'clock position (seen after wire removal). Mean gradient 2 mmHg,  Peak gradient 4 mm Hg, AVA 4.25 cm2. Normal DVI.  3. Left ventricular ejection fraction, by estimation, is 25 to 30%. The left ventricle has severely decreased function. The left ventricular internal cavity size was mildly dilated.  4. Right ventricular systolic function is low normal.  5. The mitral valve is abnormal. Moderate mitral valve regurgitation. No evidence of mitral stenosis. Comparison(s): Successfull TAVR placement. FINDINGS  Left Ventricle: Left ventricular ejection fraction, by estimation, is 25 to 30%. The left ventricle has severely decreased function. The left ventricular internal cavity size was mildly dilated. There is no left ventricular hypertrophy. Right Ventricle: Right ventricular systolic function is low normal. Mitral Valve: The mitral valve is abnormal. Moderate mitral valve regurgitation. No evidence of mitral valve stenosis. Tricuspid Valve: The tricuspid valve is not well visualized. Tricuspid valve regurgitation is not demonstrated. No evidence of tricuspid stenosis. Aortic Valve: Aortic valve mean gradient measures 2.0 mmHg. Aortic valve peak gradient measures 4.5 mmHg. Aortic valve area, by VTI measures 4.35 cm. There is a 26 mm Sapien prosthetic, stented (TAVR) valve present in the aortic position. Procedure Date: 02/10/2022. Additional Comments: Spectral Doppler performed. Color Doppler performed.  LEFT  VENTRICLE PLAX 2D LVIDd:         6.30 cm LVIDs:         5.50 cm LV PW:         1.00 cm LV IVS:        1.30 cm LVOT diam:     2.60 cm LV SV:         94 LV SV Index:   48 LVOT Area:     5.31 cm  AORTIC VALVE AV Area (Vmax):    5.16 cm AV Area (Vmean):   5.10 cm AV Area (VTI):     4.35 cm AV Vmax:           106.00 cm/s AV Vmean:          69.400 cm/s AV VTI:            0.216 m AV Peak Grad:      4.5 mmHg AV Mean Grad:      2.0 mmHg LVOT Vmax:         103.00 cm/s LVOT Vmean:        66.600 cm/s LVOT VTI:          0.177 m LVOT/AV VTI ratio: 0.82  SHUNTS Systemic VTI:  0.18 m Systemic  Diam: 2.60 cm Rudean Haskell MD Electronically signed by Rudean Haskell MD Signature Date/Time: 02/10/2022/2:01:19 PM    Final    Structural Heart Procedure  Result Date: 02/10/2022 See surgical note for result.   Procedures Procedures  {Document cardiac monitor, telemetry assessment procedure when appropriate:1}  Medications Ordered in ED Medications - No data to display  ED Course/ Medical Decision Making/ A&P Clinical Course as of 02/11/22 2343  Wed Feb 11, 2022  2256 Spoke with Dr. Lorrin Goodell recommends MRI and MRA head and neck to assess for vertebrobasilar insufficiency. If negative, believes patient would be stable for discharge and outpatient follow up. [KH]    Clinical Course User Index [KH] Antonietta Breach, PA-C                           Medical Decision Making  ***  {Document critical care time when appropriate:1} {Document review of labs and clinical decision tools ie heart score, Chads2Vasc2 etc:1}  {Document your independent review of radiology images, and any outside records:1} {Document your discussion with family members, caretakers, and with consultants:1} {Document social determinants of health affecting pt's care:1} {Document your decision making why or why not admission, treatments were needed:1} Final Clinical Impression(s) / ED Diagnoses Final diagnoses:  Diplopia    Rx / DC Orders ED Discharge Orders     None

## 2022-02-11 NOTE — Discharge Instructions (Addendum)
You are recommended to have an MRI and MRA of the head and neck completed in the emergency department.  You have chosen to leave [facility] prior to completion of this imaging.  You verbalized understanding of the risks associated with leaving [facility] including, but not limited to, worsening disease, disability, or death. We recommend close follow up with your primary care doctor. Continue your prescribed medication. Return for new or concerning symptoms.

## 2022-02-11 NOTE — ED Provider Triage Note (Cosign Needed)
Emergency Medicine Provider Triage Evaluation Note  Ruben Barrera , a 88 y.o. male  was evaluated in triage.  Pt complains of bilateral diplopia x 1 to 2 minutes.  It is 7:25 PM.  Resolved on its own, currently asymptomatic.  States he feels like the room is spinning, denies any lateralized weakness or numbness, chest pain, shortness of breath.  Patient was just discharged from the hospital after TAVR yesterday.  Review of Systems  Per HPI  Physical Exam  BP (!) 137/103 (BP Location: Right Arm)   Pulse 86   Temp 97.9 F (36.6 C) (Oral)   Resp 16   Ht '5\' 10"'$  (1.778 m)   Wt 78.5 kg   SpO2 98%   BMI 24.82 kg/m  Gen:   Awake, no distress   Resp:  Normal effort  MSK:   Moves extremities without difficulty  Other:  Cranial nerves II through XII are grossly intact.  Normal finger-nose, no pronator drift.  Upper and lower extremity strength is symmetric bilaterally.  Sensation light touch is grossly intact to the lower extremities.  Visual fields intact, no diplopia on exam.  EOMI  Medical Decision Making  Medically screening exam initiated at 8:31 PM.  Appropriate orders placed.  [name] was informed that the remainder of the evaluation will be completed by another provider, this initial triage assessment does not replace that evaluation, and the importance of remaining in the ED until their evaluation is complete.  Possible TIA, no neurodeficits on exam.  Currently asymptomatic.  Stroke/TIA workup - not a code stroke activation.   Sherrill Raring, PA-C 02/11/22 2034

## 2022-02-12 DIAGNOSIS — I493 Ventricular premature depolarization: Secondary | ICD-10-CM | POA: Diagnosis not present

## 2022-02-12 DIAGNOSIS — Z952 Presence of prosthetic heart valve: Secondary | ICD-10-CM | POA: Diagnosis not present

## 2022-02-12 LAB — TYPE AND SCREEN
ABO/RH(D): A POS
Antibody Screen: NEGATIVE
Unit division: 0
Unit division: 0
Unit division: 0
Unit division: 0

## 2022-02-12 LAB — BPAM RBC
Blood Product Expiration Date: 202312022359
Blood Product Expiration Date: 202312022359
Blood Product Expiration Date: 202312052359
Blood Product Expiration Date: 202312082359
ISSUE DATE / TIME: 202311160926
ISSUE DATE / TIME: 202311161203
ISSUE DATE / TIME: 202311161203
ISSUE DATE / TIME: 202311161203
Unit Type and Rh: 6200
Unit Type and Rh: 6200
Unit Type and Rh: 6200
Unit Type and Rh: 6200

## 2022-02-13 ENCOUNTER — Telehealth: Payer: Self-pay | Admitting: Physician Assistant

## 2022-02-13 LAB — POCT I-STAT, CHEM 8
BUN: 21 mg/dL (ref 8–23)
BUN: 21 mg/dL (ref 8–23)
Calcium, Ion: 1.14 mmol/L — ABNORMAL LOW (ref 1.15–1.40)
Calcium, Ion: 1.16 mmol/L (ref 1.15–1.40)
Chloride: 105 mmol/L (ref 98–111)
Chloride: 104 mmol/L (ref 98–111)
Creatinine, Ser: 1 mg/dL (ref 0.61–1.24)
Creatinine, Ser: 1 mg/dL (ref 0.61–1.24)
Glucose, Bld: 107 mg/dL — ABNORMAL HIGH (ref 70–99)
Glucose, Bld: 132 mg/dL — ABNORMAL HIGH (ref 70–99)
HCT: 48 % (ref 39.0–52.0)
HCT: 45 % (ref 39.0–52.0)
Hemoglobin: 16.3 g/dL (ref 13.0–17.0)
Hemoglobin: 15.3 g/dL (ref 13.0–17.0)
Potassium: 3.9 mmol/L (ref 3.5–5.1)
Potassium: 4.5 mmol/L (ref 3.5–5.1)
Sodium: 140 mmol/L (ref 135–145)
Sodium: 138 mmol/L (ref 135–145)
TCO2: 22 mmol/L (ref 22–32)
TCO2: 27 mmol/L (ref 22–32)

## 2022-02-13 LAB — POCT I-STAT 7, (LYTES, BLD GAS, ICA,H+H)
Acid-Base Excess: 1 mmol/L (ref 0.0–2.0)
Bicarbonate: 22.3 mmol/L (ref 20.0–28.0)
Calcium, Ion: 1.14 mmol/L — ABNORMAL LOW (ref 1.15–1.40)
HCT: 46 % (ref 39.0–52.0)
Hemoglobin: 15.6 g/dL (ref 13.0–17.0)
O2 Saturation: 99 %
Potassium: 3.9 mmol/L (ref 3.5–5.1)
Sodium: 140 mmol/L (ref 135–145)
TCO2: 23 mmol/L (ref 22–32)
pCO2 arterial: 28.2 mmHg — ABNORMAL LOW (ref 32–48)
pH, Arterial: 7.506 — ABNORMAL HIGH (ref 7.35–7.45)
pO2, Arterial: 140 mmHg — ABNORMAL HIGH (ref 83–108)

## 2022-02-13 NOTE — Telephone Encounter (Signed)
  [facility] VALVE TEAM   Patient contacted regarding discharge from [facility] on 11/22  Patient understands to follow up with a structural heart APP on 11/29 at [facility].  Patient understands discharge instructions? yes Patient understands medications and regimen? yes Patient understands to bring all medications to this visit? Yes  Had an episode of double vision and went to ER. CT scan was reassuring. Has a history of vertigo that feels similar.    Angelena Form PA-C  [facility]

## 2022-02-16 NOTE — Op Note (Addendum)
OPERATIVE NOTE: Patient Name: Ruben Barrera Date of Birth: [birth year] Date of Operation: 02/10/22  PRE-OPERATIVE DIAGNOSIS: Severe, Symptomatic Aortic Stenosis  POST-OPERATIVE DIAGNOSIS: Same  OPERATION: Transfemoral Sapien S3 38m Aortic Valve Placement  SURGEON: DPierre BaliEnter MD   Co-Surgeon: MBurnell Blanks MD   EBL 100cc  FINDINGS: Good implant depth No significant AI   SPECIMENS None   COMPLICATIONS: None  TUBES:  None   PROCEDURE IN DETAIL: The patient was brought in the operating room and laid in supine position.  The patient was prepped and draped in standard fashion. Arterial and venous lines were placed by anesthesia along under sedation. Right and left transfemoral access was gained with ultrasound guidance. The venous access was used to advance a temporary pacer into the right heart.   On the right femoral artery, a 6Fr sheath was placed and wire and pigtail through this into the noncoronary cusp.    We then used micropuncture techniques and ultrasound to approach the left groin and gained access and placed 2 Perclose devices. Via an 8 Fr sheath an Amplatz wire was placed and then exchanged for a 14 Fr E-sheath, then an AL caltheter was used with flexible straight tip to get into the LV. No BAV performed. Then exchanged over a pigtail for a Safari wire. We advanced the Edwards 26 mm S3 into the aortic annulus and then deployed while pacing. Implant depth was excellent. Hemodynamics were good. We then removed the sheath and deployed perclose x 2. Protamine was administered.   Sheaths were removed and closure devices had been completed. I defer to Dr. MAngelena Formregarding the Mynx closure of artery. TPM was left in place as there was some high grade heart block.    The patient had a stable status and was transferred to the postoperative care unit in stable condition. All surgical counts were correct.

## 2022-02-17 ENCOUNTER — Encounter: Payer: Self-pay | Admitting: Internal Medicine

## 2022-02-17 ENCOUNTER — Telehealth: Payer: Self-pay | Admitting: Cardiology

## 2022-02-17 DIAGNOSIS — I5022 Chronic systolic (congestive) heart failure: Secondary | ICD-10-CM | POA: Diagnosis not present

## 2022-02-17 DIAGNOSIS — E7800 Pure hypercholesterolemia, unspecified: Secondary | ICD-10-CM | POA: Diagnosis not present

## 2022-02-17 DIAGNOSIS — I10 Essential (primary) hypertension: Secondary | ICD-10-CM | POA: Diagnosis not present

## 2022-02-17 DIAGNOSIS — F322 Major depressive disorder, single episode, severe without psychotic features: Secondary | ICD-10-CM | POA: Diagnosis not present

## 2022-02-17 DIAGNOSIS — I5023 Acute on chronic systolic (congestive) heart failure: Secondary | ICD-10-CM | POA: Diagnosis not present

## 2022-02-17 NOTE — Telephone Encounter (Signed)
Overnight fellow informed me that [employer] called overnight reporting that this patient had an episode of atrial fibrillation. Episode occurred at 0059 and lasted 90 seconds. HR 94-170. Since then, patient has reportedly been in and out of afib.   Patient had TAVR on 02/10/2022. He is on plavix. Has an appointment with Structural heart APP tomorrow. I have sent the structural heart APPs a message asking them to follow up on afib/address AC at that appointment.   Margie Billet, PA-C 02/17/2022 7:24 AM

## 2022-02-17 NOTE — Progress Notes (Unsigned)
HEART AND [place]                                     Cardiology Office Note:    Date:  02/19/2022   ID:  Ruben Barrera, DOB 24-Nov-1932, MRN [id number]  PCP:  Ruben Frames, MD  [facility] Cardiologist:  Werner Lean, MD  / Dr. Angelena Form, MD & Dr. Tenny Craw, MD (TAVR)  [place] Electrophysiologist:  None   Referring MD: Ruben Barrera, *   TOC s/p TAVR  History of Present Illness:    Ruben Barrera is a 88 y.o. male with a history of LAFB, RBBB, moderate CAD, NICM, depression, GERD, hiatal hernia, HTN, HLD, prior CVA, SVT, PVCs, mitral endocarditis 02/2021, progressive LV dysfunction and severe LFLG aortic stenosis s/p TAVR (02/10/22) who presents clinic for follow up.     Ruben Barrera is followed by Dr. Gasper Sells for his cardiology care. He was found to have low flow/low gradient aortic stenosis in early 2022. Echocardiogram 12/02/21 showed an LVEF at 20-25% with mild MR, aortic valve is thickening with poor leaflet mobility with a mean gradient 37 mmHg, peak gradient 62 mmHg, AVA 0.59 cm2, and DI 0.13 consistent with severe AS. His LV function had been slowly falling over the past year despite GDMT. [facility] 03/2020 showed moderate calcified CAD (50-60% proximal to mid LAD stenosis, 50-60% distal LAD stenosis, 50% intermediate branch stenosis, 30% proximal Circumflex stenosis) with continued medical management recommended. He saw Dr. Gasper Sells 12/08/21 at which time he had no good explanation for his continued drop in EF and he was referred to the structural heart team for further evaluation of his aortic stenosis.    The patient was evaluated by the multidisciplinary valve team and underwent successful TAVR with a 26 mm Edwards Sapien 3 Ultra Resilia  THV via the TF approach on 02/10/22. Post op echo showed EF 25%, normally functioning TAVR with a mean gradient of 13 mmHg and trivial PVL. He has transient HAVB  that resolved. Noted to have a lot of PVCs. He was discharged on POD1 on his home Plavix and with a live Zio AT monitor.   The following day he returned to the ER with an episode of double vision. CT scan was reassuring. Pt declined MRI because it would take too long and he felt like symptomat were reminiscent of previous vertigo.  Zio AT alerted our office of an episode of atrial fibrillation. Episode occurred at 0059 and lasted 90 seconds. HR 94-170. Since then, patient has reportedly been in and out of afib.   Today the patient presents to clinic for follow up. Here with wife. Rode bike 5 miles and has as little more stamina but otherwise doesn't notice any big differences. No CP or SOB. No LE edema, orthopnea or PND. No dizziness or syncope. No blood in stool or urine. No palpitations.    Past Medical History:  Diagnosis Date   Arthritis    Coronary artery disease    Depression    Dysrhythmia    "extra heart beat"   GERD (gastroesophageal reflux disease)    History of hiatal hernia    Hypercholesteremia    Hypertension    Lumbar spinal stenosis    RLS (restless legs syndrome)    takes ativan as needed   S/P TAVR (transcatheter aortic valve replacement) 02/10/2022  66m S3UR via TF approach with Dr. MAngelena Formand Dr. ETenny Craw  Stroke ([facility] 01/2020   minor - no deficits    Past Surgical History:  Procedure Laterality Date   BACK SURGERY     COLONOSCOPY     EYE SURGERY Bilateral    cataracts removed    INTRAOPERATIVE TRANSTHORACIC ECHOCARDIOGRAM N/A 02/10/2022   Procedure: INTRAOPERATIVE TRANSTHORACIC ECHOCARDIOGRAM;  Surgeon: MBurnell Blanks MD;  [place];  Service: Open Heart Surgery;  Laterality: N/A;   LUMBAR LAMINECTOMY/DECOMPRESSION MICRODISCECTOMY N/A 08/28/2015   Procedure: Lumbar Four-Five decompressive lumbar laminectomy;  Surgeon: RJovita Gamma MD;  [facility];  Service: Neurosurgery;  Laterality: N/A;   REPLACEMENT TOTAL KNEE  BILATERAL Bilateral 11/05/2003   RIGHT/LEFT HEART CATH AND CORONARY ANGIOGRAPHY N/A 03/28/2020   Procedure: RIGHT/LEFT HEART CATH AND CORONARY ANGIOGRAPHY;  Surgeon: MBurnell Blanks MD;  [place] [facility];  Service: Cardiovascular;  Laterality: N/A;   TEE WITHOUT CARDIOVERSION N/A 02/28/2021   Procedure: TRANSESOPHAGEAL ECHOCARDIOGRAM (TEE);  Surgeon: PFreada Bergeron MD;  [facility];  Service: Cardiovascular;  Laterality: N/A;   TEE WITHOUT CARDIOVERSION N/A 05/28/2021   Procedure: TRANSESOPHAGEAL ECHOCARDIOGRAM (TEE);  Surgeon: CWerner Lean MD;  [facility];  Service: Cardiovascular;  Laterality: N/A;   THORACIC DISCECTOMY N/A 07/10/2020   Procedure: Spinal cord stimulator via  - Thoracic Eight-Thoracic Nine, Thoracic Nine-Thoracic Ten Laminectomy;  Surgeon: JEustace Moore MD;  [place]  Service: Neurosurgery;  Laterality: N/A;  3C   TOE AMPUTATION Right 2010   second toe   TONSILLECTOMY     TRANSCATHETER AORTIC VALVE REPLACEMENT, TRANSFEMORAL N/A 02/10/2022   Procedure: Transcatheter Aortic Valve Replacement, Transfemoral;  Surgeon: MBurnell Blanks MD;  [facility];  Service: Open Heart Surgery;  Laterality: N/A;    Current Medications: Current Meds  Medication Sig   apixaban (ELIQUIS) 5 MG TABS tablet Take 1 tablet (5 mg total) by mouth 2 (two) times daily.   citalopram (CELEXA) 20 MG tablet Take 20 mg by mouth daily.   ezetimibe (ZETIA) 10 MG tablet Take 1 tablet (10 mg total) by mouth every evening.   fluticasone (FLONASE) 50 MCG/ACT nasal spray Place 1 spray into both nostrils daily as needed for allergies.    gabapentin (NEURONTIN) 600 MG tablet Take 600 mg by mouth every morning.   HYDROcodone-acetaminophen (NORCO) 7.5-325 MG tablet Take 1 tablet by mouth every 8 (eight) hours as needed for moderate pain. (Patient taking differently: Take 1 tablet by mouth 3 (three) times daily.)   JARDIANCE 10  MG TABS tablet Take 10 mg by mouth daily.   LORazepam (ATIVAN) 1 MG tablet Take 1 mg by mouth at bedtime.   meclizine (ANTIVERT) 12.5 MG tablet Take 12.5 mg by mouth 3 (three) times daily as needed for dizziness.   metoprolol succinate (TOPROL-XL) 25 MG 24 hr tablet Take 1 tablet (25 mg total) by mouth daily.   Multiple Vitamin tablet Take 1 tablet by mouth daily.   omeprazole (PRILOSEC) 20 MG capsule Take 20 mg by mouth daily.   pravastatin (PRAVACHOL) 20 MG tablet Take 20 mg by mouth daily.   senna-docusate (SENOKOT-S) 8.6-50 MG tablet Take 1 tablet by mouth 2 (two) times daily. (Patient taking differently: Take 1 tablet by mouth daily.)   tamsulosin (FLOMAX) 0.4 MG CAPS capsule Take 1 capsule (0.4 mg total) by mouth daily.   traZODone (DESYREL) 50 MG tablet Take 50 mg by mouth at  bedtime.   triamcinolone (KENALOG) 0.1 % Apply 1 application topically daily as needed (irritation).   [DISCONTINUED] clopidogrel (PLAVIX) 75 MG tablet Take 1 tablet (75 mg total) by mouth daily.   Current Facility-Administered Medications for the 02/18/22 encounter (Office Visit) with [facility]  Medication   triamcinolone acetonide (KENALOG) 10 MG/ML injection 10 mg     Allergies:   Simvastatin, Atorvastatin, Benazepril hcl, Losartan, Pravastatin, Rosuvastatin, and Spironolactone   Social History   Socioeconomic History   Marital status: Married    Spouse name: Not on file   Number of children: 2   Years of education: Not on file   Highest education level: Not on file  Occupational History   Occupation: Customer service manager  Tobacco Use   Smoking status: Some Days    Types: Cigars   Smokeless tobacco: Never   Tobacco comments:    quit 1972- currently 1 cigar per week  Vaping Use   Vaping Use: Never used  Substance and Sexual Activity   Alcohol use: Yes    Comment: very occasionally beer/wine or scotch   Drug use: No    Comment: 1 cigar per week   Sexual activity: Not  Currently  Other Topics Concern   Not on file  Social History Narrative   Right handed   Lives with wife one story home   Social Determinants of Health   Financial Resource Strain: Not on file  Food Insecurity: Not on file  Transportation Needs: Not on file  Physical Activity: Not on file  Stress: Not on file  Social Connections: Not on file     Family History: The patient's family history includes Hypertension in an other family member; Stroke in his mother.  ROS:   Please see the history of present illness.    All other systems reviewed and are negative.  EKGs/Labs/Other Studies Reviewed:    The following studies were reviewed today:  [facility]  TAVR OPERATIVE NOTE     Date of Procedure:                02/10/2022   Preoperative Diagnosis:      Severe Aortic Stenosis    Postoperative Diagnosis:    Same    Procedure:        Transcatheter Aortic Valve Replacement - Transfemoral Approach             Edwards Sapien 3 THV (size 26 mm, model # [id number], serial # [id number])              Co-Surgeons:                        Lauree Chandler, MD and Justice Rocher , MD    Anesthesiologist:                  Smith Robert   Echocardiographer:              Gasper Sells   Pre-operative Echo Findings: Severe aortic stenosis Moderate left ventricular systolic dysfunction   Post-operative Echo Findings:       Trivial paravalvular leak Moderate left ventricular systolic dysfunction _____________     Echo 02/11/22: IMPRESSIONS   1. Left ventricular ejection fraction, by estimation, is 25 to 30%. The  left ventricle has severely decreased function. The left ventricle  demonstrates global hypokinesis. The left ventricular internal cavity size  was severely dilated. Left ventricular  diastolic parameters are consistent with Grade II  diastolic dysfunction  (pseudonormalization).   2. Right ventricular systolic function is hyperdynamic. The right  ventricular  size is normal. There is normal pulmonary artery systolic  pressure.   3. Left atrial size was severely dilated.   4. Right atrial size was mildly dilated.   5. The mitral valve is grossly normal. Mild mitral valve regurgitation.  No evidence of mitral stenosis.   6. The aortic valve has been replaced. Aortic valve regurgitation is  trivial, paravalvular and seen in the PLAX view.. There is a 26 mm Sapien  prosthetic (TAVR) valve present in the aortic position. Procedure Date:  02/10/22. Erfective orifice area, by  VTI measures 2.00 cm. Aortic valve mean gradient measures 13.0 mmHg. Peak  gradient 22 m Hg. DVI 0.41. Acceleration time 88 ms.   Comparison(s): Stable gradients. Slight PVL despite extra CC given at  deployment. Increased gradient despites low EF like reflects mild patient  prosthesis mismatch.   EKG:  EKG is ordered today.  The ekg ordered today demonstrates sinus with bifasicular block and frequent PVCs, HR 90  Recent Labs: 10/17/2021: B Natriuretic Peptide 1,366.3 02/11/2022: ALT 12; Hemoglobin 15.0; Magnesium 2.2; Platelets 165 02/18/2022: BUN 25; Creatinine, Ser 1.18; Potassium 4.6; Sodium 144  Recent Lipid Panel    Component Value Date/Time   CHOL 204 (H) 02/21/2020 0253   TRIG 199 (H) 02/21/2020 0253   HDL 42 02/21/2020 0253   CHOLHDL 4.9 02/21/2020 0253   VLDL 40 02/21/2020 0253   LDLCALC 122 (H) 02/21/2020 0253     Risk Assessment/Calculations:    CHA2DS2-VASc Score = 7   This indicates a 11.2% annual risk of stroke. The patient's score is based upon: CHF History: 1 HTN History: 1 Diabetes History: 0 Stroke History: 2 Vascular Disease History: 1 Age Score: 2 Gender Score: 0      Physical Exam:    VS:  BP 126/80   Pulse 90   Ht '5\' 7"'$  (1.702 m)   Wt 176 lb 6.4 oz (80 kg)   SpO2 97%   BMI 27.63 kg/m     Wt Readings from Last 3 Encounters:  02/18/22 176 lb 6.4 oz (80 kg)  02/11/22 173 lb (78.5 kg)  02/11/22 183 lb 3.2 oz (83.1 kg)      GEN:  Well nourished, well developed in no acute distress HEENT: Normal NECK: No JVD LYMPHATICS: No lymphadenopathy CARDIAC: RRR, frequent ectopy.  no murmurs, rubs, gallops RESPIRATORY:  Clear to auscultation without rales, wheezing or rhonchi  ABDOMEN: Soft, non-tender, non-distended MUSCULOSKELETAL:  No edema; No deformity  SKIN: Warm and dry.  Groin sites clear without hematoma or ecchymosis  NEUROLOGIC:  Alert and oriented x 3 PSYCHIATRIC:  Normal affect   ASSESSMENT:    1. S/P TAVR (transcatheter aortic valve replacement)   2. Heart block AV second degree   3. Frequent PVCs   4. Coronary artery disease involving native coronary artery of native heart without angina pectoris   5. Lymphadenopathy   6. Pulmonary nodule    PLAN:    In order of problems listed above:  Severe AS s/p TAVR: doing well 1 week out from TAVR. ECG with no HAVB. Groin sites healing well. SBE prophylaxis discussed; I have RX'd amoxicillin. Stop plavix and start Eliquis '5mg'$  BID for new onset afib found on monitor. I will see him back for 1 month follow up and echo.    2:1 AV block: pt went into transient HAVB after TAVR. His home Toprol XL was resumed  and increased to '25mg'$  daily given frequent ectopy. Fortunately, monitor has not shown any recurrent HAVB.    PVCS: noted to have frequent PVCs and NSVT. Resumed on home Toprol XL at higher dose (12.5--> '25mg'$  daily). Will quantify PVCs on monitor.    CAD: cath 03/2020 showed moderate calcified CAD (50-60% proximal to mid LAD stenosis, 50-60% distal LAD stenosis, 50% intermediate branch stenosis, 30% proximal Circumflex stenosis). Continue medical therapy.    Lymphadenopathy: pre TAVR CTs showed enlarged right hilar and subcarinal lymph nodes, likely reactive. Recommend follow-up chest CT in 3 months to ensure resolution. This was discussed and will be set up next visit. Due 03/2022   Pulmonary nodule: pre TAVR CTs showed a solid subpleural pulmonary nodule of  the right middle lobe measuring 4 mm. No follow-up needed if patient is low-risk. Will follow on Jan 2024 CT and follow subsequent recs.   PAF: this is a new diagnosis found incidentally on Zio AT. Will wait for final report to quantify afib burden. However, he has an elevated CHADSVACS score of 7 with an 11.2% annual stroke risk, and I feel like we should initiate [place]. I had a long discussion with him about the risks and benefits and we have decided to stop plavix and start Eliquis '5mg'$  BID. Renal function is normal usually. He did have a slight bump on last check >1.5, so will repeat labs today.    Medication Adjustments/Labs and Tests Ordered: Current medicines are reviewed at length with the patient today.  Concerns regarding medicines are outlined above.  Orders Placed This Encounter  Procedures   Basic Metabolic Panel (BMET)   EKG 12-Lead   Meds ordered this encounter  Medications   apixaban (ELIQUIS) 5 MG TABS tablet    Sig: Take 1 tablet (5 mg total) by mouth 2 (two) times daily.    Dispense:  60 tablet    Refill:  11    Patient Instructions  Medication Instructions:  Your physician has recommended you make the following change in your medication:  1-STOP Plavix 2-Take Eliquis 5 mg by mouth twice daily (our office will call you with your lab work to let you know when to start)  *If you need a refill on your cardiac medications before your next appointment, please call your pharmacy*  Lab Work: Your physician recommends that you have lab work - BMET If you have labs (blood work) drawn today and your tests are completely normal, you will receive your results only by: [employer] (if you have MyChart) OR A paper copy in the mail If you have any lab test that is abnormal or we need to change your treatment, we will call you to review the results.  Testing/Procedures: None ordered today.   Follow-Up: At [facility], you and your health needs are our priority.   As part of our continuing mission to provide you with exceptional heart care, we have created designated Provider Care Teams.  These Care Teams include your primary Cardiologist (physician) and Advanced Practice Providers (APPs -  Physician Assistants and Nurse Practitioners) who all work together to provide you with the care you need, when you need it.  We recommend signing up for the patient portal called "MyChart".  Sign up information is provided on this After Visit Summary.  MyChart is used to connect with patients for Virtual Visits (Telemedicine).  Patients are able to view lab/test results, encounter notes, upcoming appointments, etc.  Non-urgent messages can be sent to your provider  as well.   To learn more about what you can do with MyChart, go to [email or web address].    Your next appointment:   Already scheduled  The format for your next appointment:   In Person  Provider:   Nell Range, PA-C     Important Information About Sugar         Signed, Angelena Form, PA-C  02/19/2022 1:24 PM    [place]

## 2022-02-18 ENCOUNTER — Ambulatory Visit: Payer: PPO | Attending: Cardiology | Admitting: Physician Assistant

## 2022-02-18 VITALS — BP 126/80 | HR 90 | Ht 67.0 in | Wt 176.4 lb

## 2022-02-18 DIAGNOSIS — I2510 Atherosclerotic heart disease of native coronary artery without angina pectoris: Secondary | ICD-10-CM | POA: Diagnosis not present

## 2022-02-18 DIAGNOSIS — Z952 Presence of prosthetic heart valve: Secondary | ICD-10-CM | POA: Diagnosis not present

## 2022-02-18 DIAGNOSIS — Z79899 Other long term (current) drug therapy: Secondary | ICD-10-CM

## 2022-02-18 DIAGNOSIS — I441 Atrioventricular block, second degree: Secondary | ICD-10-CM | POA: Diagnosis not present

## 2022-02-18 DIAGNOSIS — R911 Solitary pulmonary nodule: Secondary | ICD-10-CM | POA: Diagnosis not present

## 2022-02-18 DIAGNOSIS — I493 Ventricular premature depolarization: Secondary | ICD-10-CM | POA: Diagnosis not present

## 2022-02-18 DIAGNOSIS — R591 Generalized enlarged lymph nodes: Secondary | ICD-10-CM

## 2022-02-18 MED ORDER — APIXABAN 5 MG PO TABS: 5.0000 mg | ORAL_TABLET | Freq: Two times a day (BID) | ORAL | 11 refills | Status: AC

## 2022-02-18 NOTE — Patient Instructions (Signed)
Medication Instructions:  Your physician has recommended you make the following change in your medication:  1-STOP Plavix 2-Take Eliquis 5 mg by mouth twice daily (our office will call you with your lab work to let you know when to start)  *If you need a refill on your cardiac medications before your next appointment, please call your pharmacy*  Lab Work: Your physician recommends that you have lab work - BMET If you have labs (blood work) drawn today and your tests are completely normal, you will receive your results only by: [employer] (if you have MyChart) OR A paper copy in the mail If you have any lab test that is abnormal or we need to change your treatment, we will call you to review the results.  Testing/Procedures: None ordered today.   Follow-Up: At [facility], you and your health needs are our priority.  As part of our continuing mission to provide you with exceptional heart care, we have created designated Provider Care Teams.  These Care Teams include your primary Cardiologist (physician) and Advanced Practice Providers (APPs -  Physician Assistants and Nurse Practitioners) who all work together to provide you with the care you need, when you need it.  We recommend signing up for the patient portal called "MyChart".  Sign up information is provided on this After Visit Summary.  MyChart is used to connect with patients for Virtual Visits (Telemedicine).  Patients are able to view lab/test results, encounter notes, upcoming appointments, etc.  Non-urgent messages can be sent to your provider as well.   To learn more about what you can do with MyChart, go to [email or web address].    Your next appointment:   Already scheduled  The format for your next appointment:   In Person  Provider:   Nell Range, PA-C     Important Information About Sugar

## 2022-02-19 LAB — BASIC METABOLIC PANEL
BUN/Creatinine Ratio: 21 (ref 10–24)
BUN: 25 mg/dL (ref 8–27)
CO2: 20 mmol/L (ref 20–29)
Calcium: 9.1 mg/dL (ref 8.6–10.2)
Chloride: 105 mmol/L (ref 96–106)
Creatinine, Ser: 1.18 mg/dL (ref 0.76–1.27)
Glucose: 114 mg/dL — ABNORMAL HIGH (ref 70–99)
Potassium: 4.6 mmol/L (ref 3.5–5.2)
Sodium: 144 mmol/L (ref 134–144)
eGFR: 60 mL/min/{1.73_m2} (ref >59)

## 2022-02-26 DIAGNOSIS — I350 Nonrheumatic aortic (valve) stenosis: Secondary | ICD-10-CM | POA: Diagnosis not present

## 2022-02-26 DIAGNOSIS — I4891 Unspecified atrial fibrillation: Secondary | ICD-10-CM | POA: Diagnosis not present

## 2022-02-26 DIAGNOSIS — R42 Dizziness and giddiness: Secondary | ICD-10-CM | POA: Diagnosis not present

## 2022-02-26 DIAGNOSIS — D6869 Other thrombophilia: Secondary | ICD-10-CM | POA: Diagnosis not present

## 2022-03-02 ENCOUNTER — Other Ambulatory Visit: Payer: Self-pay | Admitting: Internal Medicine

## 2022-03-02 DIAGNOSIS — R42 Dizziness and giddiness: Secondary | ICD-10-CM

## 2022-03-09 NOTE — Addendum Note (Signed)
Encounter addended by: Markus Daft A on: 03/09/2022 4:19 PM  Actions taken: Imaging Exam ended

## 2022-03-10 ENCOUNTER — Ambulatory Visit: Payer: PPO | Attending: Internal Medicine | Admitting: Physical Therapy

## 2022-03-10 DIAGNOSIS — R42 Dizziness and giddiness: Secondary | ICD-10-CM | POA: Insufficient documentation

## 2022-03-10 DIAGNOSIS — R2681 Unsteadiness on feet: Secondary | ICD-10-CM | POA: Diagnosis not present

## 2022-03-10 DIAGNOSIS — H8112 Benign paroxysmal vertigo, left ear: Secondary | ICD-10-CM | POA: Diagnosis not present

## 2022-03-10 DIAGNOSIS — H8111 Benign paroxysmal vertigo, right ear: Secondary | ICD-10-CM | POA: Diagnosis not present

## 2022-03-10 NOTE — Patient Instructions (Signed)
Rolling    With pillow under head, start on back. Roll slowly to right. Hold position until symptoms subside. Roll slowly onto left side. Hold position until symptoms subside. Repeat sequence _15___ times per session. Do _3-5___ sessions per day.  Copyright  VHI. All rights reserved.

## 2022-03-10 NOTE — Therapy (Signed)
OUTPATIENT PHYSICAL THERAPY VESTIBULAR EVALUATION     Patient Name: MARSHAUN LORTIE MRN: [id number] DOB:June 18, 1932, 88 y.o., male Today's Date: 03/12/2022  END OF SESSION:  PT End of Session - 03/11/22 1751     Visit Number 1    Number of Visits 8    Date for PT Re-Evaluation 04/03/22    Authorization Type HTA    Authorization Time Period 03-10-22 - 04-22-22    PT Start Time 1020    PT Stop Time 1100    PT Time Calculation (min) 40 min             Past Medical History:  Diagnosis Date   Arthritis    Coronary artery disease    Depression    Dysrhythmia    "extra heart beat"   GERD (gastroesophageal reflux disease)    History of hiatal hernia    Hypercholesteremia    Hypertension    Lumbar spinal stenosis    RLS (restless legs syndrome)    takes ativan as needed   S/P TAVR (transcatheter aortic valve replacement) 02/10/2022   89m S3UR via TF approach with Dr. MAngelena Formand Dr. ETenny Craw  Stroke ([facility] 01/2020   minor - no deficits   Past Surgical History:  Procedure Laterality Date   BACK SURGERY     COLONOSCOPY     EYE SURGERY Bilateral    cataracts removed    INTRAOPERATIVE TRANSTHORACIC ECHOCARDIOGRAM N/A 02/10/2022   Procedure: INTRAOPERATIVE TRANSTHORACIC ECHOCARDIOGRAM;  Surgeon: MBurnell Blanks MD;  [facility];  Service: Open Heart Surgery;  Laterality: N/A;   LUMBAR LAMINECTOMY/DECOMPRESSION MICRODISCECTOMY N/A 08/28/2015   Procedure: Lumbar Four-Five decompressive lumbar laminectomy;  Surgeon: RJovita Gamma MD;  [place] [facility];  Service: Neurosurgery;  Laterality: N/A;   REPLACEMENT TOTAL KNEE BILATERAL Bilateral 11/05/2003   RIGHT/LEFT HEART CATH AND CORONARY ANGIOGRAPHY N/A 03/28/2020   Procedure: RIGHT/LEFT HEART CATH AND CORONARY ANGIOGRAPHY;  Surgeon: MBurnell Blanks MD;  [place] [facility];  Service: Cardiovascular;  Laterality: N/A;   TEE WITHOUT CARDIOVERSION N/A 02/28/2021   Procedure:  TRANSESOPHAGEAL ECHOCARDIOGRAM (TEE);  Surgeon: PFreada Bergeron MD;  [facility];  Service: Cardiovascular;  Laterality: N/A;   TEE WITHOUT CARDIOVERSION N/A 05/28/2021   Procedure: TRANSESOPHAGEAL ECHOCARDIOGRAM (TEE);  Surgeon: CWerner Lean MD;  [facility];  Service: Cardiovascular;  Laterality: N/A;   THORACIC DISCECTOMY N/A 07/10/2020   Procedure: Spinal cord stimulator via  - Thoracic Eight-Thoracic Nine, Thoracic Nine-Thoracic Ten Laminectomy;  Surgeon: JEustace Moore MD;  [place] [facility]  Service: Neurosurgery;  Laterality: N/A;  3C   TOE AMPUTATION Right 2010   second toe   TONSILLECTOMY     TRANSCATHETER AORTIC VALVE REPLACEMENT, TRANSFEMORAL N/A 02/10/2022   Procedure: Transcatheter Aortic Valve Replacement, Transfemoral;  Surgeon: MBurnell Blanks MD;  [place] [facility];  Service: Open Heart Surgery;  Laterality: N/A;   Patient Active Problem List   Diagnosis Date Noted   Severe aortic valve stenosis 02/10/2022   S/P TAVR (transcatheter aortic valve replacement) 02/10/2022   Blood clotting disorder ([facility] 01/26/2022   Coronary artery disease    Chronic [place] disease, stage 3a ([place]    Bacterial endocarditis    Bacteremia due to Streptococcus 02/28/2021   RLS (restless legs syndrome) 02/26/2021   S/P insertion of spinal cord stimulator 07/10/2020   Frequent PVCs 04/15/2020   HFrEF (heart failure with reduced ejection fraction) ([facility] 04/15/2020   SVT (supraventricular tachycardia) 04/15/2020  Coronary artery disease involving native coronary artery of native heart without angina pectoris 38/12/1749   Chronic systolic congestive heart failure ([place])    Mixed hyperlipidemia    Tobacco abuse    Depression    Erythrocytosis 02/20/2020   BPH (benign prostatic hyperplasia) 02/20/2020   HNP (herniated nucleus pulposus), lumbar 01/13/2016   Lumbar stenosis with neurogenic claudication 08/28/2015   Left shoulder pain  05/05/2011    PCP: Kathalene Frames, MD  REFERRING PROVIDER: Kathalene Frames, MD   REFERRING DIAG: R42 (ICD-10-CM) - Vertigo  THERAPY DIAG:  BPPV (benign paroxysmal positional vertigo), right - Plan: PT plan of care cert/re-cert  Dizziness and giddiness - Plan: PT plan of care cert/re-cert  Unsteadiness on feet - Plan: PT plan of care cert/re-cert  ONSET DATE: Referral date 02-25-22  Rationale for Evaluation and Treatment: Rehabilitation  SUBJECTIVE:   SUBJECTIVE STATEMENT: Pt states he had surgery for TAVR on 02-10-22; had some dizziness before the surgery but states the dizziness has gotten a little worse; feels it when he lies down and states his walking has been affected Pt accompanied by: self  PERTINENT HISTORY:   FLORIAN CHAUCA is a 88 y.o. male with a hx of HTN, HLD, Non-obstructive CAD without intervention, HFrEF,  LFLG-AS, right CVA stroke, frequent PVCs and SVT; s/p TAVR 02-10-22  PAIN:  Are you having pain? No  PRECAUTIONS: None  WEIGHT BEARING RESTRICTIONS: No  FALLS: Has patient fallen in last 6 months? No  LIVING ENVIRONMENT: Lives with: lives with their spouse Lives in: House/apartment  PLOF: Independent  PATIENT GOALS: resolve the vertigo  OBJECTIVE:   DIAGNOSTIC FINDINGS:   IMPRESSION: 1. No acute intracranial abnormality. 2. Stable atrophy and chronic small vessel ischemic change. 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  COGNITION: Overall cognitive status: Within functional limits for tasks assessed  Cervical ROM:  WFL's  STRENGTH: WFL's   TRANSFERS: Assistive device utilized: None  Sit to stand: Modified independence Stand to sit: Modified independence   GAIT: Gait pattern: step through pattern Distance walked: 100' Assistive device utilized: None Level of assistance: Modified independence Comments: unsteady due to dizziness   PATIENT SURVEYS:  FOTO was Np'd by front office - pt did not  complete  VESTIBULAR ASSESSMENT:  GENERAL OBSERVATION: pt is an 88 yr old gentleman with c/o vertigo that started several months ago; pt was previously seen at this facility for tx of vertigo and states he was doing rather well until a couple of months ago   SYMPTOM BEHAVIOR:  Subjective history: pt states vertigo started a couple of months ago   Non-Vestibular symptoms:  N/A  Type of dizziness: Spinning/Vertigo  Frequency: usually daily  Duration: secs to minutes  Aggravating factors: Induced by position change: lying supine, rolling to the right, rolling to the left, and supine to sit  Relieving factors: head stationary and lying supine  Progression of symptoms: worse     Positional tests: Right Roll Test: apogeotropic nystagmus Left Roll Test: no nystagmus Right Sidelying: right horizontal nystagmus Left Sidelying: right horizontal nystagmus    MOTION SENSITIVITY:  Motion Sensitivity Quotient Intensity: 0 = none, 1 = Lightheaded, 2 = Mild, 3 = Moderate, 4 = Severe, 5 = Vomiting  Intensity  1. Sitting to supine   2. Supine to L side   3. Supine to R side   4. Supine to sitting   5. L Hallpike-Dix   6. Up from L    7. R Hallpike-Dix  8. Up from R    9. Sitting, head tipped to L knee   10. Head up from L knee   11. Sitting, head tipped to R knee   12. Head up from R knee   13. Sitting head turns x5   14.Sitting head nods x5   15. In stance, 180 turn to L    16. In stance, 180 turn to R    VESTIBULAR TREATMENT:                                                                                                   DATE: 03-10-22  Canalith Repositioning:  BBQ Roll Right: Number of Reps: 2 and Response to Treatment: comment: minimal change noted on 2nd rep compared to 1st rep    PATIENT EDUCATION: Education details: eval results - Rt horizontal BPPV - cupulolithiasis; instructed pt to do repetitive rolling for self treatment Person educated: Patient Education method:  Explanation, Demonstration, and Handouts Education comprehension: verbalized understanding and returned demonstration  HOME EXERCISE PROGRAM:  GOALS: Goals reviewed with patient? Yes  LONG TERM GOALS: Target date: 04-03-22  LONG TERM GOALS: Target date:   Pt will have (-) Rt horizontal roll test to indicate resolution of Rt BPPV horizontal cupulolithiasis.  Baseline:  Goal status: INITIAL  2.  Pt will perform bed mobility with no c/o dizziness. Baseline:  Goal status: INITIAL  3.  Pt will report no vertigo with any mobility.  Baseline:  Goal status: INITIAL  4.  Independent in HEP for habituation and self treatment of BPPV. Baseline:  Goal status: INITIAL   ASSESSMENT:  CLINICAL IMPRESSION: Patient is a 88 y.o. gentleman who was seen today for physical therapy evaluation and treatment for vertigo.  Pt had (+) Rt horizontal roll test with Rt horizontal ageotropic nystagmus, indicative of Rt horizontal cupulolithiasis.  Pt was treated with 2 reps barbeque roll with no major change in dizziness on 2nd rep compared to 1st rep.  Will cont to assess and treat as indicated.   OBJECTIVE IMPAIRMENTS: decreased balance, difficulty walking, and dizziness.   ACTIVITY LIMITATIONS: bending, squatting, transfers, bed mobility, and locomotion level  PARTICIPATION LIMITATIONS: meal prep, laundry, shopping, and community activity  PERSONAL FACTORS: Age and Past/current experiences are also affecting patient's functional outcome.   REHAB POTENTIAL: Good  CLINICAL DECISION MAKING: Evolving/moderate complexity  EVALUATION COMPLEXITY: Moderate   PLAN:  PT FREQUENCY: 2x/week  PT DURATION: 4 weeks  PLANNED INTERVENTIONS: Therapeutic exercises, Therapeutic activity, Neuromuscular re-education, Balance training, Gait training, Patient/Family education, Self Care, Vestibular training, and Canalith repositioning  PLAN FOR NEXT SESSION: recheck Rt BPPV - barbeque roll   Ostin Mathey, [place], PT 03/12/2022, 8:35 PM

## 2022-03-11 ENCOUNTER — Encounter: Payer: Self-pay | Admitting: Physical Therapy

## 2022-03-12 ENCOUNTER — Encounter: Payer: Self-pay | Admitting: Physical Therapy

## 2022-03-12 ENCOUNTER — Ambulatory Visit: Payer: PPO | Admitting: Physical Therapy

## 2022-03-12 DIAGNOSIS — H8111 Benign paroxysmal vertigo, right ear: Secondary | ICD-10-CM | POA: Diagnosis not present

## 2022-03-12 DIAGNOSIS — R2681 Unsteadiness on feet: Secondary | ICD-10-CM

## 2022-03-12 NOTE — Therapy (Signed)
OUTPATIENT PHYSICAL THERAPY VESTIBULAR TREATMENT NOTE     Patient Name: Ruben Barrera MRN: [id number] DOB:[birth year], 88 y.o., male Today's Date: 03/12/2022  END OF SESSION:  PT End of Session - 03/12/22 1949     Visit Number 2    Number of Visits 8    Date for PT Re-Evaluation 04/03/22    Authorization Type HTA    Authorization Time Period 03-10-22 - 04-22-22    PT Start Time 1018    PT Stop Time 1100    PT Time Calculation (min) 42 min    Activity Tolerance Other (comment)   limited by nausea & emesis due to provocation of dizziness   Behavior During Therapy WFL for tasks assessed/performed             Past Medical History:  Diagnosis Date   Arthritis    Coronary artery disease    Depression    Dysrhythmia    "extra heart beat"   GERD (gastroesophageal reflux disease)    History of hiatal hernia    Hypercholesteremia    Hypertension    Lumbar spinal stenosis    RLS (restless legs syndrome)    takes ativan as needed   S/P TAVR (transcatheter aortic valve replacement) 02/10/2022   77m S3UR via TF approach with Dr. MAngelena Formand Dr. ETenny Craw  Stroke ([facility] 01/2020   minor - no deficits   Past Surgical History:  Procedure Laterality Date   BACK SURGERY     COLONOSCOPY     EYE SURGERY Bilateral    cataracts removed    INTRAOPERATIVE TRANSTHORACIC ECHOCARDIOGRAM N/A 02/10/2022   Procedure: INTRAOPERATIVE TRANSTHORACIC ECHOCARDIOGRAM;  Surgeon: MBurnell Blanks MD;  [place];  Service: Open Heart Surgery;  Laterality: N/A;   LUMBAR LAMINECTOMY/DECOMPRESSION MICRODISCECTOMY N/A 08/28/2015   Procedure: Lumbar Four-Five decompressive lumbar laminectomy;  Surgeon: RJovita Gamma MD;  [facility];  Service: Neurosurgery;  Laterality: N/A;   REPLACEMENT TOTAL KNEE BILATERAL Bilateral 11/05/2003   RIGHT/LEFT HEART CATH AND CORONARY ANGIOGRAPHY N/A 03/28/2020   Procedure: RIGHT/LEFT HEART CATH AND CORONARY ANGIOGRAPHY;  Surgeon:  MBurnell Blanks MD;  [facility];  Service: Cardiovascular;  Laterality: N/A;   TEE WITHOUT CARDIOVERSION N/A 02/28/2021   Procedure: TRANSESOPHAGEAL ECHOCARDIOGRAM (TEE);  Surgeon: PFreada Bergeron MD;  [facility];  Service: Cardiovascular;  Laterality: N/A;   TEE WITHOUT CARDIOVERSION N/A 05/28/2021   Procedure: TRANSESOPHAGEAL ECHOCARDIOGRAM (TEE);  Surgeon: CWerner Lean MD;  [facility];  Service: Cardiovascular;  Laterality: N/A;   THORACIC DISCECTOMY N/A 07/10/2020   Procedure: Spinal cord stimulator via  - Thoracic Eight-Thoracic Nine, Thoracic Nine-Thoracic Ten Laminectomy;  Surgeon: JEustace Moore MD;  [place]  Service: Neurosurgery;  Laterality: N/A;  3C   TOE AMPUTATION Right 2010   second toe   TONSILLECTOMY     TRANSCATHETER AORTIC VALVE REPLACEMENT, TRANSFEMORAL N/A 02/10/2022   Procedure: Transcatheter Aortic Valve Replacement, Transfemoral;  Surgeon: MBurnell Blanks MD;  [place];  Service: Open Heart Surgery;  Laterality: N/A;   Patient Active Problem List   Diagnosis Date Noted   Severe aortic valve stenosis 02/10/2022   S/P TAVR (transcatheter aortic valve replacement) 02/10/2022   [place]ting disorder ([facility] 01/26/2022   Coronary artery disease    Chronic kidney disease, stage 3a ([facility])    Bacterial endocarditis    Bacteremia due to Streptococcus 02/28/2021   RLS (restless legs syndrome) 02/26/2021  S/P insertion of spinal cord stimulator 07/10/2020   Frequent PVCs 04/15/2020   HFrEF (heart failure with reduced ejection fraction) ([place]) 04/15/2020   SVT (supraventricular tachycardia) 04/15/2020   Coronary artery disease involving native coronary artery of native heart without angina pectoris 82/95/6213   Chronic systolic congestive heart failure ([place])    Mixed hyperlipidemia    Tobacco abuse    Depression    Erythrocytosis 02/20/2020   BPH (benign prostatic  hyperplasia) 02/20/2020   HNP (herniated nucleus pulposus), lumbar 01/13/2016   Lumbar stenosis with neurogenic claudication 08/28/2015   Left shoulder pain 05/05/2011    PCP: Kathalene Frames, MD  REFERRING PROVIDER: Kathalene Frames, MD   REFERRING DIAG: R42 (ICD-10-CM) - Vertigo  THERAPY DIAG:  BPPV (benign paroxysmal positional vertigo), right  Unsteadiness on feet  ONSET DATE: Referral date 02-25-22  Rationale for Evaluation and Treatment: Rehabilitation  SUBJECTIVE:   SUBJECTIVE STATEMENT: Pt reports the vertigo is much better - states his walking was much improved yesterday but states he felt the dizziness again this morning and his walking is not as good today as it was yesterday  Pt accompanied by: self  PERTINENT HISTORY:   Ruben Barrera is a 88 y.o. male with a hx of HTN, HLD, Non-obstructive CAD without intervention, HFrEF,  LFLG-AS, right CVA stroke, frequent PVCs and SVT; s/p TAVR 02-10-22  PAIN:  Are you having pain? No  PRECAUTIONS: None  WEIGHT BEARING RESTRICTIONS: No  FALLS: Has patient fallen in last 6 months? No  LIVING ENVIRONMENT: Lives with: lives with their spouse Lives in: House/apartment  PLOF: Independent  PATIENT GOALS: resolve the vertigo  OBJECTIVE:   GAIT: Gait pattern: step through pattern Distance walked: 100' Assistive device utilized: None Level of assistance: Modified independence Comments: unsteady due to dizziness  VESTIBULAR TREATMENT:    Positional tests:    Rt Dix-Hallpike test - Rt rotary nystagmus;  Lt Dix-Hallpike test - no nystagmus  Pt was treated with 1 rep of Epley maneuver for Rt BPPV: 2nd rep was initiated and converted to Rt horizontal canal in initial position of Epley on 2nd rep  Rt roll test - Rt ageotropic nystagmus - strong intensity   Lt horizontal roll test  Barbeque roll for Rt horizontal canalithiasis 1 rep completed; pt requested to go to restroom after this maneuver was  administered - became very dizzy in restroom and lost balance but did not fall - was assisted into chair; 2nd PT, Markus Jarvis, provided supervision and assistance with balance to prevent fall due to c/o dizziness  Pt amb. From restroom to gym with min assist due to dizziness/unsteady gait  Habituation - pt performed sit to supine - turning head to Rt side- Rt ageotropic nystagmus; turning head to Lt side - Rt horizontal nystagmus;  rolling Rt to Lt side and then back to supine approx. 3 reps;  pt had nausea and emesis at end of session due to provocation of dizziness   PATIENT EDUCATION: Education details: eval results - Rt horizontal BPPV - cupulolithiasis; instructed pt to do repetitive rolling for self treatment Person educated: Patient Education method: Explanation, Demonstration, and Handouts Education comprehension: verbalized understanding and returned demonstration  HOME EXERCISE PROGRAM:  GOALS: Goals reviewed with patient? Yes  LONG TERM GOALS: Target date: 04-03-22  LONG TERM GOALS: Target date:   Pt will have (-) Rt horizontal roll test to indicate resolution of Rt BPPV horizontal cupulolithiasis.  Baseline:  Goal status: INITIAL  2.  Pt will  perform bed mobility with no c/o dizziness. Baseline:  Goal status: INITIAL  3.  Pt will report no vertigo with any mobility.  Baseline:  Goal status: INITIAL  4.  Independent in HEP for habituation and self treatment of BPPV. Baseline:  Goal status: INITIAL   ASSESSMENT:  CLINICAL IMPRESSION: Patient initially had (+) Rt Dix-Hallpike test with Rt rotary upbeating nystagmus and was treated with 1 complete rep of Epley maneuver.  Converted to Rt horizontal canalithiasis on 2nd rep of Epley maneuver.  Pt continues to have multi-canal BPPV Rt posterior and horizontal canalithiasis.  Cont to assess and treat as indicated.   OBJECTIVE IMPAIRMENTS: decreased balance, difficulty walking, and dizziness.   ACTIVITY LIMITATIONS:  bending, squatting, transfers, bed mobility, and locomotion level  PARTICIPATION LIMITATIONS: meal prep, laundry, shopping, and community activity  PERSONAL FACTORS: Age and Past/current experiences are also affecting patient's functional outcome.   REHAB POTENTIAL: Good  CLINICAL DECISION MAKING: Evolving/moderate complexity  EVALUATION COMPLEXITY: Moderate   PLAN:  PT FREQUENCY: 2x/week  PT DURATION: 4 weeks  PLANNED INTERVENTIONS: Therapeutic exercises, Therapeutic activity, Neuromuscular re-education, Balance training, Gait training, Patient/Family education, Self Care, Vestibular training, and Canalith repositioning  PLAN FOR NEXT SESSION: recheck Rt BPPV - barbeque roll   Cina Klumpp, [place], PT 03/12/2022, 7:55 PM

## 2022-03-17 ENCOUNTER — Ambulatory Visit: Payer: PPO | Admitting: Physical Therapy

## 2022-03-17 DIAGNOSIS — H8112 Benign paroxysmal vertigo, left ear: Secondary | ICD-10-CM

## 2022-03-17 DIAGNOSIS — H8111 Benign paroxysmal vertigo, right ear: Secondary | ICD-10-CM | POA: Diagnosis not present

## 2022-03-17 DIAGNOSIS — R2681 Unsteadiness on feet: Secondary | ICD-10-CM

## 2022-03-17 NOTE — Therapy (Signed)
OUTPATIENT PHYSICAL THERAPY VESTIBULAR TREATMENT NOTE     Patient Name: Ruben Barrera MRN: [id number] DOB:[birth year], 88 y.o., male Today's Date: 03/18/2022  END OF SESSION:  PT End of Session - 03/18/22 1024     Visit Number 3    Number of Visits 8    Date for PT Re-Evaluation 04/03/22    Authorization Type HTA    Authorization Time Period 03-10-22 - 04-22-22    PT Start Time 0037    PT Stop Time 1400    PT Time Calculation (min) 43 min    Activity Tolerance Patient tolerated treatment well    Behavior During Therapy WFL for tasks assessed/performed              Past Medical History:  Diagnosis Date   Arthritis    Coronary artery disease    Depression    Dysrhythmia    "extra heart beat"   GERD (gastroesophageal reflux disease)    History of hiatal hernia    Hypercholesteremia    Hypertension    Lumbar spinal stenosis    RLS (restless legs syndrome)    takes ativan as needed   S/P TAVR (transcatheter aortic valve replacement) 02/10/2022   63m S3UR via TF approach with Dr. MAngelena Formand Dr. ETenny Craw  Stroke ([facility] 01/2020   minor - no deficits   Past Surgical History:  Procedure Laterality Date   BACK SURGERY     COLONOSCOPY     EYE SURGERY Bilateral    cataracts removed    INTRAOPERATIVE TRANSTHORACIC ECHOCARDIOGRAM N/A 02/10/2022   Procedure: INTRAOPERATIVE TRANSTHORACIC ECHOCARDIOGRAM;  Surgeon: MBurnell Blanks MD;  [facility];  Service: Open Heart Surgery;  Laterality: N/A;   LUMBAR LAMINECTOMY/DECOMPRESSION MICRODISCECTOMY N/A 08/28/2015   Procedure: Lumbar Four-Five decompressive lumbar laminectomy;  Surgeon: RJovita Gamma MD;  [facility];  Service: Neurosurgery;  Laterality: N/A;   REPLACEMENT TOTAL KNEE BILATERAL Bilateral 11/05/2003   RIGHT/LEFT HEART CATH AND CORONARY ANGIOGRAPHY N/A 03/28/2020   Procedure: RIGHT/LEFT HEART CATH AND CORONARY ANGIOGRAPHY;  Surgeon: MBurnell Blanks MD;  [place] [facility];  Service: Cardiovascular;  Laterality: N/A;   TEE WITHOUT CARDIOVERSION N/A 02/28/2021   Procedure: TRANSESOPHAGEAL ECHOCARDIOGRAM (TEE);  Surgeon: PFreada Bergeron MD;  [facility];  Service: Cardiovascular;  Laterality: N/A;   TEE WITHOUT CARDIOVERSION N/A 05/28/2021   Procedure: TRANSESOPHAGEAL ECHOCARDIOGRAM (TEE);  Surgeon: CWerner Lean MD;  [facility];  Service: Cardiovascular;  Laterality: N/A;   THORACIC DISCECTOMY N/A 07/10/2020   Procedure: Spinal cord stimulator via  - Thoracic Eight-Thoracic Nine, Thoracic Nine-Thoracic Ten Laminectomy;  Surgeon: JEustace Moore MD;  [place]  Service: Neurosurgery;  Laterality: N/A;  3C   TOE AMPUTATION Right 2010   second toe   TONSILLECTOMY     TRANSCATHETER AORTIC VALVE REPLACEMENT, TRANSFEMORAL N/A 02/10/2022   Procedure: Transcatheter Aortic Valve Replacement, Transfemoral;  Surgeon: MBurnell Blanks MD;  [facility];  Service: Open Heart Surgery;  Laterality: N/A;   Patient Active Problem List   Diagnosis Date Noted   Severe aortic valve stenosis 02/10/2022   S/P TAVR (transcatheter aortic valve replacement) 02/10/2022   [place] 01/26/20[place]   Coronary artery disease    Chronic kidney disease, stage 3a (HCC)    Bacterial endocarditis    Bacteremia due to Streptococcus 02/28/2021   RLS (restless legs syndrome) 02/26/2021   S/P insertion of spinal cord stimulator 07/10/2020  Frequent PVCs 04/15/2020   HFrEF (heart failure with reduced ejection fraction) ([place]) 04/15/2020   SVT (supraventricular tachycardia) 04/15/2020   Coronary artery disease involving native coronary artery of native heart without angina pectoris 42/70/6237   Chronic systolic congestive heart failure ([place])    Mixed hyperlipidemia    Tobacco abuse    Depression    Erythrocytosis 02/20/2020   BPH (benign prostatic hyperplasia) 02/20/2020   HNP (herniated nucleus  pulposus), lumbar 01/13/2016   Lumbar stenosis with neurogenic claudication 08/28/2015   Left shoulder pain 05/05/2011    PCP: Kathalene Frames, MD  REFERRING PROVIDER: Kathalene Frames, MD   REFERRING DIAG: R42 (ICD-10-CM) - Vertigo  THERAPY DIAG:  BPPV (benign paroxysmal positional vertigo), left  Unsteadiness on feet  ONSET DATE: Referral date 02-25-22  Rationale for Evaluation and Treatment: Rehabilitation  SUBJECTIVE:   SUBJECTIVE STATEMENT: Pt reports he hasn't had much dizziness since treatment last Thursday - states the dizziness resolved later on Thursday afternoon and then did not have dizziness over the weekend nor yesterday; feels like when he walks he is unsteady Pt accompanied by: self  PERTINENT HISTORY:   Ruben Barrera is a 88 y.o. male with a hx of HTN, HLD, Non-obstructive CAD without intervention, HFrEF,  LFLG-AS, right CVA stroke, frequent PVCs and SVT; s/p TAVR 02-10-22  PAIN:  Are you having pain? No  PRECAUTIONS: None  WEIGHT BEARING RESTRICTIONS: No  FALLS: Has patient fallen in last 6 months? No  LIVING ENVIRONMENT: Lives with: lives with their spouse Lives in: House/apartment  PLOF: Independent  PATIENT GOALS: resolve the vertigo  OBJECTIVE:   GAIT: Gait pattern: step through pattern Distance walked: 100' Assistive device utilized: None Level of assistance: Modified independence Comments: unsteady due to dizziness  VESTIBULAR TREATMENT:  NeuroRe-ed:     Positional tests:            Lt Dix-Hallpike test - Lt rotary upbeating nystagmus with c/o vertigo in test position  Rt sidelying test (-) with no nystagmus and no c/o vertigo in sidelying position; Lt sidelying test (+) with Lt rotary nystagmus and c/o vertigo in sidelying position  Pt was treated with 3 reps of Epley maneuver for Lt BPPV: symptoms improved on each subsequent rep with no nystagmus and no c/o vertigo noted on 3rd rep  Gait:  pt gait trained 125'  around gym and back to treatment room without device after 2nd rep for balance assessment/improvement; pt reported improvement in balance during initial 115' of gait training - pt reported onset of feeling of unsteadiness during final 30' of this distance; 3rd rep of Epley was performed with much improvement in symptoms noted   PATIENT EDUCATION: Education details:  explained finding of Lt BPPV in today's session - pt instructed to discontinue rolling and to not do any exercise until assessment at next PT appt on Thursday, 03-19-22 Person educated: Patient Education method: Explanation, Demonstration, and Handouts Education comprehension: verbalized understanding and returned demonstration  HOME EXERCISE PROGRAM:  GOALS: Goals reviewed with patient? Yes  LONG TERM GOALS: Target date: 04-03-22  LONG TERM GOALS: Target date:   Pt will have (-) Rt horizontal roll test to indicate resolution of Rt BPPV horizontal cupulolithiasis.  Baseline:  Goal status: INITIAL  2.  Pt will perform bed mobility with no c/o dizziness. Baseline:  Goal status: INITIAL  3.  Pt will report no vertigo with any mobility.  Baseline:  Goal status: INITIAL  4.  Independent in HEP for habituation and  self treatment of BPPV. Baseline:  Goal status: INITIAL   ASSESSMENT:  CLINICAL IMPRESSION: Pt had (+) Lt Dix-Hallpike test with Lt rotary upbeating nystagmus and c/o vertigo in test position.  Pt was treated with 3 reps of Epley and had no nystagmus and no vertigo on 3rd rep - indicative of resolution of Lt BPPV.  Cont to assess and treat as indicated.   OBJECTIVE IMPAIRMENTS: decreased balance, difficulty walking, and dizziness.   ACTIVITY LIMITATIONS: bending, squatting, transfers, bed mobility, and locomotion level  PARTICIPATION LIMITATIONS: meal prep, laundry, shopping, and community activity  PERSONAL FACTORS: Age and Past/current experiences are also affecting patient's functional outcome.    REHAB POTENTIAL: Good  CLINICAL DECISION MAKING: Evolving/moderate complexity  EVALUATION COMPLEXITY: Moderate   PLAN:  PT FREQUENCY: 2x/week  PT DURATION: 4 weeks  PLANNED INTERVENTIONS: Therapeutic exercises, Therapeutic activity, Neuromuscular re-education, Balance training, Gait training, Patient/Family education, Self Care, Vestibular training, and Canalith repositioning  PLAN FOR NEXT SESSION: recheck Lt BPPV   Eshawn Coor, [place], PT 03/18/2022, 10:28 AM

## 2022-03-18 ENCOUNTER — Encounter: Payer: Self-pay | Admitting: Physical Therapy

## 2022-03-19 ENCOUNTER — Encounter: Payer: Self-pay | Admitting: Physical Therapy

## 2022-03-19 ENCOUNTER — Ambulatory Visit: Payer: PPO | Admitting: Physical Therapy

## 2022-03-19 DIAGNOSIS — R2681 Unsteadiness on feet: Secondary | ICD-10-CM

## 2022-03-19 DIAGNOSIS — H8112 Benign paroxysmal vertigo, left ear: Secondary | ICD-10-CM

## 2022-03-19 DIAGNOSIS — H8111 Benign paroxysmal vertigo, right ear: Secondary | ICD-10-CM | POA: Diagnosis not present

## 2022-03-19 NOTE — Progress Notes (Signed)
[facility] [place]                                     Cardiology Office Note:    Date:  03/20/2022   ID:  [name], DOB 06-18-1932, MRN [id number]  PCP:  Kathalene Frames, MD  [facility] Cardiologist:  Werner Lean, MD  / Dr. Angelena Form, MD & Dr. Tenny Craw, MD (TAVR)  [place] Electrophysiologist:  None   Referring MD: Kathalene Frames, *   1 month s/p TAVR  History of Present Illness:    MELQUIADES Barrera is a 88 y.o. male with a history of LAFB, RBBB, moderate CAD, NICM, depression, GERD, hiatal hernia, HTN, HLD, prior CVA, SVT, PVCs, mitral endocarditis 02/2021, progressive LV dysfunction and severe LFLG aortic stenosis s/p TAVR (02/10/22) who presents clinic for follow up.     [name] is followed by Dr. Gasper Sells for his cardiology care. He was found to have low flow/low gradient aortic stenosis in early 2022. Echocardiogram 12/02/21 showed an LVEF at 20-25% with mild MR, aortic valve is thickening with poor leaflet mobility with a mean gradient 37 mmHg, peak gradient 62 mmHg, AVA 0.59 cm2, and DI 0.13 consistent with severe AS. His LV function had been slowly falling over the past year despite GDMT. [facility] 03/2020 showed moderate calcified CAD (50-60% proximal to mid LAD stenosis, 50-60% distal LAD stenosis, 50% intermediate branch stenosis, 30% proximal Circumflex stenosis) with continued medical management recommended. He saw Dr. Gasper Sells 12/08/21 at which time he had no good explanation for his continued drop in EF and he was referred to the structural heart team for further evaluation of his aortic stenosis.    The patient was evaluated by the multidisciplinary valve team and underwent successful TAVR with a 26 mm Edwards Sapien 3 Ultra Resilia THV via the TF approach on 02/10/22. Post op echo showed EF 25%, normally functioning TAVR with a mean gradient of 13 mmHg and trivial PVL. He has transient HAVB  that resolved. Noted to have a lot of PVCs. He was discharged on POD1 on his home Plavix and with a live Zio AT monitor.   The following day he returned to the ER with an episode of double vision. CT scan was reassuring. Pt declined MRI because it would take too long and he felt like symptomat were reminiscent of previous vertigo.  Zio AT showed no HAVB but did show new onset afib as well as 20% burden of PVCs. Average HR of 80 bpm. He was started on Eliquis '5mg'$  BID and Plavix discontinued.   Today the patient presents to clinic for follow up. Here with wife. He is a little upset his heart function has not improved since TAVR. Overall doing okay. No CP. He has chronic mild SOB. Occasional fatigue. No LE edema, orthopnea or PND. No dizziness or syncope. No blood in stool or urine. No palpitations.     Past Medical History:  Diagnosis Date   Arthritis    Coronary artery disease    Depression    Dysrhythmia    "extra heart beat"   GERD (gastroesophageal reflux disease)    History of hiatal hernia    Hypercholesteremia    Hypertension    Lumbar spinal stenosis    RLS (restless legs syndrome)    takes ativan as needed   S/P TAVR (transcatheter  aortic valve replacement) 02/10/2022   106m S3UR via TF approach with Dr. MAngelena Formand Dr. ETenny Craw  Stroke ([facility] 01/2020   minor - no deficits    Past Surgical History:  Procedure Laterality Date   BACK SURGERY     COLONOSCOPY     EYE SURGERY Bilateral    cataracts removed    INTRAOPERATIVE TRANSTHORACIC ECHOCARDIOGRAM N/A 02/10/2022   Procedure: INTRAOPERATIVE TRANSTHORACIC ECHOCARDIOGRAM;  Surgeon: MBurnell Blanks MD;  [facility];  Service: Open Heart Surgery;  Laterality: N/A;   LUMBAR LAMINECTOMY/DECOMPRESSION MICRODISCECTOMY N/A 08/28/2015   Procedure: Lumbar Four-Five decompressive lumbar laminectomy;  Surgeon: RJovita Gamma MD;  [facility];  Service: Neurosurgery;  Laterality: N/A;   REPLACEMENT  TOTAL KNEE BILATERAL Bilateral 11/05/2003   RIGHT/LEFT HEART CATH AND CORONARY ANGIOGRAPHY N/A 03/28/2020   Procedure: RIGHT/LEFT HEART CATH AND CORONARY ANGIOGRAPHY;  Surgeon: MBurnell Blanks MD;  [place] [facility];  Service: Cardiovascular;  Laterality: N/A;   TEE WITHOUT CARDIOVERSION N/A 02/28/2021   Procedure: TRANSESOPHAGEAL ECHOCARDIOGRAM (TEE);  Surgeon: PFreada Bergeron MD;  [facility];  Service: Cardiovascular;  Laterality: N/A;   TEE WITHOUT CARDIOVERSION N/A 05/28/2021   Procedure: TRANSESOPHAGEAL ECHOCARDIOGRAM (TEE);  Surgeon: CWerner Lean MD;  [facility];  Service: Cardiovascular;  Laterality: N/A;   THORACIC DISCECTOMY N/A 07/10/2020   Procedure: Spinal cord stimulator via  - Thoracic Eight-Thoracic Nine, Thoracic Nine-Thoracic Ten Laminectomy;  Surgeon: JEustace Moore MD;  [place] [facility]  Service: Neurosurgery;  Laterality: N/A;  3C   TOE AMPUTATION Right 2010   second toe   TONSILLECTOMY     TRANSCATHETER AORTIC VALVE REPLACEMENT, TRANSFEMORAL N/A 02/10/2022   Procedure: Transcatheter Aortic Valve Replacement, Transfemoral;  Surgeon: MBurnell Blanks MD;  [facility];  Service: Open Heart Surgery;  Laterality: N/A;    Current Medications: Current Meds  Medication Sig   apixaban (ELIQUIS) 5 MG TABS tablet Take 1 tablet (5 mg total) by mouth 2 (two) times daily.   citalopram (CELEXA) 20 MG tablet Take 20 mg by mouth daily.   ezetimibe (ZETIA) 10 MG tablet Take 1 tablet (10 mg total) by mouth every evening.   fluticasone (FLONASE) 50 MCG/ACT nasal spray Place 1 spray into both nostrils daily as needed for allergies.    furosemide (LASIX) 20 MG tablet Take 1 tablet (20 mg total) by mouth daily. Take 2 tablets daily in case of weight gain 2 to 3 lbs in 24 hrs or 5 lbs in 7 days.   gabapentin (NEURONTIN) 600 MG tablet Take 600 mg by mouth every morning.   HYDROcodone-acetaminophen (NORCO) 7.5-325 MG  tablet Take 1 tablet by mouth every 8 (eight) hours as needed for moderate pain.   JARDIANCE 10 MG TABS tablet Take 10 mg by mouth daily.   LORazepam (ATIVAN) 1 MG tablet Take 1 mg by mouth at bedtime.   meclizine (ANTIVERT) 12.5 MG tablet Take 12.5 mg by mouth 3 (three) times daily as needed for dizziness.   metoprolol succinate (TOPROL-XL) 50 MG 24 hr tablet Take 1 tablet (50 mg total) by mouth daily. Take with or immediately following a meal.   Multiple Vitamin tablet Take 1 tablet by mouth daily.   omeprazole (PRILOSEC) 20 MG capsule Take 20 mg by mouth daily.   pravastatin (PRAVACHOL) 20 MG tablet Take 20 mg by mouth daily.   senna-docusate (SENOKOT-S) 8.6-50 MG tablet Take 1 tablet by mouth 2 (two) times daily.  tamsulosin (FLOMAX) 0.4 MG CAPS capsule Take 1 capsule (0.4 mg total) by mouth daily.   traZODone (DESYREL) 50 MG tablet Take 50 mg by mouth at bedtime.   triamcinolone (KENALOG) 0.1 % Apply 1 application topically daily as needed (irritation).   [DISCONTINUED] metoprolol succinate (TOPROL-XL) 25 MG 24 hr tablet Take 1 tablet (25 mg total) by mouth daily.   Current Facility-Administered Medications for the 03/20/22 encounter (Office Visit) with [facility]  Medication   triamcinolone acetonide (KENALOG) 10 MG/ML injection 10 mg     Allergies:   Simvastatin, Atorvastatin, Benazepril hcl, Losartan, Pravastatin, Rosuvastatin, and Spironolactone   Social History   Socioeconomic History   Marital status: Married    Spouse name: Not on file   Number of children: 2   Years of education: Not on file   Highest education level: Not on file  Occupational History   Occupation: Customer service manager  Tobacco Use   Smoking status: Some Days    Types: Cigars   Smokeless tobacco: Never   Tobacco comments:    quit 1972- currently 1 cigar per week  Vaping Use   Vaping Use: Never used  Substance and Sexual Activity   Alcohol use: Yes    Comment: very  occasionally beer/wine or scotch   Drug use: No    Comment: 1 cigar per week   Sexual activity: Not Currently  Other Topics Concern   Not on file  Social History Narrative   Right handed   Lives with wife one story home   Social Determinants of Health   Financial Resource Strain: Not on file  Food Insecurity: Not on file  Transportation Needs: Not on file  Physical Activity: Not on file  Stress: Not on file  Social Connections: Not on file     Family History: The patient's family history includes Hypertension in an other family member; Stroke in his mother.  ROS:   Please see the history of present illness.    All other systems reviewed and are negative.  EKGs/Labs/Other Studies Reviewed:    The following studies were reviewed today:  [facility]  TAVR OPERATIVE NOTE     Date of Procedure:                02/10/2022   Preoperative Diagnosis:      Severe Aortic Stenosis    Postoperative Diagnosis:    Same    Procedure:        Transcatheter Aortic Valve Replacement - Transfemoral Approach             Edwards Sapien 3 THV (size 26 mm, model # [id number], serial # [id number])              Co-Surgeons:                        Lauree Chandler, MD and Justice Rocher , MD    Anesthesiologist:                  Smith Robert   Echocardiographer:              Gasper Sells   Pre-operative Echo Findings: Severe aortic stenosis Moderate left ventricular systolic dysfunction   Post-operative Echo Findings:       Trivial paravalvular leak Moderate left ventricular systolic dysfunction _____________     Echo 02/11/22: IMPRESSIONS   1. Left ventricular ejection fraction, by estimation, is 25 to 30%. The  left ventricle  has severely decreased function. The left ventricle  demonstrates global hypokinesis. The left ventricular internal cavity size  was severely dilated. Left ventricular  diastolic parameters are consistent with Grade II diastolic dysfunction   (pseudonormalization).   2. Right ventricular systolic function is hyperdynamic. The right  ventricular size is normal. There is normal pulmonary artery systolic  pressure.   3. Left atrial size was severely dilated.   4. Right atrial size was mildly dilated.   5. The mitral valve is grossly normal. Mild mitral valve regurgitation.  No evidence of mitral stenosis.   6. The aortic valve has been replaced. Aortic valve regurgitation is  trivial, paravalvular and seen in the PLAX view.. There is a 26 mm Sapien  prosthetic (TAVR) valve present in the aortic position. Procedure Date:  02/10/22. Erfective orifice area, by  VTI measures 2.00 cm. Aortic valve mean gradient measures 13.0 mmHg. Peak  gradient 22 m Hg. DVI 0.41. Acceleration time 88 ms.   Comparison(s): Stable gradients. Slight PVL despite extra CC given at  deployment. Increased gradient despites low EF like reflects mild patient  prosthesis mismatch.   ______________________________  Elwyn Reach AT 11-02/2022 Study Highlights   Patient had a minimum heart rate of 51 bpm, maximum heart rate of 182 bpm, and average heart rate of 80 bpm.   Predominant underlying rhythm was sinus rhythm.   There is wide complex tachycardia- suspect SVT with aberrancy and atrial fibrillation as RBBB morphology persists.   Atrial Fibrillation occurred, ranging from 58-181 bpm (avg of 100 bpm), the longest lasting 16 hours 34 mins with an average rate of 100 bpm.   Isolated PACs were rare (<1.0%).   Isolated PVCs were frequent (20%).   No evidence of significant heart block.   Triggered and diary events associated with sinus rhythm, PACs, and PVCs.   New atrial fibrillation without heart block.  ________________________  Echo 03/20/22 IMPRESSIONS  1. Left ventricular ejection fraction, by estimation, is 20 to 25%. The  left ventricle has severely decreased function. The left ventricle  demonstrates global hypokinesis. There is moderate left  ventricular  hypertrophy. Left ventricular diastolic  parameters are consistent with Grade I diastolic dysfunction (impaired  relaxation).   2. Right ventricular systolic function is moderately reduced. The right  ventricular size is normal.   3. Left atrial size was severely dilated.   4. The mitral valve is abnormal. Mild mitral valve regurgitation.   5. The aortic valve has been repaired/replaced. Aortic valve  regurgitation is not visualized. There is a 26 mm Edwards Sapien 3 Ultra  Resilia valve present in the aortic position. Procedure Date: 01/2022.  Echo findings are consistent with normal  structure and function of the aortic valve prosthesis. Aortic valve area,  by VTI measures 1.80 cm. Aortic valve mean gradient measures 11.4 mmHg.  Aortic valve Vmax measures 2.17 m/s. Peak gradient 18.8 mmHg, DI 0.34.   6. Rhythm strip during this exam demonstrates sinus tachycardia and  premature ventricular contractions.   Comparison(s): Changes from prior study are noted. 12/02/2021: LVEF 20-25%,  severe AS.   EKG:  EKG is ordered today.    Recent Labs: 10/17/2021: B Natriuretic Peptide 1,366.3 02/11/2022: ALT 12; Hemoglobin 15.0; Magnesium 2.2; Platelets 165 02/18/2022: BUN 25; Creatinine, Ser 1.18; Potassium 4.6; Sodium 144  Recent Lipid Panel    Component Value Date/Time   CHOL 204 (H) 02/21/2020 0253   TRIG 199 (H) 02/21/2020 0253   HDL 42 02/21/2020 0253   CHOLHDL 4.9 02/21/2020 0253  VLDL 40 02/21/2020 0253   LDLCALC 122 (H) 02/21/2020 0253     Risk Assessment/Calculations:    CHA2DS2-VASc Score = 7   This indicates a 11.2% annual risk of stroke. The patient's score is based upon: CHF History: 1 HTN History: 1 Diabetes History: 0 Stroke History: 2 Vascular Disease History: 1 Age Score: 2 Gender Score: 0      Physical Exam:    VS:  BP 121/70   Pulse 66   Ht '5\' 7"'$  (1.702 m)   Wt 180 lb (81.6 kg)   SpO2 90%   BMI 28.19 kg/m     Wt Readings from Last 3  Encounters:  03/20/22 180 lb (81.6 kg)  02/18/22 176 lb 6.4 oz (80 kg)  02/11/22 173 lb (78.5 kg)     GEN:  Well nourished, well developed in no acute distress HEENT: Normal NECK: No JVD LYMPHATICS: No lymphadenopathy CARDIAC: RRR, frequent ectopy.  no murmurs, rubs, gallops RESPIRATORY:  Clear to auscultation without rales, wheezing or rhonchi  ABDOMEN: Soft, non-tender, non-distended MUSCULOSKELETAL:  No edema; No deformity  SKIN: Warm and dry.  NEUROLOGIC:  Alert and oriented x 3 PSYCHIATRIC:  Normal affect   ASSESSMENT:    1. S/P TAVR (transcatheter aortic valve replacement)   2. Lymphadenopathy   3. Pulmonary nodule   4. PAF (paroxysmal atrial fibrillation) ([place])   5. Heart block AV second degree   6. Frequent PVCs   7. Chronic systolic congestive heart failure ([place])   8. Coronary artery disease due to lipid rich plaque     PLAN:    In order of problems listed above:  Severe AS s/p TAVR: echo today shows EF 20-25%, moderate RV dysfunction, normally functioning TAVR with a mean gradient of 11.4 mm hg and no PVL as well as mild MR. He has NYHA class II symptoms. SBE prophylaxis discussed; I have RX'd amoxicillin. Continue Eliquis '5mg'$  BID. I will see him back for 1 year follow up and echo. He is likely relocating to [place] and will need to establish cardiology care.   Lymphadenopathy: pre TAVR CTs showed enlarged right hilar and subcarinal lymph nodes, likely reactive. Recommend follow-up chest CT in 3 months to ensure resolution. Due 03/2022. Will have this set up today.    Pulmonary nodule: pre TAVR CTs showed a solid subpleural pulmonary nodule of the right middle lobe measuring 4 mm. No follow-up needed if patient is low-risk. Will follow on Jan 2024 CT and follow subsequent recs.   PAF: noted on recent monitor with 9% burden. Asymptomatic. CHADSVACS score of 7 with an 11.2% annual stroke risk, and I feel like we should initiate [place]. Continue Eliquis '5mg'$  BID. Continue  toprol xl   2:1 AV block: noted after TAVR deployment. No recurrent HAVB on monitor    PVCS: has 20% PVC burden on Zio monitor. Will increase Toprol XL from '25mg'$ --> '50mg'$  daily. He has had intolerances to BBs in the past so will make sure he tolerates dose increase.  HFrEF: GDMT limited by previous intolerances (ACE/ARB/Sprio listed as allergies). Currently on Jardiance and Toprol. As above, increasing Toprol given 20% burden of PVCs. Will see how he does with this and then consider rechallenging an ACEi/ARB/ARNi in the future ( he does not think these were true allergies). Thankfully he does not have any clinical CHF. Continue lasix '20mg'$  daily.    CAD: cath 03/2020 showed moderate calcified CAD (50-60% proximal to mid LAD stenosis, 50-60% distal LAD stenosis, 50% intermediate branch stenosis,  30% proximal Circumflex stenosis). Continue medical therapy.        Medication Adjustments/Labs and Tests Ordered: Current medicines are reviewed at length with the patient today.  Concerns regarding medicines are outlined above.  Orders Placed This Encounter  Procedures   CT CHEST WO CONTRAST   Meds ordered this encounter  Medications   metoprolol succinate (TOPROL-XL) 50 MG 24 hr tablet    Sig: Take 1 tablet (50 mg total) by mouth daily. Take with or immediately following a meal.    Dispense:  90 tablet    Refill:  3    Patient Instructions  Medication Instructions:  Your physician has recommended you make the following change in your medication:  INCREASE: metoprolol succinate (Toprol-XL) to 50 mg by mouth once daily  *If you need a refill on your cardiac medications before your next appointment, please call your pharmacy*   Lab Work: NONE If you have labs (blood work) drawn today and your tests are completely normal, you will receive your results only by: [place] (if you have MyChart) OR A paper copy in the mail If you have any lab test that is abnormal or we need to change  your treatment, we will call you to review the results.   Testing/Procedures: Your physician has requested that you have a CT Chest.    Follow-Up: As scheduled  At [facility], you and your health needs are our priority.  As part of our continuing mission to provide you with exceptional heart care, we have created designated Provider Care Teams.  These Care Teams include your primary Cardiologist (physician) and Advanced Practice Providers (APPs -  Physician Assistants and Nurse Practitioners) who all work together to provide you with the care you need, when you need it.  We recommend signing up for the patient portal called "MyChart".  Sign up information is provided on this After Visit Summary.  MyChart is used to connect with patients for Virtual Visits (Telemedicine).  Patients are able to view lab/test results, encounter notes, upcoming appointments, etc.  Non-urgent messages can be sent to your provider as well.   To learn more about what you can do with MyChart, go to [email or web address].     Other Instructions Good luck with your move to [place]!  Important Information About Sugar         Signed, Angelena Form, PA-C  03/20/2022 12:35 PM    [facility]

## 2022-03-19 NOTE — Therapy (Signed)
OUTPATIENT PHYSICAL THERAPY VESTIBULAR TREATMENT NOTE     Patient Name: Ruben Barrera MRN: [id number] DOB:Apr 13, 1932, 88 y.o., male Today's Date: 03/19/2022  END OF SESSION:  PT End of Session - 03/19/22 1841     Visit Number 4    Number of Visits 8    Date for PT Re-Evaluation 04/03/22    Authorization Type HTA    Authorization Time Period 03-10-22 - 04-22-22    PT Start Time 1400    PT Stop Time 1440    PT Time Calculation (min) 40 min    Activity Tolerance Patient tolerated treatment well    Behavior During Therapy WFL for tasks assessed/performed               Past Medical History:  Diagnosis Date   Arthritis    Coronary artery disease    Depression    Dysrhythmia    "extra heart beat"   GERD (gastroesophageal reflux disease)    History of hiatal hernia    Hypercholesteremia    Hypertension    Lumbar spinal stenosis    RLS (restless legs syndrome)    takes ativan as needed   S/P TAVR (transcatheter aortic valve replacement) 02/10/2022   67m S3UR via TF approach with Dr. MAngelena Formand Dr. ETenny Craw  Stroke ([facility] 01/2020   minor - no deficits   Past Surgical History:  Procedure Laterality Date   BACK SURGERY     COLONOSCOPY     EYE SURGERY Bilateral    cataracts removed    INTRAOPERATIVE TRANSTHORACIC ECHOCARDIOGRAM N/A 02/10/2022   Procedure: INTRAOPERATIVE TRANSTHORACIC ECHOCARDIOGRAM;  Surgeon: MBurnell Blanks MD;  [place];  Service: Open Heart Surgery;  Laterality: N/A;   LUMBAR LAMINECTOMY/DECOMPRESSION MICRODISCECTOMY N/A 08/28/2015   Procedure: Lumbar Four-Five decompressive lumbar laminectomy;  Surgeon: RJovita Gamma MD;  [place] [facility];  Service: Neurosurgery;  Laterality: N/A;   REPLACEMENT TOTAL KNEE BILATERAL Bilateral 11/05/2003   RIGHT/LEFT HEART CATH AND CORONARY ANGIOGRAPHY N/A 03/28/2020   Procedure: RIGHT/LEFT HEART CATH AND CORONARY ANGIOGRAPHY;  Surgeon: MBurnell Blanks MD;  [place];  Service: Cardiovascular;  Laterality: N/A;   TEE WITHOUT CARDIOVERSION N/A 02/28/2021   Procedure: TRANSESOPHAGEAL ECHOCARDIOGRAM (TEE);  Surgeon: PFreada Bergeron MD;  [facility];  Service: Cardiovascular;  Laterality: N/A;   TEE WITHOUT CARDIOVERSION N/A 05/28/2021   Procedure: TRANSESOPHAGEAL ECHOCARDIOGRAM (TEE);  Surgeon: CWerner Lean MD;  [facility];  Service: Cardiovascular;  Laterality: N/A;   THORACIC DISCECTOMY N/A 07/10/2020   Procedure: Spinal cord stimulator via  - Thoracic Eight-Thoracic Nine, Thoracic Nine-Thoracic Ten Laminectomy;  Surgeon: JEustace Moore MD;  [place]  Service: Neurosurgery;  Laterality: N/A;  3C   TOE AMPUTATION Right 2010   second toe   TONSILLECTOMY     TRANSCATHETER AORTIC VALVE REPLACEMENT, TRANSFEMORAL N/A 02/10/2022   Procedure: Transcatheter Aortic Valve Replacement, Transfemoral;  Surgeon: MBurnell Blanks MD;  [place];  Service: Open Heart Surgery;  Laterality: N/A;   Patient Active Problem List   Diagnosis Date Noted   Severe aortic valve stenosis 02/10/2022   S/P TAVR (transcatheter aortic valve replacement) 02/10/2022   [place]ting disorder [place] 01/26/2022   Coronary artery disease    Chronic kidney disease, stage 3a (HCC)    Bacterial endocarditis    Bacteremia due to Streptococcus 02/28/2021   RLS (restless legs syndrome) 02/26/2021   S/P insertion of spinal cord stimulator 07/10/2020  Frequent PVCs 04/15/2020   HFrEF (heart failure with reduced ejection fraction) ([place]) 04/15/2020   SVT (supraventricular tachycardia) 04/15/2020   Coronary artery disease involving native coronary artery of native heart without angina pectoris 44/31/5400   Chronic systolic congestive heart failure ([place])    Mixed hyperlipidemia    Tobacco abuse    Depression    Erythrocytosis 02/20/2020   BPH (benign prostatic hyperplasia) 02/20/2020   HNP (herniated nucleus  pulposus), lumbar 01/13/2016   Lumbar stenosis with neurogenic claudication 08/28/2015   Left shoulder pain 05/05/2011    PCP: Kathalene Frames, MD  REFERRING PROVIDER: Kathalene Frames, MD   REFERRING DIAG: R42 (ICD-10-CM) - Vertigo  THERAPY DIAG:  BPPV (benign paroxysmal positional vertigo), left  Unsteadiness on feet  ONSET DATE: Referral date 02-25-22  Rationale for Evaluation and Treatment: Rehabilitation  SUBJECTIVE:   SUBJECTIVE STATEMENT: Pt reports he has not had any dizziness until he was driving to this appt and was looking into the sun - states he doesn't think he was wearing his tinted eye glasses - thinks he may have left them at home; states dizziness is much improved overall  Pt accompanied by: self  PERTINENT HISTORY:   Ruben Barrera is a 88 y.o. male with a hx of HTN, HLD, Non-obstructive CAD without intervention, HFrEF,  LFLG-AS, right CVA stroke, frequent PVCs and SVT; s/p TAVR 02-10-22  PAIN:  Are you having pain? No  PRECAUTIONS: None  WEIGHT BEARING RESTRICTIONS: No  FALLS: Has patient fallen in last 6 months? No  LIVING ENVIRONMENT: Lives with: lives with their spouse Lives in: House/apartment  PLOF: Independent  PATIENT GOALS: resolve the vertigo  OBJECTIVE:   GAIT: Gait pattern: step through pattern Distance walked: 100' Assistive device utilized: None Level of assistance: Modified independence Comments: unsteady due to dizziness  VESTIBULAR TREATMENT:  NeuroRe-ed:     Positional tests:             Rt sidelying test (-) with no nystagmus and no c/o vertigo in sidelying position; Lt sidelying test (-) with no nystagmus and c/o vertigo in sidelying position  Pt performed standing balance exercises at counter with UE support for safety;  forward, back and side kicks 10 reps each leg; marching in place;  SLS 10 sec hold each leg Standing with feet together EO 15 sec hold - then added horizontal head turns 5 reps with UE  support on counter due to unsteadiness  PATIENT EDUCATION: Education details:  balance HEP - kicks 3 directions, marching and SLS - handout provided Person educated: Patient Education method: Explanation, Demonstration, and Handouts Education comprehension: verbalized understanding and returned demonstration  HOME EXERCISE PROGRAM:  GOALS: Goals reviewed with patient? Yes  LONG TERM GOALS: Target date: 04-03-22  LONG TERM GOALS: Target date:   Pt will have (-) Rt horizontal roll test to indicate resolution of Rt BPPV horizontal cupulolithiasis.  Baseline:  Goal status: INITIAL  2.  Pt will perform bed mobility with no c/o dizziness. Baseline:  Goal status: INITIAL  3.  Pt will report no vertigo with any mobility.  Baseline:  Goal status: INITIAL  4.  Independent in HEP for habituation and self treatment of BPPV. Baseline:  Goal status: INITIAL   ASSESSMENT:  CLINICAL IMPRESSION: Pt had (-) Lt sidelying test with no c/o vertigo in test position and also demonstrated cervical extension with ability to look up without provocation of dizziness.  Pt states he feels is walking is improved with increased steadiness noted.  Pt needed UE support with standing balance exercises issued for HEP.  Cont with POC.   OBJECTIVE IMPAIRMENTS: decreased balance, difficulty walking, and dizziness.   ACTIVITY LIMITATIONS: bending, squatting, transfers, bed mobility, and locomotion level  PARTICIPATION LIMITATIONS: meal prep, laundry, shopping, and community activity  PERSONAL FACTORS: Age and Past/current experiences are also affecting patient's functional outcome.   REHAB POTENTIAL: Good  CLINICAL DECISION MAKING: Evolving/moderate complexity  EVALUATION COMPLEXITY: Moderate   PLAN:  PT FREQUENCY: 2x/week  PT DURATION: 4 weeks  PLANNED INTERVENTIONS: Therapeutic exercises, Therapeutic activity, Neuromuscular re-education, Balance training, Gait training, Patient/Family  education, Self Care, Vestibular training, and Canalith repositioning  PLAN FOR NEXT SESSION: pt to return in 3 weeks - check balance HEP, assess vertigo prn    Alda Lea, PT 03/19/2022, 6:43 PM

## 2022-03-20 ENCOUNTER — Other Ambulatory Visit: Payer: Self-pay | Admitting: Physician Assistant

## 2022-03-20 ENCOUNTER — Ambulatory Visit (HOSPITAL_COMMUNITY): Payer: PPO | Attending: Internal Medicine

## 2022-03-20 ENCOUNTER — Ambulatory Visit: Payer: PPO | Attending: Physician Assistant | Admitting: Physician Assistant

## 2022-03-20 VITALS — BP 121/70 | HR 66 | Ht 67.0 in | Wt 180.0 lb

## 2022-03-20 DIAGNOSIS — I2510 Atherosclerotic heart disease of native coronary artery without angina pectoris: Secondary | ICD-10-CM | POA: Diagnosis not present

## 2022-03-20 DIAGNOSIS — I493 Ventricular premature depolarization: Secondary | ICD-10-CM | POA: Diagnosis not present

## 2022-03-20 DIAGNOSIS — I5022 Chronic systolic (congestive) heart failure: Secondary | ICD-10-CM | POA: Diagnosis not present

## 2022-03-20 DIAGNOSIS — R591 Generalized enlarged lymph nodes: Secondary | ICD-10-CM | POA: Diagnosis not present

## 2022-03-20 DIAGNOSIS — Z952 Presence of prosthetic heart valve: Secondary | ICD-10-CM

## 2022-03-20 DIAGNOSIS — I441 Atrioventricular block, second degree: Secondary | ICD-10-CM | POA: Diagnosis not present

## 2022-03-20 DIAGNOSIS — I2583 Coronary atherosclerosis due to lipid rich plaque: Secondary | ICD-10-CM

## 2022-03-20 DIAGNOSIS — R911 Solitary pulmonary nodule: Secondary | ICD-10-CM

## 2022-03-20 DIAGNOSIS — I480 Paroxysmal atrial fibrillation: Secondary | ICD-10-CM | POA: Diagnosis not present

## 2022-03-20 LAB — ECHOCARDIOGRAM COMPLETE
AR max vel: 1.8 cm2
AV Area VTI: 1.8 cm2
AV Area mean vel: 1.85 cm2
AV Mean grad: 11.4 mmHg
AV Peak grad: 18.8 mmHg
Ao pk vel: 2.17 m/s
Area-P 1/2: 2.66 cm2
Calc EF: 33 %
S' Lateral: 4.67 cm
Single Plane A2C EF: 59.7 %
Single Plane A4C EF: -10.1 %

## 2022-03-20 MED ORDER — METOPROLOL SUCCINATE ER 50 MG PO TB24: 50.0000 mg | ORAL_TABLET | Freq: Every day | ORAL | 3 refills | Status: AC

## 2022-03-20 NOTE — Patient Instructions (Signed)
Medication Instructions:  Your physician has recommended you make the following change in your medication:  INCREASE: metoprolol succinate (Toprol-XL) to 50 mg by mouth once daily  *If you need a refill on your cardiac medications before your next appointment, please call your pharmacy*   Lab Work: NONE If you have labs (blood work) drawn today and your tests are completely normal, you will receive your results only by: [place] (if you have MyChart) OR A paper copy in the mail If you have any lab test that is abnormal or we need to change your treatment, we will call you to review the results.   Testing/Procedures: Your physician has requested that you have a CT Chest.    Follow-Up: As scheduled  At [facility], you and your health needs are our priority.  As part of our continuing mission to provide you with exceptional heart care, we have created designated Provider Care Teams.  These Care Teams include your primary Cardiologist (physician) and Advanced Practice Providers (APPs -  Physician Assistants and Nurse Practitioners) who all work together to provide you with the care you need, when you need it.  We recommend signing up for the patient portal called "MyChart".  Sign up information is provided on this After Visit Summary.  MyChart is used to connect with patients for Virtual Visits (Telemedicine).  Patients are able to view lab/test results, encounter notes, upcoming appointments, etc.  Non-urgent messages can be sent to your provider as well.   To learn more about what you can do with MyChart, go to [email or web address].     Other Instructions Good luck with your move to [place]!  Important Information About [employer]

## 2022-03-25 ENCOUNTER — Ambulatory Visit (HOSPITAL_BASED_OUTPATIENT_CLINIC_OR_DEPARTMENT_OTHER)
Admission: RE | Admit: 2022-03-25 | Discharge: 2022-03-25 | Disposition: A | Discharge status: Home / Self Care | Payer: PPO | Source: Ambulatory Visit | Attending: Physician Assistant | Admitting: Physician Assistant

## 2022-03-25 DIAGNOSIS — J479 Bronchiectasis, uncomplicated: Secondary | ICD-10-CM | POA: Diagnosis not present

## 2022-03-25 DIAGNOSIS — R911 Solitary pulmonary nodule: Secondary | ICD-10-CM | POA: Insufficient documentation

## 2022-03-25 DIAGNOSIS — R918 Other nonspecific abnormal finding of lung field: Secondary | ICD-10-CM | POA: Diagnosis not present

## 2022-03-25 DIAGNOSIS — R591 Generalized enlarged lymph nodes: Secondary | ICD-10-CM | POA: Insufficient documentation

## 2022-03-28 ENCOUNTER — Other Ambulatory Visit: Payer: PPO

## 2022-03-29 DIAGNOSIS — R52 Pain, unspecified: Secondary | ICD-10-CM | POA: Diagnosis not present

## 2022-03-29 DIAGNOSIS — R112 Nausea with vomiting, unspecified: Secondary | ICD-10-CM | POA: Diagnosis not present

## 2022-03-29 DIAGNOSIS — Z03818 Encounter for observation for suspected exposure to other biological agents ruled out: Secondary | ICD-10-CM | POA: Diagnosis not present

## 2022-03-29 DIAGNOSIS — B349 Viral infection, unspecified: Secondary | ICD-10-CM | POA: Diagnosis not present

## 2022-03-29 DIAGNOSIS — R0982 Postnasal drip: Secondary | ICD-10-CM | POA: Diagnosis not present

## 2022-03-29 DIAGNOSIS — R051 Acute cough: Secondary | ICD-10-CM | POA: Diagnosis not present

## 2022-04-02 DIAGNOSIS — H532 Diplopia: Secondary | ICD-10-CM | POA: Diagnosis not present

## 2022-04-09 ENCOUNTER — Encounter: Payer: Self-pay | Admitting: Physical Therapy

## 2022-04-09 ENCOUNTER — Ambulatory Visit: Payer: PPO | Attending: Internal Medicine | Admitting: Physical Therapy

## 2022-04-09 DIAGNOSIS — R2689 Other abnormalities of gait and mobility: Secondary | ICD-10-CM | POA: Diagnosis not present

## 2022-04-09 DIAGNOSIS — R2681 Unsteadiness on feet: Secondary | ICD-10-CM

## 2022-04-09 NOTE — Therapy (Signed)
OUTPATIENT PHYSICAL THERAPY VESTIBULAR TREATMENT NOTE     Patient Name: Ruben Barrera MRN: [id number] DOB:[birth year], 89 y.o., male Today's Date: 04/09/2022  END OF SESSION:  PT End of Session - 04/09/22 1939     Visit Number 5    Number of Visits 8    Date for PT Re-Evaluation 04/03/22    Authorization Type HTA    Authorization Time Period 03-10-22 - 04-22-22    PT Start Time 1315    PT Stop Time 1400    PT Time Calculation (min) 45 min    Equipment Utilized During Treatment Other (comment)   1/4" heel lift   Activity Tolerance Patient tolerated treatment well    Behavior During Therapy WFL for tasks assessed/performed                Past Medical History:  Diagnosis Date   Arthritis    Coronary artery disease    Depression    Dysrhythmia    "extra heart beat"   GERD (gastroesophageal reflux disease)    History of hiatal hernia    Hypercholesteremia    Hypertension    Lumbar spinal stenosis    RLS (restless legs syndrome)    takes ativan as needed   S/P TAVR (transcatheter aortic valve replacement) 02/10/2022   30m S3UR via TF approach with Dr. MAngelena Formand Dr. ETenny Craw  Stroke ([facility] 01/2020   minor - no deficits   Past Surgical History:  Procedure Laterality Date   BACK SURGERY     COLONOSCOPY     EYE SURGERY Bilateral    cataracts removed    INTRAOPERATIVE TRANSTHORACIC ECHOCARDIOGRAM N/A 02/10/2022   Procedure: INTRAOPERATIVE TRANSTHORACIC ECHOCARDIOGRAM;  Surgeon: MBurnell Blanks MD;  [place] [facility];  Service: Open Heart Surgery;  Laterality: N/A;   LUMBAR LAMINECTOMY/DECOMPRESSION MICRODISCECTOMY N/A 08/28/2015   Procedure: Lumbar Four-Five decompressive lumbar laminectomy;  Surgeon: RJovita Gamma MD;  [place];  Service: Neurosurgery;  Laterality: N/A;   REPLACEMENT TOTAL KNEE BILATERAL Bilateral 11/05/2003   RIGHT/LEFT HEART CATH AND CORONARY ANGIOGRAPHY N/A 03/28/2020   Procedure: RIGHT/LEFT HEART CATH AND  CORONARY ANGIOGRAPHY;  Surgeon: MBurnell Blanks MD;  [place] [facility];  Service: Cardiovascular;  Laterality: N/A;   TEE WITHOUT CARDIOVERSION N/A 02/28/2021   Procedure: TRANSESOPHAGEAL ECHOCARDIOGRAM (TEE);  Surgeon: PFreada Bergeron MD;  [facility];  Service: Cardiovascular;  Laterality: N/A;   TEE WITHOUT CARDIOVERSION N/A 05/28/2021   Procedure: TRANSESOPHAGEAL ECHOCARDIOGRAM (TEE);  Surgeon: CWerner Lean MD;  [facility];  Service: Cardiovascular;  Laterality: N/A;   THORACIC DISCECTOMY N/A 07/10/2020   Procedure: Spinal cord stimulator via  - Thoracic Eight-Thoracic Nine, Thoracic Nine-Thoracic Ten Laminectomy;  Surgeon: JEustace Moore MD;  [place]  Service: Neurosurgery;  Laterality: N/A;  3C   TOE AMPUTATION Right 2010   second toe   TONSILLECTOMY     TRANSCATHETER AORTIC VALVE REPLACEMENT, TRANSFEMORAL N/A 02/10/2022   Procedure: Transcatheter Aortic Valve Replacement, Transfemoral;  Surgeon: MBurnell Blanks MD;  [place] [facility];  Service: Open Heart Surgery;  Laterality: N/A;   Patient Active Problem List   Diagnosis Date Noted   Severe aortic valve stenosis 02/10/2022   S/P TAVR (transcatheter aortic valve replacement) 02/10/2022   Blood clotting disorder ([place] 01/26/2022   Coronary artery disease    Chronic kidney disease, stage 3a (HCC)    Bacterial endocarditis    Bacteremia due to Streptococcus 02/28/2021  RLS (restless legs syndrome) 02/26/2021   S/P insertion of spinal cord stimulator 07/10/2020   Frequent PVCs 04/15/2020   HFrEF (heart failure with reduced ejection fraction) ([place]) 04/15/2020   SVT (supraventricular tachycardia) 04/15/2020   Coronary artery disease involving native coronary artery of native heart without angina pectoris 09/81/1914   Chronic systolic congestive heart failure ([place])    Mixed hyperlipidemia    Tobacco abuse    Depression    Erythrocytosis 02/20/2020    BPH (benign prostatic hyperplasia) 02/20/2020   HNP (herniated nucleus pulposus), lumbar 01/13/2016   Lumbar stenosis with neurogenic claudication 08/28/2015   Left shoulder pain 05/05/2011    PCP: Kathalene Frames, MD  REFERRING PROVIDER: Kathalene Frames, MD   REFERRING DIAG: R42 (ICD-10-CM) - Vertigo  THERAPY DIAG:  Unsteadiness on feet  Other abnormalities of gait and mobility  ONSET DATE: Referral date 02-25-22  Rationale for Evaluation and Treatment: Rehabilitation  SUBJECTIVE:   SUBJECTIVE STATEMENT: Pt reports he has had some episodes of double vision - went to eye doctor and said he didn't find anything abnormal, stated vision was fine; also has had some blurred vision - describes as "fluttering":  describes it as looking out of dirty window (occurs intermittently):  is doing balance exercises daily in addition to riding his stationary bike Pt accompanied by: self  PERTINENT HISTORY:   [name] is a 89 y.o. male with a hx of HTN, HLD, Non-obstructive CAD without intervention, HFrEF,  LFLG-AS, right CVA stroke, frequent PVCs and SVT; s/p TAVR 02-10-22  PAIN:  Are you having pain? No  PRECAUTIONS: None  WEIGHT BEARING RESTRICTIONS: No  FALLS: Has patient fallen in last 6 months? No  LIVING ENVIRONMENT: Lives with: lives with their spouse Lives in: House/apartment  PLOF: Independent  PATIENT GOALS: resolve the vertigo  OBJECTIVE:   GAIT: Gait pattern: step through pattern Distance walked: 100' Assistive device utilized: None Level of assistance: Modified independence Comments: pt had Trendelenburg gait pattern due to LLD with RLE shorter than LLE;  1/4" heel lift placed in Rt shoe and gait significantly improved; pt stated he felt much more stable and balanced, with less lateral lean to Rt side  VESTIBULAR TREATMENT:  NeuroRe-ed:    Pt performed x1 viewing exercise standing on floor with target on plain background, 4' away; 15 secs  horizontal and then 15 secs vertical head turns with no c/o dizziness or increased symptoms; pt reported no problem at all with this exercise, so exercise was not issued for HEP    Standing Balance: Surface: Airex Position: Feet Hip Width Apart Completed with: Eyes Open and Eyes Closed; Head Turns x 5 Reps and Head Nods x 5 Reps  Single Leg Stance:   Surface: Floor  Lower Extremity: RLE and LLE  Time: approx. 5 secs with UE support on counter prn  PATIENT EDUCATION: Education details:  balance HEP - kicks 3 directions, marching and SLS - handout provided; added standing on compliant surface in corner to HEP - with EO and EC - with head turns (pt declined picture of this exercise) Person educated: Patient Education method: Explanation, Demonstration, and Handouts Education comprehension: verbalized understanding and returned demonstration  HOME EXERCISE PROGRAM:  GOALS: Goals reviewed with patient? Yes  LONG TERM GOALS: Target date: 04-03-22  LONG TERM GOALS: Target date:   Pt will have (-) Rt horizontal roll test to indicate resolution of Rt BPPV horizontal cupulolithiasis.  Baseline:  Goal status: Goal met 03-19-22  2.  Pt  will perform bed mobility with no c/o dizziness. Baseline:  Goal status: Goal met 04-09-22  3.  Pt will report no vertigo with any mobility.  Baseline:  Goal status: Goal met 04-09-22  4.  Independent in HEP for habituation and self treatment of BPPV. Baseline:  Goal status: Goal met 04-09-22   ASSESSMENT:  CLINICAL IMPRESSION: Pt reports he has not had any spinning vertigo since previous treatment session on 03-19-22.  BPPV appears to have resolved.  PT session today focused on gait and balance assessment as pt reported he feels much improved since initial eval but still doesn't feel completely balanced.  Pt was noted to have LLD during gait analysis with RLE shorter than LLE.  1/4" heel lift was placed in Rt shoe - pt ambulated approx. 100' with  significant improvement in gait pattern.  Pt reported he felt much more balanced with less lateral lean to Rt side with use of heel lift in Rt shoe.  Added standing on compliant surface with EO and EC to HEP.  Pt has met LTG's #1-4;  pt requested follow up appt in 6 weeks to assess vertigo and balance at that time.   Cont with POC.   OBJECTIVE IMPAIRMENTS: decreased balance, difficulty walking, and dizziness.   ACTIVITY LIMITATIONS: bending, squatting, transfers, bed mobility, and locomotion level  PARTICIPATION LIMITATIONS: meal prep, laundry, shopping, and community activity  PERSONAL FACTORS: Age and Past/current experiences are also affecting patient's functional outcome.   REHAB POTENTIAL: Good  CLINICAL DECISION MAKING: Evolving/moderate complexity  EVALUATION COMPLEXITY: Moderate   PLAN:  PT FREQUENCY: 2x/week  PT DURATION: 4 weeks  PLANNED INTERVENTIONS: Therapeutic exercises, Therapeutic activity, Neuromuscular re-education, Balance training, Gait training, Patient/Family education, Self Care, Vestibular training, and Canalith repositioning  PLAN FOR NEXT SESSION:  Renew/ D/C (?)  next session;  pt to return in 6 weeks - check balance HEP, (added standing on foam on 04-09-22); assess gait - heel lift still being used?  assess vertigo prn    Alda Lea, PT 04/09/2022, 7:43 PM

## 2022-04-13 DIAGNOSIS — R5081 Fever presenting with conditions classified elsewhere: Secondary | ICD-10-CM | POA: Diagnosis not present

## 2022-04-13 DIAGNOSIS — R051 Acute cough: Secondary | ICD-10-CM | POA: Diagnosis not present

## 2022-04-13 DIAGNOSIS — R001 Bradycardia, unspecified: Secondary | ICD-10-CM | POA: Diagnosis not present

## 2022-04-13 DIAGNOSIS — U071 COVID-19: Secondary | ICD-10-CM | POA: Diagnosis not present

## 2022-04-13 DIAGNOSIS — I5022 Chronic systolic (congestive) heart failure: Secondary | ICD-10-CM | POA: Diagnosis not present

## 2022-04-22 DIAGNOSIS — Z9689 Presence of other specified functional implants: Secondary | ICD-10-CM | POA: Diagnosis not present

## 2022-04-22 DIAGNOSIS — Z6826 Body mass index (BMI) 26.0-26.9, adult: Secondary | ICD-10-CM | POA: Diagnosis not present

## 2022-04-22 DIAGNOSIS — G894 Chronic pain syndrome: Secondary | ICD-10-CM | POA: Diagnosis not present

## 2022-04-22 DIAGNOSIS — F1120 Opioid dependence, uncomplicated: Secondary | ICD-10-CM | POA: Diagnosis not present

## 2022-05-05 ENCOUNTER — Ambulatory Visit: Payer: PPO | Attending: Internal Medicine | Admitting: Internal Medicine

## 2022-05-05 ENCOUNTER — Encounter: Payer: Self-pay | Admitting: Internal Medicine

## 2022-05-05 VITALS — BP 154/70 | HR 86 | Ht 67.0 in | Wt 175.2 lb

## 2022-05-05 DIAGNOSIS — Z79899 Other long term (current) drug therapy: Secondary | ICD-10-CM

## 2022-05-05 DIAGNOSIS — Z952 Presence of prosthetic heart valve: Secondary | ICD-10-CM

## 2022-05-05 DIAGNOSIS — I350 Nonrheumatic aortic (valve) stenosis: Secondary | ICD-10-CM

## 2022-05-05 DIAGNOSIS — Z720 Tobacco use: Secondary | ICD-10-CM

## 2022-05-05 DIAGNOSIS — I2510 Atherosclerotic heart disease of native coronary artery without angina pectoris: Secondary | ICD-10-CM

## 2022-05-05 DIAGNOSIS — I5022 Chronic systolic (congestive) heart failure: Secondary | ICD-10-CM | POA: Diagnosis not present

## 2022-05-05 DIAGNOSIS — I471 Supraventricular tachycardia, unspecified: Secondary | ICD-10-CM

## 2022-05-05 DIAGNOSIS — I493 Ventricular premature depolarization: Secondary | ICD-10-CM

## 2022-05-05 DIAGNOSIS — I5020 Unspecified systolic (congestive) heart failure: Secondary | ICD-10-CM | POA: Diagnosis not present

## 2022-05-05 DIAGNOSIS — I480 Paroxysmal atrial fibrillation: Secondary | ICD-10-CM | POA: Insufficient documentation

## 2022-05-05 DIAGNOSIS — I2583 Coronary atherosclerosis due to lipid rich plaque: Secondary | ICD-10-CM

## 2022-05-05 DIAGNOSIS — I330 Acute and subacute infective endocarditis: Secondary | ICD-10-CM

## 2022-05-05 MED ORDER — LOSARTAN POTASSIUM 25 MG PO TABS: 12.5000 mg | ORAL_TABLET | Freq: Every day | ORAL | 3 refills | Status: AC

## 2022-05-05 NOTE — Progress Notes (Signed)
Cardiology Office Note:    Date:  05/05/2022   ID:  Ruben Barrera, DOB 1932-10-06, MRN [id number]  PCP:  Kathalene Frames, MD   [place] Prov[facility] Cardiologist:  Werner Lean, MD     Referring MD: Kathalene Frames, *   CC:  follow up HFrEF  History of Present Illness:    Ruben Barrera is a 89 y.o. male with a hx of HTN, HLD, Borderline Obstructive Cad, HFrEF (EF 25%-45% though our care starting 2022), Frequent PVCs, Asymptomatic SVT, history of prior stroke, and persistent tobacco abuse. 2022: In interval had LHC 03/28/20 with diagnosis of CAD and AS.   EP eval- no plans for more aggressive NSVT therapy; per EP and given age no plans for ICD or CRT-P.  Had back surgery 07/10/20.  I had also discuss his MRA with Dr. Felipa Eth (we DC/ed this medication).  Since last visit he has had echo showing that is again consistent with Moderate to Severe LFLG AS.  He has had interval 12/22 admission and eval for infective endocarditis (saw Dr. Acie Fredrickson, Johney Frame, and [place] in interim). 2023: has TEE with healed vegetations.  Has summer admission with SOB needing diuresis.  LVEF has further declined.  S/p TAVR.  No improvement in LVEF.  Slight increase in pulmonary nodule.  Found to AF. Started on eliquis.  Patient notes that he is doing fine.   Since last visit notes he was hoping for some clarification (AS improved s/p TAVR; he did not have improvement with LVEF . There are no interval hospital/ED visit.    No chest pain or pressure .  No SOB/DOE and no PND/Orthopnea.  No weight gain or leg swelling.  No palpitations or syncope.  Apple Watch has stopped working. He has no bleeding or bruising. Biking 4 days a week, dose one day of band work.   Past Medical History:  Diagnosis Date   Arthritis    Coronary artery disease    Depression    Dysrhythmia    "extra heart beat"   GERD (gastroesophageal reflux disease)    History of hiatal hernia     Hypercholesteremia    Hypertension    Lumbar spinal stenosis    RLS (restless legs syndrome)    takes ativan as needed   S/P TAVR (transcatheter aortic valve replacement) 02/10/2022   55m S3UR via TF approach with Dr. MAngelena Formand Dr. ETenny Craw  Stroke ([facility] 01/2020   minor - no deficits    Past Surgical History:  Procedure Laterality Date   BACK SURGERY     COLONOSCOPY     EYE SURGERY Bilateral    cataracts removed    INTRAOPERATIVE TRANSTHORACIC ECHOCARDIOGRAM N/A 02/10/2022   Procedure: INTRAOPERATIVE TRANSTHORACIC ECHOCARDIOGRAM;  Surgeon: MBurnell Blanks MD;  [facility];  Service: Open Heart Surgery;  Laterality: N/A;   LUMBAR LAMINECTOMY/DECOMPRESSION MICRODISCECTOMY N/A 08/28/2015   Procedure: Lumbar Four-Five decompressive lumbar laminectomy;  Surgeon: RJovita Gamma MD;  [facility];  Service: Neurosurgery;  Laterality: N/A;   REPLACEMENT TOTAL KNEE BILATERAL Bilateral 11/05/2003   RIGHT/LEFT HEART CATH AND CORONARY ANGIOGRAPHY N/A 03/28/2020   Procedure: RIGHT/LEFT HEART CATH AND CORONARY ANGIOGRAPHY;  Surgeon: MBurnell Blanks MD;  [facility];  Service: Cardiovascular;  Laterality: N/A;   TEE WITHOUT CARDIOVERSION N/A 02/28/2021   Procedure: TRANSESOPHAGEAL ECHOCARDIOGRAM (TEE);  Surgeon: PFreada Bergeron MD;  [facility];  Service: Cardiovascular;  Laterality: N/A;   TEE  WITHOUT CARDIOVERSION N/A 05/28/2021   Procedure: TRANSESOPHAGEAL ECHOCARDIOGRAM (TEE);  Surgeon: Werner Lean, MD;  Location: [facility] ENDOSCOPY;  Service: Cardiovascular;  Laterality: N/A;   THORACIC DISCECTOMY N/A 07/10/2020   Procedure: Spinal cord stimulator via  - Thoracic Eight-Thoracic Nine, Thoracic Nine-Thoracic Ten Laminectomy;  Surgeon: Eustace Moore, MD;  Location: [place];  Service: Neurosurgery;  Laterality: N/A;  3C   TOE AMPUTATION Right 2010   second toe   TONSILLECTOMY     TRANSCATHETER AORTIC VALVE REPLACEMENT,  TRANSFEMORAL N/A 02/10/2022   Procedure: Transcatheter Aortic Valve Replacement, Transfemoral;  Surgeon: Burnell Blanks, MD;  Location: [place] [facility];  Service: Open Heart Surgery;  Laterality: N/A;    Current Medications: Current Meds  Medication Sig   apixaban (ELIQUIS) 5 MG TABS tablet Take 1 tablet (5 mg total) by mouth 2 (two) times daily.   citalopram (CELEXA) 20 MG tablet Take 20 mg by mouth daily.   ezetimibe (ZETIA) 10 MG tablet Take 1 tablet (10 mg total) by mouth every evening.   fluticasone (FLONASE) 50 MCG/ACT nasal spray Place 1 spray into both nostrils daily as needed for allergies.    gabapentin (NEURONTIN) 600 MG tablet Take 600 mg by mouth every morning.   HYDROcodone-acetaminophen (NORCO) 7.5-325 MG tablet Take 1 tablet by mouth every 8 (eight) hours as needed for moderate pain.   JARDIANCE 10 MG TABS tablet Take 10 mg by mouth daily.   LORazepam (ATIVAN) 1 MG tablet Take 1 mg by mouth at bedtime.   losartan (COZAAR) 25 MG tablet Take 0.5 tablets (12.5 mg total) by mouth daily.   meclizine (ANTIVERT) 12.5 MG tablet Take 12.5 mg by mouth 3 (three) times daily as needed for dizziness.   metoprolol succinate (TOPROL-XL) 50 MG 24 hr tablet Take 1 tablet (50 mg total) by mouth daily. Take with or immediately following a meal.   Multiple Vitamin tablet Take 1 tablet by mouth daily.   omeprazole (PRILOSEC) 20 MG capsule Take 20 mg by mouth daily.   pravastatin (PRAVACHOL) 20 MG tablet Take 20 mg by mouth daily.   senna-docusate (SENOKOT-S) 8.6-50 MG tablet Take 1 tablet by mouth 2 (two) times daily.   tamsulosin (FLOMAX) 0.4 MG CAPS capsule Take 1 capsule (0.4 mg total) by mouth daily.   traZODone (DESYREL) 50 MG tablet Take 50 mg by mouth at bedtime.   triamcinolone (KENALOG) 0.1 % Apply 1 application topically daily as needed (irritation).   Current Facility-Administered Medications for the 05/05/22 encounter (Office Visit) with Werner Lean, MD   Medication   triamcinolone acetonide (KENALOG) 10 MG/ML injection 10 mg     Allergies:   Simvastatin, Atorvastatin, Benazepril hcl, Losartan, Pravastatin, Rosuvastatin, and Spironolactone   Social History   Socioeconomic History   Marital status: Married    Spouse name: Not on file   Number of children: 2   Years of education: Not on file   Highest education level: Not on file  Occupational History   Occupation: Customer service manager  Tobacco Use   Smoking status: Some Days    Types: Cigars   Smokeless tobacco: Never   Tobacco comments:    quit 1972- currently 1 cigar per week  Vaping Use   Vaping Use: Never used  Substance and Sexual Activity   Alcohol use: Yes    Comment: very occasionally beer/wine or scotch   Drug use: No    Comment: 1 cigar per week   Sexual activity: Not Currently  Other  Topics Concern   Not on file  Social History Narrative   Right handed   Lives with wife one story home   Social Determinants of Health   Financial Resource Strain: Not on file  Food Insecurity: Not on file  Transportation Needs: Not on file  Physical Activity: Not on file  Stress: Not on file  Social Connections: Not on file    Social: Wife Comes to visit and takes notes for him, Former Teacher, English as a foreign language of [employer]; in 2022 65th wedding anniversary (2022), former high school quarterback   Family History: The patient's family history includes Hypertension in an other family member; Stroke in his mother.  ROS:   Please see the history of present illness.    All other systems reviewed and are negative.  EKGs/Labs/Other Studies Reviewed:    The following studies were reviewed today:   EKG:   05/05/22: Sinus tachycardia with frequent PACs, occasional PVCs, RBBB with LAFB, Qtc 490 ms, Jtc 370 ms 02/21/20:  Sinus Rhythm Rate 84 with Rare PVCs, and PACs, QTc 514 ms  Cardiac Studies & Procedures   CARDIAC CATHETERIZATION  CARDIAC CATHETERIZATION 03/28/2020  Narrative  Prox Cx  lesion is 30% stenosed.  Prox LAD to Mid LAD lesion is 60% stenosed.  Dist LAD lesion is 60% stenosed.  1. The LAD is a large vessel that courses to the apex. Moderate, heavily calcified proximal to mid stenosis that does is eccentric and does not appear to flow limiting. Focal mid to distal moderate stenosis. 2. Moderate disease in the intermediate branch 3. Mild plaque in the Circumflex 4. Large, dominant RCA with no obstructive disease 5. Low flow/low gradient moderate to severe aortic stenosis (Cath data: Mean gradient 16.67mHg, peak to peak gradient 16 mmHg). By echo his dimensionless index is 0.28 and SVI is 40.  Recommendations: He appears to have low flow/low gradient moderate to severe aortic stenosis. He has moderate CAD but in the absence of angina, would not treat the calcific disease in the proximal and mid LAD. He is asymptomatic at this time. Will review results with Dr. CGasper Sellsand will decide if we should proceed with workup for TAVR at this time.  Findings Coronary Findings Diagnostic  Dominance: Right  Left Anterior Descending Vessel is large. Prox LAD to Mid LAD lesion is 60% stenosed. The lesion is eccentric. The lesion is calcified. Dist LAD lesion is 60% stenosed.  Left Circumflex Prox Cx lesion is 30% stenosed.  Right Coronary Artery Vessel is large.  Intervention  No interventions have been documented.     ECHOCARDIOGRAM  ECHOCARDIOGRAM COMPLETE 03/20/2022  Narrative ECHOCARDIOGRAM REPORT    Patient Name:   Ruben Barrera of Exam: 03/20/2022 Medical Rec #:  [id number]     Height:       67.0 in Accession #:    [id number]    Weight:       176.4 lb Date of Birth:  [birth year]      BSA:          1.917 m Patient Age:    89years       BP:           109/63 mmHg Patient Gender: M              HR:           44 bpm. [facility] [facility] Procedure: 2D Echo, Cardiac Doppler and Color Doppler  Indications:    Z95.2  TAVR  History:        Patient has prior history of Echocardiogram examinations. NICM/ Progressive LV dysfunction. Severe low flow low gradient AS, CAD, Stroke, Endocarditis and Mitral Valve Disease, Arrythmias:RBBB, LAFB and PVC; Risk Factors:Hypertension. Aortic Valve: 26 mm Edwards Sapien 3 Ultra Resilia valve is present in the aortic position. Procedure Date: 01/2022.  Sonographer:    Lenard Galloway BA, RDCS Referring Phys: [place]   1. Left ventricular ejection fraction, by estimation, is 20 to 25%. The left ventricle has severely decreased function. The left ventricle demonstrates global hypokinesis. There is moderate left ventricular hypertrophy. Left ventricular diastolic parameters are consistent with Grade I diastolic dysfunction (impaired relaxation). 2. Right ventricular systolic function is moderately reduced. The right ventricular size is normal. 3. Left atrial size was severely dilated. 4. The mitral valve is abnormal. Mild mitral valve regurgitation. 5. The aortic valve has been repaired/replaced. Aortic valve regurgitation is not visualized. There is a 26 mm Edwards Sapien 3 Ultra Resilia valve present in the aortic position. Procedure Date: 01/2022. Echo findings are consistent with normal structure and function of the aortic valve prosthesis. Aortic valve area, by VTI measures 1.80 cm. Aortic valve mean gradient measures 11.4 mmHg. Aortic valve Vmax measures 2.17 m/s. Peak gradient 18.8 mmHg, DI 0.34. 6. Rhythm strip during this exam demonstrates sinus tachycardia and premature ventricular contractions.  Comparison(s): Changes from prior study are noted. 12/02/2021: LVEF 20-25%, severe AS.  FINDINGS Left Ventricle: Left ventricular ejection fraction, by estimation, is 20 to 25%. The left ventricle has severely decreased function. The left ventricle demonstrates global hypokinesis. The left ventricular internal cavity size was normal in size.  There is moderate left ventricular hypertrophy. Left ventricular diastolic parameters are consistent with Grade I diastolic dysfunction (impaired relaxation). Indeterminate filling pressures.  Right Ventricle: The right ventricular size is normal. No increase in right ventricular wall thickness. Right ventricular systolic function is moderately reduced.  Left Atrium: Left atrial size was severely dilated.  Right Atrium: Right atrial size was normal in size.  Pericardium: There is no evidence of pericardial effusion.  Mitral Valve: The mitral valve is abnormal. There is mild calcification of the anterior and posterior mitral valve leaflet(s). Mild mitral valve regurgitation.  Tricuspid Valve: The tricuspid valve is grossly normal. Tricuspid valve regurgitation is mild.  Aortic Valve: The aortic valve has been repaired/replaced. Aortic valve regurgitation is not visualized. Aortic valve mean gradient measures 11.4 mmHg. Aortic valve peak gradient measures 18.8 mmHg. Aortic valve area, by VTI measures 1.80 cm. There is a 26 mm Edwards Sapien 3 Ultra Resilia valve present in the aortic position. Procedure Date: 01/2022. Echo findings are consistent with normal structure and function of the aortic valve prosthesis.  Pulmonic Valve: The pulmonic valve was grossly normal. Pulmonic valve regurgitation is mild.  Aorta: The aortic root and ascending aorta are structurally normal, with no evidence of dilitation.  Venous: The inferior vena cava was not well visualized.  IAS/Shunts: No atrial level shunt detected by color flow Doppler.  EKG: Rhythm strip during this exam demonstrates sinus tachycardia and premature ventricular contractions.   LEFT VENTRICLE PLAX 2D LVIDd:         4.70 cm      Diastology LVIDs:         4.67 cm      LV e' medial:   4.49 cm/s LV PW:         1.30 cm      LV E/e'  medial: 13.5 LV IVS:        1.30 cm LVOT diam:     2.60 cm LV SV:         86 LV SV Index:   45 LVOT  Area:     5.31 cm  LV Volumes (MOD) LV vol d, MOD A2C: 249.2 ml LV vol d, MOD A4C: 132.3 ml LV vol s, MOD A2C: 100.4 ml LV vol s, MOD A4C: 145.6 ml LV SV MOD A2C:     148.8 ml LV SV MOD A4C:     132.3 ml LV SV MOD BP:      59.9 ml  RIGHT VENTRICLE RV Basal diam:  4.10 cm RV Mid diam:    4.10 cm RV S prime:     14.94 cm/s TAPSE (M-mode): 2.4 cm RVSP:           26.6 mmHg  LEFT ATRIUM              Index        RIGHT ATRIUM           Index LA diam:        6.10 cm  3.18 cm/m   RA Pressure: 3.00 mmHg LA Vol (A2C):   166.0 ml 86.59 ml/m  RA Area:     10.70 cm LA Vol (A4C):   129.0 ml 67.29 ml/m  RA Volume:   22.50 ml  11.74 ml/m LA Biplane Vol: 148.0 ml 77.20 ml/m AORTIC VALVE AV Area (Vmax):    1.80 cm AV Area (Vmean):   1.85 cm AV Area (VTI):     1.80 cm AV Vmax:           216.60 cm/s AV Vmean:          157.400 cm/s AV VTI:            0.477 m AV Peak Grad:      18.8 mmHg AV Mean Grad:      11.4 mmHg LVOT Vmax:         73.28 cm/s LVOT Vmean:        54.980 cm/s LVOT VTI:          0.162 m LVOT/AV VTI ratio: 0.34  AORTA Ao Root diam: 2.70 cm Ao Asc diam:  3.40 cm  MITRAL VALVE               TRICUSPID VALVE MV Area (PHT): 2.66 cm    TR Peak grad:   23.6 mmHg MV Decel Time: 285 msec    TR Vmax:        243.00 cm/s MV E velocity: 60.53 cm/s  Estimated RAP:  3.00 mmHg MV A velocity: 64.10 cm/s  RVSP:           26.6 mmHg MV E/A ratio:  0.94 SHUNTS Systemic VTI:  0.16 m Systemic Diam: 2.60 cm  Lyman Bishop MD Electronically signed by Lyman Bishop MD Signature Date/Time: 03/20/2022/11:02:19 AM    Final   TEE  ECHO TEE 05/28/2021  Narrative TRANSESOPHOGEAL ECHO REPORT    Patient Name:   STEVYN MADDEN Date of Exam: 05/28/2021 Medical Rec #:  [id number]      Height:       69.0 in Accession #:    [id number]     Weight:       179.0 lb Date of Birth:  [birth year]       BSA:          1.971 m Patient Age:  88 years       BP:           135/73 mmHg Patient  Gender: M              HR:           75 bpm. Exam Location:  Inpatient  Procedure: 3D Echo, Transesophageal Echo, Cardiac Doppler and Color Doppler  Indications:     Endocarditis  History:         Patient has prior history of Echocardiogram examinations.  Sonographer:     Roseanna Rainbow RDCS Referring Phys:  [id number] [facility] A Gasper Sells Diagnosing Phys: Rudean Haskell MD  PROCEDURE: After discussion of the risks and benefits of a TEE, an informed consent was obtained from the patient. The transesophogeal probe was passed without difficulty through the esophogus of the patient. Imaged were obtained with the patient in a left lateral decubitus position. Sedation performed by different physician. The patient was monitored while under deep sedation. Anesthestetic sedation was provided intravenously by Anesthesiology: 121m of Propofol. The patient developed no complications during the procedure.  IMPRESSIONS   1. Left ventricular ejection fraction, by estimation, is 30%. Left ventricular ejection fraction by 3D volume is 28 %. The left ventricle has severely decreased function. The left ventricle demonstrates global hypokinesis. The left ventricular internal cavity size was mildly dilated. Left ventricular diastolic function could not be evaluated. 2. Right ventricular systolic function is normal. The right ventricular size is normal. 3. Left atrial size was moderately dilated. No left atrial/left atrial appendage thrombus was detected. The LAA emptying velocity was 47 cm/s. 4. Right atrial size was mildly dilated. 5. The mitral valve is degenerative. There are two small echodensities of atrial surface of the mitral valve. Differential in the setting of prior infective endocarditis includes small healed vegetations. Mild MR on this study. 6. The aortic valve is tricuspid. Aortic valve regurgitation is not visualized. Aortic valve area, by VTI measures 1.46 cm. Aortic valve mean gradient  measures 12.0 mmHg. DVI 0.4 at suboptimal angles; likely moderate aortic stenosis. 7. There is mild (Grade II) plaque involving the descending aorta.  Comparison(s): Similar ro prior with likely healed vegetations and no new infective endocarditis.  FINDINGS Left Ventricle: Left ventricular ejection fraction, by estimation, is 30%. Left ventricular ejection fraction by 3D volume is 28 %. The left ventricle has severely decreased function. The left ventricle demonstrates global hypokinesis. The left ventricular internal cavity size was mildly dilated. There is no left ventricular hypertrophy. Left ventricular diastolic function could not be evaluated.  Right Ventricle: The right ventricular size is normal. Right vetricular wall thickness was not assessed. Right ventricular systolic function is normal.  Left Atrium: Left atrial size was moderately dilated. No left atrial/left atrial appendage thrombus was detected. The LAA emptying velocity was 47 cm/s.  Right Atrium: Right atrial size was mildly dilated.  Pericardium: There is no evidence of pericardial effusion.  Mitral Valve: The mitral valve is degenerative in appearance. Trivial mitral valve regurgitation.  Tricuspid Valve: The tricuspid valve is normal in structure. Tricuspid valve regurgitation is trivial.  Aortic Valve: Aortic valve area 1.98 cm2. The aortic valve is tricuspid. Aortic valve regurgitation is not visualized. Aortic valve mean gradient measures 12.0 mmHg. Aortic valve peak gradient measures 17.5 mmHg. Aortic valve area, by VTI measures 1.46 cm.  Pulmonic Valve: The pulmonic valve was not well visualized. Pulmonic valve regurgitation is not visualized.  Aorta: The aortic root and ascending aorta are structurally normal, with no  evidence of dilitation. There is mild (Grade II) plaque involving the descending aorta.  IAS/Shunts: No atrial level shunt detected by color flow Doppler.   LEFT VENTRICLE PLAX 2D LVOT  diam:     2.20 cm LV SV:         60 LV SV Index:   31              3D Volume EF LVOT Area:     3.80 cm        LV 3D EF:    Left ventricul ar ejection fraction by 3D volume is 28 %.  3D Volume EF LV 3D EF:    28.30 % LV 3D EDV:   153900.00 mm LV 3D ESV:   110400.00 mm LV 3D SV:    43500.00 mm  3D Volume EF: 3D EF:        28 %  AORTIC VALVE AV Area (Vmax):    1.89 cm AV Area (Vmean):   1.50 cm AV Area (VTI):     1.46 cm AV Vmax:           209.00 cm/s AV Vmean:          150.500 cm/s AV VTI:            0.414 m AV Peak Grad:      17.5 mmHg AV Mean Grad:      12.0 mmHg LVOT Vmax:         103.80 cm/s LVOT Vmean:        59.250 cm/s LVOT VTI:          0.159 m LVOT/AV VTI ratio: 0.38  AORTA Ao Asc diam: 3.00 cm   SHUNTS Systemic VTI:  0.16 m Systemic Diam: 2.20 cm  Rudean Haskell MD Electronically signed by Rudean Haskell MD Signature Date/Time: 05/28/2021/4:30:53 PM    Final   MONITORS  LONG TERM MONITOR-LIVE TELEMETRY (3-14 DAYS) 03/18/2022  Narrative   Patient had a minimum heart rate of 51 bpm, maximum heart rate of 182 bpm, and average heart rate of 80 bpm.   Predominant underlying rhythm was sinus rhythm.   There is wide complex tachycardia- suspect SVT with aberrancy and atrial fibrillation as RBBB morphology persists.   Atrial Fibrillation occurred, ranging from 58-181 bpm (avg of 100 bpm), the longest lasting 16 hours 34 mins with an average rate of 100 bpm.   Isolated PACs were rare (<1.0%).   Isolated PVCs were frequent (20%).   No evidence of significant heart block.   Triggered and diary events associated with sinus rhythm, PACs, and PVCs.  New atrial fibrillation without heart block.   CT SCANS  CT CORONARY MORPH W/CTA COR W/SCORE 01/02/2022  Addendum 01/02/2022  2:58 PM ADDENDUM REPORT: 01/02/2022 14:56  CLINICAL DATA:  Aortic Valve pathology with assessment for TAVR  EXAM: Cardiac TAVR CT  TECHNIQUE: The patient  was scanned on a Siemens Force [id number] slice scanner. A 120 kV retrospective scan was triggered in the descending thoracic aorta at 111 HU's. Gantry rotation speed was 270 msecs and collimation was .9 mm. No beta blockade or nitro were given. The 3D data set was reconstructed in 5% intervals of the R-R cycle. Systolic and diastolic phases were analyzed on a dedicated work station using MPR, MIP and VRT modes. The patient received 100 cc of contrast.  FINDINGS: Aortic Valve: Severely thickened aortic valve with heavy calcification and reduced excursion the planimeter valve area is 1.07 Sq cm consistent with moderate to severe  aortic stenosis  Number of leaflets: 3  LVOT calcification: None  Annular calcification: Mild  Aortic Valve Calcium Score: 2025  Presence of severe basal septal hypertrophy: no  Perimembranous septal diameter: 9.6  Mm  Mitral Valve: Moderate scattered mitral annular calcification  Aortic Annulus Measurements- 30%  Major annulus diameter: 30 mm  Minor annulus diameter:25 mm  Annular perimeter: 84 mm  Annular area: 5.48 cm2  Aortic Root Measurements  Sinotubular Junction: 28 mm  Ascending Thoracic Aorta: 30 mm  Aortic Arch: 29 mm  Aortic atherosclerosis.  Sinus of Valsalva Measurements:  Right coronary cusp width: 31 mm  Left coronary cusp width: 32 mm  Non coronary cusp width: 32 mm  Coronary Artery Height above Annulus:  Left Main: 15 mm  Left SoV height: 23 mm  Right Coronary: 17 mm  Right SoV height: 22 mm  Optimum Fluoroscopic Angle for Delivery: RAO 20 CAU 14  Cusp overlap view angle: RAO 0 CRA 17  Valves for structural team consideration: 29 mm Sapien Valve (Measurements on the lower end for a 29 mm Valve, LVOT is tubular and does not narrow)  Sufficient sinus measurements for a 34 mm Evolute Valve  Non TAVR Valve Findings:  Coronary Arteries: Normal coronary origin. Study not completed with nitroglycerin.  Coronary  Calcium Score:  Left main: 0  Left anterior descending artery: 578  Left circumflex artery: 552  Right coronary artery: 545  Total: 1675  Systemic veins: Normal anatomy  Main Pulmonary artery: Severely dilated 35 mm  Pulmonary veins: Normal anatomy  Left atrial appendage: Patent  Interatrial septum: No communications  Left ventricle: Normal size  Left atrium: Dilated  Right ventricle: Normal size  Right atrium: Normal size  Pericardium: No calcifications  Extra Cardiac Findings as per separate reporting.  Notable artifacts: Slab artifact from patient motion  IMPRESSION: 1. Severe Aortic stenosis. Findings pertinent to TAVR procedure are detailed above.  RECOMMENDATIONS:  The proposed cut-off value of 1,651 AU yielded a 93 % sensitivity and 75 % specificity in grading AS severity in patients with classical low-flow, low-gradient AS. Proposed different cut-off values to define severe AS for men and women as 2,065 AU and 1,274 AU, respectively. The joint European and American recommendations for the assessment of AS consider the aortic valve calcium score as a continuum - a very high calcium score suggests severe AS and a low calcium score suggests severe AS is unlikely.  Kerman Passey, et al. 2017 ESC/EACTS Guidelines for the management of valvular heart disease. Eur Heart J [phone]  Coronary artery calcium (CAC) score is a strong predictor of incident coronary heart disease (CHD) and provides predictive information beyond traditional risk factors. CAC scoring is reasonable to use in the decision to withhold, postpone, or initiate statin therapy in intermediate-risk or selected borderline-risk asymptomatic adults (age 73-75 years and LDL-C >=70 to <190 mg/dL) who do not have diabetes or established atherosclerotic cardiovascular disease (ASCVD).* In intermediate-risk (10-year ASCVD risk >=7.5% to <20%) adults or selected  borderline-risk (10-year ASCVD risk >=5% to <7.5%) adults in whom a CAC score is measured for the purpose of making a treatment decision the following recommendations have been made:  If CAC = 0, it is reasonable to withhold statin therapy and reassess in 5 to 10 years, as long as higher risk conditions are absent (diabetes mellitus, family history of premature CHD in first degree relatives (males <55 years; females <65 years), cigarette smoking, LDL >=190 mg/dL or other independent risk  factors).  If CAC is 1 to 99, it is reasonable to initiate statin therapy for patients >=26 years of age.  If CAC is >=100 or >=75th percentile, it is reasonable to initiate statin therapy at any age.  Cardiology referral should be considered for patients with CAC scores >=400 or >=75th percentile.  *2018 AHA/ACC/AACVPR/AAPA/ABC/ACPM/ADA/AGS/APhA/ASPC/NLA/PCNA Guideline on the Management of Blood Cholesterol: A Report of the American College of Cardiology/American Heart Association Task Force on Clinical Practice Guidelines. J Am Coll Cardiol. 2019;73(24):3168-3209.  Erico Stan   Electronically Signed By: Rudean Haskell M.D. On: 01/02/2022 14:56  Narrative EXAM: OVER-READ INTERPRETATION  CT CHEST  The following report is a limited chest CT over-read performed by radiologist Dr. Yetta Glassman of [facility], [place] on 01/02/2022. This over-read does not include interpretation of cardiac or coronary anatomy or pathology. The cardiac TAVR interpretation by the cardiologist is attached.  COMPARISON:  None Available.  FINDINGS: Extracardiac findings will be described separately under dictation for contemporaneously obtained CTA chest, abdomen and pelvis.  IMPRESSION: Please see separate dictation for contemporaneously obtained CTA chest, abdomen and pelvis dated 01/02/2022 for full description of relevant extracardiac findings.  Electronically Signed: By:  Yetta Glassman M.D. On: 01/02/2022 12:42   CARDIAC MRI  MR CARDIAC MORPHOLOGY W WO CONTRAST 05/01/2020  Narrative CLINICAL DATA:  Clinical question of Scar in the setting of premature ventricular contractions 89 year old White Male  EXAM: CARDIAC MRI  TECHNIQUE: The patient was scanned on a 1.5 Tesla GE magnet. A dedicated cardiac coil was used. Functional imaging was done using Fiesta sequences. 2,3, and 4 chamber views were done to assess for RWMA's. Modified Simpson's rule using a short axis stack was used to calculate an ejection fraction on a dedicated work Conservation officer, nature. The patient received 8 cc of Gadavist. After 10 minutes inversion recovery sequences were used to assess for infiltration and scar tissue.  CONTRAST:  8 cc  of Gadavist  FINDINGS: 1.  Moderate left ventricular dilation, with LVEDD 59 mm.  Normal left ventricular thickness, with intraventricular septal thickness of 10 mm, posterior wall thickness of 9 mm, and septal to posterior ratio < 1.5.  The myocardial mass index is normal at 78 g/m2.  Qualitatively, the left ventricle has moderately decreased function, left ventricular ejection fraction appears to be 30%. There appears to be global hypokinesis, sensitivity of this assessment decreased with artifact.  Left ventricular parametric mapping deferred in the setting of substantial cardiac motion artifact.  There is no late gadolinium enhancement in the left ventricular myocardium.  2. Normal right ventricular size with RV Basal Diameter 41 mm.  Normal right ventricular thickness.  Right ventricular function appears to be qualitatively within normal limits, sensitivity of this assessment decreased with artifact.  3. Normal right atrial size (indexed volume 18 ml/m2) and severe left atrial dilation (indexed volume 48.32 ml/m2).  4. Normal size of the aortic root, ascending aorta and pulmonary artery.  5. Velocity encoding  artifact noted surround the aortic valve may suggest some degree of aortic stenosis. Significant tricuspid, pulmonic, or aortic regurgitation not visualized. There appears to be mild mitral regurgitation qualitatively, sensitivity of this assessment decreased with artifact.  6.  Normal pericardium.  No pericardial effusion.  7. Grossly, no extracardiac findings. Recommended dedicated study if concerned for non-cardiac pathology.  8. Significant cardiac motion artifact. Suspect that both premature ventricular contractions and breath hold artifact contribute.  IMPRESSION: 1. Moderate left ventricular dilation with moderately decreased function,  LVEF approximated to 30%. No late gadolinium enhancement appreciated.  2.  Grossly normal RV function.  3.  Severe left atrial dilation.  4.  Velocity encoding artifact consistent with aortic stenosis  5.  Significant cardiac motion artifact.  Rudean Haskell MD   Electronically Signed By: Rudean Haskell MD On: 05/01/2020 09:43          Recent Labs: 10/17/2021: B Natriuretic Peptide 1,366.3 02/11/2022: ALT 12; Hemoglobin 15.0; Magnesium 2.2; Platelets 165 02/18/2022: BUN 25; Creatinine, Ser 1.18; Potassium 4.6; Sodium 144  Recent Lipid Panel    Component Value Date/Time   CHOL 204 (H) 02/21/2020 0253   TRIG 199 (H) 02/21/2020 0253   HDL 42 02/21/2020 0253   CHOLHDL 4.9 02/21/2020 0253   VLDL 40 02/21/2020 0253   LDLCALC 122 (H) 02/21/2020 0253     Risk Assessment/Calculations:    CHA2DS2-VASc Score = 7   This indicates a 11.2% annual risk of stroke. The patient's score is based upon: CHF History: 1 HTN History: 1 Diabetes History: 0 Stroke History: 2 Vascular Disease History: 1 Age Score: 2 Gender Score: 0     HYPERTENSION CONTROL Vitals:   05/05/22 0906 05/05/22 1012  BP: (!) 150/82 (!) 154/70    The patient's blood pressure is elevated above target today.  In order to address the patient's  elevated BP: A new medication was prescribed today.            Physical Exam:    VS:  BP (!) 154/70   Pulse 86   Ht 5' 7"$  (1.702 m)   Wt 175 lb 3.2 oz (79.5 kg)   SpO2 95%   BMI 27.44 kg/m     Wt Readings from Last 3 Encounters:  05/05/22 175 lb 3.2 oz (79.5 kg)  03/20/22 180 lb (81.6 kg)  02/18/22 176 lb 6.4 oz (80 kg)    GEN:  Elderly male in no acute distress HEENT: Normal NECK: No JVD CARDIAC: IRIR, no rubs, gallops; systolic murmur RESPIRATORY:  Clear to auscultation without rales, wheezing or rhonchi  ABDOMEN: Soft, non-tender, non-distended MUSCULOSKELETAL:  No edema; No deformity  SKIN: Warm and dry NEUROLOGIC:  Alert and oriented x 3 PSYCHIATRIC:  Normal affect   ASSESSMENT:    1. HFrEF (heart failure with reduced ejection fraction) ([place])   2. Medication management   3. Chronic systolic congestive heart failure ([place])   4. Frequent PVCs   5. SVT (supraventricular tachycardia)   6. Coronary artery disease involving native coronary artery of native heart without angina pectoris   7. Bacterial endocarditis, unspecified chronicity   8. Coronary artery disease due to lipid rich plaque   9. Severe aortic valve stenosis   10. S/P TAVR (transcatheter aortic valve replacement)   11. Tobacco abuse   12. PAF (paroxysmal atrial fibrillation) (H[facility])    PLAN:     Severe AS s/p TAVR  Heart Failure Reduced Ejection Fraction (combined systolic and diastolic) - chronic - with frequent PVCs - NYHA class I-II, Stage C, euvolemic, etiology from PVCs on DDX; he would be appropriate for cardiac amyloidosis eval (RBBB, Age over 74, AS, LVEF without robust response to GDMT) - Diuretic regimen: Lasix 20 mg - continue SGLT2i - he may not tolerate further AV nodal agents given his conduction disease, continue current beta blocker - This is the highest his BP as been since 2022; I would trial low dose ARB 12.5 mg PO BID; BMP in one week and AMB BP checks at home -  if BP tolerate  ARB or ARNI titration, would then re-challenge MRA - if this fails to improved LVEF, would get PYP scan looking for ATTR (vs free breathing CMR, he has issues with his prior that lead to motion artifact) - if this fails to improve LVEF would start amiodarone for PVC supression and send to EP - if EF is refractory to all of this would consider EP evaluation (I suspect CRT-P would not do well without PVC control - We are happy to send his results to [facility]; I have an old colleague in [place] ([place]) named Delia Heady- she is no longer seeing patients but has a good network of patient there and would be happy to set him up with a good team in [place] there are other places closer to his new location that we have discussed as well   Pulmonary nodule  Lymphadenopathy 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 Asymptomatic. CHADSVACS score of 7 with an 11.2% annual stroke risk on Eliquis 52m BID.  - Continue BB as above     CAD Anatomy: moderate calcified CAD (50-60% proximal to mid LAD stenosis, 50-60% distal LAD stenosis, 50% intermediate branch stenosis, 30% proximal Circumflex stenosis). Continue medical therapy.  Asymptomatic - with HLD and statin myalgia - on Zetia and highest tolerated statin (pravastatin 20 mg) - no ASA because of DOAC  History of Health Mitral Valve endocarditis (late 2022) - PRN antibiotics prior to dental procedures   PRN with our group unless something keeps [place]  Time Spent Directly with Patient:   I have spent a total of 60 minutes with the patient reviewing notes, imaging, EKGs, labs and examining the patient as well as establishing an assessment and plan that was discussed personally with the patient.  > 50% of time was spent in direct  patient care  family and reviewing imaging with patient.         Medication Adjustments/Labs and Tests Ordered: Current medicines are reviewed at length with the patient today.  Concerns regarding medicines are outlined above.  Orders Placed This Encounter  Procedures   Basic metabolic panel   EKG [id number]  Meds ordered this encounter  Medications   losartan (COZAAR) 25 MG tablet    Sig: Take 0.5 tablets (12.5 mg total) by mouth daily.    Dispense:  45 tablet    Refill:  3    Patient Instructions  Medication Instructions:  Your physician has recommended you make the following change in your medication:   ** Begin Losartan 24m- 1/2 tablet (12.58m26mby mouth daily  *If you need a refill on your cardiac medications before your next appointment, please call your pharmacy*   Lab Work: BMET 1 week If you have labs (blood work) drawn today and your tests are completely normal, you will receive your results only by: MyCAnnaf you have MyChart) OR A paper copy in the mail If you have any lab test that is abnormal or we need to change your treatment, we will call you to review the results.   Testing/Procedures: None ordered.    Follow-Up: [facility] and your [facility] needs are our priority.  As part of our continuing mission  to provide you with exceptional heart care, we have created designated Provider Care Teams.  These Care Teams include your primary Cardiologist (physician) and Advanced Practice Providers (APPs -  Physician Assistants and Nurse Practitioners) who all work together to provide you with the care you need, when you need it.  We recommend signing up for the patient portal called "MyChart".  Sign up information is provided on this After Visit Summary.  MyChart is used to connect with patients for Virtual Visits (Telemedicine).  Patients are able to view lab/test results, encounter notes, upcoming appointments, etc.  Non-urgent messages  can be sent to your provider as well.   To learn more about what you can do with MyChart, go to [email or web address].    Your next appointment:   Follow up with Dr Gasper Sells as needed.   Signed, Werner Lean, MD  05/05/2022 10:12 AM    [place]

## 2022-05-05 NOTE — Patient Instructions (Signed)
Medication Instructions:  Your physician has recommended you make the following change in your medication:   ** Begin Losartan 45m - 1/2 tablet (12.553m by mouth daily  *If you need a refill on your cardiac medications before your next appointment, please call your pharmacy*   Lab Work: BMET 1 week If you have labs (blood work) drawn today and your tests are completely normal, you will receive your results only by: MyBellows Fallsif you have MyChart) OR A paper copy in the mail If you have any lab test that is abnormal or we need to change your treatment, we will call you to review the results.   Testing/Procedures: None ordered.    Follow-Up: [facility] are our [facility].  As part of our continuing mission to provide you with exceptional heart care, we have created designated Provider Care Teams.  These Care Teams include your primary Cardiologist (physician) and Advanced Practice Providers (APPs -  Physician Assistants and Nurse Practitioners) who all work together to provide you with the care you need, when you need it.  We recommend signing up for the patient portal called "MyChart".  Sign up information is provided on this After Visit Summary.  MyChart is used to connect with patients for Virtual Visits (Telemedicine).  Patients are able to view lab/test results, encounter notes, upcoming appointments, etc.  Non-urgent messages can be sent to your provider as well.   To learn more about what you can do with MyChart, go to [email or web address]   Your next appointment:   Follow up with Dr ChGasper Sellss needed.

## 2022-05-06 ENCOUNTER — Ambulatory Visit (INDEPENDENT_AMBULATORY_CARE_PROVIDER_SITE_OTHER): Payer: PPO | Admitting: Podiatry

## 2022-05-06 ENCOUNTER — Encounter: Payer: Self-pay | Admitting: Podiatry

## 2022-05-06 DIAGNOSIS — B351 Tinea unguium: Secondary | ICD-10-CM

## 2022-05-06 DIAGNOSIS — G629 Polyneuropathy, unspecified: Secondary | ICD-10-CM

## 2022-05-06 DIAGNOSIS — M79674 Pain in right toe(s): Secondary | ICD-10-CM

## 2022-05-06 DIAGNOSIS — R2681 Unsteadiness on feet: Secondary | ICD-10-CM | POA: Diagnosis not present

## 2022-05-06 DIAGNOSIS — M79675 Pain in left toe(s): Secondary | ICD-10-CM | POA: Diagnosis not present

## 2022-05-07 NOTE — Progress Notes (Signed)
**Note Ruben-Identified via Obfuscation** Subjective:   Patient ID: Ruben Barrera, male   DOB: 89 y.o.   MRN: [id number]   HPI Patient presents stating he needs his nails taken care of today and he is also moving to [place] and wants to know if we have any recommendations and also he is concerned about increased gait instability that is occurring bilateral   ROS      Objective:  Physical Exam  Neurovascular status found to be intact with patient noted to have diminishment of sharp dull vibratory but did have also diminished range of motion subtalar midtarsal joint.  I noted thick yellow brittle nailbeds 1-5 both feet that do get incurvated in the corners and they are bothersome for him     Assessment:  Increased gait instability bilateral with advanced age with mycotic nail infection pain 1-5 both feet     Plan:  H&P reviewed condition and at this point debrided nailbeds 1-5 both feet.  Discussed gait instability discussed possible balance bracing or other modalities that could be considered and discussed physical therapy that he can do when he is in [place].  Patient will be seen back as needed but is moving currently and can call me if he has any issues

## 2022-05-12 ENCOUNTER — Ambulatory Visit: Payer: PPO | Attending: Internal Medicine

## 2022-05-12 DIAGNOSIS — I5020 Unspecified systolic (congestive) heart failure: Secondary | ICD-10-CM

## 2022-05-12 DIAGNOSIS — Z79899 Other long term (current) drug therapy: Secondary | ICD-10-CM

## 2022-05-13 LAB — BASIC METABOLIC PANEL
BUN/Creatinine Ratio: 18 (ref 10–24)
BUN: 23 mg/dL (ref 8–27)
CO2: 25 mmol/L (ref 20–29)
Calcium: 9.8 mg/dL (ref 8.6–10.2)
Chloride: 99 mmol/L (ref 96–106)
Creatinine, Ser: 1.31 mg/dL — ABNORMAL HIGH (ref 0.76–1.27)
Glucose: 98 mg/dL (ref 70–99)
Potassium: 4.7 mmol/L (ref 3.5–5.2)
Sodium: 141 mmol/L (ref 134–144)
eGFR: 53 mL/min/{1.73_m2} — ABNORMAL LOW (ref >59)

## 2022-05-14 ENCOUNTER — Ambulatory Visit: Payer: PPO | Admitting: Physical Therapy

## 2022-05-14 ENCOUNTER — Ambulatory Visit: Payer: PPO | Attending: Internal Medicine | Admitting: Physical Therapy

## 2022-05-14 DIAGNOSIS — R2689 Other abnormalities of gait and mobility: Secondary | ICD-10-CM | POA: Diagnosis present

## 2022-05-14 DIAGNOSIS — R2681 Unsteadiness on feet: Secondary | ICD-10-CM | POA: Insufficient documentation

## 2022-05-15 ENCOUNTER — Encounter: Payer: Self-pay | Admitting: Physical Therapy

## 2022-05-15 IMAGING — RF DG THORACIC SPINE 1V
1 series · 1 of 1 positions shown · non-contrast
Comparison: Thoracic spine MRI 05/22/2020.

CLINICAL DATA: Provided history: Surgery, elective. Additional
history provided: Thoracic 8-thoracic 9, thoracic 9-thoracic 10
laminectomy. Provided fluoroscopy time 16.8 seconds (10.45 mGy).

EXAM:
OPERATIVE THORACIC SPINE single VIEW

[Series 1: run · 1 of 1 slices shown]
[im 1/1]
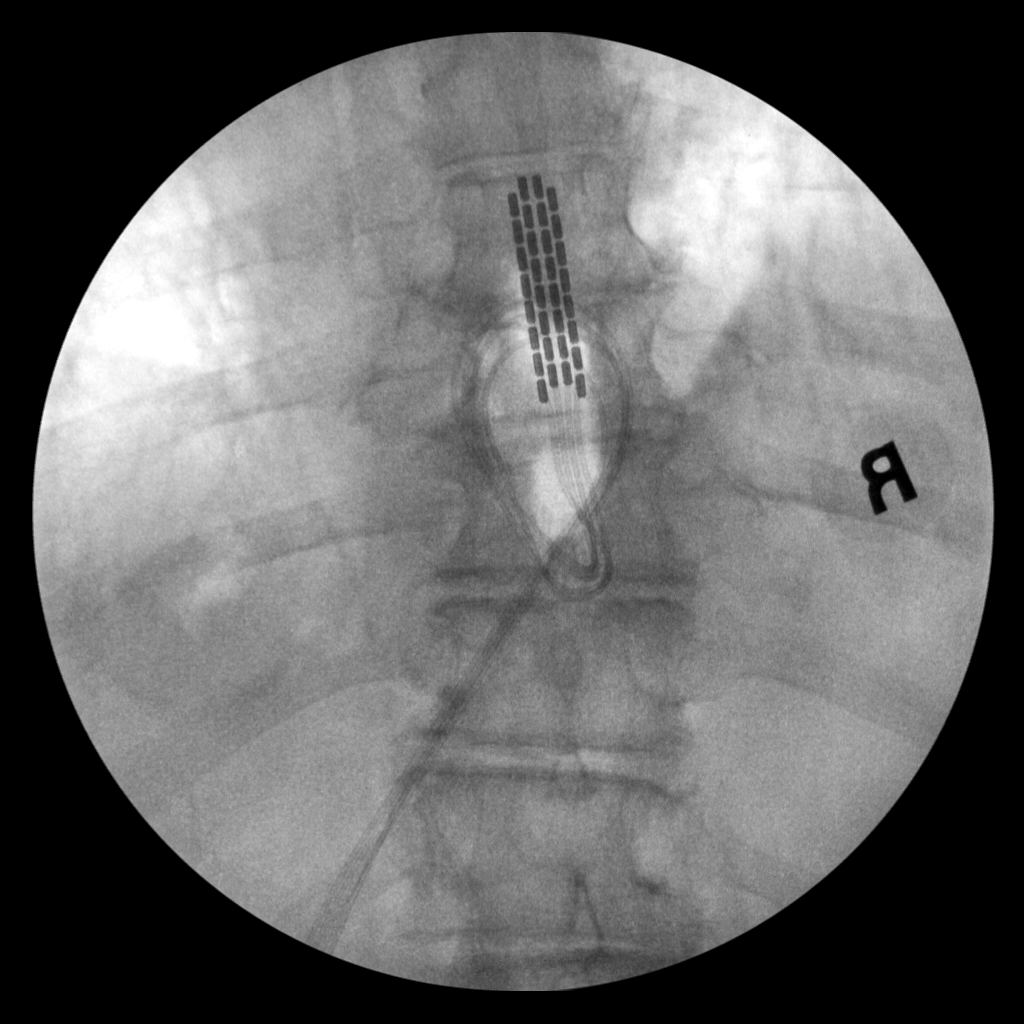

[1 of 1 positions shown; findings below may reference images not displayed]

FINDINGS: A single AP intraoperative fluoroscopic image of the thoracic spine
is submitted. On the provided image, spinal cord stimulator leads
project over the thoracic spinal canal. The leads have a somewhat
tortuous course. The level of lead termination cannot be determined
given the provided field of view.
IMPRESSION: Single AP intraoperative fluoroscopic image of the thoracic spine
from reported T8-T9 and T9-T10 laminectomy with spinal cord
stimulator placement, as described.

## 2022-05-15 IMAGING — CR DG CHEST 2V
2 series · 2 of 2 positions shown · non-contrast
Comparison: Chest x-ray 02/20/2020.  CT neck 06/20/2019.

CLINICAL DATA: Preoperative exam.

EXAM:
CHEST - 2 VIEW

[w chest pa]
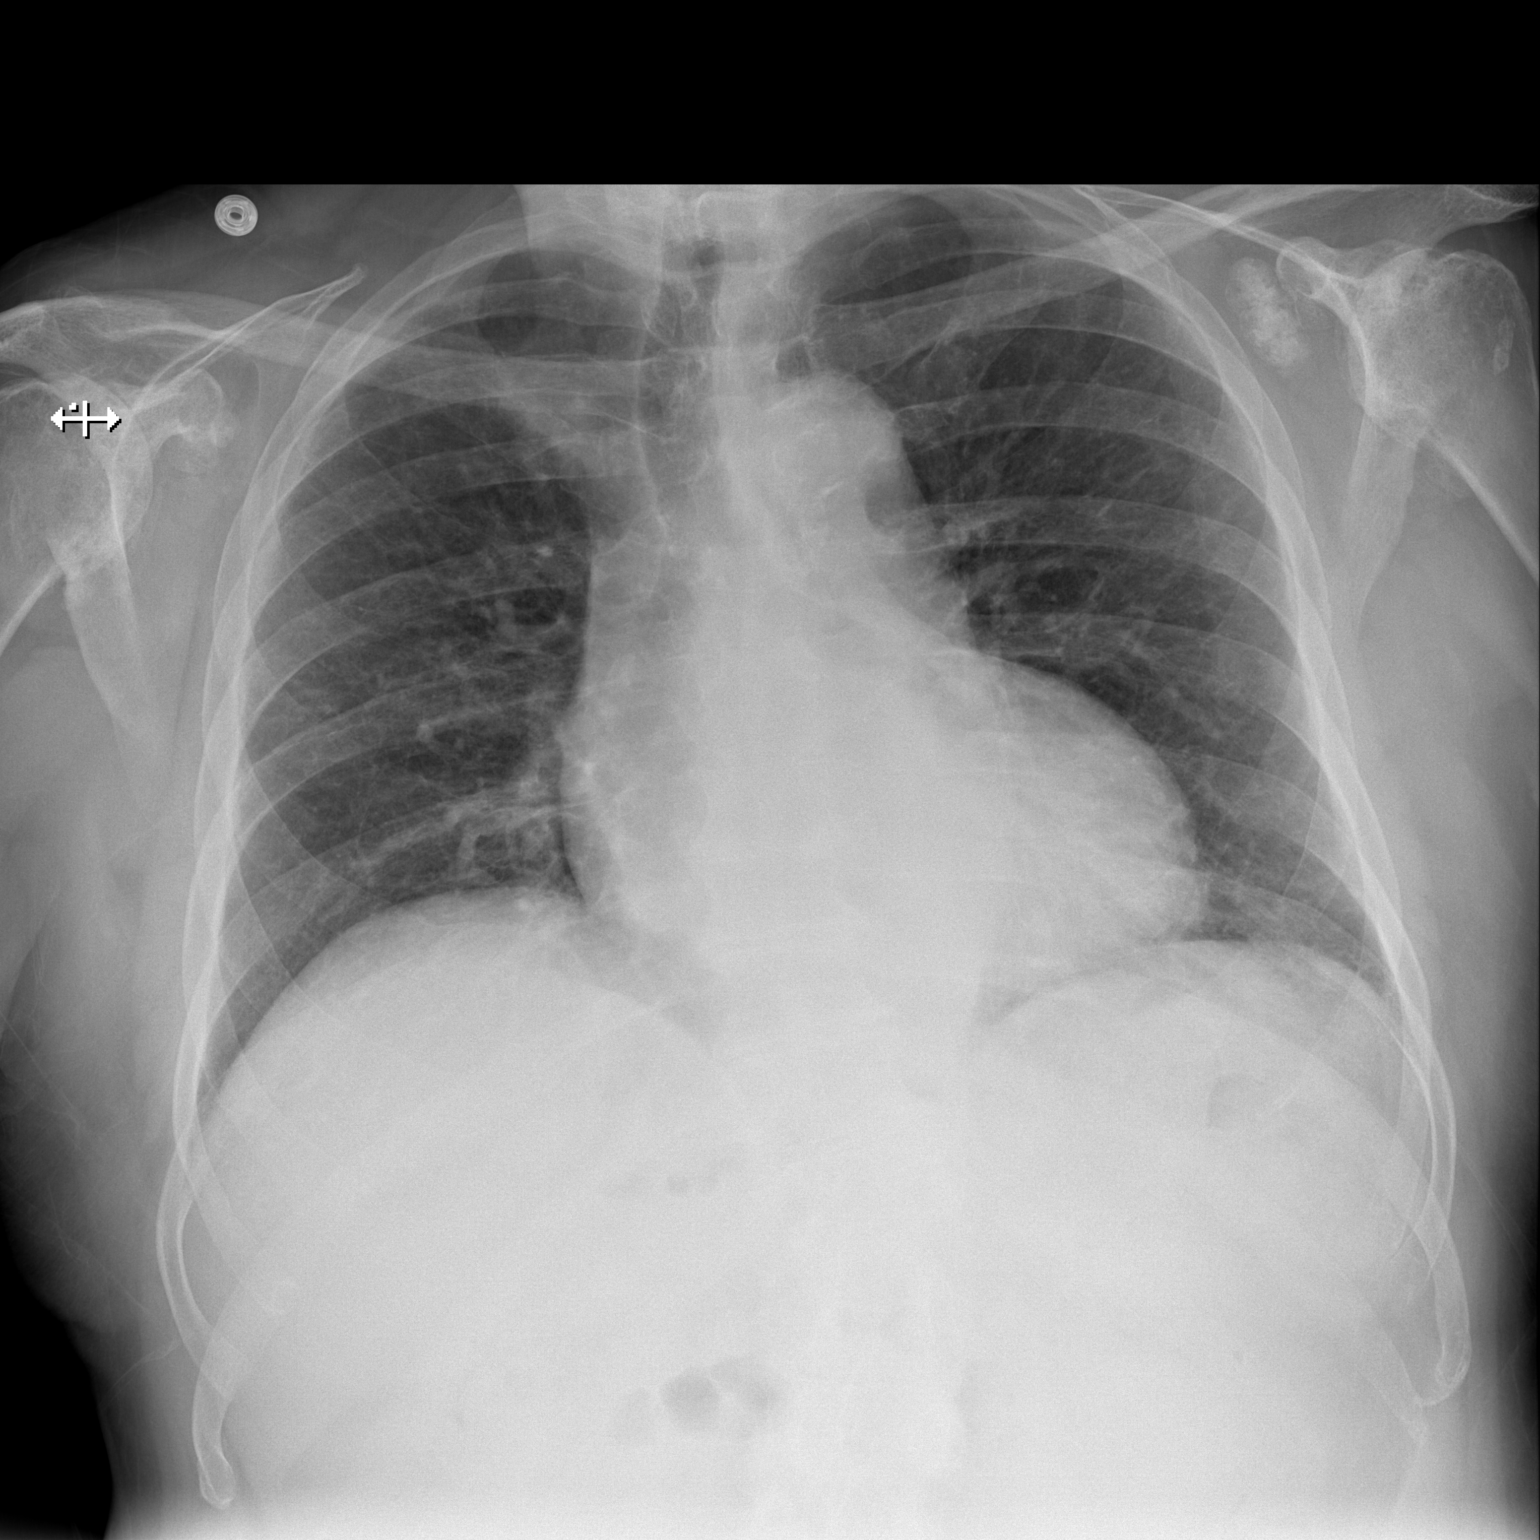

[w chest lat]
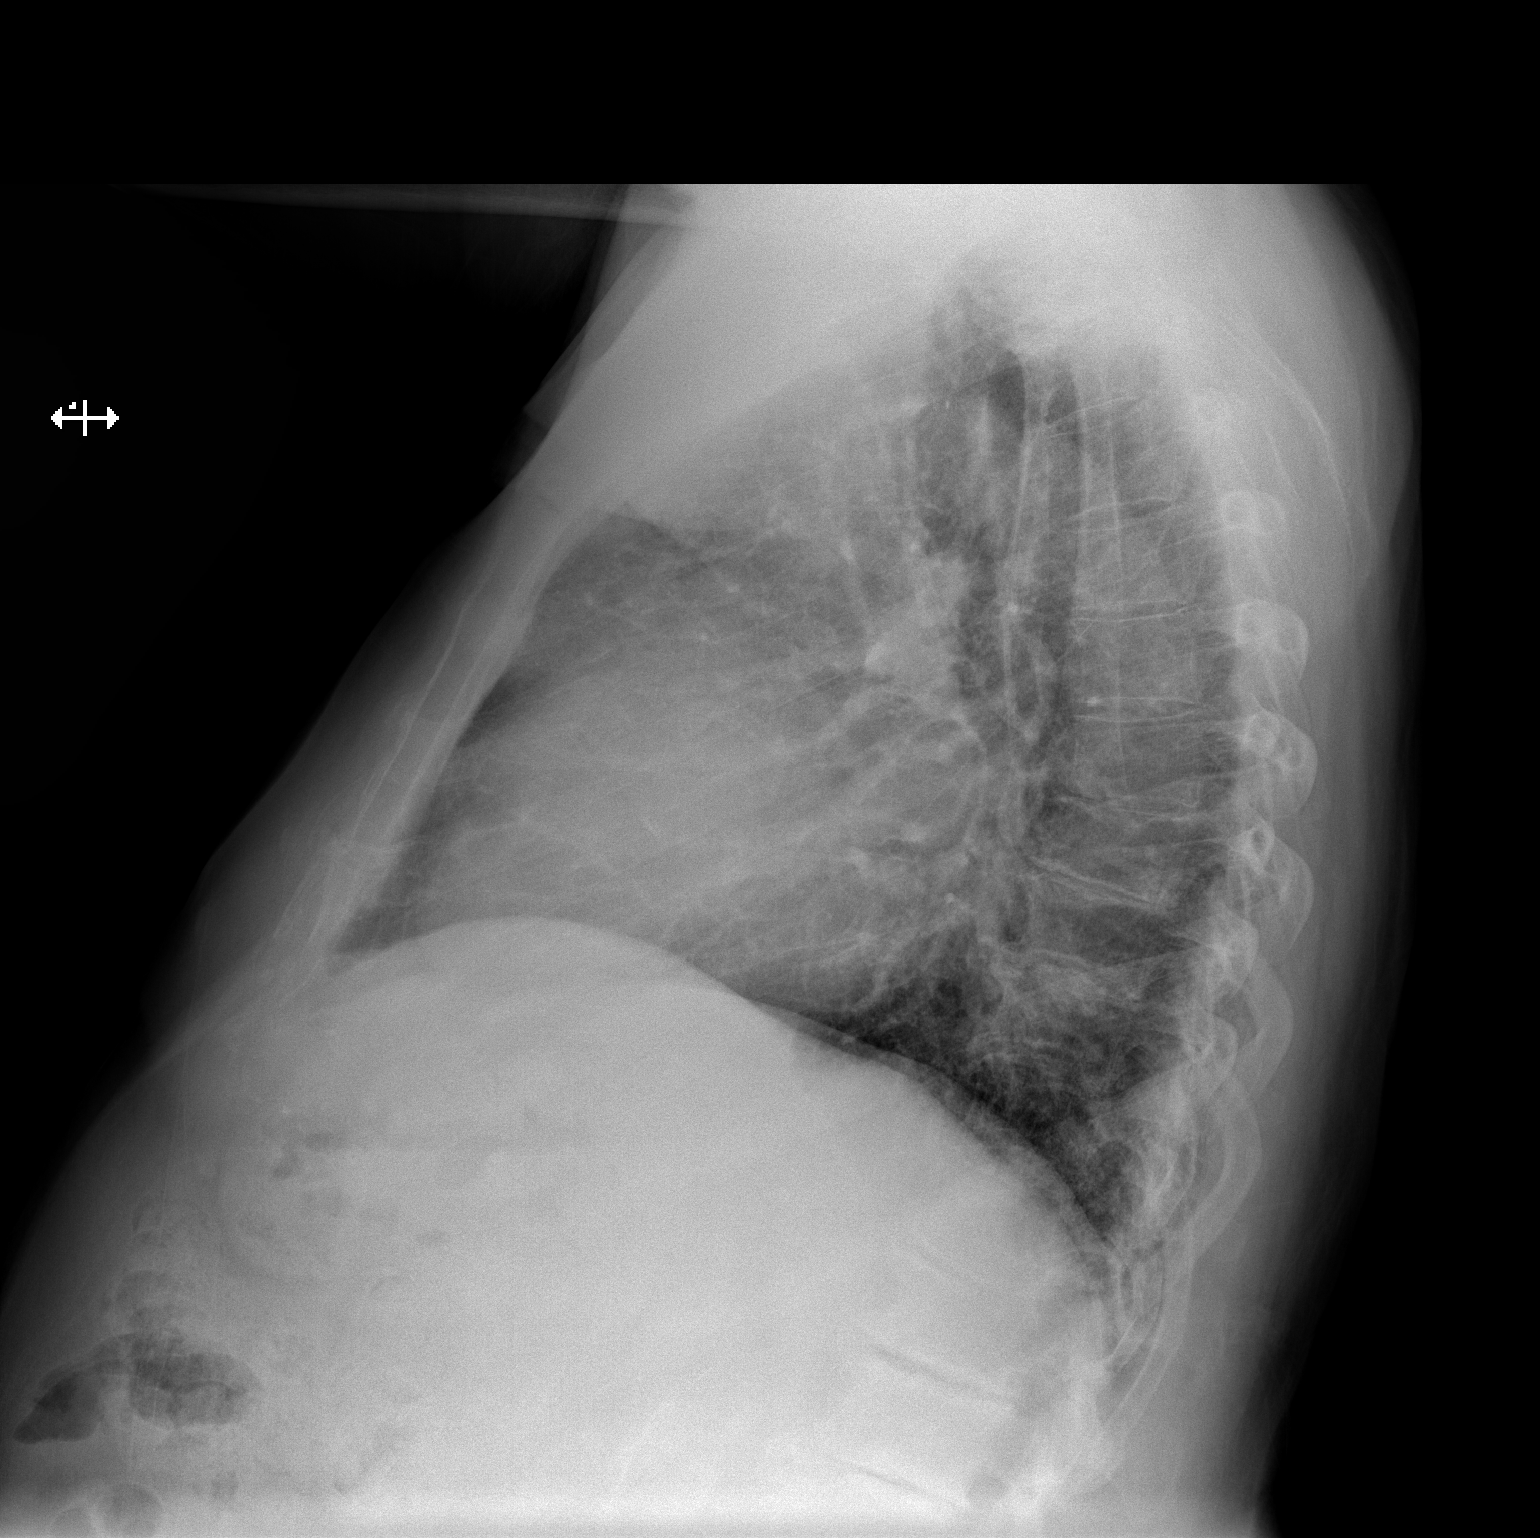

[2 of 2 positions shown; findings below may reference images not displayed]

FINDINGS: Patient rotated to the right. Stable mild mediastinal prominence,
most likely prominent great vessels. Stable cardiomegaly. Low lung
volumes with mild bibasilar atelectasis. No pleural effusion or
pneumothorax. Severe degenerative changes both shoulders. Loose
bodies noted about the left shoulder. Thoracolumbar spine scoliosis
and degenerative change.
IMPRESSION: 1.  Stable cardiomegaly.

2.  Low lung volumes with mild bibasilar atelectasis.

## 2022-05-15 NOTE — Therapy (Signed)
OUTPATIENT PHYSICAL THERAPY VESTIBULAR TREATMENT NOTE/RE-CERT/DISCHARGE SUMMARY     Patient Name: Ruben Barrera MRN: [id number] DOB:[birth year], 89 y.o., male Today's Date: 05/15/2022  END OF SESSION:  PT End of Session - 05/15/22 1548     Visit Number 6    Number of Visits 8    Date for PT Re-Evaluation 04/03/22    Authorization Type HTA    Authorization Time Period 03-10-22 - 04-22-22    PT Start Time 1445    PT Stop Time 1517    PT Time Calculation (min) 32 min    Activity Tolerance Patient tolerated treatment well    Behavior During Therapy [facility] for tasks assessed/performed                Past Medical History:  Diagnosis Date   Arthritis    Coronary artery disease    Depression    Dysrhythmia    "extra heart beat"   GERD (gastroesophageal reflux disease)    History of hiatal hernia    Hypercholesteremia    Hypertension    Lumbar spinal stenosis    RLS (restless legs syndrome)    takes ativan as needed   S/P TAVR (transcatheter aortic valve replacement) 02/10/2022   64m S3UR via TF approach with Dr. MAngelena Formand Dr. ETenny Craw  Stroke ([facility] 01/2020   minor - no deficits   Past Surgical History:  Procedure Laterality Date   BACK SURGERY     COLONOSCOPY     EYE SURGERY Bilateral    cataracts removed    INTRAOPERATIVE TRANSTHORACIC ECHOCARDIOGRAM N/A 02/10/2022   Procedure: INTRAOPERATIVE TRANSTHORACIC ECHOCARDIOGRAM;  Surgeon: MBurnell Blanks MD;  [place];  Service: Open Heart Surgery;  Laterality: N/A;   LUMBAR LAMINECTOMY/DECOMPRESSION MICRODISCECTOMY N/A 08/28/2015   Procedure: Lumbar Four-Five decompressive lumbar laminectomy;  Surgeon: RJovita Gamma MD;  [place];  Service: Neurosurgery;  Laterality: N/A;   REPLACEMENT TOTAL KNEE BILATERAL Bilateral 11/05/2003   RIGHT/LEFT HEART CATH AND CORONARY ANGIOGRAPHY N/A 03/28/2020   Procedure: RIGHT/LEFT HEART CATH AND CORONARY ANGIOGRAPHY;  Surgeon: MBurnell Blanks MD;  [place];  Service: Cardiovascular;  Laterality: N/A;   TEE WITHOUT CARDIOVERSION N/A 02/28/2021   Procedure: TRANSESOPHAGEAL ECHOCARDIOGRAM (TEE);  Surgeon: PFreada Bergeron MD;  [facility];  Service: Cardiovascular;  Laterality: N/A;   TEE WITHOUT CARDIOVERSION N/A 05/28/2021   Procedure: TRANSESOPHAGEAL ECHOCARDIOGRAM (TEE);  Surgeon: CWerner Lean MD;  [facility];  Service: Cardiovascular;  Laterality: N/A;   THORACIC DISCECTOMY N/A 07/10/2020   Procedure: Spinal cord stimulator via  - Thoracic Eight-Thoracic Nine, Thoracic Nine-Thoracic Ten Laminectomy;  Surgeon: JEustace Moore MD;  [place]  Service: Neurosurgery;  Laterality: N/A;  3C   TOE AMPUTATION Right 2010   second toe   TONSILLECTOMY     TRANSCATHETER AORTIC VALVE REPLACEMENT, TRANSFEMORAL N/A 02/10/2022   Procedure: Transcatheter Aortic Valve Replacement, Transfemoral;  Surgeon: MBurnell Blanks MD;  [place] [facility];  Service: Open Heart Surgery;  Laterality: N/A;   Patient Active Problem List   Diagnosis Date Noted   PAF (paroxysmal atrial fibrillation) ([place] 05/05/2022   Severe aortic valve stenosis 02/10/2022   S/P TAVR (transcatheter aortic valve replacement) 02/10/2022   Blood clotting disorder ([place] 01/26/2022   Coronary artery disease    Chronic kidney disease, stage 3a (HCC)    Bacterial endocarditis    Bacteremia due to Streptococcus 02/28/2021   RLS (restless legs syndrome)  02/26/2021   S/P insertion of spinal cord stimulator 07/10/2020   Frequent PVCs 04/15/2020   HFrEF (heart failure with reduced ejection fraction) ([place]) 04/15/2020   SVT (supraventricular tachycardia) 04/15/2020   Coronary artery disease involving native coronary artery of native heart without angina pectoris 0000000   Chronic systolic congestive heart failure ([place])    Mixed hyperlipidemia    Tobacco abuse    Depression     Erythrocytosis 02/20/2020   BPH (benign prostatic hyperplasia) 02/20/2020   HNP (herniated nucleus pulposus), lumbar 01/13/2016   Lumbar stenosis with neurogenic claudication 08/28/2015   Left shoulder pain 05/05/2011    PCP: Kathalene Frames, MD  REFERRING PROVIDER: Kathalene Frames, MD   REFERRING DIAG: R42 (ICD-10-CM) - Vertigo  THERAPY DIAG:  Unsteadiness on feet - Plan: PT plan of care cert/re-cert  Other abnormalities of gait and mobility - Plan: PT plan of care cert/re-cert  ONSET DATE: Referral date 02-25-22  Rationale for Evaluation and Treatment: Rehabilitation  SUBJECTIVE:   SUBJECTIVE STATEMENT: Pt reports he and wife are moving to [place] in 10 days to be closer to family; pt reports he continues to have transient episodes of visual disturbance (blurred vision/double vision) - states it lasts for a minute or 2 and then it resolves; says MD said it could be due to polypharmacy but would order a scan if it continued;  pt was recommended to follow up with a MD in [place] for further testing - informed pt that visual disturbance as he describes is not characteristic of BPPV episodes; pt reports no dizziness at this time Pt accompanied by: self  PERTINENT HISTORY:   NOMAR DEVENNY is a 89 y.o. male with a hx of HTN, HLD, Non-obstructive CAD without intervention, HFrEF,  LFLG-AS, right CVA stroke, frequent PVCs and SVT; s/p TAVR 02-10-22  PAIN:  Are you having pain? No  PRECAUTIONS: None  WEIGHT BEARING RESTRICTIONS: No  FALLS: Has patient fallen in last 6 months? No  LIVING ENVIRONMENT: Lives with: lives with their spouse Lives in: House/apartment  PLOF: Independent  PATIENT GOALS: resolve the vertigo  OBJECTIVE:   GAIT: Gait pattern: step through pattern Distance walked: 100' Assistive device utilized: None Level of assistance: Modified independence Comments: pt had Trendelenburg gait pattern due to LLD with RLE shorter than LLE;  1/4" heel lift  placed in Rt shoe and gait significantly improved; pt stated he felt much more stable and balanced, with less lateral lean to Rt side  VESTIBULAR TREATMENT:  TherEx:  bil. Heel raises 10 reps at counter with bil. UE support  Heel cord stretch - used bottom shelf of cabinet - pt performed stretch 20 secs each foot x 1 rep  NeuroRe-ed:   Standing Balance: Surface: Airex Position: Feet Hip Width Apart Completed with: Eyes Open and Eyes Closed; Head Turns x 5 Reps and Head Nods x 5 Reps   These exercises were issued again to pt - Medbridge Access Code: [email or web address] URL: [email or web address] Date: 05/15/2022 Prepared by: Ethelene Browns  Exercises - Standing Balance with Eyes Closed on Foam  - 1 x daily - 7 x weekly - 1 sets - 10 reps - Heel Raise  - 1 x daily - 7 x weekly - 1 sets - 10 reps - 3 sec hold - Slant Board Gastrocnemius Stretch  - 1 x daily - 7 x weekly - 1 sets - 2-3 reps - 30 sec hold  Self care; discussed appropriate shoe wear - pt currently sneakers with memory  foam which impacts balance as pt has neuropathy   PATIENT EDUCATION: Education details:  balance HEP - kicks 3 directions, marching and SLS - handout provided; added standing on compliant surface in corner to HEP - with EO and EC - with head turns (pt declined picture of this exercise) Person educated: Patient Education method: Explanation, Demonstration, and Handouts Education comprehension: verbalized understanding and returned demonstration  HOME EXERCISE PROGRAM:  GOALS: Goals reviewed with patient? Yes  LONG TERM GOALS: Target date: 04-03-22  LONG TERM GOALS: Target date:   Pt will have (-) Rt horizontal roll test to indicate resolution of Rt BPPV horizontal cupulolithiasis.  Baseline:  Goal status: Goal met 03-19-22  2.  Pt will perform bed mobility with no c/o dizziness. Baseline:  Goal status: Goal met 04-09-22  3.  Pt will report no vertigo with any mobility.  Baseline:  Goal  status: Goal met 04-09-22  4.  Independent in HEP for habituation and self treatment of BPPV. Baseline:  Goal status: Goal met 04-09-22   ASSESSMENT:  CLINICAL IMPRESSION: Pt returns to PT after being on hold since 04-09-22; pt reports no dizziness at this time but does continue to c/o visual disturbance described as blurred vision or double vision which randomly occurs and lasts "a minute or two".  Pt is moving to [place] in 10 days - was recommended to follow up with MD in [place] for further testing to determine etiology of visual disturbance episodes.  Pt continues to report imbalance; HEP of standing on foam was reviewed and exercises re-issued.  D/C due to pt moving out of state.   OBJECTIVE IMPAIRMENTS: decreased balance, difficulty walking, and dizziness.   ACTIVITY LIMITATIONS: bending, squatting, transfers, bed mobility, and locomotion level  PARTICIPATION LIMITATIONS: meal prep, laundry, shopping, and community activity  PERSONAL FACTORS: Age and Past/current experiences are also affecting patient's functional outcome.   REHAB POTENTIAL: Good  CLINICAL DECISION MAKING: Evolving/moderate complexity  EVALUATION COMPLEXITY: Moderate   PLAN:  PT FREQUENCY: 2x/week  PT DURATION: 4 weeks  PLANNED INTERVENTIONS: Therapeutic exercises, Therapeutic activity, Neuromuscular re-education, Balance training, Gait training, Patient/Family education, Self Care, Vestibular training, and Canalith repositioning  PLAN FOR NEXT SESSION:    D/C 05-14-22   PHYSICAL THERAPY DISCHARGE SUMMARY  Visits from Start of Care: 6  Current functional level related to goals / functional outcomes: See above for progress towards goals; vertigo remains resolved   Remaining deficits: Pt continues to have imbalance and unsteady gait secondary to neuropathy (?) but no assistive device used Cont. Report of transient episodes of visual disturbance - unknown etiology   Education / Equipment: Pt has been  instructed in HEP for balance/vestibular exercises and also for gastroc strengthening & stretching.    Patient agrees to discharge. Patient goals were met. Patient is being discharged due to meeting the stated rehab goals.    Alda Lea, PT 05/15/2022, 4:15 PM

## 2022-05-26 ENCOUNTER — Encounter: Payer: Self-pay | Admitting: Physical Therapy

## 2022-06-26 ENCOUNTER — Ambulatory Visit: Payer: PPO | Admitting: Internal Medicine

## 2022-12-22 NOTE — H&P (Signed)
 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  Moved here from [place]  earlier in 2024 to be closer to family.   At his initial visit 8Apr2024, he has very complex cardiac history including atrial fibrillation diagnosed on [facility] (records scanned) on Eliquis  with CHADS2VASC 6 (if no diabetes).  He has a diagnosis of partial focal seizures and also history of recurrent falls, although none since early 2024.  Chronic systolic CHF with LVEF 20%, no defibrillator, and on some GDMT (losartan  25, Jardiance ) but no BB due to bradycardia HR 51bpm, no spironolactone .    As of 7Jun2024, blood pressure at home is 120 systolic 70s diastolic.  He has fatigue frequently and shortness of breath with moderate levels of activity.  Uses a cane and a walker, mainly a cane.  No PND, orthopnea, or lower extremity edema.  No syncope or recent falls.  No palpitations.   As of 21Aug2024, he feels better following his defibrillator implantation in mid 2024 and overall his energy level is improved.  He walks 30-40 minutes 4 times per week and gets fatigued.  He has never had abdominal distention, lower extremity edema, or weight loss.  Blood pressure at home has occasionally been low with measurements less than 90 mmHg systolic.  Some near-syncope.  No PND, orthopnea, lower extremity edema, or chest pain.  Three falls over the summer, without syncope.   St Jude ICD 3Jul2024: flat histogram, Ap 70%, Vp 1%, PVC burde 4.9%, no AF  Past Medical History: Past Medical History:  Diagnosis Date  . Anxiety   . Aortic stenosis    Transfemoral TAVR 26mm Sapien3 [id number]  . Aortic stenosis    Echo 29Dec2023: LVEF 20-25% global hypokinesis, mod LVH, mild MR, no aortic  regugitation, mild TR,  . Automatic implantable cardioverter-defibrillator in situ   . BPH (benign prostatic hyperplasia)   . CAD (coronary artery disease)    Cath 6Jan2022: LAD- prox to mid LAD 60% stenosed, dist LAD 60% stenosed, LCx-prox Cx 30% stenosed, RCA-no dz  . Carotid stenosis, bilateral    CTA neck done for stroke workup with 50-60% stenosis bilateral ICA, moderate  . Cerebrovascular disease   . Chronic kidney disease   . Chronic pain    Back pain  . Chronic systolic CHF (congestive heart failure) (MULTI-HCC)    Baseline LVEF 20%  . Chronic systolic CHF (congestive heart failure) (MULTI-HCC)    Echo 14May2024: LVEF 30-35%, sinus rhythm with ventricular bigeminy throughout the examination, mod LV systolic dysfxn, indeterminate diastolic fxn, TAVR valve w/o stenosis and mild regurgitation, mild MR/TR, mod LVH 1.5cm, PA 35, IVC 5  . CKD (chronic kidney disease), stage III (HCC)    Cr 1.3-1.6 mg/dL in 7975  . Claustrophobia   . Coagulopathy (MULTI-HCC)   . CVA (cerebral vascular accident) (HCC)   . Dementia (HCC)   . Dysrhythmia   . Endocarditis of mitral valve 03/2021   healed MV endocarditis  . Frequent falls   . GERD (gastroesophageal reflux disease)   . Heart valve disease   . Hyperlipidemia   . Hypertension   . Insomnia   . Nonischemic cardiomyopathy (MULTI-HCC)    St Jude dual chamber ICD implant 19Jun2024  . Osteoarthritis   . Pacemaker   . PAF (paroxysmal  atrial fibrillation) (MULTI-HCC)    [facility]OT 27Dec2023: Tachycardia suspect SVT w/aberrancy & afib,  . PAF (paroxysmal atrial fibrillation) (MULTI-HCC)    4D CTA heart 14May2024: LAA ostium is oval in shape measuring 34.9 mm x  26.2 mm (Avg diameter of ~31 mm). Useable LAA depth is ~31 mm Maximal LAA depth is ~24 mm; no thrombus  . Seizure (MULTI-HCC)    Seen and followed by neurology, diagnosed as part of stroke workup in early 2024  . TIA (transient ischemic attack)   . Tobacco abuse    H/O     Past Surgical  History: Past Surgical History:  Procedure Laterality Date  . BACK SURGERY    . CARDIAC DEFIBRILLATOR PLACEMENT    . CARDIAC PACEMAKER PLACEMENT    . CARDIAC SURGERY    . EYE SURGERY     Cataract  . JOINT REPLACEMENT Bilateral    knee  . SPINAL CORD STIMULATOR IMPLANT    . TONSILLECTOMY    . VALVE REPLACEMENT       Home Medications: Medications Prior to Admission  Medication Sig Dispense Refill Last Dose  . aspirin  81 MG EC tablet Take 1 tablet (81 mg total) by mouth daily.   12/22/2022 at 0700  . atorvastatin (LIPITOR) 80 MG tablet Take 1 tablet (80 mg total) by mouth nightly.   12/21/2022 at 2000  . cholecalciferol, vitamin D3, (VITAMIN D3) 25 mcg (1,000 unit) capsule Take 1 capsule (1,000 Units total) by mouth daily.   12/22/2022 at 0700  . ELIQUIS  5 mg tablet Take 1 tablet (5 mg total) by mouth 2 (two) times daily.   12/22/2022 at 0700  . gabapentin  (NEURONTIN ) 600 MG tablet Take 1 tablet (600 mg total) by mouth 3 (three) times daily. 270 tablet 2 12/22/2022 at 1230  . JARDIANCE  10 mg Tab tablet Take 1 tablet (10 mg total) by mouth daily.   12/22/2022 at 0700  . metoprolol  (TOPROL -XL) 100 MG 24 hr tablet Take 1 tablet (100 mg total) by mouth daily. 90 tablet 3 12/22/2022 at 0700  . multivitamin capsule Take 1 capsule by mouth daily.   12/22/2022 at 0700  . omeprazole (PRILOSEC) 20 MG capsule Take 1 capsule (20 mg total) by mouth daily PRN.   12/22/2022 at 0700  . sacubitriL-valsartan (ENTRESTO) 24-26 mg tablet Take 1 tablet by mouth 2 (two) times daily. 180 tablet 3 12/22/2022 at 0700  . sertraline (ZOLOFT) 50 MG tablet Take 1 tablet (50 mg total) by mouth daily.   12/22/2022 at 0700  . tamsulosin  (FLOMAX ) 0.4 mg Cap Take 1 capsule (0.4 mg total) by mouth nightly.   12/21/2022 at 2000  . traZODone  (DESYREL ) 50 MG tablet Take 1 tablet (50 mg total) by mouth nightly as needed.   12/21/2022 at 2000  . hydrOXYzine  (ATARAX ) 10 MG tablet Take 1 tablet (10 mg total) by mouth 3 (three) times daily as  needed for Itching.   Unknown  . meclizine (ANTIVERT) 25 MG tablet Chew 1 tablet (25 mg total) by mouth 3 (three) times daily as needed.   Unknown    Allergies: No Known Allergies  Social History:  Social History   Tobacco Use  . Smoking status: Some Days    Types: Cigars  . Smokeless tobacco: Never  Substance Use Topics  . Alcohol use: Not Currently     Family History:  Family History  Problem Relation Age of Onset  . Stroke Mother   . Stroke Father      Review  of Systems CONSTITUTIONAL: No night sweats. No malaise, lethargy. No fever or chills.  HEENT: Eyes: No visual changes. No eye pain or discharge. No epistaxis. No sinus pain. No sore throat. No odynophagia.  BREASTS: No soreness, lumps, or discharge.  RESPIRATORY: No coughing with purulent sputum. No wheeze. No hemoptysis.   GASTROINTESTINAL: No abdominal pain. No nausea or vomiting. No diarrhea or constipation. No hematemesis. No hematochezia. No melena.  GENITOURINARY: No urgency. No frequency. No dysuria. No hematuria.  MUSCULOSKELETAL: No musculoskeletal pain. No joint swelling.  NEUROLOGICAL: No confusion.  No headache or neck pain. No seizure. No focal weakness or sensory deficits. PSYCHIATRIC:  No hallucinations.  SKIN: No wounds.  ENDOCRINE: No unexplained weight loss. No polydipsia. No polyuria. No polyphagia.  HEMATOLOGIC: No anemia. No purpura. No petechiae. No prolonged or excessive bleeding.  ALLERGIC AND IMMUNOLOGIC: No pruritus. No swelling.    OBJECTIVE:  24 Hour Vital Sign Ranges: Temperature:  [97 F (36.1 C)] 97 F (36.1 C) Pulse:  [73] 73 Resp:  [18] 18 BP: (129)/(79) 129/79 SpO2:  [98 %] 98 % Most recent vitals: BP 129/79 (BP Location: Right Arm, Patient Position: Lying)   Pulse 73   Temp 97 F (36.1 C)   Resp 18   Ht 172.7 cm (68)   Wt 83.5 kg (184 lb)   SpO2 98%   BMI 27.98 kg/m   Intake/Output last 3 shifts:  No intake/output data recorded. Intake/Output current shift: No  intake/output data recorded.   Physical Exam: VITAL SIGNS: Reviewed. GENERAL: The patient appeared to be in no distress. Alert and oriented.  HEENT: Atraumatic and normocephalic. Eyes: Extraocular muscles were intact. Anicteric. Pupils were equally reactive to light. Throat: There was no thrush, no exudate, no erythema. There was no evidence of gum bleeding.  NECK: No thyromegaly. There was no JVD.  Carotid auscultation:  Right no bruit. Left no bruit. CHEST: Normal chest without pectus excavatum. BREASTS: There was no gynecomastia.  HEART: PMI nondisplaced.  Regular rate, regular rhythm.  Normal S1.  Normal S2.  No S3.  No S4.  No rubs.  No murmur.  LUNGS: Air entry was good. There were no crackles.  No wheezes. ABDOMEN: Soft and nontender.  No hepatosplenomegaly.  Bowel sounds were normal. RECTAL: Not performed MUSCULOSKELETAL: No peripheral edema. PERIPHERAL PULSES:             Upper--> Right radial 2+, Left radial 2+             Lower--> Right PT 2+ and DP 2+, Left PT 2+ and DP 2+ SKIN: No bruising.  No petechiae.  No ulceration. NEUROLOGICAL: Grossly normal without focal deficits.  Full range of motion in all the extremities.   Recent Results (from the past 48 hour(s))  ECG 12-Lead   Collection Time: 12/22/22 12:17 PM  Result Value Ref Range   VENTRICULAR RATE 86 BPM   ATRIAL RATE 75 BPM   PRINT 182 ms   QRS DURATION 158 ms   Q-T INTERVAL 458 ms   QTCINT 548 ms   P AXIS -20 degrees   R AXIS -92 degrees   T AXIS 43 degrees   DIAGNOSIS      Test Reason : preop - Vent. Rate : 086 BPM     Atrial Rate : 075 BPM    P-R Int : 182 ms          QRS Dur : 158 ms     QT Int : 458 ms  P-R-T Axes : -20 268 043 degrees    QTc Int : 548 ms  Atrial-paced rhythm with frequent ventricular-paced complexes and with occasional Premature ventricular complexes Right bundle branch block Abnormal ECG When compared with ECG of 09-Sep-2022 12:48, Electronic ventricular pacemaker has  replaced Sinus rhythm  Referred By:             Confirmed By:    Prothrombin Time with INR   Collection Time: 12/22/22 12:45 PM  Result Value Ref Range   Prothrombin Time (PT) 20.5 (H) 9.4 - 12.5 s   International Normalized Ratio (INR) 1.8 (H) <=1.1  Hemoglobin A1c   Collection Time: 12/22/22 12:45 PM  Result Value Ref Range   Hemoglobin A1c 5.7 (H) 3.8 - 5.6 %   Estimated Average Glucose 117 mg/dL  (P) CBC WITH DIFFERENTIAL   Collection Time: 12/22/22 12:45 PM  Result Value Ref Range   White Blood Cell Count 7.1 4.5 - 11.0 10*3/uL   Red Blood Cell Count 5.07 4.50 - 6.00 10*6/uL   Hemoglobin 16.2 14.0 - 18.0 g/dL   Hematocrit 50.5 57.9 - 52.0 %   MCV 97.4 80.0 - 99.0 fL   MCH 32.0 27.0 - 34.5 pg   MCHC 32.8 32.0 - 36.5 g/dL   RDW 86.5 88.9 - 84.9 %   Platelet Count 186 150 - 450 10*3/uL   MPV 9.6 7.4 - 12.0 fL   Segmented Neutrophils 64.0 %   Lymphocytes 22.6 %   Monocytes 9.6 %   Eosinophils 2.5 %   Basophils 1.0 %   Immature Granulocytes 0.3 %   Segmented Neutrophils, Absolute 4.54 1.92 - 8.64 10*3/uL   Lymphocytes, Absolute 1.60 0.72 - 4.32 10*3/uL   Monocytes, Absolute 0.68 0.09 - 0.99 10*3/uL   Eosinophils, Absolute 0.18 0.00 - 0.76 10*3/uL   Basophils, Absolute 0.07 0.00 - 0.22 10*3/uL  Basic metabolic panel   Collection Time: 12/22/22  1:34 PM  Result Value Ref Range   Glucose 106 (H) 70 - 99 mg/dL   Sodium 860 863 - 854 meq/L   Potassium 4.3 3.5 - 5.1 meq/L   Chloride 110 (H) 98 - 107 meq/L   Anion Gap 6 6 - 16 meq/L   Carbon Dioxide 23 21 - 32 meq/L   BUN 20 (H) 7 - 18 mg/dL   Creatinine 8.69 9.29 - 1.30 mg/dL   BUN/Creatinine Ratio 15 7 - 25   Calcium 8.8 8.5 - 10.1 mg/dL   Estimated Glomerular Filtration Rate (eGFR) 53 (L) >=60 mL/min/1.73m2  Lipid Panel   Collection Time: 12/22/22  1:34 PM  Result Value Ref Range   Cholesterol, Total 144 0 - 200 mg/dL   Triglycerides 870 0 - 150 mg/dL   HDL Cholesterol 50 40 - 60 mg/dL   Cholesterol/HDL Ratio 2.9     LDL Cholesterol 85 0 - 100 mg/dL   Non-HDL Cholesterol 94 mg/dL     Data Review:  BMP Lab Results  Component Value Date   GLU 106 (H) 12/22/2022   BUN 20 (H) 12/22/2022   CREA 1.30 12/22/2022   NA 137 09/09/2022   K 4.3 12/22/2022   CL 110 (H) 12/22/2022   CO2 23 12/22/2022   CA 8.8 12/22/2022   AGAP 6 12/22/2022    CBC Lab Results  Component Value Date   WBC 7.1 12/22/2022   RBC 5.07 12/22/2022   HGBAU 16.2 12/22/2022   HCT 49.4 12/22/2022   MCV 97.4 12/22/2022   MCH 32.0 12/22/2022  MCHC 32.8 12/22/2022   RDW 13.4 12/22/2022   PLT 186 12/22/2022   MPV 9.6 12/22/2022     Lipid Profile Lab Results  Component Value Date   CHOL 144 12/22/2022   TRIG 129 12/22/2022   HDL 50 12/22/2022   LDL 85 12/22/2022     Additional Labs No results found for: HGA1C, BNP, TNI   Troponin & BNP:  Lab Results  Name Value Date/Time   TROPONIN T, HS 35 07/06/2022 1700    Diagnostics:     EKG: Apr2024 NSR RBBB with bigeminy. Normal axis and intervals. No ST or Twave abnormalities.   Radiology Review:    No results found.   Hospital Current Medications: Scheduled: , Continuous Infusions:  . 0.9% sodium chloride  (NaCl)    , and PRN:    ASSESSMENT & PLAN:  1. CVA (cerebral vascular accident) (HCC) Management per neurology.  On aspirin  81mg  daily.   2. HTN (hypertension): uncontrolled Management based on nonischemic cardiomyopathy and heart failure.   3. Paroxysmal atrial fibrillation (HCC) CHADS2VASC is 6, corresponding with >9% risk of stroke  Continue on Eliquis , but if additional falls will need to stop.  Seizures are partial focal but there is a high fall risk Underwent defibrillator placement summer 2024 then 3 month recovery.  Due to fall risk, he is here for Amulet LAA closure.  Consent obtained.  Anatomy suitable for Amulet closure. Following implant, will do DAPT for 3 months, seal confirmation with TEE, then single antiplatelet therapy  thereafter   4. CAD (coronary artery disease) Mild-to-moderate diffuse CAD by heart catheterization in 2022.   5. Carotid stenosis, bilateral CTA head and neck with moderate stenosis.  A carotid ultrasound will be due in 2025.   6. Chronic kidney disease Per primary care/nephrology.  Continue Jardiance  (DAPA-CKD indication).   7. Endocarditis of mitral valve Reportedly healed endocarditis noted in office visit Jan2023 Will need prophylaxis for dental work   8. Chronic systolic CHF Euvolemic.  Stop lasix  Aug2024 as he may be overmedicated and periodically with low BP.  Counseled patient.   9. Nonischemic dilated cardiomyopathy Borderline LV dilation.  Persistent LVEF <35% despite appropriate GDMT.  St Jude ICD functioning normally.  Change losartan  to [facility] as of 7Jun2024.   10. Aortic stenosis The TAVR valve is functioning normally.  A follow-up echocardiogram will be due in mid 2025   11. Bigeminy Previous overall PVC burden was not known.  Following high-dose beta-blocker and ICD implantation, PVC burden initially around 5% as of Aug2024.  This is acceptable and will be monitored closely.   12. Sick sinus syndrome The St Jude ICD is functioning normally, but due to exertional fatigue, changed her rate response today.   13. Hyperlipidemia Stop zetia  - no mortality benefit.    Marcey Morris, MD 12/22/2022 2:00 PM

## 2022-12-23 NOTE — Discharge Summary (Signed)
 HOSPITAL DISCHARGE SUMMARY Discharge Summary Patient: Ruben Barrera Patient MRN: [id number] Patient DOB: Mar 31, 1932  Admission Date: 12/22/2022 Discharge Date: 12/23/2022   See full procedure report for details.  Being discharged after successful procedure.  Marcey Morris, MD 12/23/2022 8:13 AM

## 2022-12-23 NOTE — Progress Notes (Addendum)
 0730 patient is agitated and upset. He wants to be discharge early as Dr. Josie told him yesterday. Explained to the patient the process of discharge and orders was not placed. Called Dr. Lovella and left a message.  0740 refused Telemetry and chair alarm. Education about fall precautions discussed with patient but still refused  63 Dr. Lovella called back.

## 2022-12-30 NOTE — Discharge Summary (Signed)
 HOSPITAL MEDICINE DISCHARGE SUMMARY Discharge Summary Patient: Ruben Barrera Patient MRN: [id number] Patient DOB: 26-Sep-1932  Admission Date: 12/28/2022 Discharge Date: 12/30/2022    Discharge Diagnoses:  Principal Problem:   Acute dyspnea Active Problems:   Partial seizures (MULTI-HCC)   History of stroke   CAD (coronary artery disease)   Carotid stenosis, bilateral   CKD (chronic kidney disease), stage III (HCC)   CVA (cerebral vascular accident) (HCC)   Endocarditis of mitral valve   HFrEF (heart failure with reduced ejection fraction) (MULTI-HCC)   History of transcatheter aortic valve replacement (TAVR) 26mm Edward Sapien   Nonischemic dilated cardiomyopathy (MULTI-HCC)   Paroxysmal atrial fibrillation (MULTI-HCC)   HFrEF (heart failure with reduced ejection fraction) (MULTI-HCC)   CAD (coronary artery disease)   AKI (acute kidney injury) (CMS-HCC) Resolved Problems:   * No resolved hospital problems. *    Hospital Course/Summary:  89 yo M with PMH of HTN, HLP, CAD PCI in 2022, A fib on DAPT, s/p watchman's procedure, moderate B/l CAS, healed MV endocarditis, SSS & NICMP, with EF <35% s/p ICD, AS s/p TAVR, prior R cerebellar CVA, Partial seizures on gabapentin , anxiety & claustrophobia is admitted to the hospital with SOB. Pt underwent amulet closure on 12/22/2022 by Dr Lovella at [facility] and states has had symptoms since then. Lasix  was recently dc'ed in August. Pt is on entrestro and jardiance .  Patient was initiated on Lasix  40 IV b.i.d. and he diuresed well.  He remained on room air.  Patient did have episodes of tachyarrhythmia/RVR on activity.  His pacemaker/ICD also was warning  unable to capture.  Cardiology was consulted.  Dr Lovella saw the patient and felt that symptoms were possibly due to RVR.  Echo was done to ensure there was no pericardial effusion.  Pacemaker interrogation was also reviewed by Dr. Lovella.  Cardiology recommended discharging on Lasix   20 p.o. b.i.d. along with Entresto and Jardiance  at at discharge.  Patient will follow up with Cardiology in 3-4 days as outpatient for further adjustment in medications.  Patient was insistent upon going home and therefore prescriptions were provided.  Cardiology cleared the patient for discharge.  Patient was discharged in stable condition with outpatient follow-up   Physical exam on the day of discharge patient is alert awake orient x3 chest clear to auscultation irregular rhythm    12/30/2022:  Patient refusing his Lasix  this morning.  He states he wants to go home.  Patient raising his voice and yelling at his wife over the phone as well as on MD in states he wants to go home as he can not do well in the hospital.   Patient has been struggling with rate control overnight and heart rate goes into the 130s on ambulation and movement.   Patient's pacemaker/ICD morning that devices unable to capture Cardiology Dr. Lovella made aware.  Will follow up with him.   Patient counseled that he would need adjustment in his diuretics as well as his guideline mediated therapy prior to discharge.   Pacemaker interrogation ordered.   Patient unwilling to stay.  Counseled him that he would need to leave against medical advice and that I would recommend against that.  He voiced understanding. Didactic rounding conducted with patient's RN Slater   HFrEF acute on chronic exacerbation - Persistent LVEF <35% despite appropriate GDMT.  St Jude ICD functioning normally as of 12/22/22 -lasix  40mg  IVPB BID -I's and O's -telemetry -Discussed with Dr. Lovella. He will not consult on this admission,  but recommends patient follow up in his office in 1-2 weeks    2.  HTN -hold entresto while inpatient due to increased creatinine   3. Paroxysymal afib wth defibrillator and s./p amulet LAA closure by DR. Kindsvater on 12/22/22 at [facility] -not to continue on eliquis  per Dr. Lovella  -DAPT x 3 mos   4.  CAD s.p heart cath in 2022   5. CKD stage 3 with acute kidney injury -careful diureiss -consult nephrology if creatinine worsens   6.  History of CVA -aspirin    7.  SSS: st jude ICD implantation     8. Hypermagnesmia: montior -consult nephrology if it persists     DVT Prophylaxis; aspirin  and plavix     []  Lovenox  subcutaneous  [] Heparin  subcutaneous  [] SCDs  [] Low Risk: Ambulate    Code status: DNR/COT   Significant Findings/Diagnostic Studies:  See Hospital Course/Summary.   Discharge Medications:    Medication List     START taking these medications    furosemide  20 MG tablet Commonly known as: Lasix  Take 1 tablet (20 mg total) by mouth 2 (two) times a day.       CONTINUE taking these medications    aspirin  81 MG EC tablet   atorvastatin 80 MG tablet Commonly known as: LIPITOR   clopidogreL  75 mg tablet Commonly known as: PLAVIX  Take 1 tablet (75 mg total) by mouth daily. Indications: prevention for a blood clot going to the brain   Entresto 24-26 mg tablet Generic drug: sacubitriL-valsartan Take 1 tablet by mouth 2 (two) times daily.   gabapentin  600 MG tablet Commonly known as: Neurontin  Take 1 tablet (600 mg total) by mouth 3 (three) times daily.   hydrOXYzine  10 MG tablet Commonly known as: ATARAX    Jardiance  10 mg Tab tablet Generic drug: empagliflozin    meclizine 25 MG tablet Commonly known as: ANTIVERT   metoprolol  100 MG 24 hr tablet Commonly known as: TOPROL -XL Take 1 tablet (100 mg total) by mouth daily.   multivitamin capsule   omeprazole 20 MG capsule Commonly known as: PriLOSEC   sertraline 50 MG tablet Commonly known as: ZOLOFT   tamsulosin  0.4 mg Cap Commonly known as: FLOMAX    traZODone  50 MG tablet Commonly known as: DESYREL    Vitamin D3 25 mcg (1,000 unit) capsule Generic drug: cholecalciferol (vitamin D3)         Where to Get Your Medications     These medications were sent to [facility]  [id number] [place], [place] - [address] AT [facility] [address]  [address], [place] [address]    Hours: 24-hours Phone: [phone]  furosemide  20 MG tablet    Signed & Held Discharge Readmit Med Orders (From admission, onward)    None         Discharge Recommendations:   Discharge disposition: Home or Self Care   Recommended diet:    Recommended activity:    Discharge condition: Good  Other Discharge Patient/Family Instructions: As above  Follow-Up:  Your appointments    Dec 31, 2022  2:30 PM (Arrive by 2:15 PM) Hospital Discharge/TCM/ED Follow Up with Marcey Lovella, MD [facility] & [facility] [place]) [id number] [place] [address] [phone]     Jan 22, 2023 11:00 AM (Arrive by 10:45 AM) Video Visit with Patrcia Jeryl Colon, MD [facility] [place] [facility]  [facility] [place]) [address] [place] [address] [phone]  Before your video visit begins, you must complete eCheck-In. Make sure you have a reliable internet connection and that your camera, microphone, and speaker are functioning properly.   Minors require parental or guardian presence for the visit.     Feb 10, 2023 10:15 AM (Arrive by 10:00 AM) [facility] Complete with HTPN [facility] [place] [facility] & [facility] [place]) [phone] [place] [address] [phone]     Feb 23, 2023 10:15 AM (Arrive by 10:00 AM) Follow Up Visit with Marcey Morris, MD [facility] [place] [facility] [place]) [phone] [place] [address] [phone]         Labs, Procedures and Other Follow-ups after Discharge     Ambulatory Referral to Cardiac Rehab     Treatment Plan:  Outpatient monitored cardiac rehab, duration based on patient  progress up to a total of 36 sessions to a maximum of 12-36 weeks:  1.  Progressive exercise training, 2-5 times per week, 30-90 minutes per session, utilizing treadmill, stationary cycling, resistance training and other conditioning activities.  2.  Exercise assessment to develop initial exercise prescription and measure progress at program completion.    3.  Education to promote an active healthy lifestyle and reduction of personal risk factors.  4.  Cardiac Rehab Phase III Maintenance Program after completion of Phase II Cardiac Rehab:  Supervised exercise instruction  Telemetry monitoring available if needed for cardiac symptoms  This patient is medically stable and cleared to begin cardiac rehabilitation.         Gunnar Cruel, MD 12/30/2022 4:13 PM    I have seen and examined the patient today. The above diagnoses were explained to the patient. Questions answered.  Total Time Spent for Discharge: greater than 35 minutes

## 2023-01-04 NOTE — Progress Notes (Signed)
 ------------------------------------------------------------------------------- Attestation signed by Lovella Kemps, MD at 01/04/2023  5:03 PM Patient seen and examined.  Agree with the plan as outlined by Dr. Chales.  CHF exacerbation early Oct2024 due to: Withdrawal from lasix  late Aug2024 Increased fluid intake starting late Sep2024 Development of persistent AF early Oct2024.  Plan: Diuresis re-started 8Oct2024 with lasix  20mg  bid --> feeling better. In AF by exam 14Oct2024.  If still in AF in mid Nov2024, then will consider possible cardioversion. Continue DAPT for now - doing well post Amulet LAA closure.   Complex patient for whom I have an ongoing / continuous relationship and manage many chronic cardiovascular conditions and related conditions.  Management will be as described above.  -------------------------------------------------------------------------------  [facility]                                         [address]                                             [address]                                                             [place], [place]  [address]                                             CLINIC PROGRESS NOTE  Subjective:    Ruben Barrera is a 89 y.o. male who is seen today for cardiology evaluation.  History of Present Illness Routine follow-up.  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  Moved here from [place]  earlier in 2024 to be closer to family.   Admitted 8Oct2024 with shortness of breath and weakness.  Following Amulet LAA closure, he developed increased AF burden then had edema develop, along with SOB class IV.  Diuresed and back to baseline.  He also has had chest discomfort radiating to the back.  He had been taking furosemide , but it was stopped [id number] by me in clinic.   At his initial visit 8Apr2024, he has very complex cardiac history including atrial fibrillation diagnosed on [facility] (records scanned) on  Eliquis  with CHADS2VASC 6 (if no diabetes).  He has a diagnosis of partial focal seizures and also history of recurrent falls, although none since early 2024.  Chronic systolic CHF with LVEF 20%, no defibrillator, and on some GDMT (losartan  25, Jardiance ) but no BB due to bradycardia HR 51bpm, no spironolactone .    As of 7Jun2024, blood pressure at home is 120 systolic 70s diastolic.  He has fatigue frequently and shortness of breath with moderate levels of activity.  Uses a cane and a walker, mainly a cane.  No PND, orthopnea, or lower extremity edema.  No syncope or recent falls.  No palpitations.   As of 21Aug2024, he feels better following his defibrillator implantation in mid 2024 and overall his energy level is improved.  He walks 30-40 minutes 4 times  per week and gets fatigued.  He has never had abdominal distention, lower extremity edema, or weight loss.  Blood pressure at home has occasionally been low with measurements less than 90 mmHg systolic.  Some near-syncope.  No PND, orthopnea, lower extremity edema, or chest pain.  Three falls over the summer, without syncope.   As of 14Oct2024: hospital admission for CHF exacerbation s/p amulet placement. Device interrogation revealed increased afib burden around this time, his diuretics were previously held as he had been euvolemic. Symptoms have improved with diuresis and he feels closer to baseline. Does still experience some mild orthopnea.   St Jude ICD 3Jul2024: flat histogram, Ap 70%, Vp 1%, PVC burde 4.9%, no AF St Jude ICD 9Oct2024: A:AF, 2.19mV, 44ohms; V:0.5V,>8mV,410/62ohms; Ap 56%, Vp 6.6%; 5.2% AF burden: AF onset 10/4. V rate during AF 70's to 170's(several strips with RVR; overall function normal    Medical History Past Medical History:  Diagnosis Date  . Acute dyspnea 12/28/2022  . Anxiety   . Aortic stenosis    Transfemoral TAVR 26mm Sapien3 [id number]  . Aortic stenosis    Echo 29Dec2023: LVEF 20-25% global hypokinesis, mod  LVH, mild MR, no aortic regugitation, mild TR,  . BPH (benign prostatic hyperplasia)   . CAD (coronary artery disease)    Cath 6Jan2022: LAD- prox to mid LAD 60% stenosed, dist LAD 60% stenosed, LCx-prox Cx 30% stenosed, RCA-no dz  . CAD (coronary artery disease) 12/28/2022  . Carotid stenosis, bilateral    CTA neck done for stroke workup with 50-60% stenosis bilateral ICA, moderate  . Chronic pain    Back pain  . Chronic systolic CHF (congestive heart failure) (MULTI-HCC)    Baseline LVEF 20%  . Chronic systolic CHF (congestive heart failure) (MULTI-HCC)    Echo 14May2024: LVEF 30-35%, sinus rhythm with ventricular bigeminy throughout the examination, mod LV systolic dysfxn, indeterminate diastolic fxn, TAVR valve w/o stenosis and mild regurgitation, mild MR/TR, mod LVH 1.5cm, PA 35, IVC 5  . Chronic systolic CHF (congestive heart failure) (MULTI-HCC)    Echo 9Oct2024: LVEF 20-25%, PA 35-40, ivc not visualized, trivial pericardial effusion, limited study - valves not assessed  . CKD (chronic kidney disease), stage III (HCC)    Cr 1.3-1.6 mg/dL in 7975  . Claustrophobia   . CVA (cerebral vascular accident) (HCC)   . Dementia (HCC)   . Endocarditis of mitral valve 03/2021   healed MV endocarditis  . Frequent falls   . GERD (gastroesophageal reflux disease)   . Hyperlipidemia   . Hypertension   . Insomnia   . Nonischemic cardiomyopathy (MULTI-HCC)    St Jude dual chamber ICD implant 19Jun2024  . Osteoarthritis   . PAF (paroxysmal atrial fibrillation) (MULTI-HCC)    [facility]OT 27Dec2023: Tachycardia suspect SVT w/aberrancy & afib,  . PAF (paroxysmal atrial fibrillation) (MULTI-HCC)    4D CTA heart 14May2024: LAA ostium is oval in shape measuring 34.9 mm x  26.2 mm (Avg diameter of ~31 mm). Useable LAA depth is ~31 mm Maximal LAA depth is ~24 mm; no thrombus  . PAF (paroxysmal atrial fibrillation) (MULTI-HCC)    Amulet LAA 22mm closure / implant on 1Oct2024  . Seizure (MULTI-HCC)    Seen  and followed by neurology, diagnosed as part of stroke workup in early 2024  . TIA (transient ischemic attack)   . Tobacco abuse    H/O    Surgical History Past Surgical History:  Procedure Laterality Date  . BACK SURGERY    . CARDIAC DEFIBRILLATOR  PLACEMENT    . CARDIAC PACEMAKER PLACEMENT    . CARDIAC SURGERY    . EYE SURGERY     Cataract  . JOINT REPLACEMENT Bilateral    knee  . SPINAL CORD STIMULATOR IMPLANT    . TONSILLECTOMY    . VALVE REPLACEMENT      Current Medications  Medication Sig  . aspirin  81 MG EC tablet Take 1 tablet (81 mg total) by mouth daily.  SABRA atorvastatin (LIPITOR) 80 MG tablet Take 1 tablet (80 mg total) by mouth nightly.  . cholecalciferol, vitamin D3, (VITAMIN D3) 25 mcg (1,000 unit) capsule Take 1 capsule (1,000 Units total) by mouth daily.  . clopidogreL  (PLAVIX ) 75 mg tablet Take 1 tablet (75 mg total) by mouth daily. Indications: prevention for a blood clot going to the brain  . furosemide  (LASIX ) 20 MG tablet Take 1 tablet (20 mg total) by mouth 2 (two) times a day.  . gabapentin  (NEURONTIN ) 600 MG tablet Take 1 tablet (600 mg total) by mouth 3 (three) times daily.  . hydrOXYzine  (ATARAX ) 10 MG tablet Take 1 tablet (10 mg total) by mouth 3 (three) times daily as needed for Itching.  . JARDIANCE  10 mg Tab tablet Take 1 tablet (10 mg total) by mouth daily.  . meclizine (ANTIVERT) 25 MG tablet Chew 1 tablet (25 mg total) by mouth 3 (three) times daily as needed.  . metoprolol  (TOPROL -XL) 100 MG 24 hr tablet Take 1 tablet (100 mg total) by mouth daily.  . multivitamin capsule Take 1 capsule by mouth daily.  SABRA omeprazole (PRILOSEC) 20 MG capsule Take 1 capsule (20 mg total) by mouth daily PRN.  . sacubitriL-valsartan (ENTRESTO) 24-26 mg tablet Take 1 tablet by mouth 2 (two) times daily.  . sertraline (ZOLOFT) 50 MG tablet Take 1 tablet (50 mg total) by mouth daily.  . tamsulosin  (FLOMAX ) 0.4 mg Cap Take 1 capsule (0.4 mg total) by mouth nightly.  .  traZODone  (DESYREL ) 50 MG tablet Take 1 tablet (50 mg total) by mouth nightly as needed.    Allergies Patient has No Known Allergies.  Social History Patient  reports that he has been smoking cigars. He has never used smokeless tobacco. He reports that he does not currently use alcohol. He reports current drug use. Drug: Other-see comments.  Family History Family History  Problem Relation Age of Onset  . Stroke Mother   . Stroke Father     Review of Systems CONSTITUTIONAL: No night sweats. No malaise, lethargy. No fever or chills.  HEENT: Eyes: No visual changes. No eye pain or discharge. No epistaxis. No sinus pain. No sore throat. No odynophagia.  BREASTS: No soreness, lumps, or discharge.  RESPIRATORY: No coughing with purulent sputum. No wheeze. No hemoptysis.   GASTROINTESTINAL: No abdominal pain. No nausea or vomiting. No diarrhea or constipation. No hematemesis. No hematochezia. No melena.  GENITOURINARY: No urgency. No frequency. No dysuria. No hematuria.  MUSCULOSKELETAL: No musculoskeletal pain. No joint swelling.  NEUROLOGICAL: No confusion.  No headache or neck pain. No seizure. No focal weakness or sensory deficits. PSYCHIATRIC:  No hallucinations.  SKIN: No wounds.  ENDOCRINE: No unexplained weight loss. No polydipsia. No polyuria. No polyphagia.  HEMATOLOGIC: No anemia. No purpura. No petechiae. No prolonged or excessive bleeding.  ALLERGIC AND IMMUNOLOGIC: No pruritus. No swelling.    Objective:   BP 105/60   Pulse 87   Resp 16   Ht 172.7 cm (68)   Wt 83.9 kg (185 lb) Comment: pt  stated he weighs 177lbs at home  SpO2 94%   BMI 28.13 kg/m     Physical Exam:  VITAL SIGNS: Reviewed. GENERAL: The patient appeared to be in no distress. Alert and oriented.  HEENT: Atraumatic and normocephalic. Eyes: Extraocular muscles were intact. Anicteric. Pupils were equally reactive to light. Throat: There was no thrush, no exudate, no erythema. There was no evidence of  gum bleeding.  NECK: mild JVD  CHEST: Normal chest without pectus excavatum. BREASTS: There was no gynecomastia.  HEART: PMI nondisplaced.  Regular rate, regular rhythm.  Normal S1.  Normal S2.  No S3.  No S4.  No rubs.  No murmur.  LUNGS: mild left basilar crackles.  ABDOMEN: Soft and nontender.  No hepatosplenomegaly.  Bowel sounds were normal. RECTAL: Not performed MUSCULOSKELETAL: No peripheral edema. PERIPHERAL PULSES:  Upper--> Right radial 2+, Left radial 2+  Lower--> Right PT 2+ and DP 2+, Left PT 2+ and DP 2+ SKIN: No bruising.  No petechiae.  No ulceration. NEUROLOGICAL: Grossly normal without focal deficits.  Full range of motion in all the extremities.    EKG: Apr2024 NSR RBBB with bigeminy. Normal axis and intervals. No ST or Twave abnormalities.  8Oct2024 AF with RBBB/LAFB, occasional PVCs   Pertinent Labs: Lab Results  Name Value Date/Time   White Blood Cell Count 8.0 12/30/2022 0517   White Blood Cell Count 6.4 09/09/2022 1312   Red Blood Cell Count 4.90 12/30/2022 0517   Red Blood Cell Count 4.86 09/09/2022 1312   Hemoglobin 15.3 12/30/2022 0517   Hemoglobin 15.4 09/09/2022 1312   Hematocrit 46.7 12/30/2022 0517   Hematocrit 46.7 09/09/2022 1312   MCV 95.3 12/30/2022 0517   MCV 96.1 09/09/2022 1312   MCH 31.2 12/30/2022 0517   MCH 31.7 09/09/2022 1312   MCHC 32.8 12/30/2022 0517   MCHC 33.0 09/09/2022 1312   Platelet Count 222 12/30/2022 0517   Platelet Count 183 09/09/2022 1312   Segmented Neutrophils 61.6 12/30/2022 0517   SEGMENTED NEUTROPHILS 56 09/09/2022 1312   LYMPHOCYTES 31 09/09/2022 1312   Lymphocytes 21.2 12/30/2022 0517   Eosinophils 4.9 12/30/2022 0517   Basophils 1.0 12/30/2022 0517   BASOPHILS 1 09/09/2022 1312   Segmented Neutrophils, Absolute 4.95 12/30/2022 0517   Lymphocytes, Absolute 1.70 12/30/2022 0517   LYMPHS ABS 1.98 09/09/2022 1312   Monocytes, Absolute 0.87 12/30/2022 0517   MONOS ABS 0.64 09/09/2022 1312   Eosinophils,  Absolute 0.39 12/30/2022 0517   EOSINO ABS 0.19 09/09/2022 1312   Basophils, Absolute 0.08 12/30/2022 0517   BASOS ABS 0.06 09/09/2022 1312     Glucose  Date/Time Value Ref Range Status  12/30/2022 0517 119 (H) 70 - 99 mg/dL Final  93/80/7975 8687 106 (H) 70 - 99 mg/dL Final   Blood Urea Nitrogen (BUN)  Date/Time Value Ref Range Status  09/09/2022 1312 24 (H) 8 - 23 mg/dL Final   BUN  Date/Time Value Ref Range Status  12/30/2022 0517 24 (H) 8 - 23 mg/dL Final   Creatinine  Date/Time Value Ref Range Status  12/30/2022 0517 1.74 (H) 0.67 - 1.17 mg/dL Final  93/80/7975 8687 1.34 (H) 0.67 - 1.17 mg/dL Final  94/85/7975 8542 1.6 (H) 0.7 - 1.2 mg/dL Final    Comment:    Performed and reviewed by point of care operator.   Sodium  Date/Time Value Ref Range Status  12/30/2022 0517 137 136 - 145 meq/L Final  09/09/2022 1312 137 136 - 145 meq/L Final   Potassium  Date/Time Value  Ref Range Status  12/30/2022 0517 3.6 3.5 - 5.1 meq/L Final  09/09/2022 1312 4.4 3.5 - 5.1 meq/L Final   Chloride  Date/Time Value Ref Range Status  12/30/2022 0517 97 (L) 98 - 107 meq/L Final  09/09/2022 1312 103 98 - 107 meq/L Final   Carbon Dioxide  Date/Time Value Ref Range Status  12/30/2022 0517 27 22 - 29 meq/L Final  09/09/2022 1312 26 22 - 29 meq/L Final   Calcium  Date/Time Value Ref Range Status  12/30/2022 0517 8.8 8.8 - 10.2 mg/dL Final  93/80/7975 8687 8.7 (L) 8.8 - 10.2 mg/dL Final   Lab Results  Name Value Date/Time   Cholesterol, Total 144 12/22/2022 1334   Triglycerides 129 12/22/2022 1334   HDL Cholesterol 50 12/22/2022 1334   LDL Cholesterol 85 12/22/2022 1334    Hemoglobin A1c  Date/Time Value Ref Range Status  12/22/2022 1245 5.7 (H) 3.8 - 5.6 % Final    Comment:    Diabetes: HgbA1c of 6.5% or greater (should be confirmed with a follow-up test) There is not a standard for using HgbA1c to diagnose diabetes in children   Pre-Diabetes: HgbA1c 5.7%-6.4%  HgbA1c  Goal: 7.0% or less (your goal may be different based on how long you have had diabetes, your age, or other medical conditions)  Note: This test does not detect hemoglobin variants, hemolytic disease, or significant blood loss, which may interfere with the accuracy of the HgbA1c test, especially when homozygous variants are present. If clinically indicated, alternative HgbA1c methods such as HPLC, electrophoresis, or fructosamine are available.     Troponin & BNP:  Lab Results  Name Value Date/Time   TROPONIN T, HS 35 07/06/2022 1700   Troponin T, High-Sensitivity 43 12/29/2022 0611   Troponin T, High-Sensitivity 39 12/28/2022 1213   N-Terminal Pro B-Type Natriuretic Peptide (NT-proBNP) 12,302 (H) 12/28/2022 1213    Assessment & Plan:  1. CVA (cerebral vascular accident) [facility]) Management per neurology.  On aspirin  81mg  daily.   2. HTN (hypertension): uncontrolled Management based on nonischemic cardiomyopathy and heart failure.   3. Paroxysmal atrial fibrillation (HCC) CHADS2VASC is 6, corresponding with >9% risk of stroke  Continued high fall risk.  Underwent defibrillator placement summer 2024 then 3 month recovery.  Due to fall risk, he received Amulet LAA closure early Oct2024.  No longer on Eliquis .  Plan: DAPT for 3 months, seal confirmation with TEE early 2025, then single antiplatelet therapy thereafter   4. CAD (coronary artery disease) Mild-to-moderate diffuse CAD by heart catheterization in 2022.   5. Carotid stenosis, bilateral CTA head and neck with moderate stenosis.  A carotid ultrasound will be due in 2025.   6. Chronic kidney disease Per primary care/nephrology.  Continue Jardiance  (DAPA-CKD indication).   7. Endocarditis of mitral valve Reportedly healed endocarditis noted in office visit Jan2023 Will need prophylaxis for dental work   8. Acute on chronic systolic CHF Euvolemic.  Stopped lasix  [id number] as he was felt to be overmedicated and periodically had  low BP.   Recurrent CHF hospitalization with volume overload (01/04/23) post amulet procedure and in the setting of increased AF burden. due to inadequate diuretic therapy and worsened by increased AF burden since early Oct2024. Overall symptoms have improved since discharge and resumption of diuretics, mildly hypervolemic today and has mild orthopnea. Continue Lasix  20 mg BID x 1 month, can decrease to 20 mg once daily after this for maintenance therapy. Follow up in 2 months to assess volume status. Heart failure  precautions discussed at length.    9. Nonischemic dilated cardiomyopathy Borderline LV dilation.  Persistent LVEF <35% despite appropriate GDMT.  St Jude ICD functioning normally.  Change losartan  to [facility] as of 7Jun2024.     10. Aortic stenosis The TAVR valve is functioning normally.  A follow-up echocardiogram will be due in mid 2025   11. Bigeminy Previous overall PVC burden was not known.  Following high-dose beta-blocker and ICD implantation, PVC burden initially around 5% as of Aug2024.  This is acceptable and will be monitored closely.   12. Sick sinus syndrome The St Jude ICD is functioning normally, but due to exertional fatigue, changed his rate response Aug2024.   13. Hyperlipidemia Stop zetia  - no mortality benefit mid 2024.   Complex patient for whom I have an ongoing / continuous relationship and manage many chronic cardiovascular conditions and related conditions.  Management will be as described above.

## 2023-01-26 ENCOUNTER — Telehealth: Payer: Self-pay | Admitting: Physician Assistant

## 2023-01-26 NOTE — Telephone Encounter (Addendum)
  [facility]   MULTIDISCIPLINARY HEART VALVE TEAM    Patient underwent TAVR on 02/10/22. He was due to come see [place] on 01/27/23 but pt has since moved to [place] and followed at [facility] and [facility]. He had a limited echo in October 2024 in [facility], but there is no data on the aortic valve. He sees his cardiologist tomorrow and will tell them he is due for his 1 year TAVR echo assessment. Unfortunately, he has had issues with CHF and a recent admission and has NYHA class III symptoms. KCCQ completed below.   [place] Cardiomyopathy Questionnaire     01/26/2023   12:33 PM 03/20/2022   11:13 AM 12/15/2021    1:36 PM  KCCQ-12  1 a. Ability to shower/bathe Moderately limited Moderately limited Not at all limited  1 b. Ability to walk 1 block Other, Did not do Moderately limited Not at all limited  1 c. Ability to hurry/jog Other, Did not do Moderately limited Not at all limited  2. Edema feet/ankles/legs 3+ times a week, not every day Never over the past 2 weeks Never over the past 2 weeks  3. Limited by fatigue All of the time 3+ times per week, not every day 1-2 times a week  4. Limited by dyspnea All of the time At least once a day 1-2 times a week  5. Sitting up / on 3+ pillows 3+ times a week, not every day Never over the past 2 weeks Never over the past 2 weeks  6. Limited enjoyment of life Limited quite a bit Slightly limited Not limited at all  7. Rest of life w/ symptoms Mostly dissatisfied Mostly satisfied Completely satisfied  8 a. Participation in hobbies Moderately limited Limited quite a bit Did not limit at all  8 b. Participation in chores Slightly limited N/A, did not do for other reasons Did not limit at all  8 c. Visiting family/friends Slightly limited Slightly limited Did not limit at all    Cline Crock PA-C  [facility]

## 2023-01-27 ENCOUNTER — Ambulatory Visit: Payer: PPO

## 2023-01-27 ENCOUNTER — Ambulatory Visit (HOSPITAL_COMMUNITY): Payer: PPO
